# Patient Record
Sex: Female | Born: 1945 | Race: Black or African American | Hispanic: No | Smoking: Never smoker
Health system: Southern US, Community
[De-identification: ages and names within clinical notes are randomized; demographics above are authoritative.]

## PROBLEM LIST (undated history)

## (undated) DIAGNOSIS — I10 Essential (primary) hypertension: Secondary | ICD-10-CM

## (undated) DIAGNOSIS — R519 Headache, unspecified: Secondary | ICD-10-CM

## (undated) DIAGNOSIS — C801 Malignant (primary) neoplasm, unspecified: Secondary | ICD-10-CM

## (undated) DIAGNOSIS — I340 Nonrheumatic mitral (valve) insufficiency: Secondary | ICD-10-CM

## (undated) DIAGNOSIS — E039 Hypothyroidism, unspecified: Secondary | ICD-10-CM

## (undated) DIAGNOSIS — N183 Chronic kidney disease, stage 3 unspecified: Secondary | ICD-10-CM

## (undated) DIAGNOSIS — Z85528 Personal history of other malignant neoplasm of kidney: Secondary | ICD-10-CM

## (undated) DIAGNOSIS — I272 Pulmonary hypertension, unspecified: Secondary | ICD-10-CM

## (undated) DIAGNOSIS — R51 Headache: Secondary | ICD-10-CM

## (undated) DIAGNOSIS — I43 Cardiomyopathy in diseases classified elsewhere: Secondary | ICD-10-CM

## (undated) DIAGNOSIS — R0600 Dyspnea, unspecified: Secondary | ICD-10-CM

## (undated) DIAGNOSIS — E854 Organ-limited amyloidosis: Secondary | ICD-10-CM

## (undated) DIAGNOSIS — I639 Cerebral infarction, unspecified: Secondary | ICD-10-CM

## (undated) DIAGNOSIS — I5042 Chronic combined systolic (congestive) and diastolic (congestive) heart failure: Secondary | ICD-10-CM

## (undated) DIAGNOSIS — Z9889 Other specified postprocedural states: Secondary | ICD-10-CM

## (undated) DIAGNOSIS — M1990 Unspecified osteoarthritis, unspecified site: Secondary | ICD-10-CM

## (undated) DIAGNOSIS — E7800 Pure hypercholesterolemia, unspecified: Secondary | ICD-10-CM

## (undated) HISTORY — DX: Chronic combined systolic (congestive) and diastolic (congestive) heart failure: I50.42

## (undated) HISTORY — DX: Pulmonary hypertension, unspecified: I27.20

## (undated) HISTORY — PX: NEPHRECTOMY: SHX65

## (undated) HISTORY — PX: KNEE ARTHROSCOPY: SUR90

## (undated) HISTORY — PX: BREAST EXCISIONAL BIOPSY: SUR124

## (undated) HISTORY — DX: Organ-limited amyloidosis: I43

## (undated) HISTORY — DX: Other specified postprocedural states: Z98.890

## (undated) HISTORY — DX: Headache: R51

## (undated) HISTORY — DX: Organ-limited amyloidosis: E85.4

## (undated) HISTORY — DX: Nonrheumatic mitral (valve) insufficiency: I34.0

## (undated) HISTORY — DX: Headache, unspecified: R51.9

---

## 1998-12-11 ENCOUNTER — Other Ambulatory Visit: Admission: RE | Admit: 1998-12-11 | Discharge: 1998-12-11 | Payer: Self-pay | Admitting: *Deleted

## 1999-08-09 ENCOUNTER — Emergency Department (HOSPITAL_COMMUNITY): Admission: EM | Admit: 1999-08-09 | Discharge: 1999-08-09 | Payer: Self-pay | Admitting: Emergency Medicine

## 1999-08-09 ENCOUNTER — Encounter: Payer: Self-pay | Admitting: Emergency Medicine

## 1999-12-11 ENCOUNTER — Encounter: Payer: Self-pay | Admitting: Emergency Medicine

## 1999-12-11 ENCOUNTER — Emergency Department (HOSPITAL_COMMUNITY): Admission: EM | Admit: 1999-12-11 | Discharge: 1999-12-11 | Payer: Self-pay | Admitting: Emergency Medicine

## 2001-01-31 ENCOUNTER — Encounter: Admission: RE | Admit: 2001-01-31 | Discharge: 2001-02-17 | Payer: Self-pay | Admitting: Orthopedic Surgery

## 2002-10-12 ENCOUNTER — Emergency Department (HOSPITAL_COMMUNITY): Admission: EM | Admit: 2002-10-12 | Discharge: 2002-10-12 | Payer: Self-pay | Admitting: Emergency Medicine

## 2004-02-01 ENCOUNTER — Inpatient Hospital Stay (HOSPITAL_COMMUNITY): Admission: AD | Admit: 2004-02-01 | Discharge: 2004-02-01 | Payer: Self-pay | Admitting: Obstetrics & Gynecology

## 2005-08-15 ENCOUNTER — Emergency Department (HOSPITAL_COMMUNITY): Admission: EM | Admit: 2005-08-15 | Discharge: 2005-08-16 | Payer: Self-pay | Admitting: Emergency Medicine

## 2005-09-13 ENCOUNTER — Encounter (INDEPENDENT_AMBULATORY_CARE_PROVIDER_SITE_OTHER): Payer: Self-pay | Admitting: Specialist

## 2005-09-13 ENCOUNTER — Ambulatory Visit (HOSPITAL_COMMUNITY): Admission: RE | Admit: 2005-09-13 | Discharge: 2005-09-13 | Payer: Self-pay | Admitting: Gastroenterology

## 2005-09-29 ENCOUNTER — Encounter (INDEPENDENT_AMBULATORY_CARE_PROVIDER_SITE_OTHER): Payer: Self-pay | Admitting: Specialist

## 2005-09-29 ENCOUNTER — Inpatient Hospital Stay (HOSPITAL_COMMUNITY): Admission: RE | Admit: 2005-09-29 | Discharge: 2005-10-01 | Payer: Self-pay | Admitting: Urology

## 2006-09-05 ENCOUNTER — Ambulatory Visit (HOSPITAL_COMMUNITY): Admission: RE | Admit: 2006-09-05 | Discharge: 2006-09-05 | Payer: Self-pay | Admitting: Urology

## 2007-07-23 ENCOUNTER — Emergency Department (HOSPITAL_COMMUNITY): Admission: EM | Admit: 2007-07-23 | Discharge: 2007-07-23 | Payer: Self-pay | Admitting: Emergency Medicine

## 2007-08-18 ENCOUNTER — Emergency Department (HOSPITAL_COMMUNITY): Admission: EM | Admit: 2007-08-18 | Discharge: 2007-08-18 | Payer: Self-pay | Admitting: Emergency Medicine

## 2007-08-31 ENCOUNTER — Other Ambulatory Visit: Admission: RE | Admit: 2007-08-31 | Discharge: 2007-08-31 | Payer: Self-pay | Admitting: Internal Medicine

## 2007-09-07 ENCOUNTER — Ambulatory Visit (HOSPITAL_COMMUNITY): Admission: RE | Admit: 2007-09-07 | Discharge: 2007-09-07 | Payer: Self-pay | Admitting: Urology

## 2007-09-11 ENCOUNTER — Ambulatory Visit (HOSPITAL_COMMUNITY): Admission: RE | Admit: 2007-09-11 | Discharge: 2007-09-11 | Payer: Self-pay | Admitting: Internal Medicine

## 2007-10-12 ENCOUNTER — Ambulatory Visit (HOSPITAL_BASED_OUTPATIENT_CLINIC_OR_DEPARTMENT_OTHER): Admission: RE | Admit: 2007-10-12 | Discharge: 2007-10-12 | Payer: Self-pay | Admitting: Orthopedic Surgery

## 2008-04-01 ENCOUNTER — Emergency Department (HOSPITAL_COMMUNITY): Admission: EM | Admit: 2008-04-01 | Discharge: 2008-04-01 | Payer: Self-pay | Admitting: Family Medicine

## 2008-04-02 ENCOUNTER — Ambulatory Visit: Payer: Self-pay | Admitting: Surgery

## 2008-04-02 ENCOUNTER — Encounter (INDEPENDENT_AMBULATORY_CARE_PROVIDER_SITE_OTHER): Payer: Self-pay | Admitting: Emergency Medicine

## 2008-04-02 ENCOUNTER — Ambulatory Visit (HOSPITAL_COMMUNITY): Admission: RE | Admit: 2008-04-02 | Discharge: 2008-04-02 | Payer: Self-pay | Admitting: Emergency Medicine

## 2008-05-02 ENCOUNTER — Encounter: Admission: RE | Admit: 2008-05-02 | Discharge: 2008-05-02 | Payer: Self-pay | Admitting: Internal Medicine

## 2008-05-17 ENCOUNTER — Ambulatory Visit (HOSPITAL_COMMUNITY): Admission: RE | Admit: 2008-05-17 | Discharge: 2008-05-17 | Payer: Self-pay | Admitting: Internal Medicine

## 2008-09-11 ENCOUNTER — Ambulatory Visit (HOSPITAL_COMMUNITY): Admission: RE | Admit: 2008-09-11 | Discharge: 2008-09-11 | Payer: Self-pay | Admitting: Internal Medicine

## 2008-09-20 ENCOUNTER — Encounter: Admission: RE | Admit: 2008-09-20 | Discharge: 2008-09-20 | Payer: Self-pay | Admitting: Internal Medicine

## 2009-03-07 ENCOUNTER — Encounter: Admission: RE | Admit: 2009-03-07 | Discharge: 2009-03-07 | Payer: Self-pay | Admitting: Internal Medicine

## 2009-06-22 ENCOUNTER — Emergency Department (HOSPITAL_COMMUNITY): Admission: EM | Admit: 2009-06-22 | Discharge: 2009-06-22 | Payer: Self-pay | Admitting: Family Medicine

## 2009-06-22 ENCOUNTER — Emergency Department (HOSPITAL_COMMUNITY): Admission: EM | Admit: 2009-06-22 | Discharge: 2009-06-22 | Payer: Self-pay | Admitting: Emergency Medicine

## 2009-06-27 ENCOUNTER — Emergency Department (HOSPITAL_COMMUNITY): Admission: EM | Admit: 2009-06-27 | Discharge: 2009-06-27 | Payer: Self-pay | Admitting: Emergency Medicine

## 2009-09-04 ENCOUNTER — Ambulatory Visit (HOSPITAL_COMMUNITY): Admission: RE | Admit: 2009-09-04 | Discharge: 2009-09-04 | Payer: Self-pay | Admitting: Urology

## 2009-09-19 ENCOUNTER — Encounter: Admission: RE | Admit: 2009-09-19 | Discharge: 2009-09-19 | Payer: Self-pay | Admitting: Internal Medicine

## 2010-01-07 ENCOUNTER — Emergency Department (HOSPITAL_COMMUNITY): Admission: EM | Admit: 2010-01-07 | Discharge: 2010-01-07 | Payer: Self-pay | Admitting: Family Medicine

## 2010-07-23 ENCOUNTER — Other Ambulatory Visit
Admission: RE | Admit: 2010-07-23 | Discharge: 2010-07-23 | Payer: Self-pay | Source: Home / Self Care | Admitting: Internal Medicine

## 2010-08-20 ENCOUNTER — Other Ambulatory Visit (HOSPITAL_COMMUNITY): Payer: Self-pay | Admitting: Internal Medicine

## 2010-08-20 DIAGNOSIS — Z1231 Encounter for screening mammogram for malignant neoplasm of breast: Secondary | ICD-10-CM

## 2010-09-08 ENCOUNTER — Ambulatory Visit (HOSPITAL_COMMUNITY)
Admission: RE | Admit: 2010-09-08 | Discharge: 2010-09-08 | Disposition: A | Discharge status: Home / Self Care | Payer: Medicare Other | Source: Ambulatory Visit | Attending: Urology | Admitting: Urology

## 2010-09-08 ENCOUNTER — Other Ambulatory Visit (HOSPITAL_COMMUNITY): Payer: Self-pay | Admitting: Urology

## 2010-09-08 DIAGNOSIS — C649 Malignant neoplasm of unspecified kidney, except renal pelvis: Secondary | ICD-10-CM | POA: Insufficient documentation

## 2010-09-21 ENCOUNTER — Ambulatory Visit (HOSPITAL_COMMUNITY)
Admission: RE | Admit: 2010-09-21 | Discharge: 2010-09-21 | Disposition: A | Discharge status: Home / Self Care | Payer: Medicare Other | Source: Ambulatory Visit | Attending: Internal Medicine | Admitting: Internal Medicine

## 2010-09-21 DIAGNOSIS — Z1231 Encounter for screening mammogram for malignant neoplasm of breast: Secondary | ICD-10-CM | POA: Insufficient documentation

## 2010-11-04 LAB — URINALYSIS, ROUTINE W REFLEX MICROSCOPIC
Bilirubin Urine: NEGATIVE
Bilirubin Urine: NEGATIVE
Glucose, UA: NEGATIVE mg/dL
Glucose, UA: NEGATIVE mg/dL
Ketones, ur: NEGATIVE mg/dL
Ketones, ur: NEGATIVE mg/dL
Nitrite: NEGATIVE
Nitrite: NEGATIVE
Protein, ur: NEGATIVE mg/dL
Protein, ur: NEGATIVE mg/dL
Specific Gravity, Urine: 1.013 (ref 1.005–1.030)
Specific Gravity, Urine: 1.016 (ref 1.005–1.030)
Urobilinogen, UA: 0.2 mg/dL (ref 0.0–1.0)
Urobilinogen, UA: 1 mg/dL (ref 0.0–1.0)
pH: 6 (ref 5.0–8.0)
pH: 6.5 (ref 5.0–8.0)

## 2010-11-04 LAB — DIFFERENTIAL
Basophils Absolute: 0 10*3/uL (ref 0.0–0.1)
Basophils Absolute: 0.1 10*3/uL (ref 0.0–0.1)
Basophils Relative: 1 % (ref 0–1)
Basophils Relative: 1 % (ref 0–1)
Eosinophils Absolute: 0 10*3/uL (ref 0.0–0.7)
Eosinophils Absolute: 0 10*3/uL (ref 0.0–0.7)
Eosinophils Relative: 0 % (ref 0–5)
Eosinophils Relative: 0 % (ref 0–5)
Lymphocytes Relative: 28 % (ref 12–46)
Lymphocytes Relative: 36 % (ref 12–46)
Lymphs Abs: 1.6 10*3/uL (ref 0.7–4.0)
Lymphs Abs: 2 10*3/uL (ref 0.7–4.0)
Monocytes Absolute: 0.3 10*3/uL (ref 0.1–1.0)
Monocytes Absolute: 0.3 10*3/uL (ref 0.1–1.0)
Monocytes Relative: 5 % (ref 3–12)
Monocytes Relative: 6 % (ref 3–12)
Neutro Abs: 3.2 10*3/uL (ref 1.7–7.7)
Neutro Abs: 3.7 10*3/uL (ref 1.7–7.7)
Neutrophils Relative %: 58 % (ref 43–77)
Neutrophils Relative %: 65 % (ref 43–77)

## 2010-11-04 LAB — URINE CULTURE: Colony Count: 30000

## 2010-11-04 LAB — CBC
HCT: 42.1 % (ref 36.0–46.0)
HCT: 45.5 % (ref 36.0–46.0)
Hemoglobin: 14.5 g/dL (ref 12.0–15.0)
Hemoglobin: 15.7 g/dL — ABNORMAL HIGH (ref 12.0–15.0)
MCHC: 34.4 g/dL (ref 30.0–36.0)
MCHC: 34.5 g/dL (ref 30.0–36.0)
MCV: 91.2 fL (ref 78.0–100.0)
MCV: 91.4 fL (ref 78.0–100.0)
Platelets: 230 10*3/uL (ref 150–400)
Platelets: 306 10*3/uL (ref 150–400)
RBC: 4.62 MIL/uL (ref 3.87–5.11)
RBC: 4.98 MIL/uL (ref 3.87–5.11)
RDW: 13.3 % (ref 11.5–15.5)
RDW: 13.4 % (ref 11.5–15.5)
WBC: 5.6 10*3/uL (ref 4.0–10.5)
WBC: 5.7 10*3/uL (ref 4.0–10.5)

## 2010-11-04 LAB — COMPREHENSIVE METABOLIC PANEL
ALT: 14 U/L (ref 0–35)
ALT: 16 U/L (ref 0–35)
AST: 19 U/L (ref 0–37)
AST: 19 U/L (ref 0–37)
Albumin: 3.9 g/dL (ref 3.5–5.2)
Albumin: 4.3 g/dL (ref 3.5–5.2)
Alkaline Phosphatase: 102 U/L (ref 39–117)
Alkaline Phosphatase: 95 U/L (ref 39–117)
BUN: 13 mg/dL (ref 6–23)
BUN: 14 mg/dL (ref 6–23)
CO2: 26 mEq/L (ref 19–32)
CO2: 29 mEq/L (ref 19–32)
Calcium: 9.4 mg/dL (ref 8.4–10.5)
Calcium: 9.4 mg/dL (ref 8.4–10.5)
Chloride: 106 mEq/L (ref 96–112)
Chloride: 108 mEq/L (ref 96–112)
Creatinine, Ser: 1.29 mg/dL — ABNORMAL HIGH (ref 0.4–1.2)
Creatinine, Ser: 1.38 mg/dL — ABNORMAL HIGH (ref 0.4–1.2)
GFR calc Af Amer: 47 mL/min — ABNORMAL LOW (ref >60)
GFR calc Af Amer: 50 mL/min — ABNORMAL LOW (ref >60)
GFR calc non Af Amer: 38 mL/min — ABNORMAL LOW (ref >60)
GFR calc non Af Amer: 42 mL/min — ABNORMAL LOW (ref >60)
Glucose, Bld: 86 mg/dL (ref 70–99)
Glucose, Bld: 96 mg/dL (ref 70–99)
Potassium: 3.6 mEq/L (ref 3.5–5.1)
Potassium: 4.3 mEq/L (ref 3.5–5.1)
Sodium: 139 mEq/L (ref 135–145)
Sodium: 142 mEq/L (ref 135–145)
Total Bilirubin: 1 mg/dL (ref 0.3–1.2)
Total Bilirubin: 1.1 mg/dL (ref 0.3–1.2)
Total Protein: 7.2 g/dL (ref 6.0–8.3)
Total Protein: 7.7 g/dL (ref 6.0–8.3)

## 2010-11-04 LAB — URINE MICROSCOPIC-ADD ON

## 2010-11-04 LAB — POCT URINALYSIS DIP (DEVICE)
Bilirubin Urine: NEGATIVE
Glucose, UA: NEGATIVE mg/dL
Ketones, ur: NEGATIVE mg/dL
Nitrite: NEGATIVE
Protein, ur: NEGATIVE mg/dL
Specific Gravity, Urine: 1.015 (ref 1.005–1.030)
Urobilinogen, UA: 0.2 mg/dL (ref 0.0–1.0)
pH: 5.5 (ref 5.0–8.0)

## 2010-11-04 LAB — LIPASE, BLOOD
Lipase: 34 U/L (ref 11–59)
Lipase: 42 U/L (ref 11–59)

## 2010-12-15 NOTE — Op Note (Signed)
[name], [name]                  ACCOUNT NO.:  [id number]   MEDICAL RECORD NO.:  [id number]          PATIENT TYPE:  AMB   LOCATION:  [place]                          FACILITY:  [facility]   PHYSICIAN:  Rodney A. Mortenson, M.D.DATE OF BIRTH:  [birth year]   DATE OF PROCEDURE:  10/12/2007  DATE OF DISCHARGE:                               OPERATIVE REPORT   INDICATIONS FOR PROCEDURE:  A 65 year old female with typical carpal  tunnel syndrome on the right and positive prolonged nerve conduction  studies.  Now admitted for release of carpal tunnel.  Questions were  answered and encouraged preoperatively.  Complications discussed.  The  patient wished to proceed with surgery.   PREOPERATIVE DIAGNOSIS:  Right carpal tunnel.   POSTOPERATIVE DIAGNOSIS:  Right carpal tunnel.   OPERATION:  Release of right carpal tunnel.   SURGEON:  Geroge Baseman. Alphonzo Cruise, M.D.   ANESTHESIA:  MAC.   PROCEDURE IN DETAIL:  The patient was placed on the operating table in a  supine position.  Pneumatic tourniquet was applied to the right upper  arm.  The right upper extremity was prepped with DuraPrep and draped out  in the usual manner.  The area of incision was infiltrated with a  combination of Marcaine and Xylocaine and then the hand and arm was  wrapped out with an Esmarch and tourniquet was elevated.  A lazy S  incision was made starting at mid palmar space and carried proximal to  the volar wrist crease.  The fascia in the forearm was then opened and  the median nerve was identified and isolated.  A curved Mayo scissors  was placed underneath the transverse carpal ligament but above the  median nerve to protect the median nerve.  Under direct vision an  incision was made through the transverse ligament starting proximally  and distally and a complete decompression of the transverse ligament was  achieved very nicely.  The carpal tunnel was then evaluated.  No other  space occupying lesions were seen.  All the  tendons and nerve appeared  normal.  There was no flattening of the nerve underneath the transverse  ligament and there was some loss of the vascular markings.  Skin edges  were then closed with interrupted 4-0 nylon sutures.  Large bulky  pressure dressing and wrist immobilizer applied and the patient returned  to recovery room in excellent condition.  Technically I was very pleased  with the surgical outcome.   DISPOSITION:  1. Return to my office on Monday.  2. Percocet for pain.      Rodney A. Alphonzo Cruise, M.D.  Electronically Signed    RAM/MEDQ  D:  10/12/2007  T:  10/13/2007  Job:  [id number]

## 2010-12-18 NOTE — Discharge Summary (Signed)
NAMEMarland [name]  SOFIA, MARCIN                  ACCOUNT NO.:  [id number]   MEDICAL RECORD NO.:  [id number]          PATIENT TYPE:  INP   LOCATION:  62                         FACILITY:  [facility]   PHYSICIAN:  Mark C. Karsten Ro, M.D.  DATE OF BIRTH:  1945/09/26   DATE OF ADMISSION:  09/29/2005  DATE OF DISCHARGE:  10/01/2005                                 DISCHARGE SUMMARY   PRINCIPAL DIAGNOSIS:  Right renal mass poor.   OTHER DIAGNOSES:  None.   MAJOR OPERATION:  Right radical nephrectomy.   DISPOSITION:  The patient is discharged home in a stable, satisfactory and  improved condition. She is tolerating a regular diet, afebrile and her  incision appears to be healing well without any signs of infection or  discharge.   DISCHARGE MEDICATIONS:  1.  Tylox, 1-2 q.4 h p.r.n. pain #40.  2.  Colace 100 mg b.i.d.   DIET:  Unrestricted.   ACTIVITY:  Will be limited to no heavy lifting, straining and no driving,  avoiding up and down multiple stairs and primarily just activity of daily  living.   FOLLOW-UP:  Will be in my office in 1 week for skin staple removal and  creatinine check.   BRIEF HISTORY:  The patient is a 65 year old black female who had  incidentally identified 4-1/2 cm hypervascular right renal mass on CT scan.  She had normal contralateral renal function. The lesion appeared to involve  a significant portion of the kidney necessitating total nephrectomy rather  than a partial nephrectomy. She was admitted for that procedure at this  time. Her full history and physical is noted in her hospital chart will not  be redictated here.   HOSPITAL COURSE:  On September 29, 2005, she was taken to the operating room  and underwent a right radical nephrectomy without complication. She did well  over the evening and the following day was noted to be without complaint.  She was afebrile, her creatinine was noted to be slightly elevated at 1.6,  hemoglobin 11.7, hematocrit 34.7. Her Foley  catheter was removed. She was  begun on ambulation and her diet was advanced. By the following day, she was  tolerating a regular diet, had no complaint of any significant pain. Her  incision was healing well without any sign of infection and was felt ready  for discharge.   The pathology report remains pending at the time of discharge. She will be  followed up in my office as noted above. I will discuss pathology results  with her at that time.      Mark C. Karsten Ro, M.D.  Electronically Signed     MCO/MEDQ  D:  10/01/2005  T:  10/01/2005  Job:  [id number]

## 2010-12-18 NOTE — Op Note (Signed)
NAMEMarland Kitchen  Katie, [name]                  ACCOUNT NO.:  [id number]   MEDICAL RECORD NO.:  [id number]          PATIENT TYPE:  INP   LOCATION:  30                         FACILITY:  [facility]   PHYSICIAN:  Mark C. Karsten Ro, M.D.  DATE OF BIRTH:  1946-04-30   DATE OF PROCEDURE:  09/29/2005  DATE OF DISCHARGE:                                 OPERATIVE REPORT   PREOPERATIVE DIAGNOSIS:  Right renal mass.   POSTOPERATIVE DIAGNOSIS:  Right renal mass.   PROCEDURE:  Right radical nephrectomy.   SURGEON:  Mark C. Karsten Ro, M.D.   ASSISTANT:  Guillermina City, M.D.   ANESTHESIA:  General endotracheal.   SPECIMENS:  Kidney with partial ureterectomy and surrounding Gerota's  fascia.   DRAINS:  None.   PROCEDURE:  The patient was identified by a wrist bracelet and brought to  room 4 where she received preprocedure antibiotics and was administered  general anesthesia. She was prepped and draped in the usual sterile fashion  taking great care to minimize the risk of peripheral neuropathy or  compartment syndrome. Next, we palpated her right costal margin, palpated  the eleventh rib as well as the costal margin. As noted on preoperative  imaging, the patient had a very short twelfth free rib. It was then  determined to make an incision off the tip of this rib towards her umbilicus  approximately 20 cm in length. We carried down with the knife to the fascial  sheath. Bovie cautery was used to control hemostasis. We then opened the  fascia and the muscle body and the external oblique. We continued down  opening the fascia and muscle body and the internal oblique and then bluntly  split the transversus abdominis. We then set our Bookwalter retractor. We  then bluntly dissected the peritoneum medially to expose Gerota's fascia. We  then dissected a combination of blunt and sharp dissection posteriorly down  onto the psoas musculature. Next, using a combination of blunt and sharp  dissection, we dissected  Gerota's fascia inferiorly. We isolated the ureter,  it was ligated with metal clips and divided sharply. Next, we followed the  ureter up medially towards the hilum, we palpated the hilum. We were able to  identify the artery and vein. Next, we ligated and divided each structure  ligating and dividing the artery first. We placed one #0 tie distal and one  #0 silk tie and two large metal clips proximal on both the artery and the  vein and they were then divided. We then noted the kidney softened markedly  and turned blue. There were no other hilar structures appreciated. We did  then free the superior pole leaving an adequate amount of Gerota's fascia.  This was done with a combination of blunt and sharp dissection using short  metal clips and Bovie cautery to ligate any small vessels in Gerota's  fascia. We next removed the specimen from the field. We copiously irrigated  the wound, we inspected the hilum and it was noted to be markedly  hemostatic. There were no bleeding points. We did trace the gonadal which we  did not ligate or divide. It was noted to enter in to the vena cava inferior  to where we divided the ureter. Next, we took our retractor system down. We  took the flex and the kidney rest down on the bed. We closed the wound in 3  layers closing transversus and the  internal oblique with a running #1 PDS. We then closed the external oblique  with a running #1 PDS, skin staples were applied. The patient was reversed  from her anesthesia which she tolerated without complications. Please note  that Dr. Kathie Rhodes was present and participated in all aspects of this  case.     ______________________________  Guillermina City, MD      Thana Farr. Karsten Ro, M.D.  Electronically Signed    MT/MEDQ  D:  09/29/2005  T:  09/30/2005  Job:  [id number]

## 2011-04-26 LAB — POCT I-STAT, CHEM 8
BUN: 14
Calcium, Ion: 1.2
Chloride: 107
Creatinine, Ser: 1.4 — ABNORMAL HIGH
Glucose, Bld: 100 — ABNORMAL HIGH
HCT: 44
Hemoglobin: 15
Potassium: 4
Sodium: 144
TCO2: 26

## 2011-05-07 LAB — URINALYSIS, ROUTINE W REFLEX MICROSCOPIC
Bilirubin Urine: NEGATIVE
Glucose, UA: NEGATIVE
Ketones, ur: NEGATIVE
Nitrite: NEGATIVE
Protein, ur: NEGATIVE
Specific Gravity, Urine: 1.019
Urobilinogen, UA: 0.2
pH: 5.5

## 2011-05-07 LAB — URINE MICROSCOPIC-ADD ON

## 2011-08-16 ENCOUNTER — Other Ambulatory Visit (HOSPITAL_COMMUNITY): Payer: Self-pay | Admitting: Internal Medicine

## 2011-08-16 DIAGNOSIS — Z1231 Encounter for screening mammogram for malignant neoplasm of breast: Secondary | ICD-10-CM

## 2011-09-16 ENCOUNTER — Ambulatory Visit (HOSPITAL_COMMUNITY)
Admission: RE | Admit: 2011-09-16 | Discharge: 2011-09-16 | Disposition: A | Discharge status: Home / Self Care | Payer: Medicare Other | Source: Ambulatory Visit | Attending: Urology | Admitting: Urology

## 2011-09-16 ENCOUNTER — Other Ambulatory Visit (HOSPITAL_COMMUNITY): Payer: Self-pay | Admitting: Urology

## 2011-09-16 DIAGNOSIS — Z85528 Personal history of other malignant neoplasm of kidney: Secondary | ICD-10-CM

## 2011-09-23 ENCOUNTER — Ambulatory Visit (HOSPITAL_COMMUNITY)
Admission: RE | Admit: 2011-09-23 | Discharge: 2011-09-23 | Disposition: A | Discharge status: Home / Self Care | Payer: Medicare Other | Source: Ambulatory Visit | Attending: Internal Medicine | Admitting: Internal Medicine

## 2011-09-23 DIAGNOSIS — Z1231 Encounter for screening mammogram for malignant neoplasm of breast: Secondary | ICD-10-CM | POA: Insufficient documentation

## 2012-08-30 ENCOUNTER — Other Ambulatory Visit (HOSPITAL_COMMUNITY): Payer: Self-pay | Admitting: Internal Medicine

## 2012-08-30 DIAGNOSIS — Z1231 Encounter for screening mammogram for malignant neoplasm of breast: Secondary | ICD-10-CM

## 2012-09-19 ENCOUNTER — Other Ambulatory Visit (HOSPITAL_COMMUNITY): Payer: Self-pay | Admitting: Urology

## 2012-09-19 ENCOUNTER — Ambulatory Visit (HOSPITAL_COMMUNITY)
Admission: RE | Admit: 2012-09-19 | Discharge: 2012-09-19 | Disposition: A | Discharge status: Home / Self Care | Payer: Medicare Other | Source: Ambulatory Visit | Attending: Urology | Admitting: Urology

## 2012-09-19 DIAGNOSIS — Z85528 Personal history of other malignant neoplasm of kidney: Secondary | ICD-10-CM

## 2012-09-19 DIAGNOSIS — J984 Other disorders of lung: Secondary | ICD-10-CM | POA: Insufficient documentation

## 2012-09-19 DIAGNOSIS — D4959 Neoplasm of unspecified behavior of other genitourinary organ: Secondary | ICD-10-CM | POA: Insufficient documentation

## 2012-09-25 ENCOUNTER — Ambulatory Visit (HOSPITAL_COMMUNITY)
Admission: RE | Admit: 2012-09-25 | Discharge: 2012-09-25 | Disposition: A | Discharge status: Home / Self Care | Payer: Medicare Other | Source: Ambulatory Visit | Attending: Internal Medicine | Admitting: Internal Medicine

## 2012-09-25 DIAGNOSIS — Z1231 Encounter for screening mammogram for malignant neoplasm of breast: Secondary | ICD-10-CM | POA: Insufficient documentation

## 2013-02-19 ENCOUNTER — Other Ambulatory Visit (HOSPITAL_COMMUNITY)
Admission: RE | Admit: 2013-02-19 | Discharge: 2013-02-19 | Disposition: A | Discharge status: Home / Self Care | Payer: Medicare Other | Source: Ambulatory Visit | Attending: Internal Medicine | Admitting: Internal Medicine

## 2013-02-19 ENCOUNTER — Other Ambulatory Visit: Payer: Self-pay | Admitting: Internal Medicine

## 2013-02-19 DIAGNOSIS — Z01419 Encounter for gynecological examination (general) (routine) without abnormal findings: Secondary | ICD-10-CM | POA: Insufficient documentation

## 2013-03-16 ENCOUNTER — Other Ambulatory Visit: Payer: Self-pay | Admitting: Obstetrics and Gynecology

## 2013-04-03 ENCOUNTER — Encounter (HOSPITAL_COMMUNITY): Payer: Self-pay | Admitting: Pharmacist

## 2013-04-17 ENCOUNTER — Encounter (HOSPITAL_COMMUNITY)
Admission: RE | Admit: 2013-04-17 | Discharge: 2013-04-17 | Disposition: A | Discharge status: Home / Self Care | Payer: Medicare Other | Source: Ambulatory Visit | Attending: Obstetrics and Gynecology | Admitting: Obstetrics and Gynecology

## 2013-04-17 ENCOUNTER — Encounter (HOSPITAL_COMMUNITY): Payer: Self-pay

## 2013-04-17 DIAGNOSIS — Z01818 Encounter for other preprocedural examination: Secondary | ICD-10-CM | POA: Insufficient documentation

## 2013-04-17 DIAGNOSIS — Z01810 Encounter for preprocedural cardiovascular examination: Secondary | ICD-10-CM | POA: Insufficient documentation

## 2013-04-17 DIAGNOSIS — Z01812 Encounter for preprocedural laboratory examination: Secondary | ICD-10-CM | POA: Insufficient documentation

## 2013-04-17 LAB — CBC
HCT: 41.8 % (ref 36.0–46.0)
Hemoglobin: 14.3 g/dL (ref 12.0–15.0)
MCH: 30.7 pg (ref 26.0–34.0)
MCHC: 34.2 g/dL (ref 30.0–36.0)
MCV: 89.7 fL (ref 78.0–100.0)
Platelets: 279 10*3/uL (ref 150–400)
RBC: 4.66 MIL/uL (ref 3.87–5.11)
RDW: 13.6 % (ref 11.5–15.5)
WBC: 6.8 10*3/uL (ref 4.0–10.5)

## 2013-04-17 NOTE — Pre-Procedure Instructions (Signed)
EKG of today (04/17/13) reviewed and accepted by Rudean Curt, M.D.

## 2013-04-17 NOTE — Patient Instructions (Addendum)
20 [name]  04/17/2013   Your procedure is scheduled on:  04/23/13  Enter through the Main Entrance of [facility] at [place] up the phone at the desk and dial [phone].   Call this number if you have problems the morning of surgery: [phone]   Remember:   Do not eat food:After Midnight.  Do not drink clear liquids: 4 Hours before arrival.  Take these medicines the morning of surgery with A SIP OF WATER: NA   Do not wear jewelry, make-up or nail polish.  Do not wear lotions, powders, or perfumes. You may wear deodorant.  Do not shave 48 hours prior to surgery.  Do not bring valuables to the hospital.  [facility] is not   responsible for any belongings or valuables brought to the hospital.  Contacts, dentures or bridgework may not be worn into surgery.  Leave suitcase in the car. After surgery it may be brought to your room.  For patients admitted to the hospital, checkout time is 11:00 AM the day of              discharge.   Patients discharged the day of surgery will not be allowed to drive             home.  Name and phone number of your driver: sister   Katie [name]  Special Instructions:   Shower using CHG 2 nights before surgery and the night before surgery.  If you shower the day of surgery use CHG.  Use special wash - you have one bottle of CHG for all showers.  You should use approximately 1/3 of the bottle for each shower.   Please read over the following fact sheets that you were given:   Surgical Site Infection Prevention

## 2013-04-23 ENCOUNTER — Encounter (HOSPITAL_COMMUNITY): Payer: Self-pay | Admitting: *Deleted

## 2013-04-23 ENCOUNTER — Encounter (HOSPITAL_COMMUNITY): Payer: Self-pay | Admitting: Anesthesiology

## 2013-04-23 ENCOUNTER — Other Ambulatory Visit: Payer: Self-pay | Admitting: Obstetrics and Gynecology

## 2013-04-23 ENCOUNTER — Ambulatory Visit (HOSPITAL_COMMUNITY): Payer: Medicare Other | Admitting: Anesthesiology

## 2013-04-23 ENCOUNTER — Ambulatory Visit (HOSPITAL_COMMUNITY)
Admission: RE | Admit: 2013-04-23 | Discharge: 2013-04-23 | Disposition: A | Discharge status: Home / Self Care | Payer: Medicare Other | Source: Ambulatory Visit | Attending: Obstetrics and Gynecology | Admitting: Obstetrics and Gynecology

## 2013-04-23 ENCOUNTER — Encounter (HOSPITAL_COMMUNITY)
Admission: RE | Disposition: A | Discharge status: Home / Self Care | Payer: Self-pay | Source: Ambulatory Visit | Attending: Obstetrics and Gynecology

## 2013-04-23 DIAGNOSIS — N950 Postmenopausal bleeding: Secondary | ICD-10-CM | POA: Insufficient documentation

## 2013-04-23 HISTORY — PX: HYSTEROSCOPY W/D&C: SHX1775

## 2013-04-23 HISTORY — PX: CERVICAL POLYPECTOMY: SHX88

## 2013-04-23 LAB — BASIC METABOLIC PANEL
BUN: 17 mg/dL (ref 6–23)
CO2: 25 mEq/L (ref 19–32)
Calcium: 10.3 mg/dL (ref 8.4–10.5)
Chloride: 106 mEq/L (ref 96–112)
Creatinine, Ser: 1.36 mg/dL — ABNORMAL HIGH (ref 0.50–1.10)
GFR calc Af Amer: 46 mL/min — ABNORMAL LOW (ref >90)
GFR calc non Af Amer: 39 mL/min — ABNORMAL LOW (ref >90)
Glucose, Bld: 85 mg/dL (ref 70–99)
Potassium: 4.1 mEq/L (ref 3.5–5.1)
Sodium: 141 mEq/L (ref 135–145)

## 2013-04-23 SURGERY — DILATATION AND CURETTAGE /HYSTEROSCOPY
Anesthesia: General | Site: Uterus | Wound class: Clean Contaminated

## 2013-04-23 MED ORDER — MIDAZOLAM HCL 2 MG/2ML IJ SOLN
INTRAMUSCULAR | Status: AC
  Filled 2013-04-23: qty 2

## 2013-04-23 MED ORDER — FENTANYL CITRATE 0.05 MG/ML IJ SOLN
INTRAMUSCULAR | Status: DC | PRN
  Administered 2013-04-23 (×2): 50 ug via INTRAVENOUS

## 2013-04-23 MED ORDER — GLYCINE 1.5 % IR SOLN
Status: DC | PRN
  Administered 2013-04-23: 3000 mL

## 2013-04-23 MED ORDER — DOXYCYCLINE HYCLATE 50 MG PO CAPS: 100.0000 mg | ORAL_CAPSULE | Freq: Every day | ORAL | Status: DC

## 2013-04-23 MED ORDER — FENTANYL CITRATE 0.05 MG/ML IJ SOLN
INTRAMUSCULAR | Status: AC
  Filled 2013-04-23: qty 2

## 2013-04-23 MED ORDER — PROMETHAZINE HCL 25 MG/ML IJ SOLN: 6.2500 mg | INTRAMUSCULAR | Status: DC | PRN

## 2013-04-23 MED ORDER — KETOROLAC TROMETHAMINE 30 MG/ML IJ SOLN
INTRAMUSCULAR | Status: AC
  Filled 2013-04-23: qty 1

## 2013-04-23 MED ORDER — PROPOFOL 10 MG/ML IV EMUL
INTRAVENOUS | Status: AC
  Filled 2013-04-23: qty 20

## 2013-04-23 MED ORDER — MIDAZOLAM HCL 2 MG/2ML IJ SOLN: 0.5000 mg | Freq: Once | INTRAMUSCULAR | Status: DC | PRN

## 2013-04-23 MED ORDER — LIDOCAINE HCL (CARDIAC) 20 MG/ML IV SOLN
INTRAVENOUS | Status: DC | PRN
  Administered 2013-04-23: 50 mg via INTRAVENOUS

## 2013-04-23 MED ORDER — ONDANSETRON HCL 4 MG/2ML IJ SOLN
INTRAMUSCULAR | Status: AC
  Filled 2013-04-23: qty 2

## 2013-04-23 MED ORDER — LIDOCAINE HCL (CARDIAC) 20 MG/ML IV SOLN
INTRAVENOUS | Status: AC
  Filled 2013-04-23: qty 5

## 2013-04-23 MED ORDER — ONDANSETRON HCL 4 MG/2ML IJ SOLN
INTRAMUSCULAR | Status: DC | PRN
  Administered 2013-04-23: 4 mg via INTRAVENOUS

## 2013-04-23 MED ORDER — MIDAZOLAM HCL 5 MG/5ML IJ SOLN
INTRAMUSCULAR | Status: DC | PRN
  Administered 2013-04-23: 2 mg via INTRAVENOUS

## 2013-04-23 MED ORDER — PROPOFOL 10 MG/ML IV BOLUS
INTRAVENOUS | Status: DC | PRN
  Administered 2013-04-23: 170 mg via INTRAVENOUS

## 2013-04-23 MED ORDER — BUPIVACAINE HCL (PF) 0.25 % IJ SOLN
INTRAMUSCULAR | Status: AC
  Filled 2013-04-23: qty 30

## 2013-04-23 MED ORDER — HYDROCODONE-ACETAMINOPHEN 5-325 MG PO TABS: ORAL_TABLET | ORAL | Status: DC

## 2013-04-23 MED ORDER — IBUPROFEN 600 MG PO TABS: 600.0000 mg | ORAL_TABLET | Freq: Four times a day (QID) | ORAL | Status: DC | PRN

## 2013-04-23 MED ORDER — BUPIVACAINE HCL (PF) 0.25 % IJ SOLN
INTRAMUSCULAR | Status: DC | PRN
  Administered 2013-04-23: 30 mL

## 2013-04-23 MED ORDER — KETOROLAC TROMETHAMINE 30 MG/ML IJ SOLN
INTRAMUSCULAR | Status: DC | PRN
  Administered 2013-04-23: 30 mg via INTRAVENOUS

## 2013-04-23 MED ORDER — MEPERIDINE HCL 25 MG/ML IJ SOLN: 6.2500 mg | INTRAMUSCULAR | Status: DC | PRN

## 2013-04-23 MED ORDER — FENTANYL CITRATE 0.05 MG/ML IJ SOLN: 25.0000 ug | INTRAMUSCULAR | Status: DC | PRN

## 2013-04-23 MED ORDER — DEXAMETHASONE SODIUM PHOSPHATE 10 MG/ML IJ SOLN
INTRAMUSCULAR | Status: AC
  Filled 2013-04-23: qty 1

## 2013-04-23 MED ORDER — DEXAMETHASONE SODIUM PHOSPHATE 10 MG/ML IJ SOLN
INTRAMUSCULAR | Status: DC | PRN
  Administered 2013-04-23: 10 mg via INTRAVENOUS

## 2013-04-23 MED ORDER — LACTATED RINGERS IV SOLN
INTRAVENOUS | Status: DC
  Administered 2013-04-23: 50 mL/h via INTRAVENOUS

## 2013-04-23 SURGICAL SUPPLY — 21 items
CANISTER SUCTION 2500CC (MISCELLANEOUS) ×3 IMPLANT
CATH ROBINSON RED A/P 16FR (CATHETERS) ×3 IMPLANT
CLOTH BEACON ORANGE TIMEOUT ST (SAFETY) ×3 IMPLANT
CONTAINER PREFILL 10% NBF 60ML (FORM) ×6 IMPLANT
DRESSING TELFA 8X3 (GAUZE/BANDAGES/DRESSINGS) ×3 IMPLANT
ELECT REM PT RETURN 9FT ADLT (ELECTROSURGICAL) ×3
ELECTRODE REM PT RTRN 9FT ADLT (ELECTROSURGICAL) ×2 IMPLANT
GLOVE BIOGEL M 6.5 STRL (GLOVE) ×6 IMPLANT
GLOVE BIOGEL PI IND STRL 6.5 (GLOVE) ×2 IMPLANT
GLOVE BIOGEL PI INDICATOR 6.5 (GLOVE) ×1
GLOVE INDICATOR 7.0 STRL GRN (GLOVE) ×3 IMPLANT
GLOVE NEODERM STER SZ 7 (GLOVE) ×3 IMPLANT
GOWN PREVENTION PLUS XLARGE (GOWN DISPOSABLE) ×3 IMPLANT
GOWN STRL REIN XL XLG (GOWN DISPOSABLE) ×6 IMPLANT
LOOP ANGLED CUTTING 22FR (CUTTING LOOP) ×3 IMPLANT
PACK HYSTEROSCOPY LF (CUSTOM PROCEDURE TRAY) ×3 IMPLANT
PAD OB MATERNITY 4.3X12.25 (PERSONAL CARE ITEMS) ×3 IMPLANT
SUT VIC AB 2-0 SH 27 (SUTURE) ×1
SUT VIC AB 2-0 SH 27XBRD (SUTURE) ×2 IMPLANT
TOWEL OR 17X24 6PK STRL BLUE (TOWEL DISPOSABLE) ×6 IMPLANT
WATER STERILE IRR 1000ML POUR (IV SOLUTION) ×3 IMPLANT

## 2013-04-23 NOTE — Transfer of Care (Signed)
Immediate Anesthesia Transfer of Care Note  Patient: Katie [name]  Procedure(s) Performed: Procedure(s) with comments: DILATATION AND CURETTAGE /HYSTEROSCOPY (N/A) CERVICAL POLYPECTOMY (N/A) - Possible Polypectomy  Patient Location: PACU  Anesthesia Type:General  Level of Consciousness: awake and alert   Airway & Oxygen Therapy: Patient Spontanous Breathing and Patient connected to nasal cannula oxygen  Post-op Assessment: Report given to PACU RN  Post vital signs: Reviewed  Complications: No apparent anesthesia complications

## 2013-04-23 NOTE — H&P (Signed)
Date of Initial H&P: 03/30/2013 History reviewed, patient examined, no change in status, stable for surgery.

## 2013-04-23 NOTE — Anesthesia Postprocedure Evaluation (Signed)
Anesthesia Post Note  Patient: Katie [name]  Procedure(s) Performed: Procedure(s) (LRB): DILATATION AND CURETTAGE /HYSTEROSCOPY (N/A) CERVICAL POLYPECTOMY (N/A)  Anesthesia type: GA  Patient location: PACU  Post pain: Pain level controlled  Post assessment: Post-op Vital signs reviewed  Last Vitals:  Filed Vitals:   04/23/13 1345  BP: 120/72  Pulse: 96  Temp:   Resp: 14    Post vital signs: Reviewed  Level of consciousness: sedated  Complications: No apparent anesthesia complications

## 2013-04-23 NOTE — Preoperative (Signed)
Beta Blockers   Reason not to administer Beta Blockers:Not Applicable 

## 2013-04-23 NOTE — Anesthesia Preprocedure Evaluation (Signed)
Anesthesia Evaluation  Patient identified by MRN, date of birth, ID band Patient awake    Reviewed: Allergy & Precautions, H&P , Patient's Chart, lab work & pertinent test results, reviewed documented beta blocker date and time   History of Anesthesia Complications Negative for: history of anesthetic complications  Airway Mallampati: II TM Distance: >3 FB Neck ROM: full    Dental no notable dental hx.    Pulmonary neg pulmonary ROS,  breath sounds clear to auscultation  Pulmonary exam normal       Cardiovascular Exercise Tolerance: Good negative cardio ROS  Rhythm:regular Rate:Normal     Neuro/Psych negative neurological ROS  negative psych ROS   GI/Hepatic negative GI ROS, Neg liver ROS,   Endo/Other  negative endocrine ROS  Renal/GU Renal diseasenegative Renal ROS     Musculoskeletal   Abdominal   Peds  Hematology negative hematology ROS (+)   Anesthesia Other Findings   Reproductive/Obstetrics negative OB ROS                           Anesthesia Physical Anesthesia Plan  ASA: II  Anesthesia Plan: General LMA   Post-op Pain Management:    Induction:   Airway Management Planned:   Additional Equipment:   Intra-op Plan:   Post-operative Plan:   Informed Consent: I have reviewed the patients History and Physical, chart, labs and discussed the procedure including the risks, benefits and alternatives for the proposed anesthesia with the patient or authorized representative who has indicated his/her understanding and acceptance.   Dental Advisory Given  Plan Discussed with: CRNA, Surgeon and Anesthesiologist  Anesthesia Plan Comments:         Anesthesia Quick Evaluation

## 2013-04-23 NOTE — Op Note (Signed)
04/23/2013  1:33 PM  PATIENT:  Katie [name]  67 y.o. female  PRE-OPERATIVE DIAGNOSIS:  PMB CPT [phone]  POST-OPERATIVE DIAGNOSIS:  post menopausal bleeding  PROCEDURE:  Procedure(s) with comments: DILATATION AND CURETTAGE /HYSTEROSCOPY (N/A) CERVICAL POLYPECTOMY (N/A) - Possible Polypectomy     SURGEON:  Surgeon(s) and Role:    * Delyla Sandeen J. Landry Mellow, MD - Primary  PHYSICIAN ASSISTANT: None  ASSISTANTS: none   ANESTHESIA:   general  EBL:  Total I/O In: 800 [I.V.:800] Out: 31 [Urine:50; Blood:5]  Findings : small rectocele and cystocele... Proliferative appearing endometrium with polypoid appearance no distinct large polyp was seen    BLOOD ADMINISTERED:none  DRAINS: none   LOCAL MEDICATIONS USED:  MARCAINE   20 cc   SPECIMEN:  Source of Specimen:  endometrial currettings   DISPOSITION OF SPECIMEN:  PATHOLOGY  COUNTS:  YES  TOURNIQUET:  * No tourniquets in log *  DICTATION: .Dragon Dictation  PLAN OF CARE: Discharge to home after PACU  PATIENT DISPOSITION:  PACU - hemodynamically stable.   Delay start of Pharmacological VTE agent (>24hrs) due to surgical blood loss or risk of bleeding: not applicable   Procedure: Patient was taken to the operating room where she was placed under general anesthesia. She was placed in the dorsal lithotomy position. She was prepped and draped in the usual sterile fashion. A speculum was placed into the vaginal vault. The anterior lip of the cervix was grasped with a single-tooth tenaculum. Quarter percent Marcaine was injected at the 4 and 8:00 positions of the cervix. Pt was noted to have brisk bleeding from the 4 oclock position. A 2-0 vicryl suture was placed and excellent anesthesia was noted.  The cervix was then sounded to 7 cm. The cervix was dilated to approximately 4 mm. Diagnostic hysteroscope was inserted. The findings noted above. The hysteroscope was removed.  A Sharp curette was introduced and endometrial curettings were  obtained. The hysteroscope was then reinserted. There was no evidence of endometrial polyps or masses with reinsertion of the hysteroscope. There was no evidence of perforation. Hysteroscope was then removed. The single-tooth tenaculum was removed from the anterior lip of the cervix. Excellent hemostasis was noted. The speculum was removed from the patient's vagina. She was awakened from anesthesia taken care to the recovery room awake and in stable condition. Sponge lap and needle counts were correct x2.  Glycine deficit was 50 cc.

## 2013-04-24 ENCOUNTER — Encounter (HOSPITAL_COMMUNITY): Payer: Self-pay | Admitting: Obstetrics and Gynecology

## 2013-06-11 ENCOUNTER — Other Ambulatory Visit (HOSPITAL_COMMUNITY): Payer: Self-pay | Admitting: Sports Medicine

## 2013-06-11 DIAGNOSIS — M5410 Radiculopathy, site unspecified: Secondary | ICD-10-CM

## 2013-06-11 DIAGNOSIS — M545 Low back pain, unspecified: Secondary | ICD-10-CM

## 2013-08-21 ENCOUNTER — Other Ambulatory Visit (HOSPITAL_COMMUNITY): Payer: Self-pay | Admitting: Internal Medicine

## 2013-08-21 DIAGNOSIS — Z1231 Encounter for screening mammogram for malignant neoplasm of breast: Secondary | ICD-10-CM

## 2013-09-26 ENCOUNTER — Ambulatory Visit (HOSPITAL_COMMUNITY)
Admission: RE | Admit: 2013-09-26 | Discharge: 2013-09-26 | Disposition: A | Discharge status: Home / Self Care | Payer: Medicare HMO | Source: Ambulatory Visit | Attending: Internal Medicine | Admitting: Internal Medicine

## 2013-09-26 DIAGNOSIS — Z1231 Encounter for screening mammogram for malignant neoplasm of breast: Secondary | ICD-10-CM

## 2013-09-28 ENCOUNTER — Other Ambulatory Visit (HOSPITAL_COMMUNITY): Payer: Self-pay | Admitting: Urology

## 2013-09-28 ENCOUNTER — Ambulatory Visit (HOSPITAL_COMMUNITY)
Admission: RE | Admit: 2013-09-28 | Discharge: 2013-09-28 | Disposition: A | Discharge status: Home / Self Care | Payer: Medicare HMO | Source: Ambulatory Visit | Attending: Urology | Admitting: Urology

## 2013-09-28 DIAGNOSIS — C649 Malignant neoplasm of unspecified kidney, except renal pelvis: Secondary | ICD-10-CM | POA: Insufficient documentation

## 2013-09-28 DIAGNOSIS — Z85528 Personal history of other malignant neoplasm of kidney: Secondary | ICD-10-CM

## 2014-03-07 ENCOUNTER — Other Ambulatory Visit (HOSPITAL_COMMUNITY): Payer: Self-pay | Admitting: Internal Medicine

## 2014-03-07 ENCOUNTER — Ambulatory Visit (HOSPITAL_COMMUNITY)
Admission: RE | Admit: 2014-03-07 | Discharge: 2014-03-07 | Disposition: A | Discharge status: Home / Self Care | Payer: Medicare HMO | Source: Ambulatory Visit | Attending: Internal Medicine | Admitting: Internal Medicine

## 2014-03-07 DIAGNOSIS — M949 Disorder of cartilage, unspecified: Secondary | ICD-10-CM

## 2014-03-07 DIAGNOSIS — M1610 Unilateral primary osteoarthritis, unspecified hip: Secondary | ICD-10-CM | POA: Diagnosis not present

## 2014-03-07 DIAGNOSIS — G8929 Other chronic pain: Secondary | ICD-10-CM | POA: Insufficient documentation

## 2014-03-07 DIAGNOSIS — M8560 Other cyst of bone, unspecified site: Secondary | ICD-10-CM | POA: Insufficient documentation

## 2014-03-07 DIAGNOSIS — M169 Osteoarthritis of hip, unspecified: Secondary | ICD-10-CM | POA: Insufficient documentation

## 2014-03-07 DIAGNOSIS — M899 Disorder of bone, unspecified: Secondary | ICD-10-CM | POA: Insufficient documentation

## 2014-03-07 DIAGNOSIS — M25559 Pain in unspecified hip: Secondary | ICD-10-CM | POA: Insufficient documentation

## 2014-03-07 DIAGNOSIS — R52 Pain, unspecified: Secondary | ICD-10-CM

## 2014-04-08 ENCOUNTER — Emergency Department (HOSPITAL_COMMUNITY)
Admission: EM | Admit: 2014-04-08 | Discharge: 2014-04-09 | Disposition: A | Discharge status: Home / Self Care | Payer: Medicare HMO | Attending: Emergency Medicine | Admitting: Emergency Medicine

## 2014-04-08 ENCOUNTER — Emergency Department (HOSPITAL_COMMUNITY): Payer: Medicare HMO

## 2014-04-08 ENCOUNTER — Encounter (HOSPITAL_COMMUNITY): Payer: Self-pay | Admitting: Emergency Medicine

## 2014-04-08 DIAGNOSIS — J989 Respiratory disorder, unspecified: Secondary | ICD-10-CM | POA: Diagnosis not present

## 2014-04-08 DIAGNOSIS — N189 Chronic kidney disease, unspecified: Secondary | ICD-10-CM | POA: Insufficient documentation

## 2014-04-08 DIAGNOSIS — E785 Hyperlipidemia, unspecified: Secondary | ICD-10-CM | POA: Insufficient documentation

## 2014-04-08 DIAGNOSIS — R05 Cough: Secondary | ICD-10-CM | POA: Insufficient documentation

## 2014-04-08 DIAGNOSIS — IMO0002 Reserved for concepts with insufficient information to code with codable children: Secondary | ICD-10-CM | POA: Diagnosis not present

## 2014-04-08 DIAGNOSIS — R110 Nausea: Secondary | ICD-10-CM | POA: Diagnosis not present

## 2014-04-08 DIAGNOSIS — Z79899 Other long term (current) drug therapy: Secondary | ICD-10-CM | POA: Diagnosis not present

## 2014-04-08 DIAGNOSIS — I129 Hypertensive chronic kidney disease with stage 1 through stage 4 chronic kidney disease, or unspecified chronic kidney disease: Secondary | ICD-10-CM | POA: Diagnosis not present

## 2014-04-08 DIAGNOSIS — R059 Cough, unspecified: Secondary | ICD-10-CM | POA: Diagnosis present

## 2014-04-08 DIAGNOSIS — R000 Tachycardia, unspecified: Secondary | ICD-10-CM | POA: Diagnosis not present

## 2014-04-08 DIAGNOSIS — Z792 Long term (current) use of antibiotics: Secondary | ICD-10-CM | POA: Insufficient documentation

## 2014-04-08 HISTORY — DX: Essential (primary) hypertension: I10

## 2014-04-08 HISTORY — DX: Pure hypercholesterolemia, unspecified: E78.00

## 2014-04-08 LAB — COMPREHENSIVE METABOLIC PANEL
ALT: 10 U/L (ref 0–35)
AST: 26 U/L (ref 0–37)
Albumin: 4.1 g/dL (ref 3.5–5.2)
Alkaline Phosphatase: 95 U/L (ref 39–117)
Anion gap: 17 — ABNORMAL HIGH (ref 5–15)
BUN: 11 mg/dL (ref 6–23)
CO2: 22 mEq/L (ref 19–32)
Calcium: 9.4 mg/dL (ref 8.4–10.5)
Chloride: 104 mEq/L (ref 96–112)
Creatinine, Ser: 1.35 mg/dL — ABNORMAL HIGH (ref 0.50–1.10)
GFR calc Af Amer: 46 mL/min — ABNORMAL LOW (ref >90)
GFR calc non Af Amer: 39 mL/min — ABNORMAL LOW (ref >90)
Glucose, Bld: 94 mg/dL (ref 70–99)
Potassium: 4.6 mEq/L (ref 3.7–5.3)
Sodium: 143 mEq/L (ref 137–147)
Total Bilirubin: 1.1 mg/dL (ref 0.3–1.2)
Total Protein: 7.9 g/dL (ref 6.0–8.3)

## 2014-04-08 LAB — I-STAT TROPONIN, ED: Troponin i, poc: 0.01 ng/mL (ref 0.00–0.08)

## 2014-04-08 LAB — CBC WITH DIFFERENTIAL/PLATELET
Basophils Absolute: 0 10*3/uL (ref 0.0–0.1)
Basophils Relative: 0 % (ref 0–1)
Eosinophils Absolute: 0 10*3/uL (ref 0.0–0.7)
Eosinophils Relative: 1 % (ref 0–5)
HCT: 42.4 % (ref 36.0–46.0)
Hemoglobin: 14.5 g/dL (ref 12.0–15.0)
Lymphocytes Relative: 17 % (ref 12–46)
Lymphs Abs: 1 10*3/uL (ref 0.7–4.0)
MCH: 30.4 pg (ref 26.0–34.0)
MCHC: 34.2 g/dL (ref 30.0–36.0)
MCV: 88.9 fL (ref 78.0–100.0)
Monocytes Absolute: 0.3 10*3/uL (ref 0.1–1.0)
Monocytes Relative: 6 % (ref 3–12)
Neutro Abs: 4.5 10*3/uL (ref 1.7–7.7)
Neutrophils Relative %: 76 % (ref 43–77)
Platelets: 150 10*3/uL (ref 150–400)
RBC: 4.77 MIL/uL (ref 3.87–5.11)
RDW: 13.3 % (ref 11.5–15.5)
WBC: 5.8 10*3/uL (ref 4.0–10.5)

## 2014-04-08 LAB — PRO B NATRIURETIC PEPTIDE: Pro B Natriuretic peptide (BNP): 294 pg/mL — ABNORMAL HIGH (ref 0–125)

## 2014-04-08 MED ORDER — ONDANSETRON 4 MG PO TBDP
8.0000 mg | ORAL_TABLET | Freq: Once | ORAL | Status: AC
  Administered 2014-04-08: 8 mg via ORAL
  Filled 2014-04-08: qty 2

## 2014-04-08 MED ORDER — IPRATROPIUM-ALBUTEROL 0.5-2.5 (3) MG/3ML IN SOLN
3.0000 mL | Freq: Once | RESPIRATORY_TRACT | Status: AC
  Administered 2014-04-08: 3 mL via RESPIRATORY_TRACT
  Filled 2014-04-08: qty 3

## 2014-04-08 MED ORDER — BENZONATATE 100 MG PO CAPS
100.0000 mg | ORAL_CAPSULE | Freq: Once | ORAL | Status: AC
  Administered 2014-04-08: 100 mg via ORAL
  Filled 2014-04-08: qty 1

## 2014-04-08 MED ORDER — PREDNISONE 20 MG PO TABS
60.0000 mg | ORAL_TABLET | Freq: Once | ORAL | Status: AC
  Administered 2014-04-08: 60 mg via ORAL
  Filled 2014-04-08: qty 3

## 2014-04-08 NOTE — ED Notes (Signed)
Pt reports cough and chest pain starting this morning. Reports pain in chest is only when coughing. Reports symptoms started this morning. Cough is productive with white phlegm

## 2014-04-08 NOTE — ED Provider Notes (Signed)
CSN: [id number]     Arrival date & time 04/08/14  1901 History   First MD Initiated Contact with Patient 04/08/14 2143     Chief Complaint  Patient presents with  . Cough    (Consider location/radiation/quality/duration/timing/severity/associated sxs/prior Treatment) HPI Comments: Patient is a 68 year old female with a history of chronic kidney disease secondary to kidney resection, hypertension, and hyperlipidemia who presents to the emergency department for upper respiratory symptoms. Patient states that she awoke this morning with a cough productive of clear sputum. She states that when she coughs she feels a discomfort in her chest. She also endorses a sensation of chest tightness/congestion. Symptoms associated with mild nasal congestion as well as fever of 101F prior to arrival. Patient states that she took aspirin for symptoms with relief of her fever. She endorses some associated nausea as well which is associated with prolonged coughing. Patient states "my face feels achy". She denies sick contacts, difficulty swallowing, abdominal pain, vomiting, hemoptysis, and syncope. States she takes Pravachol daily.  Patient is a 68 y.o. female presenting with cough. The history is provided by the patient. No language interpreter was used.  Cough Associated symptoms: chest pain (with coughing only), fever and sore throat     Past Medical History  Diagnosis Date  . 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 Hypertension   . Hypercholesterolemia    Past Surgical History  Procedure Laterality Date  . Nephrectomy Right     due to carcinoma  . Knee arthroscopy Left   . Hysteroscopy w/d&c N/A 04/23/2013    Procedure: DILATATION AND CURETTAGE /HYSTEROSCOPY;  Surgeon: Maeola Sarah. Landry Mellow, MD;  Location: [facility];  Service: Gynecology;  Laterality: N/A;  . Cervical polypectomy N/A 04/23/2013    Procedure: CERVICAL POLYPECTOMY;  Surgeon: Maeola Sarah. Landry Mellow, MD;  Location: [facility];   Service: Gynecology;  Laterality: N/A;  Possible Polypectomy   No family history on file. History  Substance Use Topics  . Smoking status: Never Smoker   . Smokeless tobacco: Not on file  . Alcohol Use: No   OB History   Grav Para Term Preterm Abortions TAB SAB Ect Mult Living                  Review of Systems  Constitutional: Positive for fever.  HENT: Positive for congestion and sore throat. Negative for trouble swallowing.   Respiratory: Positive for cough and chest tightness.   Cardiovascular: Positive for chest pain (with coughing only).  Gastrointestinal: Positive for nausea. Negative for vomiting, abdominal pain and diarrhea.  Neurological: Negative for syncope.  All other systems reviewed and are negative.    Allergies  Review of patient's allergies indicates no known allergies.  Home Medications   Prior to Admission medications   Medication Sig Start Date End Date Taking? Authorizing Provider  azithromycin (ZITHROMAX Z-PAK) 250 MG tablet Take 1 tablet (250 mg total) by mouth daily. 500mg  PO day 1, then 250mg  PO days 205 04/09/14   Antonietta Breach, PA-C  benzonatate (TESSALON) 100 MG capsule Take 1 capsule (100 mg total) by mouth 3 (three) times daily as needed for cough. 04/09/14   Antonietta Breach, PA-C  doxycycline (VIBRAMYCIN) 50 MG capsule Take 2 capsules (100 mg total) by mouth daily. 04/23/13   Maeola Sarah. Landry Mellow, MD  HYDROcodone-acetaminophen [facility]) 5-325 MG per tablet 1-2 po every 6 hours prn 04/23/13   Maeola Sarah. Landry Mellow, MD  ibuprofen (ADVIL,MOTRIN) 600 MG tablet Take  1 tablet (600 mg total) by mouth every 6 (six) hours as needed for pain. 04/23/13   Maeola Sarah. Landry Mellow, MD  medroxyPROGESTERone (PROVERA) 10 MG tablet Take 10 mg by mouth daily.    Historical Provider, MD  pravastatin (PRAVACHOL) 80 MG tablet Take 80 mg by mouth daily.    Historical Provider, MD  predniSONE (DELTASONE) 20 MG tablet Take 2 tablets (40 mg total) by mouth daily. 04/09/14   Antonietta Breach, PA-C   BP 137/76  Pulse  99  Temp(Src) 99.1 F (37.3 C) (Oral)  Resp 16  Ht 5\' 2"  (1.575 m)  Wt 192 lb (87.091 kg)  BMI 35.11 kg/m2  SpO2 100%  LMP 02/12/2013  Physical Exam  Nursing note and vitals reviewed. Constitutional: She is oriented to person, place, and time. She appears well-developed and well-nourished. No distress.  Nontoxic/nonseptic appearing  HENT:  Head: Normocephalic and atraumatic.  Mouth/Throat: Oropharynx is clear and moist. No oropharyngeal exudate.  Patient tolerating secretions without difficulty.  Eyes: Conjunctivae and EOM are normal. No scleral icterus.  Neck: Normal range of motion.  Cardiovascular: Regular rhythm and normal heart sounds.  Tachycardia present.   Pulmonary/Chest: Effort normal. No respiratory distress. She has no rales.  +mild rhonchi and expiratory wheeze heard in RLL. No tachypnea or dyspnea.  Abdominal: Soft. She exhibits no distension. There is no tenderness. There is no rebound and no guarding.  Soft, nontender  Musculoskeletal: Normal range of motion.  Neurological: She is alert and oriented to person, place, and time. She exhibits normal muscle tone. Coordination normal.  Skin: Skin is warm and dry. No rash noted. She is not diaphoretic. No erythema. No pallor.  Psychiatric: She has a normal mood and affect. Her behavior is normal.    ED Course  Procedures (including critical care time) Labs Review Labs Reviewed  COMPREHENSIVE METABOLIC PANEL - Abnormal; Notable for the following:    Creatinine, Ser 1.35 (*)    GFR calc non Af Amer 39 (*)    GFR calc Af Amer 46 (*)    Anion gap 17 (*)    All other components within normal limits  PRO B NATRIURETIC PEPTIDE - Abnormal; Notable for the following:    Pro B Natriuretic peptide (BNP) 294.0 (*)    All other components within normal limits  CBC WITH DIFFERENTIAL  Randolm Idol, ED    Imaging Review Dg Chest 2 View  04/08/2014   CLINICAL DATA:  Cough, chest pain, fever  EXAM: CHEST  2 VIEW   COMPARISON:  09/28/2013  FINDINGS: Trace pleural effusions are noted. Borderline cardiomegaly is present without evidence for edema. No focal consolidation. Degenerative changes are noted in the spine. No new osseous abnormality.  IMPRESSION: New trace pleural effusions and mild cardiomegaly. No focal pulmonary opacity.   Electronically Signed   By: Conchita Paris M.D.   On: 04/08/2014 19:59     EKG Interpretation   Date/Time:  Monday April 08 2014 22:06:11 EDT Ventricular Rate:  98 PR Interval:  124 QRS Duration: 78 QT Interval:  326 QTC Calculation: 416 R Axis:   63 Text Interpretation:  Normal sinus rhythm Nonspecific T wave abnormality  Abnormal ECG No significant change since last tracing Confirmed by POLLINA   MD, CHRISTOPHER [phone]) on 04/08/2014 11:16:50 PM      MDM   Final diagnoses:  Respiratory illness    68 year old female presents to the emergency department for further evaluation of upper respiratory symptoms and chest congestion. Patient states that she  awoke this morning with these symptoms. She also endorses a fever at home of 101F which responded to aspirin. Patient denies any sick contacts. Chest x-ray obtained today which showed no trace pleural effusions as well as mild cardiomegaly. No evidence of focal consolidation or pneumonia. Given these findings, laboratory work up initiated. Patient has a normal troponin today as well as stable kidney function and, only mildly elevated, BNP. No leukocytosis today.  Patient treated in the emergency department with a breathing treatment, prednisone, and Tessalon. Patient endorses some improvement with this regimen. Lung sounds improved on reexamination. Patient is nontoxic and nonseptic appearing. She ambulates in the emergency department without hypoxia. No fever or hypoxia while at rest over ED course.   DDx early influenza vs other viral respiratory process vs early bacterial process. Believe patient is stable for d/c  today with Azithromycin, Prednisone x 5 days, tessalon, and albuterol inhaler for symptomatic management. Have advised PCP f/u by week's end. Return precautions discussed and provided. Patient agreeable to plan with no unaddressed concerns. Case discussed with Dr. Betsey Holiday who is in agreement with assessment, management plan, and patient's stability for discharge.   Filed Vitals:   04/08/14 1917 04/08/14 2251  BP: 156/83 137/76  Pulse: 107 99  Temp: 99.2 F (37.3 C) 99.1 F (37.3 C)  TempSrc: Oral Oral  Resp: 14 16  Height: 5\' 2"  (1.575 m)   Weight: 192 lb (87.091 kg)   SpO2: 97% 100%      Antonietta Breach, PA-C 04/09/14 [phone]

## 2014-04-08 NOTE — ED Notes (Signed)
Pt. reports productive cough with chest congestion , low grade fever and nasal congestion onset this morning .

## 2014-04-09 MED ORDER — BENZONATATE 100 MG PO CAPS: 100.0000 mg | ORAL_CAPSULE | Freq: Three times a day (TID) | ORAL | Status: DC | PRN

## 2014-04-09 MED ORDER — AZITHROMYCIN 250 MG PO TABS: 250.0000 mg | ORAL_TABLET | Freq: Every day | ORAL | Status: DC

## 2014-04-09 MED ORDER — ALBUTEROL SULFATE HFA 108 (90 BASE) MCG/ACT IN AERS
2.0000 | INHALATION_SPRAY | Freq: Once | RESPIRATORY_TRACT | Status: AC
  Administered 2014-04-09: 2 via RESPIRATORY_TRACT
  Filled 2014-04-09: qty 6.7

## 2014-04-09 MED ORDER — PREDNISONE 20 MG PO TABS: 40.0000 mg | ORAL_TABLET | Freq: Every day | ORAL | Status: DC

## 2014-04-09 NOTE — Discharge Instructions (Signed)
Take azithromycin as prescribed. Take Tessalon as needed for cough. Also recommend that you take prednisone as prescribed. Use an albuterol inhaler, 2 puffs every 4 hours, as needed for shortness of breath and wheezing. Followup with your primary care provider by weeks and for a recheck. Return if symptoms worsen.  Upper Respiratory Infection, Adult An upper respiratory infection (URI) is also known as the common cold. It is often caused by a type of germ (virus). Colds are easily spread (contagious). You can pass it to others by kissing, coughing, sneezing, or drinking out of the same glass. Usually, you get better in 1 or 2 weeks.  HOME CARE   Only take medicine as told by your doctor.  Use a warm mist humidifier or breathe in steam from a hot shower.  Drink enough water and fluids to keep your pee (urine) clear or pale yellow.  Get plenty of rest.  Return to work when your temperature is back to normal or as told by your doctor. You may use a face mask and wash your hands to stop your cold from spreading. GET HELP RIGHT AWAY IF:   After the first few days, you feel you are getting worse.  You have questions about your medicine.  You have chills, shortness of breath, or brown or red spit (mucus).  You have yellow or brown snot (nasal discharge) or pain in the face, especially when you bend forward.  You have a fever, puffy (swollen) neck, pain when you swallow, or white spots in the back of your throat.  You have a bad headache, ear pain, sinus pain, or chest pain.  You have a high-pitched whistling sound when you breathe in and out (wheezing).  You have a lasting cough or cough up blood.  You have sore muscles or a stiff neck. MAKE SURE YOU:   Understand these instructions.  Will watch your condition.  Will get help right away if you are not doing well or get worse. Document Released: 01/05/2008 Document Revised: 10/11/2011 Document Reviewed: 10/24/2013 [facility] Patient  Information 2015 [place], [place]. This information is not intended to replace advice given to you by your health care provider. Make sure you discuss any questions you have with your health care provider.

## 2014-04-09 NOTE — ED Provider Notes (Signed)
Medical screening examination/treatment/procedure(s) were performed by non-physician practitioner and as supervising physician I was immediately available for consultation/collaboration.   EKG Interpretation   Date/Time:  Monday April 08 2014 22:06:11 EDT Ventricular Rate:  98 PR Interval:  124 QRS Duration: 78 QT Interval:  326 QTC Calculation: 416 R Axis:   63 Text Interpretation:  Normal sinus rhythm Nonspecific T wave abnormality  Abnormal ECG No significant change since last tracing Confirmed by Betsey Holiday   MD, Margie Urbanowicz [phone]) on 04/08/2014 11:16:50 PM        Orpah Greek, MD 04/09/14 [phone]

## 2014-04-09 NOTE — ED Notes (Signed)
Declined W/C at D/C and was escorted to lobby by RN. 

## 2014-09-05 ENCOUNTER — Other Ambulatory Visit (HOSPITAL_COMMUNITY): Payer: Self-pay | Admitting: Internal Medicine

## 2014-09-05 DIAGNOSIS — Z139 Encounter for screening, unspecified: Secondary | ICD-10-CM

## 2014-09-30 ENCOUNTER — Ambulatory Visit (HOSPITAL_COMMUNITY)
Admission: RE | Admit: 2014-09-30 | Discharge: 2014-09-30 | Disposition: A | Discharge status: Home / Self Care | Payer: PPO | Source: Ambulatory Visit | Attending: Internal Medicine | Admitting: Internal Medicine

## 2014-09-30 ENCOUNTER — Other Ambulatory Visit (HOSPITAL_COMMUNITY): Payer: Self-pay | Admitting: Internal Medicine

## 2014-09-30 DIAGNOSIS — Z1231 Encounter for screening mammogram for malignant neoplasm of breast: Secondary | ICD-10-CM | POA: Diagnosis not present

## 2014-09-30 DIAGNOSIS — Z139 Encounter for screening, unspecified: Secondary | ICD-10-CM

## 2014-10-08 ENCOUNTER — Other Ambulatory Visit (HOSPITAL_COMMUNITY): Payer: Self-pay | Admitting: Urology

## 2014-10-08 ENCOUNTER — Ambulatory Visit (HOSPITAL_COMMUNITY)
Admission: RE | Admit: 2014-10-08 | Discharge: 2014-10-08 | Disposition: A | Discharge status: Home / Self Care | Payer: PPO | Source: Ambulatory Visit | Attending: Urology | Admitting: Urology

## 2014-10-08 DIAGNOSIS — Z85528 Personal history of other malignant neoplasm of kidney: Secondary | ICD-10-CM

## 2014-11-13 ENCOUNTER — Other Ambulatory Visit: Payer: Self-pay | Admitting: Orthopaedic Surgery

## 2014-11-13 DIAGNOSIS — M25512 Pain in left shoulder: Secondary | ICD-10-CM

## 2014-11-26 ENCOUNTER — Ambulatory Visit
Admission: RE | Admit: 2014-11-26 | Discharge: 2014-11-26 | Disposition: A | Discharge status: Home / Self Care | Payer: PPO | Source: Ambulatory Visit | Attending: Orthopaedic Surgery | Admitting: Orthopaedic Surgery

## 2014-11-26 DIAGNOSIS — M25512 Pain in left shoulder: Secondary | ICD-10-CM

## 2014-11-26 MED ORDER — GADOBENATE DIMEGLUMINE 529 MG/ML IV SOLN
15.0000 mL | Freq: Once | INTRAVENOUS | Status: AC | PRN
  Administered 2014-11-26: 15 mL via INTRAVENOUS

## 2014-12-12 ENCOUNTER — Other Ambulatory Visit: Payer: Self-pay | Admitting: Orthopaedic Surgery

## 2015-07-08 ENCOUNTER — Other Ambulatory Visit: Payer: Self-pay | Admitting: Obstetrics and Gynecology

## 2015-07-08 ENCOUNTER — Other Ambulatory Visit (HOSPITAL_COMMUNITY)
Admission: RE | Admit: 2015-07-08 | Discharge: 2015-07-08 | Disposition: A | Discharge status: Home / Self Care | Payer: PPO | Source: Ambulatory Visit | Attending: Obstetrics and Gynecology | Admitting: Obstetrics and Gynecology

## 2015-07-08 DIAGNOSIS — Z1151 Encounter for screening for human papillomavirus (HPV): Secondary | ICD-10-CM | POA: Diagnosis present

## 2015-07-08 DIAGNOSIS — N6320 Unspecified lump in the left breast, unspecified quadrant: Secondary | ICD-10-CM

## 2015-07-08 DIAGNOSIS — Z01419 Encounter for gynecological examination (general) (routine) without abnormal findings: Secondary | ICD-10-CM | POA: Insufficient documentation

## 2015-07-10 LAB — CYTOLOGY - PAP

## 2015-07-15 ENCOUNTER — Other Ambulatory Visit: Payer: PPO

## 2015-07-16 ENCOUNTER — Ambulatory Visit
Admission: RE | Admit: 2015-07-16 | Discharge: 2015-07-16 | Disposition: A | Discharge status: Home / Self Care | Payer: PPO | Source: Ambulatory Visit | Attending: Obstetrics and Gynecology | Admitting: Obstetrics and Gynecology

## 2015-07-16 ENCOUNTER — Other Ambulatory Visit: Payer: Self-pay | Admitting: Obstetrics and Gynecology

## 2015-07-16 DIAGNOSIS — N6320 Unspecified lump in the left breast, unspecified quadrant: Secondary | ICD-10-CM

## 2015-09-15 ENCOUNTER — Other Ambulatory Visit: Payer: Self-pay

## 2015-09-15 DIAGNOSIS — Z1231 Encounter for screening mammogram for malignant neoplasm of breast: Secondary | ICD-10-CM

## 2015-10-02 ENCOUNTER — Ambulatory Visit
Admission: RE | Admit: 2015-10-02 | Discharge: 2015-10-02 | Disposition: A | Discharge status: Home / Self Care | Payer: PPO | Source: Ambulatory Visit

## 2015-10-02 DIAGNOSIS — Z1231 Encounter for screening mammogram for malignant neoplasm of breast: Secondary | ICD-10-CM

## 2015-10-16 DIAGNOSIS — J069 Acute upper respiratory infection, unspecified: Secondary | ICD-10-CM | POA: Diagnosis not present

## 2015-10-16 DIAGNOSIS — H1130 Conjunctival hemorrhage, unspecified eye: Secondary | ICD-10-CM | POA: Diagnosis not present

## 2015-10-17 ENCOUNTER — Other Ambulatory Visit (HOSPITAL_COMMUNITY): Payer: Self-pay | Admitting: Urology

## 2015-10-17 ENCOUNTER — Ambulatory Visit (HOSPITAL_COMMUNITY)
Admission: RE | Admit: 2015-10-17 | Discharge: 2015-10-17 | Disposition: A | Discharge status: Home / Self Care | Payer: PPO | Source: Ambulatory Visit | Attending: Urology | Admitting: Urology

## 2015-10-17 DIAGNOSIS — Z85528 Personal history of other malignant neoplasm of kidney: Secondary | ICD-10-CM

## 2015-10-17 DIAGNOSIS — R935 Abnormal findings on diagnostic imaging of other abdominal regions, including retroperitoneum: Secondary | ICD-10-CM | POA: Diagnosis not present

## 2015-10-17 DIAGNOSIS — C649 Malignant neoplasm of unspecified kidney, except renal pelvis: Secondary | ICD-10-CM | POA: Diagnosis not present

## 2015-10-21 DIAGNOSIS — Z0000 Encounter for general adult medical examination without abnormal findings: Secondary | ICD-10-CM | POA: Diagnosis not present

## 2015-10-21 DIAGNOSIS — Z85528 Personal history of other malignant neoplasm of kidney: Secondary | ICD-10-CM | POA: Diagnosis not present

## 2015-10-22 DIAGNOSIS — H25013 Cortical age-related cataract, bilateral: Secondary | ICD-10-CM | POA: Diagnosis not present

## 2015-10-22 DIAGNOSIS — H1132 Conjunctival hemorrhage, left eye: Secondary | ICD-10-CM | POA: Diagnosis not present

## 2015-10-22 DIAGNOSIS — H2513 Age-related nuclear cataract, bilateral: Secondary | ICD-10-CM | POA: Diagnosis not present

## 2015-10-22 DIAGNOSIS — H40013 Open angle with borderline findings, low risk, bilateral: Secondary | ICD-10-CM | POA: Diagnosis not present

## 2015-12-19 DIAGNOSIS — M2550 Pain in unspecified joint: Secondary | ICD-10-CM | POA: Diagnosis not present

## 2016-01-07 ENCOUNTER — Observation Stay (HOSPITAL_COMMUNITY)
Admission: EM | Admit: 2016-01-07 | Discharge: 2016-01-13 | Disposition: A | Discharge status: Home / Self Care | Payer: PPO | Attending: Cardiovascular Disease | Admitting: Cardiovascular Disease

## 2016-01-07 ENCOUNTER — Emergency Department (HOSPITAL_COMMUNITY): Payer: PPO

## 2016-01-07 ENCOUNTER — Encounter (HOSPITAL_COMMUNITY): Payer: Self-pay | Admitting: Vascular Surgery

## 2016-01-07 DIAGNOSIS — N183 Chronic kidney disease, stage 3 unspecified: Secondary | ICD-10-CM

## 2016-01-07 DIAGNOSIS — E785 Hyperlipidemia, unspecified: Secondary | ICD-10-CM

## 2016-01-07 DIAGNOSIS — I10 Essential (primary) hypertension: Secondary | ICD-10-CM | POA: Diagnosis not present

## 2016-01-07 DIAGNOSIS — I509 Heart failure, unspecified: Secondary | ICD-10-CM | POA: Diagnosis not present

## 2016-01-07 DIAGNOSIS — Z905 Acquired absence of kidney: Secondary | ICD-10-CM | POA: Insufficient documentation

## 2016-01-07 DIAGNOSIS — R7401 Elevation of levels of liver transaminase levels: Secondary | ICD-10-CM

## 2016-01-07 DIAGNOSIS — E7800 Pure hypercholesterolemia, unspecified: Secondary | ICD-10-CM | POA: Diagnosis not present

## 2016-01-07 DIAGNOSIS — Z7982 Long term (current) use of aspirin: Secondary | ICD-10-CM | POA: Insufficient documentation

## 2016-01-07 DIAGNOSIS — R0789 Other chest pain: Secondary | ICD-10-CM | POA: Diagnosis not present

## 2016-01-07 DIAGNOSIS — R079 Chest pain, unspecified: Secondary | ICD-10-CM | POA: Diagnosis not present

## 2016-01-07 DIAGNOSIS — I340 Nonrheumatic mitral (valve) insufficiency: Secondary | ICD-10-CM | POA: Insufficient documentation

## 2016-01-07 DIAGNOSIS — I130 Hypertensive heart and chronic kidney disease with heart failure and stage 1 through stage 4 chronic kidney disease, or unspecified chronic kidney disease: Secondary | ICD-10-CM | POA: Diagnosis not present

## 2016-01-07 DIAGNOSIS — I5041 Acute combined systolic (congestive) and diastolic (congestive) heart failure: Secondary | ICD-10-CM | POA: Insufficient documentation

## 2016-01-07 DIAGNOSIS — I272 Other secondary pulmonary hypertension: Secondary | ICD-10-CM | POA: Diagnosis not present

## 2016-01-07 DIAGNOSIS — R0602 Shortness of breath: Secondary | ICD-10-CM | POA: Diagnosis not present

## 2016-01-07 DIAGNOSIS — R7989 Other specified abnormal findings of blood chemistry: Secondary | ICD-10-CM

## 2016-01-07 DIAGNOSIS — I071 Rheumatic tricuspid insufficiency: Secondary | ICD-10-CM | POA: Diagnosis not present

## 2016-01-07 DIAGNOSIS — Z85528 Personal history of other malignant neoplasm of kidney: Secondary | ICD-10-CM | POA: Insufficient documentation

## 2016-01-07 DIAGNOSIS — R51 Headache: Secondary | ICD-10-CM | POA: Diagnosis not present

## 2016-01-07 DIAGNOSIS — I5031 Acute diastolic (congestive) heart failure: Secondary | ICD-10-CM | POA: Diagnosis not present

## 2016-01-07 DIAGNOSIS — J90 Pleural effusion, not elsewhere classified: Secondary | ICD-10-CM | POA: Diagnosis not present

## 2016-01-07 DIAGNOSIS — R0600 Dyspnea, unspecified: Secondary | ICD-10-CM | POA: Diagnosis not present

## 2016-01-07 HISTORY — DX: Chronic kidney disease, stage 3 (moderate): N18.3

## 2016-01-07 HISTORY — DX: Personal history of other malignant neoplasm of kidney: Z85.528

## 2016-01-07 HISTORY — DX: Chronic kidney disease, stage 3 unspecified: N18.30

## 2016-01-07 LAB — CBC WITH DIFFERENTIAL/PLATELET
Basophils Absolute: 0 10*3/uL (ref 0.0–0.1)
Basophils Relative: 0 %
Eosinophils Absolute: 0 10*3/uL (ref 0.0–0.7)
Eosinophils Relative: 0 %
HCT: 40.6 % (ref 36.0–46.0)
Hemoglobin: 13.2 g/dL (ref 12.0–15.0)
Lymphocytes Relative: 32 %
Lymphs Abs: 1.4 10*3/uL (ref 0.7–4.0)
MCH: 28.9 pg (ref 26.0–34.0)
MCHC: 32.5 g/dL (ref 30.0–36.0)
MCV: 89 fL (ref 78.0–100.0)
Monocytes Absolute: 0.3 10*3/uL (ref 0.1–1.0)
Monocytes Relative: 7 %
Neutro Abs: 2.7 10*3/uL (ref 1.7–7.7)
Neutrophils Relative %: 61 %
Platelets: 297 10*3/uL (ref 150–400)
RBC: 4.56 MIL/uL (ref 3.87–5.11)
RDW: 13.5 % (ref 11.5–15.5)
WBC: 4.4 10*3/uL (ref 4.0–10.5)

## 2016-01-07 LAB — HEPATIC FUNCTION PANEL
ALT: 22 U/L (ref 14–54)
AST: 20 U/L (ref 15–41)
Albumin: 3.5 g/dL (ref 3.5–5.0)
Alkaline Phosphatase: 81 U/L (ref 38–126)
Bilirubin, Direct: 0.3 mg/dL (ref 0.1–0.5)
Indirect Bilirubin: 1.2 mg/dL — ABNORMAL HIGH (ref 0.3–0.9)
Total Bilirubin: 1.5 mg/dL — ABNORMAL HIGH (ref 0.3–1.2)
Total Protein: 6.8 g/dL (ref 6.5–8.1)

## 2016-01-07 LAB — BASIC METABOLIC PANEL
Anion gap: 6 (ref 5–15)
BUN: 14 mg/dL (ref 6–20)
CO2: 25 mmol/L (ref 22–32)
Calcium: 9.6 mg/dL (ref 8.9–10.3)
Chloride: 112 mmol/L — ABNORMAL HIGH (ref 101–111)
Creatinine, Ser: 1.37 mg/dL — ABNORMAL HIGH (ref 0.44–1.00)
GFR calc Af Amer: 44 mL/min — ABNORMAL LOW (ref >60)
GFR calc non Af Amer: 38 mL/min — ABNORMAL LOW (ref >60)
Glucose, Bld: 100 mg/dL — ABNORMAL HIGH (ref 65–99)
Potassium: 4.2 mmol/L (ref 3.5–5.1)
Sodium: 143 mmol/L (ref 135–145)

## 2016-01-07 LAB — I-STAT TROPONIN, ED: Troponin i, poc: 0.02 ng/mL (ref 0.00–0.08)

## 2016-01-07 LAB — PROTIME-INR
INR: 1.06 (ref 0.00–1.49)
Prothrombin Time: 14 seconds (ref 11.6–15.2)

## 2016-01-07 LAB — TROPONIN I
Troponin I: 0.05 ng/mL — ABNORMAL HIGH (ref <0.031)
Troponin I: 0.06 ng/mL — ABNORMAL HIGH (ref <0.031)
Troponin I: 0.06 ng/mL — ABNORMAL HIGH (ref <0.031)

## 2016-01-07 LAB — BRAIN NATRIURETIC PEPTIDE: B Natriuretic Peptide: 425.2 pg/mL — ABNORMAL HIGH (ref 0.0–100.0)

## 2016-01-07 LAB — D-DIMER, QUANTITATIVE: D-Dimer, Quant: 1.96 ug/mL-FEU — ABNORMAL HIGH (ref 0.00–0.50)

## 2016-01-07 MED ORDER — FUROSEMIDE 10 MG/ML IJ SOLN
40.0000 mg | Freq: Once | INTRAMUSCULAR | Status: AC
  Administered 2016-01-07: 40 mg via INTRAVENOUS
  Filled 2016-01-07: qty 4

## 2016-01-07 MED ORDER — SODIUM CHLORIDE 0.9% FLUSH
3.0000 mL | Freq: Two times a day (BID) | INTRAVENOUS | Status: DC
  Administered 2016-01-08 – 2016-01-11 (×9): 3 mL via INTRAVENOUS

## 2016-01-07 MED ORDER — ACETAMINOPHEN 325 MG PO TABS
650.0000 mg | ORAL_TABLET | ORAL | Status: DC | PRN
Start: 2016-01-07 — End: 2016-01-12
  Administered 2016-01-08 – 2016-01-11 (×7): 650 mg via ORAL
  Filled 2016-01-07 (×7): qty 2

## 2016-01-07 MED ORDER — SODIUM CHLORIDE 0.9 % IV SOLN: 250.0000 mL | INTRAVENOUS | Status: DC | PRN

## 2016-01-07 MED ORDER — ASPIRIN EC 81 MG PO TBEC
81.0000 mg | DELAYED_RELEASE_TABLET | Freq: Every day | ORAL | Status: DC
  Administered 2016-01-08 – 2016-01-11 (×4): 81 mg via ORAL
  Filled 2016-01-07 (×5): qty 1

## 2016-01-07 MED ORDER — MORPHINE SULFATE (PF) 2 MG/ML IV SOLN
2.0000 mg | Freq: Once | INTRAVENOUS | Status: AC
  Administered 2016-01-07: 2 mg via INTRAVENOUS
  Filled 2016-01-07: qty 1

## 2016-01-07 MED ORDER — ACETAMINOPHEN 500 MG PO TABS
500.0000 mg | ORAL_TABLET | Freq: Once | ORAL | Status: AC
  Administered 2016-01-07: 500 mg via ORAL
  Filled 2016-01-07: qty 1

## 2016-01-07 MED ORDER — TECHNETIUM TC 99M DIETHYLENETRIAME-PENTAACETIC ACID: 31.6000 | Freq: Once | INTRAVENOUS | Status: DC | PRN

## 2016-01-07 MED ORDER — ONDANSETRON HCL 4 MG/2ML IJ SOLN: 4.0000 mg | Freq: Four times a day (QID) | INTRAMUSCULAR | Status: DC | PRN

## 2016-01-07 MED ORDER — TECHNETIUM TO 99M ALBUMIN AGGREGATED
4.1000 | Freq: Once | INTRAVENOUS | Status: AC | PRN
  Administered 2016-01-07: 4 via INTRAVENOUS

## 2016-01-07 MED ORDER — SODIUM CHLORIDE 0.9% FLUSH: 3.0000 mL | INTRAVENOUS | Status: DC | PRN

## 2016-01-07 NOTE — H&P (Signed)
Cardiology History & Physical    Patient ID: Katie [name] MRN: [id number], DOB: 03-04-1946 Date of Encounter: 01/07/2016, 4:53 PM Primary Physician: Kandice Hams, MD Primary Cardiologist: New  Chief Complaint: SOB Reason for Admission: suspected CHF, elevated troponin Requesting MD: Dr. Stark Jock  HPI: Katie [name] is a 70 y/o F with history of kidney cancer (the only history the patient provides), and CKD stage III, HTN, and HLD per chart. For the past 3-4 days she has noticed progressive SOB particularly with exertion. It improves when she stops and rests. She also has noticed left upper chest pain that lasts one to two seconds at a time, on and off for several days, without any associated provocative measure except sometimes coming on with palpation. It is not worse with exertion. She also reports orthopnea and dry cough for several days. She noticed wheezing last night. No nausea, vomiting, change in appetite ("I like to eat"), syncope, or bleeding. She reports chronic RLE pedal edema that waxes and wanes, not present today. She hasn't noticed any change in weight. Due to persistent DOE she presented to her PCP's office today. Given sx she was sent to the ER. Received 1 SL NTG and 324mg  ASA PTA which reportedly took pain from 8/10 to 6/10. VSS in the ER, BP mildly elevated at times up to 146/93. She was given 500mg  Tylenol, Lasix 40mg  IV daily, 2mg  IV morphine. Labs show troponin 0.05-0.06, BNP 452, Cr 1.37 c/w prior. D-dimer 1.96 - subsequent VQ with matching defect RML; low probability of PE. CXR chronic cardiomegaly with increased interstitial opacity and new effusions, favor edema with consideration of viral/atypical infection. WBC normal, afebrile - patient has taken temp at home and it's been normal. She currently denies any CP and is starting to urinate.  Past Medical History  Diagnosis Date  . 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 Hypertension    . Hypercholesterolemia   . History of kidney cancer     a. s/p right nephrectomy ~2007.     Surgical History:  Past Surgical History  Procedure Laterality Date  . Nephrectomy Right     due to carcinoma  . Knee arthroscopy Left   . Hysteroscopy w/d&c N/A 04/23/2013    Procedure: DILATATION AND CURETTAGE /HYSTEROSCOPY;  Surgeon: Maeola Sarah. Landry Mellow, MD;  Location: [facility];  Service: Gynecology;  Laterality: N/A;  . Cervical polypectomy N/A 04/23/2013    Procedure: CERVICAL POLYPECTOMY;  Surgeon: Maeola Sarah. Landry Mellow, MD;  Location: [facility];  Service: Gynecology;  Laterality: N/A;  Possible Polypectomy     Home Meds: Prior to Admission medications   Medication Sig Start Date End Date Taking? Authorizing Provider  aspirin EC 81 MG tablet Take 81 mg by mouth daily.   Yes Historical Provider, MD  azithromycin (ZITHROMAX Z-PAK) 250 MG tablet Take 1 tablet (250 mg total) by mouth daily. 500mg  PO day 1, then 250mg  PO days 205 04/09/14   Antonietta Breach, PA-C  benzonatate (TESSALON) 100 MG capsule Take 1 capsule (100 mg total) by mouth 3 (three) times daily as needed for cough. 04/09/14   Antonietta Breach, PA-C  doxycycline (VIBRAMYCIN) 50 MG capsule Take 2 capsules (100 mg total) by mouth daily. 04/23/13   Christophe Louis, MD  HYDROcodone-acetaminophen [facility]) 5-325 MG per tablet 1-2 po every 6 hours prn 04/23/13   Christophe Louis, MD  ibuprofen (ADVIL,MOTRIN) 600 MG tablet Take 1 tablet (600 mg total) by mouth  every 6 (six) hours as needed for pain. 04/23/13   Christophe Louis, MD  pravastatin (PRAVACHOL) 80 MG tablet Take 80 mg by mouth daily.    Historical Provider, MD  predniSONE (DELTASONE) 20 MG tablet Take 2 tablets (40 mg total) by mouth daily. 04/09/14   Antonietta Breach, PA-C    Allergies:  Allergies  Allergen Reactions  . Tramadol Shortness Of Breath, Palpitations and Other (See Comments)    Patient thinks that her recent symptoms of breathlessness may be related to taking this (??)    Social History   Social History  . Marital  Status: Divorced    Spouse Name: N/A  . Number of Children: N/A  . Years of Education: N/A   Occupational History  . Not on file.   Social History Main Topics  . Smoking status: Never Smoker   . Smokeless tobacco: Not on file  . Alcohol Use: No  . Drug Use: No  . Sexual Activity: Not on file   Other Topics Concern  . Not on file   Social History Narrative     Family History  Problem Relation Age of Onset  . Stroke Brother   . Diabetes Mellitus II Sister     Review of Systems: General: negative for chills, fever, weight changes.  Cardiovascular: no palpitations Dermatological: negative for rash Respiratory: + wheezing Urologic: negative for hematuria Abdominal: negative for nausea, vomiting, diarrhea, bright red blood per rectum, melena, or hematemesis Neurologic: negative for visual changes, syncope, or dizziness All other systems reviewed and are otherwise negative except as noted above.  Labs:   Lab Results  Component Value Date   WBC 4.4 01/07/2016   HGB 13.2 01/07/2016   HCT 40.6 01/07/2016   MCV 89.0 01/07/2016   PLT 297 01/07/2016    Recent Labs Lab 01/07/16 1046  NA 143  K 4.2  CL 112*  CO2 25  BUN 14  CREATININE 1.37*  CALCIUM 9.6  GLUCOSE 100*    Recent Labs  01/07/16 1046 01/07/16 1553  TROPONINI 0.05* 0.06*   No results found for: CHOL, HDL, LDLCALC, TRIG Lab Results  Component Value Date   DDIMER 1.96* 01/07/2016    Radiology/Studies:  Dg Chest 2 View  01/07/2016  CLINICAL DATA:  70 year old female with shortness of breath chest pain and wheezing for 3 days. Initial encounter. EXAM: CHEST  2 VIEW COMPARISON:  10/17/2015 and earlier. FINDINGS: Stable cardiomegaly and mediastinal contours. Small bilateral pleural effusions are new. Mildly increased interstitial markings in both lungs. No pneumothorax or other acute pulmonary opacity. No acute osseous abnormality identified. Stable cholecystectomy clips. IMPRESSION: Chronic  cardiomegaly with increased interstitial opacity and new small pleural effusions. Favor acute interstitial edema with main differential consideration of viral/ atypical respiratory infection. Electronically Signed   By: Genevie Ann M.D.   On: 01/07/2016 10:35   Nm Pulmonary Perf And Vent  01/07/2016  CLINICAL DATA:  Shortness of breath and chest pain for 2 days, history hypertension, chronic kidney disease EXAM: NUCLEAR MEDICINE VENTILATION - PERFUSION LUNG SCAN TECHNIQUE: Ventilation images were obtained in multiple projections using inhaled aerosol Tc-29m DTPA. Perfusion images were obtained in multiple projections after intravenous injection of Tc-41m MAA. RADIOPHARMACEUTICALS:  31.6 mCi Technetium-63m DTPA aerosol inhalation and 4.1 mCi Technetium-59m MAA IV COMPARISON:  None Correlation:  Chest radiograph 01/07/2016 FINDINGS: Ventilation: Aerosol deposition within trachea. Swallowed aerosol within stomach. Subsegmental ventilation defect laterally RIGHT mid lung likely RIGHT middle lobe. Enlargement of cardiac silhouette. No additional segmental or subsegmental  perfusion defects. Perfusion: Matching subsegmental diminished perfusion lateral RIGHT middle lobe. No additional significant perfusion abnormalities. Chest radiograph: Enlargement of cardiac silhouette with pulmonary vascular congestion and question mild pulmonary edema. IMPRESSION: Matching subsegmental diminished ventilation and perfusion in lateral RIGHT middle lobe. Low probability of pulmonary embolism. Electronically Signed   By: Lavonia Dana M.D.   On: 01/07/2016 13:54   Wt Readings from Last 3 Encounters:  04/08/14 192 lb (87.091 kg)  04/17/13 185 lb (83.915 kg)    EKG: NSR 97bpm, nonsepcific ST-T changse - sight sagging inferiorly and V5-V6  Physical Exam: Blood pressure 127/100, pulse 86, temperature 98.1 F (36.7 C), temperature source Oral, resp. rate 23, last menstrual period 02/12/2013, SpO2 99 %. There is no weight on file to  calculate BMI. General: Well developed, well nourished, in no acute distress. Head: Normocephalic, atraumatic, sclera icteric, no xanthomas, nares are without discharge.  Neck: Negative for carotid bruits. JVD not elevated. Lungs: Mildly decreased BS at bases otherwise clear bilaterally to auscultation without wheezes, rales, or rhonchi. Breathing is unlabored. Heart: RRR with S1 S2. No murmurs, rubs, or gallops appreciated. Abdomen: Soft, non-tender, non-distended with normoactive bowel sounds. No hepatomegaly. No rebound/guarding. No obvious abdominal masses. Msk:  Strength and tone appear normal for age. Extremities: No clubbing or cyanosis. No significant edema.  Distal pedal pulses are 2+ and equal bilaterally. Neuro: Alert and oriented X 3. No focal deficit. No facial asymmetry. Moves all extremities spontaneously. Psych:  Responds to questions appropriately with a normal affect.    Assessment and Plan   1. Suspected acute CHF, LVEF unknown - admit, follow output on IV lasix. Check thyroid function, liver function, echo, and cycle troponins.  2. Elevated troponin - continue daily ASA. Cycle troponins. Check lipids. Will discuss further w/u with MD including possible need for heparin per pharmacy. Chest pain is atypical but DOE could represent anginal equivalent. If LV function is down or troponin rises significantly will need to consider cath, keeping in mind CKD with solitary kidney.  2. Mild hypertension - follow with diuresis.  3. CKD stage III with history of right nephrectomy - monitor with diuresis.  4. H/o HLD per chart - check lipids/LFTs. She has scleral icterus thus will await liver function before re-ordering high dose pravastatin.  Signed, Charlie Pitter PA-C 01/07/2016, 4:53 PM Pager: [phone]   History and all data above reviewed.  Patient examined.  I agree with the findings as above.  All available labs, radiology testing, previous records reviewed. Agree with  documented assessment and plan. Katie [name] isa 35F with hypertension, hyperlipidemia, CKD III, admitted with acute heart failure, type unknown and mildly elevated troponin.  Katie [name] reports worsening shortness of breath for the last 3 days. She notes symptoms with exertion an  She has not noted any lower extremity edema but does feel that her abdomen is distended.  She is feeling better after one dose of IV lasix.  She did have an episode of chest discomfort that was atypical.  Troponin is mildly elevated, though it seems more consistent with demand ischemia than ACS.  We will continue to cycle cardiac enzymes and only start heparin if she develops chest pain or has a significant increase in troponin.  We will obtain an echocardiogram and continue lasix 40 mg IV bid x2 doses.  She will likely be able to start oral therapy after that.   Dwanda Tufano C. Oval Linsey, MD, [facility]  01/07/2016 6:34 PM

## 2016-01-07 NOTE — ED Provider Notes (Signed)
CSN: [id number]     Arrival date & time 01/07/16  0919 History   None    Chief Complaint  Patient presents with  . Chest Pain     (Consider location/radiation/quality/duration/timing/severity/associated sxs/prior Treatment) HPI  She began getting short of breath 2 days ago on Monday. The SOB came on suddenly when she was walking.  Yesterday morning she started having chest pain that is left sided. Chest pain is dull pain that does not radiate. Pain is constant. Coughing makes the pain worse. She's not coughing too much, not coughing more than baseline. She endorses diaphoresis since yesterday morning. No fevers, chills, abdominal pain, N/V.   No recent illnesses. She's been taking tramadol for pain in her legs that she got from Dr. Seward Carol. No cardiac issues previously. She's never had pain like this before. No long trips recently, no recent surgeries, her L foot is swollen but this is baseline, she had cancer in her kidney around 10 yrs ago (last checked out in March).   Past Medical History  Diagnosis Date  . 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 Hypertension   . Hypercholesterolemia     Past Surgical History  Procedure Laterality Date  . Nephrectomy Right     due to carcinoma  . Knee arthroscopy Left   . Hysteroscopy w/d&c N/A 04/23/2013    Procedure: DILATATION AND CURETTAGE /HYSTEROSCOPY;  Surgeon: Maeola Sarah. Landry Mellow, MD;  Location: [facility];  Service: Gynecology;  Laterality: N/A;  . Cervical polypectomy N/A 04/23/2013    Procedure: CERVICAL POLYPECTOMY;  Surgeon: Maeola Sarah. Landry Mellow, MD;  Location: [facility];  Service: Gynecology;  Laterality: N/A;  Possible Polypectomy   Family: Sister and brother with DM, CVA in younger brother, no cardiac issues in the family   Social History  Substance Use Topics  . Smoking status: Never Smoker   . Smokeless tobacco: None  . Alcohol Use: No   OB History    No data available     Review of Systems   Constitutional: Positive for diaphoresis. Negative for fever, chills, activity change, appetite change and fatigue.  HENT: Negative for congestion, facial swelling, nosebleeds, postnasal drip and sinus pressure.   Eyes: Negative for photophobia and pain.  Respiratory: Positive for cough, chest tightness and shortness of breath. Negative for apnea, wheezing and stridor.   Cardiovascular: Positive for chest pain. Negative for palpitations.  Gastrointestinal: Negative for nausea, vomiting, abdominal pain, diarrhea and abdominal distention.  Endocrine: Negative.   Genitourinary: Negative.   Musculoskeletal: Negative.   Skin: Negative for rash.  Allergic/Immunologic: Negative.   Neurological: Positive for headaches. Negative for dizziness, syncope, weakness, light-headedness and numbness.  Hematological: Negative.   Psychiatric/Behavioral: Negative.       Allergies  Review of patient's allergies indicates no known allergies.  Home Medications   Prior to Admission medications   Medication Sig Start Date End Date Taking? Authorizing Provider  azithromycin (ZITHROMAX Z-PAK) 250 MG tablet Take 1 tablet (250 mg total) by mouth daily. 500mg  PO day 1, then 250mg  PO days 205 04/09/14   Antonietta Breach, PA-C  benzonatate (TESSALON) 100 MG capsule Take 1 capsule (100 mg total) by mouth 3 (three) times daily as needed for cough. 04/09/14   Antonietta Breach, PA-C  doxycycline (VIBRAMYCIN) 50 MG capsule Take 2 capsules (100 mg total) by mouth daily. 04/23/13   Christophe Louis, MD  HYDROcodone-acetaminophen (NORCO) 5-325 MG per tablet 1-2 po every 6 hours prn  04/23/13   Christophe Louis, MD  ibuprofen (ADVIL,MOTRIN) 600 MG tablet Take 1 tablet (600 mg total) by mouth every 6 (six) hours as needed for pain. 04/23/13   Christophe Louis, MD  medroxyPROGESTERone (PROVERA) 10 MG tablet Take 10 mg by mouth daily.    Historical Provider, MD  pravastatin (PRAVACHOL) 80 MG tablet Take 80 mg by mouth daily.    Historical Provider, MD  predniSONE  (DELTASONE) 20 MG tablet Take 2 tablets (40 mg total) by mouth daily. 04/09/14   Antonietta Breach, PA-C   BP 129/92 mmHg  Pulse 82  Temp(Src) 98.1 F (36.7 C) (Oral)  Resp 17  SpO2 97%  LMP 02/12/2013 Physical Exam  Constitutional: She is oriented to person, place, and time. She appears well-developed and well-nourished. No distress.  HENT:  Head: Normocephalic.  Nose: Nose normal.  Mouth/Throat: Oropharynx is clear and moist. No oropharyngeal exudate.  Eyes: Conjunctivae are normal. Right eye exhibits no discharge. Left eye exhibits no discharge. No scleral icterus.  Neck: Normal range of motion. Neck supple.  Cardiovascular: Normal rate, regular rhythm and normal heart sounds.  Exam reveals no gallop and no friction rub.   No murmur heard. Pulmonary/Chest: Effort normal and breath sounds normal. No respiratory distress. She has no wheezes. She has no rales. She exhibits no tenderness.  Abdominal: Soft. Bowel sounds are normal. She exhibits no distension. There is no tenderness. There is no rebound and no guarding.  Musculoskeletal: Normal range of motion. She exhibits no edema.  Neurological: She is alert and oriented to person, place, and time. She exhibits normal muscle tone.  Skin: Skin is warm. No rash noted. She is not diaphoretic.  Psychiatric: She has a normal mood and affect.    ED Course  Procedures (including critical care time) Labs Review Labs Reviewed  BASIC METABOLIC PANEL - Abnormal; Notable for the following:    Chloride 112 (*)    Glucose, Bld 100 (*)    Creatinine, Ser 1.37 (*)    GFR calc non Af Amer 38 (*)    GFR calc Af Amer 44 (*)    All other components within normal limits  TROPONIN I - Abnormal; Notable for the following:    Troponin I 0.05 (*)    All other components within normal limits  D-DIMER, QUANTITATIVE (NOT AT [facility]) - Abnormal; Notable for the following:    D-Dimer, Quant 1.96 (*)    All other components within normal limits  BRAIN NATRIURETIC  PEPTIDE - Abnormal; Notable for the following:    B Natriuretic Peptide 425.2 (*)    All other components within normal limits  CBC WITH DIFFERENTIAL/PLATELET  TROPONIN I  TROPONIN I  TROPONIN I  I-STAT TROPOININ, ED    Imaging Review Dg Chest 2 View  01/07/2016  CLINICAL DATA:  70 year old female with shortness of breath chest pain and wheezing for 3 days. Initial encounter. EXAM: CHEST  2 VIEW COMPARISON:  10/17/2015 and earlier. FINDINGS: Stable cardiomegaly and mediastinal contours. Small bilateral pleural effusions are new. Mildly increased interstitial markings in both lungs. No pneumothorax or other acute pulmonary opacity. No acute osseous abnormality identified. Stable cholecystectomy clips. IMPRESSION: Chronic cardiomegaly with increased interstitial opacity and new small pleural effusions. Favor acute interstitial edema with main differential consideration of viral/ atypical respiratory infection. Electronically Signed   By: Genevie Ann M.D.   On: 01/07/2016 10:35   Nm Pulmonary Perf And Vent  01/07/2016  CLINICAL DATA:  Shortness of breath and chest pain for  2 days, history hypertension, chronic kidney disease EXAM: NUCLEAR MEDICINE VENTILATION - PERFUSION LUNG SCAN TECHNIQUE: Ventilation images were obtained in multiple projections using inhaled aerosol Tc-50m DTPA. Perfusion images were obtained in multiple projections after intravenous injection of Tc-39m MAA. RADIOPHARMACEUTICALS:  31.6 mCi Technetium-29m DTPA aerosol inhalation and 4.1 mCi Technetium-62m MAA IV COMPARISON:  None Correlation:  Chest radiograph 01/07/2016 FINDINGS: Ventilation: Aerosol deposition within trachea. Swallowed aerosol within stomach. Subsegmental ventilation defect laterally RIGHT mid lung likely RIGHT middle lobe. Enlargement of cardiac silhouette. No additional segmental or subsegmental perfusion defects. Perfusion: Matching subsegmental diminished perfusion lateral RIGHT middle lobe. No additional significant  perfusion abnormalities. Chest radiograph: Enlargement of cardiac silhouette with pulmonary vascular congestion and question mild pulmonary edema. IMPRESSION: Matching subsegmental diminished ventilation and perfusion in lateral RIGHT middle lobe. Low probability of pulmonary embolism. Electronically Signed   By: Lavonia Dana M.D.   On: 01/07/2016 13:54   I have personally reviewed and evaluated these images and lab results as part of my medical decision-making.   EKG Interpretation   Date/Time:  Wednesday January 07 2016 09:23:29 EDT Ventricular Rate:  97 PR Interval:  130 QRS Duration: 105 QT Interval:  387 QTC Calculation: 492 R Axis:   64 Text Interpretation:  Sinus rhythm Nonspecific T wave abnormality Baseline  wander in lead(s) II III aVF Confirmed by DELO  MD, DOUGLAS ([id number]) on  01/07/2016 9:29:49 AM      MDM   Final diagnoses:  SOB (shortness of breath)  Chest pain    Ms. Heiberg is a 70 y/o female with a PMHx of HLD presenting with sudden onset SOB and chest pain. She has a h/o renal cancer, however is in remission per her report. HRs are in the 90s and she's satting well on RA.  EKG unchanged and i stat troponin negative.  CXR obtained. No infectious symptoms. Patient breathing comfortably on RA.   Obtained d-dimer given her h/o malignancy and sudden onset of SOB.   Troponin mildly elevated at 0.05, I suspect from renal dysfunction. Will trend troponins.   CXR with cardiomegaly and increased interstitial opacity and new small pleural effusions with main DDx of viral/atypical respiratory infection. No echocardiograms on file. Obtained BNP.  D-dimer elevated to 1.96. Given h/o nephrectomy and decreased renal function, VQ scan ordered.  VQ scan with matching subsegmental diminished ventilation and perfusion in lateral R middle lobe. Low probability of PE.   BNP elevated to 425. Given Lasxi 20mg  IV. Consulted cardiology to come see the patient.   Care transferred to attending  physician, Dr. Stark Jock. Cardiology consultation pending. Patient continuing to breath comfortably.  Archie Patten, MD [facility] Resident  01/07/2016, 4:14 PM     Archie Patten, MD 01/07/16 [place], MD 01/08/16 [phone]

## 2016-01-07 NOTE — ED Notes (Signed)
Pt reports to the ED via [facility] from [place] for eval of left sided CP x 2 days. Describes it as a tightness. Pain increases to palpation. Reports associated symptoms of exertional SOB and generalized weakness. She has been having a dry cough x several days as well. 12 lead en route unremarkable. She received 1 nitro and 324 of ASA PTA. The nitro took her pain from an 8/10 to a 6/10. Pt denies any cardiac hx. Pt A&Ox4, resp e/u, and skin warm and dry.

## 2016-01-08 ENCOUNTER — Encounter (HOSPITAL_COMMUNITY): Payer: Self-pay | Admitting: Cardiovascular Disease

## 2016-01-08 ENCOUNTER — Observation Stay (HOSPITAL_BASED_OUTPATIENT_CLINIC_OR_DEPARTMENT_OTHER): Payer: PPO

## 2016-01-08 DIAGNOSIS — N183 Chronic kidney disease, stage 3 (moderate): Secondary | ICD-10-CM | POA: Diagnosis not present

## 2016-01-08 DIAGNOSIS — I071 Rheumatic tricuspid insufficiency: Secondary | ICD-10-CM

## 2016-01-08 DIAGNOSIS — I509 Heart failure, unspecified: Secondary | ICD-10-CM | POA: Diagnosis not present

## 2016-01-08 DIAGNOSIS — I425 Other restrictive cardiomyopathy: Secondary | ICD-10-CM

## 2016-01-08 DIAGNOSIS — I5031 Acute diastolic (congestive) heart failure: Secondary | ICD-10-CM

## 2016-01-08 DIAGNOSIS — E785 Hyperlipidemia, unspecified: Secondary | ICD-10-CM | POA: Diagnosis not present

## 2016-01-08 DIAGNOSIS — I272 Other secondary pulmonary hypertension: Secondary | ICD-10-CM

## 2016-01-08 DIAGNOSIS — R7989 Other specified abnormal findings of blood chemistry: Secondary | ICD-10-CM | POA: Diagnosis not present

## 2016-01-08 DIAGNOSIS — I340 Nonrheumatic mitral (valve) insufficiency: Secondary | ICD-10-CM

## 2016-01-08 DIAGNOSIS — I10 Essential (primary) hypertension: Secondary | ICD-10-CM | POA: Diagnosis not present

## 2016-01-08 LAB — ECHOCARDIOGRAM COMPLETE
E decel time: 201 msec
E/e' ratio: 13.05
FS: 23 % — AB (ref 28–44)
Height: 64 in
IVS/LV PW RATIO, ED: 0.88
LA ID, A-P, ES: 47 mm
LA diam end sys: 47 mm
LA diam index: 2.44 cm/m2
LA vol A4C: 68.2 ml
LA vol index: 47.7 mL/m2
LA vol: 91.8 mL
LV E/e' medial: 13.05
LV E/e'average: 13.05
LV PW d: 13 mm — AB (ref 0.6–1.1)
LV e' LATERAL: 8.81 cm/s
LVOT area: 2.27 cm2
LVOT diameter: 17 mm
Lateral S' vel: 10.9 cm/s
MV Dec: 201
MV Peak grad: 5 mmHg
MV pk A vel: 36.4 m/s
MV pk E vel: 115 m/s
PISA EROA: 0.12 cm2
RV sys press: 61 mmHg
Reg peak vel: 356 cm/s
TAPSE: 15.6 mm
TDI e' lateral: 8.81
TDI e' medial: 5
TR max vel: 356 cm/s
VTI: 153 cm
Weight: 2816 oz

## 2016-01-08 LAB — GLUCOSE, CAPILLARY
Glucose-Capillary: 80 mg/dL (ref 65–99)
Glucose-Capillary: 84 mg/dL (ref 65–99)

## 2016-01-08 LAB — BASIC METABOLIC PANEL
Anion gap: 9 (ref 5–15)
BUN: 13 mg/dL (ref 6–20)
CO2: 25 mmol/L (ref 22–32)
Calcium: 9.4 mg/dL (ref 8.9–10.3)
Chloride: 106 mmol/L (ref 101–111)
Creatinine, Ser: 1.41 mg/dL — ABNORMAL HIGH (ref 0.44–1.00)
GFR calc Af Amer: 43 mL/min — ABNORMAL LOW (ref >60)
GFR calc non Af Amer: 37 mL/min — ABNORMAL LOW (ref >60)
Glucose, Bld: 89 mg/dL (ref 65–99)
Potassium: 3.4 mmol/L — ABNORMAL LOW (ref 3.5–5.1)
Sodium: 140 mmol/L (ref 135–145)

## 2016-01-08 LAB — MRSA PCR SCREENING: MRSA by PCR: NEGATIVE

## 2016-01-08 LAB — TROPONIN I: Troponin I: 0.07 ng/mL — ABNORMAL HIGH (ref <0.031)

## 2016-01-08 LAB — TSH: TSH: 3.305 u[IU]/mL (ref 0.350–4.500)

## 2016-01-08 MED ORDER — FUROSEMIDE 10 MG/ML IJ SOLN
40.0000 mg | Freq: Two times a day (BID) | INTRAMUSCULAR | Status: AC
  Administered 2016-01-08 (×2): 40 mg via INTRAVENOUS
  Filled 2016-01-08 (×2): qty 4

## 2016-01-08 MED ORDER — HEPARIN SODIUM (PORCINE) 5000 UNIT/ML IJ SOLN
5000.0000 [IU] | Freq: Three times a day (TID) | INTRAMUSCULAR | Status: DC
  Administered 2016-01-08 – 2016-01-12 (×11): 5000 [IU] via SUBCUTANEOUS
  Filled 2016-01-08 (×11): qty 1

## 2016-01-08 MED ORDER — POTASSIUM CHLORIDE CRYS ER 20 MEQ PO TBCR
40.0000 meq | EXTENDED_RELEASE_TABLET | Freq: Two times a day (BID) | ORAL | Status: AC
Start: 2016-01-08 — End: 2016-01-08
  Administered 2016-01-08 (×2): 40 meq via ORAL
  Filled 2016-01-08 (×2): qty 2

## 2016-01-08 NOTE — Care Management Obs Status (Signed)
[place] NOTIFICATION   Patient Details  Name: Katie [name] MRN: [id number] Date of Birth: 1945/08/16   Medicare Observation Status Notification Given:  Yes    Lacretia Leigh, RN 01/08/2016, 11:24 AM

## 2016-01-08 NOTE — Progress Notes (Signed)
Patient ID: Katie [name], female   DOB: [birth year], 70 y.o.   MRN: [id number] Discussed with Dr Oval Linsey Concern for NSF given solitary kidney and GFR only 69 Favor right and left cath for hemodynamics if restriction a concern Would not give gadolinium and MRI not very useful incremental info From echo without it  Jenkins Rouge

## 2016-01-08 NOTE — Progress Notes (Signed)
PATIENT ID: Katie [name] is a 24F with CKD stage III, HTN, and HLD here with acute onset of diastolic heart failure.  SUBJECTIVE:  Katie [name] is feeling better but continues to note dyspnea on exertion.  She is no longer wheezing.    PHYSICAL EXAM Filed Vitals:   01/08/16 0500 01/08/16 0700 01/08/16 0814 01/08/16 1104  BP: 117/74  146/85 147/97  Pulse: 78  82 89  Temp:   98 F (36.7 C) 98.1 F (36.7 C)  TempSrc:   Oral Oral  Resp:  16 18 20   Height:      Weight:      SpO2: 90%  97% 97%   General:  Well-appearing.  No acute distress.  Neck: JVP 2 cm above the clavicle at 45 degrees.   Lungs:  CTAB. Heart:  RRR.  No m/r/g.  II/VI holosystolic murmur at the apex Abdomen:  Soft, NT, ND.  +BS Extremities:  WWP.  No edema   LABS: Lab Results  Component Value Date   TROPONINI 0.07* 01/08/2016   Results for orders placed or performed during the hospital encounter of 01/07/16 (from the past 24 hour(s))  Brain natriuretic peptide     Status: Abnormal   Collection Time: 01/07/16 11:43 AM  Result Value Ref Range   B Natriuretic Peptide 425.2 (H) 0.0 - 100.0 pg/mL  Troponin I (q 6hr x 3)     Status: Abnormal   Collection Time: 01/07/16  3:53 PM  Result Value Ref Range   Troponin I 0.06 (H) <0.031 ng/mL  Troponin I (q 6hr x 3)     Status: Abnormal   Collection Time: 01/07/16  9:31 PM  Result Value Ref Range   Troponin I 0.06 (H) <0.031 ng/mL  Hepatic function panel     Status: Abnormal   Collection Time: 01/07/16  9:31 PM  Result Value Ref Range   Total Protein 6.8 6.5 - 8.1 g/dL   Albumin 3.5 3.5 - 5.0 g/dL   AST 20 15 - 41 U/L   ALT 22 14 - 54 U/L   Alkaline Phosphatase 81 38 - 126 U/L   Total Bilirubin 1.5 (H) 0.3 - 1.2 mg/dL   Bilirubin, Direct 0.3 0.1 - 0.5 mg/dL   Indirect Bilirubin 1.2 (H) 0.3 - 0.9 mg/dL  Protime-INR     Status: None   Collection Time: 01/07/16  9:31 PM  Result Value Ref Range   Prothrombin Time 14.0 11.6 - 15.2 seconds   INR 1.06 0.00 - 1.49    Troponin I (q 6hr x 3)     Status: Abnormal   Collection Time: 01/08/16  2:44 AM  Result Value Ref Range   Troponin I 0.07 (H) <0.031 ng/mL  MRSA PCR Screening     Status: None   Collection Time: 01/08/16  3:18 AM  Result Value Ref Range   MRSA by PCR NEGATIVE NEGATIVE  Basic metabolic panel     Status: Abnormal   Collection Time: 01/08/16  4:20 AM  Result Value Ref Range   Sodium 140 135 - 145 mmol/L   Potassium 3.4 (L) 3.5 - 5.1 mmol/L   Chloride 106 101 - 111 mmol/L   CO2 25 22 - 32 mmol/L   Glucose, Bld 89 65 - 99 mg/dL   BUN 13 6 - 20 mg/dL   Creatinine, Ser 1.41 (H) 0.44 - 1.00 mg/dL   Calcium 9.4 8.9 - 10.3 mg/dL   GFR calc non Af Amer 37 (L) >60 mL/min  GFR calc Af Amer 43 (L) >60 mL/min   Anion gap 9 5 - 15  TSH     Status: None   Collection Time: 01/08/16  4:20 AM  Result Value Ref Range   TSH 3.305 0.350 - 4.500 uIU/mL  Glucose, capillary     Status: None   Collection Time: 01/08/16  8:15 AM  Result Value Ref Range   Glucose-Capillary 80 65 - 99 mg/dL   Comment 1 Notify RN     Intake/Output Summary (Last 24 hours) at 01/08/16 1108 Last data filed at 01/08/16 1100  Gross per 24 hour  Intake    320 ml  Output   2450 ml  Net  -2130 ml    Telemetry: sinus rhythm with occasional PVCs.   Echo 01/08/16: Study Conclusions  - Left ventricle: The cavity size was normal. There was mild  concentric hypertrophy. Systolic function was normal. The  estimated ejection fraction was in the range of 60% to 65%. Wall  motion was normal; there were no regional wall motion  abnormalities. There was a reduced contribution of atrial  contraction to ventricular filling, due to increased ventricular  diastolic pressure or atrial contractile dysfunction. Doppler  parameters are consistent with a reversible restrictive pattern,  indicative of decreased left ventricular diastolic compliance  and/or increased left atrial pressure (grade 3 diastolic  dysfunction). -  Aortic valve: Trileaflet; mildly thickened, mildly calcified  leaflets. - Mitral valve: Trivial, late systolicprolapse, involving the  anterior leaflet. There was moderate regurgitation directed  eccentrically and posteriorly. - Left atrium: The atrium was severely dilated. - Right atrium: The atrium was mildly dilated. - Tricuspid valve: There was mild-moderate regurgitation. - Pulmonic valve: There was trivial regurgitation. - Pulmonary arteries: PA peak pressure: 54 mm Hg (S). - Impressions: The right ventricular systolic pressure was  increased consistent with moderate pulmonary hypertension.  Impressions:  - The right ventricular systolic pressure was increased consistent  with moderate pulmonary hypertension.  ASSESSMENT AND PLAN:  Principal Problem:   Acute CHF (congestive heart failure) (HCC) Active Problems:   Elevated troponin   Mild hypertension   CKD (chronic kidney disease), stage III   Hyperlipidemia   Acute CHF ([place])   # Acute diastolic heart failure:  Newly diagnosed this admission. She was also noted to have mild-moderate TR, moderate MR and moderate pulmonary hypertension.  Pulmonary hypertension is likely 2./2 left sided disease.  Katie [name] was noted to have normal systolic function with grade 3 diastolic dysfunction.  She does not have significant hypertension and only has grade 1 diastolic dysfunction.  This is suggestive of restrictive cardiomyopathy.  It does not have the characteristic echo appearance of hypereosinophilia, amyloidosis or sarcoidosis.  We will check SPEP, UPEP, and a CBC with differential.  Also, cardiac MRI to better evaluate for infiltrative disease.  Continue lasix 40 mg IV bid.  BP is mildy elevated.  We will start metoprolol 12.5 mg bid.  No ACE-I/ARB given her renal dysfunction and active diuresis.  If she does poorly with metoprolol it may be due to her grade 3 diastolic dysfunction and minimal atrial contribution.   #  Hyperlipidemia: She carries a diagnosis of hyperlipidemia but there are no lipids in our system and she is not on any medications.  We will check fasting lipids.   # Hypertension: Metoprolol 12.5 mg bid as above.    Lazarus Sudbury C. Oval Linsey, MD, [facility] 01/08/2016 11:08 AM

## 2016-01-08 NOTE — Progress Notes (Signed)
  Echocardiogram 2D Echocardiogram has been performed.  Katie [name] 01/08/2016, 10:38 AM

## 2016-01-09 DIAGNOSIS — R7989 Other specified abnormal findings of blood chemistry: Secondary | ICD-10-CM | POA: Diagnosis not present

## 2016-01-09 DIAGNOSIS — I509 Heart failure, unspecified: Secondary | ICD-10-CM | POA: Diagnosis not present

## 2016-01-09 DIAGNOSIS — E785 Hyperlipidemia, unspecified: Secondary | ICD-10-CM | POA: Diagnosis not present

## 2016-01-09 DIAGNOSIS — I10 Essential (primary) hypertension: Secondary | ICD-10-CM | POA: Diagnosis not present

## 2016-01-09 DIAGNOSIS — I5031 Acute diastolic (congestive) heart failure: Secondary | ICD-10-CM | POA: Diagnosis not present

## 2016-01-09 DIAGNOSIS — N183 Chronic kidney disease, stage 3 (moderate): Secondary | ICD-10-CM | POA: Diagnosis not present

## 2016-01-09 LAB — CBC WITH DIFFERENTIAL/PLATELET
Basophils Absolute: 0 10*3/uL (ref 0.0–0.1)
Basophils Relative: 1 %
Eosinophils Absolute: 0 10*3/uL (ref 0.0–0.7)
Eosinophils Relative: 0 %
HCT: 45.3 % (ref 36.0–46.0)
Hemoglobin: 14.8 g/dL (ref 12.0–15.0)
Lymphocytes Relative: 38 %
Lymphs Abs: 1.6 10*3/uL (ref 0.7–4.0)
MCH: 29.3 pg (ref 26.0–34.0)
MCHC: 32.7 g/dL (ref 30.0–36.0)
MCV: 89.7 fL (ref 78.0–100.0)
Monocytes Absolute: 0.4 10*3/uL (ref 0.1–1.0)
Monocytes Relative: 10 %
Neutro Abs: 2.1 10*3/uL (ref 1.7–7.7)
Neutrophils Relative %: 51 %
Platelets: 323 10*3/uL (ref 150–400)
RBC: 5.05 MIL/uL (ref 3.87–5.11)
RDW: 13.5 % (ref 11.5–15.5)
WBC: 4.1 10*3/uL (ref 4.0–10.5)

## 2016-01-09 LAB — BASIC METABOLIC PANEL
Anion gap: 9 (ref 5–15)
BUN: 23 mg/dL — ABNORMAL HIGH (ref 6–20)
CO2: 28 mmol/L (ref 22–32)
Calcium: 9.8 mg/dL (ref 8.9–10.3)
Chloride: 104 mmol/L (ref 101–111)
Creatinine, Ser: 1.64 mg/dL — ABNORMAL HIGH (ref 0.44–1.00)
GFR calc Af Amer: 36 mL/min — ABNORMAL LOW (ref >60)
GFR calc non Af Amer: 31 mL/min — ABNORMAL LOW (ref >60)
Glucose, Bld: 98 mg/dL (ref 65–99)
Potassium: 4.1 mmol/L (ref 3.5–5.1)
Sodium: 141 mmol/L (ref 135–145)

## 2016-01-09 LAB — LIPID PANEL
Cholesterol: 172 mg/dL (ref 0–200)
HDL: 46 mg/dL (ref >40)
LDL Cholesterol: 111 mg/dL — ABNORMAL HIGH (ref 0–99)
Total CHOL/HDL Ratio: 3.7 RATIO
Triglycerides: 77 mg/dL (ref <150)
VLDL: 15 mg/dL (ref 0–40)

## 2016-01-09 MED ORDER — METOPROLOL TARTRATE 12.5 MG HALF TABLET
12.5000 mg | ORAL_TABLET | Freq: Two times a day (BID) | ORAL | Status: DC
  Administered 2016-01-09 – 2016-01-13 (×9): 12.5 mg via ORAL
  Filled 2016-01-09 (×9): qty 1

## 2016-01-09 NOTE — H&P (Signed)
Order to transfer to [place] acknowledged. Rm 3E27 available. Pt informed and verbalized understanding. Report called to [place]. Pt's belongings packed, transported via wheelchair with belongings. Daughter informed by Pt of new room.VSS prior to transfer.

## 2016-01-09 NOTE — Progress Notes (Signed)
1600 transferred in from [facility] tNo  Apparent distress .Kept comfortable in bed Safety precaution observed

## 2016-01-09 NOTE — Progress Notes (Signed)
     I was called to update the family. After discussion with Miss Seaver and her family it was decided that she was willing to stay in the hospital over the weekend for left and right heart catheterization on Monday. We will transfer her to the floor today and I will set up heart cath on Monday with Dr. Tamala Julian. Orders have been placed she'll be nothing by mouth after midnight Sunday night.   Angelena Form PA-C  [facility]

## 2016-01-09 NOTE — Progress Notes (Signed)
PATIENT ID: Katie [name] is a 60F with CKD stage III, HTN, and HLD here with acute onset of diastolic heart failure.  SUBJECTIVE: Feeling well. Breathing has improved.   PHYSICAL EXAM Filed Vitals:   01/08/16 1925 01/08/16 2312 01/09/16 0331 01/09/16 0735  BP: 118/75 130/83 128/90 121/84  Pulse: 89 81 82   Temp: 97.6 F (36.4 C) 97.6 F (36.4 C) 97.4 F (36.3 C)   TempSrc: Oral Oral Oral   Resp: 18 18    Height:      Weight:   172 lb 6.4 oz (78.2 kg)   SpO2: 96% 96% 98%    General:  Well-appearing.  No acute distress.  Neck: No JVD at 45 degrees.   Lungs:  CTAB. Heart:  RRR.  No m/r/g.  II/VI holosystolic murmur at the apex Abdomen:  Soft, NT, ND.  +BS Extremities:  WWP.  No edema   LABS: Lab Results  Component Value Date   TROPONINI 0.07* 01/08/2016   Results for orders placed or performed during the hospital encounter of 01/07/16 (from the past 24 hour(s))  Glucose, capillary     Status: None   Collection Time: 01/08/16  8:15 AM  Result Value Ref Range   Glucose-Capillary 80 65 - 99 mg/dL   Comment 1 Notify RN   Glucose, capillary     Status: None   Collection Time: 01/08/16 11:14 PM  Result Value Ref Range   Glucose-Capillary 84 65 - 99 mg/dL   Comment 1 Capillary Specimen   Basic metabolic panel     Status: Abnormal   Collection Time: 01/09/16  3:27 AM  Result Value Ref Range   Sodium 141 135 - 145 mmol/L   Potassium 4.1 3.5 - 5.1 mmol/L   Chloride 104 101 - 111 mmol/L   CO2 28 22 - 32 mmol/L   Glucose, Bld 98 65 - 99 mg/dL   BUN 23 (H) 6 - 20 mg/dL   Creatinine, Ser 1.64 (H) 0.44 - 1.00 mg/dL   Calcium 9.8 8.9 - 10.3 mg/dL   GFR calc non Af Amer 31 (L) >60 mL/min   GFR calc Af Amer 36 (L) >60 mL/min   Anion gap 9 5 - 15  CBC with Differential/Platelet     Status: None   Collection Time: 01/09/16  3:27 AM  Result Value Ref Range   WBC 4.1 4.0 - 10.5 K/uL   RBC 5.05 3.87 - 5.11 MIL/uL   Hemoglobin 14.8 12.0 - 15.0 g/dL   HCT 45.3 36.0 - 46.0 %   MCV  89.7 78.0 - 100.0 fL   MCH 29.3 26.0 - 34.0 pg   MCHC 32.7 30.0 - 36.0 g/dL   RDW 13.5 11.5 - 15.5 %   Platelets 323 150 - 400 K/uL   Neutrophils Relative % 51 %   Neutro Abs 2.1 1.7 - 7.7 K/uL   Lymphocytes Relative 38 %   Lymphs Abs 1.6 0.7 - 4.0 K/uL   Monocytes Relative 10 %   Monocytes Absolute 0.4 0.1 - 1.0 K/uL   Eosinophils Relative 0 %   Eosinophils Absolute 0.0 0.0 - 0.7 K/uL   Basophils Relative 1 %   Basophils Absolute 0.0 0.0 - 0.1 K/uL  Lipid panel     Status: Abnormal   Collection Time: 01/09/16  3:27 AM  Result Value Ref Range   Cholesterol 172 0 - 200 mg/dL   Triglycerides 77 <150 mg/dL   HDL 46 >40 mg/dL   Total CHOL/HDL Ratio  3.7 RATIO   VLDL 15 0 - 40 mg/dL   LDL Cholesterol 111 (H) 0 - 99 mg/dL    Intake/Output Summary (Last 24 hours) at 01/09/16 0753 Last data filed at 01/09/16 0352  Gross per 24 hour  Intake    550 ml  Output   3150 ml  Net  -2600 ml    Telemetry: sinus rhythm with occasional PVCs.   Echo 01/08/16: Study Conclusions  - Left ventricle: The cavity size was normal. There was mild  concentric hypertrophy. Systolic function was normal. The  estimated ejection fraction was in the range of 60% to 65%. Wall  motion was normal; there were no regional wall motion  abnormalities. There was a reduced contribution of atrial  contraction to ventricular filling, due to increased ventricular  diastolic pressure or atrial contractile dysfunction. Doppler  parameters are consistent with a reversible restrictive pattern,  indicative of decreased left ventricular diastolic compliance  and/or increased left atrial pressure (grade 3 diastolic  dysfunction). - Aortic valve: Trileaflet; mildly thickened, mildly calcified  leaflets. - Mitral valve: Trivial, late systolicprolapse, involving the  anterior leaflet. There was moderate regurgitation directed  eccentrically and posteriorly. - Left atrium: The atrium was severely dilated. -  Right atrium: The atrium was mildly dilated. - Tricuspid valve: There was mild-moderate regurgitation. - Pulmonic valve: There was trivial regurgitation. - Pulmonary arteries: PA peak pressure: 54 mm Hg (S). - Impressions: The right ventricular systolic pressure was  increased consistent with moderate pulmonary hypertension.  Impressions:  - The right ventricular systolic pressure was increased consistent  with moderate pulmonary hypertension.  ASSESSMENT AND PLAN:  Principal Problem:   Acute CHF (congestive heart failure) (HCC) Active Problems:   Elevated troponin   Mild hypertension   CKD (chronic kidney disease), stage III   Hyperlipidemia   Acute CHF (HCC)   Moderate mitral regurgitation   Moderate tricuspid regurgitation   Moderate to severe pulmonary hypertension ([place])   # Acute diastolic heart failure:  Newly diagnosed this admission. She was also noted to have mild-moderate TR, moderate MR and moderate pulmonary hypertension.  Pulmonary hypertension is likely 2/2 left sided disease.  Katie [name] was noted to have normal systolic function with grade 3 diastolic dysfunction.  She does not have significant hypertension and only has grade 1 diastolic dysfunction.  This is suggestive of restrictive cardiomyopathy.  It does not have the characteristic echo appearance of hypereosinophilia, amyloidosis or sarcoidosis. SPEP pending.  She does not have an increased eosinophil count.  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  Renal function is worse today with diruesis.  We will not give additional lasix today and likely switch to oral tomorrow.  Start metoprolol 12.5 mg bid.  Plan for [facility] as an outpatient for evaluation of restrictive physiology.  She is anxious to go home and does not what this done on Monday.  # Hyperlipidemia: LDL 111.  ASCVD 10 year risk is 9%.  Will wait until [place] before deciding on therapy.   # Hypertension:  Metoprolol 12.5 mg bid as above.    Katie Depaoli C. Oval Linsey, MD, [facility] 01/09/2016 7:53 AM

## 2016-01-10 DIAGNOSIS — I5031 Acute diastolic (congestive) heart failure: Secondary | ICD-10-CM | POA: Diagnosis not present

## 2016-01-10 LAB — BASIC METABOLIC PANEL
Anion gap: 9 (ref 5–15)
BUN: 28 mg/dL — ABNORMAL HIGH (ref 6–20)
CO2: 26 mmol/L (ref 22–32)
Calcium: 9.5 mg/dL (ref 8.9–10.3)
Chloride: 102 mmol/L (ref 101–111)
Creatinine, Ser: 1.58 mg/dL — ABNORMAL HIGH (ref 0.44–1.00)
GFR calc Af Amer: 37 mL/min — ABNORMAL LOW (ref >60)
GFR calc non Af Amer: 32 mL/min — ABNORMAL LOW (ref >60)
Glucose, Bld: 90 mg/dL (ref 65–99)
Potassium: 4.9 mmol/L (ref 3.5–5.1)
Sodium: 137 mmol/L (ref 135–145)

## 2016-01-10 NOTE — Progress Notes (Signed)
Refused bed alarm. Will continue to monitor patient. 

## 2016-01-10 NOTE — Progress Notes (Signed)
Patient ID: Katie [name], female   DOB: 04/04/1946, 70 y.o.   MRN: [id number]   PATIENT ID: Katie [name] is a 70F with CKD stage III, HTN, and HLD here with acute onset of diastolic heart failure.  SUBJECTIVE: Sitting in chair with no dyspnea .   PHYSICAL EXAM Filed Vitals:   01/09/16 1611 01/09/16 2131 01/10/16 0543 01/10/16 1225  BP: 123/79 110/67 120/77 103/64  Pulse: 70 84 81 71  Temp: 97.8 F (36.6 C) 98.2 F (36.8 C) 98 F (36.7 C) 97.8 F (36.6 C)  TempSrc: Oral Oral Oral Oral  Resp: 18 18 18 18   Height: 5\' 4"  (1.626 m)     Weight: 79.334 kg (174 lb 14.4 oz)  79.107 kg (174 lb 6.4 oz)   SpO2: 100% 98% 98% 98%   General:  Well-appearing.  No acute distress.  Neck: No JVD at 45 degrees.   Lungs:  CTAB. Heart:  RRR.  No m/r/g.  II/VI holosystolic murmur at the apex Abdomen:  Soft, NT, ND.  +BS Extremities:  WWP.  No edema   LABS: Lab Results  Component Value Date   TROPONINI 0.07* 01/08/2016   Results for orders placed or performed during the hospital encounter of 01/07/16 (from the past 24 hour(s))  Basic metabolic panel     Status: Abnormal   Collection Time: 01/10/16  3:14 AM  Result Value Ref Range   Sodium 137 135 - 145 mmol/L   Potassium 4.9 3.5 - 5.1 mmol/L   Chloride 102 101 - 111 mmol/L   CO2 26 22 - 32 mmol/L   Glucose, Bld 90 65 - 99 mg/dL   BUN 28 (H) 6 - 20 mg/dL   Creatinine, Ser 1.58 (H) 0.44 - 1.00 mg/dL   Calcium 9.5 8.9 - 10.3 mg/dL   GFR calc non Af Amer 32 (L) >60 mL/min   GFR calc Af Amer 37 (L) >60 mL/min   Anion gap 9 5 - 15    Intake/Output Summary (Last 24 hours) at 01/10/16 1248 Last data filed at 01/10/16 0900  Gross per 24 hour  Intake    700 ml  Output    900 ml  Net   -200 ml    Telemetry: sinus rhythm with occasional PVCs.   Echo 01/08/16: Study Conclusions  - Left ventricle: The cavity size was normal. There was mild  concentric hypertrophy. Systolic function was normal. The  estimated ejection fraction was in the  range of 60% to 65%. Wall  motion was normal; there were no regional wall motion  abnormalities. There was a reduced contribution of atrial  contraction to ventricular filling, due to increased ventricular  diastolic pressure or atrial contractile dysfunction. Doppler  parameters are consistent with a reversible restrictive pattern,  indicative of decreased left ventricular diastolic compliance  and/or increased left atrial pressure (grade 3 diastolic  dysfunction). - Aortic valve: Trileaflet; mildly thickened, mildly calcified  leaflets. - Mitral valve: Trivial, late systolicprolapse, involving the  anterior leaflet. There was moderate regurgitation directed  eccentrically and posteriorly. - Left atrium: The atrium was severely dilated. - Right atrium: The atrium was mildly dilated. - Tricuspid valve: There was mild-moderate regurgitation. - Pulmonic valve: There was trivial regurgitation. - Pulmonary arteries: PA peak pressure: 54 mm Hg (S). - Impressions: The right ventricular systolic pressure was  increased consistent with moderate pulmonary hypertension.  Impressions:  - The right ventricular systolic pressure was increased consistent  with moderate pulmonary hypertension.  ASSESSMENT AND PLAN:  Principal Problem:   Acute CHF (congestive heart failure) (HCC) Active Problems:   Elevated troponin   Mild hypertension   CKD (chronic kidney disease), stage III   Hyperlipidemia   Acute CHF (HCC)   Moderate mitral regurgitation   Moderate tricuspid regurgitation   Moderate to severe pulmonary hypertension ([place])   # Acute diastolic heart failure:  New diagnosis.  Significant MR  Unable to get MRI to r/o restrictive DCM due to low GFR , single kidney and risk of NFS.  Continue diuresis.  Plan per TR is for right and left heart cath on Monday with Dr Katie Julian.  Check BMET on Monday Am hold lasix tomorrow  # Hyperlipidemia: LDL 111.  ASCVD 10 year risk is 9%.  Will  wait until [place] before deciding on therapy.   # Hypertension: Metoprolol 12.5 mg bid as above.    Katie [name]

## 2016-01-11 DIAGNOSIS — I5031 Acute diastolic (congestive) heart failure: Secondary | ICD-10-CM | POA: Diagnosis not present

## 2016-01-11 NOTE — Interval H&P Note (Signed)
History and Physical Interval Note:  01/11/2016 5:18 PM Patient has acute heart failure and MR. She will need power injected V Gam to assess severity of MR.Cath Lab Visit (complete for each Cath Lab visit)  Clinical Evaluation Leading to the Procedure:   ACS: No.  Non-ACS:    Anginal Classification: CCS III  Anti-ischemic medical therapy: Maximal Therapy (2 or more classes of medications)  Non-Invasive Test Results: No non-invasive testing performed  Prior CABG: No previous CABG       [name]  has presented today for surgery, with the diagnosis of cm  The various methods of treatment have been discussed with the patient and family. After consideration of risks, benefits and other options for treatment, the patient has consented to  Procedure(s): Right/Left Heart Cath and Coronary/Graft Angiography (N/A) as a surgical intervention .  The patient's history has been reviewed, patient examined, no change in status, stable for surgery.  I have reviewed the patient's chart and labs.  Questions were answered to the patient's satisfaction.     Belva Crome III

## 2016-01-11 NOTE — Progress Notes (Signed)
Patient ID: Katie [name], female   DOB: Jun 07, 1946, 70 y.o.   MRN: [id number]   PATIENT ID: Katie [name] is a 2F with CKD stage III, HTN, and HLD here with acute onset of diastolic heart failure.  SUBJECTIVE: Sitting in chair with no dyspnea .   PHYSICAL EXAM Filed Vitals:   01/10/16 1225 01/10/16 2019 01/11/16 0443 01/11/16 1030  BP: 103/64 117/75 115/75 114/69  Pulse: 71 82 66 94  Temp: 97.8 F (36.6 C) 97.9 F (36.6 C) 97.7 F (36.5 C)   TempSrc: Oral Oral Oral   Resp: 18 18 18    Height:      Weight:   79.606 kg (175 lb 8 oz)   SpO2: 98% 98% 100%    General:  Well-appearing.  No acute distress.  Neck: No JVD at 45 degrees.   Lungs:  CTAB. Heart:  RRR.  No m/r/g.  II/VI holosystolic murmur at the apex Abdomen:  Soft, NT, ND.  +BS Extremities:  WWP.  No edema   LABS: Lab Results  Component Value Date   TROPONINI 0.07* 01/08/2016   No results found for this or any previous visit (from the past 24 hour(s)).  Intake/Output Summary (Last 24 hours) at 01/11/16 1035 Last data filed at 01/11/16 0900  Gross per 24 hour  Intake    620 ml  Output    625 ml  Net     -5 ml    Telemetry: sinus rhythm with occasional PVCs.   Echo 01/08/16: Study Conclusions  - Left ventricle: The cavity size was normal. There was mild  concentric hypertrophy. Systolic function was normal. The  estimated ejection fraction was in the range of 60% to 65%. Wall  motion was normal; there were no regional wall motion  abnormalities. There was a reduced contribution of atrial  contraction to ventricular filling, due to increased ventricular  diastolic pressure or atrial contractile dysfunction. Doppler  parameters are consistent with a reversible restrictive pattern,  indicative of decreased left ventricular diastolic compliance  and/or increased left atrial pressure (grade 3 diastolic  dysfunction). - Aortic valve: Trileaflet; mildly thickened, mildly calcified  leaflets. - Mitral  valve: Trivial, late systolicprolapse, involving the  anterior leaflet. There was moderate regurgitation directed  eccentrically and posteriorly. - Left atrium: The atrium was severely dilated. - Right atrium: The atrium was mildly dilated. - Tricuspid valve: There was mild-moderate regurgitation. - Pulmonic valve: There was trivial regurgitation. - Pulmonary arteries: PA peak pressure: 54 mm Hg (S). - Impressions: The right ventricular systolic pressure was  increased consistent with moderate pulmonary hypertension.  Impressions:  - The right ventricular systolic pressure was increased consistent  with moderate pulmonary hypertension.  Marland Kitchen aspirin EC  81 mg Oral Daily  . heparin subcutaneous  5,000 Units Subcutaneous Q8H  . metoprolol tartrate  12.5 mg Oral BID  . sodium chloride flush  3 mL Intravenous Q12H    ASSESSMENT AND PLAN:  Principal Problem:   Acute CHF (congestive heart failure) (HCC) Active Problems:   Elevated troponin   Mild hypertension   CKD (chronic kidney disease), stage III   Hyperlipidemia   Acute CHF (HCC)   Moderate mitral regurgitation   Moderate tricuspid regurgitation   Moderate to severe pulmonary hypertension ([place])   # Acute diastolic heart failure:  New diagnosis.  Significant MR  Unable to get MRI to r/o restrictive DCM due to low GFR , single kidney and risk of NFS.  Continue diuresis.  Plan per TR is  for right and left heart cath on Monday with Dr Tamala Julian.  Check BMET on Monday Am hold lasix tomorrow Lab Results  Component Value Date   CREATININE 1.58* 01/10/2016   BUN 28* 01/10/2016   NA 137 01/10/2016   K 4.9 01/10/2016   CL 102 01/10/2016   CO2 26 01/10/2016     # Hyperlipidemia: LDL 111.  ASCVD 10 year risk is 9%.  Will wait until [place] before deciding on therapy.   # Hypertension: Metoprolol 12.5 mg bid as above.    Jenkins Rouge

## 2016-01-11 NOTE — H&P (View-Only) (Signed)
Patient ID: Katie [name], female   DOB: 1945-10-14, 70 y.o.   MRN: [id number]   PATIENT ID: Katie [name] is a 48F with CKD stage III, HTN, and HLD here with acute onset of diastolic heart failure.  SUBJECTIVE: Sitting in chair with no dyspnea .   PHYSICAL EXAM Filed Vitals:   01/10/16 1225 01/10/16 2019 01/11/16 0443 01/11/16 1030  BP: 103/64 117/75 115/75 114/69  Pulse: 71 82 66 94  Temp: 97.8 F (36.6 C) 97.9 F (36.6 C) 97.7 F (36.5 C)   TempSrc: Oral Oral Oral   Resp: 18 18 18    Height:      Weight:   79.606 kg (175 lb 8 oz)   SpO2: 98% 98% 100%    General:  Well-appearing.  No acute distress.  Neck: No JVD at 45 degrees.   Lungs:  CTAB. Heart:  RRR.  No m/r/g.  II/VI holosystolic murmur at the apex Abdomen:  Soft, NT, ND.  +BS Extremities:  WWP.  No edema   LABS: Lab Results  Component Value Date   TROPONINI 0.07* 01/08/2016   No results found for this or any previous visit (from the past 24 hour(s)).  Intake/Output Summary (Last 24 hours) at 01/11/16 1035 Last data filed at 01/11/16 0900  Gross per 24 hour  Intake    620 ml  Output    625 ml  Net     -5 ml    Telemetry: sinus rhythm with occasional PVCs.   Echo 01/08/16: Study Conclusions  - Left ventricle: The cavity size was normal. There was mild  concentric hypertrophy. Systolic function was normal. The  estimated ejection fraction was in the range of 60% to 65%. Wall  motion was normal; there were no regional wall motion  abnormalities. There was a reduced contribution of atrial  contraction to ventricular filling, due to increased ventricular  diastolic pressure or atrial contractile dysfunction. Doppler  parameters are consistent with a reversible restrictive pattern,  indicative of decreased left ventricular diastolic compliance  and/or increased left atrial pressure (grade 3 diastolic  dysfunction). - Aortic valve: Trileaflet; mildly thickened, mildly calcified  leaflets. - Mitral  valve: Trivial, late systolicprolapse, involving the  anterior leaflet. There was moderate regurgitation directed  eccentrically and posteriorly. - Left atrium: The atrium was severely dilated. - Right atrium: The atrium was mildly dilated. - Tricuspid valve: There was mild-moderate regurgitation. - Pulmonic valve: There was trivial regurgitation. - Pulmonary arteries: PA peak pressure: 54 mm Hg (S). - Impressions: The right ventricular systolic pressure was  increased consistent with moderate pulmonary hypertension.  Impressions:  - The right ventricular systolic pressure was increased consistent  with moderate pulmonary hypertension.  Marland Kitchen aspirin EC  81 mg Oral Daily  . heparin subcutaneous  5,000 Units Subcutaneous Q8H  . metoprolol tartrate  12.5 mg Oral BID  . sodium chloride flush  3 mL Intravenous Q12H    ASSESSMENT AND PLAN:  Principal Problem:   Acute CHF (congestive heart failure) (HCC) Active Problems:   Elevated troponin   Mild hypertension   CKD (chronic kidney disease), stage III   Hyperlipidemia   Acute CHF (HCC)   Moderate mitral regurgitation   Moderate tricuspid regurgitation   Moderate to severe pulmonary hypertension ([place])   # Acute diastolic heart failure:  New diagnosis.  Significant MR  Unable to get MRI to r/o restrictive DCM due to low GFR , single kidney and risk of NFS.  Continue diuresis.  Plan per TR is  for right and left heart cath on Monday with Dr Tamala Julian.  Check BMET on Monday Am hold lasix tomorrow Lab Results  Component Value Date   CREATININE 1.58* 01/10/2016   BUN 28* 01/10/2016   NA 137 01/10/2016   K 4.9 01/10/2016   CL 102 01/10/2016   CO2 26 01/10/2016     # Hyperlipidemia: LDL 111.  ASCVD 10 year risk is 9%.  Will wait until [place] before deciding on therapy.   # Hypertension: Metoprolol 12.5 mg bid as above.    Jenkins Rouge

## 2016-01-12 ENCOUNTER — Encounter (HOSPITAL_COMMUNITY)
Admission: EM | Disposition: A | Discharge status: Home / Self Care | Payer: Self-pay | Source: Home / Self Care | Attending: Emergency Medicine

## 2016-01-12 ENCOUNTER — Encounter (HOSPITAL_COMMUNITY): Payer: Self-pay | Admitting: Interventional Cardiology

## 2016-01-12 DIAGNOSIS — I340 Nonrheumatic mitral (valve) insufficiency: Secondary | ICD-10-CM | POA: Diagnosis not present

## 2016-01-12 DIAGNOSIS — I5031 Acute diastolic (congestive) heart failure: Secondary | ICD-10-CM | POA: Diagnosis not present

## 2016-01-12 DIAGNOSIS — R079 Chest pain, unspecified: Secondary | ICD-10-CM | POA: Insufficient documentation

## 2016-01-12 HISTORY — PX: CARDIAC CATHETERIZATION: SHX172

## 2016-01-12 LAB — PROTEIN ELECTROPHORESIS, SERUM
A/G Ratio: 1 (ref 0.7–1.7)
Albumin ELP: 3.7 g/dL (ref 2.9–4.4)
Alpha-1-Globulin: 0.2 g/dL (ref 0.0–0.4)
Alpha-2-Globulin: 0.7 g/dL (ref 0.4–1.0)
Beta Globulin: 1.1 g/dL (ref 0.7–1.3)
Gamma Globulin: 1.6 g/dL (ref 0.4–1.8)
Globulin, Total: 3.6 g/dL (ref 2.2–3.9)
Total Protein ELP: 7.3 g/dL (ref 6.0–8.5)

## 2016-01-12 LAB — CBC
HCT: 44.7 % (ref 36.0–46.0)
Hemoglobin: 14.7 g/dL (ref 12.0–15.0)
MCH: 29.3 pg (ref 26.0–34.0)
MCHC: 32.9 g/dL (ref 30.0–36.0)
MCV: 89.2 fL (ref 78.0–100.0)
Platelets: 298 10*3/uL (ref 150–400)
RBC: 5.01 MIL/uL (ref 3.87–5.11)
RDW: 13.5 % (ref 11.5–15.5)
WBC: 3.7 10*3/uL — ABNORMAL LOW (ref 4.0–10.5)

## 2016-01-12 LAB — POCT I-STAT 3, ART BLOOD GAS (G3+)
Acid-Base Excess: 1 mmol/L (ref 0.0–2.0)
Bicarbonate: 26.5 mEq/L — ABNORMAL HIGH (ref 20.0–24.0)
O2 Saturation: 99 %
TCO2: 28 mmol/L (ref 0–100)
pCO2 arterial: 43.8 mmHg (ref 35.0–45.0)
pH, Arterial: 7.39 (ref 7.350–7.450)
pO2, Arterial: 122 mmHg — ABNORMAL HIGH (ref 80.0–100.0)

## 2016-01-12 LAB — POCT I-STAT 3, VENOUS BLOOD GAS (G3P V)
Acid-Base Excess: 1 mmol/L (ref 0.0–2.0)
Bicarbonate: 27.4 mEq/L — ABNORMAL HIGH (ref 20.0–24.0)
O2 Saturation: 72 %
TCO2: 29 mmol/L (ref 0–100)
pCO2, Ven: 48.2 mmHg (ref 45.0–50.0)
pH, Ven: 7.363 — ABNORMAL HIGH (ref 7.250–7.300)
pO2, Ven: 40 mmHg (ref 31.0–45.0)

## 2016-01-12 LAB — BASIC METABOLIC PANEL
Anion gap: 7 (ref 5–15)
BUN: 25 mg/dL — ABNORMAL HIGH (ref 6–20)
CO2: 26 mmol/L (ref 22–32)
Calcium: 9.6 mg/dL (ref 8.9–10.3)
Chloride: 106 mmol/L (ref 101–111)
Creatinine, Ser: 1.4 mg/dL — ABNORMAL HIGH (ref 0.44–1.00)
GFR calc Af Amer: 43 mL/min — ABNORMAL LOW (ref >60)
GFR calc non Af Amer: 37 mL/min — ABNORMAL LOW (ref >60)
Glucose, Bld: 103 mg/dL — ABNORMAL HIGH (ref 65–99)
Potassium: 4.3 mmol/L (ref 3.5–5.1)
Sodium: 139 mmol/L (ref 135–145)

## 2016-01-12 SURGERY — RIGHT/LEFT HEART CATH AND CORONARY/GRAFT ANGIOGRAPHY

## 2016-01-12 MED ORDER — FUROSEMIDE 40 MG PO TABS
40.0000 mg | ORAL_TABLET | Freq: Every day | ORAL | Status: DC
  Administered 2016-01-12 – 2016-01-13 (×2): 40 mg via ORAL
  Filled 2016-01-12 (×2): qty 1

## 2016-01-12 MED ORDER — SODIUM CHLORIDE 0.9 % WEIGHT BASED INFUSION: 1.0000 mL/kg/h | INTRAVENOUS | Status: AC

## 2016-01-12 MED ORDER — SODIUM CHLORIDE 0.9% FLUSH: 3.0000 mL | INTRAVENOUS | Status: DC | PRN

## 2016-01-12 MED ORDER — OXYCODONE-ACETAMINOPHEN 5-325 MG PO TABS: 1.0000 | ORAL_TABLET | ORAL | Status: DC | PRN

## 2016-01-12 MED ORDER — SODIUM CHLORIDE 0.9 % IV SOLN: INTRAVENOUS | Status: DC

## 2016-01-12 MED ORDER — SODIUM CHLORIDE 0.9 % IV SOLN
INTRAVENOUS | Status: DC
  Administered 2016-01-12: 05:00:00 via INTRAVENOUS

## 2016-01-12 MED ORDER — HEPARIN (PORCINE) IN NACL 2-0.9 UNIT/ML-% IJ SOLN
INTRAMUSCULAR | Status: AC
  Filled 2016-01-12: qty 1500

## 2016-01-12 MED ORDER — SODIUM CHLORIDE 0.9 % IV SOLN: 250.0000 mL | INTRAVENOUS | Status: DC | PRN

## 2016-01-12 MED ORDER — POTASSIUM CHLORIDE CRYS ER 10 MEQ PO TBCR
10.0000 meq | EXTENDED_RELEASE_TABLET | Freq: Every day | ORAL | Status: DC
  Administered 2016-01-12 – 2016-01-13 (×2): 10 meq via ORAL
  Filled 2016-01-12 (×2): qty 1

## 2016-01-12 MED ORDER — IOPAMIDOL (ISOVUE-370) INJECTION 76%
INTRAVENOUS | Status: DC | PRN
  Administered 2016-01-12: 85 mL via INTRAVENOUS

## 2016-01-12 MED ORDER — LIDOCAINE HCL (PF) 1 % IJ SOLN
INTRAMUSCULAR | Status: AC
  Filled 2016-01-12: qty 30

## 2016-01-12 MED ORDER — SODIUM CHLORIDE 0.9% FLUSH
3.0000 mL | Freq: Two times a day (BID) | INTRAVENOUS | Status: DC
  Administered 2016-01-12: 3 mL via INTRAVENOUS

## 2016-01-12 MED ORDER — ASPIRIN 81 MG PO CHEW
81.0000 mg | CHEWABLE_TABLET | ORAL | Status: AC
  Administered 2016-01-12: 81 mg via ORAL

## 2016-01-12 MED ORDER — FENTANYL CITRATE (PF) 100 MCG/2ML IJ SOLN
INTRAMUSCULAR | Status: AC
  Filled 2016-01-12: qty 2

## 2016-01-12 MED ORDER — MIDAZOLAM HCL 2 MG/2ML IJ SOLN
INTRAMUSCULAR | Status: DC | PRN
  Administered 2016-01-12 (×2): 1 mg via INTRAVENOUS

## 2016-01-12 MED ORDER — HEPARIN SODIUM (PORCINE) 5000 UNIT/ML IJ SOLN
5000.0000 [IU] | Freq: Three times a day (TID) | INTRAMUSCULAR | Status: DC
  Administered 2016-01-12 – 2016-01-13 (×3): 5000 [IU] via SUBCUTANEOUS
  Filled 2016-01-12 (×3): qty 1

## 2016-01-12 MED ORDER — ASPIRIN 81 MG PO CHEW
81.0000 mg | CHEWABLE_TABLET | Freq: Every day | ORAL | Status: DC
  Administered 2016-01-13: 81 mg via ORAL
  Filled 2016-01-12: qty 1

## 2016-01-12 MED ORDER — HEPARIN (PORCINE) IN NACL 2-0.9 UNIT/ML-% IJ SOLN
INTRAMUSCULAR | Status: DC | PRN
  Administered 2016-01-12: 1500 mL

## 2016-01-12 MED ORDER — MIDAZOLAM HCL 2 MG/2ML IJ SOLN
INTRAMUSCULAR | Status: AC
  Filled 2016-01-12: qty 2

## 2016-01-12 MED ORDER — ONDANSETRON HCL 4 MG/2ML IJ SOLN: 4.0000 mg | Freq: Four times a day (QID) | INTRAMUSCULAR | Status: DC | PRN

## 2016-01-12 MED ORDER — LIDOCAINE HCL (PF) 1 % IJ SOLN
INTRAMUSCULAR | Status: DC | PRN
  Administered 2016-01-12: 20 mL

## 2016-01-12 MED ORDER — SODIUM CHLORIDE 0.9% FLUSH
3.0000 mL | Freq: Two times a day (BID) | INTRAVENOUS | Status: DC
  Administered 2016-01-12 – 2016-01-13 (×3): 3 mL via INTRAVENOUS

## 2016-01-12 MED ORDER — ACETAMINOPHEN 325 MG PO TABS: 650.0000 mg | ORAL_TABLET | ORAL | Status: DC | PRN

## 2016-01-12 MED ORDER — FENTANYL CITRATE (PF) 100 MCG/2ML IJ SOLN
INTRAMUSCULAR | Status: DC | PRN
  Administered 2016-01-12 (×2): 50 ug via INTRAVENOUS

## 2016-01-12 SURGICAL SUPPLY — 13 items
CATH INFINITI 5FR ANG PIGTAIL (CATHETERS) ×2 IMPLANT
CATH INFINITI JR4 5F (CATHETERS) ×2 IMPLANT
CATH SITESEER 5F MULTI A 2 (CATHETERS) ×2 IMPLANT
CATH SWAN GANZ 7F STRAIGHT (CATHETERS) ×2 IMPLANT
KIT HEART LEFT (KITS) ×2 IMPLANT
KIT HEART RIGHT NAMIC (KITS) ×2 IMPLANT
PACK CARDIAC CATHETERIZATION (CUSTOM PROCEDURE TRAY) ×2 IMPLANT
SHEATH PINNACLE 5F 10CM (SHEATH) ×2 IMPLANT
SHEATH PINNACLE 7F 10CM (SHEATH) ×2 IMPLANT
SYR MEDRAD MARK V 150ML (SYRINGE) ×2 IMPLANT
TRANSDUCER W/STOPCOCK (MISCELLANEOUS) ×2 IMPLANT
TUBING CIL FLEX 10 FLL-RA (TUBING) ×2 IMPLANT
WIRE EMERALD 3MM-J .035X150CM (WIRE) ×2 IMPLANT

## 2016-01-12 NOTE — Progress Notes (Signed)
Site area:  RFA/RFV Site Prior to Removal:  Level 0 Pressure Applied For:55min Manual:  yes  Patient Status During Pull:  stable Post Pull Site:  Level 0 Post Pull Instructions Given:  yes Post Pull Pulses Present: palpable Dressing Applied:  tegaderm Bedrest begins @ 09100 till 1310 Comments:

## 2016-01-12 NOTE — Interval H&P Note (Signed)
History and Physical Interval Note:  01/12/2016 7:30 AM  Katie [name]  has presented today for surgery, with the diagnosis of cm  The various methods of treatment have been discussed with the patient and family. After consideration of risks, benefits and other options for treatment, the patient has consented to  Procedure(s): Right/Left Heart Cath and Coronary/Graft Angiography (N/A) as a surgical intervention .  The patient's history has been reviewed, patient examined, no change in status, stable for surgery.  I have reviewed the patient's chart and labs.  Questions were answered to the patient's satisfaction.     Belva Crome III

## 2016-01-12 NOTE — Progress Notes (Addendum)
Patient Name: Katie [name] Date of Encounter: 01/12/2016  Principal Problem:   Acute CHF (congestive heart failure) ([place]) Active Problems:   Elevated troponin   Mild hypertension   CKD (chronic kidney disease), stage III   Hyperlipidemia   Acute CHF (HCC)   Moderate mitral regurgitation   Moderate tricuspid regurgitation   Moderate to severe pulmonary hypertension ([place])   Chest pain   Primary Cardiologist: New/Dr. Oval Linsey Patient Profile: Katie [name] is a 70 year old female with a past medical history of renal CA, CKD stage III, HTN and HLD. She presented to the ED on 01/07/16 with progressive SOB with exertion, orthopnea, and dry cough. She also reported chest pain that lasted only a few seconds that was associated with exertion as well. D -dimer elevated, VQ scan showed low probability of PE. CXR showed increased interstitial opacity and new effusions.   SUBJECTIVE: Feels well, denies chest pain, SOB. Just returned from cath lab.    OBJECTIVE Filed Vitals:   01/12/16 0900 01/12/16 0905 01/12/16 0906 01/12/16 0910  BP: 110/77 116/72 110/76   Pulse: 70 73 68 68  Temp:      TempSrc:      Resp: 14 13 15 15   Height:      Weight:      SpO2: 97% 97% 98% 98%    Intake/Output Summary (Last 24 hours) at 01/12/16 0940 Last data filed at 01/12/16 0600  Gross per 24 hour  Intake    940 ml  Output   1600 ml  Net   -660 ml   Filed Weights   01/10/16 0543 01/11/16 0443 01/12/16 0552  Weight: 174 lb 6.4 oz (79.107 kg) 175 lb 8 oz (79.606 kg) 176 lb (79.833 kg)    PHYSICAL EXAM General: Well developed, well nourished, female in no acute distress. Head: Normocephalic, atraumatic.  Neck: Supple without bruits,no JVD. Lungs:  Resp regular and unlabored, CTA. Heart: RRR, S1, S2, no S3, S4, or murmur; no rub. Abdomen: Soft, non-tender, non-distended, BS + x 4.  Extremities: No clubbing, cyanosis, No edema.  Neuro: Alert and oriented X 3. Moves all extremities  spontaneously. Psych: Normal affect.  LABS: Basic Metabolic Panel: Recent Labs  01/10/16 0314 01/12/16 0504  NA 137 139  K 4.9 4.3  CL 102 106  CO2 26 26  GLUCOSE 90 103*  BUN 28* 25*  CREATININE 1.58* 1.40*  CALCIUM 9.5 9.6    BNP:  B NATRIURETIC PEPTIDE  Date/Time Value Ref Range Status  01/07/2016 11:43 AM 425.2* 0.0 - 100.0 pg/mL Final     Current facility-administered medications:  .  0.9 %  sodium chloride infusion, 250 mL, Intravenous, PRN, Dayna N Dunn, PA-C .  0.9 %  sodium chloride infusion, 250 mL, Intravenous, PRN, Belva Crome, MD .  0.9% sodium chloride infusion, 1 mL/kg/hr, Intravenous, Continuous, Belva Crome, MD, Last Rate: 79.8 mL/hr at 01/12/16 0841, 1 mL/kg/hr at 01/12/16 0841 .  acetaminophen (TYLENOL) tablet 650 mg, 650 mg, Oral, Q4H PRN, Dayna N Dunn, PA-C, 650 mg at 01/11/16 [id number] .  acetaminophen (TYLENOL) tablet 650 mg, 650 mg, Oral, Q4H PRN, Belva Crome, MD .  aspirin chewable tablet 81 mg, 81 mg, Oral, Daily, Belva Crome, MD .  aspirin EC tablet 81 mg, 81 mg, Oral, Daily, Dayna N Dunn, PA-C, 81 mg at 01/11/16 1031 .  heparin injection 5,000 Units, 5,000 Units, Subcutaneous, Q8H, Belva Crome, MD .  metoprolol tartrate (LOPRESSOR) tablet  12.5 mg, 12.5 mg, Oral, BID, Skeet Latch, MD, 12.5 mg at 01/11/16 2200 .  ondansetron (ZOFRAN) injection 4 mg, 4 mg, Intravenous, Q6H PRN, Dayna N Dunn, PA-C .  ondansetron (ZOFRAN) injection 4 mg, 4 mg, Intravenous, Q6H PRN, Belva Crome, MD .  oxyCODONE-acetaminophen (PERCOCET/ROXICET) 5-325 MG per tablet 1-2 tablet, 1-2 tablet, Oral, Q4H PRN, Belva Crome, MD .  sodium chloride flush (NS) 0.9 % injection 3 mL, 3 mL, Intravenous, Q12H, Belva Crome, MD .  sodium chloride flush (NS) 0.9 % injection 3 mL, 3 mL, Intravenous, PRN, Belva Crome, MD . sodium chloride 1 mL/kg/hr (01/12/16 0841)    TELE: NSR       ECG: NSR  Right/Left Heart Cath and Coronary/Graft Angiography 01/12/16  Widely  patent/normal coronary arteries.  Mildly to moderately reduced left ventricular systolic function with EF 35-40%. Filling pressures are normal. No mitral regurgitation of significance is noted.  Mildly elevated pulmonary artery pressures with mean PA pressure of 23 mmHg.   RECOMMENDATIONS:   Heart failure therapy to be continued by the primary team.  Overall prognosis for improvement is optimistic.  Current Medications:  . aspirin  81 mg Oral Daily  . aspirin EC  81 mg Oral Daily  . heparin  5,000 Units Subcutaneous Q8H  . metoprolol tartrate  12.5 mg Oral BID  . sodium chloride flush  3 mL Intravenous Q12H   . sodium chloride 1 mL/kg/hr (01/12/16 0841)    ASSESSMENT AND PLAN: Principal Problem:   Acute CHF (congestive heart failure) (HCC) Active Problems:   Elevated troponin   Mild hypertension   CKD (chronic kidney disease), stage III   Hyperlipidemia   Acute CHF (HCC)   Moderate mitral regurgitation   Moderate tricuspid regurgitation   Moderate to severe pulmonary hypertension (HCC)   Chest pain  1. Acute on chronic diastolic CHF: Patient presented with DOE and dry cough with intermittent chest pain. Echo revealed normal EF of 123456, grade 3 diastolic dysfunction with a reversible restrictive pattern indicative of decreased LV diastolic compliance. 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  She had left and right heart cath today. Her coronaries are widely patent. EF noted to be 35-40% by cath. LVEDP 12, PW mean is 23. Her IV diuresis was on hold today for cath, GFR is 43, creatinine is 1.4 which is her baseline. She has not gotten IV Lasix for the past 4 days. Weight is up one pound from yesterday, 2 pounds from 2 days ago. It is hard to assess her volume status. She does not have any lower extremity edema, no JVD. MD to advise on IV Lasix, at this point she might be able to maintain  on oral furosemide.   She is on metoprolol. No ACE in setting of CKD.  2. HLD: LDL is 111 this admission. Widely patent coronary arteries.  3. HTN: BP well controlled on metoprolol.    Signed, Arbutus Leas , NP 9:40 AM 01/12/2016 Pager [phone]. .  Attending Note:   The patient was seen and examined.  Agree with assessment and plan as noted above.  Changes made to the above note as needed.  Patient seen and independently examined with Jettie Booze, NP.   We discussed all aspects of the encounter. I agree with the assessment and plan as stated above.  i have personally reviwed the cath .   She  has some faint collaterals from the RCA to the distal LAD ( up through the septum)   I dont see any occluded branches.   Perhaps the vessel is intra muscular .  She has mild elevation of PA pressures.   May benefit from PO lasix.   Will start Lasix 40 and Kdur 10 a day .   I dont think she needs to be aggressively diuresed.   No revascularization needed.    Has small vessel disease     I have spent a total of 30  minutes with patient reviewing hospital  notes , telemetry, EKGs, labs and examining patient as well as establishing an assessment and plan that was discussed with the patient. > 50% of time was spent in direct patient care.  Anticipate DC tomorrow   Thayer Headings, Brooke Bonito., MD, [facility] 01/12/2016, 12:16 PM [address]  [place] Pager [phone]

## 2016-01-13 ENCOUNTER — Telehealth: Payer: Self-pay | Admitting: Physician Assistant

## 2016-01-13 DIAGNOSIS — I5031 Acute diastolic (congestive) heart failure: Secondary | ICD-10-CM | POA: Diagnosis not present

## 2016-01-13 MED ORDER — POTASSIUM CHLORIDE ER 10 MEQ PO CPCR: 20.0000 meq | ORAL_CAPSULE | Freq: Every day | ORAL | Status: DC

## 2016-01-13 MED ORDER — METOPROLOL TARTRATE 25 MG PO TABS: 12.5000 mg | ORAL_TABLET | Freq: Two times a day (BID) | ORAL | Status: DC

## 2016-01-13 MED ORDER — FUROSEMIDE 40 MG PO TABS: 40.0000 mg | ORAL_TABLET | Freq: Every day | ORAL | Status: DC

## 2016-01-13 NOTE — Telephone Encounter (Signed)
Patient discharged 01/13/16 - first TCM attempt should be made 01/14/16

## 2016-01-13 NOTE — Telephone Encounter (Signed)
New message      TOC appt made with Rosaria Ferries on 01-23-16, per Jettie Booze.

## 2016-01-13 NOTE — Discharge Summary (Signed)
Discharge Summary    Patient ID: Katie [name],  MRN: [id number], DOB/AGE: 1945-12-05 70 y.o.  Admit date: 01/07/2016 Discharge date: 01/13/2016  Primary Care Provider: Seward Carol D Primary Cardiologist: Dr. Oval Linsey  Discharge Diagnoses    Principal Problem:   Acute CHF (congestive heart failure) ([place]) Active Problems:   Elevated troponin   Mild hypertension   CKD (chronic kidney disease), stage III   Hyperlipidemia   Acute CHF (HCC)   Moderate mitral regurgitation   Moderate tricuspid regurgitation   Moderate to severe pulmonary hypertension (HCC)   Chest pain   Allergies Allergies  Allergen Reactions  . Tramadol Shortness Of Breath, Palpitations and Other (See Comments)    Patient thinks that her recent symptoms of breathlessness may be related to taking this (??)    Diagnostic Studies/Procedures  Right/Left Heart Cath and Coronary/Graft Angiography 01/12/16  Widely patent/normal coronary arteries.  Mildly to moderately reduced left ventricular systolic function with EF 35-40%. Filling pressures are normal. No mitral regurgitation of significance is noted.  Mildly elevated pulmonary artery pressures with mean PA pressure of 23 mmHg.   RECOMMENDATIONS:   Heart failure therapy to be continued by the primary team.  Overall prognosis for improvement is optimistic. _____________   History of Present Illness   Ms. Garnto is a 70 year old female with history of renal cancer with right nephrectomy, CKD stage III, HTN, and HLD.  She presented to the ED with reports of  progressive SOB, particularly with exertion. It improves when she stops and rests. She also noticed left upper chest pain that lasts one to two seconds at a time, on and off for several days, without any associated provocative measure except sometimes coming on with palpation. It is not worse with exertion. She also reports orthopnea, dry cough and wheezing. No nausea, vomiting, change in appetite ("I  like to eat"), syncope, or bleeding.  She also reported chronic RLE pedal edema that waxes and wanes, not present today. She hasn't noticed any change in weight.  Labs show troponin 0.05-0.06, BNP 452, Cr 1.37 c/w prior. D-dimer 1.96 - subsequent VQ with matching defect RML; low probability of PE. CXR chronic cardiomegaly with increased interstitial opacity and new effusions, favor edema with consideration of viral/atypical infection.  She was admitted for IV diuresis and further evaluation.   Hospital Course  Ms. Nest echo showed normal LVEF of 60-65%, no regional wall motion abnormalities.Doppler parameters were consistent with a reversible restrictive pattern,indicative of decreased left ventricular diastolic compliance and/or increased left atrial pressure (grade 3 diastolic dysfunction). There was some concern of restrictive cardiomyopathy, and an MRI was ordered to further evaluate. However, with CKD, it was felt that it was better to assess with right and left heart cath as renal function was worsening.   IV diuresis was held in anticipation for cath to help preserve renal function. Her cath was preformed on 01/12/16. She had widely patent coronary arteries, her LV EDP was 12, PA mean was 29mmHg. She was started on po Lasix. Patient was counseled about sodium restrictions and fluid restrictions. She was started on metoprolol and will go home on this in addition to po Lasix and potassium supplementation.   Her dyspnea significantly improved after diuresis. Her discharge weight is 175 pounds. Creatinine is down to 1.4. She was seen today by Dr. Acie Fredrickson and deemed suitable for discharge.    _____________  Discharge Vitals Blood pressure 122/72, pulse 65, temperature 97.8 F (36.6 C), temperature source Oral,  resp. rate 16, height 5\' 4"  (1.626 m), weight 175 lb 1.6 oz (79.425 kg), last menstrual period 02/12/2013, SpO2 98 %.  Filed Weights   01/11/16 0443 01/12/16 0552 01/13/16 0552  Weight:  175 lb 8 oz (79.606 kg) 176 lb (79.833 kg) 175 lb 1.6 oz (79.425 kg)    Labs & Radiologic Studies     CBC  Recent Labs  01/12/16 1045  WBC 3.7*  HGB 14.7  HCT 44.7  MCV 89.2  PLT Q000111Q   Basic Metabolic Panel  Recent Labs  01/12/16 0504  NA 139  K 4.3  CL 106  CO2 26  GLUCOSE 103*  BUN 25*  CREATININE 1.40*  CALCIUM 9.6    Dg Chest 2 View  01/07/2016  CLINICAL DATA:  70 year old female with shortness of breath chest pain and wheezing for 3 days. Initial encounter. EXAM: CHEST  2 VIEW COMPARISON:  10/17/2015 and earlier. FINDINGS: Stable cardiomegaly and mediastinal contours. Small bilateral pleural effusions are new. Mildly increased interstitial markings in both lungs. No pneumothorax or other acute pulmonary opacity. No acute osseous abnormality identified. Stable cholecystectomy clips. IMPRESSION: Chronic cardiomegaly with increased interstitial opacity and new small pleural effusions. Favor acute interstitial edema with main differential consideration of viral/ atypical respiratory infection. Electronically Signed   By: Genevie Ann M.D.   On: 01/07/2016 10:35   Nm Pulmonary Perf And Vent  01/07/2016  CLINICAL DATA:  Shortness of breath and chest pain for 2 days, history hypertension, chronic kidney disease EXAM: NUCLEAR MEDICINE VENTILATION - PERFUSION LUNG SCAN TECHNIQUE: Ventilation images were obtained in multiple projections using inhaled aerosol Tc-48m DTPA. Perfusion images were obtained in multiple projections after intravenous injection of Tc-29m MAA. RADIOPHARMACEUTICALS:  31.6 mCi Technetium-76m DTPA aerosol inhalation and 4.1 mCi Technetium-31m MAA IV COMPARISON:  None Correlation:  Chest radiograph 01/07/2016 FINDINGS: Ventilation: Aerosol deposition within trachea. Swallowed aerosol within stomach. Subsegmental ventilation defect laterally RIGHT mid lung likely RIGHT middle lobe. Enlargement of cardiac silhouette. No additional segmental or subsegmental perfusion defects.  Perfusion: Matching subsegmental diminished perfusion lateral RIGHT middle lobe. No additional significant perfusion abnormalities. Chest radiograph: Enlargement of cardiac silhouette with pulmonary vascular congestion and question mild pulmonary edema. IMPRESSION: Matching subsegmental diminished ventilation and perfusion in lateral RIGHT middle lobe. Low probability of pulmonary embolism. Electronically Signed   By: Lavonia Dana M.D.   On: 01/07/2016 13:54    Disposition   Pt is being discharged home today in good condition.  Follow-up Plans & Appointments    Follow-up Information    Follow up with Rosaria Ferries, PA-C On 01/23/2016.   Specialties:  Cardiology, Radiology   Why:  at 10:00 am for follow up.    Contact information:   [address] [place] [address] [phone]      Discharge Instructions    Diet - low sodium heart healthy    Complete by:  As directed      Increase activity slowly    Complete by:  As directed            Discharge Medications   Current Discharge Medication List    START taking these medications   Details  furosemide (LASIX) 40 MG tablet Take 1 tablet (40 mg total) by mouth daily. Qty: 30 tablet, Refills: 12    metoprolol tartrate (LOPRESSOR) 25 MG tablet Take 0.5 tablets (12.5 mg total) by mouth 2 (two) times daily. Qty: 30 tablet, Refills: 12    potassium chloride (MICRO-K) 10 MEQ CR capsule  Take 2 capsules (20 mEq total) by mouth daily. Qty: 30 capsule, Refills: 12      CONTINUE these medications which have NOT CHANGED   Details  aspirin EC 81 MG tablet Take 81 mg by mouth daily.    pravastatin (PRAVACHOL) 80 MG tablet Take 80 mg by mouth daily.           Outstanding Labs/Studies   BMP.   Duration of Discharge Encounter   Greater than 30 minutes including physician time.  Signed, Arbutus Leas NP 01/13/2016, 11:46 AM   Attending Note:   The patient was seen and examined.  Agree with assessment and  plan as noted above.  Changes made to the above note as needed.  Pt is stable for Dc. See progress note from same day    Ramond Dial., MD, [facility] 01/14/2016, 4:39 PM [address],  [place] Pager [phone]

## 2016-01-13 NOTE — Progress Notes (Signed)
Patient Name: Katie [name] Date of Encounter: 01/13/2016  Principal Problem:   Acute CHF (congestive heart failure) ([place]) Active Problems:   Elevated troponin   Mild hypertension   CKD (chronic kidney disease), stage III   Hyperlipidemia   Acute CHF (HCC)   Moderate mitral regurgitation   Moderate tricuspid regurgitation   Moderate to severe pulmonary hypertension ([place])   Chest pain   Primary Cardiologist: Dr. Oval Linsey  Patient Profile: Katie [name] is a 70 year old female with a past medical history of renal CA, CKD stage III, HTN and HLD. She presented to the ED on 01/07/16 with progressive SOB with exertion, orthopnea, and dry cough. She also reported chest pain that lasted only a few seconds that was associated with exertion as well. D -dimer elevated, VQ scan showed low probability of PE. CXR showed increased interstitial opacity and new effusions.  Cath showed normal cors, and elevated pulmonary artery pressures of 23 mmHg.   SUBJECTIVE: Feels well, denies SOB. Wants to go home.    OBJECTIVE Filed Vitals:   01/12/16 1415 01/12/16 2025 01/13/16 0006 01/13/16 0552  BP: 117/72 105/73 106/63 122/72  Pulse: 78 73 67 65  Temp:  97.7 F (36.5 C) 97.9 F (36.6 C) 97.8 F (36.6 C)  TempSrc:  Oral Oral Oral  Resp: 18 18 18 16   Height:      Weight:    175 lb 1.6 oz (79.425 kg)  SpO2:  98% 98% 98%    Intake/Output Summary (Last 24 hours) at 01/13/16 1041 Last data filed at 01/13/16 0846  Gross per 24 hour  Intake   1350 ml  Output   1925 ml  Net   -575 ml   Filed Weights   01/11/16 0443 01/12/16 0552 01/13/16 0552  Weight: 175 lb 8 oz (79.606 kg) 176 lb (79.833 kg) 175 lb 1.6 oz (79.425 kg)    PHYSICAL EXAM General: Well developed, well nourished, female in no acute distress. Head: Normocephalic, atraumatic.  Neck: Supple without bruits, No JVD. Lungs:  Resp regular and unlabored, CTA. Heart: RRR, S1, S2, no S3, S4, or murmur; no rub. Abdomen: Soft, non-tender,  non-distended, BS + x 4.  Extremities: No clubbing, cyanosis, No edema.  Neuro: Alert and oriented X 3. Moves all extremities spontaneously. Psych: Normal affect.  LABS: CBC: Recent Labs  01/12/16 1045  WBC 3.7*  HGB 14.7  HCT 44.7  MCV 89.2  PLT Q000111Q   Basic Metabolic Panel: Recent Labs  01/12/16 0504  NA 139  K 4.3  CL 106  CO2 26  GLUCOSE 103*  BUN 25*  CREATININE 1.40*  CALCIUM 9.6   BNP:  B NATRIURETIC PEPTIDE  Date/Time Value Ref Range Status  01/07/2016 11:43 AM 425.2* 0.0 - 100.0 pg/mL Final     Current facility-administered medications:  .  0.9 %  sodium chloride infusion, 250 mL, Intravenous, PRN, Dayna N Dunn, PA-C .  0.9 %  sodium chloride infusion, 250 mL, Intravenous, PRN, Belva Crome, MD .  acetaminophen (TYLENOL) tablet 650 mg, 650 mg, Oral, Q4H PRN, Belva Crome, MD .  aspirin chewable tablet 81 mg, 81 mg, Oral, Daily, Belva Crome, MD, 81 mg at 01/13/16 0956 .  furosemide (LASIX) tablet 40 mg, 40 mg, Oral, Daily, Thayer Headings, MD, 40 mg at 01/13/16 0956 .  heparin injection 5,000 Units, 5,000 Units, Subcutaneous, Q8H, Belva Crome, MD, 5,000 Units at 01/13/16 [phone] .  metoprolol tartrate (LOPRESSOR) tablet  12.5 mg, 12.5 mg, Oral, BID, Skeet Latch, MD, 12.5 mg at 01/13/16 0956 .  ondansetron (ZOFRAN) injection 4 mg, 4 mg, Intravenous, Q6H PRN, Belva Crome, MD .  oxyCODONE-acetaminophen (PERCOCET/ROXICET) 5-325 MG per tablet 1-2 tablet, 1-2 tablet, Oral, Q4H PRN, Belva Crome, MD .  potassium chloride (K-DUR,KLOR-CON) CR tablet 10 mEq, 10 mEq, Oral, Daily, Thayer Headings, MD, 10 mEq at 01/13/16 0956 .  sodium chloride flush (NS) 0.9 % injection 3 mL, 3 mL, Intravenous, Q12H, Belva Crome, MD, 3 mL at 01/13/16 1000 .  sodium chloride flush (NS) 0.9 % injection 3 mL, 3 mL, Intravenous, PRN, Belva Crome, MD     TELE: NSR        ECG: NSR  Right/Left Heart Cath and Coronary/Graft Angiography  Widely patent/normal coronary  arteries.  Mildly to moderately reduced left ventricular systolic function with EF 35-40%. Filling pressures are normal. No mitral regurgitation of significance is noted.  Mildly elevated pulmonary artery pressures with mean PA pressure of 23 mmHg.   RECOMMENDATIONS:   Heart failure therapy to be continued by the primary team.  Overall prognosis for improvement is optimistic.       Current Medications:  . aspirin  81 mg Oral Daily  . furosemide  40 mg Oral Daily  . heparin  5,000 Units Subcutaneous Q8H  . metoprolol tartrate  12.5 mg Oral BID  . potassium chloride  10 mEq Oral Daily  . sodium chloride flush  3 mL Intravenous Q12H      ASSESSMENT AND PLAN: Principal Problem:   Acute CHF (congestive heart failure) (HCC) Active Problems:   Elevated troponin   Mild hypertension   CKD (chronic kidney disease), stage III   Hyperlipidemia   Acute CHF (HCC)   Moderate mitral regurgitation   Moderate tricuspid regurgitation   Moderate to severe pulmonary hypertension (HCC)   Chest pain  1. Acute on chronic diastolic CHF: Patient presented with DOE and dry cough with intermittent chest pain. Echo revealed normal EF of 123456, grade 3 diastolic dysfunction with a reversible restrictive pattern indicative of decreased LV diastolic compliance. 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  She had left and right heart cath yesterday. Her coronaries are widely patent. EF noted to be 35-40% by cath. LVEDP 12, PW mean is 23. Will need to go home on po lasix. She responded well to oral furosemide yesterday, with 1325 of urine out. She is net negative 5.5 L for admission. I suspect that she is close to being euvolemic. Weight is down to 175lbs, she was 181 lbs on admission.   She is on metoprolol. No ACE in setting of CKD.  2. HLD: LDL is 111 this admission. Widely patent coronary arteries.  3. HTN:  BP well controlled on metoprolol.    Signed, Arbutus Leas , NP 10:41 AM 01/13/2016 Pager [phone]  Attending Note:   The patient was seen and examined.  Agree with assessment and plan as noted above.  Changes made to the above note as needed.  Patient seen and independently examined with Jettie Booze, NP.   We discussed all aspects of the encounter. I agree with the assessment and plan as stated above.  Pt is stable for DC   I have spent a total of 35 minutes with patient reviewing hospital  notes , telemetry, EKGs, labs and examining patient as well as  establishing an assessment and plan that was discussed with the patient. > 50% of time was spent in direct patient care.    Thayer Headings, Brooke Bonito., MD, [facility] 01/14/2016, 4:38 PM [address],  [place] Pager [phone]

## 2016-01-14 NOTE — Telephone Encounter (Signed)
Patient contacted regarding discharge from [facility] on 01/13/16.  Patient understands to follow up with provider Rosaria Ferries, PA-C on 01/23/16 at 10am at [facility].    Patient understands discharge instructions? yes  Patient understands medications and regiment? yes  Patient understands to bring all medications to this visit? Yes

## 2016-01-20 ENCOUNTER — Other Ambulatory Visit: Payer: Self-pay

## 2016-01-20 NOTE — Patient Outreach (Signed)
[name]) Care Management  01/20/2016  Katie BEMBRY Apr 29, 1946 [id number]     EMMI-Stroke RED ON EMMI ALERT Day # 3 Date: 01/18/16 Red Alert Reason: "weighed themselves today? No"   Outreach attempt #1 to patient. Spoke with patient. She states she is doing well. Addressed red alert. She reports she is montiroing weight daily. However, when she answered the automated call that morning she had not had a chance to weigh. patietn reports weight stable at 169lbs. Denies any edema or SOB. She got all her meds. Patient had concersn about being able to "half" Lopressor pill as directed to take half a tab. She vocies she has been using a knife to do it. Advised patient to not do this. Encouraged her to obtain pill splitter ad/or ask pharmacy to assist with splitting tabs for her for safety and accurate dosaging meaures. She voiced understanding and will do so. She has f/u appt with cardiologist on next week. She does not have an appt with PCP-states she was not advised to f/u with him. Discussed need for PCP f/u appt post discharge. Patient voiced understanding and will make PCP appt ASAP. No further RN CM needs or concerns at this time.     Plan: RN CM will notify [facility] administrative assistant of case closure.   Enzo Montgomery, RN,BSN,CCM [facility] Telephonic Care Management Coordinator Direct Phone: [phone] Toll Free: [phone] Fax: [phone]

## 2016-01-23 ENCOUNTER — Ambulatory Visit: Payer: PPO | Admitting: Physician Assistant

## 2016-01-23 DIAGNOSIS — I509 Heart failure, unspecified: Secondary | ICD-10-CM | POA: Diagnosis not present

## 2016-01-27 ENCOUNTER — Other Ambulatory Visit: Payer: Self-pay

## 2016-01-27 DIAGNOSIS — H40023 Open angle with borderline findings, high risk, bilateral: Secondary | ICD-10-CM | POA: Diagnosis not present

## 2016-01-27 NOTE — Patient Outreach (Signed)
[facility]) Care Management  01/27/2016  Katie [name] 22-Dec-1945 [id number]     EMMI-HF RED ON EMMI ALERT Day #9 Date: 01/24/16 Red Alert Reason: "new/worsening problems? Yes   Filled new prescriptions? No"   Outreach attempt #1 to patient. Spoke with patient. Addressed red alert. She states she did not mean to answer questions that way and machine must have misunderstood her. She denies any new or worsening symptoms. She voices that she picked up some of meds today and will go back to store later this week to get rest of her refills. No other RN CM needs at this time.     Plan: RN CM will notify [facility] administrative assistant of case closure status.  Enzo Montgomery, RN,BSN,CCM [place] Management Telephonic Care Management Coordinator Direct Phone: [phone] Toll Free: [phone] Fax: [phone]

## 2016-01-30 ENCOUNTER — Encounter: Payer: Self-pay | Admitting: Cardiology

## 2016-01-30 ENCOUNTER — Ambulatory Visit (INDEPENDENT_AMBULATORY_CARE_PROVIDER_SITE_OTHER): Payer: PPO | Admitting: Cardiology

## 2016-01-30 VITALS — BP 115/68 | HR 67 | Ht 64.0 in | Wt 174.8 lb

## 2016-01-30 DIAGNOSIS — N183 Chronic kidney disease, stage 3 unspecified: Secondary | ICD-10-CM

## 2016-01-30 DIAGNOSIS — I10 Essential (primary) hypertension: Secondary | ICD-10-CM

## 2016-01-30 LAB — BASIC METABOLIC PANEL
BUN: 23 mg/dL (ref 7–25)
CO2: 27 mmol/L (ref 20–31)
Calcium: 10 mg/dL (ref 8.6–10.4)
Chloride: 102 mmol/L (ref 98–110)
Creat: 1.63 mg/dL — ABNORMAL HIGH (ref 0.60–0.93)
Glucose, Bld: 85 mg/dL (ref 65–99)
Potassium: 4.3 mmol/L (ref 3.5–5.3)
Sodium: 141 mmol/L (ref 135–146)

## 2016-01-30 NOTE — Progress Notes (Signed)
01/30/2016 Katie [name]   April 16, 1946  [id number]  Primary Physician Kandice Hams, MD Primary Cardiologist: Dr. Acie Fredrickson   Reason for Visit/CC: [facility] f/u for Diastolic CHF  HPI:  Ms. [name] is a 70 year old female with a past medical history of renal CA, CKD stage III, HTN and HLD. She presented to the ED on 01/07/16 with progressive SOB with exertion, orthopnea, and dry cough. She also reported chest pain that lasted only a few seconds that was associated with exertion as well. D -dimer elevated, VQ scan showed low probability of PE. CXR showed increased interstitial opacity and new effusions. Cath showed normal cors, and elevated pulmonary artery pressures of 23 mmHg. Echo showed normal LVEF of 60-65%, grade 3 DD,  Moderate MR, with a reversible restrictive pattern indicative of decreased LV diastolic compliance. 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 She was treated with IV diuretics for acute on chronic diastolic CHF. She was diuresed down to a dry weight of 175 lb (admit weight was 181 lb). She was discharged home on PO lasix, ASA, metoprolol and pravastatin.   She presents to clinic today for post hospital f/u. She reports that she has done well. No issues. She denies recurrent dyspnea. No CP. No edema. She has been fully compliant with her meds and checking weight daily at home which has remained stable. BP is well controlled at 115/68.    Current Outpatient Prescriptions  Medication Sig Dispense Refill  . aspirin EC 81 MG tablet Take 81 mg by mouth daily.    . furosemide (LASIX) 40 MG tablet Take 1 tablet (40 mg total) by mouth daily. 30 tablet 12  . metoprolol tartrate (LOPRESSOR) 25 MG tablet Take 0.5 tablets (12.5 mg total) by mouth 2 (two) times daily. 30 tablet 12  . potassium chloride (MICRO-K) 10 MEQ CR capsule Take 2 capsules (20 mEq total) by mouth daily. 30 capsule 12  .  pravastatin (PRAVACHOL) 80 MG tablet Take 80 mg by mouth daily.     No current facility-administered medications for this visit.    Allergies  Allergen Reactions  . Tramadol Shortness Of Breath, Palpitations and Other (See Comments)    Patient thinks that her recent symptoms of breathlessness may be related to taking this (??)    Social History   Social History  . Marital Status: Divorced    Spouse Name: N/A  . Number of Children: N/A  . Years of Education: N/A   Occupational History  . Not on file.   Social History Main Topics  . Smoking status: Never Smoker   . Smokeless tobacco: Not on file  . Alcohol Use: No  . Drug Use: No  . Sexual Activity: Not on file   Other Topics Concern  . Not on file   Social History Narrative     Review of Systems: General: negative for chills, fever, night sweats or weight changes.  Cardiovascular: negative for chest pain, dyspnea on exertion, edema, orthopnea, palpitations, paroxysmal nocturnal dyspnea or shortness of breath Dermatological: negative for rash Respiratory: negative for cough or wheezing Urologic: negative for hematuria Abdominal: negative for nausea, vomiting, diarrhea, bright red blood per rectum, melena, or hematemesis Neurologic: negative for visual changes, syncope, or dizziness All other systems reviewed and are otherwise negative except as noted above.    Blood pressure 115/68, pulse 67, height 5\' 4"  (1.626 m), weight 174 lb  12.8 oz (79.289 kg), last menstrual period 02/12/2013.  General appearance: alert, cooperative and no distress Neck: no carotid bruit and no JVD Lungs: clear to auscultation bilaterally Heart: regular rate and rhythm, S1, S2 normal, no murmur, click, rub or gallop Extremities: no LEE Pulses: 2+ and symmetric Skin: warm and dry Neurologic: Grossly normal  EKG NSR 67 bpm   ASSESSMENT AND PLAN:   1. Chronic Systolic HF: EF 123456. Grade 3 DD. New diagnosis. Significant MR. Unable to  get MRI to r/o restrictive DCM due to low GFR , single kidney and risk of NFS. LHC showed normal coronaries. She is euvolemic on physical exam. She remains at the same dry weight as her weight time of discharge, 175 lb Continue PO lasix, low sodium diet and daily weights + metoprolol. Given h/o CKD with single kidney, we will check a BMP today to ensure renal function and K are stable on PO lasix.   PLAN  Patient is suppose to f/u long term with Dr. Oval Linsey but, due to transportation issues, she is requesting to be followed here at our [facility] office. We will have her f/u with Dr. Cathie Olden who saw her also during her hospitalization. Patient instructed to return in 2-3 months.   Lyda Jester PA-C 01/30/2016 11:32 AM

## 2016-01-30 NOTE — Patient Instructions (Signed)
Medication Instructions:  none  Labwork: BMET today  Testing/Procedures: None  Follow-Up: Your physician recommends that you schedule a follow-up appointment in: 2-3 months with Dr. Acie Fredrickson.   Any Other Special Instructions Will Be Listed Below (If Applicable).     If you need a refill on your cardiac medications before your next appointment, please call your pharmacy.

## 2016-02-02 ENCOUNTER — Other Ambulatory Visit: Payer: Self-pay | Admitting: *Deleted

## 2016-02-02 ENCOUNTER — Other Ambulatory Visit: Payer: Self-pay

## 2016-02-02 DIAGNOSIS — N183 Chronic kidney disease, stage 3 unspecified: Secondary | ICD-10-CM

## 2016-02-02 NOTE — Patient Outreach (Signed)
[facility]) Care Management  02/02/2016  Katie [name] 1945/12/07 [id number]     EMMI-HF RED ON EMMI ALERT Day # 16 Date: 01/31/16 Red Alert Reason: "Questions about meds? Yes"   Outreach attempt #1 to patient. Spoke with patient. Red alert addressed. Patient denied any issues or questions regarding meds. She voices difficulty hearing automated questions accurately. No other RN CM needs at this time.    Plan: RN CM will notify [facility] administrative assistant of case closure.  Enzo Montgomery, RN,BSN,CCM [place] Management Telephonic Care Management Coordinator Direct Phone: [phone] Toll Free: [phone] Fax: [phone]

## 2016-02-06 ENCOUNTER — Other Ambulatory Visit: Payer: Self-pay

## 2016-02-06 NOTE — Patient Outreach (Signed)
[facility]) Care Management  02/06/2016  Katie [name] [birth year] [id number]     EMMI-HF RED ON EMMI ALERT Day # 21 Date: 02/05/16 Red Alert Reason: "Weighed themselves today? No"    Outreach attempt # 1 to patient. Patient reached. Addressed red alert. Patient states that she had not weighed when automated call came but she does weigh daily. She reports that she got a new scale, that is digital and likes it better as its easier for her to read the numbers. However, patient states that her old sale must have really been off or she was having trouble reading scale. Patient voices that baseline weight on new sale is 176 lbs compared to old scale reading on 167 lbs. She denies any fluid retention, wgt gain, edema and/or SOB. She complains of right pain hip and not being able to sleep on right side. Patient reported that she has been taking Tylenol prn with some relief. Patient inquiring if she should contact her cardiologist regarding the matter. Advised patient to follow up with PCP. She voiced understanding. No further needs or concerns at this time.     Plan: RN CM will notify [facility] administrative assistant of case closure status.   Enzo Montgomery, RN,BSN,CCM [place] Management Telephonic Care Management Coordinator Direct Phone: [phone] Toll Free: [phone] Fax: [phone]

## 2016-02-10 ENCOUNTER — Other Ambulatory Visit: Payer: Self-pay | Admitting: *Deleted

## 2016-02-10 NOTE — Patient Outreach (Signed)
[name]) Care Management  02/10/2016  Katie Katie [name] Dec 16, 1945 [id number]  Subjective: Telephone call to patient's home number, spoke with patient, and HIPAA verified.  Patient states she is doing fine.  States her baseline weight with new scales is 176, weighs daily,  and today's weight was 175.   States she has no swelling.   States she has tiredness, neck pain, and hoarse voice at times, since discharge from [name].    States she is managing her symptoms with Tylenol,  will consider contacting her primary MD if things get worse and as needed.   Patient states she does not have any disease management, disease monitoring, care coordination, transportation, or pharmacy needs at this time.    Objective:  Per chart review: Patient hospitalized 01/07/16 - 01/13/16 for congestive heart failure.   Patient also has a history of hyperlipidemia, hypertension, and chronic kidney disease stage 3.    Assessment: Received EMMI Congestive Heart Failure referral on 02/09/16.   EMMI Congestive Heart Failure follow up completed, no care management needs at this time.   Plan: RNCM will send case closure due to no care management needs/ follow up completed request to Katie Katie [name] at [place] Management.   Katie Katie [name] H. Katie Katie [name], BSN, [place] Management [facility] Katie [name] Telephonic CM Phone: [phone] Fax: [phone]

## 2016-02-20 ENCOUNTER — Other Ambulatory Visit: Payer: Self-pay | Admitting: *Deleted

## 2016-02-20 NOTE — Patient Outreach (Signed)
[facility]) Care Management  02/20/2016  Katie [name] 11/05/1945 [id number]  Subjective: Telephone call to patient's home number, spoke with patient, and HIPAA verified. Patient states she is doing fine. Discussed [facility] Heart Failure red alert follow up and patient voices understanding.  States she has been unable to weigh herself for the last few days because her new scales are no longer working.   States she will try to obtain new scales this weekend and call RNCM back next week for assistance if unable to obtain.   States she has no swelling or shortness of breath.  States her leg pain after the procedure is much better and able to self manage.  States she is following up with MD as instructed.  Patient states she does not have any disease management, disease monitoring, care coordination, transportation, or pharmacy needs at this time.   Objective: Per chart review: Patient hospitalized 01/07/16 - 01/13/16 for congestive heart failure. Patient also has a history of hyperlipidemia, hypertension, and chronic kidney disease stage 3.   Assessment: Received EMMI Congestive Heart Failure referral on 02/19/16 and 02/20/16.  Red alert trigger:  Patient answered no to question weighed themselves today.    EMMI Congestive Heart Failure follow up completed, no care management needs at this time.   Plan: RNCM will send case closure due to no care management needs/ follow up completed request to [facility] at [place] [facility].   Ayala Ribble H. Annia Friendly, BSN, [place] Management [facility] Telephonic CM Phone: [phone] Fax: [phone]

## 2016-03-09 DIAGNOSIS — I509 Heart failure, unspecified: Secondary | ICD-10-CM | POA: Diagnosis not present

## 2016-03-09 DIAGNOSIS — N183 Chronic kidney disease, stage 3 (moderate): Secondary | ICD-10-CM | POA: Diagnosis not present

## 2016-03-09 DIAGNOSIS — C641 Malignant neoplasm of right kidney, except renal pelvis: Secondary | ICD-10-CM | POA: Diagnosis not present

## 2016-03-09 DIAGNOSIS — I129 Hypertensive chronic kidney disease with stage 1 through stage 4 chronic kidney disease, or unspecified chronic kidney disease: Secondary | ICD-10-CM | POA: Diagnosis not present

## 2016-04-09 DIAGNOSIS — Z85528 Personal history of other malignant neoplasm of kidney: Secondary | ICD-10-CM | POA: Diagnosis not present

## 2016-04-09 DIAGNOSIS — E663 Overweight: Secondary | ICD-10-CM | POA: Diagnosis not present

## 2016-04-09 DIAGNOSIS — Z23 Encounter for immunization: Secondary | ICD-10-CM | POA: Diagnosis not present

## 2016-04-09 DIAGNOSIS — I509 Heart failure, unspecified: Secondary | ICD-10-CM | POA: Diagnosis not present

## 2016-04-09 DIAGNOSIS — Z905 Acquired absence of kidney: Secondary | ICD-10-CM | POA: Diagnosis not present

## 2016-04-09 DIAGNOSIS — N183 Chronic kidney disease, stage 3 (moderate): Secondary | ICD-10-CM | POA: Diagnosis not present

## 2016-04-09 DIAGNOSIS — Z1389 Encounter for screening for other disorder: Secondary | ICD-10-CM | POA: Diagnosis not present

## 2016-04-09 DIAGNOSIS — Z0000 Encounter for general adult medical examination without abnormal findings: Secondary | ICD-10-CM | POA: Diagnosis not present

## 2016-04-09 DIAGNOSIS — Z6832 Body mass index (BMI) 32.0-32.9, adult: Secondary | ICD-10-CM | POA: Diagnosis not present

## 2016-04-09 DIAGNOSIS — E7800 Pure hypercholesterolemia, unspecified: Secondary | ICD-10-CM | POA: Diagnosis not present

## 2016-04-09 DIAGNOSIS — I272 Other secondary pulmonary hypertension: Secondary | ICD-10-CM | POA: Diagnosis not present

## 2016-04-16 DIAGNOSIS — Z1211 Encounter for screening for malignant neoplasm of colon: Secondary | ICD-10-CM | POA: Diagnosis not present

## 2016-05-03 ENCOUNTER — Encounter: Payer: Self-pay | Admitting: Cardiovascular Disease

## 2016-05-03 ENCOUNTER — Encounter (INDEPENDENT_AMBULATORY_CARE_PROVIDER_SITE_OTHER): Payer: Self-pay

## 2016-05-03 ENCOUNTER — Ambulatory Visit (INDEPENDENT_AMBULATORY_CARE_PROVIDER_SITE_OTHER): Payer: PPO | Admitting: Cardiovascular Disease

## 2016-05-03 VITALS — BP 122/84 | HR 70 | Ht 62.5 in | Wt 180.4 lb

## 2016-05-03 DIAGNOSIS — I5032 Chronic diastolic (congestive) heart failure: Secondary | ICD-10-CM | POA: Diagnosis not present

## 2016-05-03 DIAGNOSIS — I340 Nonrheumatic mitral (valve) insufficiency: Secondary | ICD-10-CM

## 2016-05-03 DIAGNOSIS — N183 Chronic kidney disease, stage 3 (moderate): Secondary | ICD-10-CM

## 2016-05-03 DIAGNOSIS — I130 Hypertensive heart and chronic kidney disease with heart failure and stage 1 through stage 4 chronic kidney disease, or unspecified chronic kidney disease: Secondary | ICD-10-CM

## 2016-05-03 DIAGNOSIS — I272 Pulmonary hypertension, unspecified: Secondary | ICD-10-CM

## 2016-05-03 LAB — BASIC METABOLIC PANEL
BUN: 20 mg/dL (ref 7–25)
CO2: 29 mmol/L (ref 20–31)
Calcium: 9.8 mg/dL (ref 8.6–10.4)
Chloride: 105 mmol/L (ref 98–110)
Creat: 1.48 mg/dL — ABNORMAL HIGH (ref 0.60–0.93)
Glucose, Bld: 95 mg/dL (ref 65–99)
Potassium: 4.2 mmol/L (ref 3.5–5.3)
Sodium: 143 mmol/L (ref 135–146)

## 2016-05-03 NOTE — Patient Instructions (Signed)
Medication Instructions:  Your physician recommends that you continue on your current medications as directed. Please refer to the Current Medication list given to you today.   Labwork: TODAY - basic metabolic panel   Testing/Procedures: None Ordered   Follow-Up: Your physician wants you to follow-up in: 6 months with Dr. Nahser. You will receive a reminder letter in the mail two months in advance. If you don't receive a letter, please call our office to schedule the follow-up appointment.   If you need a refill on your cardiac medications before your next appointment, please call your pharmacy.   Thank you for choosing [facility]! Estevan Kersh, RN [phone]    

## 2016-05-03 NOTE — Progress Notes (Signed)
05/03/2016 Katie [name]   1946-03-20  [id number]  Primary Physician Kandice Hams, MD Primary Cardiologist: Dr. Acie Fredrickson  Problem list 1. Chronic diastolic congestive heart failure 2. Chronic kidney disease stage III 3. Essential hypertension 4. Hyperlipidemia  Reason for Visit/CC: [facility] f/u for Diastolic CHF  Notes from [facility],   [name] is a 70 year old female with a past medical history of renal CA, CKD stage III, HTN and HLD. She presented to the ED on 01/07/16 with progressive SOB with exertion, orthopnea, and dry cough. She also reported chest pain that lasted only a few seconds that was associated with exertion as well. D -dimer elevated, VQ scan showed low probability of PE. CXR showed increased interstitial opacity and new effusions. Cath showed normal cors, and elevated pulmonary artery pressures of 23 mmHg. Echo showed normal LVEF of 60-65%, grade 3 DD,  Moderate MR, with a reversible restrictive pattern indicative of decreased LV diastolic compliance. 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 She was treated with IV diuretics for acute on chronic diastolic CHF. She was diuresed down to a dry weight of 175 lb (admit weight was 181 lb). She was discharged home on PO lasix, ASA, metoprolol and pravastatin.   She presents to clinic today for post hospital f/u. She reports that she has done well. No issues. She denies recurrent dyspnea. No CP. No edema. She has been fully compliant with her meds and checking weight daily at home which has remained stable. BP is well controlled at 115/68.  Oct. 2, 2017:  Doing well. No CP or dyspnea  Has gained a litte weight.   Has some fatigue on occasion    Current Outpatient Prescriptions  Medication Sig Dispense Refill  . aspirin EC 81 MG tablet Take 81 mg by mouth daily.    . furosemide (LASIX) 40 MG tablet Take 1 tablet (40 mg  total) by mouth daily. 30 tablet 12  . metoprolol tartrate (LOPRESSOR) 25 MG tablet Take 0.5 tablets (12.5 mg total) by mouth 2 (two) times daily. 30 tablet 12  . potassium chloride (MICRO-K) 10 MEQ CR capsule Take 2 capsules (20 mEq total) by mouth daily. 30 capsule 12  . pravastatin (PRAVACHOL) 80 MG tablet Take 80 mg by mouth daily.     No current facility-administered medications for this visit.     Allergies  Allergen Reactions  . Tramadol Shortness Of Breath, Palpitations and Other (See Comments)    Patient thinks that her recent symptoms of breathlessness may be related to taking this (??)    Social History   Social History  . Marital status: Divorced    Spouse name: N/A  . Number of children: N/A  . Years of education: N/A   Occupational History  . Not on file.   Social History Main Topics  . Smoking status: Never Smoker  . Smokeless tobacco: Not on file  . Alcohol use No  . Drug use: No  . Sexual activity: Not on file   Other Topics Concern  . Not on file   Social History Narrative  . No narrative on file     Review of Systems: General: negative for chills, fever, night sweats or weight changes.  Cardiovascular: negative for chest pain, dyspnea on exertion, edema, orthopnea, palpitations, paroxysmal nocturnal dyspnea or shortness of breath Dermatological: negative for rash Respiratory: negative for cough or wheezing  Urologic: negative for hematuria Abdominal: negative for nausea, vomiting, diarrhea, bright red blood per rectum, melena, or hematemesis Neurologic: negative for visual changes, syncope, or dizziness All other systems reviewed and are otherwise negative except as noted above.    Blood pressure 122/84, pulse 70, height 5' 2.5" (1.588 m), weight 180 lb 6.4 oz (81.8 kg), last menstrual period 02/12/2013, SpO2 99 %.  General appearance: alert, cooperative and no distress Neck: no carotid bruit and no JVD Lungs: clear to auscultation  bilaterally Heart: regular rate and rhythm, S1, S2 normal,  Soft systolic murmur, No  click, rub or gallop Extremities: no LEE Pulses: 2+ and symmetric Skin: warm and dry Neurologic: Grossly normal  EKG NSR 67 bpm   ASSESSMENT AND PLAN:   1. Chronic  Diastolic  HF: EF 58-34%. Grade 3 DD. New diagnosis.  Moderate  MR. Unable to get MRI to r/o restrictive DCM due to low GFR , single kidney and risk of NFS. LHC showed normal coronaries. She is euvolemic on physical exam.  Continue current meds.  Will check BMP today    2. Mitral regurgitation  Asymptomatic , moderate     [name], MD  05/03/2016 8:24 AM    [facility] [place],  [address], [place]  [id number] Pager [phone] Phone: [phone]; Fax: [phone]

## 2016-07-21 ENCOUNTER — Other Ambulatory Visit: Payer: Self-pay | Admitting: Internal Medicine

## 2016-07-21 ENCOUNTER — Ambulatory Visit
Admission: RE | Admit: 2016-07-21 | Discharge: 2016-07-21 | Disposition: A | Discharge status: Home / Self Care | Payer: PPO | Source: Ambulatory Visit | Attending: Internal Medicine | Admitting: Internal Medicine

## 2016-07-21 DIAGNOSIS — R55 Syncope and collapse: Secondary | ICD-10-CM | POA: Diagnosis not present

## 2016-07-21 DIAGNOSIS — H538 Other visual disturbances: Secondary | ICD-10-CM | POA: Diagnosis not present

## 2016-07-21 DIAGNOSIS — R519 Headache, unspecified: Secondary | ICD-10-CM

## 2016-07-21 DIAGNOSIS — R51 Headache: Principal | ICD-10-CM

## 2016-07-21 DIAGNOSIS — I509 Heart failure, unspecified: Secondary | ICD-10-CM | POA: Diagnosis not present

## 2016-07-21 DIAGNOSIS — R42 Dizziness and giddiness: Secondary | ICD-10-CM | POA: Diagnosis not present

## 2016-07-21 DIAGNOSIS — G4489 Other headache syndrome: Secondary | ICD-10-CM | POA: Diagnosis not present

## 2016-07-22 DIAGNOSIS — R55 Syncope and collapse: Secondary | ICD-10-CM | POA: Diagnosis not present

## 2016-07-22 DIAGNOSIS — R930 Abnormal findings on diagnostic imaging of skull and head, not elsewhere classified: Secondary | ICD-10-CM | POA: Diagnosis not present

## 2016-07-22 DIAGNOSIS — R42 Dizziness and giddiness: Secondary | ICD-10-CM | POA: Diagnosis not present

## 2016-07-22 DIAGNOSIS — G4489 Other headache syndrome: Secondary | ICD-10-CM | POA: Diagnosis not present

## 2016-07-28 DIAGNOSIS — H40023 Open angle with borderline findings, high risk, bilateral: Secondary | ICD-10-CM | POA: Diagnosis not present

## 2016-07-30 ENCOUNTER — Other Ambulatory Visit: Payer: Self-pay

## 2016-07-30 MED ORDER — POTASSIUM CHLORIDE ER 10 MEQ PO CPCR: 20.0000 meq | ORAL_CAPSULE | Freq: Every day | ORAL | 11 refills | Status: DC

## 2016-08-12 DIAGNOSIS — I5032 Chronic diastolic (congestive) heart failure: Secondary | ICD-10-CM | POA: Diagnosis not present

## 2016-08-12 DIAGNOSIS — R42 Dizziness and giddiness: Secondary | ICD-10-CM | POA: Diagnosis not present

## 2016-08-12 DIAGNOSIS — E7800 Pure hypercholesterolemia, unspecified: Secondary | ICD-10-CM | POA: Diagnosis not present

## 2016-08-20 ENCOUNTER — Encounter (HOSPITAL_COMMUNITY): Payer: Self-pay | Admitting: Emergency Medicine

## 2016-08-20 ENCOUNTER — Emergency Department (HOSPITAL_COMMUNITY): Payer: PPO

## 2016-08-20 DIAGNOSIS — Z79899 Other long term (current) drug therapy: Secondary | ICD-10-CM | POA: Insufficient documentation

## 2016-08-20 DIAGNOSIS — R05 Cough: Secondary | ICD-10-CM | POA: Diagnosis not present

## 2016-08-20 DIAGNOSIS — I5032 Chronic diastolic (congestive) heart failure: Secondary | ICD-10-CM | POA: Insufficient documentation

## 2016-08-20 DIAGNOSIS — Z85528 Personal history of other malignant neoplasm of kidney: Secondary | ICD-10-CM | POA: Diagnosis not present

## 2016-08-20 DIAGNOSIS — R509 Fever, unspecified: Secondary | ICD-10-CM | POA: Diagnosis not present

## 2016-08-20 DIAGNOSIS — N183 Chronic kidney disease, stage 3 (moderate): Secondary | ICD-10-CM | POA: Insufficient documentation

## 2016-08-20 DIAGNOSIS — I130 Hypertensive heart and chronic kidney disease with heart failure and stage 1 through stage 4 chronic kidney disease, or unspecified chronic kidney disease: Secondary | ICD-10-CM | POA: Insufficient documentation

## 2016-08-20 DIAGNOSIS — Z7982 Long term (current) use of aspirin: Secondary | ICD-10-CM | POA: Insufficient documentation

## 2016-08-20 DIAGNOSIS — J069 Acute upper respiratory infection, unspecified: Secondary | ICD-10-CM | POA: Diagnosis not present

## 2016-08-20 LAB — CBC
HCT: 40.7 % (ref 36.0–46.0)
Hemoglobin: 13.4 g/dL (ref 12.0–15.0)
MCH: 30.5 pg (ref 26.0–34.0)
MCHC: 32.9 g/dL (ref 30.0–36.0)
MCV: 92.5 fL (ref 78.0–100.0)
Platelets: 295 10*3/uL (ref 150–400)
RBC: 4.4 MIL/uL (ref 3.87–5.11)
RDW: 13.6 % (ref 11.5–15.5)
WBC: 6.1 10*3/uL (ref 4.0–10.5)

## 2016-08-20 LAB — BASIC METABOLIC PANEL
Anion gap: 8 (ref 5–15)
BUN: 14 mg/dL (ref 6–20)
CO2: 25 mmol/L (ref 22–32)
Calcium: 9.5 mg/dL (ref 8.9–10.3)
Chloride: 105 mmol/L (ref 101–111)
Creatinine, Ser: 1.53 mg/dL — ABNORMAL HIGH (ref 0.44–1.00)
GFR calc Af Amer: 38 mL/min — ABNORMAL LOW (ref >60)
GFR calc non Af Amer: 33 mL/min — ABNORMAL LOW (ref >60)
Glucose, Bld: 94 mg/dL (ref 65–99)
Potassium: 4.1 mmol/L (ref 3.5–5.1)
Sodium: 138 mmol/L (ref 135–145)

## 2016-08-20 LAB — BRAIN NATRIURETIC PEPTIDE: B Natriuretic Peptide: 166.9 pg/mL — ABNORMAL HIGH (ref 0.0–100.0)

## 2016-08-20 MED ORDER — ACETAMINOPHEN 325 MG PO TABS
650.0000 mg | ORAL_TABLET | Freq: Once | ORAL | Status: AC
  Administered 2016-08-20: 650 mg via ORAL

## 2016-08-20 MED ORDER — ACETAMINOPHEN 325 MG PO TABS
ORAL_TABLET | ORAL | Status: AC
  Filled 2016-08-20: qty 2

## 2016-08-20 NOTE — ED Notes (Signed)
Pt now states she has had cough and chest congestion at home

## 2016-08-20 NOTE — ED Triage Notes (Signed)
Pt presents to ED for assessment of consistent headaches x 1 month.  Pt also c/o pain in her eyes.  Pt sts it may feel like the headache behind her eyes.  Pt also c/o generalized body aches and recent fevers at home.

## 2016-08-20 NOTE — ED Notes (Signed)
Pt has a hx of CHF with cough and some swelling noted to bilateral lower limbs.  BNP verbal order obtained.

## 2016-08-21 ENCOUNTER — Emergency Department (HOSPITAL_COMMUNITY)
Admission: EM | Admit: 2016-08-21 | Discharge: 2016-08-21 | Disposition: A | Discharge status: Home / Self Care | Payer: PPO | Attending: Emergency Medicine | Admitting: Emergency Medicine

## 2016-08-21 DIAGNOSIS — B9789 Other viral agents as the cause of diseases classified elsewhere: Secondary | ICD-10-CM

## 2016-08-21 DIAGNOSIS — J069 Acute upper respiratory infection, unspecified: Secondary | ICD-10-CM

## 2016-08-21 MED ORDER — METOCLOPRAMIDE HCL 10 MG PO TABS
10.0000 mg | ORAL_TABLET | Freq: Once | ORAL | Status: AC
  Administered 2016-08-21: 10 mg via ORAL
  Filled 2016-08-21: qty 1

## 2016-08-21 MED ORDER — IPRATROPIUM BROMIDE 0.02 % IN SOLN
1.0000 mg | Freq: Once | RESPIRATORY_TRACT | Status: AC
  Administered 2016-08-21: 1 mg via RESPIRATORY_TRACT
  Filled 2016-08-21: qty 5

## 2016-08-21 MED ORDER — KETOROLAC TROMETHAMINE 30 MG/ML IJ SOLN
30.0000 mg | Freq: Once | INTRAMUSCULAR | Status: AC
  Administered 2016-08-21: 30 mg via INTRAVENOUS
  Filled 2016-08-21: qty 1

## 2016-08-21 MED ORDER — ALBUTEROL (5 MG/ML) CONTINUOUS INHALATION SOLN
10.0000 mg/h | INHALATION_SOLUTION | RESPIRATORY_TRACT | Status: DC
  Administered 2016-08-21: 10 mg/h via RESPIRATORY_TRACT
  Filled 2016-08-21: qty 20

## 2016-08-21 MED ORDER — ACETAMINOPHEN 500 MG PO TABS
1000.0000 mg | ORAL_TABLET | Freq: Once | ORAL | Status: AC
  Administered 2016-08-21: 1000 mg via ORAL
  Filled 2016-08-21: qty 2

## 2016-08-21 MED ORDER — ALBUTEROL SULFATE HFA 108 (90 BASE) MCG/ACT IN AERS
2.0000 | INHALATION_SPRAY | Freq: Once | RESPIRATORY_TRACT | Status: AC
  Administered 2016-08-21: 2 via RESPIRATORY_TRACT
  Filled 2016-08-21: qty 6.7

## 2016-08-21 MED ORDER — SODIUM CHLORIDE 0.9 % IV BOLUS (SEPSIS)
1000.0000 mL | Freq: Once | INTRAVENOUS | Status: AC
  Administered 2016-08-21: 1000 mL via INTRAVENOUS

## 2016-08-21 MED ORDER — PREDNISONE 20 MG PO TABS
60.0000 mg | ORAL_TABLET | Freq: Once | ORAL | Status: AC
  Administered 2016-08-21: 60 mg via ORAL
  Filled 2016-08-21: qty 3

## 2016-08-21 MED ORDER — MAGNESIUM SULFATE 2 GM/50ML IV SOLN
2.0000 g | Freq: Once | INTRAVENOUS | Status: AC
  Administered 2016-08-21: 2 g via INTRAVENOUS
  Filled 2016-08-21: qty 50

## 2016-08-21 MED ORDER — PREDNISONE 20 MG PO TABS: 60.0000 mg | ORAL_TABLET | Freq: Every day | ORAL | 0 refills | Status: DC

## 2016-08-21 NOTE — ED Notes (Signed)
The pts hhn is almost finished and so is the mag sulfate

## 2016-08-21 NOTE — ED Notes (Signed)
Iv med infused   The pt has no more pain very sleepy  No distress

## 2016-08-21 NOTE — ED Notes (Signed)
Headache for 2 days  No n or v  Hx of headaches

## 2016-08-21 NOTE — ED Notes (Signed)
The pts last 3 bps have been lower than the entire time she has been here.  Dr Claudine Mouton informed  And he will see

## 2016-08-21 NOTE — ED Provider Notes (Signed)
[facility] DEPT Provider Note   CSN: [id number] Arrival date & time: 08/20/16  1904  By signing my name below, I, Evelene Croon, attest that this documentation has been prepared under the direction and in the presence of Everlene Balls, MD . Electronically Signed: Evelene Croon, Scribe. 08/21/2016. 3:18 AM.  History   Chief Complaint Chief Complaint  Patient presents with  . Headache  . Eye Pain  . Fever     The history is provided by the patient. No language interpreter was used.     HPI Comments:  Katie [name] is a 71 y.o. female who presents to the Emergency Department complaining of a persistent HA x 2 days with radiation of pain into her eyes. She is also complaining of a productive cough with clear sputum and associated  Rhinorrhea and fever, TMAX 100.9. She has taken cough syrup and theraflu without relief. No flu shot received this season.   Past Medical History:  Diagnosis Date  . 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 History of kidney cancer    a. s/p right nephrectomy ~2007.  Marland Kitchen Hypercholesterolemia   . Hypertension   . Moderate mitral regurgitation 01/08/2016  . Moderate to severe pulmonary hypertension 01/08/2016  . Moderate tricuspid regurgitation 01/08/2016    Patient Active Problem List   Diagnosis Date Noted  . Chronic diastolic CHF (congestive heart failure) ([place]) 05/04/2016  . Chest pain   . Moderate mitral regurgitation 01/08/2016  . Moderate tricuspid regurgitation 01/08/2016  . Moderate to severe pulmonary hypertension 01/08/2016  . Acute CHF (congestive heart failure) ([place]) 01/07/2016  . Elevated troponin 01/07/2016  . Mild hypertension 01/07/2016  . CKD (chronic kidney disease), stage III 01/07/2016  . Hyperlipidemia 01/07/2016  . Acute CHF ([place]) 01/07/2016    Past Surgical History:  Procedure Laterality Date  . CARDIAC CATHETERIZATION N/A 01/12/2016   Procedure: Right/Left Heart Cath and  Coronary/Graft Angiography;  Surgeon: Belva Crome, MD;  Location: [facility];  Service: Cardiovascular;  Laterality: N/A;  . CERVICAL POLYPECTOMY N/A 04/23/2013   Procedure: CERVICAL POLYPECTOMY;  Surgeon: Maeola Sarah. Landry Mellow, MD;  Location: [facility];  Service: Gynecology;  Laterality: N/A;  Possible Polypectomy  . HYSTEROSCOPY W/D&C N/A 04/23/2013   Procedure: DILATATION AND CURETTAGE /HYSTEROSCOPY;  Surgeon: Maeola Sarah. Landry Mellow, MD;  Location: [facility];  Service: Gynecology;  Laterality: N/A;  . KNEE ARTHROSCOPY Left   . NEPHRECTOMY Right    due to carcinoma    OB History    No data available       Home Medications    Prior to Admission medications   Medication Sig Start Date End Date Taking? Authorizing Provider  aspirin EC 81 MG tablet Take 81 mg by mouth daily.    Historical Provider, MD  furosemide (LASIX) 40 MG tablet Take 1 tablet (40 mg total) by mouth daily. 01/13/16   Arbutus Leas, NP  metoprolol tartrate (LOPRESSOR) 25 MG tablet Take 0.5 tablets (12.5 mg total) by mouth 2 (two) times daily. 01/13/16   Arbutus Leas, NP  potassium chloride (MICRO-K) 10 MEQ CR capsule Take 2 capsules (20 mEq total) by mouth daily. 07/30/16   Belva Crome, MD  pravastatin (PRAVACHOL) 80 MG tablet Take 80 mg by mouth daily.    Historical Provider, MD    Family History Family History  Problem Relation Age of Onset  . Stroke Brother   . Diabetes Mellitus II Sister  Social History Social History  Substance Use Topics  . Smoking status: Never Smoker  . Smokeless tobacco: Never Used  . Alcohol use No     Allergies   Tramadol   Review of Systems Review of Systems 10 systems reviewed and all are negative for acute change except as noted in the HPI.   Physical Exam Updated Vital Signs BP 157/91   Pulse 94   Temp 98.5 F (36.9 C) (Oral)   Resp 12   Ht 5\' 4"  (1.626 m)   Wt 176 lb (79.8 kg)   LMP 02/12/2013   SpO2 94%   BMI 30.21 kg/m   Physical Exam  Constitutional: She is  oriented to person, place, and time. She appears well-developed and well-nourished. No distress.  HENT:  Head: Normocephalic and atraumatic.  Nose: Nose normal.  Mouth/Throat: Oropharynx is clear and moist. No oropharyngeal exudate.  Eyes: Conjunctivae and EOM are normal. Pupils are equal, round, and reactive to light. No scleral icterus.  Neck: Normal range of motion. Neck supple. No JVD present. No tracheal deviation present. No thyromegaly present.  Cardiovascular: Regular rhythm and normal heart sounds.  Tachycardia present.  Exam reveals no gallop and no friction rub.   No murmur heard. Pulmonary/Chest: Effort normal. No respiratory distress. She has wheezes. She exhibits no tenderness.  Inspiratory and expiratory wheezing  Abdominal: Soft. Bowel sounds are normal. She exhibits no distension and no mass. There is no tenderness. There is no rebound and no guarding.  Musculoskeletal: Normal range of motion. She exhibits no edema or tenderness.  Lymphadenopathy:    She has no cervical adenopathy.  Neurological: She is alert and oriented to person, place, and time. No cranial nerve deficit. She exhibits normal muscle tone.  Skin: Skin is warm and dry. No rash noted. No erythema. No pallor.  Tactile fever  Nursing note and vitals reviewed.    ED Treatments / Results   DIAGNOSTIC STUDIES:  Oxygen Saturation is 94% on RA, adequate by my interpretation.    COORDINATION OF CARE:  3:16 AM Discussed treatment plan with pt at bedside and pt agreed to plan. Labs (all labs ordered are listed, but only abnormal results are displayed) Labs Reviewed  BASIC METABOLIC PANEL - Abnormal; Notable for the following:       Result Value   Creatinine, Ser 1.53 (*)    GFR calc non Af Amer 33 (*)    GFR calc Af Amer 38 (*)    All other components within normal limits  BRAIN NATRIURETIC PEPTIDE - Abnormal; Notable for the following:    B Natriuretic Peptide 166.9 (*)    All other components within  normal limits  CBC    EKG  EKG Interpretation None       Radiology Dg Chest 1 View  Result Date: 08/20/2016 CLINICAL DATA:  Cough with congestion. EXAM: CHEST 1 VIEW COMPARISON:  Chest x-rays dated 01/07/2016 and 10/17/2015. FINDINGS: Heart size is upper normal. Lungs are clear. No pleural effusion or pneumothorax seen. No acute or suspicious osseous finding IMPRESSION: No active disease.  No evidence of pneumonia or pulmonary edema. Electronically Signed   By: Franki Cabot M.D.   On: 08/20/2016 21:37    Procedures Procedures (including critical care time)  Medications Ordered in ED Medications  acetaminophen (TYLENOL) 325 MG tablet (not administered)  acetaminophen (TYLENOL) tablet 650 mg (650 mg Oral Given 08/20/16 2027)     Initial Impression / Assessment and Plan / ED Course  I have  reviewed the triage vital signs and the nursing notes.  Pertinent labs & imaging results that were available during my care of the patient were reviewed by me and considered in my medical decision making (see chart for details).    Patient presents to the ED for headache, productive cough, rhinorrhea and fever.  She has wheezing on exam as well. CXR neg for pneumonia.  Will give nebs, steroids, and mag for treatment. Also reglan and toradol for headache.  Will reassess.  6:30 AM Upon repeat evaluation, wheezing has improved.  Symptomatically, patient feels much better. DC with albuterol inhaler and prednisone.  PCP fu advised within 3 days. She appears well and in NAD.  VS remain within her normal limits, there is now mild tachycardia, likely due to the albuterol treatment, patient is safe for DC.   Final Clinical Impressions(s) / ED Diagnoses   Final diagnoses:  None    New Prescriptions New Prescriptions   No medications on file   I personally performed the services described in this documentation, which was scribed in my presence. The recorded information has been reviewed and is  accurate.       Everlene Balls, MD 08/21/16 [phone]

## 2016-08-21 NOTE — ED Notes (Signed)
MD Delo at the bedside.  

## 2016-08-26 DIAGNOSIS — R42 Dizziness and giddiness: Secondary | ICD-10-CM | POA: Diagnosis not present

## 2016-08-26 DIAGNOSIS — J209 Acute bronchitis, unspecified: Secondary | ICD-10-CM | POA: Diagnosis not present

## 2016-09-02 DIAGNOSIS — J209 Acute bronchitis, unspecified: Secondary | ICD-10-CM | POA: Diagnosis not present

## 2016-09-06 ENCOUNTER — Other Ambulatory Visit: Payer: Self-pay | Admitting: Internal Medicine

## 2016-09-06 DIAGNOSIS — Z1231 Encounter for screening mammogram for malignant neoplasm of breast: Secondary | ICD-10-CM

## 2016-09-08 DIAGNOSIS — Z634 Disappearance and death of family member: Secondary | ICD-10-CM | POA: Diagnosis not present

## 2016-09-08 DIAGNOSIS — N183 Chronic kidney disease, stage 3 (moderate): Secondary | ICD-10-CM | POA: Diagnosis not present

## 2016-09-08 DIAGNOSIS — I509 Heart failure, unspecified: Secondary | ICD-10-CM | POA: Diagnosis not present

## 2016-09-08 DIAGNOSIS — E785 Hyperlipidemia, unspecified: Secondary | ICD-10-CM | POA: Diagnosis not present

## 2016-09-08 DIAGNOSIS — I129 Hypertensive chronic kidney disease with stage 1 through stage 4 chronic kidney disease, or unspecified chronic kidney disease: Secondary | ICD-10-CM | POA: Diagnosis not present

## 2016-10-04 ENCOUNTER — Ambulatory Visit
Admission: RE | Admit: 2016-10-04 | Discharge: 2016-10-04 | Disposition: A | Discharge status: Home / Self Care | Payer: PPO | Source: Ambulatory Visit | Attending: Internal Medicine | Admitting: Internal Medicine

## 2016-10-04 DIAGNOSIS — Z1231 Encounter for screening mammogram for malignant neoplasm of breast: Secondary | ICD-10-CM | POA: Diagnosis not present

## 2016-10-07 DIAGNOSIS — I509 Heart failure, unspecified: Secondary | ICD-10-CM | POA: Diagnosis not present

## 2016-10-07 DIAGNOSIS — E7800 Pure hypercholesterolemia, unspecified: Secondary | ICD-10-CM | POA: Diagnosis not present

## 2016-10-07 DIAGNOSIS — N183 Chronic kidney disease, stage 3 (moderate): Secondary | ICD-10-CM | POA: Diagnosis not present

## 2016-10-12 ENCOUNTER — Encounter: Payer: Self-pay | Admitting: Cardiovascular Disease

## 2016-10-18 ENCOUNTER — Ambulatory Visit (INDEPENDENT_AMBULATORY_CARE_PROVIDER_SITE_OTHER): Payer: PPO | Admitting: Cardiovascular Disease

## 2016-10-18 ENCOUNTER — Encounter (INDEPENDENT_AMBULATORY_CARE_PROVIDER_SITE_OTHER): Payer: Self-pay

## 2016-10-18 ENCOUNTER — Encounter: Payer: Self-pay | Admitting: Cardiovascular Disease

## 2016-10-18 VITALS — BP 110/72 | HR 79 | Ht 64.0 in | Wt 167.4 lb

## 2016-10-18 DIAGNOSIS — I5032 Chronic diastolic (congestive) heart failure: Secondary | ICD-10-CM

## 2016-10-18 NOTE — Progress Notes (Signed)
10/18/2016 Katie [name]   [birth year]  [id number]  Primary Physician Kandice Hams, MD Primary Cardiologist: Dr. Acie Fredrickson  Problem list 1. Chronic diastolic congestive heart failure 2. Chronic kidney disease stage III 3. Essential hypertension 4. Hyperlipidemia  Reason for Visit/CC: [facility] f/u for Diastolic CHF  Notes from [facility],   Katie [name] is a 71 year old female with a past medical history of renal CA, CKD stage III, HTN and HLD. She presented to the ED on 01/07/16 with progressive SOB with exertion, orthopnea, and dry cough. She also reported chest pain that lasted only a few seconds that was associated with exertion as well. D -dimer elevated, VQ scan showed low probability of PE. CXR showed increased interstitial opacity and new effusions. Cath showed normal cors, and elevated pulmonary artery pressures of 23 mmHg. Echo showed normal LVEF of 60-65%, grade 3 DD,  Moderate MR, with a reversible restrictive pattern indicative of decreased LV diastolic compliance. 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 She was treated with IV diuretics for acute on chronic diastolic CHF. She was diuresed down to a dry weight of 175 lb (admit weight was 181 lb). She was discharged home on PO lasix, ASA, metoprolol and pravastatin.   She presents to clinic today for post hospital f/u. She reports that she has done well. No issues. She denies recurrent dyspnea. No CP. No edema. She has been fully compliant with her meds and checking weight daily at home which has remained stable. BP is well controlled at 115/68.  Oct. 2, 2017:  Doing well. No CP or dyspnea  Has gained a litte weight.   Has some fatigue on occasion  Mach 19, 2018:  Katie [name] is seen back for follow up of her chronic diastolic CHF . Had an episode of hypotension.   was seen in the ER Lasix dose was decreased from 40 a day to 20  mg a day  Seems to be working well   Current Outpatient Prescriptions  Medication Sig Dispense Refill  . aspirin EC 81 MG tablet Take 81 mg by mouth daily.    . furosemide (LASIX) 20 MG tablet Take 10 mg by mouth daily.  0  . metoprolol tartrate (LOPRESSOR) 25 MG tablet Take 0.5 tablets (12.5 mg total) by mouth 2 (two) times daily. 30 tablet 12  . potassium chloride (MICRO-K) 10 MEQ CR capsule Take 2 capsules (20 mEq total) by mouth daily. 60 capsule 11  . pravastatin (PRAVACHOL) 80 MG tablet Take 80 mg by mouth daily.    . predniSONE (DELTASONE) 20 MG tablet Take 3 tablets (60 mg total) by mouth daily. 12 tablet 0   No current facility-administered medications for this visit.     Allergies  Allergen Reactions  . Tramadol Shortness Of Breath, Palpitations and Other (See Comments)    Patient thinks that her recent symptoms of breathlessness may be related to taking this (??)    Social History   Social History  . Marital status: Divorced    Spouse name: N/A  . Number of children: N/A  . Years of education: N/A   Occupational History  . Not on file.   Social History Main Topics  . Smoking status: Never Smoker  . Smokeless tobacco: Never Used  . Alcohol use No  . Drug use: No  . Sexual activity: Not on file   Other Topics  Concern  . Not on file   Social History Narrative  . No narrative on file     Review of Systems: General: negative for chills, fever, night sweats or weight changes.  Cardiovascular: negative for chest pain, dyspnea on exertion, edema, orthopnea, palpitations, paroxysmal nocturnal dyspnea or shortness of breath Dermatological: negative for rash Respiratory: negative for cough or wheezing Urologic: negative for hematuria Abdominal: negative for nausea, vomiting, diarrhea, bright red blood per rectum, melena, or hematemesis Neurologic: negative for visual changes, syncope, or dizziness All other systems reviewed and are otherwise negative except as  noted above.    Blood pressure 110/72, pulse 79, height 5\' 4"  (1.626 m), weight 167 lb 6.4 oz (75.9 kg), last menstrual period 02/12/2013, SpO2 97 %.  General appearance: alert, cooperative and no distress Neck: no carotid bruit and no JVD Lungs: clear to auscultation bilaterally Heart: regular rate and rhythm, S1, S2 normal,  Soft systolic murmur, No  click, rub or gallop Extremities: no LEE Pulses: 2+ and symmetric Skin: warm and dry Neurologic: Grossly normal  EKG :  Personally reviewed October 18, 2016:  NSR at 83.  No ST or T wave ab.  ASSESSMENT AND PLAN:   1. Chronic  Diastolic  HF: EF 31-51%. Grade 3 DD.    Moderate  MR. Unable to get MRI to r/o restrictive DCM due to low GFR , single kidney and risk of NFS. LHC showed normal coronaries. She is euvolemic on physical exam.  Continue current meds.  Seems to be doing well on current dose of Lasix    2. Mitral regurgitation  Asymptomatic , moderate     [name], MD  10/18/2016 10:54 AM    [facility] [place],  [place], [place]  [id number] Pager [phone] Phone: [phone]; Fax: [phone]

## 2016-10-18 NOTE — Patient Instructions (Signed)
Medication Instructions:  Your physician recommends that you continue on your current medications as directed. Please refer to the Current Medication list given to you today.   Labwork: None Ordered   Testing/Procedures: None Ordered   Follow-Up: Your physician wants you to follow-up in: 6 months with Dr. Nahser.  You will receive a reminder letter in the mail two months in advance. If you don't receive a letter, please call our office to schedule the follow-up appointment.   If you need a refill on your cardiac medications before your next appointment, please call your pharmacy.   Thank you for choosing [facility]! Chrisanne Loose, RN [phone]    

## 2016-10-26 DIAGNOSIS — H2513 Age-related nuclear cataract, bilateral: Secondary | ICD-10-CM | POA: Diagnosis not present

## 2016-10-26 DIAGNOSIS — H35033 Hypertensive retinopathy, bilateral: Secondary | ICD-10-CM | POA: Diagnosis not present

## 2016-10-26 DIAGNOSIS — H25013 Cortical age-related cataract, bilateral: Secondary | ICD-10-CM | POA: Diagnosis not present

## 2016-10-26 DIAGNOSIS — H40023 Open angle with borderline findings, high risk, bilateral: Secondary | ICD-10-CM | POA: Diagnosis not present

## 2016-12-06 DIAGNOSIS — I509 Heart failure, unspecified: Secondary | ICD-10-CM | POA: Diagnosis not present

## 2016-12-06 DIAGNOSIS — N183 Chronic kidney disease, stage 3 (moderate): Secondary | ICD-10-CM | POA: Diagnosis not present

## 2016-12-06 DIAGNOSIS — R600 Localized edema: Secondary | ICD-10-CM | POA: Diagnosis not present

## 2016-12-07 DIAGNOSIS — H40052 Ocular hypertension, left eye: Secondary | ICD-10-CM | POA: Diagnosis not present

## 2016-12-07 DIAGNOSIS — H401131 Primary open-angle glaucoma, bilateral, mild stage: Secondary | ICD-10-CM | POA: Diagnosis not present

## 2017-01-24 ENCOUNTER — Other Ambulatory Visit: Payer: Self-pay | Admitting: *Deleted

## 2017-01-24 MED ORDER — METOPROLOL TARTRATE 25 MG PO TABS: 12.5000 mg | ORAL_TABLET | Freq: Two times a day (BID) | ORAL | 8 refills | Status: DC

## 2017-02-10 ENCOUNTER — Inpatient Hospital Stay (HOSPITAL_COMMUNITY): Payer: PPO

## 2017-02-10 ENCOUNTER — Emergency Department (HOSPITAL_COMMUNITY): Payer: PPO

## 2017-02-10 ENCOUNTER — Encounter (HOSPITAL_COMMUNITY): Payer: Self-pay | Admitting: Emergency Medicine

## 2017-02-10 ENCOUNTER — Inpatient Hospital Stay (HOSPITAL_COMMUNITY)
Admission: EM | Admit: 2017-02-10 | Discharge: 2017-02-12 | DRG: 065 | Disposition: A | Discharge status: Home / Self Care | Payer: PPO | Attending: Internal Medicine | Admitting: Internal Medicine

## 2017-02-10 DIAGNOSIS — I130 Hypertensive heart and chronic kidney disease with heart failure and stage 1 through stage 4 chronic kidney disease, or unspecified chronic kidney disease: Secondary | ICD-10-CM | POA: Diagnosis present

## 2017-02-10 DIAGNOSIS — H538 Other visual disturbances: Secondary | ICD-10-CM | POA: Diagnosis present

## 2017-02-10 DIAGNOSIS — I5032 Chronic diastolic (congestive) heart failure: Secondary | ICD-10-CM | POA: Diagnosis present

## 2017-02-10 DIAGNOSIS — Z7982 Long term (current) use of aspirin: Secondary | ICD-10-CM

## 2017-02-10 DIAGNOSIS — I081 Rheumatic disorders of both mitral and tricuspid valves: Secondary | ICD-10-CM | POA: Diagnosis present

## 2017-02-10 DIAGNOSIS — Z79899 Other long term (current) drug therapy: Secondary | ICD-10-CM

## 2017-02-10 DIAGNOSIS — R29700 NIHSS score 0: Secondary | ICD-10-CM | POA: Diagnosis present

## 2017-02-10 DIAGNOSIS — E663 Overweight: Secondary | ICD-10-CM | POA: Diagnosis not present

## 2017-02-10 DIAGNOSIS — N183 Chronic kidney disease, stage 3 unspecified: Secondary | ICD-10-CM | POA: Diagnosis present

## 2017-02-10 DIAGNOSIS — Z823 Family history of stroke: Secondary | ICD-10-CM

## 2017-02-10 DIAGNOSIS — R200 Anesthesia of skin: Secondary | ICD-10-CM | POA: Diagnosis not present

## 2017-02-10 DIAGNOSIS — I272 Pulmonary hypertension, unspecified: Secondary | ICD-10-CM | POA: Diagnosis not present

## 2017-02-10 DIAGNOSIS — I10 Essential (primary) hypertension: Secondary | ICD-10-CM | POA: Diagnosis not present

## 2017-02-10 DIAGNOSIS — I360 Nonrheumatic tricuspid (valve) stenosis: Secondary | ICD-10-CM | POA: Diagnosis not present

## 2017-02-10 DIAGNOSIS — Z905 Acquired absence of kidney: Secondary | ICD-10-CM | POA: Diagnosis not present

## 2017-02-10 DIAGNOSIS — E785 Hyperlipidemia, unspecified: Secondary | ICD-10-CM | POA: Diagnosis not present

## 2017-02-10 DIAGNOSIS — R51 Headache: Secondary | ICD-10-CM

## 2017-02-10 DIAGNOSIS — Z7952 Long term (current) use of systemic steroids: Secondary | ICD-10-CM

## 2017-02-10 DIAGNOSIS — Z6829 Body mass index (BMI) 29.0-29.9, adult: Secondary | ICD-10-CM | POA: Diagnosis not present

## 2017-02-10 DIAGNOSIS — Z85528 Personal history of other malignant neoplasm of kidney: Secondary | ICD-10-CM | POA: Diagnosis not present

## 2017-02-10 DIAGNOSIS — I639 Cerebral infarction, unspecified: Secondary | ICD-10-CM

## 2017-02-10 DIAGNOSIS — I6312 Cerebral infarction due to embolism of basilar artery: Secondary | ICD-10-CM | POA: Diagnosis not present

## 2017-02-10 DIAGNOSIS — Z833 Family history of diabetes mellitus: Secondary | ICD-10-CM | POA: Diagnosis not present

## 2017-02-10 DIAGNOSIS — I517 Cardiomegaly: Secondary | ICD-10-CM | POA: Diagnosis not present

## 2017-02-10 DIAGNOSIS — R519 Headache, unspecified: Secondary | ICD-10-CM | POA: Diagnosis present

## 2017-02-10 DIAGNOSIS — I651 Occlusion and stenosis of basilar artery: Secondary | ICD-10-CM | POA: Diagnosis not present

## 2017-02-10 DIAGNOSIS — I6629 Occlusion and stenosis of unspecified posterior cerebral artery: Secondary | ICD-10-CM | POA: Diagnosis not present

## 2017-02-10 DIAGNOSIS — E7800 Pure hypercholesterolemia, unspecified: Secondary | ICD-10-CM | POA: Diagnosis not present

## 2017-02-10 LAB — BASIC METABOLIC PANEL
Anion gap: 11 (ref 5–15)
BUN: 18 mg/dL (ref 6–20)
CO2: 22 mmol/L (ref 22–32)
Calcium: 9.8 mg/dL (ref 8.9–10.3)
Chloride: 109 mmol/L (ref 101–111)
Creatinine, Ser: 1.48 mg/dL — ABNORMAL HIGH (ref 0.44–1.00)
GFR calc Af Amer: 40 mL/min — ABNORMAL LOW (ref >60)
GFR calc non Af Amer: 34 mL/min — ABNORMAL LOW (ref >60)
Glucose, Bld: 95 mg/dL (ref 65–99)
Potassium: 4.4 mmol/L (ref 3.5–5.1)
Sodium: 142 mmol/L (ref 135–145)

## 2017-02-10 LAB — PROTIME-INR
INR: 1.15
Prothrombin Time: 14.7 seconds (ref 11.4–15.2)

## 2017-02-10 LAB — HEPATIC FUNCTION PANEL
ALT: 20 U/L (ref 14–54)
AST: 20 U/L (ref 15–41)
Albumin: 3.9 g/dL (ref 3.5–5.0)
Alkaline Phosphatase: 85 U/L (ref 38–126)
Bilirubin, Direct: 0.3 mg/dL (ref 0.1–0.5)
Indirect Bilirubin: 1 mg/dL — ABNORMAL HIGH (ref 0.3–0.9)
Total Bilirubin: 1.3 mg/dL — ABNORMAL HIGH (ref 0.3–1.2)
Total Protein: 7.2 g/dL (ref 6.5–8.1)

## 2017-02-10 LAB — CBC WITH DIFFERENTIAL/PLATELET
Basophils Absolute: 0 10*3/uL (ref 0.0–0.1)
Basophils Relative: 0 %
Eosinophils Absolute: 0 10*3/uL (ref 0.0–0.7)
Eosinophils Relative: 0 %
HCT: 45 % (ref 36.0–46.0)
Hemoglobin: 14.9 g/dL (ref 12.0–15.0)
Lymphocytes Relative: 28 %
Lymphs Abs: 1.4 10*3/uL (ref 0.7–4.0)
MCH: 30.5 pg (ref 26.0–34.0)
MCHC: 33.1 g/dL (ref 30.0–36.0)
MCV: 92.2 fL (ref 78.0–100.0)
Monocytes Absolute: 0.3 10*3/uL (ref 0.1–1.0)
Monocytes Relative: 6 %
Neutro Abs: 3.3 10*3/uL (ref 1.7–7.7)
Neutrophils Relative %: 66 %
Platelets: 263 10*3/uL (ref 150–400)
RBC: 4.88 MIL/uL (ref 3.87–5.11)
RDW: 13.6 % (ref 11.5–15.5)
WBC: 4.9 10*3/uL (ref 4.0–10.5)

## 2017-02-10 LAB — LIPID PANEL
Cholesterol: 159 mg/dL (ref 0–200)
HDL: 58 mg/dL (ref >40)
LDL Cholesterol: 88 mg/dL (ref 0–99)
Total CHOL/HDL Ratio: 2.7 RATIO
Triglycerides: 66 mg/dL (ref <150)
VLDL: 13 mg/dL (ref 0–40)

## 2017-02-10 LAB — APTT: aPTT: 29 seconds (ref 24–36)

## 2017-02-10 MED ORDER — SODIUM CHLORIDE 0.9 % IV SOLN
INTRAVENOUS | Status: AC
  Administered 2017-02-10: via INTRAVENOUS

## 2017-02-10 MED ORDER — SENNOSIDES-DOCUSATE SODIUM 8.6-50 MG PO TABS: 1.0000 | ORAL_TABLET | Freq: Every evening | ORAL | Status: DC | PRN

## 2017-02-10 MED ORDER — ACETAMINOPHEN 325 MG PO TABS
650.0000 mg | ORAL_TABLET | ORAL | Status: DC | PRN
  Administered 2017-02-10 – 2017-02-12 (×7): 650 mg via ORAL
  Filled 2017-02-10 (×7): qty 2

## 2017-02-10 MED ORDER — ACETAMINOPHEN 650 MG RE SUPP
650.0000 mg | RECTAL | Status: DC | PRN
Start: 2017-02-10 — End: 2017-02-12

## 2017-02-10 MED ORDER — ACETAMINOPHEN 160 MG/5ML PO SOLN: 650.0000 mg | ORAL | Status: DC | PRN

## 2017-02-10 MED ORDER — DIPHENHYDRAMINE HCL 50 MG/ML IJ SOLN
12.5000 mg | Freq: Once | INTRAMUSCULAR | Status: AC
  Administered 2017-02-10: 12.5 mg via INTRAVENOUS
  Filled 2017-02-10: qty 1

## 2017-02-10 MED ORDER — ACETAMINOPHEN 325 MG PO TABS
650.0000 mg | ORAL_TABLET | Freq: Once | ORAL | Status: AC
  Administered 2017-02-10: 650 mg via ORAL
  Filled 2017-02-10: qty 2

## 2017-02-10 MED ORDER — ASPIRIN 325 MG PO TABS
325.0000 mg | ORAL_TABLET | Freq: Every day | ORAL | Status: DC
  Administered 2017-02-11 – 2017-02-12 (×2): 325 mg via ORAL
  Filled 2017-02-10 (×2): qty 1

## 2017-02-10 MED ORDER — METOCLOPRAMIDE HCL 5 MG/ML IJ SOLN
5.0000 mg | Freq: Once | INTRAMUSCULAR | Status: AC
  Administered 2017-02-10: 5 mg via INTRAVENOUS
  Filled 2017-02-10: qty 2

## 2017-02-10 MED ORDER — STROKE: EARLY STAGES OF RECOVERY BOOK
Freq: Once | Status: AC
  Administered 2017-02-10: 16:00:00
  Filled 2017-02-10: qty 1

## 2017-02-10 MED ORDER — SODIUM CHLORIDE 0.9 % IV SOLN: INTRAVENOUS | Status: DC

## 2017-02-10 MED ORDER — PRAVASTATIN SODIUM 40 MG PO TABS
80.0000 mg | ORAL_TABLET | Freq: Every day | ORAL | Status: DC
  Administered 2017-02-11 – 2017-02-12 (×2): 80 mg via ORAL
  Filled 2017-02-10 (×2): qty 2

## 2017-02-10 MED ORDER — ASPIRIN 300 MG RE SUPP: 300.0000 mg | Freq: Every day | RECTAL | Status: DC

## 2017-02-10 MED ORDER — HEPARIN (PORCINE) IN NACL 100-0.45 UNIT/ML-% IJ SOLN
1000.0000 [IU]/h | INTRAMUSCULAR | Status: DC
  Administered 2017-02-10: 850 [IU]/h via INTRAVENOUS
  Filled 2017-02-10 (×2): qty 250

## 2017-02-10 NOTE — Consult Note (Signed)
Requesting Physician: Dr. Aggie Moats    Chief Complaint: stroke  History obtained from:  Patient     HPI:                                                                                                                                         Katie [name] is an 71 y.o. female with past medical history of heart failure but does not have a history of alcoholism, drug use, and does not take aspirin on a daily basis. Patient does have hypertension however while in the emergency room she has not been significantly hypertensive. Patient states that she awoke this morning and she noted a headache which was full cranial over the top of her head. Patient does admit to neck pain over the neck pain is on the right side of her neck and does not radiate down into her shoulder into her head. 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 Neuro was consultative for this reason.  Date last known well: Date: 02/10/2017 Time last known well: Unable to determine tPA Given: No: no LSN   Past Medical History:  Diagnosis Date  . 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 History of kidney cancer    a. s/p right nephrectomy ~2007.  Marland Kitchen Hypercholesterolemia   . Hypertension   . Moderate mitral regurgitation 01/08/2016  . Moderate to severe pulmonary hypertension ([place]) 01/08/2016  . Moderate tricuspid regurgitation 01/08/2016    Past Surgical History:  Procedure Laterality Date  . CARDIAC CATHETERIZATION N/A 01/12/2016   Procedure: Right/Left Heart Cath and Coronary/Graft Angiography;  Surgeon: Belva Crome, MD;  Location: [facility];  Service: Cardiovascular;  Laterality: N/A;  . CERVICAL POLYPECTOMY N/A 04/23/2013   Procedure: CERVICAL POLYPECTOMY;  Surgeon: Maeola Sarah. Landry Mellow, MD;  Location: [facility];  Service:  Gynecology;  Laterality: N/A;  Possible Polypectomy  . HYSTEROSCOPY W/D&C N/A 04/23/2013   Procedure: DILATATION AND CURETTAGE /HYSTEROSCOPY;  Surgeon: Maeola Sarah. Landry Mellow, MD;  Location: [facility];  Service: Gynecology;  Laterality: N/A;  . KNEE ARTHROSCOPY Left   . NEPHRECTOMY Right    due to carcinoma    Family History  Problem Relation Age of Onset  . Stroke Brother   . Diabetes Mellitus II Sister    Social History:  reports that she has never smoked. She has never used smokeless tobacco. She reports that she does not drink alcohol or use drugs.  Allergies:  Allergies  Allergen Reactions  . Tramadol Shortness Of Breath, Palpitations and Other (See Comments)    Patient thinks that her recent symptoms of breathlessness may be related to taking this (??)    Medications:  Current Facility-Administered Medications  Medication Dose Route Frequency Provider Last Rate Last Dose  .  stroke: mapping our early stages of recovery book   Does not apply Once Black, Karen M, NP      . 0.9 %  sodium chloride infusion   Intravenous Continuous Black, Lezlie Octave, NP      . acetaminophen (TYLENOL) tablet 650 mg  650 mg Oral Q4H PRN Radene Gunning, NP       Or  . acetaminophen (TYLENOL) solution 650 mg  650 mg Per Tube Q4H PRN Radene Gunning, NP       Or  . acetaminophen (TYLENOL) suppository 650 mg  650 mg Rectal Q4H PRN Radene Gunning, NP      . aspirin suppository 300 mg  300 mg Rectal Daily Black, Lezlie Octave, NP       Or  . aspirin tablet 325 mg  325 mg Oral Daily Black, Karen M, NP      . pravastatin (PRAVACHOL) tablet 80 mg  80 mg Oral Daily Black, Karen M, NP      . senna-docusate (Senokot-S) tablet 1 tablet  1 tablet Oral QHS PRN Renard Hamper Lezlie Octave, NP       Current Outpatient Prescriptions  Medication Sig Dispense Refill  . aspirin EC 81 MG tablet Take 81 mg by mouth daily.    .  furosemide (LASIX) 20 MG tablet Take 10 mg by mouth daily.  0  . metoprolol tartrate (LOPRESSOR) 25 MG tablet Take 0.5 tablets (12.5 mg total) by mouth 2 (two) times daily. 30 tablet 8  . potassium chloride (MICRO-K) 10 MEQ CR capsule Take 2 capsules (20 mEq total) by mouth daily. 60 capsule 11  . pravastatin (PRAVACHOL) 80 MG tablet Take 80 mg by mouth daily.    . predniSONE (DELTASONE) 20 MG tablet Take 3 tablets (60 mg total) by mouth daily. 12 tablet 0     ROS:                                                                                                                                       History obtained from the patient  General ROS: negative for - chills, fatigue, fever, night sweats, weight gain or weight loss Psychological ROS: negative for - behavioral disorder, hallucinations, memory difficulties, mood swings or suicidal ideation Ophthalmic ROS: negative for - blurry vision, double vision, eye pain or loss of vision ENT ROS: negative for - epistaxis, nasal discharge, oral lesions, sore throat, tinnitus or vertigo Allergy and Immunology ROS: negative for - hives or itchy/watery eyes Hematological and Lymphatic ROS: negative for - bleeding problems, bruising or swollen lymph nodes Endocrine ROS: negative for - galactorrhea, hair pattern changes, polydipsia/polyuria or temperature intolerance Respiratory ROS: negative for - cough, hemoptysis, shortness of breath or wheezing Cardiovascular ROS: negative for - chest pain, dyspnea on exertion, edema or irregular heartbeat Gastrointestinal ROS:  negative for - abdominal pain, diarrhea, hematemesis, nausea/vomiting or stool incontinence Genito-Urinary ROS: negative for - dysuria, hematuria, incontinence or urinary frequency/urgency Musculoskeletal ROS: negative for - joint swelling or muscular weakness Neurological ROS: as noted in HPI Dermatological ROS: negative for rash and skin lesion changes  Neurologic Examination:                                                                                                       Blood pressure 116/74, pulse 82, temperature 97.8 F (36.6 C), resp. rate 16, last menstrual period 02/12/2013, SpO2 99 %.  HEENT-  Normocephalic, no lesions, without obvious abnormality.  Normal external eye and conjunctiva.  Normal TM's bilaterally.  Normal auditory canals and external ears. Normal external nose, mucus membranes and septum.  Normal pharynx. Cardiovascular- S1, S2 normal, pulses palpable throughout   Lungs- chest clear, no wheezing, rales, normal symmetric air entry Abdomen- soft, non-tender; bowel sounds normal; no masses,  no organomegaly Extremities- less then 2 second capillary refill Lymph-no adenopathy palpable Musculoskeletal-no joint tenderness, deformity or swelling, no muscular tenderness noted Skin-diaphoretic  Neurological Examination Mental Status: Alert, oriented, thought content appropriate.  Speech fluent without evidence of aphasia.  Able to follow 3 step commands without difficulty. Cranial Nerves: II:  Visual fields grossly normal,  III,IV, VI: ptosis not present, extra-ocular motions intact bilaterally, pupils equal, round, reactive to light and accommodation V,VII: smile symmetric, facial light touch sensation normal bilaterally VIII: hearing normal bilaterally IX,X: uvula rises symmetrically XI: bilateral shoulder shrug XII: midline tongue extension Motor: Right : Upper extremity   5/5    Left:     Upper extremity   5/5  Lower extremity   5/5     Lower extremity   5/5 Tone and bulk:normal tone throughout; no atrophy noted Sensory: Pinprick and light touch intact throughout, bilaterally Deep Tendon Reflexes: 2+ and symmetric throughout Plantars: Right: downgoing   Left: downgoing Cerebellar: normal finger-to-nose, normal rapid alternating movements and normal heel-to-shin test Gait: Not tested       Lab Results: Basic Metabolic Panel:  Recent  Labs Lab 02/10/17 0900  NA 142  K 4.4  CL 109  CO2 22  GLUCOSE 95  BUN 18  CREATININE 1.48*  CALCIUM 9.8    Liver Function Tests: No results for input(s): AST, ALT, ALKPHOS, BILITOT, PROT, ALBUMIN in the last 168 hours. No results for input(s): LIPASE, AMYLASE in the last 168 hours. No results for input(s): AMMONIA in the last 168 hours.  CBC:  Recent Labs Lab 02/10/17 0900  WBC 4.9  NEUTROABS 3.3  HGB 14.9  HCT 45.0  MCV 92.2  PLT 263    Cardiac Enzymes: No results for input(s): CKTOTAL, CKMB, CKMBINDEX, TROPONINI in the last 168 hours.  Lipid Panel: No results for input(s): CHOL, TRIG, HDL, CHOLHDL, VLDL, LDLCALC in the last 168 hours.  CBG: No results for input(s): GLUCAP in the last 168 hours.  Microbiology: Results for orders placed or performed during the hospital encounter of 01/07/16  MRSA PCR Screening     Status: None  Collection Time: 01/08/16  3:18 AM  Result Value Ref Range Status   MRSA by PCR NEGATIVE NEGATIVE Final    Comment:        The GeneXpert MRSA Assay (FDA approved for NASAL specimens only), is one component of a comprehensive MRSA colonization surveillance program. It is not intended to diagnose MRSA infection nor to guide or monitor treatment for MRSA infections.     Coagulation Studies: No results for input(s): LABPROT, INR in the last 72 hours.  Imaging: Ct Head Wo Contrast  Result Date: 02/10/2017 CLINICAL DATA:  Headache, dizziness, blurry vision. EXAM: CT HEAD WITHOUT CONTRAST TECHNIQUE: Contiguous axial images were obtained from the base of the skull through the vertex without intravenous contrast. COMPARISON:  CT scan of July 21, 2016. FINDINGS: Brain: Mild chronic ischemic white matter disease is noted. No mass effect or midline shift is noted. Ventricular size is within normal limits. There is interval development of ill-defined low density in the left cerebellar hemisphere concerning for infarction of  indeterminate age. No hemorrhage or mass lesion is noted. Vascular: No hyperdense vessel or unexpected calcification. Skull: Normal. Negative for fracture or focal lesion. Sinuses/Orbits: No acute finding. Other: None. IMPRESSION: Mild chronic ischemic white matter disease. Low density seen in superior portion of the left cerebellar hemisphere concerning for infarction of indeterminate age. MRI may be performed for further evaluation. Electronically Signed   By: Marijo Conception, M.D.   On: 02/10/2017 10:01   Mr Brain Wo Contrast (neuro Protocol)  Result Date: 02/10/2017 CLINICAL DATA:  Acute presentation with severe headache. EXAM: MRI HEAD WITHOUT CONTRAST TECHNIQUE: Multiplanar, multiecho pulse sequences of the brain and surrounding structures were obtained without intravenous contrast. COMPARISON:  CT same day. FINDINGS: Brain: 2 cm region of acute infarction demonstrated in the left cerebellum. Punctate focus of acute infarction in the right cerebellum. Cerebral hemispheres do not show any acute finding. The brainstem is normal. Cerebral hemispheres otherwise show minimal small vessel change of the white matter. No sign of neoplastic mass lesion. No hydrocephalus or extra-axial collection. Vascular: Major vessels at the base of the brain show flow. Skull and upper cervical spine: Negative Sinuses/Orbits: Clear/normal Other: None significant IMPRESSION: 2 cm acute infarction affecting the superior cerebellum on the left. Possible punctate acute infarction in the right cerebellum. Electronically Signed   By: Nelson Chimes M.D.   On: 02/10/2017 13:29       Assessment and plan discussed with with attending physician and they are in agreement.    Etta Quill PA-C [facility] [phone]  02/10/2017, 2:34 PM   Assessment: 71 y.o. female with subacute/acute left cerebellum infarct with no edema and no encroachment on the fourth ventricle. At this time patient would benefit from daily aspirin  and stroke workup. Exam is nonfocal at this time with no dysmetria/ataxia, dysarthria, abnormal extraocular movements.  Stroke Risk Factors - hyperlipidemia and hypertension  1. HgbA1c, fasting lipid panel 2. MRI, MRA  of the brain without contrast 3. PT consult, OT consult, Speech consult 4. Echocardiogram 5. Carotid dopplers 6. Prophylactic therapy-Antiplatelet med: Aspirin - dose 325 mg daily 7. Risk factor modification 8. Telemetry monitoring 9. Frequent neuro checks 10 NPO until passes stroke swallow screen 11 please page stroke NP  Or  PA  Or MD from 8am -4 pm  as this patient from this time will be  followed by the stroke.   You can look them up on [email or web address]    Neuro hospitalist addendum  Patient seen and examined. Please see history and physical above. Briefly, Ms. Suman is a 71 year old female with a possible history of heart failure, coronary medications, hypertension, who presented to the emergency room for evaluation of unremitting headaches. Her headaches had been going on for the last couple of days. She reports that nothing has relieved her headache. As a part of workup, CT of the head was done that showed left cerebellar hypodensity which was followed by an MRI to better characterize it and showed a left cerebellar area of acute stroke. After the patient was initially evaluated by the neurology team, we recommended getting an MRA done as a part of the stroke workup as well. 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  On examination, the patient is awake alert oriented 3, no dysarthria or aphasia. Follow commands. Cranial nerve exam reveals no gross abnormalities. Motor exam reveals no vertigo drift and antigravity 5/5 in all 4 extremities with normal tone and normal range of motion. Sensory exam reveals intact sensation all over. 2+ DTRs and downgoing plantars. Normal finger-nose-finger test bilaterally. Gait was not tested. NIH  stroke scale 0  Labs as above. Imaging data CT had revealed left cerebellar hypodensity. MRI of the brain without contrast revealed a left cerebellar area of restricted diffusion. MRA of the head and neck shows a fetal right PCA and thrombosis versus occlusion of the distal basilar with no left PCA and occluded versus thrombosed proximal right PCA.  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 Her examination is reassuring with an NIH stroke scale of 0.  Her abnormal and are a findings were discussed with Dr. Estanislado Pandy, IR interventionalist, who will perform diagnostic cerebral angio tomorrow.  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  Recommendations I would recommend starting the patient on heparin drip, no bolus, goal PTT 60-80 to be dosed per pharmacy. Patient should be nothing by mouth past midnight for diagnostic cerebral angiogram tomorrow. Stroke workup recommendations as above. These findings and the updated plan was discussed with Dr. Aggie Moats in person. Nothing by mouth order and heparin report are placed and confirmed with the nurse and the pharmacy. Please call with questions  Amie Portland, MD [facility] [phone]  If 7pm to 7am, please call on call as listed on AMION.

## 2017-02-10 NOTE — Progress Notes (Signed)
Pt received from ED. Pt stable, neuro intact. No noted distress. Telemetry monitoring. Pt oriented to room. Safety measures in place. Call bell within reach.  Will continue to monitor.

## 2017-02-10 NOTE — ED Triage Notes (Signed)
Pt reports she woke up w/ a pounding headache.  Pt denies n/v/d, weakness, SOB, vision changed or dizziness.  Pt is sensitive to light.

## 2017-02-10 NOTE — ED Notes (Signed)
Patient complaining of blurred vision. EDP aware

## 2017-02-10 NOTE — Progress Notes (Signed)
ANTICOAGULATION CONSULT NOTE - Initial Consult  Pharmacy Consult for heparin Indication: CVA  Allergies  Allergen Reactions  . Tramadol Shortness Of Breath, Palpitations and Other (See Comments)    Patient Measurements: Height: 5\' 2"  (157.5 cm) Weight: 163 lb 4.8 oz (74.1 kg) IBW/kg (Calculated) : 50.1 Heparin Dosing Weight: 66kg  Vital Signs: Temp: 97.9 F (36.6 C) (07/12 1609) Temp Source: Oral (07/12 1609) BP: 125/72 (07/12 1609) Pulse Rate: 77 (07/12 1609)  Labs:  Recent Labs  02/10/17 0900 02/10/17 1416  HGB 14.9  --   HCT 45.0  --   PLT 263  --   APTT  --  29  LABPROT  --  14.7  INR  --  1.15  CREATININE 1.48*  --     Estimated Creatinine Clearance: 32.9 mL/min (A) (by C-G formula based on SCr of 1.48 mg/dL (H)).   Medical History: Past Medical History:  Diagnosis Date  . 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 History of kidney cancer    a. s/p right nephrectomy ~2007.  Marland Kitchen Hypercholesterolemia   . Hypertension   . Moderate mitral regurgitation 01/08/2016  . Moderate to severe pulmonary hypertension ([place]) 01/08/2016  . Moderate tricuspid regurgitation 01/08/2016    Medications:  Prescriptions Prior to Admission  Medication Sig Dispense Refill Last Dose  . acetaminophen (TYLENOL) 325 MG tablet Take 325-650 mg by mouth every 6 (six) hours as needed (for pain or headaches).   PRN at PRN  . aspirin EC 81 MG tablet Take 81 mg by mouth every other day.    02/08/2017 at 1200  . furosemide (LASIX) 20 MG tablet Take 20 mg by mouth daily.   0 02/09/2017 at am  . latanoprost (XALATAN) 0.005 % ophthalmic solution Place 1 drop into both eyes at bedtime.  0 02/09/2017 at pm  . metoprolol tartrate (LOPRESSOR) 25 MG tablet Take 0.5 tablets (12.5 mg total) by mouth 2 (two) times daily. 30 tablet 8 02/09/2017 at 1800  . potassium chloride (MICRO-K) 10 MEQ CR capsule Take 2 capsules (20 mEq total) by mouth daily. 60 capsule 11  02/09/2017 at am  . pravastatin (PRAVACHOL) 80 MG tablet Take 80 mg by mouth at bedtime.    02/09/2017 at pm   Scheduled:  . aspirin  300 mg Rectal Daily   Or  . aspirin  325 mg Oral Daily  . 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 Pharmacy consulted to dose heparin (no bolus). Spoke with MD and may use standard low dose heparin protocol (heparin level goal 0.3-0.5). No anticoagulants noted PTA.   Goal of Therapy:  Heparin level 0.3-0.5 units/ml Monitor platelets by anticoagulation protocol: Yes   Plan:  -Begin heparin at 850 units/hr  -Heparin level in 8 hours and daily wth CBC daily  Hildred Laser, Pharm D 02/10/2017 6:00 PM

## 2017-02-10 NOTE — H&P (Signed)
History and Physical    GREY SCHLAUCH OIZ:[id number] DOB: [birth year] DOA: 02/10/2017  PCP: Seward Carol, MD Patient coming from: home  Chief Complaint: headache  HPI: Katie [name] is a pleasant 71 y.o. female with medical history significant hypertension, hyperlipidemia, chronic kidney disease, pulmonary hypertension, tricuspid regurg, history of kidney cancer status post right nephrectomy 2007 presents to the emergency Department chief complaint of headache. Initial evaluation includes an MRI of the brain showing an acute infarct.  Formation is obtained from the patient. She states she was in her usual state of health until around 4 AM she awakened with a very bad headache. She described it as throbbing located throughout her head radiating down her neck. He denies dizziness syncope or near-syncope. She denies chest pain palpitation shortness of breath. She denies a numbness tingling of her extremities. She denies any weakness of her extremities. She denies any difficulty chewing or swallowing. She denies any slurred speech. She denies abdominal pain nausea vomiting diarrhea constipation melena. She denies dysuria hematuria frequency or urgency. She denies any fever chills recent travel or sick contacts. She came to the emergency department. During her time in the emergency department she developed some blurred vision and dizziness. Episode lasted only a few minutes and has subsided. She states she's had headaches in the past but this was the most severe.    ED Course: In the emergency department she's afebrile hemodynamically stable and not hypoxic.  Review of Systems: As per HPI otherwise all other systems reviewed and are negative.   Ambulatory Status: She ambulates independently with a steady gait she denies any recent falls. She is independent with ADLs  Past Medical History:  Diagnosis Date  . 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  History of kidney cancer    a. s/p right nephrectomy ~2007.  Marland Kitchen Hypercholesterolemia   . Hypertension   . Moderate mitral regurgitation 01/08/2016  . Moderate to severe pulmonary hypertension ([place]) 01/08/2016  . Moderate tricuspid regurgitation 01/08/2016    Past Surgical History:  Procedure Laterality Date  . CARDIAC CATHETERIZATION N/A 01/12/2016   Procedure: Right/Left Heart Cath and Coronary/Graft Angiography;  Surgeon: Belva Crome, MD;  Location: [facility];  Service: Cardiovascular;  Laterality: N/A;  . CERVICAL POLYPECTOMY N/A 04/23/2013   Procedure: CERVICAL POLYPECTOMY;  Surgeon: Maeola Sarah. Landry Mellow, MD;  Location: [facility];  Service: Gynecology;  Laterality: N/A;  Possible Polypectomy  . HYSTEROSCOPY W/D&C N/A 04/23/2013   Procedure: DILATATION AND CURETTAGE /HYSTEROSCOPY;  Surgeon: Maeola Sarah. Landry Mellow, MD;  Location: [facility];  Service: Gynecology;  Laterality: N/A;  . KNEE ARTHROSCOPY Left   . NEPHRECTOMY Right    due to carcinoma    Social History   Social History  . Marital status: Divorced    Spouse name: N/A  . Number of children: N/A  . Years of education: N/A   Occupational History  . Not on file.   Social History Main Topics  . Smoking status: Never Smoker  . Smokeless tobacco: Never Used  . Alcohol use No  . Drug use: No  . Sexual activity: Not on file   Other Topics Concern  . Not on file   Social History Narrative  . No narrative on file    Allergies  Allergen Reactions  . Tramadol Shortness Of Breath, Palpitations and Other (See Comments)    Patient thinks that her recent symptoms of breathlessness may be related  to taking this (??)    Family History  Problem Relation Age of Onset  . Stroke Brother   . Diabetes Mellitus II Sister     Prior to Admission medications   Medication Sig Start Date End Date Taking? Authorizing Provider  aspirin EC 81 MG tablet Take 81 mg by mouth daily.    [provider]  furosemide (LASIX) 20 MG tablet Take 10 mg  by mouth daily. 09/09/16   [provider]  metoprolol tartrate (LOPRESSOR) 25 MG tablet Take 0.5 tablets (12.5 mg total) by mouth 2 (two) times daily. 01/24/17   Arbutus Leas, NP  potassium chloride (MICRO-K) 10 MEQ CR capsule Take 2 capsules (20 mEq total) by mouth daily. 07/30/16   Belva Crome, MD  pravastatin (PRAVACHOL) 80 MG tablet Take 80 mg by mouth daily.    [provider]  predniSONE (DELTASONE) 20 MG tablet Take 3 tablets (60 mg total) by mouth daily. 08/21/16   Everlene Balls, MD    Physical Exam: Vitals:   02/10/17 1145 02/10/17 1343 02/10/17 1345 02/10/17 1400  BP: 104/61 113/67 110/70 116/74  Pulse: 82 73 71 82  Resp: 16 16 16 16   Temp:      TempSrc:      SpO2: 99% 100% 97% 99%     General:  Appears calm and comfortable, in no acute distress Eyes:  PERRL, EOMI, normal lids, iris ENT:  grossly normal hearing, lips & tongue, his membranes of her mouth are pink slightly dry, poor dentition Neck:  no LAD, masses or thyromegaly Cardiovascular:  RRR, no m/r/g. No LE edema. Pedal pulses present and palpable Respiratory:  CTA bilaterally, no w/r/r. Normal respiratory effort. Abdomen:  soft, ntnd, positive bowel sounds throughout no guarding or rebounding Skin:  no rash or induration seen on limited exam Musculoskeletal:  grossly normal tone BUE/BLE, good ROM, no bony abnormality Psychiatric:  grossly normal mood and affect, speech fluent and appropriate, AOx3 Neurologic:  CN 2-12 grossly intact, moves all extremities in coordinated fashion, sensation intact, clear facial symmetry tongue midline bilateral grip 5 out of 5 lower extremity strength 5 out of 5 bilaterally  Labs on Admission: I have personally reviewed following labs and imaging studies  CBC:  Recent Labs Lab 02/10/17 0900  WBC 4.9  NEUTROABS 3.3  HGB 14.9  HCT 45.0  MCV 92.2  PLT 035   Basic Metabolic Panel:  Recent Labs Lab 02/10/17 0900  NA 142  K 4.4  CL 109  CO2 22  GLUCOSE  95  BUN 18  CREATININE 1.48*  CALCIUM 9.8   GFR: CrCl cannot be calculated (Unknown ideal weight.). Liver Function Tests: No results for input(s): AST, ALT, ALKPHOS, BILITOT, PROT, ALBUMIN in the last 168 hours. No results for input(s): LIPASE, AMYLASE in the last 168 hours. No results for input(s): AMMONIA in the last 168 hours. Coagulation Profile: No results for input(s): INR, PROTIME in the last 168 hours. Cardiac Enzymes: No results for input(s): CKTOTAL, CKMB, CKMBINDEX, TROPONINI in the last 168 hours. BNP (last 3 results) No results for input(s): PROBNP in the last 8760 hours. HbA1C: No results for input(s): HGBA1C in the last 72 hours. CBG: No results for input(s): GLUCAP in the last 168 hours. Lipid Profile: No results for input(s): CHOL, HDL, LDLCALC, TRIG, CHOLHDL, LDLDIRECT in the last 72 hours. Thyroid Function Tests: No results for input(s): TSH, T4TOTAL, FREET4, T3FREE, THYROIDAB in the last 72 hours. Anemia Panel: No results for input(s): VITAMINB12, FOLATE,  FERRITIN, TIBC, IRON, RETICCTPCT in the last 72 hours. Urine analysis:    Component Value Date/Time   COLORURINE YELLOW 06/27/2009 1452   APPEARANCEUR CLOUDY (A) 06/27/2009 1452   LABSPEC 1.013 06/27/2009 1452   PHURINE 6.5 06/27/2009 1452   GLUCOSEU NEGATIVE 06/27/2009 1452   HGBUR SMALL (A) 06/27/2009 1452   BILIRUBINUR NEGATIVE 06/27/2009 1452   KETONESUR NEGATIVE 06/27/2009 1452   PROTEINUR NEGATIVE 06/27/2009 1452   UROBILINOGEN 0.2 06/27/2009 1452   NITRITE NEGATIVE 06/27/2009 1452   LEUKOCYTESUR MODERATE (A) 06/27/2009 1452    Creatinine Clearance: CrCl cannot be calculated (Unknown ideal weight.).  Sepsis Labs: @LABRCNTIP (procalcitonin:4,lacticidven:4) )No results found for this or any previous visit (from the past 240 hour(s)).   Radiological Exams on Admission: Ct Head Wo Contrast  Result Date: 02/10/2017 CLINICAL DATA:  Headache, dizziness, blurry vision. EXAM: CT HEAD WITHOUT  CONTRAST TECHNIQUE: Contiguous axial images were obtained from the base of the skull through the vertex without intravenous contrast. COMPARISON:  CT scan of July 21, 2016. FINDINGS: Brain: Mild chronic ischemic white matter disease is noted. No mass effect or midline shift is noted. Ventricular size is within normal limits. There is interval development of ill-defined low density in the left cerebellar hemisphere concerning for infarction of indeterminate age. No hemorrhage or mass lesion is noted. Vascular: No hyperdense vessel or unexpected calcification. Skull: Normal. Negative for fracture or focal lesion. Sinuses/Orbits: No acute finding. Other: None. IMPRESSION: Mild chronic ischemic white matter disease. Low density seen in superior portion of the left cerebellar hemisphere concerning for infarction of indeterminate age. MRI may be performed for further evaluation. Electronically Signed   By: Marijo Conception, M.D.   On: 02/10/2017 10:01   Mr Brain Wo Contrast (neuro Protocol)  Result Date: 02/10/2017 CLINICAL DATA:  Acute presentation with severe headache. EXAM: MRI HEAD WITHOUT CONTRAST TECHNIQUE: Multiplanar, multiecho pulse sequences of the brain and surrounding structures were obtained without intravenous contrast. COMPARISON:  CT same day. FINDINGS: Brain: 2 cm region of acute infarction demonstrated in the left cerebellum. Punctate focus of acute infarction in the right cerebellum. Cerebral hemispheres do not show any acute finding. The brainstem is normal. Cerebral hemispheres otherwise show minimal small vessel change of the white matter. No sign of neoplastic mass lesion. No hydrocephalus or extra-axial collection. Vascular: Major vessels at the base of the brain show flow. Skull and upper cervical spine: Negative Sinuses/Orbits: Clear/normal Other: None significant IMPRESSION: 2 cm acute infarction affecting the superior cerebellum on the left. Possible punctate acute infarction in the  right cerebellum. Electronically Signed   By: Nelson Chimes M.D.   On: 02/10/2017 13:29    EKG: Independently reviewed. Sinus rhythm Right axis deviation Low voltage, extremity and precordial leads Anteroseptal infarct, old Minimal ST depression, inferior leads  Assessment/Plan Principal Problem:   Stroke [facility]) Active Problems:   Mild hypertension   CKD (chronic kidney disease), stage III   Hyperlipidemia   Chronic diastolic CHF (congestive heart failure) (HCC)   Headache   #1. Stroke. MRI reveals 2 cm acute infarction affecting the superior cerebellum on the left. Possible punctate acute infarction in the right cerebellum. ET of the head as noted above. EKG without acute abnormalities. Neuro exam without focal deficits. Emergency department physician notified neurology who will see patient in consultation. -Admit to telemetry -Obtain MRA carotid Dopplers 2-D echo -Continue aspirin and statin -Speech therapy occupational therapy consults -Passes swallow eval so we'll provide heart healthy diet  #2. Hypertension. Blood pressure on the  low end of normal in the emergency department. Home medications include metoprolol, Lasix -We'll hold these for now -Monitor blood pressure closely -Resume antihypertensive as indicated  3. Chronic kidney disease stage III. Creatinine 1.4. This appears to be close to baseline -Gentle IV fluids -Hold nephrotoxins -Monitor urine output  #4. Chronic diastolic heart failure. Home medications include Lasix. Echo last year reveals mild concentric hypertrophy EF of 60% and grade 3 diastolic dysfunction. -2-D echo as noted above -Daily weights -Intake and output -holding Lasix for now as noted above    DVT prophylaxis: scd Code Status: full  Family Communication: none present  Disposition Plan: home  Consults called: neuro  Admission status: inpatient    Radene Gunning MD [facility]  If 7PM-7AM, please contact  night-coverage [email or web address] Password [email or web address]  02/10/2017, 2:28 PM

## 2017-02-10 NOTE — ED Notes (Signed)
Patient transported to CT 

## 2017-02-10 NOTE — ED Provider Notes (Signed)
[facility] DEPT Provider Note   CSN: [id number] Arrival date & time: 02/10/17  4854     History   Chief Complaint Chief Complaint  Patient presents with  . Headache    HPI Katie [name] is a 71 y.o. female.  The history is provided by the patient. No language interpreter was used.  Headache     Katie [name] is a 71 y.o. female who presents to the Emergency Department complaining of HA.  She was in her routine since health when she awoke just before 5 AM this morning with a diffuse, throbbing headache. Headache is located throughout the front and posterior scalp as well as down her neck. She has associated eye pain bilaterally. She denies any photophobia, fever, nausea, vomiting, chest pain, bowel pain, numbness, weakness. She has had prior similar headaches in the past but this is the most severe headache she has experienced. No recent tick bites. She takes no blood thinners and her only medical problem is heart failure. Past Medical History:  Diagnosis Date  . 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 History of kidney cancer    a. s/p right nephrectomy ~2007.  Marland Kitchen Hypercholesterolemia   . Hypertension   . Moderate mitral regurgitation 01/08/2016  . Moderate to severe pulmonary hypertension ([place]) 01/08/2016  . Moderate tricuspid regurgitation 01/08/2016    Patient Active Problem List   Diagnosis Date Noted  . Chronic diastolic CHF (congestive heart failure) ([place]) 05/04/2016  . Chest pain   . Moderate mitral regurgitation 01/08/2016  . Moderate tricuspid regurgitation 01/08/2016  . Moderate to severe pulmonary hypertension ([place]) 01/08/2016  . Acute CHF (congestive heart failure) ([place]) 01/07/2016  . Elevated troponin 01/07/2016  . Mild hypertension 01/07/2016  . CKD (chronic kidney disease), stage III 01/07/2016  . Hyperlipidemia 01/07/2016  . Acute CHF ([place]) 01/07/2016    Past Surgical History:  Procedure Laterality Date  .  CARDIAC CATHETERIZATION N/A 01/12/2016   Procedure: Right/Left Heart Cath and Coronary/Graft Angiography;  Surgeon: Belva Crome, MD;  Location: [facility];  Service: Cardiovascular;  Laterality: N/A;  . CERVICAL POLYPECTOMY N/A 04/23/2013   Procedure: CERVICAL POLYPECTOMY;  Surgeon: Maeola Sarah. Landry Mellow, MD;  Location: [facility];  Service: Gynecology;  Laterality: N/A;  Possible Polypectomy  . HYSTEROSCOPY W/D&C N/A 04/23/2013   Procedure: DILATATION AND CURETTAGE /HYSTEROSCOPY;  Surgeon: Maeola Sarah. Landry Mellow, MD;  Location: [facility];  Service: Gynecology;  Laterality: N/A;  . KNEE ARTHROSCOPY Left   . NEPHRECTOMY Right    due to carcinoma    OB History    No data available       Home Medications    Prior to Admission medications   Medication Sig Start Date End Date Taking? Authorizing Provider  aspirin EC 81 MG tablet Take 81 mg by mouth daily.    [provider]  furosemide (LASIX) 20 MG tablet Take 10 mg by mouth daily. 09/09/16   [provider]  metoprolol tartrate (LOPRESSOR) 25 MG tablet Take 0.5 tablets (12.5 mg total) by mouth 2 (two) times daily. 01/24/17   Arbutus Leas, NP  potassium chloride (MICRO-K) 10 MEQ CR capsule Take 2 capsules (20 mEq total) by mouth daily. 07/30/16   Belva Crome, MD  pravastatin (PRAVACHOL) 80 MG tablet Take 80 mg by mouth daily.    [provider]  predniSONE (DELTASONE) 20 MG tablet Take 3 tablets (60 mg total) by mouth  daily. 08/21/16   Everlene Balls, MD    Family History Family History  Problem Relation Age of Onset  . Stroke Brother   . Diabetes Mellitus II Sister     Social History Social History  Substance Use Topics  . Smoking status: Never Smoker  . Smokeless tobacco: Never Used  . Alcohol use No     Allergies   Tramadol   Review of Systems Review of Systems  Neurological: Positive for headaches.  All other systems reviewed and are negative.    Physical Exam Updated Vital Signs BP 136/86 (BP Location:  Left Arm)   Pulse 91   Temp 98 F (36.7 C) (Oral)   LMP 02/12/2013   SpO2 98%   Physical Exam  Constitutional: She is oriented to person, place, and time. She appears well-developed and well-nourished.  HENT:  Head: Normocephalic and atraumatic.  Right Ear: External ear normal.  Left Ear: External ear normal.  Nose: Nose normal.  Mouth/Throat: Oropharynx is clear and moist.  Eyes: Pupils are equal, round, and reactive to light. Conjunctivae and EOM are normal.  Neck: Neck supple.  Cardiovascular: Normal rate and regular rhythm.   No murmur heard. Pulmonary/Chest: Effort normal and breath sounds normal. No respiratory distress.  Abdominal: Soft. There is no tenderness. There is no rebound and no guarding.  Musculoskeletal: She exhibits no edema or tenderness.  Neurological: She is alert and oriented to person, place, and time. No cranial nerve deficit. Coordination normal.  5 out of 5 strength in all 4 extremities with sensation to light touch intact in all 4 extremities. No pronator drift. Visual fields grossly intact.  Skin: Skin is warm and dry.  Psychiatric: She has a normal mood and affect. Her behavior is normal.  Nursing note and vitals reviewed.    ED Treatments / Results  Labs (all labs ordered are listed, but only abnormal results are displayed) Labs Reviewed - No data to display  EKG  EKG Interpretation None       Radiology No results found.  Procedures Procedures (including critical care time)  Medications Ordered in ED Medications  metoCLOPramide (REGLAN) injection 5 mg (not administered)  diphenhydrAMINE (BENADRYL) injection 12.5 mg (not administered)     Initial Impression / Assessment and Plan / ED Course  I have reviewed the triage vital signs and the nursing notes.  Pertinent labs & imaging results that were available during my care of the patient were reviewed by me and considered in my medical decision making (see chart for  details).  Clinical Course as of Feb 10 1418  Thu Feb 10, 2017  1022 Pt reports ongoing but partially improved headache.  She states that following initial exam she developed blurred vision as well as numbness in her left lower face.  Those symptoms have since resolved and she has a nonfocal neuro exam.   [ER]    Clinical Course User Index [ER] Quintella Reichert, MD    Patient here for evaluation of headache. Normal neurologic examination department with NIH stroke score of 0. Providing Benadryl and Reglan as well as Tylenol for symptomatic relief. CT head obtained given new onset headache. CT scan with possible infarct, MRI ordered.  Patient is not a TPA candidate given an age stroke scale.  MRI concerning for acute cerebellar infarct. Repeat assessment patient with nonfocal neurologic examination. Discussed with neuro hospitalist who will see the patient in consult. Hospitalist consulted for admission for further treatment. Patient updated findings of studies and recommendation for  treatment and she is agreement with plan.  Final Clinical Impressions(s) / ED Diagnoses   Final diagnoses:  Acute CVA (cerebrovascular accident) [facility])    New Prescriptions New Prescriptions   No medications on file     Quintella Reichert, MD 02/10/17 1421

## 2017-02-10 NOTE — ED Notes (Signed)
Patient transported to MRI 

## 2017-02-10 NOTE — ED Notes (Signed)
This RN attempted IV stick x 2 unsuccessfully

## 2017-02-10 NOTE — ED Notes (Signed)
Attempted report x1. 

## 2017-02-11 ENCOUNTER — Encounter (HOSPITAL_COMMUNITY): Payer: Self-pay

## 2017-02-11 ENCOUNTER — Inpatient Hospital Stay (HOSPITAL_COMMUNITY): Payer: PPO

## 2017-02-11 DIAGNOSIS — I651 Occlusion and stenosis of basilar artery: Secondary | ICD-10-CM

## 2017-02-11 HISTORY — PX: IR ANGIO VERTEBRAL SEL VERTEBRAL BILAT MOD SED: IMG5369

## 2017-02-11 HISTORY — PX: IR ANGIO INTRA EXTRACRAN SEL COM CAROTID INNOMINATE BILAT MOD SED: IMG5360

## 2017-02-11 LAB — CBC
HCT: 39.3 % (ref 36.0–46.0)
Hemoglobin: 12.8 g/dL (ref 12.0–15.0)
MCH: 30.3 pg (ref 26.0–34.0)
MCHC: 32.6 g/dL (ref 30.0–36.0)
MCV: 93.1 fL (ref 78.0–100.0)
Platelets: 228 10*3/uL (ref 150–400)
RBC: 4.22 MIL/uL (ref 3.87–5.11)
RDW: 13.7 % (ref 11.5–15.5)
WBC: 4.2 10*3/uL (ref 4.0–10.5)

## 2017-02-11 LAB — HEPARIN LEVEL (UNFRACTIONATED)
Heparin Unfractionated: 0.14 IU/mL — ABNORMAL LOW (ref 0.30–0.70)
Heparin Unfractionated: 0.21 IU/mL — ABNORMAL LOW (ref 0.30–0.70)

## 2017-02-11 LAB — HEMOGLOBIN A1C
Hgb A1c MFr Bld: 5.6 % (ref 4.8–5.6)
Mean Plasma Glucose: 114 mg/dL

## 2017-02-11 MED ORDER — LIDOCAINE HCL (PF) 1 % IJ SOLN
INTRAMUSCULAR | Status: AC
  Filled 2017-02-11: qty 30

## 2017-02-11 MED ORDER — SODIUM CHLORIDE 0.9 % IV SOLN
INTRAVENOUS | Status: AC
  Administered 2017-02-11: 17:00:00 via INTRAVENOUS

## 2017-02-11 MED ORDER — HEPARIN (PORCINE) IN NACL 100-0.45 UNIT/ML-% IJ SOLN
1000.0000 [IU]/h | INTRAMUSCULAR | Status: DC
  Filled 2017-02-11: qty 250

## 2017-02-11 MED ORDER — HEPARIN SODIUM (PORCINE) 1000 UNIT/ML IJ SOLN
INTRAMUSCULAR | Status: AC | PRN
  Administered 2017-02-11: 1000 [IU] via INTRAVENOUS

## 2017-02-11 MED ORDER — FENTANYL CITRATE (PF) 100 MCG/2ML IJ SOLN
INTRAMUSCULAR | Status: AC | PRN
  Administered 2017-02-11: 25 ug via INTRAVENOUS

## 2017-02-11 MED ORDER — MIDAZOLAM HCL 2 MG/2ML IJ SOLN
INTRAMUSCULAR | Status: AC
  Filled 2017-02-11: qty 2

## 2017-02-11 MED ORDER — IOPAMIDOL (ISOVUE-300) INJECTION 61%
INTRAVENOUS | Status: AC
  Administered 2017-02-11: 60 mL
  Filled 2017-02-11: qty 150

## 2017-02-11 MED ORDER — CLOPIDOGREL BISULFATE 75 MG PO TABS
75.0000 mg | ORAL_TABLET | Freq: Every day | ORAL | Status: DC
  Administered 2017-02-11 – 2017-02-12 (×2): 75 mg via ORAL
  Filled 2017-02-11 (×2): qty 1

## 2017-02-11 MED ORDER — FENTANYL CITRATE (PF) 100 MCG/2ML IJ SOLN
INTRAMUSCULAR | Status: AC
  Filled 2017-02-11: qty 2

## 2017-02-11 MED ORDER — HEPARIN SODIUM (PORCINE) 1000 UNIT/ML IJ SOLN
INTRAMUSCULAR | Status: AC
  Filled 2017-02-11: qty 2

## 2017-02-11 MED ORDER — LIDOCAINE HCL (PF) 1 % IJ SOLN
INTRAMUSCULAR | Status: AC | PRN
  Administered 2017-02-11: 10 mL

## 2017-02-11 MED ORDER — MIDAZOLAM HCL 2 MG/2ML IJ SOLN
INTRAMUSCULAR | Status: AC | PRN
  Administered 2017-02-11: 1 mg via INTRAVENOUS

## 2017-02-11 NOTE — Progress Notes (Signed)
Pt returned from angiogram with dressing noted to right groin dry and intact. Pt denies pain or discomfort. Pt flat bedrest at this time. Report received. Family at bedside. Call bell within reach. Will continue to monitor.

## 2017-02-11 NOTE — Progress Notes (Addendum)
[facility] PROGRESS NOTE  Katie [name] ZOX:[id number] DOB: 06/09/1946 DOA: 02/10/2017   PCP: Seward Carol, MD     Assessment/Plan: Principal Problem:   Stroke [facility]) Active Problems:   Mild hypertension   CKD (chronic kidney disease), stage III   Hyperlipidemia   Chronic diastolic CHF (congestive heart failure) ([place])   Headache   Basilar artery occlusion   71 y.o. female with medical history significant hypertension, hyperlipidemia, chronic kidney disease, pulmonary hypertension, tricuspid regurg, history of kidney cancer status post right nephrectomy 2007 presents to the emergency Department chief complaint of headache. Initial evaluation includes an MRI of the brain showing an acute infarct.MRI of the brain was obtained showing acute stroke in the left peripheral cerebellum. Neuro was consultative for this reason  Assessment and plan Acute cerebellar CVA Stroke Risk Factors - hyperlipidemia and hypertension 1. HgbA1c 5.6, fasting lipid panel-LDL 88, continue statin 2. MRI, MRA  of the brain -2 cm acute infarct, basilar artery embolic occlusion, IR to perform cerebral angiogram today 3. PT consult, OT consult, -outpatient PT/OT. Speech consult 4. Echocardiogram pending 5. Carotid dopplers pending 6. Continue: Aspirin - dose 325 mg daily 7. Telemetry monitoring-normal sinus rhythm 9. Frequent neuro checks-stable  10 heart healthy diet 11. Requested cardiology to arrange for ILR/TEE Monday or as outpatient     Hypertension. Blood pressure on the low end of normal in the emergency department. Home medications include metoprolol, Lasix on hold      Chronic kidney disease stage III. Creatinine 1.4. This appears to be close to baseline -Gentle IV fluids -Hold nephrotoxins     Chronic diastolic heart failure. Home medications include Lasix. Echo last year reveals mild concentric hypertrophy EF of 60% and grade 3 diastolic dysfunction. -2-D echo   pending -holding Lasix for now as noted above   DVT prophylaxsis heparin  Code Status:  Full code      Family Communication: Discussed in detail with the patient, all imaging results, lab results explained to the patient   Disposition Plan:   Pending further workup     Consultants: Neurology Cardiology Interventional radiology  Procedures: None    Antibiotics: Anti-infectives    None         HPI/Subjective: Headache seems to have Gotten worse overnight,  Objective: Vitals:   02/11/17 0400 02/11/17 0632 02/11/17 0814 02/11/17 0932  BP: 124/83 109/71 129/88 114/69  Pulse: 83 79 89 78  Resp: 18 18 18 19   Temp: 98.1 F (36.7 C) 98.4 F (36.9 C) 98.6 F (37 C) 98.2 F (36.8 C)  TempSrc: Oral Oral Oral Oral  SpO2: 99% 99% 100% 100%  Weight:      Height:        Intake/Output Summary (Last 24 hours) at 02/11/17 0948 Last data filed at 02/10/17 1759  Gross per 24 hour  Intake              120 ml  Output                0 ml  Net              120 ml    Exam:  Examination:  General exam: Appears calm and comfortable  Respiratory system: Clear to auscultation. Respiratory effort normal. Cardiovascular system: S1 & S2 heard, RRR. No JVD, murmurs, rubs, gallops or clicks. No pedal edema. Gastrointestinal system: Abdomen is nondistended, soft and nontender. No organomegaly or masses felt. Normal bowel sounds heard. Central  nervous system: Alert and oriented. No focal neurological deficits. Extremities: Symmetric 5 x 5 power. Skin: No rashes, lesions or ulcers Psychiatry: Judgement and insight appear normal. Mood & affect appropriate.     Data Reviewed: I have personally reviewed following labs and imaging studies  Micro Results No results found for this or any previous visit (from the past 240 hour(s)).  Radiology Reports Ct Head Wo Contrast  Result Date: 02/10/2017 CLINICAL DATA:  Headache, dizziness, blurry vision. EXAM: CT HEAD WITHOUT CONTRAST  TECHNIQUE: Contiguous axial images were obtained from the base of the skull through the vertex without intravenous contrast. COMPARISON:  CT scan of July 21, 2016. FINDINGS: Brain: Mild chronic ischemic white matter disease is noted. No mass effect or midline shift is noted. Ventricular size is within normal limits. There is interval development of ill-defined low density in the left cerebellar hemisphere concerning for infarction of indeterminate age. No hemorrhage or mass lesion is noted. Vascular: No hyperdense vessel or unexpected calcification. Skull: Normal. Negative for fracture or focal lesion. Sinuses/Orbits: No acute finding. Other: None. IMPRESSION: Mild chronic ischemic white matter disease. Low density seen in superior portion of the left cerebellar hemisphere concerning for infarction of indeterminate age. MRI may be performed for further evaluation. Electronically Signed   By: Marijo Conception, M.D.   On: 02/10/2017 10:01   Mr Angiogram Head Wo Contrast  Result Date: 02/10/2017 CLINICAL DATA:  Acute presentation with headache. Acute superior cerebellar infarction. EXAM: MRA HEAD WITHOUT CONTRAST TECHNIQUE: Angiographic images of the Circle of Willis were obtained using MRA technique without intravenous contrast. COMPARISON:  Earlier same day FINDINGS: Both internal carotid arteries are widely patent through the skullbase and siphon regions. The anterior and middle cerebral vessels are patent without proximal stenosis, aneurysm or vascular malformation. Both vertebral arteries are widely patent to the basilar. No proximal basilar stenosis. There is an absence of flow at the distal basilar artery. There is a large posterior communicating artery on the right supplying the right posterior cerebral artery. Some flow is present in the left PCA territory, though less pronounced. There is probably a small posterior communicating artery on the left. There is probably some flow in the right superior  cerebellar artery. No flow is seen in the left superior cerebellar artery. IMPRESSION: No anterior circulation abnormal finding. Absence of flow at the distal basilar most consistent with embolic occlusion. Patent posterior communicating artery on the right gives supply to the right posterior cerebral artery. Smaller posterior communicating artery on the left supplies the left posterior cerebral artery. I think there is probably some flow in the right superior cerebellar artery but no demonstrated flow in the left superior cerebellar artery. Electronically Signed   By: Nelson Chimes M.D.   On: 02/10/2017 15:56   Mr Brain Wo Contrast (neuro Protocol)  Result Date: 02/10/2017 CLINICAL DATA:  Acute presentation with severe headache. EXAM: MRI HEAD WITHOUT CONTRAST TECHNIQUE: Multiplanar, multiecho pulse sequences of the brain and surrounding structures were obtained without intravenous contrast. COMPARISON:  CT same day. FINDINGS: Brain: 2 cm region of acute infarction demonstrated in the left cerebellum. Punctate focus of acute infarction in the right cerebellum. Cerebral hemispheres do not show any acute finding. The brainstem is normal. Cerebral hemispheres otherwise show minimal small vessel change of the white matter. No sign of neoplastic mass lesion. No hydrocephalus or extra-axial collection. Vascular: Major vessels at the base of the brain show flow. Skull and upper cervical spine: Negative Sinuses/Orbits: Clear/normal Other: None significant  IMPRESSION: 2 cm acute infarction affecting the superior cerebellum on the left. Possible punctate acute infarction in the right cerebellum. Electronically Signed   By: Nelson Chimes M.D.   On: 02/10/2017 13:29   Dg Chest Port 1 View  Result Date: 02/10/2017 CLINICAL DATA:  Headache with photophobia EXAM: PORTABLE CHEST 1 VIEW COMPARISON:  August 10, 2016 FINDINGS: There is no edema or consolidation. Heart is mildly enlarged with pulmonary vascularity within normal  limits. No adenopathy. There is degenerative change in the thoracic spine. IMPRESSION: Cardiomegaly.  No edema or consolidation. Electronically Signed   By: Lowella Grip III M.D.   On: 02/10/2017 14:43     CBC  Recent Labs Lab 02/10/17 0900 02/11/17 0349  WBC 4.9 4.2  HGB 14.9 12.8  HCT 45.0 39.3  PLT 263 228  MCV 92.2 93.1  MCH 30.5 30.3  MCHC 33.1 32.6  RDW 13.6 13.7  LYMPHSABS 1.4  --   MONOABS 0.3  --   EOSABS 0.0  --   BASOSABS 0.0  --     Chemistries   Recent Labs Lab 02/10/17 0900 02/10/17 1416  NA 142  --   K 4.4  --   CL 109  --   CO2 22  --   GLUCOSE 95  --   BUN 18  --   CREATININE 1.48*  --   CALCIUM 9.8  --   AST  --  20  ALT  --  20  ALKPHOS  --  85  BILITOT  --  1.3*   ------------------------------------------------------------------------------------------------------------------ estimated creatinine clearance is 32.9 mL/min (A) (by C-G formula based on SCr of 1.48 mg/dL (H)). ------------------------------------------------------------------------------------------------------------------  Recent Labs  02/10/17 0900  HGBA1C 5.6   ------------------------------------------------------------------------------------------------------------------  Recent Labs  02/10/17 0900  CHOL 159  HDL 58  LDLCALC 88  TRIG 66  CHOLHDL 2.7   ------------------------------------------------------------------------------------------------------------------ No results for input(s): TSH, T4TOTAL, T3FREE, THYROIDAB in the last 72 hours.  Invalid input(s): FREET3 ------------------------------------------------------------------------------------------------------------------ No results for input(s): VITAMINB12, FOLATE, FERRITIN, TIBC, IRON, RETICCTPCT in the last 72 hours.  Coagulation profile  Recent Labs Lab 02/10/17 1416  INR 1.15    No results for input(s): DDIMER in the last 72 hours.  Cardiac Enzymes No results for input(s): CKMB,  TROPONINI, MYOGLOBIN in the last 168 hours.  Invalid input(s): CK ------------------------------------------------------------------------------------------------------------------ Invalid input(s): POCBNP   CBG: No results for input(s): GLUCAP in the last 168 hours.     Studies: Ct Head Wo Contrast  Result Date: 02/10/2017 CLINICAL DATA:  Headache, dizziness, blurry vision. EXAM: CT HEAD WITHOUT CONTRAST TECHNIQUE: Contiguous axial images were obtained from the base of the skull through the vertex without intravenous contrast. COMPARISON:  CT scan of July 21, 2016. FINDINGS: Brain: Mild chronic ischemic white matter disease is noted. No mass effect or midline shift is noted. Ventricular size is within normal limits. There is interval development of ill-defined low density in the left cerebellar hemisphere concerning for infarction of indeterminate age. No hemorrhage or mass lesion is noted. Vascular: No hyperdense vessel or unexpected calcification. Skull: Normal. Negative for fracture or focal lesion. Sinuses/Orbits: No acute finding. Other: None. IMPRESSION: Mild chronic ischemic white matter disease. Low density seen in superior portion of the left cerebellar hemisphere concerning for infarction of indeterminate age. MRI may be performed for further evaluation. Electronically Signed   By: Marijo Conception, M.D.   On: 02/10/2017 10:01   Mr Angiogram Head Wo Contrast  Result Date: 02/10/2017 CLINICAL DATA:  Acute  presentation with headache. Acute superior cerebellar infarction. EXAM: MRA HEAD WITHOUT CONTRAST TECHNIQUE: Angiographic images of the Circle of Willis were obtained using MRA technique without intravenous contrast. COMPARISON:  Earlier same day FINDINGS: Both internal carotid arteries are widely patent through the skullbase and siphon regions. The anterior and middle cerebral vessels are patent without proximal stenosis, aneurysm or vascular malformation. Both vertebral arteries  are widely patent to the basilar. No proximal basilar stenosis. There is an absence of flow at the distal basilar artery. There is a large posterior communicating artery on the right supplying the right posterior cerebral artery. Some flow is present in the left PCA territory, though less pronounced. There is probably a small posterior communicating artery on the left. There is probably some flow in the right superior cerebellar artery. No flow is seen in the left superior cerebellar artery. IMPRESSION: No anterior circulation abnormal finding. Absence of flow at the distal basilar most consistent with embolic occlusion. Patent posterior communicating artery on the right gives supply to the right posterior cerebral artery. Smaller posterior communicating artery on the left supplies the left posterior cerebral artery. I think there is probably some flow in the right superior cerebellar artery but no demonstrated flow in the left superior cerebellar artery. Electronically Signed   By: Nelson Chimes M.D.   On: 02/10/2017 15:56   Mr Brain Wo Contrast (neuro Protocol)  Result Date: 02/10/2017 CLINICAL DATA:  Acute presentation with severe headache. EXAM: MRI HEAD WITHOUT CONTRAST TECHNIQUE: Multiplanar, multiecho pulse sequences of the brain and surrounding structures were obtained without intravenous contrast. COMPARISON:  CT same day. FINDINGS: Brain: 2 cm region of acute infarction demonstrated in the left cerebellum. Punctate focus of acute infarction in the right cerebellum. Cerebral hemispheres do not show any acute finding. The brainstem is normal. Cerebral hemispheres otherwise show minimal small vessel change of the white matter. No sign of neoplastic mass lesion. No hydrocephalus or extra-axial collection. Vascular: Major vessels at the base of the brain show flow. Skull and upper cervical spine: Negative Sinuses/Orbits: Clear/normal Other: None significant IMPRESSION: 2 cm acute infarction affecting the  superior cerebellum on the left. Possible punctate acute infarction in the right cerebellum. Electronically Signed   By: Nelson Chimes M.D.   On: 02/10/2017 13:29   Dg Chest Port 1 View  Result Date: 02/10/2017 CLINICAL DATA:  Headache with photophobia EXAM: PORTABLE CHEST 1 VIEW COMPARISON:  August 10, 2016 FINDINGS: There is no edema or consolidation. Heart is mildly enlarged with pulmonary vascularity within normal limits. No adenopathy. There is degenerative change in the thoracic spine. IMPRESSION: Cardiomegaly.  No edema or consolidation. Electronically Signed   By: Lowella Grip III M.D.   On: 02/10/2017 14:43      Lab Results  Component Value Date   HGBA1C 5.6 02/10/2017   Lab Results  Component Value Date   LDLCALC 88 02/10/2017   CREATININE 1.48 (H) 02/10/2017       Scheduled Meds: . aspirin  300 mg Rectal Daily   Or  . aspirin  325 mg Oral Daily  . pravastatin  80 mg Oral Daily   Continuous Infusions: . sodium chloride 75 mL/hr at 02/10/17 2330  . heparin 1,000 Units/hr (02/11/17 0554)     LOS: 1 day    Time spent: >30 MINS    Reyne Dumas  [facility] Pager [phone]. If 7PM-7AM, please contact night-coverage at [email or web address], password [facility] 02/11/2017, 9:48 AM  LOS: 1 day

## 2017-02-11 NOTE — Progress Notes (Signed)
STROKE TEAM PROGRESS NOTE   HISTORY OF PRESENT ILLNESS (per record) Katie [name] is an 71 y.o. female with a history of heart failure, renal cancer s/p R nephrectomy 2007, stage III chronic kidney disease, hypertension, hyperlipidemia, moderate mitral regurgitation, moderate tricuspid regurgitation and pulmonary hypertension who presented with headache.  She was hypertensive in the ED and does not have a history of hypertension.  Patient states that she awoke this morning with a full cranial headache over the top of her head and right-sided neck pain.  CT head with L cerebellar hypodensities.  MRI with acute stroke in L peripheral cerebellum.  Date last known well: Date: 02/10/2017 Time last known well: Unable to determine  Patient was not administered IV t-PA secondary to no known LSN. She was admitted to General Neurology for further evaluation and treatment.   SUBJECTIVE (INTERVAL HISTORY) Her daughter and granddaughter are at the bedside.  The patient continues to have a headache.  She is scheduled for cerebral catheter angiogram later today   OBJECTIVE Temp:  [97.5 F (36.4 C)-98.6 F (37 C)] 98.6 F (37 C) (07/13 0814) Pulse Rate:  [71-89] 89 (07/13 0814) Cardiac Rhythm: Normal sinus rhythm (07/12 1902) Resp:  [16-25] 18 (07/13 0814) BP: (104-139)/(60-97) 129/88 (07/13 0814) SpO2:  [95 %-100 %] 100 % (07/13 0814) Weight:  [74.1 kg (163 lb 4.8 oz)] 74.1 kg (163 lb 4.8 oz) (07/12 1609)  CBC:  Recent Labs Lab 02/10/17 0900 02/11/17 0349  WBC 4.9 4.2  NEUTROABS 3.3  --   HGB 14.9 12.8  HCT 45.0 39.3  MCV 92.2 93.1  PLT 263 944    Basic Metabolic Panel:  Recent Labs Lab 02/10/17 0900  NA 142  K 4.4  CL 109  CO2 22  GLUCOSE 95  BUN 18  CREATININE 1.48*  CALCIUM 9.8    Lipid Panel:    Component Value Date/Time   CHOL 159 02/10/2017 0900   TRIG 66 02/10/2017 0900   HDL 58 02/10/2017 0900   CHOLHDL 2.7 02/10/2017 0900   VLDL 13 02/10/2017 0900   LDLCALC 88  02/10/2017 0900   HgbA1c:  Lab Results  Component Value Date   HGBA1C 5.6 02/10/2017   Urine Drug Screen: No results found for: LABOPIA, COCAINSCRNUR, LABBENZ, AMPHETMU, THCU, LABBARB  Alcohol Level No results found for: ETH  IMAGING  Ct Head Wo Contrast 02/10/2017 IMPRESSION: Mild chronic ischemic white matter disease. Low density seen in superior portion of the left cerebellar hemisphere concerning for infarction of indeterminate age. MRI may be performed for further evaluation.    Mr Angiogram Head Wo Contrast 02/10/2017 IMPRESSION: No anterior circulation abnormal finding. Absence of flow at the distal basilar most consistent with embolic occlusion. Patent posterior communicating artery on the right gives supply to the right posterior cerebral artery. Smaller posterior communicating artery on the left supplies the left posterior cerebral artery. I think there is probably some flow in the right superior cerebellar artery but no demonstrated flow in the left superior cerebellar artery.   Mr Brain Wo Contrast (neuro Protocol) 02/10/2017 IMPRESSION: 2 cm acute infarction affecting the superior cerebellum on the left. Possible punctate acute infarction in the right cerebellum.   Dg Chest Port 1 View  02/10/2017 IMPRESSION: Cardiomegaly.  No edema or consolidation.      PHYSICAL EXAM Pleasant elderly lady currently not in distress. . Afebrile. Head is nontraumatic. Neck is supple without bruit.    Cardiac exam no murmur or gallop. Lungs are clear to auscultation.  Distal pulses are well felt. Neurological Exam ;  Awake  Alert oriented x 3. Normal speech and language.eye movements full without nystagmus.fundi were not visualized. Vision acuity and fields appear normal. Hearing is normal. Palatal movements are normal. Face symmetric. Tongue midline. Normal strength, tone, reflexes and coordination. Normal sensation. Gait deferred.  ASSESSMENT/PLAN Katie [name] is a 71 y.o. female  with history ofheart failure, renal cancer s/p R nephrectomy 2007, stage III chronic kidney disease, hypertension, hyperlipidemia, moderate mitral regurgitation, moderate tricuspid regurgitation and pulmonary hypertension who presented with headache. She did not receive IV t-PA due to no known LSN.   Stroke: 2 cm acute infarction effecting the superior cerebellum on the left and possible punctate acute infarction in the right cerebellum, both likely embolic in the setting fetal-type posterior circulation with absent flow at distal basilar artery and left superior cerebellar artery   Resultant     CT head:  Mild chronic ischemic white matter disease. Low density seen in superior portion of the left cerebellar hemisphere concerning for infarction of indeterminate age.   MR brain WO contrast (neuro protocol): 2 cm acute infarction affecting the superior cerebellum on the left. Possible punctate acute infarction in the right cerebellum.   MRA head:  No anterior circulation abnormal finding. Absence of flow at the distal basilar most consistent with embolic occlusion. Patent posterior communicating artery on the right gives supply to the right posterior cerebral artery. Smaller posterior communicating artery on the left supplies the left posterior cerebral artery. I think there is probably some flow in the right superior cerebellar artery but no demonstrated flow in the left superior cerebellar artery.   Carotid Doppler   pending  2D Echo   *pending   LDL 88  HgbA1c 5.6  SCDs for VTE prophylaxis  Diet NPO time specified  aspirin 81 mg daily prior to admission, now on aspirin 325 mg daily  Patient counseled to be compliant with her antithrombotic medications  Ongoing aggressive stroke risk factor management  Therapy recommendations:   pending  Disposition:   pending  Hyperlipidemia  Home meds:  Pravastatin 80mg  PO daily, resumed in hospital  LDL 88 goal < 70  Continue statin at  discharge  Other Stroke Risk Factors  Advanced age  Overweight/borderline obese, Body mass index is 29.87 kg/m., recommend weight loss, diet and exercise as appropriate   Family hx stroke (Brother)  Heart failure  History of renal carcinoma   Other Active Problems  None  Hospital day # 1  I have personally examined this patient, reviewed notes, independently viewed imaging studies, participated in medical decision making and plan of care.ROS completed by me personally and pertinent positives fully documented  I have made any additions or clarifications directly to the above note.  She has presented with embolic left superior cerebellar infarct with terminal basilar artery and left superior cerebral artery occlusion likely of embolic etiology. Check cerebral catheter angiogram to further evaluate posterior circulation. May need workup for cardiac source of embolism later. Recommend dual antiplatelet therapy for 3 months. Continue ongoing stroke workup. Long discussion with the patient and daughter and answered questions about her stroke and workup. Discussed with Dr. Estanislado Pandy. Greater than 50% time during this 35 minute visit was spent on counseling and coordination of care about her stroke, discussing plan for evaluation, treatment and answered questions Antony Contras, MD Medical Director [place] Pager: [phone] 02/11/2017 4:26 PM   To contact Stroke Continuity provider, please refer to [email or web address]  After hours, contact General Neurology

## 2017-02-11 NOTE — Evaluation (Addendum)
Speech Language Pathology Evaluation Patient Details Name: Katie [name] MRN: [id number] DOB: 1946/06/11 Today's Date: 02/11/2017 Time: 3557-3220 SLP Time Calculation (min) (ACUTE ONLY): 15 min  Problem List:  Patient Active Problem List   Diagnosis Date Noted  . Basilar artery occlusion   . Stroke ([place]) 02/10/2017  . Headache 02/10/2017  . Chronic diastolic CHF (congestive heart failure) ([place]) 05/04/2016  . Chest pain   . Moderate mitral regurgitation 01/08/2016  . Moderate tricuspid regurgitation 01/08/2016  . Moderate to severe pulmonary hypertension ([place]) 01/08/2016  . Acute CHF (congestive heart failure) ([place]) 01/07/2016  . Elevated troponin 01/07/2016  . Mild hypertension 01/07/2016  . CKD (chronic kidney disease), stage III 01/07/2016  . Hyperlipidemia 01/07/2016  . Acute CHF ([place]) 01/07/2016   Past Medical History:  Past Medical History:  Diagnosis Date  . 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 History of kidney cancer    a. s/p right nephrectomy ~2007.  Marland Kitchen Hypercholesterolemia   . Hypertension   . Moderate mitral regurgitation 01/08/2016  . Moderate to severe pulmonary hypertension ([place]) 01/08/2016  . Moderate tricuspid regurgitation 01/08/2016   Past Surgical History:  Past Surgical History:  Procedure Laterality Date  . CARDIAC CATHETERIZATION N/A 01/12/2016   Procedure: Right/Left Heart Cath and Coronary/Graft Angiography;  Surgeon: Belva Crome, MD;  Location: [facility];  Service: Cardiovascular;  Laterality: N/A;  . CERVICAL POLYPECTOMY N/A 04/23/2013   Procedure: CERVICAL POLYPECTOMY;  Surgeon: Maeola Sarah. Landry Mellow, MD;  Location: [facility];  Service: Gynecology;  Laterality: N/A;  Possible Polypectomy  . HYSTEROSCOPY W/D&C N/A 04/23/2013   Procedure: DILATATION AND CURETTAGE /HYSTEROSCOPY;  Surgeon: Maeola Sarah. Landry Mellow, MD;  Location: [facility];  Service: Gynecology;  Laterality: N/A;  . KNEE ARTHROSCOPY Left   . NEPHRECTOMY  Right    due to carcinoma   HPI:  Pt is a 71 y/o female admitted secondary to throbbing headache. MRI revealed a subacute/acute L cerebellum infarct. PMH including but not limited to HTN, HLD and CKD.   Assessment / Plan / Recommendation Clinical Impression  Patient presents with cognitive-linguistic function which appears within functional limits. Patient and family report she is at her baseline level of function. No focal cranial nerve deficits or dysarthria noted. Pt does have low vocal amplitude which she reports is her baseline. Forde Dandy is appropriate with no receptive or expressive language deficits noted. Administered Mini Mental State Examination; pt scored 25/30, which is within normal limits. Errors in delayed recall (1/3), sequencing (3/5) and figure copy. During testing, there were frequent interruptions during which pt maintained selective attention to testing. Provided education re: sequelae of stroke; no skilled ST recommended at this time, but advised pt to consult SLP in OP should she note functional deficits upon return to home. SLP will s/o.    SLP Assessment  SLP Recommendation/Assessment: Patient does not need any further Speech Lanaguage Pathology Services SLP Visit Diagnosis: Cognitive communication deficit (R41.841)    Follow Up Recommendations  None;Other (comment) (OP SLP should she note functional deficits at return to home)    Frequency and Duration           SLP Evaluation Cognition  Overall Cognitive Status: Within Functional Limits for tasks assessed Arousal/Alertness: Awake/alert Orientation Level: Oriented X4 Attention: Selective;Sustained;Focused Focused Attention: Appears intact Sustained Attention: Appears intact Selective Attention: Appears intact Memory: Impaired Memory Impairment: Other (comment) (delayed recall 1/3) Awareness: Appears intact Problem Solving: Appears intact Executive  Function: Reasoning;Sequencing;Organizing;Decision  Making;Initiating Reasoning: Appears intact Sequencing: Appears intact Organizing: Appears intact Decision Making: Appears intact Initiating: Appears intact Safety/Judgment: Appears intact       Comprehension  Auditory Comprehension Overall Auditory Comprehension: Appears within functional limits for tasks assessed Visual Recognition/Discrimination Discrimination: Within Function Limits Reading Comprehension Reading Status: Not tested    Expression Expression Primary Mode of Expression: Verbal Verbal Expression Overall Verbal Expression: Appears within functional limits for tasks assessed Initiation: No impairment Level of Generative/Spontaneous Verbalization: Conversation Repetition: No impairment Naming: No impairment Pragmatics: No impairment Written Expression Dominant Hand: Left Written Expression: Not tested   Oral / Motor  Oral Motor/Sensory Function Overall Oral Motor/Sensory Function: Within functional limits Motor Speech Overall Motor Speech: Appears within functional limits for tasks assessed Respiration: Within functional limits Phonation: Normal;Low vocal intensity Resonance: Within functional limits Articulation: Within functional limitis Intelligibility: Intelligible Motor Planning: Witnin functional limits Motor Speech Errors: Not applicable   GO                    Aliene Altes 02/11/2017, 11:59 AM  Deneise Lever, [place], [place] Pathologist [phone]

## 2017-02-11 NOTE — Evaluation (Signed)
Physical Therapy Evaluation Patient Details Name: NADYNE GARIEPY MRN: [id number] DOB: 28-May-1946 Today's Date: 02/11/2017   History of Present Illness  Pt is a 71 y/o female admitted secondary to throbbing headache. MRI revealed a subacute/acute L cerebellum infarct. PMH including but not limited to HTN, HLD and CKD.  Clinical Impression  Pt presented supine in bed with HOB elevated, awake and willing to participate in therapy session. Prior to admission, pt reported that she is independent with all functional mobility and ADLs. Pt stated that she continues to drive as well. Pt limited this session secondary to reports of dizziness upon sitting and standing. In sitting, pt's dizziness resolved within several minutes; however, with standing pt reported no resolution of dizziness. Plan for further mobility assessment at next session.  At end of session, in sitting position: BP = 130/78 mmHg    Follow Up Recommendations Outpatient PT;Supervision/Assistance - 24 hour    Equipment Recommendations  Other (comment) (TBD with further mobility assessment)    Recommendations for Other Services       Precautions / Restrictions Precautions Precautions: Fall Restrictions Weight Bearing Restrictions: No      Mobility  Bed Mobility Overal bed mobility: Modified Independent                Transfers Overall transfer level: Needs assistance Equipment used: None Transfers: Sit to/from Stand Sit to Stand: Min guard         General transfer comment: min guard for safety, pt with reports of dizziness upon standing  Ambulation/Gait Ambulation/Gait assistance: Min guard Ambulation Distance (Feet): 2 Feet Assistive device: None   Gait velocity: decreased Gait velocity interpretation: Below normal speed for age/gender General Gait Details: pt side stepped from EOB to recliner chair; very limited secondary to reports of dizziness that did not resolve until pt was sitting down  Stairs            Wheelchair Mobility    Modified Rankin (Stroke Patients Only) Modified Rankin (Stroke Patients Only) Pre-Morbid Rankin Score: No symptoms Modified Rankin: Slight disability     Balance Overall balance assessment: Needs assistance Sitting-balance support: Feet supported Sitting balance-Leahy Scale: Fair Sitting balance - Comments: pt able to sit EOB with min guard to supervision. pt with reported dizziness upon sitting that took several minutes to resolve                                     Pertinent Vitals/Pain Pain Assessment: No/denies pain    Home Living Family/patient expects to be discharged to:: Private residence Living Arrangements: Alone Available Help at Discharge: Family;Available PRN/intermittently Type of Home: Apartment Home Access: Level entry     Home Layout: One level Home Equipment: None      Prior Function Level of Independence: Independent         Comments: continues to drive, drives her son to "therapy"     Hand Dominance   Dominant Hand: Left    Extremity/Trunk Assessment   Upper Extremity Assessment Upper Extremity Assessment: Defer to OT evaluation    Lower Extremity Assessment Lower Extremity Assessment: Overall WFL for tasks assessed       Communication   Communication: No difficulties  Cognition Arousal/Alertness: Awake/alert Behavior During Therapy: WFL for tasks assessed/performed Overall Cognitive Status: Within Functional Limits for tasks assessed  General Comments      Exercises     Assessment/Plan    PT Assessment Patient needs continued PT services  PT Problem List Decreased activity tolerance;Decreased balance;Decreased mobility;Decreased coordination;Decreased knowledge of use of DME;Decreased safety awareness;Decreased knowledge of precautions       PT Treatment Interventions DME instruction;Gait training;Stair  training;Functional mobility training;Therapeutic activities;Therapeutic exercise;Balance training;Neuromuscular re-education;Patient/family education    PT Goals (Current goals can be found in the Care Plan section)  Acute Rehab PT Goals Patient Stated Goal: return home PT Goal Formulation: With patient Time For Goal Achievement: 02/25/17 Potential to Achieve Goals: Good    Frequency Min 4X/week   Barriers to discharge        Co-evaluation               AM-PAC PT "6 Clicks" Daily Activity  Outcome Measure Difficulty turning over in bed (including adjusting bedclothes, sheets and blankets)?: None Difficulty moving from lying on back to sitting on the side of the bed? : None Difficulty sitting down on and standing up from a chair with arms (e.g., wheelchair, bedside commode, etc,.)?: A Little Help needed moving to and from a bed to chair (including a wheelchair)?: A Little Help needed walking in hospital room?: A Little Help needed climbing 3-5 steps with a railing? : A Lot 6 Click Score: 19    End of Session Equipment Utilized During Treatment: Gait belt Activity Tolerance: Other (comment) (pt limited secondary to reports of dizziness) Patient left: in chair;with call bell/phone within reach;with family/visitor present Nurse Communication: Mobility status PT Visit Diagnosis: Other abnormalities of gait and mobility (R26.89);Other symptoms and signs involving the nervous system (R29.898)    Time: 2395-3202 PT Time Calculation (min) (ACUTE ONLY): 17 min   Charges:   PT Evaluation $PT Eval Moderate Complexity: 1 Procedure     PT G Codes:        Sherie Don, PT, DPT [place] 02/11/2017, 9:19 AM

## 2017-02-11 NOTE — Care Management Note (Signed)
Case Management Note  Patient Details  Name: Katie [name] MRN: [id number] Date of Birth: [birth year]  Subjective/Objective:   Pt admitted with CVA. She is from home alone.                  Action/Plan: PT recommending outpatient therapy. Awaiting OT recs. CM following for d/c needs, physician orders.   Expected Discharge Date:                  Expected Discharge Plan:  OP Rehab  In-House Referral:     Discharge planning Services  CM Consult  Post Acute Care Choice:    Choice offered to:     DME Arranged:    DME Agency:     HH Arranged:    HH Agency:     Status of Service:  In process, will continue to follow  If discussed at Long Length of Stay Meetings, dates discussed:    Additional Comments:  Pollie Friar, RN 02/11/2017, 11:19 AM

## 2017-02-11 NOTE — Sedation Documentation (Signed)
Patient is resting comfortably. 

## 2017-02-11 NOTE — Consult Note (Signed)
Chief Complaint: Patient was seen in consultation today for  Cerebral arteriogram Chief Complaint  Patient presents with  . Headache   at the request of Dr Jerelyn Charles  Referring Physician(s): Dr Jerelyn Charles  Supervising Physician: Luanne Bras  Patient Status: [facility] - In-pt  History of Present Illness: Katie [name] is a 71 y.o. female   Hx R Nephrectomy 2007---cancer Hx CHF; HTN Awoke with headache 7/12; frontal cranial headache Still with headache now, but less Denies N/V No change in speech or vision (Blurred vision has resolved)  Work up : MRI IMPRESSION: 2 cm acute infarction affecting the superior cerebellum on the left. Possible punctate acute infarction in the right cerebellum. MRA IMPRESSION: No anterior circulation abnormal finding. Absence of flow at the distal basilar most consistent with embolic occlusion. Patent posterior communicating artery on the right gives supply to the right posterior cerebral artery. Smaller posterior communicating artery on the left supplies the left posterior cerebral artery. I think there is probably some flow in the right superior cerebellar artery but no demonstrated flow in the left superior cerebellar artery.  Request now for cerebral arteriogram  Past Medical History:  Diagnosis Date  . 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 History of kidney cancer    a. s/p right nephrectomy ~2007.  Marland Kitchen Hypercholesterolemia   . Hypertension   . Moderate mitral regurgitation 01/08/2016  . Moderate to severe pulmonary hypertension ([place]) 01/08/2016  . Moderate tricuspid regurgitation 01/08/2016    Past Surgical History:  Procedure Laterality Date  . CARDIAC CATHETERIZATION N/A 01/12/2016   Procedure: Right/Left Heart Cath and Coronary/Graft Angiography;  Surgeon: Belva Crome, MD;  Location: [facility];  Service: Cardiovascular;  Laterality: N/A;  . CERVICAL POLYPECTOMY N/A  04/23/2013   Procedure: CERVICAL POLYPECTOMY;  Surgeon: Maeola Sarah. Landry Mellow, MD;  Location: [facility];  Service: Gynecology;  Laterality: N/A;  Possible Polypectomy  . HYSTEROSCOPY W/D&C N/A 04/23/2013   Procedure: DILATATION AND CURETTAGE /HYSTEROSCOPY;  Surgeon: Maeola Sarah. Landry Mellow, MD;  Location: [facility];  Service: Gynecology;  Laterality: N/A;  . KNEE ARTHROSCOPY Left   . NEPHRECTOMY Right    due to carcinoma    Allergies: Tramadol  Medications: Prior to Admission medications   Medication Sig Start Date End Date Taking? Authorizing Provider  acetaminophen (TYLENOL) 325 MG tablet Take 325-650 mg by mouth every 6 (six) hours as needed (for pain or headaches).   Yes [provider]  aspirin EC 81 MG tablet Take 81 mg by mouth every other day.    Yes [provider]  furosemide (LASIX) 20 MG tablet Take 20 mg by mouth daily.  09/09/16  Yes [provider]  latanoprost (XALATAN) 0.005 % ophthalmic solution Place 1 drop into both eyes at bedtime. 02/01/17  Yes [provider]  metoprolol tartrate (LOPRESSOR) 25 MG tablet Take 0.5 tablets (12.5 mg total) by mouth 2 (two) times daily. 01/24/17  Yes Arbutus Leas, NP  potassium chloride (MICRO-K) 10 MEQ CR capsule Take 2 capsules (20 mEq total) by mouth daily. 07/30/16  Yes Belva Crome, MD  pravastatin (PRAVACHOL) 80 MG tablet Take 80 mg by mouth at bedtime.    Yes [provider]     Family History  Problem Relation Age of Onset  . Stroke Brother   . Diabetes Mellitus II Sister     Social History   Social History  . Marital  status: Divorced    Spouse name: N/A  . Number of children: N/A  . Years of education: N/A   Social History Main Topics  . Smoking status: Never Smoker  . Smokeless tobacco: Never Used  . Alcohol use No  . Drug use: No  . Sexual activity: Not Asked   Other Topics Concern  . None   Social History Narrative  . None    Review of Systems: A 12 point ROS discussed and pertinent  positives are indicated in the HPI above.  All other systems are negative.  Review of Systems  Constitutional: Positive for activity change. Negative for appetite change, fatigue and fever.  HENT: Negative for tinnitus, trouble swallowing and voice change.   Eyes: Positive for visual disturbance.  Respiratory: Negative for cough and shortness of breath.   Cardiovascular: Negative for chest pain.  Gastrointestinal: Negative for abdominal pain.  Musculoskeletal: Negative for gait problem.  Neurological: Positive for headaches. Negative for dizziness, tremors, seizures, syncope, facial asymmetry, speech difficulty, weakness, light-headedness and numbness.  Psychiatric/Behavioral: Negative for behavioral problems and confusion.    Vital Signs: BP 109/71 (BP Location: Left Arm)   Pulse 79   Temp 98.4 F (36.9 C) (Oral)   Resp 18   Ht 5\' 2"  (1.575 m)   Wt 163 lb 4.8 oz (74.1 kg)   LMP 02/12/2013   SpO2 99%   BMI 29.87 kg/m   Physical Exam  Constitutional: She is oriented to person, place, and time. She appears well-nourished.  HENT:  Head: Atraumatic.  Eyes: EOM are normal.  Neck: Neck supple.  Cardiovascular: Normal rate and regular rhythm.   Murmur heard. Pulmonary/Chest: Effort normal and breath sounds normal. She has no wheezes.  Abdominal: Soft. Bowel sounds are normal.  Musculoskeletal: Normal range of motion.  Neurological: She is alert and oriented to person, place, and time.  Skin: Skin is warm and dry.  Psychiatric: She has a normal mood and affect. Her behavior is normal. Judgment and thought content normal.  Nursing note and vitals reviewed.   Mallampati Score:  MD Evaluation Airway: WNL Heart: WNL Abdomen: WNL Chest/ Lungs: WNL ASA  Classification: 3 Mallampati/Airway Score: One  Imaging: Ct Head Wo Contrast  Result Date: 02/10/2017 CLINICAL DATA:  Headache, dizziness, blurry vision. EXAM: CT HEAD WITHOUT CONTRAST TECHNIQUE: Contiguous axial images were  obtained from the base of the skull through the vertex without intravenous contrast. COMPARISON:  CT scan of July 21, 2016. FINDINGS: Brain: Mild chronic ischemic white matter disease is noted. No mass effect or midline shift is noted. Ventricular size is within normal limits. There is interval development of ill-defined low density in the left cerebellar hemisphere concerning for infarction of indeterminate age. No hemorrhage or mass lesion is noted. Vascular: No hyperdense vessel or unexpected calcification. Skull: Normal. Negative for fracture or focal lesion. Sinuses/Orbits: No acute finding. Other: None. IMPRESSION: Mild chronic ischemic white matter disease. Low density seen in superior portion of the left cerebellar hemisphere concerning for infarction of indeterminate age. MRI may be performed for further evaluation. Electronically Signed   By: Marijo Conception, M.D.   On: 02/10/2017 10:01   Mr Angiogram Head Wo Contrast  Result Date: 02/10/2017 CLINICAL DATA:  Acute presentation with headache. Acute superior cerebellar infarction. EXAM: MRA HEAD WITHOUT CONTRAST TECHNIQUE: Angiographic images of the Circle of Willis were obtained using MRA technique without intravenous contrast. COMPARISON:  Earlier same day FINDINGS: Both internal carotid arteries are widely patent through the skullbase and  siphon regions. The anterior and middle cerebral vessels are patent without proximal stenosis, aneurysm or vascular malformation. Both vertebral arteries are widely patent to the basilar. No proximal basilar stenosis. There is an absence of flow at the distal basilar artery. There is a large posterior communicating artery on the right supplying the right posterior cerebral artery. Some flow is present in the left PCA territory, though less pronounced. There is probably a small posterior communicating artery on the left. There is probably some flow in the right superior cerebellar artery. No flow is seen in the  left superior cerebellar artery. IMPRESSION: No anterior circulation abnormal finding. Absence of flow at the distal basilar most consistent with embolic occlusion. Patent posterior communicating artery on the right gives supply to the right posterior cerebral artery. Smaller posterior communicating artery on the left supplies the left posterior cerebral artery. I think there is probably some flow in the right superior cerebellar artery but no demonstrated flow in the left superior cerebellar artery. Electronically Signed   By: Nelson Chimes M.D.   On: 02/10/2017 15:56   Mr Brain Wo Contrast (neuro Protocol)  Result Date: 02/10/2017 CLINICAL DATA:  Acute presentation with severe headache. EXAM: MRI HEAD WITHOUT CONTRAST TECHNIQUE: Multiplanar, multiecho pulse sequences of the brain and surrounding structures were obtained without intravenous contrast. COMPARISON:  CT same day. FINDINGS: Brain: 2 cm region of acute infarction demonstrated in the left cerebellum. Punctate focus of acute infarction in the right cerebellum. Cerebral hemispheres do not show any acute finding. The brainstem is normal. Cerebral hemispheres otherwise show minimal small vessel change of the white matter. No sign of neoplastic mass lesion. No hydrocephalus or extra-axial collection. Vascular: Major vessels at the base of the brain show flow. Skull and upper cervical spine: Negative Sinuses/Orbits: Clear/normal Other: None significant IMPRESSION: 2 cm acute infarction affecting the superior cerebellum on the left. Possible punctate acute infarction in the right cerebellum. Electronically Signed   By: Nelson Chimes M.D.   On: 02/10/2017 13:29   Dg Chest Port 1 View  Result Date: 02/10/2017 CLINICAL DATA:  Headache with photophobia EXAM: PORTABLE CHEST 1 VIEW COMPARISON:  August 10, 2016 FINDINGS: There is no edema or consolidation. Heart is mildly enlarged with pulmonary vascularity within normal limits. No adenopathy. There is  degenerative change in the thoracic spine. IMPRESSION: Cardiomegaly.  No edema or consolidation. Electronically Signed   By: Lowella Grip III M.D.   On: 02/10/2017 14:43    Labs:  CBC:  Recent Labs  08/20/16 2029 02/10/17 0900 02/11/17 0349  WBC 6.1 4.9 4.2  HGB 13.4 14.9 12.8  HCT 40.7 45.0 39.3  PLT 295 263 228    COAGS:  Recent Labs  02/10/17 1416  INR 1.15  APTT 29    BMP:  Recent Labs  05/03/16 0830 08/20/16 2029 02/10/17 0900  NA 143 138 142  K 4.2 4.1 4.4  CL 105 105 109  CO2 29 25 22   GLUCOSE 95 94 95  BUN 20 14 18   CALCIUM 9.8 9.5 9.8  CREATININE 1.48* 1.53* 1.48*  GFRNONAA  --  33* 34*  GFRAA  --  38* 40*    LIVER FUNCTION TESTS:  Recent Labs  02/10/17 1416  BILITOT 1.3*  AST 20  ALT 20  ALKPHOS 85  PROT 7.2  ALBUMIN 3.9    TUMOR MARKERS: No results for input(s): AFPTM, CEA, CA199, CHROMGRNA in the last 8760 hours.  Assessment and Plan:  Worsening headache New CVA on MR  Scheduled for cerebral arteriogram Risks and Benefits discussed with the patient including, but not limited to bleeding, infection, vascular injury, contrast induced renal failure, stroke or even death. All of the patient's questions were answered, patient is agreeable to proceed. Consent signed and in chart.  Thank you for this interesting consult.  I greatly enjoyed meeting Katie [name] and look forward to participating in their care.  A copy of this report was sent to the requesting provider on this date.  Electronically Signed: Lavonia Drafts, PA-C 02/11/2017, 8:06 AM   I spent a total of 40 Minutes    in face to face in clinical consultation, greater than 50% of which was counseling/coordinating care for cerebral arteriogram

## 2017-02-11 NOTE — Progress Notes (Signed)
ANTICOAGULATION CONSULT NOTE - Follow Up Consult  Pharmacy Consult for heparin Indication: CVA/cerebellar thrombus  Labs:  Recent Labs  02/10/17 0900 02/10/17 1416 02/11/17 0349  HGB 14.9  --  12.8  HCT 45.0  --  39.3  PLT 263  --  228  APTT  --  29  --   LABPROT  --  14.7  --   INR  --  1.15  --   HEPARINUNFRC  --   --  0.14*  CREATININE 1.48*  --   --     Assessment: 70yo female subtherapeutic on heparin with initial dosing for CVA/cerebellar thrombus.  Goal of Therapy:  Heparin level 0.3-0.5 units/ml   Plan:  Will increase heparin gtt by 2 units/kg/hr to 1000 units/hr and check level in [place], PharmD, BCPS  02/11/2017,5:46 AM

## 2017-02-11 NOTE — Progress Notes (Signed)
Initial Nutrition Assessment  DOCUMENTATION CODES:   Not applicable  INTERVENTION:   -Recommend Ensure Enlive po BID, each supplement provides 350 kcal and 20 grams of protein   NUTRITION DIAGNOSIS:   Increased nutrient needs related to acute illness as evidenced by estimated needs.  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 Pt with hx of stroke, CHF, HL, CKD, hx of kidney cancer s/p right nephrectomy   7/13 4 vessel cerebral arteriogram  Pt not in room on visit today. Recorded po intake 50% of meals.   Weight is trending down per wt encounters, 2,4% wt loss since March, 6.9% wt loss in 1 year. Wt loss not significant for time frame.   Labs: reviewed Meds: reviewed  Diet Order:  Diet Heart Room service appropriate? Yes; Fluid consistency: Thin  Skin:  Reviewed, no issues  Last BM:  02/09/17  Height:   Ht Readings from Last 1 Encounters:  02/10/17 5\' 2"  (1.575 m)    Weight:   Wt Readings from Last 1 Encounters:  02/10/17 163 lb 4.8 oz (74.1 kg)    Ideal Body Weight:     BMI:  Body mass index is 29.87 kg/m.  Estimated Nutritional Needs:   Kcal:  1645-1840 kcals  Protein:  82-92 g  Fluid:  >/= 1.7 L  EDUCATION NEEDS:   No education needs identified at this time  [place], [place], LDN [phone] Pager  [phone] Weekend/On-Call Pager

## 2017-02-11 NOTE — CV Procedure (Signed)
2D echo attempted, but patient in CT

## 2017-02-11 NOTE — Procedures (Signed)
S/P 4 vessel cerebral arteriogram. RT CFA approach. Findings. 1.Large filling defect distal basilar artery with near complete obstruction of  both superior cerebellar arteries and posterior cerebral arteries . 2.Bilaterally patent PCOMs

## 2017-02-11 NOTE — Progress Notes (Signed)
OT Cancellation Note  Patient Details Name: Katie [name] MRN: [id number] DOB: 1946/05/14   Cancelled Treatment:    Reason Eval/Treat Not Completed: Patient not medically ready. Pt returned from angiogram and is on bedrest per RN notes. OT will continue to follow for evaluation.   Merri Ray Clovia Reine 02/11/2017, 3:24 PM  Hulda Humphrey OTR/L [phone]

## 2017-02-12 ENCOUNTER — Encounter (HOSPITAL_COMMUNITY): Payer: PPO

## 2017-02-12 ENCOUNTER — Inpatient Hospital Stay (HOSPITAL_COMMUNITY): Payer: PPO

## 2017-02-12 ENCOUNTER — Encounter (HOSPITAL_COMMUNITY): Payer: Self-pay | Admitting: *Deleted

## 2017-02-12 DIAGNOSIS — I6312 Cerebral infarction due to embolism of basilar artery: Secondary | ICD-10-CM

## 2017-02-12 DIAGNOSIS — I360 Nonrheumatic tricuspid (valve) stenosis: Secondary | ICD-10-CM

## 2017-02-12 DIAGNOSIS — I639 Cerebral infarction, unspecified: Principal | ICD-10-CM

## 2017-02-12 LAB — COMPREHENSIVE METABOLIC PANEL
ALT: 14 U/L (ref 14–54)
AST: 16 U/L (ref 15–41)
Albumin: 3.2 g/dL — ABNORMAL LOW (ref 3.5–5.0)
Alkaline Phosphatase: 59 U/L (ref 38–126)
Anion gap: 6 (ref 5–15)
BUN: 15 mg/dL (ref 6–20)
CO2: 23 mmol/L (ref 22–32)
Calcium: 8.9 mg/dL (ref 8.9–10.3)
Chloride: 111 mmol/L (ref 101–111)
Creatinine, Ser: 1.23 mg/dL — ABNORMAL HIGH (ref 0.44–1.00)
GFR calc Af Amer: 50 mL/min — ABNORMAL LOW (ref >60)
GFR calc non Af Amer: 43 mL/min — ABNORMAL LOW (ref >60)
Glucose, Bld: 77 mg/dL (ref 65–99)
Potassium: 3.7 mmol/L (ref 3.5–5.1)
Sodium: 140 mmol/L (ref 135–145)
Total Bilirubin: 1.5 mg/dL — ABNORMAL HIGH (ref 0.3–1.2)
Total Protein: 6 g/dL — ABNORMAL LOW (ref 6.5–8.1)

## 2017-02-12 LAB — URINALYSIS, ROUTINE W REFLEX MICROSCOPIC
Bacteria, UA: NONE SEEN
Bilirubin Urine: NEGATIVE
Glucose, UA: NEGATIVE mg/dL
Ketones, ur: 20 mg/dL — AB
Nitrite: NEGATIVE
Protein, ur: NEGATIVE mg/dL
Specific Gravity, Urine: 1.03 (ref 1.005–1.030)
pH: 5 (ref 5.0–8.0)

## 2017-02-12 LAB — RAPID URINE DRUG SCREEN, HOSP PERFORMED
Amphetamines: NOT DETECTED
Barbiturates: NOT DETECTED
Benzodiazepines: POSITIVE — AB
Cocaine: NOT DETECTED
Opiates: NOT DETECTED
Tetrahydrocannabinol: NOT DETECTED

## 2017-02-12 MED ORDER — CLOPIDOGREL BISULFATE 75 MG PO TABS: 75.0000 mg | ORAL_TABLET | Freq: Every day | ORAL | 1 refills | Status: DC

## 2017-02-12 NOTE — Progress Notes (Signed)
Discharge orders received.  Discharge instructions and follow-up appointments reviewed with the patient and her daughter.  VSS upon discharge.  IV removed and education complete.  Transported out via wheelchair.   Cori Razor, RN

## 2017-02-12 NOTE — Progress Notes (Signed)
Physical Therapy Treatment Patient Details Name: Katie [name] MRN: [id number] DOB: 03/04/1946 Today's Date: 02/12/2017    History of Present Illness Pt is a 71 y/o female admitted secondary to throbbing headache. MRI revealed a subacute/acute L cerebellum infarct. PMH including but not limited to HTN, HLD and CKD.    PT Comments    Pt making good progress towards current functional goals. PT will continue to follow pt acutely to ensure a safe d/c home.   Follow Up Recommendations  Outpatient PT     Equipment Recommendations  None recommended by PT    Recommendations for Other Services       Precautions / Restrictions Precautions Precautions: Fall Restrictions Weight Bearing Restrictions: No    Mobility  Bed Mobility               General bed mobility comments: pt OOB in recliner chair upon arrival  Transfers Overall transfer level: Needs assistance Equipment used: None Transfers: Sit to/from Stand Sit to Stand: Supervision         General transfer comment: supervision for safety  Ambulation/Gait Ambulation/Gait assistance: Supervision Ambulation Distance (Feet): 150 Feet Assistive device: None Gait Pattern/deviations: Step-through pattern Gait velocity: decreased Gait velocity interpretation: Below normal speed for age/gender General Gait Details: mild instability but no overt LOB or need for physical assistance or AD   Stairs            Wheelchair Mobility    Modified Rankin (Stroke Patients Only) Modified Rankin (Stroke Patients Only) Pre-Morbid Rankin Score: No symptoms Modified Rankin: Slight disability     Balance Overall balance assessment: Needs assistance Sitting-balance support: Feet supported Sitting balance-Leahy Scale: Normal     Standing balance support: During functional activity;No upper extremity supported Standing balance-Leahy Scale: Fair                              Cognition Arousal/Alertness:  Awake/alert Behavior During Therapy: WFL for tasks assessed/performed Overall Cognitive Status: Within Functional Limits for tasks assessed                                        Exercises      General Comments        Pertinent Vitals/Pain Pain Assessment: 0-10 Pain Score: 7  Pain Location: headache Pain Descriptors / Indicators: Headache Pain Intervention(s): Monitored during session;Repositioned    Home Living                      Prior Function            PT Goals (current goals can now be found in the care plan section) Acute Rehab PT Goals PT Goal Formulation: With patient Time For Goal Achievement: 02/25/17 Potential to Achieve Goals: Good Progress towards PT goals: Progressing toward goals    Frequency    Min 4X/week      PT Plan Current plan remains appropriate    Co-evaluation              AM-PAC PT "6 Clicks" Daily Activity  Outcome Measure  Difficulty turning over in bed (including adjusting bedclothes, sheets and blankets)?: None Difficulty moving from lying on back to sitting on the side of the bed? : None Difficulty sitting down on and standing up from a chair with arms (e.g., wheelchair, bedside commode, etc,.)?: None  Help needed moving to and from a bed to chair (including a wheelchair)?: None Help needed walking in hospital room?: A Little Help needed climbing 3-5 steps with a railing? : A Little 6 Click Score: 22    End of Session Equipment Utilized During Treatment: Gait belt Activity Tolerance: Patient tolerated treatment well Patient left: in bed;with call bell/phone within reach;Other (comment) (sitting EOB) Nurse Communication: Mobility status PT Visit Diagnosis: Other abnormalities of gait and mobility (R26.89);Other symptoms and signs involving the nervous system (R29.898)     Time: 1040-1052 PT Time Calculation (min) (ACUTE ONLY): 12 min  Charges:  $Gait Training: 8-22 mins                     G Codes:       [place], [place], [place] 02/12/2017, 10:57 AM

## 2017-02-12 NOTE — Discharge Summary (Signed)
Physician Discharge Summary  Katie [name] CBJ:[id number] DOB: [birth year] DOA: 02/10/2017  PCP: Seward Carol, MD  Admit date: 02/10/2017 Discharge date: 02/12/2017  Admitted From: Home Disposition:  Home   Recommendations for Outpatient Follow-up:  1. Follow up with PCP in 1-2 weeks 2. Please obtain BMP/CBC in one week 3. Please follow up on the following pending results: Echocardiogram with cardiologists see below  Home Health: No Equipment/Devices: None  Discharge Condition: Stable CODE STATUS: Full Diet recommendation: Heart healthy   Brief/Interim Summary: 71 y.o.femalewith medical history significant hypertension, hyperlipidemia, chronic kidney disease, pulmonary hypertension, tricuspid regurg, history of kidney cancer status post right nephrectomy 2007 presents to the emergency Department chief complaint of headache. Initial evaluation includes an MRI of the brain showing an acute infarct.MRI of the brain was obtained showing acute stroke in the left peripheral cerebellum. Neuro was consultative for this reason and stroke workup ensued. Patient underwent interventional radiology angiogram and was found to have findings as below. Neurology recommended patient follow-up with her cardiologist for transesophageal echocardiogram. Transthoracic echocardiogram was obtained on the day of discharge and results are not available. She can get those from her cardiologist in 1 week.  The and implantable loop recorder. She is to follow-up in 6 weeks with Dr. Leonie Man she is also to follow-up in one week with her cardiologist Dr. Katharina Caper and her primary care doctor in 2 weeks Dr. polite  Discharge Diagnoses:  Principal Problem:   Stroke [facility]) Active Problems:   Mild hypertension   CKD (chronic kidney disease), stage III   Hyperlipidemia   Chronic diastolic CHF (congestive heart failure) ([place])   Headache   Basilar artery occlusion    Discharge Instructions  Discharge Instructions    Diet  - low sodium heart healthy    Complete by:  As directed    Increase activity slowly    Complete by:  As directed      Allergies as of 02/12/2017      Reactions   Tramadol Shortness Of Breath, Palpitations, Other (See Comments)      Medication List    TAKE these medications   acetaminophen 325 MG tablet Commonly known as:  TYLENOL Take 325-650 mg by mouth every 6 (six) hours as needed (for pain or headaches).   aspirin EC 81 MG tablet Take 81 mg by mouth every other day.   clopidogrel 75 MG tablet Commonly known as:  PLAVIX Take 1 tablet (75 mg total) by mouth daily.   furosemide 20 MG tablet Commonly known as:  LASIX Take 20 mg by mouth daily.   latanoprost 0.005 % ophthalmic solution Commonly known as:  XALATAN Place 1 drop into both eyes at bedtime.   metoprolol tartrate 25 MG tablet Commonly known as:  LOPRESSOR Take 0.5 tablets (12.5 mg total) by mouth 2 (two) times daily.   potassium chloride 10 MEQ CR capsule Commonly known as:  MICRO-K Take 2 capsules (20 mEq total) by mouth daily.   pravastatin 80 MG tablet Commonly known as:  PRAVACHOL Take 80 mg by mouth at bedtime.      Follow-up Information    Nahser, Wonda Cheng, MD Follow up in 1 week(s).   Specialty:  Cardiology Why:  Needs results of TTE and schduled for TEE and ILR Contact information: [place] [address] [phone]        Garvin Fila, MD Follow up in 6 week(s).   Specialties:  Neurology, Radiology Why:  CVA follow up  Contact information: [address] [place] [address] [phone]        Seward Carol, MD Follow up in 2 week(s).   Specialty:  Internal Medicine Contact information: [address] [place] [address] [phone]          Allergies  Allergen Reactions  . Tramadol Shortness Of Breath, Palpitations and Other (See Comments)    Consultations:  Neurology and interventional  neuroradiology   Procedures/Studies: Ct Head Wo Contrast  Result Date: 02/10/2017 CLINICAL DATA:  Headache, dizziness, blurry vision. EXAM: CT HEAD WITHOUT CONTRAST TECHNIQUE: Contiguous axial images were obtained from the base of the skull through the vertex without intravenous contrast. COMPARISON:  CT scan of July 21, 2016. FINDINGS: Brain: Mild chronic ischemic white matter disease is noted. No mass effect or midline shift is noted. Ventricular size is within normal limits. There is interval development of ill-defined low density in the left cerebellar hemisphere concerning for infarction of indeterminate age. No hemorrhage or mass lesion is noted. Vascular: No hyperdense vessel or unexpected calcification. Skull: Normal. Negative for fracture or focal lesion. Sinuses/Orbits: No acute finding. Other: None. IMPRESSION: Mild chronic ischemic white matter disease. Low density seen in superior portion of the left cerebellar hemisphere concerning for infarction of indeterminate age. MRI may be performed for further evaluation. Electronically Signed   By: Marijo Conception, M.D.   On: 02/10/2017 10:01   Mr Angiogram Head Wo Contrast  Result Date: 02/10/2017 CLINICAL DATA:  Acute presentation with headache. Acute superior cerebellar infarction. EXAM: MRA HEAD WITHOUT CONTRAST TECHNIQUE: Angiographic images of the Circle of Willis were obtained using MRA technique without intravenous contrast. COMPARISON:  Earlier same day FINDINGS: Both internal carotid arteries are widely patent through the skullbase and siphon regions. The anterior and middle cerebral vessels are patent without proximal stenosis, aneurysm or vascular malformation. Both vertebral arteries are widely patent to the basilar. No proximal basilar stenosis. There is an absence of flow at the distal basilar artery. There is a large posterior communicating artery on the right supplying the right posterior cerebral artery. Some flow is present in  the left PCA territory, though less pronounced. There is probably a small posterior communicating artery on the left. There is probably some flow in the right superior cerebellar artery. No flow is seen in the left superior cerebellar artery. IMPRESSION: No anterior circulation abnormal finding. Absence of flow at the distal basilar most consistent with embolic occlusion. Patent posterior communicating artery on the right gives supply to the right posterior cerebral artery. Smaller posterior communicating artery on the left supplies the left posterior cerebral artery. I think there is probably some flow in the right superior cerebellar artery but no demonstrated flow in the left superior cerebellar artery. Electronically Signed   By: Nelson Chimes M.D.   On: 02/10/2017 15:56   Mr Brain Wo Contrast (neuro Protocol)  Result Date: 02/10/2017 CLINICAL DATA:  Acute presentation with severe headache. EXAM: MRI HEAD WITHOUT CONTRAST TECHNIQUE: Multiplanar, multiecho pulse sequences of the brain and surrounding structures were obtained without intravenous contrast. COMPARISON:  CT same day. FINDINGS: Brain: 2 cm region of acute infarction demonstrated in the left cerebellum. Punctate focus of acute infarction in the right cerebellum. Cerebral hemispheres do not show any acute finding. The brainstem is normal. Cerebral hemispheres otherwise show minimal small vessel change of the white matter. No sign of neoplastic mass lesion. No hydrocephalus or extra-axial collection. Vascular: Major vessels at the base of the  brain show flow. Skull and upper cervical spine: Negative Sinuses/Orbits: Clear/normal Other: None significant IMPRESSION: 2 cm acute infarction affecting the superior cerebellum on the left. Possible punctate acute infarction in the right cerebellum. Electronically Signed   By: Nelson Chimes M.D.   On: 02/10/2017 13:29   Dg Chest Port 1 View  Result Date: 02/10/2017 CLINICAL DATA:  Headache with photophobia  EXAM: PORTABLE CHEST 1 VIEW COMPARISON:  August 10, 2016 FINDINGS: There is no edema or consolidation. Heart is mildly enlarged with pulmonary vascularity within normal limits. No adenopathy. There is degenerative change in the thoracic spine. IMPRESSION: Cardiomegaly.  No edema or consolidation. Electronically Signed   By: Lowella Grip III M.D.   On: 02/10/2017 14:43    Cerebral angiogram:1.Large filling defect distal basilar artery with near complete obstruction of  both superior cerebellar arteries and posterior cerebral arteries . 2.Bilaterally patent PCOMs    Subjective: Patient feeling better no new complaints.  Discharge Exam: Vitals:   02/12/17 1010 02/12/17 1411  BP: 125/74 123/75  Pulse: 86 90  Resp: 20 20  Temp: 98.2 F (36.8 C) 97.8 F (36.6 C)   Vitals:   02/12/17 0415 02/12/17 0555 02/12/17 1010 02/12/17 1411  BP: 131/85 120/73 125/74 123/75  Pulse: 90 86 86 90  Resp: 20 20 20 20   Temp:  97.6 F (36.4 C) 98.2 F (36.8 C) 97.8 F (36.6 C)  TempSrc:  Oral Oral Oral  SpO2: 100% 97% 100% 99%  Weight:      Height:        General: Pt is alert, awake, not in acute distress Cardiovascular: RRR, S1/S2 +, no rubs, no gallops Respiratory: CTA bilaterally, no wheezing, no rhonchi Abdominal: Soft, NT, ND, bowel sounds + Extremities: no edema, no cyanosis    The results of significant diagnostics from this hospitalization (including imaging, microbiology, ancillary and laboratory) are listed below for reference.     Microbiology: No results found for this or any previous visit (from the past 240 hour(s)).   Labs: BNP (last 3 results)  Recent Labs  08/20/16 2030  BNP 193.7*   Basic Metabolic Panel:  Recent Labs Lab 02/10/17 0900 02/12/17 0534  NA 142 140  K 4.4 3.7  CL 109 111  CO2 22 23  GLUCOSE 95 77  BUN 18 15  CREATININE 1.48* 1.23*  CALCIUM 9.8 8.9   Liver Function Tests:  Recent Labs Lab 02/10/17 1416 02/12/17 0534  AST 20 16   ALT 20 14  ALKPHOS 85 59  BILITOT 1.3* 1.5*  PROT 7.2 6.0*  ALBUMIN 3.9 3.2*   No results for input(s): LIPASE, AMYLASE in the last 168 hours. No results for input(s): AMMONIA in the last 168 hours. CBC:  Recent Labs Lab 02/10/17 0900 02/11/17 0349  WBC 4.9 4.2  NEUTROABS 3.3  --   HGB 14.9 12.8  HCT 45.0 39.3  MCV 92.2 93.1  PLT 263 228   Cardiac Enzymes: No results for input(s): CKTOTAL, CKMB, CKMBINDEX, TROPONINI in the last 168 hours. BNP: Invalid input(s): POCBNP CBG: No results for input(s): GLUCAP in the last 168 hours. D-Dimer No results for input(s): DDIMER in the last 72 hours. Hgb A1c  Recent Labs  02/10/17 0900  HGBA1C 5.6   Lipid Profile  Recent Labs  02/10/17 0900  CHOL 159  HDL 58  LDLCALC 88  TRIG 66  CHOLHDL 2.7   Thyroid function studies No results for input(s): TSH, T4TOTAL, T3FREE, THYROIDAB in the last 72 hours.  Invalid input(s): FREET3 Anemia work up No results for input(s): VITAMINB12, FOLATE, FERRITIN, TIBC, IRON, RETICCTPCT in the last 72 hours. Urinalysis    Component Value Date/Time   COLORURINE YELLOW 02/12/2017 0751   APPEARANCEUR CLEAR 02/12/2017 0751   LABSPEC 1.030 02/12/2017 0751   PHURINE 5.0 02/12/2017 0751   GLUCOSEU NEGATIVE 02/12/2017 0751   HGBUR SMALL (A) 02/12/2017 0751   BILIRUBINUR NEGATIVE 02/12/2017 0751   KETONESUR 20 (A) 02/12/2017 0751   PROTEINUR NEGATIVE 02/12/2017 0751   UROBILINOGEN 0.2 06/27/2009 1452   NITRITE NEGATIVE 02/12/2017 0751   LEUKOCYTESUR TRACE (A) 02/12/2017 0751   Sepsis Labs Invalid input(s): PROCALCITONIN,  WBC,  LACTICIDVEN Microbiology No results found for this or any previous visit (from the past 240 hour(s)).   Time coordinating discharge: Over 30 minutes  SIGNED:   Lady Deutscher, MD, FACP  [facility] 02/12/2017, 4:52 PM Pager   If 7PM-7AM, please contact night-coverage [email or web address] Password [email or web address]

## 2017-02-12 NOTE — Progress Notes (Signed)
Referral made through [facility] for outpatient PT. Patient will receive a call within a week to schedule first appointment. No other CM needs/ DME noted.

## 2017-02-12 NOTE — Progress Notes (Signed)
  Echocardiogram 2D Echocardiogram has been performed.  Lihanna Biever 02/12/2017, 4:31 PM

## 2017-02-12 NOTE — Progress Notes (Signed)
STROKE TEAM PROGRESS NOTE   SUBJECTIVE (INTERVAL HISTORY) Her daughter and granddaughter are at the bedside.  The patient continues to have a mild headache. DSA done showed basilar tip occlusion. Dr. Estanislado Pandy would like to repeat DSA in 2 months.    OBJECTIVE Temp:  [97.6 F (36.4 C)-99 F (37.2 C)] 97.6 F (36.4 C) (07/14 0555) Pulse Rate:  [78-97] 86 (07/14 0555) Cardiac Rhythm: Normal sinus rhythm (07/13 1900) Resp:  [11-23] 20 (07/14 0555) BP: (114-132)/(66-91) 120/73 (07/14 0555) SpO2:  [95 %-100 %] 97 % (07/14 0555)  CBC:   Recent Labs Lab 02/10/17 0900 02/11/17 0349  WBC 4.9 4.2  NEUTROABS 3.3  --   HGB 14.9 12.8  HCT 45.0 39.3  MCV 92.2 93.1  PLT 263 628    Basic Metabolic Panel:   Recent Labs Lab 02/10/17 0900 02/12/17 0534  NA 142 140  K 4.4 3.7  CL 109 111  CO2 22 23  GLUCOSE 95 77  BUN 18 15  CREATININE 1.48* 1.23*  CALCIUM 9.8 8.9    Lipid Panel:     Component Value Date/Time   CHOL 159 02/10/2017 0900   TRIG 66 02/10/2017 0900   HDL 58 02/10/2017 0900   CHOLHDL 2.7 02/10/2017 0900   VLDL 13 02/10/2017 0900   LDLCALC 88 02/10/2017 0900   HgbA1c:  Lab Results  Component Value Date   HGBA1C 5.6 02/10/2017   Urine Drug Screen: No results found for: LABOPIA, COCAINSCRNUR, LABBENZ, AMPHETMU, THCU, LABBARB  Alcohol Level No results found for: [place] I have personally reviewed the radiological images below and agree with the radiology interpretations.  Ct Head Wo Contrast 02/10/2017 IMPRESSION: Mild chronic ischemic white matter disease. Low density seen in superior portion of the left cerebellar hemisphere concerning for infarction of indeterminate age. MRI may be performed for further evaluation.    Mr Angiogram Head Wo Contrast 02/10/2017 IMPRESSION: No anterior circulation abnormal finding. Absence of flow at the distal basilar most consistent with embolic occlusion. Patent posterior communicating artery on the right gives  supply to the right posterior cerebral artery. Smaller posterior communicating artery on the left supplies the left posterior cerebral artery. I think there is probably some flow in the right superior cerebellar artery but no demonstrated flow in the left superior cerebellar artery.   Mr Brain Wo Contrast (neuro Protocol) 02/10/2017 IMPRESSION: 2 cm acute infarction affecting the superior cerebellum on the left. Possible punctate acute infarction in the right cerebellum.   Dg Chest Port 1 View  02/10/2017 IMPRESSION: Cardiomegaly.  No edema or consolidation.   DSA - 1.Large filling defect distal basilar artery with near complete obstruction of  both superior cerebellar arteries and posterior cerebral arteries . 2.Bilaterally patent PCOMs    PHYSICAL EXAM Pleasant elderly lady currently not in distress. . Afebrile. Head is nontraumatic. Neck is supple without bruit.    Cardiac exam no murmur or gallop. Lungs are clear to auscultation. Distal pulses are well felt. Neurological Exam ;  Awake  Alert oriented x 3. Normal speech and language.eye movements full without nystagmus.fundi were not visualized. Vision acuity and fields appear normal. Hearing is normal. Palatal movements are normal. Face symmetric. Tongue midline. Normal strength, tone, reflexes and coordination. Normal sensation. Gait deferred.  ASSESSMENT/PLAN Ms. CANDID BOVEY is a 71 y.o. female with history ofheart failure, renal cancer s/p R nephrectomy 2007, stage III chronic kidney disease, hypertension, hyperlipidemia, moderate mitral regurgitation, moderate tricuspid regurgitation and pulmonary hypertension who presented with headache.  She did not receive IV t-PA due to no known LSN.   Stroke: left SCA small infarction due to absent flow at distal BA and both SCAs and both PCAs, embolic pattern, etiology not clear   Resultant no deficit  CT head: Low density seen in superior portion of the left cerebellar hemisphere concerning for  infarction of indeterminate age.   MR brain - 2 cm acute infarction affecting the superior cerebellum on the left. Possible punctate acute infarction in the right cerebellum.   MRA head:  Absence of flow at the distal basilar most consistent with embolic occlusion. Patent posterior communicating artery on the right gives supply to the right posterior cerebral artery. Smaller posterior communicating artery on the left supplies the left posterior cerebral artery.   DSA - distal BA occlusion with occluded b/l SCAs and PCAs   2D Echo   pending  Recommend outpt TEE with cardiology and loop recorder if TEE unremarkable  LDL 88  HgbA1c 5.6  SCDs for VTE prophylaxis Diet Heart Room service appropriate? Yes; Fluid consistency: Thin  aspirin 81 mg daily prior to admission, now on aspirin 325 mg daily and plavix 75mg . Continue DAPT for 3 months and then plavix alone.   Patient counseled to be compliant with her antithrombotic medications  Ongoing aggressive stroke risk factor management  Therapy recommendations:  Home with supervision  Disposition:   Home  Basilar artery tip occlusion  Embolic pattern  SCA and PCA occlusion  On DAPT  Repeat neuro imaging in 2 months.  BP goal [id number].  Hyperlipidemia  Home meds:  Pravastatin 80mg  PO daily, resumed in hospital  LDL 88 goal < 70  Continue statin at discharge  Other Stroke Risk Factors  Advanced age  Family hx stroke (Brother)  Other Active Problems  History of renal [facility] day # 2  Neurology will sign off. Please call with questions. Pt will follow up with Dr. Leonie Man at [facility] in about 6 weeks. Thanks for the consult.  Rosalin Hawking, MD PhD Stroke Neurology 02/12/2017 11:47 PM     To contact Stroke Continuity provider, please refer to [email or web address] After hours, contact General Neurology

## 2017-02-12 NOTE — Evaluation (Signed)
Occupational Therapy Evaluation and Discharge Patient Details Name: Katie [name] MRN: [id number] DOB: January 15, 1946 Today's Date: 02/12/2017    History of Present Illness Pt is a 71 y/o female admitted secondary to throbbing headache. MRI revealed a subacute/acute L cerebellum infarct. PMH including but not limited to HTN, HLD and CKD.   Clinical Impression   Pt is very pleasant and willing to work with OT. PTA Pt independent in ADL and mobility. Pt currently requires increased time, but is at baseline at this time. Pt has excellent family support upon d/c. Pt with no questions or concerns for OT, education complete, and OT will sign off at this time.     Follow Up Recommendations  No OT follow up    Equipment Recommendations  None recommended by OT    Recommendations for Other Services       Precautions / Restrictions Precautions Precautions: Fall Restrictions Weight Bearing Restrictions: No      Mobility Bed Mobility               General bed mobility comments: pt OOB in recliner chair upon arrival  Transfers Overall transfer level: Needs assistance Equipment used: None Transfers: Sit to/from Stand Sit to Stand: Supervision         General transfer comment: supervision for safety    Balance Overall balance assessment: Needs assistance Sitting-balance support: Feet supported Sitting balance-Leahy Scale: Normal     Standing balance support: During functional activity;No upper extremity supported Standing balance-Leahy Scale: Fair                             ADL either performed or assessed with clinical judgement   ADL Overall ADL's : At baseline                                       General ADL Comments: Pt requires increased time for ADL, but able to perform sink level ADL     Vision Patient Visual Report: No change from baseline Additional Comments: Pt able to navigate environment and find ADL objects without any  visual problems     Perception     Praxis      Pertinent Vitals/Pain Pain Assessment: 0-10 Pain Score: 6  Pain Location: headache Pain Descriptors / Indicators: Headache Pain Intervention(s): Monitored during session     Hand Dominance Left   Extremity/Trunk Assessment Upper Extremity Assessment Upper Extremity Assessment: Overall WFL for tasks assessed;Generalized weakness   Lower Extremity Assessment Lower Extremity Assessment: Defer to PT evaluation       Communication Communication Communication: No difficulties   Cognition Arousal/Alertness: Awake/alert Behavior During Therapy: WFL for tasks assessed/performed Overall Cognitive Status: Within Functional Limits for tasks assessed                                     General Comments  Family present during session    Exercises     Shoulder Instructions      Home Living Family/patient expects to be discharged to:: Private residence Living Arrangements: Alone Available Help at Discharge: Family;Available PRN/intermittently Type of Home: Apartment Home Access: Level entry     Home Layout: One level     Bathroom Shower/Tub: Teacher, early years/pre: Standard Bathroom Accessibility: Yes How Accessible: Accessible  via walker Home Equipment: None      Lives With: Alone    Prior Functioning/Environment Level of Independence: Independent        Comments: continues to drive, drives her son to "therapy"        OT Problem List:        OT Treatment/Interventions:      OT Goals(Current goals can be found in the care plan section) Acute Rehab OT Goals Patient Stated Goal: return home OT Goal Formulation: With patient Time For Goal Achievement: 02/26/17 Potential to Achieve Goals: Good  OT Frequency:     Barriers to D/C:            Co-evaluation              AM-PAC PT "6 Clicks" Daily Activity     Outcome Measure Help from another person eating meals?:  None Help from another person taking care of personal grooming?: None Help from another person toileting, which includes using toliet, bedpan, or urinal?: None Help from another person bathing (including washing, rinsing, drying)?: A Little Help from another person to put on and taking off regular upper body clothing?: None Help from another person to put on and taking off regular lower body clothing?: A Little 6 Click Score: 22   End of Session Equipment Utilized During Treatment: Gait belt Nurse Communication: Mobility status  Activity Tolerance: Patient tolerated treatment well Patient left: in chair;with call bell/phone within reach;with family/visitor present                   Time: 1220-1240 OT Time Calculation (min): 20 min Charges:  OT General Charges $OT Visit: 1 Procedure OT Evaluation $OT Eval Low Complexity: 1 Procedure G-Codes:     Hulda Humphrey OTR/L [phone]  Katie [name] 02/12/2017, 4:28 PM

## 2017-02-14 ENCOUNTER — Emergency Department (HOSPITAL_COMMUNITY): Payer: PPO

## 2017-02-14 ENCOUNTER — Encounter (HOSPITAL_COMMUNITY): Payer: Self-pay | Admitting: Interventional Radiology

## 2017-02-14 ENCOUNTER — Inpatient Hospital Stay (HOSPITAL_COMMUNITY)
Admission: EM | Admit: 2017-02-14 | Discharge: 2017-02-18 | DRG: 040 | Disposition: A | Discharge status: Home / Self Care | Payer: PPO | Attending: Family Medicine | Admitting: Family Medicine

## 2017-02-14 DIAGNOSIS — Z85528 Personal history of other malignant neoplasm of kidney: Secondary | ICD-10-CM

## 2017-02-14 DIAGNOSIS — I272 Pulmonary hypertension, unspecified: Secondary | ICD-10-CM | POA: Diagnosis present

## 2017-02-14 DIAGNOSIS — Z888 Allergy status to other drugs, medicaments and biological substances status: Secondary | ICD-10-CM | POA: Diagnosis not present

## 2017-02-14 DIAGNOSIS — I639 Cerebral infarction, unspecified: Secondary | ICD-10-CM | POA: Diagnosis present

## 2017-02-14 DIAGNOSIS — R7989 Other specified abnormal findings of blood chemistry: Secondary | ICD-10-CM

## 2017-02-14 DIAGNOSIS — Z8673 Personal history of transient ischemic attack (TIA), and cerebral infarction without residual deficits: Secondary | ICD-10-CM | POA: Diagnosis not present

## 2017-02-14 DIAGNOSIS — J918 Pleural effusion in other conditions classified elsewhere: Secondary | ICD-10-CM | POA: Diagnosis not present

## 2017-02-14 DIAGNOSIS — I5033 Acute on chronic diastolic (congestive) heart failure: Secondary | ICD-10-CM | POA: Diagnosis present

## 2017-02-14 DIAGNOSIS — Z79899 Other long term (current) drug therapy: Secondary | ICD-10-CM

## 2017-02-14 DIAGNOSIS — R531 Weakness: Secondary | ICD-10-CM | POA: Diagnosis not present

## 2017-02-14 DIAGNOSIS — I340 Nonrheumatic mitral (valve) insufficiency: Secondary | ICD-10-CM | POA: Diagnosis not present

## 2017-02-14 DIAGNOSIS — R29701 NIHSS score 1: Secondary | ICD-10-CM | POA: Diagnosis present

## 2017-02-14 DIAGNOSIS — M5481 Occipital neuralgia: Secondary | ICD-10-CM | POA: Diagnosis present

## 2017-02-14 DIAGNOSIS — Z7982 Long term (current) use of aspirin: Secondary | ICD-10-CM

## 2017-02-14 DIAGNOSIS — R000 Tachycardia, unspecified: Secondary | ICD-10-CM | POA: Diagnosis not present

## 2017-02-14 DIAGNOSIS — M25472 Effusion, left ankle: Secondary | ICD-10-CM

## 2017-02-14 DIAGNOSIS — R51 Headache: Secondary | ICD-10-CM | POA: Diagnosis not present

## 2017-02-14 DIAGNOSIS — R7401 Elevation of levels of liver transaminase levels: Secondary | ICD-10-CM | POA: Diagnosis present

## 2017-02-14 DIAGNOSIS — R079 Chest pain, unspecified: Secondary | ICD-10-CM | POA: Diagnosis present

## 2017-02-14 DIAGNOSIS — Z823 Family history of stroke: Secondary | ICD-10-CM

## 2017-02-14 DIAGNOSIS — Z905 Acquired absence of kidney: Secondary | ICD-10-CM

## 2017-02-14 DIAGNOSIS — I700 Atherosclerosis of aorta: Secondary | ICD-10-CM | POA: Diagnosis not present

## 2017-02-14 DIAGNOSIS — I63442 Cerebral infarction due to embolism of left cerebellar artery: Secondary | ICD-10-CM | POA: Diagnosis present

## 2017-02-14 DIAGNOSIS — N183 Chronic kidney disease, stage 3 (moderate): Secondary | ICD-10-CM | POA: Diagnosis present

## 2017-02-14 DIAGNOSIS — G43709 Chronic migraine without aura, not intractable, without status migrainosus: Secondary | ICD-10-CM

## 2017-02-14 DIAGNOSIS — I10 Essential (primary) hypertension: Secondary | ICD-10-CM | POA: Diagnosis not present

## 2017-02-14 DIAGNOSIS — I081 Rheumatic disorders of both mitral and tricuspid valves: Secondary | ICD-10-CM | POA: Diagnosis present

## 2017-02-14 DIAGNOSIS — I130 Hypertensive heart and chronic kidney disease with heart failure and stage 1 through stage 4 chronic kidney disease, or unspecified chronic kidney disease: Secondary | ICD-10-CM | POA: Diagnosis present

## 2017-02-14 DIAGNOSIS — I638 Other cerebral infarction: Secondary | ICD-10-CM | POA: Diagnosis not present

## 2017-02-14 DIAGNOSIS — R5383 Other fatigue: Secondary | ICD-10-CM

## 2017-02-14 DIAGNOSIS — E785 Hyperlipidemia, unspecified: Secondary | ICD-10-CM | POA: Diagnosis present

## 2017-02-14 DIAGNOSIS — J90 Pleural effusion, not elsewhere classified: Secondary | ICD-10-CM

## 2017-02-14 DIAGNOSIS — R0602 Shortness of breath: Secondary | ICD-10-CM

## 2017-02-14 DIAGNOSIS — G4489 Other headache syndrome: Secondary | ICD-10-CM | POA: Diagnosis not present

## 2017-02-14 DIAGNOSIS — Z7902 Long term (current) use of antithrombotics/antiplatelets: Secondary | ICD-10-CM

## 2017-02-14 DIAGNOSIS — R778 Other specified abnormalities of plasma proteins: Secondary | ICD-10-CM

## 2017-02-14 DIAGNOSIS — I6312 Cerebral infarction due to embolism of basilar artery: Secondary | ICD-10-CM | POA: Diagnosis present

## 2017-02-14 DIAGNOSIS — M25471 Effusion, right ankle: Secondary | ICD-10-CM

## 2017-02-14 DIAGNOSIS — R748 Abnormal levels of other serum enzymes: Secondary | ICD-10-CM | POA: Diagnosis not present

## 2017-02-14 HISTORY — DX: Cerebral infarction, unspecified: I63.9

## 2017-02-14 LAB — ECHOCARDIOGRAM COMPLETE
Height: 62 in
Weight: 2612.8 oz

## 2017-02-14 LAB — PROTIME-INR
INR: 1.15
Prothrombin Time: 14.7 seconds (ref 11.4–15.2)

## 2017-02-14 LAB — BASIC METABOLIC PANEL
Anion gap: 9 (ref 5–15)
BUN: 18 mg/dL (ref 6–20)
CO2: 23 mmol/L (ref 22–32)
Calcium: 9.6 mg/dL (ref 8.9–10.3)
Chloride: 108 mmol/L (ref 101–111)
Creatinine, Ser: 1.51 mg/dL — ABNORMAL HIGH (ref 0.44–1.00)
GFR calc Af Amer: 39 mL/min — ABNORMAL LOW (ref >60)
GFR calc non Af Amer: 34 mL/min — ABNORMAL LOW (ref >60)
Glucose, Bld: 101 mg/dL — ABNORMAL HIGH (ref 65–99)
Potassium: 3.9 mmol/L (ref 3.5–5.1)
Sodium: 140 mmol/L (ref 135–145)

## 2017-02-14 LAB — CBC
HCT: 38.6 % (ref 36.0–46.0)
Hemoglobin: 12.6 g/dL (ref 12.0–15.0)
MCH: 29.6 pg (ref 26.0–34.0)
MCHC: 32.6 g/dL (ref 30.0–36.0)
MCV: 90.8 fL (ref 78.0–100.0)
Platelets: 258 10*3/uL (ref 150–400)
RBC: 4.25 MIL/uL (ref 3.87–5.11)
RDW: 13.8 % (ref 11.5–15.5)
WBC: 3.9 10*3/uL — ABNORMAL LOW (ref 4.0–10.5)

## 2017-02-14 LAB — I-STAT TROPONIN, ED: Troponin i, poc: 0.08 ng/mL (ref 0.00–0.08)

## 2017-02-14 LAB — BRAIN NATRIURETIC PEPTIDE: B Natriuretic Peptide: 538 pg/mL — ABNORMAL HIGH (ref 0.0–100.0)

## 2017-02-14 NOTE — ED Notes (Addendum)
Pt ambulatory to restroom. Pt c/o some dizziness and some sob.

## 2017-02-14 NOTE — ED Triage Notes (Signed)
To ED via [facility] for eval of weakness, cp, and HA that started this am. Pt had a stroke last Thursday and was treated with TPA. States she has felt fine until this am. No vomiting. No neuro deficits.

## 2017-02-14 NOTE — ED Notes (Addendum)
Patient transported to CT 

## 2017-02-14 NOTE — ED Provider Notes (Addendum)
[facility] DEPT Provider Note   CSN: [id number] Arrival date & time: 02/14/17  1753     History   Chief Complaint Chief Complaint  Patient presents with  . Nausea  . Chest Pain  . Headache    HPI Katie [name] is a 71 y.o. female.  The history is provided by the patient, medical records and a relative.  Headache   This is a new problem. The current episode started 12 to 24 hours ago. The problem occurs constantly. The problem has been gradually improving. The headache is associated with nothing. The pain is located in the frontal region. The quality of the pain is described as dull. The pain is at a severity of 6/10. The pain is moderate. The pain does not radiate. Associated symptoms include chest pressure and shortness of breath. Pertinent negatives include no fever, no malaise/fatigue, no syncope, no nausea and no vomiting. She has tried nothing for the symptoms.  Shortness of Breath  This is a new problem. The average episode lasts 2 days. The problem occurs continuously.The current episode started yesterday. The problem has not changed since onset.Associated symptoms include headaches, cough, chest pain and leg swelling. Pertinent negatives include no fever, no rhinorrhea, no neck pain, no sputum production, no wheezing, no syncope, no vomiting, no abdominal pain and no rash.    Past Medical History:  Diagnosis Date  . 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 History of kidney cancer    a. s/p right nephrectomy ~2007.  [facility] Hypercholesterolemia   . Hypertension   . Moderate mitral regurgitation 01/08/2016  . Moderate to severe pulmonary hypertension ([place]) 01/08/2016  . Moderate tricuspid regurgitation 01/08/2016  . Stroke [facility])     Patient Active Problem List   Diagnosis Date Noted  . Basilar artery occlusion   . Cerebellar stroke ([place]) 02/10/2017  . Headache 02/10/2017  . Chronic diastolic CHF (congestive heart failure) ([place])  05/04/2016  . Chest pain   . Moderate mitral regurgitation 01/08/2016  . Moderate tricuspid regurgitation 01/08/2016  . Moderate to severe pulmonary hypertension ([place]) 01/08/2016  . Acute CHF (congestive heart failure) ([place]) 01/07/2016  . Elevated troponin 01/07/2016  . Mild hypertension 01/07/2016  . CKD (chronic kidney disease), stage III 01/07/2016  . Hyperlipidemia 01/07/2016  . Acute CHF ([place]) 01/07/2016    Past Surgical History:  Procedure Laterality Date  . CARDIAC CATHETERIZATION N/A 01/12/2016   Procedure: Right/Left Heart Cath and Coronary/Graft Angiography;  Surgeon: Belva Crome, MD;  Location: [facility];  Service: Cardiovascular;  Laterality: N/A;  . CERVICAL POLYPECTOMY N/A 04/23/2013   Procedure: CERVICAL POLYPECTOMY;  Surgeon: Maeola Sarah. Landry Mellow, MD;  Location: [facility];  Service: Gynecology;  Laterality: N/A;  Possible Polypectomy  . HYSTEROSCOPY W/D&C N/A 04/23/2013   Procedure: DILATATION AND CURETTAGE /HYSTEROSCOPY;  Surgeon: Maeola Sarah. Landry Mellow, MD;  Location: [facility];  Service: Gynecology;  Laterality: N/A;  . IR ANGIO INTRA EXTRACRAN SEL COM CAROTID INNOMINATE BILAT MOD SED  02/11/2017  . IR ANGIO VERTEBRAL SEL VERTEBRAL BILAT MOD SED  02/11/2017  . KNEE ARTHROSCOPY Left   . NEPHRECTOMY Right    due to carcinoma    OB History    No data available       Home Medications    Prior to Admission medications   Medication Sig Start Date End Date Taking? Authorizing Provider  acetaminophen (TYLENOL) 325 MG tablet Take 325-650 mg by mouth every  6 (six) hours as needed (for pain or headaches).    [provider]  aspirin EC 81 MG tablet Take 81 mg by mouth every other day.     [provider]  clopidogrel (PLAVIX) 75 MG tablet Take 1 tablet (75 mg total) by mouth daily. 02/13/17   Lady Deutscher, MD  furosemide (LASIX) 20 MG tablet Take 20 mg by mouth daily.  09/09/16   [provider]  latanoprost (XALATAN) 0.005 % ophthalmic solution Place 1  drop into both eyes at bedtime. 02/01/17   [provider]  metoprolol tartrate (LOPRESSOR) 25 MG tablet Take 0.5 tablets (12.5 mg total) by mouth 2 (two) times daily. 01/24/17   Arbutus Leas, NP  potassium chloride (MICRO-K) 10 MEQ CR capsule Take 2 capsules (20 mEq total) by mouth daily. 07/30/16   Belva Crome, MD  pravastatin (PRAVACHOL) 80 MG tablet Take 80 mg by mouth at bedtime.     [provider]    Family History Family History  Problem Relation Age of Onset  . Stroke Brother   . Diabetes Mellitus II Sister     Social History Social History  Substance Use Topics  . Smoking status: Never Smoker  . Smokeless tobacco: Never Used  . Alcohol use No     Allergies   Tramadol   Review of Systems Review of Systems  Constitutional: Positive for fatigue. Negative for chills, diaphoresis, fever and malaise/fatigue.  HENT: Negative for congestion and rhinorrhea.   Eyes: Negative for visual disturbance.  Respiratory: Positive for cough and shortness of breath. Negative for sputum production, chest tightness and wheezing.   Cardiovascular: Positive for chest pain and leg swelling. Negative for syncope.  Gastrointestinal: Negative for abdominal pain, constipation, diarrhea, nausea and vomiting.  Genitourinary: Negative for dysuria and flank pain.  Musculoskeletal: Negative for back pain, neck pain and neck stiffness.  Skin: Negative for rash and wound.  Neurological: Positive for headaches. Negative for weakness, light-headedness and numbness.  Psychiatric/Behavioral: Negative for agitation.  All other systems reviewed and are negative.    Physical Exam Updated Vital Signs BP 135/82 (BP Location: Left Arm)   Pulse 86   Temp 97.8 F (36.6 C) (Oral)   Resp 16   LMP 02/12/2013   SpO2 97%   Physical Exam  Constitutional: She is oriented to person, place, and time. She appears well-developed and well-nourished. No distress.  HENT:  Head: Normocephalic  and atraumatic.  Right Ear: External ear normal.  Left Ear: External ear normal.  Nose: Nose normal.  Mouth/Throat: Oropharynx is clear and moist. No oropharyngeal exudate.  Eyes: Pupils are equal, round, and reactive to light. Conjunctivae and EOM are normal.  Neck: Normal range of motion. Neck supple.  Cardiovascular: Normal rate.   No murmur heard. Pulmonary/Chest: No stridor. No respiratory distress. She has rales. She exhibits no tenderness.  Abdominal: Soft. She exhibits no distension. There is no tenderness. There is no rebound.  Musculoskeletal: She exhibits edema. She exhibits no tenderness.  Neurological: She is alert and oriented to person, place, and time. She has normal reflexes. No cranial nerve deficit or sensory deficit. She exhibits normal muscle tone. Coordination normal.  Skin: Skin is warm. Capillary refill takes less than 2 seconds. No rash noted. She is not diaphoretic. No erythema.  Psychiatric: She has a normal mood and affect.  Nursing note and vitals reviewed.    ED Treatments / Results  Labs (all labs ordered are listed, but only abnormal  results are displayed) Labs Reviewed  BASIC METABOLIC PANEL - Abnormal; Notable for the following:       Result Value   Glucose, Bld 101 (*)    Creatinine, Ser 1.51 (*)    GFR calc non Af Amer 34 (*)    GFR calc Af Amer 39 (*)    All other components within normal limits  CBC - Abnormal; Notable for the following:    WBC 3.9 (*)    All other components within normal limits  BRAIN NATRIURETIC PEPTIDE - Abnormal; Notable for the following:    B Natriuretic Peptide 538.0 (*)    All other components within normal limits  URINALYSIS, ROUTINE W REFLEX MICROSCOPIC - Abnormal; Notable for the following:    APPearance HAZY (*)    Hgb urine dipstick SMALL (*)    Ketones, ur 5 (*)    Squamous Epithelial / LPF 6-30 (*)    All other components within normal limits  I-STAT TROPOININ, ED - Abnormal; Notable for the following:      Troponin i, poc 0.10 (*)    All other components within normal limits  PROTIME-INR  I-STAT TROPOININ, ED    EKG  EKG Interpretation  Date/Time:  Monday February 14 2017 18:31:58 EDT Ventricular Rate:  82 PR Interval:  138 QRS Duration: 76 QT Interval:  394 QTC Calculation: 460 R Axis:   66 Text Interpretation:  Normal sinus rhythm Low voltage QRS Nonspecific ST and T wave abnormality Abnormal ECG When compared to prior, no significant changes seen.  No STEMI Confirmed by Antony Blackbird [phone]) on 02/14/2017 9:40:28 PM       Radiology Dg Chest 2 View  Result Date: 02/14/2017 CLINICAL DATA:  Pain with lying down x1 week. History of stroke last week. EXAM: CHEST  2 VIEW COMPARISON:  02/10/2017 and 08/20/2016 FINDINGS: There is cardiomegaly that appears stable from recent 02/11/2017 comparison but increased in size CT from 08/19/2016. The presence of a pericardial effusion is not entirely excluded. No aortic aneurysm. Trace tiny effusions blunting the costophrenic angles bilaterally. No overt pulmonary edema, pneumothorax or pulmonary consolidations. Minimal bibasilar atelectasis. No acute nor suspicious osseous abnormalities. Surgical clips are seen in the right upper quadrant. IMPRESSION: 1. Cardiomegaly, increased from 08/20/2016 radiographs. Question pericardial effusion or a cardiomyopathy accounting for this appearance. 2. Aortic atherosclerosis without aneurysm. 3. Trace bilateral pleural effusions. Electronically Signed   By: Ashley Royalty M.D.   On: 02/14/2017 19:24   Ct Head Wo Contrast  Result Date: 02/14/2017 CLINICAL DATA:  Initial evaluation for acute headache with leg weakness. History of recent stroke, status post tPA. EXAM: CT HEAD WITHOUT CONTRAST TECHNIQUE: Contiguous axial images were obtained from the base of the skull through the vertex without intravenous contrast. COMPARISON:  Prior MRI and CT from 02/10/2017. FINDINGS: Brain: Evolving 2 cm subacute ischemic infarct  involving the superior left cerebellar hemisphere is seen, now more well-defined as compared to previous study. No significant mass effect. No evidence for hemorrhagic transformation. Small focus of encephalomalacia at the posterior left sylvian filter also unchanged. No acute intracranial hemorrhage. No evidence for acute large vessel territory infarct. No mass lesion, midline shift or mass effect. No hydrocephalus. No extra-axial fluid collection. Vascular: Asymmetric hyperdensity at the basilar tip may be related to previous identified basilar tip thrombus, similar to previous. Skull: Scalp soft tissues and calvarium within normal limits. Sinuses/Orbits: Globes and orbital soft tissues within normal limits. Paranasal sinuses and mastoids are clear. IMPRESSION: 1. Continued interval evolution  of subacute 2 cm left superior cerebellar infarct. No significant mass effect or evidence for hemorrhagic transformation. 2. Asymmetric hyperdensity at the basilar tip, suspected be related to recently identified basilar tip thrombus, better evaluated on recent catheter directed arteriogram. 3. No other new acute intracranial process identified. Electronically Signed   By: Jeannine Boga M.D.   On: 02/14/2017 22:09    Procedures Procedures (including critical care time)  Medications Ordered in ED Medications - No data to display   Initial Impression / Assessment and Plan / ED Course  I have reviewed the triage vital signs and the nursing notes.  Pertinent labs & imaging results that were available during my care of the patient were reviewed by me and considered in my medical decision making (see chart for details).     Katie [name] is a 71 y.o. female with a past medical history significant for hypertension, chronic kidney disease, congestive heart failure, hyperlipidemia, and recent stroke and basilar artery occlusion status post TPA discharge from the hospital 2 days ago who presents with chest pain,  shortness of breath, fatigue, headache, ankle edema, diarrhea, and malaise. Patient is accompanied by family who reports that patient was discharged 2 days ago from this facility for her stroke. He received TPA during the stay. She reports that since this morning, she has had moderate to severe headache. She describes it as a pressure all over her head. She denies any vision changes, nausea, or vomiting. She denies any numbness, tingling, or weakness of any extremities. She denies any facial droop or difficulty speaking. She does however report a dry cough, chest pain and shortness of breath. She says the pain is in her central chest and does not radiate. She describes it as a pressure/burning and aching. She reports that she had some of the discomfort while in the hospital but it returned today. She denies palpitations or diaphoresis. She reports that she cannot catch her breath and her shortness of breath is worsened with exertion and walking. She denies any urinary symptoms, any constipation but does report some mild diarrhea. She denies any rectal bleeding. She does report bilateral ankle edema which is new.  History and exam are seen above. On exam, no focal neurologic deficits are appreciated. Pupils are symmetric and Extreoccular movements intact. Patient does report some black spots in her vision but visual fields are intact. She says the black spots and present since her stroke. She has symmetric grip strength and leg strength. Normal sensation throughout. No facial droop. Lungs had very mild crackles in the bases. No rhonchi. Chest and abdomen nontender. Mild bilateral ankle edema.  Based on patient's headache and recent stroke with TPA administration, there is clinical concern for evolution of stroke or possible hemorrhage. CT of the head ordered showing no acute hemorrhage. Evolution of a stroke present with no mass effect. For her chest pain, initial troponin was negative. EKG showed no acute  changes from prior. CBC revealed no leukocytosis or anemia. BMP showed elevated creatinine to discharge creatinine. BNP elevated at 538, this is the highest it has been in our system.  Chest x-ray was performed showing bilateral pleural effusions and increased cardiac silhouette concerning for possible pleural effusion. EKG did show low voltage.  Based on patient's symptoms of ankle swelling, shortness of breath, and x-ray showing effusions, there is clinical concern for fluid overload. With the patient having continued pressure-like chest pain, troponin will be trended. Patient will likely need echo given the low voltage EKG  and x-ray showing new cardiomegaly to rule out pericardial effusion. Patient has had proven in her chest pain while in the ED but she remained short of breath. When patient tried to ambulate she became symptomatic and was very short of breath. Nursing place the patient on nasal cannula oxygen.  Urinalysis returned showing no evidence of UTI.  Second troponin returned positive. It is rising. In the setting of the patient's chest pain and shortness of breath with concern for fluid overload and rising troponin, patient will need admission.  Hospitalist team called for admission.   Final Clinical Impressions(s) / ED Diagnoses   Final diagnoses:  Shortness of breath  Pleural effusion  Chest pain, unspecified type  Elevated troponin  Ankle edema, bilateral  Fatigue, unspecified type     Clinical Impression: 1. Shortness of breath   2. Pleural effusion   3. Chest pain, unspecified type   4. Elevated troponin   5. Ankle edema, bilateral   6. Fatigue, unspecified type     Disposition: Admit to Hospitalist service    Yair Dusza, Gwenyth Allegra, MD 02/15/17 [phone]    Jewelene Mairena, Gwenyth Allegra, MD 02/15/17 [phone]

## 2017-02-15 ENCOUNTER — Encounter (HOSPITAL_COMMUNITY): Payer: Self-pay | Admitting: Internal Medicine

## 2017-02-15 DIAGNOSIS — I638 Other cerebral infarction: Secondary | ICD-10-CM | POA: Diagnosis not present

## 2017-02-15 DIAGNOSIS — I340 Nonrheumatic mitral (valve) insufficiency: Secondary | ICD-10-CM | POA: Diagnosis not present

## 2017-02-15 DIAGNOSIS — R29701 NIHSS score 1: Secondary | ICD-10-CM | POA: Diagnosis present

## 2017-02-15 DIAGNOSIS — R079 Chest pain, unspecified: Secondary | ICD-10-CM | POA: Diagnosis present

## 2017-02-15 DIAGNOSIS — Z79899 Other long term (current) drug therapy: Secondary | ICD-10-CM | POA: Diagnosis not present

## 2017-02-15 DIAGNOSIS — I081 Rheumatic disorders of both mitral and tricuspid valves: Secondary | ICD-10-CM | POA: Diagnosis present

## 2017-02-15 DIAGNOSIS — I639 Cerebral infarction, unspecified: Secondary | ICD-10-CM | POA: Diagnosis present

## 2017-02-15 DIAGNOSIS — I5033 Acute on chronic diastolic (congestive) heart failure: Secondary | ICD-10-CM | POA: Diagnosis present

## 2017-02-15 DIAGNOSIS — I63442 Cerebral infarction due to embolism of left cerebellar artery: Secondary | ICD-10-CM | POA: Diagnosis present

## 2017-02-15 DIAGNOSIS — Z7902 Long term (current) use of antithrombotics/antiplatelets: Secondary | ICD-10-CM | POA: Diagnosis not present

## 2017-02-15 DIAGNOSIS — Z85528 Personal history of other malignant neoplasm of kidney: Secondary | ICD-10-CM | POA: Diagnosis not present

## 2017-02-15 DIAGNOSIS — I10 Essential (primary) hypertension: Secondary | ICD-10-CM | POA: Diagnosis not present

## 2017-02-15 DIAGNOSIS — Z905 Acquired absence of kidney: Secondary | ICD-10-CM | POA: Diagnosis not present

## 2017-02-15 DIAGNOSIS — Z823 Family history of stroke: Secondary | ICD-10-CM | POA: Diagnosis not present

## 2017-02-15 DIAGNOSIS — Z888 Allergy status to other drugs, medicaments and biological substances status: Secondary | ICD-10-CM | POA: Diagnosis not present

## 2017-02-15 DIAGNOSIS — E785 Hyperlipidemia, unspecified: Secondary | ICD-10-CM | POA: Diagnosis present

## 2017-02-15 DIAGNOSIS — Z7982 Long term (current) use of aspirin: Secondary | ICD-10-CM | POA: Diagnosis not present

## 2017-02-15 DIAGNOSIS — N183 Chronic kidney disease, stage 3 (moderate): Secondary | ICD-10-CM | POA: Diagnosis present

## 2017-02-15 DIAGNOSIS — G4489 Other headache syndrome: Secondary | ICD-10-CM | POA: Diagnosis not present

## 2017-02-15 DIAGNOSIS — I272 Pulmonary hypertension, unspecified: Secondary | ICD-10-CM | POA: Diagnosis present

## 2017-02-15 DIAGNOSIS — R51 Headache: Secondary | ICD-10-CM | POA: Diagnosis not present

## 2017-02-15 DIAGNOSIS — G43709 Chronic migraine without aura, not intractable, without status migrainosus: Secondary | ICD-10-CM | POA: Diagnosis present

## 2017-02-15 DIAGNOSIS — Z8673 Personal history of transient ischemic attack (TIA), and cerebral infarction without residual deficits: Secondary | ICD-10-CM | POA: Diagnosis not present

## 2017-02-15 DIAGNOSIS — I130 Hypertensive heart and chronic kidney disease with heart failure and stage 1 through stage 4 chronic kidney disease, or unspecified chronic kidney disease: Secondary | ICD-10-CM | POA: Diagnosis present

## 2017-02-15 DIAGNOSIS — M5481 Occipital neuralgia: Secondary | ICD-10-CM | POA: Diagnosis present

## 2017-02-15 DIAGNOSIS — I6312 Cerebral infarction due to embolism of basilar artery: Secondary | ICD-10-CM | POA: Diagnosis present

## 2017-02-15 DIAGNOSIS — R748 Abnormal levels of other serum enzymes: Secondary | ICD-10-CM | POA: Diagnosis not present

## 2017-02-15 LAB — URINALYSIS, ROUTINE W REFLEX MICROSCOPIC
Bacteria, UA: NONE SEEN
Bilirubin Urine: NEGATIVE
Glucose, UA: NEGATIVE mg/dL
Ketones, ur: 5 mg/dL — AB
Leukocytes, UA: NEGATIVE
Nitrite: NEGATIVE
Protein, ur: NEGATIVE mg/dL
Specific Gravity, Urine: 1.017 (ref 1.005–1.030)
pH: 5 (ref 5.0–8.0)

## 2017-02-15 LAB — I-STAT TROPONIN, ED: Troponin i, poc: 0.1 ng/mL (ref 0.00–0.08)

## 2017-02-15 LAB — TROPONIN I
Troponin I: 0.07 ng/mL (ref <0.03)
Troponin I: 0.06 ng/mL (ref <0.03)

## 2017-02-15 MED ORDER — POTASSIUM CHLORIDE CRYS ER 20 MEQ PO TBCR
20.0000 meq | EXTENDED_RELEASE_TABLET | Freq: Every day | ORAL | Status: DC
  Administered 2017-02-15 – 2017-02-18 (×4): 20 meq via ORAL
  Filled 2017-02-15 (×4): qty 1

## 2017-02-15 MED ORDER — NITROGLYCERIN 0.4 MG SL SUBL: 0.4000 mg | SUBLINGUAL_TABLET | SUBLINGUAL | Status: DC | PRN

## 2017-02-15 MED ORDER — ONDANSETRON HCL 4 MG/2ML IJ SOLN
4.0000 mg | Freq: Four times a day (QID) | INTRAMUSCULAR | Status: DC | PRN
  Administered 2017-02-15 – 2017-02-17 (×2): 4 mg via INTRAVENOUS
  Filled 2017-02-15 (×2): qty 2

## 2017-02-15 MED ORDER — LATANOPROST 0.005 % OP SOLN
1.0000 [drp] | Freq: Every day | OPHTHALMIC | Status: DC
  Administered 2017-02-15 – 2017-02-17 (×3): 1 [drp] via OPHTHALMIC
  Filled 2017-02-15: qty 2.5

## 2017-02-15 MED ORDER — HEPARIN SODIUM (PORCINE) 5000 UNIT/ML IJ SOLN
5000.0000 [IU] | Freq: Three times a day (TID) | INTRAMUSCULAR | Status: DC
  Administered 2017-02-15 – 2017-02-16 (×4): 5000 [IU] via SUBCUTANEOUS
  Filled 2017-02-15 (×4): qty 1

## 2017-02-15 MED ORDER — POTASSIUM CHLORIDE CRYS ER 10 MEQ PO TBCR
10.0000 meq | EXTENDED_RELEASE_TABLET | Freq: Once | ORAL | Status: AC
  Administered 2017-02-15: 10 meq via ORAL
  Filled 2017-02-15: qty 1

## 2017-02-15 MED ORDER — HEPARIN SODIUM (PORCINE) 5000 UNIT/ML IJ SOLN: 5000.0000 [IU] | Freq: Three times a day (TID) | INTRAMUSCULAR | Status: DC

## 2017-02-15 MED ORDER — PRAVASTATIN SODIUM 40 MG PO TABS
80.0000 mg | ORAL_TABLET | Freq: Every day | ORAL | Status: DC
  Administered 2017-02-15 – 2017-02-17 (×3): 80 mg via ORAL
  Filled 2017-02-15 (×3): qty 2

## 2017-02-15 MED ORDER — FUROSEMIDE 10 MG/ML IJ SOLN
40.0000 mg | Freq: Once | INTRAMUSCULAR | Status: AC
  Administered 2017-02-15: 40 mg via INTRAVENOUS
  Filled 2017-02-15: qty 4

## 2017-02-15 MED ORDER — ACETAMINOPHEN 325 MG PO TABS
650.0000 mg | ORAL_TABLET | Freq: Four times a day (QID) | ORAL | Status: DC | PRN
  Administered 2017-02-15 – 2017-02-17 (×6): 650 mg via ORAL
  Filled 2017-02-15 (×6): qty 2

## 2017-02-15 MED ORDER — ASPIRIN EC 81 MG PO TBEC
81.0000 mg | DELAYED_RELEASE_TABLET | ORAL | Status: DC
  Administered 2017-02-15 – 2017-02-16 (×2): 81 mg via ORAL
  Filled 2017-02-15 (×2): qty 1

## 2017-02-15 MED ORDER — FUROSEMIDE 10 MG/ML IJ SOLN
20.0000 mg | Freq: Two times a day (BID) | INTRAMUSCULAR | Status: DC
  Administered 2017-02-15 – 2017-02-16 (×3): 20 mg via INTRAVENOUS
  Filled 2017-02-15 (×3): qty 2

## 2017-02-15 MED ORDER — CLOPIDOGREL BISULFATE 75 MG PO TABS
75.0000 mg | ORAL_TABLET | Freq: Every day | ORAL | Status: DC
  Administered 2017-02-15 – 2017-02-18 (×4): 75 mg via ORAL
  Filled 2017-02-15 (×4): qty 1

## 2017-02-15 MED ORDER — ALPRAZOLAM 0.25 MG PO TABS
0.2500 mg | ORAL_TABLET | Freq: Two times a day (BID) | ORAL | Status: DC | PRN
  Filled 2017-02-15: qty 1

## 2017-02-15 MED ORDER — METOPROLOL TARTRATE 12.5 MG HALF TABLET
12.5000 mg | ORAL_TABLET | Freq: Two times a day (BID) | ORAL | Status: DC
  Administered 2017-02-15 – 2017-02-18 (×7): 12.5 mg via ORAL
  Filled 2017-02-15 (×7): qty 1

## 2017-02-15 NOTE — ED Notes (Signed)
Pt's daughter would like to be updated if anything happens throughout the night. Natasha Bence [phone].

## 2017-02-15 NOTE — Plan of Care (Signed)
Problem: Education: Goal: Knowledge of [facility] Education information/materials will improve Outcome: Completed/Met Date Met: 02/15/17 Pt educated on the importance of hand hygiene in regards to infection prevention. Pt also educated on the pain rating scale.

## 2017-02-15 NOTE — H&P (Signed)
History and Physical    Katie [name] FGH:[id number] DOB: [birth year] DOA: 02/14/2017  PCP: Seward Carol, MD   Patient coming from: Home.  I have personally briefly reviewed patient's old medical records in [place]  Chief Complaint: Chest Pain.  HPI: Katie [name] is a 71 y.o. female with medical history significant of stage III chronic kidney disease, history of renal cell carcinoma with right nephrectomy in 2007, hyperlipidemia, hypertension, moderate mitral regurgitation, pulmonary hypertension tricuspid regurgitation who was recently admitted and discharged for CVA who is now returning for exertional chest pain, non radiated, pressure like associated with dry cough, dyspnea and lower extremities edema for the past two days. She denies fever, chills, sore throat, palpitations, dizziness or diaphoresis. She denies abdominal pain, nausea, emesis, diarrhea, constipation, melena or hematochezia. She denies dysuria, frequency or hematuria.  ED Course: Workup in the emergency department showed WBC of 3.9, hemoglobin 12.6 g/dL and platelets 258. Sodium 140, potassium 3.9, chloride 108, bicarbonate 23 mmol/L. BUN was 18, creatinine 1.51 and glucose 101 mg/dL. Troponin level was 0.08 ng/mL and BNP 538 pg/mL.  Imaging: Chest radiograph showed cardiomegaly. Repeat CT scan of the brain did not show any new abnormalities.   Review of Systems: As per HPI otherwise 10 point review of systems negative.    Past Medical History:  Diagnosis Date  . 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 History of kidney cancer    a. s/p right nephrectomy ~2007.  Marland Kitchen Hypercholesterolemia   . Hypertension   . Moderate mitral regurgitation 01/08/2016  . Moderate to severe pulmonary hypertension ([place]) 01/08/2016  . Moderate tricuspid regurgitation 01/08/2016  . Stroke [facility])     Past Surgical History:  Procedure Laterality Date  . CARDIAC CATHETERIZATION N/A 01/12/2016    Procedure: Right/Left Heart Cath and Coronary/Graft Angiography;  Surgeon: Belva Crome, MD;  Location: [facility];  Service: Cardiovascular;  Laterality: N/A;  . CERVICAL POLYPECTOMY N/A 04/23/2013   Procedure: CERVICAL POLYPECTOMY;  Surgeon: Maeola Sarah. Landry Mellow, MD;  Location: [facility];  Service: Gynecology;  Laterality: N/A;  Possible Polypectomy  . HYSTEROSCOPY W/D&C N/A 04/23/2013   Procedure: DILATATION AND CURETTAGE /HYSTEROSCOPY;  Surgeon: Maeola Sarah. Landry Mellow, MD;  Location: [facility];  Service: Gynecology;  Laterality: N/A;  . IR ANGIO INTRA EXTRACRAN SEL COM CAROTID INNOMINATE BILAT MOD SED  02/11/2017  . IR ANGIO VERTEBRAL SEL VERTEBRAL BILAT MOD SED  02/11/2017  . KNEE ARTHROSCOPY Left   . NEPHRECTOMY Right    due to carcinoma     reports that she has never smoked. She has never used smokeless tobacco. She reports that she does not drink alcohol or use drugs.  Allergies  Allergen Reactions  . Tramadol Shortness Of Breath, Palpitations and Other (See Comments)    Family History  Problem Relation Age of Onset  . Stroke Brother   . Diabetes Mellitus II Sister     Prior to Admission medications   Medication Sig Start Date End Date Taking? Authorizing Provider  acetaminophen (TYLENOL) 500 MG tablet Take 1,000 mg by mouth every 6 (six) hours as needed for headache (pain).   Yes [provider]  aspirin EC 81 MG tablet Take 81 mg by mouth every other day.    Yes [provider]  clopidogrel (PLAVIX) 75 MG tablet Take 1 tablet (75 mg total) by mouth daily. 02/13/17  Yes Lady Deutscher, MD  furosemide (LASIX) 20  MG tablet Take 20 mg by mouth daily.  09/09/16  Yes [provider]  latanoprost (XALATAN) 0.005 % ophthalmic solution Place 1 drop into both eyes at bedtime. 02/01/17  Yes [provider]  metoprolol tartrate (LOPRESSOR) 25 MG tablet Take 0.5 tablets (12.5 mg total) by mouth 2 (two) times daily. 01/24/17  Yes Arbutus Leas, NP  potassium chloride  (MICRO-K) 10 MEQ CR capsule Take 2 capsules (20 mEq total) by mouth daily. 07/30/16  Yes Belva Crome, MD  pravastatin (PRAVACHOL) 80 MG tablet Take 80 mg by mouth at bedtime.    Yes [provider]    Physical Exam: Vitals:   02/15/17 0107 02/15/17 0115 02/15/17 0223 02/15/17 0400  BP:  111/72 121/85 107/70  Pulse: 84 77 87 70  Resp: 20 20 (!) 26 20  Temp:   97.9 F (36.6 C) 97.8 F (36.6 C)  TempSrc:   Oral Oral  SpO2: 98% 98% 97% 100%  Weight:   71.9 kg (158 lb 8 oz) 71.9 kg (158 lb 8 oz)  Height:   5\' 2"  (1.575 m)     Constitutional: NAD, calm, comfortable Eyes: PERRL, lids and conjunctivae normal ENMT: Mucous membranes are moist. Posterior pharynx clear of any exudate or lesions. Neck: normal, supple, no masses, no thyromegaly Respiratory: Bibasilar rales, no wheezing. Normal respiratory effort. No accessory muscle use.  Cardiovascular: Regular rate and rhythm, no murmurs / rubs / gallops. 1+ lower extremity edema. 2+ pedal pulses. No carotid bruits.  Abdomen: Soft, no tenderness, no masses palpated. No hepatosplenomegaly. Bowel sounds positive.  Musculoskeletal: no clubbing / cyanosis. Good ROM, no contractures. Normal muscle tone.  Skin: no rashes, lesions, ulcers on limited skin exam. Neurologic: CN 2-12 grossly intact. Sensation intact, DTR normal. Strength 5/5 in all 4.  Psychiatric: Normal judgment and insight. Alert and oriented x 3. Normal mood.    Labs on Admission: I have personally reviewed following labs and imaging studies  CBC:  Recent Labs Lab 02/10/17 0900 02/11/17 0349 02/14/17 1841  WBC 4.9 4.2 3.9*  NEUTROABS 3.3  --   --   HGB 14.9 12.8 12.6  HCT 45.0 39.3 38.6  MCV 92.2 93.1 90.8  PLT 263 228 564   Basic Metabolic Panel:  Recent Labs Lab 02/10/17 0900 02/12/17 0534 02/14/17 1841  NA 142 140 140  K 4.4 3.7 3.9  CL 109 111 108  CO2 22 23 23   GLUCOSE 95 77 101*  BUN 18 15 18   CREATININE 1.48* 1.23* 1.51*  CALCIUM 9.8 8.9  9.6   GFR: Estimated Creatinine Clearance: 31.7 mL/min (A) (by C-G formula based on SCr of 1.51 mg/dL (H)). Liver Function Tests:  Recent Labs Lab 02/10/17 1416 02/12/17 0534  AST 20 16  ALT 20 14  ALKPHOS 85 59  BILITOT 1.3* 1.5*  PROT 7.2 6.0*  ALBUMIN 3.9 3.2*   No results for input(s): LIPASE, AMYLASE in the last 168 hours. No results for input(s): AMMONIA in the last 168 hours. Coagulation Profile:  Recent Labs Lab 02/10/17 1416 02/14/17 1841  INR 1.15 1.15   Cardiac Enzymes: No results for input(s): CKTOTAL, CKMB, CKMBINDEX, TROPONINI in the last 168 hours. BNP (last 3 results) No results for input(s): PROBNP in the last 8760 hours. HbA1C: No results for input(s): HGBA1C in the last 72 hours. CBG: No results for input(s): GLUCAP in the last 168 hours. Lipid Profile: No results for input(s): CHOL, HDL, LDLCALC, TRIG, CHOLHDL, LDLDIRECT in the last 72 hours. Thyroid  Function Tests: No results for input(s): TSH, T4TOTAL, FREET4, T3FREE, THYROIDAB in the last 72 hours. Anemia Panel: No results for input(s): VITAMINB12, FOLATE, FERRITIN, TIBC, IRON, RETICCTPCT in the last 72 hours. Urine analysis:    Component Value Date/Time   COLORURINE YELLOW 02/14/2017 2340   APPEARANCEUR HAZY (A) 02/14/2017 2340   LABSPEC 1.017 02/14/2017 2340   PHURINE 5.0 02/14/2017 2340   GLUCOSEU NEGATIVE 02/14/2017 2340   HGBUR SMALL (A) 02/14/2017 2340   BILIRUBINUR NEGATIVE 02/14/2017 2340   KETONESUR 5 (A) 02/14/2017 2340   PROTEINUR NEGATIVE 02/14/2017 2340   UROBILINOGEN 0.2 06/27/2009 1452   NITRITE NEGATIVE 02/14/2017 2340   LEUKOCYTESUR NEGATIVE 02/14/2017 2340    Radiological Exams on Admission: Dg Chest 2 View  Result Date: 02/14/2017 CLINICAL DATA:  Pain with lying down x1 week. History of stroke last week. EXAM: CHEST  2 VIEW COMPARISON:  02/10/2017 and 08/20/2016 FINDINGS: There is cardiomegaly that appears stable from recent 02/11/2017 comparison but increased in  size CT from 08/19/2016. The presence of a pericardial effusion is not entirely excluded. No aortic aneurysm. Trace tiny effusions blunting the costophrenic angles bilaterally. No overt pulmonary edema, pneumothorax or pulmonary consolidations. Minimal bibasilar atelectasis. No acute nor suspicious osseous abnormalities. Surgical clips are seen in the right upper quadrant. IMPRESSION: 1. Cardiomegaly, increased from 08/20/2016 radiographs. Question pericardial effusion or a cardiomyopathy accounting for this appearance. 2. Aortic atherosclerosis without aneurysm. 3. Trace bilateral pleural effusions. Electronically Signed   By: Ashley Royalty M.D.   On: 02/14/2017 19:24   Ct Head Wo Contrast  Result Date: 02/14/2017 CLINICAL DATA:  Initial evaluation for acute headache with leg weakness. History of recent stroke, status post tPA. EXAM: CT HEAD WITHOUT CONTRAST TECHNIQUE: Contiguous axial images were obtained from the base of the skull through the vertex without intravenous contrast. COMPARISON:  Prior MRI and CT from 02/10/2017. FINDINGS: Brain: Evolving 2 cm subacute ischemic infarct involving the superior left cerebellar hemisphere is seen, now more well-defined as compared to previous study. No significant mass effect. No evidence for hemorrhagic transformation. Small focus of encephalomalacia at the posterior left sylvian filter also unchanged. No acute intracranial hemorrhage. No evidence for acute large vessel territory infarct. No mass lesion, midline shift or mass effect. No hydrocephalus. No extra-axial fluid collection. Vascular: Asymmetric hyperdensity at the basilar tip may be related to previous identified basilar tip thrombus, similar to previous. Skull: Scalp soft tissues and calvarium within normal limits. Sinuses/Orbits: Globes and orbital soft tissues within normal limits. Paranasal sinuses and mastoids are clear. IMPRESSION: 1. Continued interval evolution of subacute 2 cm left superior  cerebellar infarct. No significant mass effect or evidence for hemorrhagic transformation. 2. Asymmetric hyperdensity at the basilar tip, suspected be related to recently identified basilar tip thrombus, better evaluated on recent catheter directed arteriogram. 3. No other new acute intracranial process identified. Electronically Signed   By: Jeannine Boga M.D.   On: 02/14/2017 22:09   02/12/2017 echocardiogram complete ------------------------------------------------------------------- LV EF: 55% -   60%  ------------------------------------------------------------------- History:   PMH:  Stroke.  Risk factors:  Hypertension. Dyslipidemia.  ------------------------------------------------------------------- Study Conclusions  - Left ventricle: The cavity size was at the upper limits of   normal. There was mild concentric hypertrophy. Systolic function   was normal. The estimated ejection fraction was in the range of   55% to 60%. Wall motion was normal; there were no regional wall   motion abnormalities. There was a reduced contribution of atrial   contraction to ventricular  filling, due to increased ventricular   diastolic pressure or atrial contractile dysfunction. Doppler   parameters are consistent with a reversible restrictive pattern,   indicative of decreased left ventricular diastolic compliance   and/or increased left atrial pressure (grade 3 diastolic   dysfunction). Doppler parameters are consistent with high   ventricular filling pressure. - Aortic valve: Trileaflet; mildly thickened, mildly calcified   leaflets. - Mitral valve: Mild diffuse thickening of the anterior leaflet.   Mild, late systolicprolapse, involving the anterior leaflet.   There was moderate regurgitation. - Left atrium: The atrium was moderately dilated. - Right atrium: The atrium was mildly dilated. - Tricuspid valve: There was moderate regurgitation. - Pulmonic valve: There was mild  regurgitation. - Pulmonary arteries: PA peak pressure: 61 mm Hg (S).  Impressions:  - The right ventricular systolic pressure was increased consistent   with moderate pulmonary hypertension.   EKG: Independently reviewed. Vent. rate 82 BPM PR interval 138 ms QRS duration 76 ms QT/QTc 394/460 ms P-R-T axes 73 66 114 Normal sinus rhythm Low voltage QRS Nonspecific ST and T wave abnormality Abnormal ECG  Assessment/Plan Principal Problem:   Chest pain Telemetry/Observation. Trend troponin level.  Continue metoprolol, aspirin and clopidogrel. Sublingual NTG as needed.  Active Problems:   Acute on chronic diastolic CHF (congestive heart failure) (HCC) Continue IV furosemide. Monitor intake and output. Fluid restriction.    Elevated troponin Trend troponin level.    Hyperlipidemia Continue pravastatin 80 mg by mouth daily    History of cerebellar stroke (HCC) Continue aspirin 81 mg by mouth daily.  Continue clopidogrel 75 mg by mouth daily.    Hypertension Continue metoprolol 12.5 mg by mouth daily. Monitor blood pressure.    DVT prophylaxis: Heparin SQ. Code Status: Full code. Family Communication:  Disposition Plan: Admit for chest pain workup and CHF with. Consults called:  Admission status: Observation/telemetry.   Reubin Milan MD [facility] Pager [phone]  If 7PM-7AM, please contact night-coverage [email or web address] Password [email or web address]  02/15/2017, 4:09 AM

## 2017-02-15 NOTE — Progress Notes (Signed)
Patient admitted after midnight, please see H&P.  Suspect troponins elevated from increased volume and CHF.  Will monitor labs closely and diurese as able.  Patient already beginning to improve Eulogio Bear DO

## 2017-02-16 ENCOUNTER — Encounter (HOSPITAL_COMMUNITY): Payer: Self-pay | Admitting: General Practice

## 2017-02-16 DIAGNOSIS — I10 Essential (primary) hypertension: Secondary | ICD-10-CM

## 2017-02-16 DIAGNOSIS — G4489 Other headache syndrome: Secondary | ICD-10-CM

## 2017-02-16 DIAGNOSIS — I639 Cerebral infarction, unspecified: Principal | ICD-10-CM

## 2017-02-16 LAB — BASIC METABOLIC PANEL
Anion gap: 9 (ref 5–15)
BUN: 22 mg/dL — ABNORMAL HIGH (ref 6–20)
CO2: 26 mmol/L (ref 22–32)
Calcium: 9.3 mg/dL (ref 8.9–10.3)
Chloride: 103 mmol/L (ref 101–111)
Creatinine, Ser: 1.42 mg/dL — ABNORMAL HIGH (ref 0.44–1.00)
GFR calc Af Amer: 42 mL/min — ABNORMAL LOW (ref >60)
GFR calc non Af Amer: 36 mL/min — ABNORMAL LOW (ref >60)
Glucose, Bld: 104 mg/dL — ABNORMAL HIGH (ref 65–99)
Potassium: 3.8 mmol/L (ref 3.5–5.1)
Sodium: 138 mmol/L (ref 135–145)

## 2017-02-16 MED ORDER — BUTALBITAL-APAP-CAFFEINE 50-325-40 MG PO TABS
1.0000 | ORAL_TABLET | Freq: Two times a day (BID) | ORAL | Status: DC | PRN
  Administered 2017-02-16 – 2017-02-17 (×3): 1 via ORAL
  Filled 2017-02-16 (×3): qty 1

## 2017-02-16 MED ORDER — ENSURE ENLIVE PO LIQD
237.0000 mL | Freq: Two times a day (BID) | ORAL | Status: DC
  Administered 2017-02-18: 237 mL via ORAL

## 2017-02-16 MED ORDER — METOCLOPRAMIDE HCL 5 MG/ML IJ SOLN: 10.0000 mg | Freq: Once | INTRAMUSCULAR | Status: DC

## 2017-02-16 MED ORDER — FUROSEMIDE 40 MG PO TABS: 40.0000 mg | ORAL_TABLET | Freq: Every day | ORAL | Status: DC

## 2017-02-16 MED ORDER — METOCLOPRAMIDE HCL 5 MG/ML IJ SOLN
5.0000 mg | Freq: Once | INTRAMUSCULAR | Status: AC
  Administered 2017-02-16: 5 mg via INTRAVENOUS
  Filled 2017-02-16: qty 2

## 2017-02-16 MED ORDER — DIPHENHYDRAMINE HCL 50 MG/ML IJ SOLN
25.0000 mg | Freq: Once | INTRAMUSCULAR | Status: AC
  Administered 2017-02-16: 25 mg via INTRAVENOUS
  Filled 2017-02-16: qty 1

## 2017-02-16 MED ORDER — FUROSEMIDE 20 MG PO TABS
20.0000 mg | ORAL_TABLET | Freq: Every day | ORAL | Status: DC
  Administered 2017-02-17 – 2017-02-18 (×2): 20 mg via ORAL
  Filled 2017-02-16 (×3): qty 1

## 2017-02-16 MED ORDER — ASPIRIN 325 MG PO TABS
325.0000 mg | ORAL_TABLET | Freq: Every day | ORAL | Status: DC
  Administered 2017-02-17 – 2017-02-18 (×2): 325 mg via ORAL
  Filled 2017-02-16 (×3): qty 1

## 2017-02-16 NOTE — Progress Notes (Signed)
TEE/loop not able to be done until Friday.  Suspect patient will discharge in the AM.  Patient already has it scheduled as an outpatient.  Eulogio Bear DO

## 2017-02-16 NOTE — Progress Notes (Signed)
PROGRESS NOTE    Katie [name]  HAL:[id number] DOB: 10-Dec-1945 DOA: 02/14/2017 PCP: Seward Carol, MD   Outpatient Specialists:    Brief Narrative:  Katie [name] is a 71 y.o. female with medical history significant of stage III chronic kidney disease, history of renal cell carcinoma with right nephrectomy in 2007, hyperlipidemia, hypertension, moderate mitral regurgitation, pulmonary hypertension tricuspid regurgitation who was recently admitted and discharged for CVA who is now returning for exertional chest pain, non radiated, pressure like associated with dry cough, dyspnea and lower extremities edema for the past two days. She denies fever, chills, sore throat, palpitations, dizziness or diaphoresis. She denies abdominal pain, nausea, emesis, diarrhea, constipation, melena or hematochezia. She denies dysuria, frequency or hematuria.   Assessment & Plan:   Principal Problem:   Chest pain Active Problems:   Elevated troponin   Hyperlipidemia   Cerebellar stroke (HCC)   Hypertension   Acute on chronic diastolic CHF (congestive heart failure) (HCC)     Chest pain -suspect due to volume overload -no further pain    Acute on chronic diastolic CHF (congestive heart failure) (HCC) Change to PO lasix -recheck BMP in AM -lasix was held during last admission so suspect volume accumulated from that and will keep patient on her previous lasix dose now that she has been diuresed  Headache -worsening since discharge -appreciate neurology input as patient just had CVA -will try fiorcet -monitor for relief -CT scan does not show bleeding    Elevated troponin Trending down -suspect elevated due to volume overload    Hyperlipidemia Continue pravastatin 80 mg by mouth daily    History of cerebellar stroke (HCC) ASA 325 +plavix x 3 months then plavix alone For outpatient TEE/loop    Hypertension Monitor blood pressure.   DVT prophylaxis:  SCD's  Code Status: Full  Code   Family Communication:   Disposition Plan:     Consultants:      Subjective: Terrible headache this AM  Objective: Vitals:   02/16/17 0026 02/16/17 0500 02/16/17 0739 02/16/17 1114  BP: 96/66 102/71 101/65 101/65  Pulse:  73 69 72  Resp: 13 18 16 14   Temp:  98 F (36.7 C) 98.4 F (36.9 C) 98.9 F (37.2 C)  TempSrc:  Oral Oral Oral  SpO2: 99% 97% 98% 99%  Weight:  70.2 kg (154 lb 12.8 oz)    Height:        Intake/Output Summary (Last 24 hours) at 02/16/17 1344 Last data filed at 02/16/17 0900  Gross per 24 hour  Intake              240 ml  Output              600 ml  Net             -360 ml   Filed Weights   02/15/17 0223 02/15/17 0400 02/16/17 0500  Weight: 71.9 kg (158 lb 8 oz) 71.9 kg (158 lb 8 oz) 70.2 kg (154 lb 12.8 oz)    Examination:  General exam: in bed with washcloth over face- appears uncomfortale Respiratory system: Clear to auscultation. Respiratory effort normal. Cardiovascular system: S1 & S2 heard, RRR. No JVD, murmurs, rubs, gallops or clicks. No pedal edema. Gastrointestinal system: Abdomen is nondistended, soft and nontender. No organomegaly or masses felt. Normal bowel sounds heard. Central nervous system: Alert Extremities: Symmetric 5 x 5 power. Skin: No rashes, lesions or ulcers     Data Reviewed: I have personally  reviewed following labs and imaging studies  CBC:  Recent Labs Lab 02/10/17 0900 02/11/17 0349 02/14/17 1841  WBC 4.9 4.2 3.9*  NEUTROABS 3.3  --   --   HGB 14.9 12.8 12.6  HCT 45.0 39.3 38.6  MCV 92.2 93.1 90.8  PLT 263 228 601   Basic Metabolic Panel:  Recent Labs Lab 02/10/17 0900 02/12/17 0534 02/14/17 1841 02/16/17 0252  NA 142 140 140 138  K 4.4 3.7 3.9 3.8  CL 109 111 108 103  CO2 22 23 23 26   GLUCOSE 95 77 101* 104*  BUN 18 15 18  22*  CREATININE 1.48* 1.23* 1.51* 1.42*  CALCIUM 9.8 8.9 9.6 9.3   GFR: Estimated Creatinine Clearance: 33.3 mL/min (A) (by C-G formula based on SCr  of 1.42 mg/dL (H)). Liver Function Tests:  Recent Labs Lab 02/10/17 1416 02/12/17 0534  AST 20 16  ALT 20 14  ALKPHOS 85 59  BILITOT 1.3* 1.5*  PROT 7.2 6.0*  ALBUMIN 3.9 3.2*   No results for input(s): LIPASE, AMYLASE in the last 168 hours. No results for input(s): AMMONIA in the last 168 hours. Coagulation Profile:  Recent Labs Lab 02/10/17 1416 02/14/17 1841  INR 1.15 1.15   Cardiac Enzymes:  Recent Labs Lab 02/15/17 0645 02/15/17 1140  TROPONINI 0.07* 0.06*   BNP (last 3 results) No results for input(s): PROBNP in the last 8760 hours. HbA1C: No results for input(s): HGBA1C in the last 72 hours. CBG: No results for input(s): GLUCAP in the last 168 hours. Lipid Profile: No results for input(s): CHOL, HDL, LDLCALC, TRIG, CHOLHDL, LDLDIRECT in the last 72 hours. Thyroid Function Tests: No results for input(s): TSH, T4TOTAL, FREET4, T3FREE, THYROIDAB in the last 72 hours. Anemia Panel: No results for input(s): VITAMINB12, FOLATE, FERRITIN, TIBC, IRON, RETICCTPCT in the last 72 hours. Urine analysis:    Component Value Date/Time   COLORURINE YELLOW 02/14/2017 2340   APPEARANCEUR HAZY (A) 02/14/2017 2340   LABSPEC 1.017 02/14/2017 2340   PHURINE 5.0 02/14/2017 2340   GLUCOSEU NEGATIVE 02/14/2017 2340   HGBUR SMALL (A) 02/14/2017 2340   BILIRUBINUR NEGATIVE 02/14/2017 2340   KETONESUR 5 (A) 02/14/2017 2340   PROTEINUR NEGATIVE 02/14/2017 2340   UROBILINOGEN 0.2 06/27/2009 1452   NITRITE NEGATIVE 02/14/2017 2340   LEUKOCYTESUR NEGATIVE 02/14/2017 2340     )No results found for this or any previous visit (from the past 240 hour(s)).    Anti-infectives    None       Radiology Studies: Dg Chest 2 View  Result Date: 02/14/2017 CLINICAL DATA:  Pain with lying down x1 week. History of stroke last week. EXAM: CHEST  2 VIEW COMPARISON:  02/10/2017 and 08/20/2016 FINDINGS: There is cardiomegaly that appears stable from recent 02/11/2017 comparison but  increased in size CT from 08/19/2016. The presence of a pericardial effusion is not entirely excluded. No aortic aneurysm. Trace tiny effusions blunting the costophrenic angles bilaterally. No overt pulmonary edema, pneumothorax or pulmonary consolidations. Minimal bibasilar atelectasis. No acute nor suspicious osseous abnormalities. Surgical clips are seen in the right upper quadrant. IMPRESSION: 1. Cardiomegaly, increased from 08/20/2016 radiographs. Question pericardial effusion or a cardiomyopathy accounting for this appearance. 2. Aortic atherosclerosis without aneurysm. 3. Trace bilateral pleural effusions. Electronically Signed   By: Ashley Royalty M.D.   On: 02/14/2017 19:24   Ct Head Wo Contrast  Result Date: 02/14/2017 CLINICAL DATA:  Initial evaluation for acute headache with leg weakness. History of recent stroke, status post tPA. EXAM: CT  HEAD WITHOUT CONTRAST TECHNIQUE: Contiguous axial images were obtained from the base of the skull through the vertex without intravenous contrast. COMPARISON:  Prior MRI and CT from 02/10/2017. FINDINGS: Brain: Evolving 2 cm subacute ischemic infarct involving the superior left cerebellar hemisphere is seen, now more well-defined as compared to previous study. No significant mass effect. No evidence for hemorrhagic transformation. Small focus of encephalomalacia at the posterior left sylvian filter also unchanged. No acute intracranial hemorrhage. No evidence for acute large vessel territory infarct. No mass lesion, midline shift or mass effect. No hydrocephalus. No extra-axial fluid collection. Vascular: Asymmetric hyperdensity at the basilar tip may be related to previous identified basilar tip thrombus, similar to previous. Skull: Scalp soft tissues and calvarium within normal limits. Sinuses/Orbits: Globes and orbital soft tissues within normal limits. Paranasal sinuses and mastoids are clear. IMPRESSION: 1. Continued interval evolution of subacute 2 cm left  superior cerebellar infarct. No significant mass effect or evidence for hemorrhagic transformation. 2. Asymmetric hyperdensity at the basilar tip, suspected be related to recently identified basilar tip thrombus, better evaluated on recent catheter directed arteriogram. 3. No other new acute intracranial process identified. Electronically Signed   By: Jeannine Boga M.D.   On: 02/14/2017 22:09        Scheduled Meds: . aspirin  325 mg Oral Daily  . clopidogrel  75 mg Oral Daily  . furosemide  20 mg Oral Daily  . heparin  5,000 Units Subcutaneous Q8H  . latanoprost  1 drop Both Eyes QHS  . metoprolol tartrate  12.5 mg Oral BID  . potassium chloride  20 mEq Oral Daily  . pravastatin  80 mg Oral QHS   Continuous Infusions:   LOS: 1 day    Time spent: 25 min    [place], DO [facility] Pager [phone]  If 7PM-7AM, please contact night-coverage [email or web address] Password [email or web address] 02/16/2017, 1:44 PM

## 2017-02-16 NOTE — Consult Note (Signed)
Neurology consult  Referring Physician: Dr. Eliseo Squires    Chief Complaint: headache  HPI: Katie [name] is an 71 y.o. female with history of CHF, renal cancer s/p R nephrectomy 2007, stage III CKD, HTN, HLD, and just discharged on 02/12/17 for stroke who was admitted again for CP. She was found to have acute on chronic heart failure and was treated for such. However, pt continues to complain of HA. Neurology was consulted for HA management.  Pt was admitted from 02/10/17 to 02/12/17 for HA. Stroke workup found acute infarct affecting the superior cerebellum on the left. 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 TTE showed no cardiac source of emboli however moderate pulmonary hypertension was found. LDL 88, A1c 5.6. She was put on dual antiplatelet for 3 months and then Plavix alone. She was also continued on pravastatin 80 mg daily. Due to baseline arthritic occlusion concerning for embolic pattern, she was recommended for TEE and Loop recorder. She often for outpatient TEE and Loop recorder before discharge. During the admission, she continued complaining of headache, but gradually getting better at discharge, although not resolved. Since discharge, she continued to have intermittent headache. However, this morning, she stated her headache worse than before, bifrontal location, photophobia and phonophobia. Neurology was consulted for further management.   Past Medical History:  Diagnosis Date  . 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 History of kidney cancer    a. s/p right nephrectomy ~2007.  Marland Kitchen Hypercholesterolemia   . Hypertension   . Moderate mitral regurgitation 01/08/2016  . Moderate to severe pulmonary hypertension ([place]) 01/08/2016  . Moderate tricuspid regurgitation 01/08/2016  . Stroke [facility])     Past Surgical History:  Procedure Laterality Date  . CARDIAC CATHETERIZATION N/A  01/12/2016   Procedure: Right/Left Heart Cath and Coronary/Graft Angiography;  Surgeon: Belva Crome, MD;  Location: [place] [facility];  Service: Cardiovascular;  Laterality: N/A;  . CERVICAL POLYPECTOMY N/A 04/23/2013   Procedure: CERVICAL POLYPECTOMY;  Surgeon: Maeola Sarah. Landry Mellow, MD;  Location: [name] [facility];  Service: Gynecology;  Laterality: N/A;  Possible Polypectomy  . HYSTEROSCOPY W/D&C N/A 04/23/2013   Procedure: DILATATION AND CURETTAGE /HYSTEROSCOPY;  Surgeon: Maeola Sarah. Landry Mellow, MD;  Location: [place] [facility];  Service: Gynecology;  Laterality: N/A;  . IR ANGIO INTRA EXTRACRAN SEL COM CAROTID INNOMINATE BILAT MOD SED  02/11/2017  . IR ANGIO VERTEBRAL SEL VERTEBRAL BILAT MOD SED  02/11/2017  . KNEE ARTHROSCOPY Left   . NEPHRECTOMY Right    due to carcinoma    Family History  Problem Relation Age of Onset  . Stroke Brother   . Diabetes Mellitus II Sister    Social History:  reports that she has never smoked. She has never used smokeless tobacco. She reports that she does not drink alcohol or use drugs.  Allergies:  Allergies  Allergen Reactions  . Tramadol Shortness Of Breath, Palpitations and Other (See Comments)    Medications:  Scheduled: . aspirin  325 mg Oral Daily  . clopidogrel  75 mg Oral Daily  . feeding supplement (ENSURE ENLIVE)  237 mL Oral BID BM  . furosemide  20 mg Oral Daily  . latanoprost  1 drop Both Eyes QHS  . metoprolol tartrate  12.5 mg Oral BID  . potassium chloride  20 mEq Oral Daily  . pravastatin  80 mg Oral QHS    ROS:  Review of Systems: ROS was attempted today and was able to be performed.  Systems assessed include - Constitutional, Eyes, HENT, Respiratory, Cardiovascular, Gastrointestinal, Genitourinary, Integument/breast, Hematologic/lymphatic, Musculoskeletal, Neurological, Behavioral/Psych, Endocrine, Allergic/Immunologic - the patient complains of only the following symptoms, and all other reviewed systems are negative.   Physical Examination: Blood pressure  101/65, pulse 72, temperature 98.9 F (37.2 C), temperature source Oral, resp. rate 14, height 5\' 2"  (1.575 m), weight 154 lb 12.8 oz (70.2 kg), last menstrual period 02/12/2013, SpO2 99 %.   Temp:  [97.2 F (36.2 C)-98.9 F (37.2 C)] 98.9 F (37.2 C) (07/18 1114) Pulse Rate:  [68-75] 72 (07/18 1114) Resp:  [13-21] 14 (07/18 1114) BP: (88-115)/(53-82) 101/65 (07/18 1114) SpO2:  [93 %-100 %] 99 % (07/18 1114) Weight:  [154 lb 12.8 oz (70.2 kg)] 154 lb 12.8 oz (70.2 kg) (07/18 0500)  General - Well nourished, well developed, mild distress due to headache.  Ophthalmologic - Fundi not visualized due to photophobia.  Cardiovascular - Regular rate and rhythm.  Neck - mild right greater occipital neuralgia.  Mental Status -  Drowsy after benadryl and reglan but orientation to time, place, and person were intact. Language including expression, naming, repetition, comprehension was assessed and found intact. Fund of Knowledge was assessed and was intact.  Cranial Nerves II - XII - II - Visual field intact OU. III, IV, VI - Extraocular movements intact. V - Facial sensation intact bilaterally. VII - Facial movement intact bilaterally. VIII - Hearing & vestibular intact bilaterally. X - Palate elevates symmetrically. XI - Chin turning & shoulder shrug intact bilaterally. XII - Tongue protrusion intact.  Motor Strength - The patient's strength was normal in all extremities and pronator drift was absent.  Bulk was normal and fasciculations were absent.   Motor Tone - Muscle tone was assessed at the neck and appendages and was normal.  Reflexes - The patient's reflexes were 1+ in all extremities and she had no pathological reflexes.  Sensory - Light touch, temperature/pinprick were assessed and were symmetrical.    Coordination - The patient had normal movements in the hands with no ataxia or dysmetria, left HTS mild dysmetria.  Tremor was absent.  Gait and Station - not  tested   Results for orders placed or performed during the hospital encounter of 02/14/17 (from the past 48 hour(s))  Basic metabolic panel     Status: Abnormal   Collection Time: 02/14/17  6:41 PM  Result Value Ref Range   Sodium 140 135 - 145 mmol/L   Potassium 3.9 3.5 - 5.1 mmol/L   Chloride 108 101 - 111 mmol/L   CO2 23 22 - 32 mmol/L   Glucose, Bld 101 (H) 65 - 99 mg/dL   BUN 18 6 - 20 mg/dL   Creatinine, Ser 02/16/17 (H) 0.44 - 1.00 mg/dL   Calcium 9.6 8.9 - 1.99 mg/dL   GFR calc non Af Amer 34 (L) >60 mL/min   GFR calc Af Amer 39 (L) >60 mL/min    Comment: (NOTE) The eGFR has been calculated using the CKD EPI equation. This calculation has not been validated in all clinical situations. eGFR's persistently <60 mL/min signify possible Chronic Kidney Disease.    Anion gap 9 5 - 15  CBC     Status: Abnormal   Collection Time: 02/14/17  6:41 PM  Result Value Ref Range   WBC 3.9 (L) 4.0 - 10.5 K/uL   RBC 4.25 3.87 - 5.11 MIL/uL   Hemoglobin 12.6  12.0 - 15.0 g/dL   HCT 38.6 36.0 - 46.0 %   MCV 90.8 78.0 - 100.0 fL   MCH 29.6 26.0 - 34.0 pg   MCHC 32.6 30.0 - 36.0 g/dL   RDW 13.8 11.5 - 15.5 %   Platelets 258 150 - 400 K/uL  Protime-INR     Status: None   Collection Time: 02/14/17  6:41 PM  Result Value Ref Range   Prothrombin Time 14.7 11.4 - 15.2 seconds   INR 1.15   Brain natriuretic peptide     Status: Abnormal   Collection Time: 02/14/17  6:41 PM  Result Value Ref Range   B Natriuretic Peptide 538.0 (H) 0.0 - 100.0 pg/mL  I-stat troponin, ED     Status: None   Collection Time: 02/14/17  6:56 PM  Result Value Ref Range   Troponin i, poc 0.08 0.00 - 0.08 ng/mL   Comment 3            Comment: Due to the release kinetics of cTnI, a negative result within the first hours of the onset of symptoms does not rule out myocardial infarction with certainty. If myocardial infarction is still suspected, repeat the test at appropriate intervals.   Urinalysis, Routine w  reflex microscopic     Status: Abnormal   Collection Time: 02/14/17 11:40 PM  Result Value Ref Range   Color, Urine YELLOW YELLOW   APPearance HAZY (A) CLEAR   Specific Gravity, Urine 1.017 1.005 - 1.030   pH 5.0 5.0 - 8.0   Glucose, UA NEGATIVE NEGATIVE mg/dL   Hgb urine dipstick SMALL (A) NEGATIVE   Bilirubin Urine NEGATIVE NEGATIVE   Ketones, ur 5 (A) NEGATIVE mg/dL   Protein, ur NEGATIVE NEGATIVE mg/dL   Nitrite NEGATIVE NEGATIVE   Leukocytes, UA NEGATIVE NEGATIVE   RBC / HPF 0-5 0 - 5 RBC/hpf   WBC, UA 0-5 0 - 5 WBC/hpf   Bacteria, UA NONE SEEN NONE SEEN   Squamous Epithelial / LPF 6-30 (A) NONE SEEN  I-Stat Troponin, ED (not at [facility])     Status: Abnormal   Collection Time: 02/15/17 12:07 AM  Result Value Ref Range   Troponin i, poc 0.10 (HH) 0.00 - 0.08 ng/mL   Comment NOTIFIED PHYSICIAN    Comment 3            Comment: Due to the release kinetics of cTnI, a negative result within the first hours of the onset of symptoms does not rule out myocardial infarction with certainty. If myocardial infarction is still suspected, repeat the test at appropriate intervals.   Troponin I (q 6hr x 3)     Status: Abnormal   Collection Time: 02/15/17  6:45 AM  Result Value Ref Range   Troponin I 0.07 (HH) <0.03 ng/mL    Comment: CRITICAL RESULT CALLED TO, READ BACK BY AND VERIFIED WITH: T.MILAM,RN 0935 02/15/17 CLARK,S   Troponin I (q 6hr x 3)     Status: Abnormal   Collection Time: 02/15/17 11:40 AM  Result Value Ref Range   Troponin I 0.06 (HH) <0.03 ng/mL    Comment: CRITICAL VALUE NOTED.  VALUE IS CONSISTENT WITH PREVIOUSLY REPORTED AND CALLED VALUE.  Basic metabolic panel     Status: Abnormal   Collection Time: 02/16/17  2:52 AM  Result Value Ref Range   Sodium 138 135 - 145 mmol/L   Potassium 3.8 3.5 - 5.1 mmol/L   Chloride 103 101 - 111 mmol/L   CO2 26 22 -  32 mmol/L   Glucose, Bld 104 (H) 65 - 99 mg/dL   BUN 22 (H) 6 - 20 mg/dL   Creatinine, Ser 1.42 (H) 0.44 - 1.00  mg/dL   Calcium 9.3 8.9 - 10.3 mg/dL   GFR calc non Af Amer 36 (L) >60 mL/min   GFR calc Af Amer 42 (L) >60 mL/min    Comment: (NOTE) The eGFR has been calculated using the CKD EPI equation. This calculation has not been validated in all clinical situations. eGFR's persistently <60 mL/min signify possible Chronic Kidney Disease.    Anion gap 9 5 - 15   I have personally reviewed the radiological images below and agree with the radiology interpretations.  Dg Chest 2 View  Result Date: 02/14/2017 CLINICAL DATA:  Pain with lying down x1 week. History of stroke last week. EXAM: CHEST  2 VIEW COMPARISON:  02/10/2017 and 08/20/2016 FINDINGS: There is cardiomegaly that appears stable from recent 02/11/2017 comparison but increased in size CT from 08/19/2016. The presence of a pericardial effusion is not entirely excluded. No aortic aneurysm. Trace tiny effusions blunting the costophrenic angles bilaterally. No overt pulmonary edema, pneumothorax or pulmonary consolidations. Minimal bibasilar atelectasis. No acute nor suspicious osseous abnormalities. Surgical clips are seen in the right upper quadrant. IMPRESSION: 1. Cardiomegaly, increased from 08/20/2016 radiographs. Question pericardial effusion or a cardiomyopathy accounting for this appearance. 2. Aortic atherosclerosis without aneurysm. 3. Trace bilateral pleural effusions. Electronically Signed   By: Ashley Royalty M.D.   On: 02/14/2017 19:24   Ct Head Wo Contrast  Result Date: 02/14/2017 CLINICAL DATA:  Initial evaluation for acute headache with leg weakness. History of recent stroke, status post tPA. EXAM: CT HEAD WITHOUT CONTRAST TECHNIQUE: Contiguous axial images were obtained from the base of the skull through the vertex without intravenous contrast. COMPARISON:  Prior MRI and CT from 02/10/2017. FINDINGS: Brain: Evolving 2 cm subacute ischemic infarct involving the superior left cerebellar hemisphere is seen, now more well-defined as compared  to previous study. No significant mass effect. No evidence for hemorrhagic transformation. Small focus of encephalomalacia at the posterior left sylvian filter also unchanged. No acute intracranial hemorrhage. No evidence for acute large vessel territory infarct. No mass lesion, midline shift or mass effect. No hydrocephalus. No extra-axial fluid collection. Vascular: Asymmetric hyperdensity at the basilar tip may be related to previous identified basilar tip thrombus, similar to previous. Skull: Scalp soft tissues and calvarium within normal limits. Sinuses/Orbits: Globes and orbital soft tissues within normal limits. Paranasal sinuses and mastoids are clear. IMPRESSION: 1. Continued interval evolution of subacute 2 cm left superior cerebellar infarct. No significant mass effect or evidence for hemorrhagic transformation. 2. Asymmetric hyperdensity at the basilar tip, suspected be related to recently identified basilar tip thrombus, better evaluated on recent catheter directed arteriogram. 3. No other new acute intracranial process identified. Electronically Signed   By: Jeannine Boga M.D.   On: 02/14/2017 22:09    Assessment: 71 y.o. female with history of CHF, renal cancer s/p R nephrectomy 2007, stage III CKD, HTN, HLD, and just discharged on 02/12/17 for stroke who was admitted again for acute on chronic heart failure and was treated for such. However, pt continues to complain of HA since last admission from 02/10/17 to 02/12/17. 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 She was put on dual antiplatelet for 3 months and then Plavix alone. She was also continued on pravastatin 80 mg daily.  She often for outpatient TEE and Loop recorder. During the admission, she continued complaining of headache, but gradually getting better at discharge, although not resolved. Since discharge, she continued to have intermittent  headache. However, this morning, her headache worse than before, bifrontal location, photophobia and phonophobia. Exam no significant neuro change except mild right occipital neuralgia. Does not feel the source of HA. CT 2 days ago no hydrocephalus, and pt no neuro changes. Will not repeat CT at this time. It still more consistent with her hx of migraine. She just had migraine cocktail, will add fioricet PRN for HA. Since she is admitted will request inpt TEE and loop.    Plan: - does not feel head CT needed at this time. However, if HA not getting better over time, will do CT head to rule out hydrocephalus - Add Fioricet when necessary for headache - may consider compazine 55m IV for abortive therapy as needed.  - may consider depakote [id number] for HA prevention if current treatment not effective - Recommend inpatient TEE and loop recorder this time.  JiRosalin HawkingMD PhD Stroke Neurology 02/16/2017 4:05 PM

## 2017-02-17 ENCOUNTER — Other Ambulatory Visit: Payer: Self-pay

## 2017-02-17 DIAGNOSIS — G43709 Chronic migraine without aura, not intractable, without status migrainosus: Secondary | ICD-10-CM

## 2017-02-17 DIAGNOSIS — R51 Headache: Secondary | ICD-10-CM

## 2017-02-17 LAB — BASIC METABOLIC PANEL
Anion gap: 7 (ref 5–15)
BUN: 26 mg/dL — ABNORMAL HIGH (ref 6–20)
CO2: 27 mmol/L (ref 22–32)
Calcium: 9.2 mg/dL (ref 8.9–10.3)
Chloride: 106 mmol/L (ref 101–111)
Creatinine, Ser: 1.55 mg/dL — ABNORMAL HIGH (ref 0.44–1.00)
GFR calc Af Amer: 38 mL/min — ABNORMAL LOW (ref >60)
GFR calc non Af Amer: 33 mL/min — ABNORMAL LOW (ref >60)
Glucose, Bld: 98 mg/dL (ref 65–99)
Potassium: 4 mmol/L (ref 3.5–5.1)
Sodium: 140 mmol/L (ref 135–145)

## 2017-02-17 MED ORDER — DIVALPROEX SODIUM 500 MG PO DR TAB: 500.0000 mg | DELAYED_RELEASE_TABLET | Freq: Every day | ORAL | 0 refills | Status: DC

## 2017-02-17 MED ORDER — DIVALPROEX SODIUM 500 MG PO DR TAB
500.0000 mg | DELAYED_RELEASE_TABLET | Freq: Every day | ORAL | Status: DC
  Administered 2017-02-17: 500 mg via ORAL
  Filled 2017-02-17: qty 1

## 2017-02-17 MED ORDER — ENSURE ENLIVE PO LIQD: 237.0000 mL | Freq: Two times a day (BID) | ORAL | 0 refills | Status: AC

## 2017-02-17 MED ORDER — ASPIRIN 325 MG PO TABS: 325.0000 mg | ORAL_TABLET | Freq: Every day | ORAL | Status: DC

## 2017-02-17 MED ORDER — SODIUM CHLORIDE 0.9 % IV SOLN
INTRAVENOUS | Status: DC
  Administered 2017-02-18: 07:00:00 via INTRAVENOUS

## 2017-02-17 MED ORDER — BUTALBITAL-APAP-CAFFEINE 50-325-40 MG PO TABS: 1.0000 | ORAL_TABLET | Freq: Two times a day (BID) | ORAL | 0 refills | Status: DC | PRN

## 2017-02-17 MED ORDER — PANTOPRAZOLE SODIUM 40 MG PO TBEC: 40.0000 mg | DELAYED_RELEASE_TABLET | Freq: Every day | ORAL | 0 refills | Status: DC

## 2017-02-17 NOTE — Progress Notes (Signed)
    [facility] has been requested to perform a transesophageal echocardiogram on Katie [name] for CVA.  After careful review of history and examination, the risks and benefits of transesophageal echocardiogram have been explained including risks of esophageal damage, perforation (1:10,000 risk), bleeding, pharyngeal hematoma as well as other potential complications associated with conscious sedation including aspiration, arrhythmia, respiratory failure and death. Alternatives to treatment were discussed, questions were answered. Patient is willing to proceed.   The procedure is scheduled for tomorrow at 12:00 with Dr. Acie Fredrickson.  The patient has soft blood pressures today. Will need to make sure BP is stable for sedation tomorrow.   Daune Perch, NP  02/17/2017 3:45 PM

## 2017-02-17 NOTE — Progress Notes (Signed)
Text paged MD because patient c/o nausea, chest discomfort and a H/A, will give Zofran IV, Tylenol and get an EKG, VSS, awaiting call back, will continue to monitor.

## 2017-02-17 NOTE — Progress Notes (Signed)
PROGRESS NOTE    Katie [name]  NID:[id number] DOB: 08/26/1945 DOA: 02/14/2017 PCP: Seward Carol, MD   Outpatient Specialists:    Brief Narrative:  Katie [name] is a 71 y.o. female with medical history significant of stage III chronic kidney disease, history of renal cell carcinoma with right nephrectomy in 2007, hyperlipidemia, hypertension, moderate mitral regurgitation, pulmonary hypertension tricuspid regurgitation who was recently admitted and discharged for CVA who is now returning for exertional chest pain, non radiated, pressure like associated with dry cough, dyspnea and lower extremities edema for the past two days. She denies fever, chills, sore throat, palpitations, dizziness or diaphoresis. She denies abdominal pain, nausea, emesis, diarrhea, constipation, melena or hematochezia. She denies dysuria, frequency or hematuria.   Assessment & Plan:   Principal Problem:   Chest pain Active Problems:   Elevated troponin   Hyperlipidemia   Cerebellar stroke (HCC)   Hypertension   Acute on chronic diastolic CHF (congestive heart failure) (HCC)     Chest pain -suspect due to volume overload vs GI -improved with PPI    Acute on chronic diastolic CHF (congestive heart failure) (HCC) Change to PO lasix -follow BMP in AM -lasix was held during last admission so suspect volume accumulated from that and will keep patient on her previous lasix dose now that she has been diuresed  Headache initially worse but not improved with PRN fiorcet, neuro to add depakote q PM -appreciate neurology input as patient just had CVA -CT scan does not show bleeding    Elevated troponin Trending down -suspect elevated due to volume overload    Hyperlipidemia Continue pravastatin 80 mg by mouth daily    History of cerebellar stroke (HCC) ASA 325 +plavix x 3 months then plavix alone Initially patient did not want TEE/loop in hospital but today changed mind, neuro supports inpatient  TEE/loop and thus it was arranged with cardiology for the AM    Hypertension Monitor blood pressure.   DVT prophylaxis:  SCD's  Code Status: Full Code   Family Communication:   Disposition Plan:  Home after TEE/loop   Consultants:      Subjective: Head ache better Had some chest discomfort this AM that has since resolved  Objective: Vitals:   02/16/17 1748 02/16/17 2033 02/16/17 2118 02/17/17 0500  BP: 105/72 106/63 106/63 106/66  Pulse: 70 75 72 73  Resp: 16 (!) 27  20  Temp: 98.1 F (36.7 C) (!) 97.4 F (36.3 C)  98.1 F (36.7 C)  TempSrc: Oral Oral  Oral  SpO2: 98% 99%  100%  Weight:    70.6 kg (155 lb 11.2 oz)  Height:        Intake/Output Summary (Last 24 hours) at 02/17/17 1226 Last data filed at 02/17/17 1443  Gross per 24 hour  Intake              220 ml  Output              450 ml  Net             -230 ml   Filed Weights   02/15/17 0400 02/16/17 0500 02/17/17 0500  Weight: 71.9 kg (158 lb 8 oz) 70.2 kg (154 lb 12.8 oz) 70.6 kg (155 lb 11.2 oz)    Examination:  General exam: NAD- seems to be doing better Respiratory system: no crackles, no wheezing Cardiovascular system: rrr Gastrointestinal system: +BS, soft Central nervous system: Alert Extremities: moves all 4 ext Skin: No rashes, lesions  or ulcers     Data Reviewed: I have personally reviewed following labs and imaging studies  CBC:  Recent Labs Lab 02/11/17 0349 02/14/17 1841  WBC 4.2 3.9*  HGB 12.8 12.6  HCT 39.3 38.6  MCV 93.1 90.8  PLT 228 952   Basic Metabolic Panel:  Recent Labs Lab 02/12/17 0534 02/14/17 1841 02/16/17 0252 02/17/17 0213  NA 140 140 138 140  K 3.7 3.9 3.8 4.0  CL 111 108 103 106  CO2 23 23 26 27   GLUCOSE 77 101* 104* 98  BUN 15 18 22* 26*  CREATININE 1.23* 1.51* 1.42* 1.55*  CALCIUM 8.9 9.6 9.3 9.2   GFR: Estimated Creatinine Clearance: 30.6 mL/min (A) (by C-G formula based on SCr of 1.55 mg/dL (H)). Liver Function  Tests:  Recent Labs Lab 02/10/17 1416 02/12/17 0534  AST 20 16  ALT 20 14  ALKPHOS 85 59  BILITOT 1.3* 1.5*  PROT 7.2 6.0*  ALBUMIN 3.9 3.2*   No results for input(s): LIPASE, AMYLASE in the last 168 hours. No results for input(s): AMMONIA in the last 168 hours. Coagulation Profile:  Recent Labs Lab 02/10/17 1416 02/14/17 1841  INR 1.15 1.15   Cardiac Enzymes:  Recent Labs Lab 02/15/17 0645 02/15/17 1140  TROPONINI 0.07* 0.06*   BNP (last 3 results) No results for input(s): PROBNP in the last 8760 hours. HbA1C: No results for input(s): HGBA1C in the last 72 hours. CBG: No results for input(s): GLUCAP in the last 168 hours. Lipid Profile: No results for input(s): CHOL, HDL, LDLCALC, TRIG, CHOLHDL, LDLDIRECT in the last 72 hours. Thyroid Function Tests: No results for input(s): TSH, T4TOTAL, FREET4, T3FREE, THYROIDAB in the last 72 hours. Anemia Panel: No results for input(s): VITAMINB12, FOLATE, FERRITIN, TIBC, IRON, RETICCTPCT in the last 72 hours. Urine analysis:    Component Value Date/Time   COLORURINE YELLOW 02/14/2017 2340   APPEARANCEUR HAZY (A) 02/14/2017 2340   LABSPEC 1.017 02/14/2017 2340   PHURINE 5.0 02/14/2017 2340   GLUCOSEU NEGATIVE 02/14/2017 2340   HGBUR SMALL (A) 02/14/2017 2340   BILIRUBINUR NEGATIVE 02/14/2017 2340   KETONESUR 5 (A) 02/14/2017 2340   PROTEINUR NEGATIVE 02/14/2017 2340   UROBILINOGEN 0.2 06/27/2009 1452   NITRITE NEGATIVE 02/14/2017 2340   LEUKOCYTESUR NEGATIVE 02/14/2017 2340     )No results found for this or any previous visit (from the past 240 hour(s)).    Anti-infectives    None       Radiology Studies: No results found.      Scheduled Meds: . aspirin  325 mg Oral Daily  . clopidogrel  75 mg Oral Daily  . divalproex  500 mg Oral QHS  . feeding supplement (ENSURE ENLIVE)  237 mL Oral BID BM  . furosemide  20 mg Oral Daily  . latanoprost  1 drop Both Eyes QHS  . metoprolol tartrate  12.5 mg Oral  BID  . potassium chloride  20 mEq Oral Daily  . pravastatin  80 mg Oral QHS   Continuous Infusions:   LOS: 2 days    Time spent: 25 min    [place], DO [facility] Pager [phone]  If 7PM-7AM, please contact night-coverage [email or web address] Password [facility] 02/17/2017, 12:26 PM

## 2017-02-17 NOTE — Progress Notes (Signed)
STROKE TEAM PROGRESS NOTE   SUBJECTIVE (INTERVAL HISTORY) No family at the bedside.  The patient had again CP episode this am and now resolved. She continues to complain intermittent HA but improved from fioricet PRN. Pt admitted that she has migraine HA at baseline probably once every 2-3 days. Will put on preventive medications. OK to have inpt TEE and loop in am.     OBJECTIVE Temp:  [97.4 F (36.3 C)-98.1 F (36.7 C)] 98.1 F (36.7 C) (07/19 0500) Pulse Rate:  [70-75] 73 (07/19 0500) Cardiac Rhythm: Normal sinus rhythm (07/19 1000) Resp:  [16-27] 20 (07/19 0500) BP: (105-106)/(63-72) 106/66 (07/19 0500) SpO2:  [98 %-100 %] 100 % (07/19 0500) Weight:  [155 lb 11.2 oz (70.6 kg)] 155 lb 11.2 oz (70.6 kg) (07/19 0500)  CBC:   Recent Labs Lab 02/11/17 0349 02/14/17 1841  WBC 4.2 3.9*  HGB 12.8 12.6  HCT 39.3 38.6  MCV 93.1 90.8  PLT 228 211    Basic Metabolic Panel:   Recent Labs Lab 02/16/17 0252 02/17/17 0213  NA 138 140  K 3.8 4.0  CL 103 106  CO2 26 27  GLUCOSE 104* 98  BUN 22* 26*  CREATININE 1.42* 1.55*  CALCIUM 9.3 9.2    Lipid Panel:     Component Value Date/Time   CHOL 159 02/10/2017 0900   TRIG 66 02/10/2017 0900   HDL 58 02/10/2017 0900   CHOLHDL 2.7 02/10/2017 0900   VLDL 13 02/10/2017 0900   LDLCALC 88 02/10/2017 0900   HgbA1c:  Lab Results  Component Value Date   HGBA1C 5.6 02/10/2017   Urine Drug Screen:     Component Value Date/Time   LABOPIA NONE DETECTED 02/12/2017 0750   COCAINSCRNUR NONE DETECTED 02/12/2017 0750   LABBENZ POSITIVE (A) 02/12/2017 0750   AMPHETMU NONE DETECTED 02/12/2017 0750   THCU NONE DETECTED 02/12/2017 0750   LABBARB NONE DETECTED 02/12/2017 0750    Alcohol Level No results found for: [place] I have personally reviewed the radiological images below and agree with the radiology interpretations.  Ct Head Wo Contrast 02/10/2017 IMPRESSION: Mild chronic ischemic white matter disease. Low density seen  in superior portion of the left cerebellar hemisphere concerning for infarction of indeterminate age. MRI may be performed for further evaluation.    Mr Angiogram Head Wo Contrast 02/10/2017 IMPRESSION: No anterior circulation abnormal finding. Absence of flow at the distal basilar most consistent with embolic occlusion. Patent posterior communicating artery on the right gives supply to the right posterior cerebral artery. Smaller posterior communicating artery on the left supplies the left posterior cerebral artery. I think there is probably some flow in the right superior cerebellar artery but no demonstrated flow in the left superior cerebellar artery.   Mr Brain Wo Contrast (neuro Protocol) 02/10/2017 IMPRESSION: 2 cm acute infarction affecting the superior cerebellum on the left. Possible punctate acute infarction in the right cerebellum.   DSA - 1.Large filling defect distal basilar artery with near complete obstruction of  both superior cerebellar arteries and posterior cerebral arteries . 2.Bilaterally patent PCOMs   TTE Left ventricle: The cavity size was at the upper limits of   normal. There was mild concentric hypertrophy. Systolic function   was normal. The estimated ejection fraction was in the range of   55% to 60%. Wall motion was normal; there were no regional wall   motion abnormalities. There was a reduced contribution of atrial   contraction to ventricular filling, due to increased  ventricular   diastolic pressure or atrial contractile dysfunction. Doppler   parameters are consistent with a reversible restrictive pattern,   indicative of decreased left ventricular diastolic compliance   and/or increased left atrial pressure (grade 3 diastolic   dysfunction). Doppler parameters are consistent with high   ventricular filling pressure. - Aortic valve: Trileaflet; mildly thickened, mildly calcified   leaflets. - Mitral valve: Mild diffuse thickening of the anterior  leaflet.   Mild, late systolicprolapse, involving the anterior leaflet.   There was moderate regurgitation. - Left atrium: The atrium was moderately dilated. - Right atrium: The atrium was mildly dilated. - Tricuspid valve: There was moderate regurgitation. - Pulmonic valve: There was mild regurgitation. - Pulmonary arteries: PA peak pressure: 61 mm Hg (S). Impressions: - The right ventricular systolic pressure was increased consistent   with moderate pulmonary hypertension.  Ct Head Wo Contrast 02/14/2017 IMPRESSION: 1. Continued interval evolution of subacute 2 cm left superior cerebellar infarct. No significant mass effect or evidence for hemorrhagic transformation. 2. Asymmetric hyperdensity at the basilar tip, suspected be related to recently identified basilar tip thrombus, better evaluated on recent catheter directed arteriogram. 3. No other new acute intracranial process identified.    PHYSICAL EXAM General - Well nourished, well developed, not in acute distress  Ophthalmologic - Fundi not visualized due to photophobia.  Cardiovascular - Regular rate and rhythm.  Neck - mild right greater occipital neuralgia.  Mental Status -  Awake alert and orientation to time, place, and person were intact. Language including expression, naming, repetition, comprehension was assessed and found intact. Fund of Knowledge was assessed and was intact.  Cranial Nerves II - XII - II - Visual field intact OU. III, IV, VI - Extraocular movements intact. V - Facial sensation intact bilaterally. VII - Facial movement intact bilaterally. VIII - Hearing & vestibular intact bilaterally. X - Palate elevates symmetrically. XI - Chin turning & shoulder shrug intact bilaterally. XII - Tongue protrusion intact.  Motor Strength - The patient's strength was normal in all extremities and pronator drift was absent.  Bulk was normal and fasciculations were absent.   Motor Tone - Muscle tone was  assessed at the neck and appendages and was normal.  Reflexes - The patient's reflexes were 1+ in all extremities and she had no pathological reflexes.  Sensory - Light touch, temperature/pinprick were assessed and were symmetrical.    Coordination - The patient had normal movements in the hands with no ataxia or dysmetria, left HTS mild dysmetria.  Tremor was absent.  Gait and Station - not tested  ASSESSMENT/PLAN 71 y.o. female with history of CHF, renal cancer s/p R nephrectomy 2007, stage III CKD, HTN, HLD, and just discharged on 02/12/17 for stroke who was admitted again for acute on chronic heart failure and was treated for such. However, pt continues to complain of HA since last admission from 02/10/17 to 02/12/17. 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 She was put on dual antiplatelet for 3 months and then Plavix alone. She was also continued on pravastatin 80 mg daily. She often for outpatient TEE and Loop recorder. During the admission, she continued complaining of headache, CT 02/14/17 no hydrocephalus.  Stroke: left SCA small infarction due to absent flow at distal BA and both SCAs and both PCAs, embolic pattern, etiology not clear   Resultant no deficit  CT head: Low density seen in superior portion of the left  cerebellar hemisphere concerning for infarction of indeterminate age.   MR brain - 2 cm acute infarction affecting the superior cerebellum on the left. Possible punctate acute infarction in the right cerebellum.   MRA head:  Absence of flow at the distal basilar most consistent with embolic occlusion. Patent posterior communicating artery on the right gives supply to the right posterior cerebral artery. Smaller posterior communicating artery on the left supplies the left posterior cerebral artery.   DSA - distal BA occlusion with occluded b/l SCAs and PCAs   Repeat CT head 02/14/17 no  acute changes, no hydrocephalus  2D Echo EF 55-60%  Since pt admitted this time, will recommend TEE and loop recorder while as inpt  LDL 88  HgbA1c 5.6  SCDs for VTE prophylaxis Diet Heart Room service appropriate? Yes; Fluid consistency: Thin; Fluid restriction: 1200 mL Fluid Diet - low sodium heart healthy Diet NPO time specified  aspirin 81 mg daily prior to admission, now on aspirin 325 mg daily and plavix 75mg . Continue DAPT for 3 months and then plavix alone.   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  Hyperlipidemia  Home meds:  Pravastatin 80mg  PO daily, resumed in hospital  LDL 88 goal < 70  Continue statin at discharge  Other Stroke Risk Factors  Advanced age  Family hx stroke (Brother)  Other Active Problems  History of renal carcinoma   Hospital day # 2  Rosalin Hawking, MD PhD Stroke Neurology 02/17/2017 2:45 PM     To contact Stroke Continuity provider, please refer to [email or web address] After hours, contact General Neurology

## 2017-02-17 NOTE — Care Management Note (Signed)
Case Management Note  Patient Details  Name: Katie [name] MRN: [id number] Date of Birth: 1946/01/15  Subjective/Objective:  Pt presented for Chest Pain- pt is from home alone and plan will be to return home once stable. Per pt she has previously had Outpatient Physical Therapy. Pt feels like she will not need to continue at this time.                   Action/Plan: No home needs identified at this time.   Expected Discharge Date:  02/17/17               Expected Discharge Plan:  Home/Self Care  In-House Referral:  NA  Discharge planning Services  CM Consult  Post Acute Care Choice:  NA Choice offered to:  NA  DME Arranged:  N/A DME Agency:  NA  HH Arranged:  NA HH Agency:  NA  Status of Service:  Completed, signed off  If discussed at [place] of Stay Meetings, dates discussed:    Additional Comments:  Bethena Roys, RN 02/17/2017, 11:15 AM

## 2017-02-18 ENCOUNTER — Other Ambulatory Visit: Payer: Self-pay

## 2017-02-18 ENCOUNTER — Inpatient Hospital Stay (HOSPITAL_COMMUNITY): Payer: PPO

## 2017-02-18 ENCOUNTER — Encounter (HOSPITAL_COMMUNITY)
Admission: EM | Disposition: A | Discharge status: Home / Self Care | Payer: Self-pay | Source: Home / Self Care | Attending: Internal Medicine

## 2017-02-18 ENCOUNTER — Encounter (HOSPITAL_COMMUNITY): Payer: Self-pay | Admitting: *Deleted

## 2017-02-18 DIAGNOSIS — E785 Hyperlipidemia, unspecified: Secondary | ICD-10-CM

## 2017-02-18 DIAGNOSIS — R748 Abnormal levels of other serum enzymes: Secondary | ICD-10-CM

## 2017-02-18 DIAGNOSIS — G43709 Chronic migraine without aura, not intractable, without status migrainosus: Secondary | ICD-10-CM

## 2017-02-18 DIAGNOSIS — I340 Nonrheumatic mitral (valve) insufficiency: Secondary | ICD-10-CM

## 2017-02-18 DIAGNOSIS — I638 Other cerebral infarction: Secondary | ICD-10-CM

## 2017-02-18 DIAGNOSIS — I639 Cerebral infarction, unspecified: Secondary | ICD-10-CM

## 2017-02-18 HISTORY — PX: LOOP RECORDER INSERTION: EP1214

## 2017-02-18 HISTORY — PX: TEE WITHOUT CARDIOVERSION: SHX5443

## 2017-02-18 LAB — BASIC METABOLIC PANEL
Anion gap: 7 (ref 5–15)
BUN: 22 mg/dL — ABNORMAL HIGH (ref 6–20)
CO2: 26 mmol/L (ref 22–32)
Calcium: 9.4 mg/dL (ref 8.9–10.3)
Chloride: 107 mmol/L (ref 101–111)
Creatinine, Ser: 1.53 mg/dL — ABNORMAL HIGH (ref 0.44–1.00)
GFR calc Af Amer: 38 mL/min — ABNORMAL LOW (ref >60)
GFR calc non Af Amer: 33 mL/min — ABNORMAL LOW (ref >60)
Glucose, Bld: 89 mg/dL (ref 65–99)
Potassium: 4.2 mmol/L (ref 3.5–5.1)
Sodium: 140 mmol/L (ref 135–145)

## 2017-02-18 SURGERY — ECHOCARDIOGRAM, TRANSESOPHAGEAL
Anesthesia: Moderate Sedation

## 2017-02-18 SURGERY — LOOP RECORDER INSERTION

## 2017-02-18 MED ORDER — FENTANYL CITRATE (PF) 100 MCG/2ML IJ SOLN
INTRAMUSCULAR | Status: AC
  Filled 2017-02-18: qty 2

## 2017-02-18 MED ORDER — LIDOCAINE-EPINEPHRINE 1 %-1:100000 IJ SOLN
INTRAMUSCULAR | Status: AC
  Filled 2017-02-18: qty 1

## 2017-02-18 MED ORDER — BUTAMBEN-TETRACAINE-BENZOCAINE 2-2-14 % EX AERO
INHALATION_SPRAY | CUTANEOUS | Status: DC | PRN
  Administered 2017-02-18: 2 via TOPICAL

## 2017-02-18 MED ORDER — FENTANYL CITRATE (PF) 100 MCG/2ML IJ SOLN
INTRAMUSCULAR | Status: DC | PRN
  Administered 2017-02-18: 25 ug via INTRAVENOUS

## 2017-02-18 MED ORDER — LIDOCAINE-EPINEPHRINE 1 %-1:100000 IJ SOLN
INTRAMUSCULAR | Status: DC | PRN
  Administered 2017-02-18: 3 mL via INTRADERMAL

## 2017-02-18 MED ORDER — MIDAZOLAM HCL 10 MG/2ML IJ SOLN
INTRAMUSCULAR | Status: DC | PRN
  Administered 2017-02-18 (×2): 2 mg via INTRAVENOUS

## 2017-02-18 MED ORDER — MIDAZOLAM HCL 5 MG/ML IJ SOLN
INTRAMUSCULAR | Status: AC
  Filled 2017-02-18: qty 2

## 2017-02-18 SURGICAL SUPPLY — 2 items
LOOP REVEAL LINQSYS (Prosthesis & Implant Heart) ×2 IMPLANT
PACK LOOP INSERTION (CUSTOM PROCEDURE TRAY) ×2 IMPLANT

## 2017-02-18 NOTE — Care Management Important Message (Signed)
Important Message  Patient Details  Name: Katie [name] MRN: [id number] Date of Birth: 1946-02-20   Medicare Important Message Given:  Yes    Nathen May 02/18/2017, 10:26 AM

## 2017-02-18 NOTE — Progress Notes (Signed)
STROKE TEAM PROGRESS NOTE   SUBJECTIVE (INTERVAL HISTORY) No family at the bedside. Pt is in a wheelchair for TEE test. Patient stated that her headache much improved, no headache last night.     OBJECTIVE Temp:  [97.7 F (36.5 C)-98.4 F (36.9 C)] 97.8 F (36.6 C) (07/20 1354) Pulse Rate:  [65-83] 65 (07/20 1354) Cardiac Rhythm: Normal sinus rhythm (07/20 0740) Resp:  [9-23] 16 (07/20 1354) BP: (84-116)/(29-73) 114/73 (07/20 1354) SpO2:  [98 %-100 %] 100 % (07/20 1354) FiO2 (%):  [0 %] 0 % (07/19 1933) Weight:  [154 lb 9.6 oz (70.1 kg)] 154 lb 9.6 oz (70.1 kg) (07/20 0500)  CBC:   Recent Labs Lab 02/14/17 1841  WBC 3.9*  HGB 12.6  HCT 38.6  MCV 90.8  PLT 572    Basic Metabolic Panel:   Recent Labs Lab 02/17/17 0213 02/18/17 0255  NA 140 140  K 4.0 4.2  CL 106 107  CO2 27 26  GLUCOSE 98 89  BUN 26* 22*  CREATININE 1.55* 1.53*  CALCIUM 9.2 9.4    Lipid Panel:     Component Value Date/Time   CHOL 159 02/10/2017 0900   TRIG 66 02/10/2017 0900   HDL 58 02/10/2017 0900   CHOLHDL 2.7 02/10/2017 0900   VLDL 13 02/10/2017 0900   LDLCALC 88 02/10/2017 0900   HgbA1c:  Lab Results  Component Value Date   HGBA1C 5.6 02/10/2017   Urine Drug Screen:     Component Value Date/Time   LABOPIA NONE DETECTED 02/12/2017 0750   COCAINSCRNUR NONE DETECTED 02/12/2017 0750   LABBENZ POSITIVE (A) 02/12/2017 0750   AMPHETMU NONE DETECTED 02/12/2017 0750   THCU NONE DETECTED 02/12/2017 0750   LABBARB NONE DETECTED 02/12/2017 0750    Alcohol Level No results found for: [place] I have personally reviewed the radiological images below and agree with the radiology interpretations.  Ct Head Wo Contrast 02/10/2017 IMPRESSION: Mild chronic ischemic white matter disease. Low density seen in superior portion of the left cerebellar hemisphere concerning for infarction of indeterminate age. MRI may be performed for further evaluation.    Mr Angiogram Head Wo  Contrast 02/10/2017 IMPRESSION: No anterior circulation abnormal finding. Absence of flow at the distal basilar most consistent with embolic occlusion. Patent posterior communicating artery on the right gives supply to the right posterior cerebral artery. Smaller posterior communicating artery on the left supplies the left posterior cerebral artery. I think there is probably some flow in the right superior cerebellar artery but no demonstrated flow in the left superior cerebellar artery.   Mr Brain Wo Contrast (neuro Protocol) 02/10/2017 IMPRESSION: 2 cm acute infarction affecting the superior cerebellum on the left. Possible punctate acute infarction in the right cerebellum.   DSA - 1.Large filling defect distal basilar artery with near complete obstruction of  both superior cerebellar arteries and posterior cerebral arteries . 2.Bilaterally patent PCOMs   TTE Left ventricle: The cavity size was at the upper limits of   normal. There was mild concentric hypertrophy. Systolic function   was normal. The estimated ejection fraction was in the range of   55% to 60%. Wall motion was normal; there were no regional wall   motion abnormalities. There was a reduced contribution of atrial   contraction to ventricular filling, due to increased ventricular   diastolic pressure or atrial contractile dysfunction. Doppler   parameters are consistent with a reversible restrictive pattern,   indicative of decreased left ventricular diastolic compliance  and/or increased left atrial pressure (grade 3 diastolic   dysfunction). Doppler parameters are consistent with high   ventricular filling pressure. - Aortic valve: Trileaflet; mildly thickened, mildly calcified   leaflets. - Mitral valve: Mild diffuse thickening of the anterior leaflet.   Mild, late systolicprolapse, involving the anterior leaflet.   There was moderate regurgitation. - Left atrium: The atrium was moderately dilated. - Right atrium: The  atrium was mildly dilated. - Tricuspid valve: There was moderate regurgitation. - Pulmonic valve: There was mild regurgitation. - Pulmonary arteries: PA peak pressure: 61 mm Hg (S). Impressions: - The right ventricular systolic pressure was increased consistent   with moderate pulmonary hypertension.  Ct Head Wo Contrast 02/14/2017 IMPRESSION: 1. Continued interval evolution of subacute 2 cm left superior cerebellar infarct. No significant mass effect or evidence for hemorrhagic transformation. 2. Asymmetric hyperdensity at the basilar tip, suspected be related to recently identified basilar tip thrombus, better evaluated on recent catheter directed arteriogram. 3. No other new acute intracranial process identified.   TEE Left Ventrical:  Low normal EF  Mitral Valve: moderate - severe MR  Aortic Valve: normal  Tricuspid Valve: mild - mod TR Pulmonic Valve: trace PI  Left Atrium/ Left atrial appendage: no thrombi  Atrial septum: no PRO or ASD by color flow  Aorta: mild calcification   PHYSICAL EXAM General - Well nourished, well developed, not in acute distress  Ophthalmologic - Fundi not visualized due to photophobia.  Cardiovascular - Regular rate and rhythm.  Mental Status -  Awake alert and orientation to time, place, and person were intact. Language including expression, naming, repetition, comprehension was assessed and found intact. Fund of Knowledge was assessed and was intact.  Cranial Nerves II - XII - II - Visual field intact OU. III, IV, VI - Extraocular movements intact. V - Facial sensation intact bilaterally. VII - Facial movement intact bilaterally. VIII - Hearing & vestibular intact bilaterally. X - Palate elevates symmetrically. XI - Chin turning & shoulder shrug intact bilaterally. XII - Tongue protrusion intact.  Motor Strength - The patient's strength was normal in all extremities and pronator drift was absent.  Bulk was normal and fasciculations  were absent.   Motor Tone - Muscle tone was assessed at the neck and appendages and was normal.  Reflexes - The patient's reflexes were 1+ in all extremities and she had no pathological reflexes.  Sensory - Light touch, temperature/pinprick were assessed and were symmetrical.    Coordination - The patient had normal movements in the hands with no ataxia or dysmetria, left HTS mild dysmetria.  Tremor was absent.  Gait and Station - not tested  ASSESSMENT/PLAN 71 y.o. female with history of CHF, renal cancer s/p R nephrectomy 2007, stage III CKD, HTN, HLD, and just discharged on 02/12/17 for stroke who was admitted again for acute on chronic heart failure and was treated for such. However, pt continues to complain of HA since last admission from 02/10/17 to 02/12/17. 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 She was put on dual antiplatelet for 3 months and then Plavix alone. She was also continued on pravastatin 80 mg daily. She often for outpatient TEE and Loop recorder. During the admission, she continued complaining of headache, CT 02/14/17 no hydrocephalus.  Stroke: left SCA small infarction due to absent flow at distal BA and both SCAs and both PCAs, embolic pattern, etiology not clear   Resultant no  deficit  CT head: Low density seen in superior portion of the left cerebellar hemisphere concerning for infarction of indeterminate age.   MR brain - 2 cm acute infarction affecting the superior cerebellum on the left. Possible punctate acute infarction in the right cerebellum.   MRA head:  Absence of flow at the distal basilar most consistent with embolic occlusion. Patent posterior communicating artery on the right gives supply to the right posterior cerebral artery. Smaller posterior communicating artery on the left supplies the left posterior cerebral artery.   DSA - distal BA occlusion with occluded b/l  SCAs and PCAs   Repeat CT head 02/14/17 no acute changes, no hydrocephalus  2D Echo EF 55-60%  TEE unremarkable  Loop recorder placed  LDL 88  HgbA1c 5.6  SCDs for VTE prophylaxis Diet - low sodium heart healthy Diet Heart Room service appropriate? Yes; Fluid consistency: Thin  aspirin 81 mg daily prior to admission, now on aspirin 325 mg daily and plavix 75mg . Continue DAPT for 3 months and then plavix alone.   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  Hyperlipidemia  Home meds:  Pravastatin 80mg  PO daily, resumed in hospital  LDL 88 goal < 70  Continue statin at discharge  Other Stroke Risk Factors  Advanced age  Family hx stroke (Brother)  Other Active Problems  History of renal carcinoma   Hospital day # 3  Neurology will sign off. Please call with questions. Pt will follow up with Dr. Leonie Man at [facility] on 04/25/17. Thanks for the consult.   Rosalin Hawking, MD PhD Stroke Neurology 02/18/2017 4:32 PM     To contact Stroke Continuity provider, please refer to [email or web address] After hours, contact General Neurology

## 2017-02-18 NOTE — Discharge Instructions (Signed)
Chest wound/implant site care Keep incision clean and dry for 3 days. You can remove outer dressing tomorrow. Leave steri-strips (little pieces of tape) on until seen in the office for wound check appointment. Call the office [phone]) for redness, drainage, swelling, or fever.   PLEASE TAKE ASPIRIN 325 MG DAILY FOR 3 MONTHS WITH PLAVIX AND THEN AFTER 3 MONTHS STOP THE ASPIRIN BUT CONTINUE TAKING THE PLAVIX.     Follow with Primary MD  Seward Carol, MD  and other consultant's as instructed your Hospitalist MD  Please get a complete blood count and chemistry panel checked by your Primary MD at your next visit, and again as instructed by your Primary MD.  Get Medicines reviewed and adjusted: Please take all your medications with you for your next visit with your Primary MD  Laboratory/radiological data: Please request your Primary MD to go over all hospital tests and procedure/radiological results at the follow up, please ask your Primary MD to get all Hospital records sent to his/her office.  In some cases, they will be blood work, cultures and biopsy results pending at the time of your discharge. Please request that your primary care M.D. follows up on these results.  Also Note the following: If you experience worsening of your admission symptoms, develop shortness of breath, life threatening emergency, suicidal or homicidal thoughts you must seek medical attention immediately by calling 911 or calling your MD immediately  if symptoms less severe.  You must read complete instructions/literature along with all the possible adverse reactions/side effects for all the Medicines you take and that have been prescribed to you. Take any new Medicines after you have completely understood and accpet all the possible adverse reactions/side effects.   Do not drive when taking Pain medications or sleeping medications (Benzodaizepines)  Do not take more than prescribed Pain, Sleep and Anxiety  Medications. It is not advisable to combine anxiety,sleep and pain medications without talking with your primary care practitioner  Special Instructions: If you have smoked or chewed Tobacco  in the last 2 yrs please stop smoking, stop any regular Alcohol  and or any Recreational drug use.  Wear Seat belts while driving.  Please note: You were cared for by a hospitalist during your hospital stay. Once you are discharged, your primary care physician will handle any further medical issues. Please note that NO REFILLS for any discharge medications will be authorized once you are discharged, as it is imperative that you return to your primary care physician (or establish a relationship with a primary care physician if you do not have one) for your post hospital discharge needs so that they can reassess your need for medications and monitor your lab values.

## 2017-02-18 NOTE — Progress Notes (Signed)
Initial Nutrition Assessment  DOCUMENTATION CODES:   Not applicable  INTERVENTION:   Ensure Enlive po BID, each supplement provides 350 kcal and 20 grams of protein  NUTRITION DIAGNOSIS:   Increased nutrient needs related to acute illness as evidenced by estimated needs.  GOAL:   Patient will meet greater than or equal to 90% of their needs  MONITOR:   PO intake, Supplement acceptance, Labs, Weight trends  REASON FOR ASSESSMENT:   Malnutrition Screening Tool    ASSESSMENT:   71 yo female with PMH of CHF, recent stroke, HTN, HLD, pulmonary HTN, CKD, stroke, mitral & tricuspid regurgitation who was admitted on 7/16 with CHF exacerbation.  S/P TEE today. Patient not in room, unable to complete nutrition-focused physical exam at this time.  From review of chart, weight continues to trend down.  5.5% weight loss within the past 8 days and 12% over the past year.  Patient is consuming 25-100% of meals.  Labs and medications reviewed.  Diet Order:  Diet - low sodium heart healthy Diet Heart Room service appropriate? Yes; Fluid consistency: Thin  Skin:  Reviewed, no issues  Last BM:  7/19  Height:   Ht Readings from Last 1 Encounters:  02/15/17 5\' 2"  (1.575 m)    Weight:   Wt Readings from Last 1 Encounters:  02/18/17 154 lb 9.6 oz (70.1 kg)    Ideal Body Weight:  50 kg  BMI:  Body mass index is 28.28 kg/m.  Estimated Nutritional Needs:   Kcal:  1550-1750  Protein:  80-95 gm  Fluid:  1.6-1.8 L  EDUCATION NEEDS:   No education needs identified at this time  Katie [name], [place], [place], [place] Pager [phone] After Hours Pager [phone]

## 2017-02-18 NOTE — Consult Note (Signed)
Cardiology Consultation:   Patient ID: Katie [name]; [id number]; Sep 19, 1945   Admit date: 02/14/2017 Date of Consult: 02/18/2017  Primary Care Provider: Seward Carol, MD Primary Cardiologist: Dr. Acie Fredrickson Primary Electrophysiologist:  New to Dr. Curt Bears   Patient Profile:   Katie [name] is a 71 y.o. female with a hx of chronic CHF (iastolic), CRI (III), single kidney (R nephrectomy 2/2 ca), HTN, HLD who is being seen today for the evaluation of ILR s/p CVA at the request of Dr. Erlinda Hong.  History of Present Illness:   Katie [name] was admitted to [facility] 02/14/17 with c/o CP/SOB, felt to have CHF exacerbation and treated with IV diuresis, she had just been discharged after an acute ctroke, admitted then 02/10/17 to 02/12/17 for HA. Stroke workup found acute infarct affecting the superior cerebellum on the left. 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 Due to baseline occlusion concerning for embolic pattern, she was recommended for TEE and Loop recorder and planned for out patient.  Neuro on board for c/o HA, though this morning is not an active complaint, she denies any CP or SOB at current and tells me she is planned for discharge today once testing completed.  The patient denies any kind of hx of palpitations, no dizziness,  Near syncope or syncope, she has no known hx of AF by her account or review of her chart.  Neuro noted her stroke a week ago was left SCA small infarction due to absent flow at distal BA and both SCAs and both PCAs, embolic pattern, etiology not clear.  The patient feels like she is doing well from this standpoint, HA was felt by neuro to bemigrain, not stroke associated.    Stroke work up included below, as well as angio of carotid with her IR procedure last week.  Since the patient was readmitted plans for TEE and ILR were requested.  Labs WBC 3.9 H/H 12/38 plts 258 poc Trop 0.08, 0.10 Trop I: 0.07,  0.06 K+ 4.2 BUN/Creat 22/1.53  Past Medical History:  Diagnosis Date  . 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 History of kidney cancer    a. s/p right nephrectomy ~2007.  Marland Kitchen Hypercholesterolemia   . Hypertension   . Moderate mitral regurgitation 01/08/2016  . Moderate to severe pulmonary hypertension ([place]) 01/08/2016  . Moderate tricuspid regurgitation 01/08/2016  . Stroke [facility])     Past Surgical History:  Procedure Laterality Date  . CARDIAC CATHETERIZATION N/A 01/12/2016   Procedure: Right/Left Heart Cath and Coronary/Graft Angiography;  Surgeon: Belva Crome, MD;  Location: [facility];  Service: Cardiovascular;  Laterality: N/A;  . CERVICAL POLYPECTOMY N/A 04/23/2013   Procedure: CERVICAL POLYPECTOMY;  Surgeon: Maeola Sarah. Landry Mellow, MD;  Location: [facility];  Service: Gynecology;  Laterality: N/A;  Possible Polypectomy  . HYSTEROSCOPY W/D&C N/A 04/23/2013   Procedure: DILATATION AND CURETTAGE /HYSTEROSCOPY;  Surgeon: Maeola Sarah. Landry Mellow, MD;  Location: [facility];  Service: Gynecology;  Laterality: N/A;  . IR ANGIO INTRA EXTRACRAN SEL COM CAROTID INNOMINATE BILAT MOD SED  02/11/2017  . IR ANGIO VERTEBRAL SEL VERTEBRAL BILAT MOD SED  02/11/2017  . KNEE ARTHROSCOPY Left   . NEPHRECTOMY Right    due to carcinoma     Inpatient Medications: Scheduled Meds: . aspirin  325 mg Oral Daily  . clopidogrel  75 mg Oral Daily  . divalproex  500 mg Oral QHS  . feeding supplement (ENSURE ENLIVE)  237 mL Oral BID BM  . furosemide  20 mg Oral Daily  . latanoprost  1 drop Both Eyes QHS  . metoprolol tartrate  12.5 mg Oral BID  . potassium chloride  20 mEq Oral Daily  . pravastatin  80 mg Oral QHS   Continuous Infusions: . sodium chloride 20 mL/hr at 02/18/17 0647   PRN Meds: acetaminophen, ALPRAZolam, butalbital-acetaminophen-caffeine, nitroGLYCERIN, ondansetron (ZOFRAN) IV  Allergies:    Allergies  Allergen Reactions  . Tramadol Shortness Of Breath,  Palpitations and Other (See Comments)    Social History:   Social History   Social History  . Marital status: Divorced    Spouse name: N/A  . Number of children: N/A  . Years of education: N/A   Occupational History  . Not on file.   Social History Main Topics  . Smoking status: Never Smoker  . Smokeless tobacco: Never Used  . Alcohol use No  . Drug use: No  . Sexual activity: Not on file   Other Topics Concern  . Not on file   Social History Narrative  . No narrative on file    Family History:   The patient's family history includes Diabetes Mellitus II in her sister; Stroke in her brother.  ROS:  Please see the history of present illness.  ROS  All other ROS reviewed and negative.     Physical Exam/Data:   Vitals:   02/17/17 1933 02/17/17 1945 02/18/17 0500 02/18/17 0928  BP:  108/68 99/62 105/64  Pulse:  80 67   Resp:  (!) 22 20   Temp:  98.4 F (36.9 C) 98.1 F (36.7 C)   TempSrc:  Oral Oral   SpO2: 100% 98% 100%   Weight:   154 lb 9.6 oz (70.1 kg)   Height:        Intake/Output Summary (Last 24 hours) at 02/18/17 1049 Last data filed at 02/18/17 0654  Gross per 24 hour  Intake           482.33 ml  Output             1600 ml  Net         -1117.67 ml   Filed Weights   02/16/17 0500 02/17/17 0500 02/18/17 0500  Weight: 154 lb 12.8 oz (70.2 kg) 155 lb 11.2 oz (70.6 kg) 154 lb 9.6 oz (70.1 kg)   Body mass index is 28.28 kg/m.  General:  Well nourished, well developed, in no acute distress HEENT: normal Lymph: no adenopathy Neck: no JVD Endocrine:  No thryomegaly Vascular: No carotid bruits; FA pulses 2+ bilaterally without bruits  Cardiac:  normal S1, S2; RRR; no murmur Lungs:  CTA b/l, no wheezing, rhonchi or rales  Abd: soft, nontender Ext: no edema Musculoskeletal:  No deformities, age appropriate atrophy Skin: warm and dry  Neuro:  no gross focal abnormalities appreciated Psych:  Normal affect   EKG:  The EKG was personally reviewed  and demonstrates:  SR All EKGs in Epic have been reviewed all are SR Telemetry:  Telemetry was personally reviewed and demonstrates:  SR only  Relevant CV Studies:  TTE Left ventricle: The cavity size was at the upper limits of normal. There was mild concentric hypertrophy. Systolic function was normal. The estimated ejection fraction was in the range of 55% to 60%. Wall motion was normal; there were no regional wall motion abnormalities. There was a reduced contribution of  atrial contraction to ventricular filling, due to increased ventricular diastolic pressure or atrial contractile dysfunction. Doppler parameters are consistent with a reversible restrictive pattern, indicative of decreased left ventricular diastolic compliance and/or increased left atrial pressure (grade 3 diastolic dysfunction). Doppler parameters are consistent with high ventricular filling pressure. - Aortic valve: Trileaflet; mildly thickened, mildly calcified leaflets. - Mitral valve: Mild diffuse thickening of the anterior leaflet. Mild, late systolicprolapse, involving the anterior leaflet. There was moderate regurgitation. - Left atrium: The atrium was moderately dilated. - Right atrium: The atrium was mildly dilated. - Tricuspid valve: There was moderate regurgitation. - Pulmonic valve: There was mild regurgitation. - Pulmonary arteries: PA peak pressure: 61 mm Hg (S). Impressions: - The right ventricular systolic pressure was increased consistent with moderate pulmonary hypertension.   Laboratory Data:  Chemistry Recent Labs Lab 02/16/17 0252 02/17/17 0213 02/18/17 0255  NA 138 140 140  K 3.8 4.0 4.2  CL 103 106 107  CO2 26 27 26   GLUCOSE 104* 98 89  BUN 22* 26* 22*  CREATININE 1.42* 1.55* 1.53*  CALCIUM 9.3 9.2 9.4  GFRNONAA 36* 33* 33*  GFRAA 42* 38* 38*  ANIONGAP 9 7 7      Recent Labs Lab 02/12/17 0534  PROT 6.0*  ALBUMIN 3.2*  AST 16  ALT 14    ALKPHOS 59  BILITOT 1.5*   Hematology Recent Labs Lab 02/14/17 1841  WBC 3.9*  RBC 4.25  HGB 12.6  HCT 38.6  MCV 90.8  MCH 29.6  MCHC 32.6  RDW 13.8  PLT 258   Cardiac Enzymes Recent Labs Lab 02/15/17 0645 02/15/17 1140  TROPONINI 0.07* 0.06*    Recent Labs Lab 02/14/17 1856 02/15/17 0007  TROPIPOC 0.08 0.10*    BNP Recent Labs Lab 02/14/17 1841  BNP 538.0*     Radiology/Studies:  Dg Chest 2 View Result Date: 02/14/2017 CLINICAL DATA:  Pain with lying down x1 week. History of stroke last week. EXAM: CHEST  2 VIEW COMPARISON:  02/10/2017 and 08/20/2016 FINDINGS: There is cardiomegaly that appears stable from recent 02/11/2017 comparison but increased in size CT from 08/19/2016. The presence of a pericardial effusion is not entirely excluded. No aortic aneurysm. Trace tiny effusions blunting the costophrenic angles bilaterally. No overt pulmonary edema, pneumothorax or pulmonary consolidations. Minimal bibasilar atelectasis. No acute nor suspicious osseous abnormalities. Surgical clips are seen in the right upper quadrant. IMPRESSION: 1. Cardiomegaly, increased from 08/20/2016 radiographs. Question pericardial effusion or a cardiomyopathy accounting for this appearance. 2. Aortic atherosclerosis without aneurysm. 3. Trace bilateral pleural effusions. Electronically Signed   By: Ashley Royalty M.D.   On: 02/14/2017 19:24   Ct Head Wo Contrast Result Date: 02/14/2017 CLINICAL DATA:  Initial evaluation for acute headache with leg weakness. History of recent stroke, status post tPA. EXAM: CT HEAD WITHOUT CONTRAST TECHNIQUE: Contiguous axial images were obtained from the base of the skull through the vertex without intravenous contrast. COMPARISON:  Prior MRI and CT from 02/10/2017. FINDINGS: Brain: Evolving 2 cm subacute ischemic infarct involving the superior left cerebellar hemisphere is seen, now more well-defined as compared to previous study. No significant mass effect. No  evidence for hemorrhagic transformation. Small focus of encephalomalacia at the posterior left sylvian filter also unchanged. No acute intracranial hemorrhage. No evidence for acute large vessel territory infarct. No mass lesion, midline shift or mass effect. No hydrocephalus. No extra-axial fluid collection. Vascular: Asymmetric hyperdensity at the basilar tip may be related to previous identified basilar tip thrombus, similar to previous.  Skull: Scalp soft tissues and calvarium within normal limits. Sinuses/Orbits: Globes and orbital soft tissues within normal limits. Paranasal sinuses and mastoids are clear. IMPRESSION: 1. Continued interval evolution of subacute 2 cm left superior cerebellar infarct. No significant mass effect or evidence for hemorrhagic transformation. 2. Asymmetric hyperdensity at the basilar tip, suspected be related to recently identified basilar tip thrombus, better evaluated on recent catheter directed arteriogram. 3. No other new acute intracranial process identified. Electronically Signed   By: Jeannine Boga M.D.   On: 02/14/2017 22:09   Ct Head Wo Contrast 02/10/2017 IMPRESSION: Mild chronic ischemic white matter disease. Low density seen in superior portion of the left cerebellar hemisphere concerning for infarction of indeterminate age. MRI may be performed for further evaluation.    Mr Angiogram Head Wo Contrast 02/10/2017 IMPRESSION: No anterior circulation abnormal finding. Absence of flow at the distal basilar most consistent with embolic occlusion. Patent posterior communicating artery on the right gives supply to the right posterior cerebral artery. Smaller posterior communicating artery on the left supplies the left posterior cerebral artery. I think there is probably some flow in the right superior cerebellar artery but no demonstrated flow in the left superior cerebellar artery.   Mr Brain Wo Contrast (neuro Protocol) 02/10/2017 IMPRESSION: 2 cm acute  infarction affecting the superior cerebellum on the left. Possible punctate acute infarction in the right cerebellum.   DSA - 1.Large filling defect distal basilar artery with near complete obstruction of both superior cerebellar arteries and posterior cerebral arteries . 2.Bilaterally patent PCOMs      Assessment and Plan:   1. Recent Cryptog[employer] strok(last week)     TTE done at her last stay w/o thrombus     Cerebral angio noted b/l carotid systems widely patent     TEE planned for today The patient was seen today by Dr. Curt Bears, ILR procedure, indication, alternative monitoring such as event monitor was as well discussed by myself, risks/benefits of the ILR implant were discussed. The patient would like to proceed with ILR if TEE is unrevealing.  I have discussed site care and follow up with the patient.    2. CP     Not an ongoing complaint     No hx of CAD     LHC 01/12/16 with widely patent coronaries  3. CHF exacerbation (diastolic)     Exam is euvolemic     She is cumulatively fluid negative -[id number]     IM is managing  ILR wound check follow up Katie [name] be arranged.  Signed, Baldwin Jamaica, PA-C  02/18/2017 10:49 AM  I have seen and examined this patient with Katie [name].  Agree with above, note added to reflect my findings.  On exam, RRR, no murmurs, lungs clear. Patient found to have CVA on presentation to the hospital for noncardiac chest pain. Plan for TEE today and if no source of embolism, Katie [name] plan for [facility] monitor. Risks and benefits discussed. Risks include but not limited to bleeding and infection. The patient understands the risks and has agreed to the procedure.    Katie [name] M. Shai Rasmussen MD 02/18/2017 11:58 AM

## 2017-02-18 NOTE — Progress Notes (Signed)
  Echocardiogram Echocardiogram Transesophageal has been performed.  [name] 02/18/2017, 1:25 PM

## 2017-02-18 NOTE — Interval H&P Note (Signed)
History and Physical Interval Note:  02/18/2017 12:09 PM  Katie [name]  has presented today for surgery, with the diagnosis of STROKE  The various methods of treatment have been discussed with the patient and family. After consideration of risks, benefits and other options for treatment, the patient has consented to  Procedure(s): TRANSESOPHAGEAL ECHOCARDIOGRAM (TEE) WITH LOOP (N/A) as a surgical intervention .  The patient's history has been reviewed, patient examined, no change in status, stable for surgery.  I have reviewed the patient's chart and labs.  Questions were answered to the patient's satisfaction.     [name]

## 2017-02-18 NOTE — Progress Notes (Signed)
PROGRESS NOTE    Katie [name]  EZM:[id number] DOB: 1945-12-21 DOA: 02/14/2017 PCP: Seward Carol, MD   Outpatient Specialists:    Brief Narrative:  Katie [name] is a 71 y.o. female with medical history significant of stage III chronic kidney disease, history of renal cell carcinoma with right nephrectomy in 2007, hyperlipidemia, hypertension, moderate mitral regurgitation, pulmonary hypertension tricuspid regurgitation who was recently admitted and discharged for CVA who is now returning for exertional chest pain, non radiated, pressure like associated with dry cough, dyspnea and lower extremities edema for the past two days. She denies fever, chills, sore throat, palpitations, dizziness or diaphoresis. She denies abdominal pain, nausea, emesis, diarrhea, constipation, melena or hematochezia. She denies dysuria, frequency or hematuria.   Assessment & Plan:   Principal Problem:   Chest pain Active Problems:   Elevated troponin   Hyperlipidemia   Cerebellar stroke (HCC)   Hypertension   Acute on chronic diastolic CHF (congestive heart failure) (HCC)   Chronic migraine without aura without status migrainosus, not intractable    Chest pain -suspect due to volume overload vs GI -improved with PPI    Acute on chronic diastolic CHF (congestive heart failure) Change to PO lasix -follow BMP in AM -lasix was held during last admission so suspect volume accumulated from that and will keep patient on her previous lasix dose now that she has been diuresed  Headache initially worse but not improved with PRN fiorcet, neuro to add depakote q PM -appreciate neurology input as patient just had CVA -CT scan does not show bleeding    Elevated troponin Trending down -suspect elevated due to volume overload    Hyperlipidemia Continue pravastatin 80 mg by mouth daily    History of cerebellar stroke (HCC) ASA 325 +plavix x 3 months then plavix alone Initially patient did not want  TEE/loop in hospital but today changed mind, neuro supports inpatient TEE/loop and thus it was arranged with cardiology for the AM    Hypertension Monitor blood pressure.  DVT prophylaxis:  SCD's  Code Status: Full Code   Family Communication:  bedside  Disposition Plan:  Home after TEE/loop    Consultants:   Cardiology/neurology   Subjective: Pt anxious to have her TEE done today.   Objective: Vitals:   02/17/17 1945 02/18/17 0500 02/18/17 0928 02/18/17 1125  BP: 108/68 99/62 105/64 (!) 84/65  Pulse: 80 67  76  Resp: (!) 22 20  17   Temp: 98.4 F (36.9 C) 98.1 F (36.7 C)  97.7 F (36.5 C)  TempSrc: Oral Oral  Oral  SpO2: 98% 100%  100%  Weight:  70.1 kg (154 lb 9.6 oz)    Height:        Intake/Output Summary (Last 24 hours) at 02/18/17 1147 Last data filed at 02/18/17 0845  Gross per 24 hour  Intake           482.33 ml  Output             2100 ml  Net         -1617.67 ml   Filed Weights   02/16/17 0500 02/17/17 0500 02/18/17 0500  Weight: 70.2 kg (154 lb 12.8 oz) 70.6 kg (155 lb 11.2 oz) 70.1 kg (154 lb 9.6 oz)    Examination:  General exam: NAD cooperative, awake, alert.  Respiratory system: clear to auscultation, no crackles, no wheezing Cardiovascular system: normal s1, s2 sounds, no mrg Gastrointestinal system: +BS, soft Central nervous system: Alert Extremities: moves all 4  ext Skin: No rashes, lesions or ulcers  Data Reviewed: I have personally reviewed following labs and imaging studies  CBC:  Recent Labs Lab 02/14/17 1841  WBC 3.9*  HGB 12.6  HCT 38.6  MCV 90.8  PLT 433   Basic Metabolic Panel:  Recent Labs Lab 02/12/17 0534 02/14/17 1841 02/16/17 0252 02/17/17 0213 02/18/17 0255  NA 140 140 138 140 140  K 3.7 3.9 3.8 4.0 4.2  CL 111 108 103 106 107  CO2 23 23 26 27 26   GLUCOSE 77 101* 104* 98 89  BUN 15 18 22* 26* 22*  CREATININE 1.23* 1.51* 1.42* 1.55* 1.53*  CALCIUM 8.9 9.6 9.3 9.2 9.4   GFR: Estimated  Creatinine Clearance: 30.9 mL/min (A) (by C-G formula based on SCr of 1.53 mg/dL (H)). Liver Function Tests:  Recent Labs Lab 02/12/17 0534  AST 16  ALT 14  ALKPHOS 59  BILITOT 1.5*  PROT 6.0*  ALBUMIN 3.2*   No results for input(s): LIPASE, AMYLASE in the last 168 hours. No results for input(s): AMMONIA in the last 168 hours. Coagulation Profile:  Recent Labs Lab 02/14/17 1841  INR 1.15   Cardiac Enzymes:  Recent Labs Lab 02/15/17 0645 02/15/17 1140  TROPONINI 0.07* 0.06*   BNP (last 3 results) No results for input(s): PROBNP in the last 8760 hours. HbA1C: No results for input(s): HGBA1C in the last 72 hours. CBG: No results for input(s): GLUCAP in the last 168 hours. Lipid Profile: No results for input(s): CHOL, HDL, LDLCALC, TRIG, CHOLHDL, LDLDIRECT in the last 72 hours. Thyroid Function Tests: No results for input(s): TSH, T4TOTAL, FREET4, T3FREE, THYROIDAB in the last 72 hours. Anemia Panel: No results for input(s): VITAMINB12, FOLATE, FERRITIN, TIBC, IRON, RETICCTPCT in the last 72 hours. Urine analysis:    Component Value Date/Time   COLORURINE YELLOW 02/14/2017 2340   APPEARANCEUR HAZY (A) 02/14/2017 2340   LABSPEC 1.017 02/14/2017 2340   PHURINE 5.0 02/14/2017 2340   GLUCOSEU NEGATIVE 02/14/2017 2340   HGBUR SMALL (A) 02/14/2017 2340   BILIRUBINUR NEGATIVE 02/14/2017 2340   KETONESUR 5 (A) 02/14/2017 2340   PROTEINUR NEGATIVE 02/14/2017 2340   UROBILINOGEN 0.2 06/27/2009 1452   NITRITE NEGATIVE 02/14/2017 2340   LEUKOCYTESUR NEGATIVE 02/14/2017 2340   )No results found for this or any previous visit (from the past 240 hour(s)).   Anti-infectives    None     Radiology Studies: No results found.  Scheduled Meds: . [MAR Hold] aspirin  325 mg Oral Daily  . [MAR Hold] clopidogrel  75 mg Oral Daily  . [MAR Hold] divalproex  500 mg Oral QHS  . [MAR Hold] feeding supplement (ENSURE ENLIVE)  237 mL Oral BID BM  . [MAR Hold] furosemide  20 mg  Oral Daily  . [MAR Hold] latanoprost  1 drop Both Eyes QHS  . [MAR Hold] metoprolol tartrate  12.5 mg Oral BID  . [MAR Hold] potassium chloride  20 mEq Oral Daily  . [MAR Hold] pravastatin  80 mg Oral QHS   Continuous Infusions: . sodium chloride 20 mL/hr at 02/18/17 0647    LOS: 3 days   Time spent: 25 min  Irwin Brakeman, MD [facility] Pager [phone]  If 7PM-7AM, please contact night-coverage [email or web address] Password [facility] 02/18/2017, 11:47 AM

## 2017-02-18 NOTE — Patient Outreach (Signed)
[facility]) Care Management  02/18/2017  SHARIAN DELIA 10-02-1945 [id number]  EMMI:  Stroke Referral date: 02/18/17 Referral source: EMMI stroke red alert Referral reason: New worsening symptoms: YES,  New worsening pain/ fever/shortness of breath; YES Day # 3  Telephone call to patient. Discussed EMMI stroke program with patient. Patient states she is still in the hospital. Patient states she was discharged from the hospital on last Saturday but did not feel well on Monday, went back to the hospital and was readmitted. Patient states she is suppose to go home today. Patient verbally agreed to received EMMI stroke program automated calls.   PLAN: RNCM will contact care management assistant to restart EMMI stroke automated calls status post patients current hospital discharge.  Quinn Plowman RN,BSN,CCM [facility] Telephonic  [phone]

## 2017-02-18 NOTE — Discharge Summary (Signed)
Physician Discharge Summary  Katie [name] YCX:[id number] DOB: 12-18-1945 DOA: 02/14/2017  PCP: Seward Carol, MD  Admit date: 02/14/2017 Discharge date: 02/18/2017  Admitted From: Home  Disposition: Home   Recommendations for Outpatient Follow-up:  1. Follow up with PCP in 1 weeks 2. Follow up with Dr. Leonie Man in 6 weeks as scheduled.  3. Follow up with cardiology as scheduled for loop recorder reading and removal.  4. Please obtain BMP/CBC in one week  Discharge Condition: STABLE   CODE STATUS: FULL    Brief Hospitalization Summary: Please see all hospital notes, images, labs for full details of the hospitalization.  Chief Complaint: Chest Pain.  HPI: Katie [name] is a 71 y.o. female with medical history significant of stage III chronic kidney disease, history of renal cell carcinoma with right nephrectomy in 2007, hyperlipidemia, hypertension, moderate mitral regurgitation, pulmonary hypertension tricuspid regurgitation who was recently admitted and discharged for CVA who is now returning for exertional chest pain, non radiated, pressure like associated with dry cough, dyspnea and lower extremities edema for the past two days. She denies fever, chills, sore throat, palpitations, dizziness or diaphoresis. She denies abdominal pain, nausea, emesis, diarrhea, constipation, melena or hematochezia. She denies dysuria, frequency or hematuria.  ED Course: Workup in the emergency department showed WBC of 3.9, hemoglobin 12.6 g/dL and platelets 258. Sodium 140, potassium 3.9, chloride 108, bicarbonate 23 mmol/L. BUN was 18, creatinine 1.51 and glucose 101 mg/dL. Troponin level was 0.08 ng/mL and BNP 538 pg/mL.  Imaging: Chest radiograph showed cardiomegaly. Repeat CT scan of the brain did not show any new abnormalities.   Assessment & Plan:   Principal Problem:   Chest pain Active Problems:   Elevated troponin   Hyperlipidemia   Cerebellar stroke   Hypertension   Acute on chronic  diastolic CHF (congestive heart failure)   Chronic migraine without aura without status migrainosus, not intractable  Chest pain -suspect due to volume overload vs GI -improved with PPI  Acute on chronic diastolic CHF (congestive heart failure) Change to PO lasix 20 mg daily  -follow BMP in AM -lasix was held during last admission so suspect volume accumulated from that and will keep patient on her previous lasix dose now that she has been diuresed  Headache initially worse but not improved with PRN fiorcet, neuro added depakote q PM -appreciate neurology input as patient just had CVA -CT scan does not show bleeding  Elevated troponin Trending down -suspect elevated due to volume overload  Hyperlipidemia Continue pravastatin 80 mg by mouth daily  History of cerebellar stroke  ASA 325 +plavix x 3 months then plavix alone Initially patient did not want TEE/loop in hospital but today changed mind, neuro supports inpatient TEE/loop.  Pt had TEE and loop recorder was placed 7/20.  TEE did not show any significant findings to explain symptoms.  See full report.  Loop recorder was placed without complications and patient tolerated procedure well.    Stroke Team Notes:  71 y.o.femalewith history of CHF, renal cancer s/p R nephrectomy 2007, stage III CKD, HTN, HLD, and just discharged on 02/12/17 for strokewho was admitted again for acute on chronic heart failure and was treated for such. However, pt continues to complain of HA since last admission from 02/10/17 to 02/12/17. 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 She was put on dual antiplatelet for 3 months and then Plavix alone. She was also continued on  pravastatin 80 mg daily. She often for outpatient TEE and Loop recorder. During the admission, she continued complaining of headache, CT 02/14/17 no hydrocephalus.  Stroke: left SCA small  infarction due to absent flow at distal BA and both SCAs and both PCAs, embolic pattern, etiology not clear   Resultant no deficit  CT head: Low density seen in superior portion of the left cerebellar hemisphere concerning for infarction of indeterminate age.   MR brain - 2 cm acute infarction affecting the superior cerebellum on the left. Possible punctate acute infarction in the right cerebellum.   MRA head:  Absence of flow at the distal basilar most consistent with embolic occlusion. Patent posterior communicating artery on the right gives supply to the right posterior cerebral artery. Smaller posterior communicating artery on the left supplies the left posterior cerebral artery.   DSA - distal BA occlusion with occluded b/l SCAs and PCAs   Repeat CT head 02/14/17 no acute changes, no hydrocephalus  2D Echo EF 55-60%  Since pt admitted this time, will recommend TEE and loop recorder while as inpt  LDL 88  HgbA1c 5.6  SCDs for VTE prophylaxis  Diet Heart Room service appropriate? Yes; Fluid consistency: Thin; Fluid restriction: 1200 mL Fluid  Diet - low sodium heart healthy  Diet NPO time specified  aspirin 81 mg daily prior to admission, now on aspirin 325 mg daily and plavix 75mg . Continue DAPT for 3 months and then plavix alone.   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  Hyperlipidemia  Home meds:  Pravastatin 80mg  PO daily, resumed in hospital  LDL 88 goal < 70  Continue statin at discharge  Other Stroke Risk Factors  Advanced age  Family hx stroke (Brother)  Other Active Problems  History of renal carcinoma   Hypertension Monitor blood pressure.  Follow up with PCP and cardiologist.   DVT prophylaxis:  SCD's  Code Status: Full Code  Family Communication:  bedside  Disposition Plan:  Home after TEE/loop   Consultants:   Cardiology/neurology   Discharge Diagnoses:  Principal Problem:   Chest pain Active Problems:   Elevated troponin   Hyperlipidemia   Cerebellar stroke ([place])   Hypertension   Acute on chronic diastolic CHF (congestive heart failure) (H[facility])   Chronic migraine without aura without status migrainosus, not intractable    Discharge Instructions: Discharge Instructions    (HEART FAILURE PATIENTS) Call MD:  Anytime you have any of the following symptoms: 1) 3 pound weight gain in 24 hours or 5 pounds in 1 week 2) shortness of breath, with or without a dry hacking cough 3) swelling in the hands, feet or stomach 4) if you have to sleep on extra pillows at night in order to breathe.    Complete by:  As directed    Call MD for:  difficulty breathing, headache or visual disturbances    Complete  by:  As directed    Call MD for:  extreme fatigue    Complete by:  As directed    Call MD for:  persistant nausea and vomiting    Complete by:  As directed    Call MD for:  severe uncontrolled pain    Complete by:  As directed    Diet - low sodium heart healthy    Complete by:  As directed    Discharge instructions    Complete by:  As directed    BMP 1 week Can use OTC PPI such as prilosec for acid reflux as needed-- will give a prescription for protonix but OTC may be cheaper   Increase activity slowly    Complete by:  As directed    Increase activity slowly    Complete by:  As directed       Allergies as of 02/18/2017      Reactions   Tramadol Shortness Of Breath, Palpitations, Other (See Comments)      Medication List    STOP taking these medications   aspirin EC 81 MG tablet Replaced by:  aspirin 325 MG tablet     TAKE these medications   acetaminophen 500 MG tablet Commonly known as:  TYLENOL Take 1,000 mg by mouth every 6 (six) hours as needed for headache (pain).   aspirin 325 MG tablet Take 1 tablet (325 mg total) by mouth daily. Replaces:  aspirin EC 81 MG tablet   butalbital-acetaminophen-caffeine 50-325-40 MG tablet Commonly known as:  FIORICET, ESGIC Take 1 tablet by mouth every 12 (twelve) hours as needed for headache.   clopidogrel 75 MG tablet Commonly known as:  PLAVIX Take 1 tablet (75 mg total) by mouth daily.   divalproex 500 MG DR tablet Commonly known as:  DEPAKOTE Take 1 tablet (500 mg total) by mouth at bedtime.   feeding supplement (ENSURE ENLIVE) Liqd Take 237 mLs by mouth 2 (two) times daily between meals.   furosemide 20 MG tablet Commonly known as:  LASIX Take 20 mg by mouth daily.   latanoprost 0.005 % ophthalmic solution Commonly known as:  XALATAN Place 1 drop into both eyes at bedtime.   metoprolol tartrate 25 MG tablet Commonly known as:  LOPRESSOR Take 0.5 tablets (12.5 mg total) by mouth 2 (two) times daily.   pantoprazole 40 MG tablet Commonly known as:  PROTONIX Take 1 tablet (40 mg total) by mouth daily.   potassium chloride 10 MEQ CR capsule Commonly known as:  MICRO-K Take 2 capsules (20 mEq total) by mouth daily.   pravastatin 80 MG tablet Commonly known as:  PRAVACHOL Take 80 mg by mouth at bedtime.      Follow-up Information    Seward Carol, MD Follow up in 1 week(s).   Specialty:  Internal Medicine Contact information: [address] [place] [address] [phone]        keep prior appointment with neurology and cardiology Follow up.        [facility]  Office Follow up on 02/28/2017.   Specialty:  Cardiology Why:  10:00AM, wound check Contact information: [address], [place] [address] [phone]         Allergies  Allergen Reactions  . Tramadol Shortness Of Breath, Palpitations and Other (See Comments)   Current Discharge Medication List    START taking these medications   Details  aspirin 325 MG tablet Take 1 tablet (325 mg total)  by mouth daily.    butalbital-acetaminophen-caffeine (FIORICET, ESGIC) 50-325-40 MG tablet Take 1 tablet by mouth every 12 (twelve) hours as needed for headache. Qty: 14 tablet, Refills: 0    divalproex (DEPAKOTE) 500 MG DR tablet Take 1 tablet (500 mg total) by mouth at bedtime. Qty: 30 tablet, Refills: 0    feeding supplement, ENSURE ENLIVE, (ENSURE ENLIVE) LIQD Take 237 mLs by mouth 2 (two) times daily between meals. Qty: 237 mL, Refills: 0    pantoprazole (PROTONIX) 40 MG tablet Take 1 tablet (40 mg total) by mouth daily. Qty: 30 tablet, Refills: 0      CONTINUE these medications which have NOT CHANGED   Details  acetaminophen (TYLENOL) 500 MG tablet Take 1,000 mg by mouth every 6 (six) hours as needed for headache (pain).    clopidogrel (PLAVIX) 75 MG tablet Take 1 tablet (75 mg total) by mouth daily. Qty: 30 tablet, Refills: 1    furosemide (LASIX) 20 MG tablet Take 20 mg by mouth daily.  Refills: 0    latanoprost (XALATAN) 0.005 % ophthalmic solution Place 1 drop into both eyes at bedtime. Refills: 0    metoprolol tartrate (LOPRESSOR) 25 MG tablet Take 0.5 tablets (12.5 mg total) by mouth 2 (two) times daily. Qty: 30 tablet, Refills: 8    potassium chloride (MICRO-K) 10 MEQ CR capsule Take 2 capsules (20 mEq total) by mouth daily. Qty: 60 capsule, Refills: 11    pravastatin (PRAVACHOL) 80 MG tablet Take 80 mg by mouth at bedtime.       STOP taking these medications     aspirin EC 81 MG tablet         Procedures/Studies: Dg Chest 2  View  Result Date: 02/14/2017 CLINICAL DATA:  Pain with lying down x1 week. History of stroke last week. EXAM: CHEST  2 VIEW COMPARISON:  02/10/2017 and 08/20/2016 FINDINGS: There is cardiomegaly that appears stable from recent 02/11/2017 comparison but increased in size CT from 08/19/2016. The presence of a pericardial effusion is not entirely excluded. No aortic aneurysm. Trace tiny effusions blunting the costophrenic angles bilaterally. No overt pulmonary edema, pneumothorax or pulmonary consolidations. Minimal bibasilar atelectasis. No acute nor suspicious osseous abnormalities. Surgical clips are seen in the right upper quadrant. IMPRESSION: 1. Cardiomegaly, increased from 08/20/2016 radiographs. Question pericardial effusion or a cardiomyopathy accounting for this appearance. 2. Aortic atherosclerosis without aneurysm. 3. Trace bilateral pleural effusions. Electronically Signed   By: Ashley Royalty M.D.   On: 02/14/2017 19:24   Ct Head Wo Contrast  Result Date: 02/14/2017 CLINICAL DATA:  Initial evaluation for acute headache with leg weakness. History of recent stroke, status post tPA. EXAM: CT HEAD WITHOUT CONTRAST TECHNIQUE: Contiguous axial images were obtained from the base of the skull through the vertex without intravenous contrast. COMPARISON:  Prior MRI and CT from 02/10/2017. FINDINGS: Brain: Evolving 2 cm subacute ischemic infarct involving the superior left cerebellar hemisphere is seen, now more well-defined as compared to previous study. No significant mass effect. No evidence for hemorrhagic transformation. Small focus of encephalomalacia at the posterior left sylvian filter also unchanged. No acute intracranial hemorrhage. No evidence for acute large vessel territory infarct. No mass lesion, midline shift or mass effect. No hydrocephalus. No extra-axial fluid collection. Vascular: Asymmetric hyperdensity at the basilar tip may be related to previous identified basilar tip thrombus, similar to  previous. Skull: Scalp soft tissues and calvarium within normal limits. Sinuses/Orbits: Globes and orbital soft tissues within normal limits. Paranasal sinuses and mastoids are  clear. IMPRESSION: 1. Continued interval evolution of subacute 2 cm left superior cerebellar infarct. No significant mass effect or evidence for hemorrhagic transformation. 2. Asymmetric hyperdensity at the basilar tip, suspected be related to recently identified basilar tip thrombus, better evaluated on recent catheter directed arteriogram. 3. No other new acute intracranial process identified. Electronically Signed   By: Jeannine Boga M.D.   On: 02/14/2017 22:09   Ct Head Wo Contrast  Result Date: 02/10/2017 CLINICAL DATA:  Headache, dizziness, blurry vision. EXAM: CT HEAD WITHOUT CONTRAST TECHNIQUE: Contiguous axial images were obtained from the base of the skull through the vertex without intravenous contrast. COMPARISON:  CT scan of July 21, 2016. FINDINGS: Brain: Mild chronic ischemic white matter disease is noted. No mass effect or midline shift is noted. Ventricular size is within normal limits. There is interval development of ill-defined low density in the left cerebellar hemisphere concerning for infarction of indeterminate age. No hemorrhage or mass lesion is noted. Vascular: No hyperdense vessel or unexpected calcification. Skull: Normal. Negative for fracture or focal lesion. Sinuses/Orbits: No acute finding. Other: None. IMPRESSION: Mild chronic ischemic white matter disease. Low density seen in superior portion of the left cerebellar hemisphere concerning for infarction of indeterminate age. MRI may be performed for further evaluation. Electronically Signed   By: Marijo Conception, M.D.   On: 02/10/2017 10:01   Mr Angiogram Head Wo Contrast  Result Date: 02/10/2017 CLINICAL DATA:  Acute presentation with headache. Acute superior cerebellar infarction. EXAM: MRA HEAD WITHOUT CONTRAST TECHNIQUE: Angiographic  images of the Circle of Willis were obtained using MRA technique without intravenous contrast. COMPARISON:  Earlier same day FINDINGS: Both internal carotid arteries are widely patent through the skullbase and siphon regions. The anterior and middle cerebral vessels are patent without proximal stenosis, aneurysm or vascular malformation. Both vertebral arteries are widely patent to the basilar. No proximal basilar stenosis. There is an absence of flow at the distal basilar artery. There is a large posterior communicating artery on the right supplying the right posterior cerebral artery. Some flow is present in the left PCA territory, though less pronounced. There is probably a small posterior communicating artery on the left. There is probably some flow in the right superior cerebellar artery. No flow is seen in the left superior cerebellar artery. IMPRESSION: No anterior circulation abnormal finding. Absence of flow at the distal basilar most consistent with embolic occlusion. Patent posterior communicating artery on the right gives supply to the right posterior cerebral artery. Smaller posterior communicating artery on the left supplies the left posterior cerebral artery. I think there is probably some flow in the right superior cerebellar artery but no demonstrated flow in the left superior cerebellar artery. Electronically Signed   By: Nelson Chimes M.D.   On: 02/10/2017 15:56   Mr Brain Wo Contrast (neuro Protocol)  Result Date: 02/10/2017 CLINICAL DATA:  Acute presentation with severe headache. EXAM: MRI HEAD WITHOUT CONTRAST TECHNIQUE: Multiplanar, multiecho pulse sequences of the brain and surrounding structures were obtained without intravenous contrast. COMPARISON:  CT same day. FINDINGS: Brain: 2 cm region of acute infarction demonstrated in the left cerebellum. Punctate focus of acute infarction in the right cerebellum. Cerebral hemispheres do not show any acute finding. The brainstem is normal.  Cerebral hemispheres otherwise show minimal small vessel change of the white matter. No sign of neoplastic mass lesion. No hydrocephalus or extra-axial collection. Vascular: Major vessels at the base of the brain show flow. Skull and upper cervical spine: Negative Sinuses/Orbits:  Clear/normal Other: None significant IMPRESSION: 2 cm acute infarction affecting the superior cerebellum on the left. Possible punctate acute infarction in the right cerebellum. Electronically Signed   By: Nelson Chimes M.D.   On: 02/10/2017 13:29   Dg Chest Port 1 View  Result Date: 02/10/2017 CLINICAL DATA:  Headache with photophobia EXAM: PORTABLE CHEST 1 VIEW COMPARISON:  August 10, 2016 FINDINGS: There is no edema or consolidation. Heart is mildly enlarged with pulmonary vascularity within normal limits. No adenopathy. There is degenerative change in the thoracic spine. IMPRESSION: Cardiomegaly.  No edema or consolidation. Electronically Signed   By: Lowella Grip III M.D.   On: 02/10/2017 14:43   Ir Angio Intra Extracran Sel Com Carotid Innominate Bilat Mod Sed  Result Date: 02/14/2017 INDICATION: Transient diplopia with blurring. Abnormal MRA of the brain and MRI brain. EXAM: BILATERAL COMMON CAROTID AND INNOMINATE ANGIOGRAPHY AND BILATERAL VERTEBRAL ARTERY ANGIOGRAMS MEDICATIONS: No antibiotic was administered within 1 hour of the procedure. ANESTHESIA/SEDATION: Versed 2 mg IV; Fentanyl 25 mcg IV. Moderate Sedation Time:  30 minutes The patient was continuously monitored during the procedure by the interventional radiology nurse under my direct supervision. FLUOROSCOPY TIME:  Fluoroscopy Time: 8 minutes 42 seconds (541 MGy). COMPLICATIONS: None immediate. TECHNIQUE: Informed written consent was obtained from the patient after a thorough discussion of the procedural risks, benefits and alternatives. All questions were addressed. Maximal Sterile Barrier Technique was utilized including caps, mask, sterile gowns, sterile  gloves, sterile drape, hand hygiene and skin antiseptic. A timeout was performed prior to the initiation of the procedure. PROCEDURE: The right groin was prepped and draped in the usual sterile fashion. Thereafter using modified Seldinger technique, transfemoral access into the right common femoral artery was obtained without difficulty. Over a 0.035 inch guidewire, a 5 French Pinnacle sheath was inserted. Through this, and also over 0.035 inch guidewire, a 5 French JB1 catheter was advanced to the aortic arch region and selectively positioned in the right common carotid artery, the right vertebral artery, the left common carotid artery and the left vertebral artery. FINDINGS: The right vertebral origin is widely patent. The vessel is seen to opacify normally to the cranial skull base. Normal opacification is seen of the right posterior-inferior cerebellar artery and the right vertebrobasilar junction. The basilar artery demonstrates wide patency in its proximal 2/3 with opacification of the anterior-inferior cerebellar arteries bilaterally. There is a large filling defect at the basilar apex with near complete occlusion of the superior cerebellar arteries and the posterior cerebral arteries bilaterally proximally. Thin caliber contrast is noted in the proximal posterior cerebral arteries bilaterally. There is extensive small vasculature in the medial and lateral thalamoperforator regions bilaterally best seen on the lateral DSA sequences. Also noted is retrograde opacification via the left posterior inferior cerebellar artery leptomeningeal collaterals opacifying the lateral aspect of the left cerebral hemisphere with subsequent opacification of the left superior cerebellar artery. Also demonstrated is retrograde opacification of the left vertebrobasilar junction from the right vertebral artery injection. Mild smooth focal outpouchings are seen in the distal right vertebral artery at the level of C1 representing  mild FMD-like changes. Left posterior-inferior cerebellar artery anterior-inferior cerebellar artery complex is noted. There is antegrade clearance of contrast from the left vertebrobasilar junction. The right common carotid arteriogram demonstrates the right external carotid artery and its major branches to be widely patent. The right internal carotid artery at the bulb to the cranial skull base opacifies normally. The petrous, the cavernous and the supraclinoid segments are widely  patent. The right middle cerebral artery and the anterior cerebral artery opacify into the capillary and venous phases. Also demonstrated is a prominent right posterior communicating artery opacifying the right posterior cerebral artery distribution. The left common carotid arteriogram demonstrates the left external carotid artery and its major branches to be widely patent. The left internal carotid artery at the bulb to the cranial skull base opacifies normally. The petrous, the cavernous and the supraclinoid segments are widely patent. A left posterior communicating artery is seen opacifying the left posterior cerebral artery distribution. The left middle cerebral artery and the left anterior cerebral artery opacify into the capillary and venous phases. The left vertebral artery origin is widely patent. The vessel is seen to demonstrate mild tortuosity in its proximal 1/3. However, more distally the vessel is seen to opacify to the left vertebrobasilar junction and the left posterior-inferior cerebellar artery. The distal left vertebrobasilar junction is seen to opacify the proximal basilar artery to the level of the anterior-inferior cerebellar arteries. Mixing of non-opacified blood from the more dominant right vertebral artery is noted in the basilar artery. Again demonstrated on the lateral projection is the leptomeningeal collateralization from the distal branches of the left posterior-inferior cerebellar artery opacifying the  left superior cerebellar artery territory. IMPRESSION: Large filling defect noted in the basilar apex with near complete occlusion of both the superior cerebellar arteries and the posterior cerebral arteries proximally. Extensive lateral and medial thalamoperforators noted bilaterally. Mild FMD changes noted in the distal right vertebral artery at the level of C1. Findings reviewed with the patient and the referring neurologist. Patient to follow-up in the clinic in 2-3 months after MRI of brain and MRA brain. Electronically Signed   By: Luanne Bras M.D.   On: 02/12/2017 15:21   Ir Angio Vertebral Sel Vertebral Bilat Mod Sed  Result Date: 02/14/2017 INDICATION: Transient diplopia with blurring. Abnormal MRA of the brain and MRI brain. EXAM: BILATERAL COMMON CAROTID AND INNOMINATE ANGIOGRAPHY AND BILATERAL VERTEBRAL ARTERY ANGIOGRAMS MEDICATIONS: No antibiotic was administered within 1 hour of the procedure. ANESTHESIA/SEDATION: Versed 2 mg IV; Fentanyl 25 mcg IV. Moderate Sedation Time:  30 minutes The patient was continuously monitored during the procedure by the interventional radiology nurse under my direct supervision. FLUOROSCOPY TIME:  Fluoroscopy Time: 8 minutes 42 seconds (541 MGy). COMPLICATIONS: None immediate. TECHNIQUE: Informed written consent was obtained from the patient after a thorough discussion of the procedural risks, benefits and alternatives. All questions were addressed. Maximal Sterile Barrier Technique was utilized including caps, mask, sterile gowns, sterile gloves, sterile drape, hand hygiene and skin antiseptic. A timeout was performed prior to the initiation of the procedure. PROCEDURE: The right groin was prepped and draped in the usual sterile fashion. Thereafter using modified Seldinger technique, transfemoral access into the right common femoral artery was obtained without difficulty. Over a 0.035 inch guidewire, a 5 French Pinnacle sheath was inserted. Through this, and  also over 0.035 inch guidewire, a 5 French JB1 catheter was advanced to the aortic arch region and selectively positioned in the right common carotid artery, the right vertebral artery, the left common carotid artery and the left vertebral artery. FINDINGS: The right vertebral origin is widely patent. The vessel is seen to opacify normally to the cranial skull base. Normal opacification is seen of the right posterior-inferior cerebellar artery and the right vertebrobasilar junction. The basilar artery demonstrates wide patency in its proximal 2/3 with opacification of the anterior-inferior cerebellar arteries bilaterally. There is a large filling defect at  the basilar apex with near complete occlusion of the superior cerebellar arteries and the posterior cerebral arteries bilaterally proximally. Thin caliber contrast is noted in the proximal posterior cerebral arteries bilaterally. There is extensive small vasculature in the medial and lateral thalamoperforator regions bilaterally best seen on the lateral DSA sequences. Also noted is retrograde opacification via the left posterior inferior cerebellar artery leptomeningeal collaterals opacifying the lateral aspect of the left cerebral hemisphere with subsequent opacification of the left superior cerebellar artery. Also demonstrated is retrograde opacification of the left vertebrobasilar junction from the right vertebral artery injection. Mild smooth focal outpouchings are seen in the distal right vertebral artery at the level of C1 representing mild FMD-like changes. Left posterior-inferior cerebellar artery anterior-inferior cerebellar artery complex is noted. There is antegrade clearance of contrast from the left vertebrobasilar junction. The right common carotid arteriogram demonstrates the right external carotid artery and its major branches to be widely patent. The right internal carotid artery at the bulb to the cranial skull base opacifies normally. The  petrous, the cavernous and the supraclinoid segments are widely patent. The right middle cerebral artery and the anterior cerebral artery opacify into the capillary and venous phases. Also demonstrated is a prominent right posterior communicating artery opacifying the right posterior cerebral artery distribution. The left common carotid arteriogram demonstrates the left external carotid artery and its major branches to be widely patent. The left internal carotid artery at the bulb to the cranial skull base opacifies normally. The petrous, the cavernous and the supraclinoid segments are widely patent. A left posterior communicating artery is seen opacifying the left posterior cerebral artery distribution. The left middle cerebral artery and the left anterior cerebral artery opacify into the capillary and venous phases. The left vertebral artery origin is widely patent. The vessel is seen to demonstrate mild tortuosity in its proximal 1/3. However, more distally the vessel is seen to opacify to the left vertebrobasilar junction and the left posterior-inferior cerebellar artery. The distal left vertebrobasilar junction is seen to opacify the proximal basilar artery to the level of the anterior-inferior cerebellar arteries. Mixing of non-opacified blood from the more dominant right vertebral artery is noted in the basilar artery. Again demonstrated on the lateral projection is the leptomeningeal collateralization from the distal branches of the left posterior-inferior cerebellar artery opacifying the left superior cerebellar artery territory. IMPRESSION: Large filling defect noted in the basilar apex with near complete occlusion of both the superior cerebellar arteries and the posterior cerebral arteries proximally. Extensive lateral and medial thalamoperforators noted bilaterally. Mild FMD changes noted in the distal right vertebral artery at the level of C1. Findings reviewed with the patient and the referring  neurologist. Patient to follow-up in the clinic in 2-3 months after MRI of brain and MRA brain. Electronically Signed   By: Luanne Bras M.D.   On: 02/12/2017 15:21      Subjective: Pt tolerated TEE and loop recorder placement, says headache is better now.   Discharge Exam: Vitals:   02/18/17 1343 02/18/17 1354  BP: 114/73 114/73  Pulse: 71 65  Resp:  16  Temp:  97.8 F (36.6 C)   Vitals:   02/18/17 1305 02/18/17 1315 02/18/17 1343 02/18/17 1354  BP: (!) 101/56 104/67 114/73 114/73  Pulse: 66 69 71 65  Resp: 18 16  16   Temp:    97.8 F (36.6 C)  TempSrc:    Oral  SpO2: 98% 100%  100%  Weight:      Height:  General exam: NAD cooperative, awake, alert.  Respiratory system: clear to auscultation, no crackles, no wheezing Cardiovascular system: normal s1, s2 sounds, no mrg Gastrointestinal system: +BS, soft Central nervous system: Alert Extremities: moves all 4 ext Skin: No rashes, lesions or ulcers  The results of significant diagnostics from this hospitalization (including imaging, microbiology, ancillary and laboratory) are listed below for reference.     Microbiology: No results found for this or any previous visit (from the past 240 hour(s)).   Labs: BNP (last 3 results)  Recent Labs  08/20/16 2030 02/14/17 1841  BNP 166.9* 784.6*   Basic Metabolic Panel:  Recent Labs Lab 02/12/17 0534 02/14/17 1841 02/16/17 0252 02/17/17 0213 02/18/17 0255  NA 140 140 138 140 140  K 3.7 3.9 3.8 4.0 4.2  CL 111 108 103 106 107  CO2 23 23 26 27 26   GLUCOSE 77 101* 104* 98 89  BUN 15 18 22* 26* 22*  CREATININE 1.23* 1.51* 1.42* 1.55* 1.53*  CALCIUM 8.9 9.6 9.3 9.2 9.4   Liver Function Tests:  Recent Labs Lab 02/12/17 0534  AST 16  ALT 14  ALKPHOS 59  BILITOT 1.5*  PROT 6.0*  ALBUMIN 3.2*   No results for input(s): LIPASE, AMYLASE in the last 168 hours. No results for input(s): AMMONIA in the last 168 hours. CBC:  Recent Labs Lab  02/14/17 1841  WBC 3.9*  HGB 12.6  HCT 38.6  MCV 90.8  PLT 258   Cardiac Enzymes:  Recent Labs Lab 02/15/17 0645 02/15/17 1140  TROPONINI 0.07* 0.06*   BNP: Invalid input(s): POCBNP CBG: No results for input(s): GLUCAP in the last 168 hours. D-Dimer No results for input(s): DDIMER in the last 72 hours. Hgb A1c No results for input(s): HGBA1C in the last 72 hours. Lipid Profile No results for input(s): CHOL, HDL, LDLCALC, TRIG, CHOLHDL, LDLDIRECT in the last 72 hours. Thyroid function studies No results for input(s): TSH, T4TOTAL, T3FREE, THYROIDAB in the last 72 hours.  Invalid input(s): FREET3 Anemia work up No results for input(s): VITAMINB12, FOLATE, FERRITIN, TIBC, IRON, RETICCTPCT in the last 72 hours. Urinalysis    Component Value Date/Time   COLORURINE YELLOW 02/14/2017 2340   APPEARANCEUR HAZY (A) 02/14/2017 2340   LABSPEC 1.017 02/14/2017 2340   PHURINE 5.0 02/14/2017 2340   GLUCOSEU NEGATIVE 02/14/2017 2340   HGBUR SMALL (A) 02/14/2017 2340   BILIRUBINUR NEGATIVE 02/14/2017 2340   KETONESUR 5 (A) 02/14/2017 2340   PROTEINUR NEGATIVE 02/14/2017 2340   UROBILINOGEN 0.2 06/27/2009 1452   NITRITE NEGATIVE 02/14/2017 2340   LEUKOCYTESUR NEGATIVE 02/14/2017 2340   Sepsis Labs Invalid input(s): PROCALCITONIN,  WBC,  LACTICIDVEN Microbiology No results found for this or any previous visit (from the past 240 hour(s)).  Time coordinating discharge:  33 minutes  SIGNED:  Irwin Brakeman, MD  [facility] 02/18/2017, 3:40 PM Pager [phone]  If 7PM-7AM, please contact night-coverage [email or web address] Password [email or web address]

## 2017-02-18 NOTE — H&P (View-Only) (Signed)
Cardiology Consultation:   Patient ID: Katie [name]; [id number]; 10/05/1945   Admit date: 02/14/2017 Date of Consult: 02/18/2017  Primary Care Provider: Seward Carol, MD Primary Cardiologist: Dr. Acie Fredrickson Primary Electrophysiologist:  New to Dr. Curt Bears   Patient Profile:   Katie [name] is a 71 y.o. female with a hx of chronic CHF (iastolic), CRI (III), single kidney (R nephrectomy 2/2 ca), HTN, HLD who is being seen today for the evaluation of ILR s/p CVA at the request of Dr. Erlinda Hong.  History of Present Illness:   [name] was admitted to [facility] 02/14/17 with c/o CP/SOB, felt to have CHF exacerbation and treated with IV diuresis, she had just been discharged after an acute ctroke, admitted then 02/10/17 to 02/12/17 for HA. Stroke workup found acute infarct affecting the superior cerebellum on the left. 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 Due to baseline occlusion concerning for embolic pattern, she was recommended for TEE and Loop recorder and planned for out patient.  Neuro on board for c/o HA, though this morning is not an active complaint, she denies any CP or SOB at current and tells me she is planned for discharge today once testing completed.  The patient denies any kind of hx of palpitations, no dizziness,  Near syncope or syncope, she has no known hx of AF by her account or review of her chart.  Neuro noted her stroke a week ago was left SCA small infarction due to absent flow at distal BA and both SCAs and both PCAs, embolic pattern, etiology not clear.  The patient feels like she is doing well from this standpoint, HA was felt by neuro to bemigrain, not stroke associated.    Stroke work up included below, as well as angio of carotid with her IR procedure last week.  Since the patient was readmitted plans for TEE and ILR were requested.  Labs WBC 3.9 H/H 12/38 plts 258 poc Trop 0.08, 0.10 Trop I: 0.07,  0.06 K+ 4.2 BUN/Creat 22/1.53  Past Medical History:  Diagnosis Date  . 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 History of kidney cancer    a. s/p right nephrectomy ~2007.  Marland Kitchen Hypercholesterolemia   . Hypertension   . Moderate mitral regurgitation 01/08/2016  . Moderate to severe pulmonary hypertension ([place]) 01/08/2016  . Moderate tricuspid regurgitation 01/08/2016  . Stroke [facility])     Past Surgical History:  Procedure Laterality Date  . CARDIAC CATHETERIZATION N/A 01/12/2016   Procedure: Right/Left Heart Cath and Coronary/Graft Angiography;  Surgeon: Belva Crome, MD;  Location: [place] CV LAB;  Service: Cardiovascular;  Laterality: N/A;  . CERVICAL POLYPECTOMY N/A 04/23/2013   Procedure: CERVICAL POLYPECTOMY;  Surgeon: Maeola Sarah. Landry Mellow, MD;  Location: [place] [facility];  Service: Gynecology;  Laterality: N/A;  Possible Polypectomy  . HYSTEROSCOPY W/D&C N/A 04/23/2013   Procedure: DILATATION AND CURETTAGE /HYSTEROSCOPY;  Surgeon: Maeola Sarah. Landry Mellow, MD;  Location: [facility];  Service: Gynecology;  Laterality: N/A;  . IR ANGIO INTRA EXTRACRAN SEL COM CAROTID INNOMINATE BILAT MOD SED  02/11/2017  . IR ANGIO VERTEBRAL SEL VERTEBRAL BILAT MOD SED  02/11/2017  . KNEE ARTHROSCOPY Left   . NEPHRECTOMY Right    due to carcinoma     Inpatient Medications: Scheduled Meds: . aspirin  325 mg Oral Daily  . clopidogrel  75 mg Oral Daily  . divalproex  500 mg Oral QHS  . feeding supplement (ENSURE ENLIVE)  237 mL Oral BID BM  . furosemide  20 mg Oral Daily  . latanoprost  1 drop Both Eyes QHS  . metoprolol tartrate  12.5 mg Oral BID  . potassium chloride  20 mEq Oral Daily  . pravastatin  80 mg Oral QHS   Continuous Infusions: . sodium chloride 20 mL/hr at 02/18/17 0647   PRN Meds: acetaminophen, ALPRAZolam, butalbital-acetaminophen-caffeine, nitroGLYCERIN, ondansetron (ZOFRAN) IV  Allergies:    Allergies  Allergen Reactions  . Tramadol Shortness Of Breath,  Palpitations and Other (See Comments)    Social History:   Social History   Social History  . Marital status: Divorced    Spouse name: N/A  . Number of children: N/A  . Years of education: N/A   Occupational History  . Not on file.   Social History Main Topics  . Smoking status: Never Smoker  . Smokeless tobacco: Never Used  . Alcohol use No  . Drug use: No  . Sexual activity: Not on file   Other Topics Concern  . Not on file   Social History Narrative  . No narrative on file    Family History:   The patient's family history includes Diabetes Mellitus II in her sister; Stroke in her brother.  ROS:  Please see the history of present illness.  ROS  All other ROS reviewed and negative.     Physical Exam/Data:   Vitals:   02/17/17 1933 02/17/17 1945 02/18/17 0500 02/18/17 0928  BP:  108/68 99/62 105/64  Pulse:  80 67   Resp:  (!) 22 20   Temp:  98.4 F (36.9 C) 98.1 F (36.7 C)   TempSrc:  Oral Oral   SpO2: 100% 98% 100%   Weight:   154 lb 9.6 oz (70.1 kg)   Height:        Intake/Output Summary (Last 24 hours) at 02/18/17 1049 Last data filed at 02/18/17 0654  Gross per 24 hour  Intake           482.33 ml  Output             1600 ml  Net         -1117.67 ml   Filed Weights   02/16/17 0500 02/17/17 0500 02/18/17 0500  Weight: 154 lb 12.8 oz (70.2 kg) 155 lb 11.2 oz (70.6 kg) 154 lb 9.6 oz (70.1 kg)   Body mass index is 28.28 kg/m.  General:  Well nourished, well developed, in no acute distress HEENT: normal Lymph: no adenopathy Neck: no JVD Endocrine:  No thryomegaly Vascular: No carotid bruits; FA pulses 2+ bilaterally without bruits  Cardiac:  normal S1, S2; RRR; no murmur Lungs:  CTA b/l, no wheezing, rhonchi or rales  Abd: soft, nontender Ext: no edema Musculoskeletal:  No deformities, age appropriate atrophy Skin: warm and dry  Neuro:  no gross focal abnormalities appreciated Psych:  Normal affect   EKG:  The EKG was personally reviewed  and demonstrates:  SR All EKGs in Epic have been reviewed all are SR Telemetry:  Telemetry was personally reviewed and demonstrates:  SR only  Relevant CV Studies:  TTE Left ventricle: The cavity size was at the upper limits of normal. There was mild concentric hypertrophy. Systolic function was normal. The estimated ejection fraction was in the range of 55% to 60%. Wall motion was normal; there were no regional wall motion abnormalities. There was a reduced contribution of  atrial contraction to ventricular filling, due to increased ventricular diastolic pressure or atrial contractile dysfunction. Doppler parameters are consistent with a reversible restrictive pattern, indicative of decreased left ventricular diastolic compliance and/or increased left atrial pressure (grade 3 diastolic dysfunction). Doppler parameters are consistent with high ventricular filling pressure. - Aortic valve: Trileaflet; mildly thickened, mildly calcified leaflets. - Mitral valve: Mild diffuse thickening of the anterior leaflet. Mild, late systolicprolapse, involving the anterior leaflet. There was moderate regurgitation. - Left atrium: The atrium was moderately dilated. - Right atrium: The atrium was mildly dilated. - Tricuspid valve: There was moderate regurgitation. - Pulmonic valve: There was mild regurgitation. - Pulmonary arteries: PA peak pressure: 61 mm Hg (S). Impressions: - The right ventricular systolic pressure was increased consistent with moderate pulmonary hypertension.   Laboratory Data:  Chemistry Recent Labs Lab 02/16/17 0252 02/17/17 0213 02/18/17 0255  NA 138 140 140  K 3.8 4.0 4.2  CL 103 106 107  CO2 26 27 26   GLUCOSE 104* 98 89  BUN 22* 26* 22*  CREATININE 1.42* 1.55* 1.53*  CALCIUM 9.3 9.2 9.4  GFRNONAA 36* 33* 33*  GFRAA 42* 38* 38*  ANIONGAP 9 7 7      Recent Labs Lab 02/12/17 0534  PROT 6.0*  ALBUMIN 3.2*  AST 16  ALT 14    ALKPHOS 59  BILITOT 1.5*   Hematology Recent Labs Lab 02/14/17 1841  WBC 3.9*  RBC 4.25  HGB 12.6  HCT 38.6  MCV 90.8  MCH 29.6  MCHC 32.6  RDW 13.8  PLT 258   Cardiac Enzymes Recent Labs Lab 02/15/17 0645 02/15/17 1140  TROPONINI 0.07* 0.06*    Recent Labs Lab 02/14/17 1856 02/15/17 0007  TROPIPOC 0.08 0.10*    BNP Recent Labs Lab 02/14/17 1841  BNP 538.0*     Radiology/Studies:  Dg Chest 2 View Result Date: 02/14/2017 CLINICAL DATA:  Pain with lying down x1 week. History of stroke last week. EXAM: CHEST  2 VIEW COMPARISON:  02/10/2017 and 08/20/2016 FINDINGS: There is cardiomegaly that appears stable from recent 02/11/2017 comparison but increased in size CT from 08/19/2016. The presence of a pericardial effusion is not entirely excluded. No aortic aneurysm. Trace tiny effusions blunting the costophrenic angles bilaterally. No overt pulmonary edema, pneumothorax or pulmonary consolidations. Minimal bibasilar atelectasis. No acute nor suspicious osseous abnormalities. Surgical clips are seen in the right upper quadrant. IMPRESSION: 1. Cardiomegaly, increased from 08/20/2016 radiographs. Question pericardial effusion or a cardiomyopathy accounting for this appearance. 2. Aortic atherosclerosis without aneurysm. 3. Trace bilateral pleural effusions. Electronically Signed   By: Ashley Royalty M.D.   On: 02/14/2017 19:24   Ct Head Wo Contrast Result Date: 02/14/2017 CLINICAL DATA:  Initial evaluation for acute headache with leg weakness. History of recent stroke, status post tPA. EXAM: CT HEAD WITHOUT CONTRAST TECHNIQUE: Contiguous axial images were obtained from the base of the skull through the vertex without intravenous contrast. COMPARISON:  Prior MRI and CT from 02/10/2017. FINDINGS: Brain: Evolving 2 cm subacute ischemic infarct involving the superior left cerebellar hemisphere is seen, now more well-defined as compared to previous study. No significant mass effect. No  evidence for hemorrhagic transformation. Small focus of encephalomalacia at the posterior left sylvian filter also unchanged. No acute intracranial hemorrhage. No evidence for acute large vessel territory infarct. No mass lesion, midline shift or mass effect. No hydrocephalus. No extra-axial fluid collection. Vascular: Asymmetric hyperdensity at the basilar tip may be related to previous identified basilar tip thrombus, similar to previous.  Skull: Scalp soft tissues and calvarium within normal limits. Sinuses/Orbits: Globes and orbital soft tissues within normal limits. Paranasal sinuses and mastoids are clear. IMPRESSION: 1. Continued interval evolution of subacute 2 cm left superior cerebellar infarct. No significant mass effect or evidence for hemorrhagic transformation. 2. Asymmetric hyperdensity at the basilar tip, suspected be related to recently identified basilar tip thrombus, better evaluated on recent catheter directed arteriogram. 3. No other new acute intracranial process identified. Electronically Signed   By: Jeannine Boga M.D.   On: 02/14/2017 22:09   Ct Head Wo Contrast 02/10/2017 IMPRESSION: Mild chronic ischemic white matter disease. Low density seen in superior portion of the left cerebellar hemisphere concerning for infarction of indeterminate age. MRI may be performed for further evaluation.    Mr Angiogram Head Wo Contrast 02/10/2017 IMPRESSION: No anterior circulation abnormal finding. Absence of flow at the distal basilar most consistent with embolic occlusion. Patent posterior communicating artery on the right gives supply to the right posterior cerebral artery. Smaller posterior communicating artery on the left supplies the left posterior cerebral artery. I think there is probably some flow in the right superior cerebellar artery but no demonstrated flow in the left superior cerebellar artery.   Mr Brain Wo Contrast (neuro Protocol) 02/10/2017 IMPRESSION: 2 cm acute  infarction affecting the superior cerebellum on the left. Possible punctate acute infarction in the right cerebellum.   DSA - 1.Large filling defect distal basilar artery with near complete obstruction of both superior cerebellar arteries and posterior cerebral arteries . 2.Bilaterally patent PCOMs      Assessment and Plan:   1. Recent [employer] strok(last week)     TTE done at her last stay w/o thrombus     Cerebral angio noted b/l carotid systems widely patent     TEE planned for today The patient was seen today by Dr. Curt Bears, ILR procedure, indication, alternative monitoring such as event monitor was as well discussed by myself, risks/benefits of the ILR implant were discussed. The patient would like to proceed with ILR if TEE is unrevealing.  I have discussed site care and follow up with the patient.    2. CP     Not an ongoing complaint     No hx of CAD     LHC 01/12/16 with widely patent coronaries  3. CHF exacerbation (diastolic)     Exam is euvolemic     She is cumulatively fluid negative -[id number]     IM is managing  ILR wound check follow up Lexander Tremblay be arranged.  Signed, Baldwin Jamaica, PA-C  02/18/2017 10:49 AM  I have seen and examined this patient with Tommye Standard.  Agree with above, note added to reflect my findings.  On exam, RRR, no murmurs, lungs clear. Patient found to have CVA on presentation to the hospital for noncardiac chest pain. Plan for TEE today and if no source of embolism, Brailey Buescher plan for [facility] monitor. Risks and benefits discussed. Risks include but not limited to bleeding and infection. The patient understands the risks and has agreed to the procedure.    Coutney Wildermuth M. Ervin Rothbauer MD 02/18/2017 11:58 AM

## 2017-02-18 NOTE — CV Procedure (Signed)
    Transesophageal Echocardiogram Note  CANNON QUINTON [id number] 04/14/1946  Procedure: Transesophageal Echocardiogram Indications: CVA  Procedure Details Consent: Obtained Time Out: Verified patient identification, verified procedure, site/side was marked, verified correct patient position, special equipment/implants available, Radiology Safety Procedures followed,  medications/allergies/relevent history reviewed, required imaging and test results available.  Performed  Medications:  During this procedure the patient is administered a total of Versed 4 mg and Fentanyl 25 mcg  to achieve and maintain moderate conscious sedation.  The patient's heart rate, blood pressure, and oxygen saturation are monitored continuously during the procedure. The period of conscious sedation is 30 minutes, of which I was present face-to-face 100% of this time.  Left Ventrical:  Low normal EF   Mitral Valve: moderate - severe MR   Aortic Valve: normal   Tricuspid Valve: mild - mod TR  Pulmonic Valve: trace PI   Left Atrium/ Left atrial appendage: no thrombi   Atrial septum: no PRO or ASD by color flow   Aorta: mild calcification   Complications: No apparent complications Patient did tolerate procedure well.   Thayer Headings, Brooke Bonito., MD, [facility] 02/18/2017, 1:11 PM

## 2017-02-20 ENCOUNTER — Encounter (HOSPITAL_COMMUNITY): Payer: Self-pay | Admitting: Cardiovascular Disease

## 2017-02-21 ENCOUNTER — Encounter (HOSPITAL_COMMUNITY): Payer: Self-pay | Admitting: Cardiology

## 2017-02-21 ENCOUNTER — Ambulatory Visit: Payer: Self-pay

## 2017-02-22 ENCOUNTER — Other Ambulatory Visit: Payer: Self-pay

## 2017-02-22 NOTE — Patient Outreach (Signed)
[facility]) Care Management  02/22/2017  Katie [name] 04-09-1946 [id number]   EMMI: Stroke Referral date: 02/18/17 Referral source: EMM stroke red alert Referral reason: Feeling worse overall; YES, New worsening symptoms: YES Day # 3  Telephone call to patient regarding EMMI stroke RED alert referral. HIPAA verified with patient. Discussed EMMI stroke follow up program. Patient confirms she was discharged from the hospital on 02/18/17.  Patient states she was admitted for a stroke initially and was discharged home.  Patient states 2 days later she was readmitted to the hospital due to her congestive heart failure.  Patient states she lives alone. Patient states she has a daughter and son that check on her. Patient denies being discharged with home health. Patient reports having and taking her medications as prescribed. Patient states she woke up on yesterday and her left eye was red.. Patient denies and /or denied having pain or visual disturbance with her eye. Patient reports her eye has cleared up at this time. Patient report she has an appointment scheduled with her primary MD on 02/25/17, follow up with the cardiologist on 02/24/17 and 02/28/17 and a follow up with Dr. Leonie Man, neurologist on 04/25/17.   Patient reports she has been in the hospital previously for heart failure in June 2017, December 2017, and January 2018.  RNCM discussed [facility] care management services with patient. Patient verbally agreed to community case management follow up.   RNCM discussed signs/ symptoms of stroke with patient. Advised patient to call 911 for stroke like symptoms.  RNCM advised patient to notify MD of any changes in condition prior to scheduled appointment. RNCM provided contact name and number: [phone] or main office number [phone] and 24 hour nurse advise line [phone].  RNCM verified patient aware of 911 services for urgent/ emergent needs.  ASSESSMENT; Per chart:  Problem list:   Stroke ([place]) 1. Chronic diastolic congestive heart failure 2. Chronic kidney disease stage III 3. Essential hypertension 4. Hyperlipidemia  PLAN: RNCM will refer patient to community case management for education/ management heart failure  Quinn Plowman RN,BSN,CCM [facility] Telephonic  [phone]

## 2017-02-23 ENCOUNTER — Encounter: Payer: Self-pay | Admitting: Physician Assistant

## 2017-02-23 NOTE — Progress Notes (Signed)
Cardiology Office Note    Date:  02/24/2017  ID:  Katie [name], DOB Jan 05, 1946, MRN [id number] PCP:  Seward Carol, MD  Cardiologist:  Dr. Acie Fredrickson   Chief Complaint: f/u CHF  History of Present Illness:  Katie [name] is a 71 y.o. female with history of chronic combined CHF, mitral regurgitation, CKD stage III, single kidney (R nephrectomy 2/2 ca), HTN, HLD, pulm HTN, recent stroke who presents for post-hospital follow-up.   She has history of prior admission for SOB in 2017 with fluid overload. D-dimer was elevated. VQ scan showed low probability of PE. CXR showed increased interstitial opacity and new effusions  Echo showed normal LVEF of 60-65%, grade 3 DD,  moderate MR, with a reversible restrictive pattern indicative of decreased LV diastolic compliance. 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 Cath that admission showed normal cors, EF 35-40%, and elevated pulmonary artery pressures of 23 mmHg, no significant mitral regurgitation despite echo findings.   She was admitted 7/12-7/14/18 with headache and found to have acute stroke (left SCA small infarction due to absent flow at distal BA and both SCAs and both PCAs, embolic pattern, etiology not clear). She was placed on aspirin 325mg  daily (previously 81mg ) and Plavix 75mg  daily with plan for DAPT for 3 months then Plavix alone. 2D echo was performed showing mild LVH, EF 55-60%, grade 3 DD, mild MVP with moderate TR, mod LAE, mild RAE, moderate TR, mild PR, PASP 68mmHg. Outpatient TEE was recommended. She returned to the hospital 7/17-7/20/18 with chest pain suspected due to acute on chronic diastolic CHF versus GI. TEE 02/18/17 showed EF 45-50%, moderate-severe MR, mild-moderate TR, and she had implantable loop recorder placed 02/18/17. Last labs 02/18/17 showed Cr 1.53, K 4.2, troponin peak 0.07 (low and flat in 2017 as well), BNP 538, Hgb 12.6.  She returns  for follow-up overall feeling better since recent hospital stay. She has noticed she has a tendency to be more tired in the morning but energy improves and feels normal by the afternoon/evening. Does note some residual DOE but states she "works through it." Does not report chest pain. ILR wound site without complication - she had left the dressing in place. She does notice her eyes have been intermittently bloodshot - sometimes will clear up completely. No eye pain today, reports vision is normal today. She had this several years ago as well. She has an appointment with PCP in AM.  Past Medical History:  Diagnosis Date  . Chronic combined systolic and diastolic CHF (congestive heart failure) ([place])   . 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 History of kidney cancer    a. s/p right nephrectomy ~2007.  Marland Kitchen History of loop recorder   . Hypercholesterolemia   . Hypertension   . Mitral regurgitation   . Pulmonary hypertension ([place])   . Stroke [facility])     Past Surgical History:  Procedure Laterality Date  . CARDIAC CATHETERIZATION N/A 01/12/2016   Procedure: Right/Left Heart Cath and Coronary/Graft Angiography;  Surgeon: Belva Crome, MD;  Location: [facility];  Service: Cardiovascular;  Laterality: N/A;  . CERVICAL POLYPECTOMY N/A 04/23/2013   Procedure: CERVICAL POLYPECTOMY;  Surgeon: Maeola Sarah. Landry Mellow, MD;  Location: [facility];  Service: Gynecology;  Laterality: N/A;  Possible Polypectomy  . HYSTEROSCOPY W/D&C N/A 04/23/2013   Procedure: DILATATION  AND CURETTAGE /HYSTEROSCOPY;  Surgeon: Maeola Sarah. Landry Mellow, MD;  Location: [facility];  Service: Gynecology;  Laterality: N/A;  . IR ANGIO INTRA EXTRACRAN SEL COM CAROTID INNOMINATE BILAT MOD SED  02/11/2017  . IR ANGIO VERTEBRAL SEL VERTEBRAL BILAT MOD SED  02/11/2017  . KNEE ARTHROSCOPY Left   . LOOP RECORDER INSERTION N/A 02/18/2017   Procedure: Loop Recorder Insertion;  Surgeon: Constance Haw, MD;  Location:  [facility];  Service: Cardiovascular;  Laterality: N/A;  . NEPHRECTOMY Right    due to carcinoma  . TEE WITHOUT CARDIOVERSION N/A 02/18/2017   Procedure: TRANSESOPHAGEAL ECHOCARDIOGRAM (TEE) WITH LOOP;  Surgeon: Thayer Headings, MD;  Location: [facility];  Service: Cardiovascular;  Laterality: N/A;    Current Medications: Current Meds  Medication Sig  . acetaminophen (TYLENOL) 500 MG tablet Take 1,000 mg by mouth every 6 (six) hours as needed for headache (pain).  Marland Kitchen aspirin 325 MG tablet Take 1 tablet (325 mg total) by mouth daily.  . butalbital-acetaminophen-caffeine (FIORICET, ESGIC) 50-325-40 MG tablet Take 1 tablet by mouth every 12 (twelve) hours as needed for headache.  . clopidogrel (PLAVIX) 75 MG tablet Take 1 tablet (75 mg total) by mouth daily.  . divalproex (DEPAKOTE) 500 MG DR tablet Take 1 tablet (500 mg total) by mouth at bedtime.  . feeding supplement, ENSURE ENLIVE, (ENSURE ENLIVE) LIQD Take 237 mLs by mouth 2 (two) times daily between meals.  . furosemide (LASIX) 20 MG tablet Take 20 mg by mouth daily.   Marland Kitchen latanoprost (XALATAN) 0.005 % ophthalmic solution Place 1 drop into both eyes at bedtime.  . metoprolol tartrate (LOPRESSOR) 25 MG tablet Take 0.5 tablets (12.5 mg total) by mouth 2 (two) times daily.  . pantoprazole (PROTONIX) 40 MG tablet Take 1 tablet (40 mg total) by mouth daily.  . potassium chloride (MICRO-K) 10 MEQ CR capsule Take 2 capsules (20 mEq total) by mouth daily.  . pravastatin (PRAVACHOL) 80 MG tablet Take 80 mg by mouth at bedtime.      Allergies:   Tramadol   Social History   Social History  . Marital status: Divorced    Spouse name: N/A  . Number of children: N/A  . Years of education: N/A   Social History Main Topics  . Smoking status: Never Smoker  . Smokeless tobacco: Never Used  . Alcohol use No  . Drug use: No  . Sexual activity: Not Asked   Other Topics Concern  . None   Social History Narrative  . None     Family  History:  Family History  Problem Relation Age of Onset  . Stroke Brother   . Diabetes Mellitus II Sister     ROS:   Please see the history of present illness.  All other systems are reviewed and otherwise negative.    PHYSICAL EXAM:   VS:  BP 124/62   Pulse 77   Ht 5\' 2"  (1.575 m)   Wt 157 lb 1.9 oz (71.3 kg)   LMP 02/12/2013   SpO2 98%   BMI 28.74 kg/m   BMI: Body mass index is 28.74 kg/m. GEN: Well developed, somewhat chronically ill appearing AAF, in no acute distress  HEENT: normocephalic, atraumatic, bilateral sclera mildly injected and icteric, no exudate Neck: no JVD, carotid bruits, or masses Cardiac: RRR; no murmurs, rubs, or gallops, no edema  Respiratory:  clear to auscultation bilaterally, normal work of breathing GI: soft, nontender, nondistended, + BS MS: no deformity or atrophy  Skin: warm and dry, no rash - removed old wound dressing from ILR site, no erythema, dehiscence or suppuration - replaced with fresh bandaid Neuro:  Alert and Oriented x 3, Strength and sensation are intact, follows commands Psych: euthymic mood, full affect  Wt Readings from Last 3 Encounters:  02/24/17 157 lb 1.9 oz (71.3 kg)  02/18/17 154 lb 9.6 oz (70.1 kg)  02/10/17 163 lb 4.8 oz (74.1 kg)      Studies/Labs Reviewed:   EKG:   EKG was not ordered today  Recent Labs: 02/12/2017: ALT 14 02/14/2017: B Natriuretic Peptide 538.0; Hemoglobin 12.6; Platelets 258 02/18/2017: BUN 22; Creatinine, Ser 1.53; Potassium 4.2; Sodium 140   Lipid Panel    Component Value Date/Time   CHOL 159 02/10/2017 0900   TRIG 66 02/10/2017 0900   HDL 58 02/10/2017 0900   CHOLHDL 2.7 02/10/2017 0900   VLDL 13 02/10/2017 0900   LDLCALC 88 02/10/2017 0900    Additional studies/ records that were reviewed today include: Summarized above.    ASSESSMENT & PLAN:   1. Chronic combined CHF - appears euvolemic on exam. She does bring in a log of BP, the majority of which appear to be in the  100-120 range, but 2 outliers in the upper 18A-41Y systolic within the last week. In the last IM note, BP was recorded as 84/64-108/68 so she tends to run chronically low. She's not had any dizziness or syncope. She is on very little medication. Cannot titrate regimen further due to her hypotension. BP is normal today. Will check CBC to r/o any new anemia contributing. Have asked her to continue to follow BP and notify us if BP continues to run <100, at which time I would consider dropping low dose beta blocker altogether. Discussed 2g sodium, 2L fluid restriction with patient. She has also been following daily weights at home and reports it's been running 155 consistently. 2. Mitral regurgitation - d/w Dr.McAlhany, DOD, in Dr. [name] absence. Will plan on giving her some time to recover from CVA and return to clinic in 4-6 weeks to discuss whether referral to TCTS is appropriate given decline in EF on TEE. 3. CKD stage III - recheck BMET today. Pt believes she does follow already with neurology but isn't completely sure. 4. History of stroke - on DAPT with above dosing per neuro. Reports f/u with neuro in September. Keep f/u for wound check next week to ensure integrity of procedure site and receive education regarding monitoring. 5. Scleral injection, also with what appears to be posisble mild icterus - she reports this has been waxing and waning, sometimes clears up completely. She also reports this has happened several years ago as well. I do not see this previously noted on hospital notes and this is my first time meeting the patient. I will check CMET today to r/o any acute liver abnormalities. I advised her to contact her PCP ASAP regarding this today given recent stroke to determine if they want to see her sooner. She verbalized understanding.  Disposition: F/u with Dr. Acie Fredrickson or care team APP on a day when Dr. Acie Fredrickson is here, in 4-6 weeks, sooner if needed.   Medication Adjustments/Labs and Tests  Ordered: Current medicines are reviewed at length with the patient today.  Concerns regarding medicines are outlined above. Medication changes, Labs and Tests ordered today are summarized above and listed in the Patient Instructions accessible in Encounters.   Signed, Charlie Pitter, PA-C  02/24/2017 7:50 AM  [facility] [place], [place], [place]  [address] Phone: [phone]; Fax: [phone]

## 2017-02-24 ENCOUNTER — Ambulatory Visit (INDEPENDENT_AMBULATORY_CARE_PROVIDER_SITE_OTHER): Payer: PPO | Admitting: Physician Assistant

## 2017-02-24 ENCOUNTER — Other Ambulatory Visit: Payer: Self-pay

## 2017-02-24 ENCOUNTER — Encounter (INDEPENDENT_AMBULATORY_CARE_PROVIDER_SITE_OTHER): Payer: Self-pay

## 2017-02-24 ENCOUNTER — Encounter: Payer: Self-pay | Admitting: Physician Assistant

## 2017-02-24 VITALS — BP 124/62 | HR 77 | Ht 62.0 in | Wt 157.1 lb

## 2017-02-24 DIAGNOSIS — I959 Hypotension, unspecified: Secondary | ICD-10-CM

## 2017-02-24 DIAGNOSIS — Z8673 Personal history of transient ischemic attack (TIA), and cerebral infarction without residual deficits: Secondary | ICD-10-CM

## 2017-02-24 DIAGNOSIS — N183 Chronic kidney disease, stage 3 unspecified: Secondary | ICD-10-CM

## 2017-02-24 DIAGNOSIS — I340 Nonrheumatic mitral (valve) insufficiency: Secondary | ICD-10-CM | POA: Diagnosis not present

## 2017-02-24 DIAGNOSIS — I5042 Chronic combined systolic (congestive) and diastolic (congestive) heart failure: Secondary | ICD-10-CM

## 2017-02-24 LAB — CBC WITH DIFFERENTIAL/PLATELET
Basophils Absolute: 0 10*3/uL (ref 0.0–0.2)
Basos: 1 %
EOS (ABSOLUTE): 0 10*3/uL (ref 0.0–0.4)
Eos: 0 %
Hematocrit: 39.1 % (ref 34.0–46.6)
Hemoglobin: 13.2 g/dL (ref 11.1–15.9)
Immature Grans (Abs): 0 10*3/uL (ref 0.0–0.1)
Immature Granulocytes: 0 %
Lymphocytes Absolute: 1.5 10*3/uL (ref 0.7–3.1)
Lymphs: 50 %
MCH: 30.8 pg (ref 26.6–33.0)
MCHC: 33.8 g/dL (ref 31.5–35.7)
MCV: 91 fL (ref 79–97)
Monocytes Absolute: 0.2 10*3/uL (ref 0.1–0.9)
Monocytes: 7 %
Neutrophils Absolute: 1.2 10*3/uL — ABNORMAL LOW (ref 1.4–7.0)
Neutrophils: 42 %
Platelets: 302 10*3/uL (ref 150–379)
RBC: 4.28 x10E6/uL (ref 3.77–5.28)
RDW: 14.3 % (ref 12.3–15.4)
WBC: 2.9 10*3/uL — ABNORMAL LOW (ref 3.4–10.8)

## 2017-02-24 LAB — COMPREHENSIVE METABOLIC PANEL
ALT: 15 IU/L (ref 0–32)
AST: 17 IU/L (ref 0–40)
Albumin/Globulin Ratio: 1.3 (ref 1.2–2.2)
Albumin: 4 g/dL (ref 3.5–4.8)
Alkaline Phosphatase: 112 IU/L (ref 39–117)
BUN/Creatinine Ratio: 15 (ref 12–28)
BUN: 22 mg/dL (ref 8–27)
Bilirubin Total: 0.5 mg/dL (ref 0.0–1.2)
CO2: 25 mmol/L (ref 20–29)
Calcium: 9.8 mg/dL (ref 8.7–10.3)
Chloride: 103 mmol/L (ref 96–106)
Creatinine, Ser: 1.45 mg/dL — ABNORMAL HIGH (ref 0.57–1.00)
GFR calc Af Amer: 42 mL/min/{1.73_m2} — ABNORMAL LOW (ref >59)
GFR calc non Af Amer: 36 mL/min/{1.73_m2} — ABNORMAL LOW (ref >59)
Globulin, Total: 3 g/dL (ref 1.5–4.5)
Glucose: 90 mg/dL (ref 65–99)
Potassium: 4.2 mmol/L (ref 3.5–5.2)
Sodium: 142 mmol/L (ref 134–144)
Total Protein: 7 g/dL (ref 6.0–8.5)

## 2017-02-24 NOTE — Patient Instructions (Addendum)
Medication Instructions: - Your physician recommends that you continue on your current medications as directed. Please refer to the Current Medication list given to you today.  Labwork: - Your physician recommends that you have lab work today: CBC and CMET  Procedures/Testing: - None Ordered  Follow-Up: - Your physician recommends that you schedule a follow-up appointment in: 4-6 WEEKS with Dr. Acie Fredrickson OR an APP on a day that Dr. Acie Fredrickson is in the office  - Please keep your 02/28/17 Wound Check appointment with the [facility]   If you need a refill on your cardiac medications before your next appointment, please call your pharmacy.

## 2017-02-25 ENCOUNTER — Other Ambulatory Visit: Payer: Self-pay

## 2017-02-25 DIAGNOSIS — I5032 Chronic diastolic (congestive) heart failure: Secondary | ICD-10-CM | POA: Diagnosis not present

## 2017-02-25 DIAGNOSIS — E7800 Pure hypercholesterolemia, unspecified: Secondary | ICD-10-CM | POA: Diagnosis not present

## 2017-02-25 DIAGNOSIS — Z85528 Personal history of other malignant neoplasm of kidney: Secondary | ICD-10-CM | POA: Diagnosis not present

## 2017-02-25 DIAGNOSIS — I639 Cerebral infarction, unspecified: Secondary | ICD-10-CM | POA: Diagnosis not present

## 2017-02-25 DIAGNOSIS — N183 Chronic kidney disease, stage 3 (moderate): Secondary | ICD-10-CM | POA: Diagnosis not present

## 2017-02-25 DIAGNOSIS — I509 Heart failure, unspecified: Secondary | ICD-10-CM | POA: Diagnosis not present

## 2017-02-25 NOTE — Patient Outreach (Signed)
   Unsuccessful attempt made to contact patient via telephone for community care coordination.  Plan: Make another attempt to contact patient in the next 21 days

## 2017-02-26 NOTE — Patient Outreach (Signed)
    Initial telephone contact to schedule home visit to assess for community care coordination.  Plan: Home visit in the next 28 to assess for community care coordination needs, and for chronic disease education

## 2017-02-28 ENCOUNTER — Ambulatory Visit (INDEPENDENT_AMBULATORY_CARE_PROVIDER_SITE_OTHER): Payer: Self-pay | Admitting: *Deleted

## 2017-02-28 DIAGNOSIS — Z8673 Personal history of transient ischemic attack (TIA), and cerebral infarction without residual deficits: Secondary | ICD-10-CM

## 2017-02-28 LAB — CUP PACEART INCLINIC DEVICE CHECK
Date Time Interrogation Session: 20180730095251
Implantable Pulse Generator Implant Date: 20180720

## 2017-02-28 NOTE — Progress Notes (Signed)
Wound Loop check in clinic. Band aid removed, incision well healed, incision edges approximated no redness or swelling. Battery status: Good . R-waves 0.25 mV.0 symptom episodes, 0 tachy episodes, Pause and brady programmed off at implant. 0 AF episodes. Monthly summary reports and ROV with WC PRN

## 2017-03-03 ENCOUNTER — Ambulatory Visit: Payer: Self-pay

## 2017-03-03 ENCOUNTER — Other Ambulatory Visit: Payer: Self-pay

## 2017-03-04 NOTE — Patient Outreach (Signed)
[facility]) Care Management   03/04/2017  Katie [name] 03-22-1946 [id number]  Katie [name] is an 71 y.o. female with history of hypertension, HF, Hyperlipidemia.   Subjective:  I had a stroke but it did not do me like it did other people. I have been taking care of myself  Objective:   ROS  Well dress, small framed, very polite elderly lady.  Physical Exam  ROS  Encounter Medications:   Outpatient Encounter Prescriptions as of 03/03/2017  Medication Sig  . acetaminophen (TYLENOL) 500 MG tablet Take 1,000 mg by mouth every 6 (six) hours as needed for headache (pain).  Marland Kitchen aspirin 325 MG tablet Take 1 tablet (325 mg total) by mouth daily.  . butalbital-acetaminophen-caffeine (FIORICET, ESGIC) 50-325-40 MG tablet Take 1 tablet by mouth every 12 (twelve) hours as needed for headache.  . clopidogrel (PLAVIX) 75 MG tablet Take 1 tablet (75 mg total) by mouth daily.  . divalproex (DEPAKOTE) 500 MG DR tablet Take 1 tablet (500 mg total) by mouth at bedtime.  . feeding supplement, ENSURE ENLIVE, (ENSURE ENLIVE) LIQD Take 237 mLs by mouth 2 (two) times daily between meals.  . furosemide (LASIX) 20 MG tablet Take 20 mg by mouth daily.   Marland Kitchen latanoprost (XALATAN) 0.005 % ophthalmic solution Place 1 drop into both eyes at bedtime.  . metoprolol tartrate (LOPRESSOR) 25 MG tablet Take 0.5 tablets (12.5 mg total) by mouth 2 (two) times daily.  . pantoprazole (PROTONIX) 40 MG tablet Take 1 tablet (40 mg total) by mouth daily.  . potassium chloride (MICRO-K) 10 MEQ CR capsule Take 2 capsules (20 mEq total) by mouth daily.  . pravastatin (PRAVACHOL) 80 MG tablet Take 80 mg by mouth at bedtime.    No facility-administered encounter medications on file as of 03/03/2017.     Functional Status:   In your present state of health, do you have any difficulty performing the following activities: 03/03/2017 02/16/2017  Hearing? N -  Vision? Y -  Difficulty concentrating or making decisions? N -   Walking or climbing stairs? N -  Dressing or bathing? N -  Doing errands, shopping? N N  Preparing Food and eating ? N -  Using the Toilet? N -  In the past six months, have you accidently leaked urine? N -  Do you have problems with loss of bowel control? N -  Managing your Medications? N -  Managing your Finances? N -  Housekeeping or managing your Housekeeping? N -  Some recent data might be hidden    Fall/Depression Screening:    Fall Risk  03/03/2017  Falls in the past year? Yes  Number falls in past yr: 1  Injury with Fall? No  Risk for fall due to : History of fall(s);Impaired balance/gait;Impaired mobility;Impaired vision;Medication side effect  Follow up Education provided;Falls prevention discussed;Follow up appointment   PHQ 2/9 Scores 03/03/2017 02/22/2017  PHQ - 2 Score 0 0   THN CM Care Plan Problem One     Most Recent Value  Care Plan Problem One  I just had a stroke  Role Documenting the Problem One  Care Management [place] for Problem One  Active  THN Long Term Goal   Patient will have no acute care admissions for stroke  [facility] Long Term Goal Start Date  03/03/17  Interventions for Problem One Long Term Goal  Iniital home visit to assess for community care coordination needs  [facility] CM Short Term Goal #  2   patient will be in attendance to 100% of her medical appointments  [facility] CM Short Term Goal #2 Start Date  03/03/17  Interventions for Short Term Goal #2  8/2 home viist to assess patient needs    [facility] CM Care Plan Problem Two     Most Recent Value  Care Plan Problem Two  Patient will meet with this RNCM for Stroke Education     Assessment:   Patient recently discharged from the acute care setting with a CVA diagnosis.  Plan:

## 2017-03-08 ENCOUNTER — Ambulatory Visit: Payer: PPO | Attending: Internal Medicine

## 2017-03-08 DIAGNOSIS — R2689 Other abnormalities of gait and mobility: Secondary | ICD-10-CM

## 2017-03-08 DIAGNOSIS — M6281 Muscle weakness (generalized): Secondary | ICD-10-CM | POA: Insufficient documentation

## 2017-03-08 DIAGNOSIS — R42 Dizziness and giddiness: Secondary | ICD-10-CM | POA: Diagnosis not present

## 2017-03-08 NOTE — Therapy (Addendum)
[place] [address] [place], [place], [address] Phone: [phone]   Fax:  [phone]  Physical Therapy Evaluation  Patient Details  Name: Katie [name] MRN: [id number] Date of Birth: 1946/07/15 Referring Provider: Dr. Evangeline Gula (in hospital), will see Dr. Erlinda Hong next month.  Encounter Date: 03/08/2017      PT End of Session - 03/08/17 1138    Visit Number 1   Number of Visits 9  pt can only come 1x/week 2/2 finances   Date for PT Re-Evaluation 04/07/17   Authorization Type Health team Medicare: g-code and PN every 10th visit.    PT Start Time 1100   PT Stop Time 1134   PT Time Calculation (min) 34 min   Equipment Utilized During Treatment --  min guard prn   Activity Tolerance Patient tolerated treatment well   Behavior During Therapy WFL for tasks assessed/performed      Past Medical History:  Diagnosis Date  . Chronic combined systolic and diastolic CHF (congestive heart failure) ([place])   . 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 History of kidney cancer    a. s/p right nephrectomy ~2007.  Marland Kitchen History of loop recorder   . Hypercholesterolemia   . Hypertension   . Mitral regurgitation   . Pulmonary hypertension ([place])   . Stroke [facility])     Past Surgical History:  Procedure Laterality Date  . CARDIAC CATHETERIZATION N/A 01/12/2016   Procedure: Right/Left Heart Cath and Coronary/Graft Angiography;  Surgeon: Belva Crome, MD;  Location: [facility];  Service: Cardiovascular;  Laterality: N/A;  . CERVICAL POLYPECTOMY N/A 04/23/2013   Procedure: CERVICAL POLYPECTOMY;  Surgeon: Maeola Sarah. Landry Mellow, MD;  Location: [facility];  Service: Gynecology;  Laterality: N/A;  Possible Polypectomy  . HYSTEROSCOPY W/D&C N/A 04/23/2013   Procedure: DILATATION AND CURETTAGE /HYSTEROSCOPY;  Surgeon: Maeola Sarah. Landry Mellow, MD;  Location: [facility];  Service: Gynecology;  Laterality: N/A;  . IR ANGIO  INTRA EXTRACRAN SEL COM CAROTID INNOMINATE BILAT MOD SED  02/11/2017  . IR ANGIO VERTEBRAL SEL VERTEBRAL BILAT MOD SED  02/11/2017  . KNEE ARTHROSCOPY Left   . LOOP RECORDER INSERTION N/A 02/18/2017   Procedure: Loop Recorder Insertion;  Surgeon: Constance Haw, MD;  Location: [facility];  Service: Cardiovascular;  Laterality: N/A;  . NEPHRECTOMY Right    due to carcinoma  . TEE WITHOUT CARDIOVERSION N/A 02/18/2017   Procedure: TRANSESOPHAGEAL ECHOCARDIOGRAM (TEE) WITH LOOP;  Surgeon: Acie Fredrickson Wonda Cheng, MD;  Location: [facility];  Service: Cardiovascular;  Laterality: N/A;    There were no vitals filed for this visit.       Subjective Assessment - 03/08/17 1107    Subjective Pt reports she was admitted on 02/10/17 for L SCA CVA, as she was staggering. Pt states she stills feels wobbly, and her eyes don't feel right at times. Pt feels worse in the morning vs. at night. Pt denied dizziness. Pt reports she continues to experience HAs and MD is aware and told pt it is likely migraines. Pt denied falls.    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   Patient Stated Goals "Get back to how I used to be."  Currently in Pain? No/denies            [facility] PT Assessment - 03/08/17 1110      Assessment   Medical Diagnosis CVA   Referring Provider Dr. Evangeline Gula (in hospital), will see Dr. Erlinda Hong next month.   Onset Date/Surgical Date 02/10/17   Hand Dominance Left   Next MD Visit Next month   Prior Therapy Acute PT     Precautions   Precautions Fall     Restrictions   Weight Bearing Restrictions No     Balance Screen   Has the patient fallen in the past 6 months No   Has the patient had a decrease in activity level because of a fear of falling?  No   Is the patient  reluctant to leave their home because of a fear of falling?  No     Home Environment   Living Environment Private residence   Living Arrangements Alone   Available Help at Discharge Family;Available PRN/intermittently   Type of Home Apartment   Home Access Level entry   Home Layout One level   [place] - single point     Prior Function   Level of Independence Independent   Vocation Retired   Leisure Go to [employer]     Cognition   Overall Cognitive Status Within Functional Limits for tasks assessed     Observation/Other Assessments   Focus on Therapeutic Outcomes (FOTO)  Neuro QOL LE: 41.3     Sensation   Light Touch Appears Intact   Additional Comments Pt reported intermittent N/T in B hands, prior to stroke.     Coordination   Gross Motor Movements are Fluid and Coordinated Yes  RAMs WNL   Fine Motor Movements are Fluid and Coordinated Yes   Finger Nose Finger Test WNL   Heel Shin Test WNL     Posture/Postural Control   Posture/Postural Control Postural limitations   Postural Limitations Forward head     Tone   Assessment Location Right Lower Extremity;Left Lower Extremity     ROM / Strength   AROM / PROM / Strength AROM;Strength     AROM   Overall AROM  Within functional limits for tasks performed   Overall AROM Comments BUE/LE AROM WFL     Strength   Overall Strength Deficits   Overall Strength Comments B LE: 5/5, except for 4/5 B hamstrings, B hip abd., and ankle DF. B hip ext not formally tested.     Transfers   Transfers Sit to Stand;Stand to Sit   Sit to Stand 7: Independent;Without upper extremity assist;From chair/3-in-1   Stand to Sit 7: Independent;Without upper extremity assist;To chair/3-in-1     Ambulation/Gait   Ambulation/Gait Yes   Ambulation/Gait Assistance 5: Supervision   Ambulation/Gait Assistance Details Pt experienced incr. postural sway in lobby and during turns.   Ambulation Distance (Feet) 200 Feet   Assistive  device None   Gait Pattern Step-through pattern;Decreased stride length;Decreased arm swing - left;Decreased arm swing - right   Ambulation Surface Indoor;Level   Gait velocity 3.36ft/sec. no AD     Functional Gait  Assessment   Gait assessed  Yes   Gait Level Surface Walks 20 ft in less than 7 sec but greater than 5.5 sec, uses assistive device, slower speed, mild gait deviations, or deviates 6-10 in outside of the 12 in walkway width.  6.0 sec.   Change in Gait Speed Able to change speed, demonstrates mild gait deviations,  deviates 6-10 in outside of the 12 in walkway width, or no gait deviations, unable to achieve a major change in velocity, or uses a change in velocity, or uses an assistive device.   Gait with Horizontal Head Turns Performs head turns smoothly with slight change in gait velocity (eg, minor disruption to smooth gait path), deviates 6-10 in outside 12 in walkway width, or uses an assistive device.   Gait with Vertical Head Turns Performs task with slight change in gait velocity (eg, minor disruption to smooth gait path), deviates 6 - 10 in outside 12 in walkway width or uses assistive device   Gait and Pivot Turn Pivot turns safely in greater than 3 sec and stops with no loss of balance, or pivot turns safely within 3 sec and stops with mild imbalance, requires small steps to catch balance.   Step Over Obstacle Is able to step over one shoe box (4.5 in total height) without changing gait speed. No evidence of imbalance.   Gait with Narrow Base of Support Is able to ambulate for 10 steps heel to toe with no staggering.   Gait with Eyes Closed Walks 20 ft, slow speed, abnormal gait pattern, evidence for imbalance, deviates 10-15 in outside 12 in walkway width. Requires more than 9 sec to ambulate 20 ft.   Ambulating Backwards Walks 20 ft, slow speed, abnormal gait pattern, evidence for imbalance, deviates 10-15 in outside 12 in walkway width.   Steps Alternating feet, must use rail.    Total Score 19   FGA comment: 19/30: indicates pt is at moderate risk for falls.      RLE Tone   RLE Tone Within Functional Limits     LLE Tone   LLE Tone Within Functional Limits            Objective measurements completed on examination: See above findings.                  PT Education - 03/08/17 1137    Education provided Yes   Education Details PT discussed exam findings, PT POC, frequency, and duration.    Person(s) Educated Patient;Child(ren)   Methods Explanation   Comprehension Verbalized understanding          PT Short Term Goals - 03/08/17 1141      PT SHORT TERM GOAL #1   Title same as LTGs           PT Long Term Goals - 03/08/17 1141      PT LONG TERM GOAL #1   Title Pt will be IND in HEP to improve balance, strength, and dizziness. TARGET DATE FOR ALL LTGS: 04/05/17   Status New     PT LONG TERM GOAL #2   Title Pt will amb. 1000' over even/uneven terrain while performing head turns, IND, to improve functional mobility.    Status New     PT LONG TERM GOAL #3   Title Pt will improve FGA score to >/=27/30 to decr. falls risk.    Status New     PT LONG TERM GOAL #4   Title Pt will report 0/10 dizziness during activities with eyes closed and during head turns to improve safety during mobility.    Status New                Plan - 03/08/17 1138    Clinical Impression Statement Pt is a pleasant 71y/o female presenting to OPPT neuro s/p L SCA CVA. 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 Eval is low complexity. The following deficits were found upon exam: gait deviations, dizziness, impaired balance, and impaired strength. Pt likely has decr. vestibular input, as she experienced dizziness during activities with eyes closed. PT will eval further  next time. Pt's gait speed was WNL. However, pt's FGA score indicates pt is at risk for falls. Pt would benefit from skilled PT to improve safety during functional mobility.    History and Personal Factors relevant to plan of care: pt lives alone   Clinical Presentation Stable   Clinical Presentation due to: HTN, pulmonary HTN, HLD, CHF, hx of R renal CA s/p kidney removed in 2007, loop recorder, mitral valve regurgitation, recent imaging found occluded: distal basilar artery, B ACA and B PCA.   Clinical Decision Making Low   Rehab Potential Excellent   Clinical Impairments Affecting Rehab Potential see above   PT Frequency 2x / week  pt can only come 1x/week 2/2 finances   PT Duration 4 weeks   PT Treatment/Interventions ADLs/Self Care Home Management;Biofeedback;Canalith Repostioning;Neuromuscular re-education;Balance training;Therapeutic exercise;Therapeutic activities;Manual techniques;Vestibular;Functional mobility training;Gait training;Stair training;Orthotic Fit/Training;Patient/family education   PT Next Visit Plan Initiate balance and strengthening HEP. Vestibular assessment prn.    Consulted and Agree with Plan of Care Patient;Family member/caregiver   Family Member Consulted son      Patient will benefit from skilled therapeutic intervention in order to improve the following deficits and impairments:  Abnormal gait, Decreased endurance, Decreased balance, Decreased mobility, Dizziness, Postural dysfunction, Decreased strength  Visit Diagnosis: Other abnormalities of gait and mobility - Plan: PT plan of care cert/re-cert  Dizziness and giddiness - Plan: PT plan of care cert/re-cert  Muscle weakness (generalized) - Plan: PT plan of care cert/re-cert      G-Codes - 41/63/84 1144    Functional Assessment Tool Used (Outpatient Only) FGA: 19/30   Functional Limitation Mobility: Walking and moving around   Mobility: Walking and Moving Around Current Status [phone]) At least 40  percent but less than 60 percent impaired, limited or restricted   Mobility: Walking and Moving Around Goal Status [phone]) At least 1 percent but less than 20 percent impaired, limited or restricted       Problem List Patient Active Problem List   Diagnosis Date Noted  . Chronic migraine without aura without status migrainosus, not intractable   . Hypertension 02/15/2017  . Acute on chronic diastolic CHF (congestive heart failure) ([place]) 02/15/2017  . Basilar artery occlusion   . Cerebellar stroke ([place]) 02/10/2017  . Headache 02/10/2017  . Chest pain   . Moderate mitral regurgitation 01/08/2016  . Moderate tricuspid regurgitation 01/08/2016  . Moderate to severe pulmonary hypertension ([place]) 01/08/2016  . Acute CHF (congestive heart failure) ([place]) 01/07/2016  . Elevated troponin 01/07/2016  . Mild hypertension 01/07/2016  . CKD (chronic kidney disease), stage III 01/07/2016  . Hyperlipidemia 01/07/2016  . Acute CHF ([place]) 01/07/2016    Cope Marte L 03/08/2017, 11:47 AM  [facility] [address] [place], [place], [address] Phone: [phone]   Fax:  [phone]  Name: Katie [name] MRN: [id number] Date of Birth: 12-16-1945  Geoffry Paradise, PT,DPT 03/08/17 11:47 AM Phone: [phone] Fax: [phone]

## 2017-03-15 ENCOUNTER — Other Ambulatory Visit: Payer: Self-pay

## 2017-03-15 NOTE — Patient Outreach (Signed)
[facility]) Care Management  03/15/2017   Katie [name] Jan 12, 1946 [id number]  Subjective:  I have a little swelling today.  Objective:  1 plus edema of lower extremity noted, left foot and ankle.  Current Medications:  Current Outpatient Prescriptions  Medication Sig Dispense Refill  . acetaminophen (TYLENOL) 500 MG tablet Take 1,000 mg by mouth every 6 (six) hours as needed for headache (pain).    Marland Kitchen aspirin 325 MG tablet Take 1 tablet (325 mg total) by mouth daily.    . butalbital-acetaminophen-caffeine (FIORICET, ESGIC) 50-325-40 MG tablet Take 1 tablet by mouth every 12 (twelve) hours as needed for headache. 14 tablet 0  . clopidogrel (PLAVIX) 75 MG tablet Take 1 tablet (75 mg total) by mouth daily. 30 tablet 1  . divalproex (DEPAKOTE) 500 MG DR tablet Take 1 tablet (500 mg total) by mouth at bedtime. 30 tablet 0  . feeding supplement, ENSURE ENLIVE, (ENSURE ENLIVE) LIQD Take 237 mLs by mouth 2 (two) times daily between meals. 237 mL 0  . furosemide (LASIX) 20 MG tablet Take 20 mg by mouth daily.   0  . latanoprost (XALATAN) 0.005 % ophthalmic solution Place 1 drop into both eyes at bedtime.  0  . metoprolol tartrate (LOPRESSOR) 25 MG tablet Take 0.5 tablets (12.5 mg total) by mouth 2 (two) times daily. 30 tablet 8  . pantoprazole (PROTONIX) 40 MG tablet Take 1 tablet (40 mg total) by mouth daily. 30 tablet 0  . potassium chloride (MICRO-K) 10 MEQ CR capsule Take 2 capsules (20 mEq total) by mouth daily. 60 capsule 11  . pravastatin (PRAVACHOL) 80 MG tablet Take 80 mg by mouth at bedtime.      No current facility-administered medications for this visit.     Functional Status:  In your present state of health, do you have any difficulty performing the following activities: 03/03/2017 02/16/2017  Hearing? N -  Vision? Y -  Difficulty concentrating or making decisions? N -  Walking or climbing stairs? N -  Dressing or bathing? N -  Doing errands, shopping? N N   Preparing Food and eating ? N -  Using the Toilet? N -  In the past six months, have you accidently leaked urine? N -  Do you have problems with loss of bowel control? N -  Managing your Medications? N -  Managing your Finances? N -  Housekeeping or managing your Housekeeping? N -  Some recent data might be hidden    Fall/Depression Screening: Fall Risk  03/03/2017  Falls in the past year? Yes  Number falls in past yr: 1  Injury with Fall? No  Risk for fall due to : History of fall(s);Impaired balance/gait;Impaired mobility;Impaired vision;Medication side effect  Follow up Education provided;Falls prevention discussed;Follow up appointment   PHQ 2/9 Scores 03/03/2017 02/22/2017  PHQ - 2 Score 0 0   [facility] CM Care Plan Problem One     Most Recent Value  Care Plan Problem One  I just had a stroke  Role Documenting the Problem One  Care Management [place] for Problem One  Active  [facility] Long Term Goal   Patient will have no acute care admissions for stroke  [facility] Long Term Goal Start Date  03/03/17  Interventions for Problem One Long Term Goal  8/14 home viist to assess patient's progress.  Patient denies acute care admissions since last admissions  [facility] CM Short Term Goal #2   patient will be in attendance  to 100% of her medical appointments  [facility] CM Short Term Goal #2 Start Date  03/03/17  Interventions for Short Term Goal #2  8/14 patient reports 100% compliant with medical appointments     [facility] CM Care Plan Problem Two     Most Recent Value  Care Plan Problem Two  Patient will meet with this RNCM for Stroke Education  Role Documenting the Problem Two  Care Management [place] for Problem Two  Active  [facility] CM Short Term Goal #1   Patient will meet with [facility] RNCM for low sodium diet education  [facility] CM Short Term Goal #1 Start Date  03/03/17  [facility] CM Short Term Goal #1 Met Date   03/15/17  Interventions for Short Term Goal #2   8/14 routine home visit for low sodium  diet education     Assessment:  Patient was agreeable to participate in low sodium education. Patient has 1-2 plus left low extremity edema.  Patient endorses having eaten [place] sausage (for breakfast) and spaghetti for lunch. Patient denies shortness of breath or cough. Patient denies weighing each day, RNCM advised member on the need for daily weights and to record. Patient advised to adhere to her medication regimen, daily weights, report weight gain of 2-3 pounds in 24 hours to doctor,  Patient advised to seek emergency medical attention if she starts to have shortness of breath and/or chest pain.   Plan:  Telephone contact in the next 28 days for community care coordination

## 2017-03-17 DIAGNOSIS — N183 Chronic kidney disease, stage 3 (moderate): Secondary | ICD-10-CM | POA: Diagnosis not present

## 2017-03-17 DIAGNOSIS — I639 Cerebral infarction, unspecified: Secondary | ICD-10-CM | POA: Diagnosis not present

## 2017-03-17 DIAGNOSIS — M7989 Other specified soft tissue disorders: Secondary | ICD-10-CM | POA: Diagnosis not present

## 2017-03-17 DIAGNOSIS — I509 Heart failure, unspecified: Secondary | ICD-10-CM | POA: Diagnosis not present

## 2017-03-21 ENCOUNTER — Ambulatory Visit (INDEPENDENT_AMBULATORY_CARE_PROVIDER_SITE_OTHER): Payer: PPO | Admitting: *Deleted

## 2017-03-21 ENCOUNTER — Ambulatory Visit: Payer: PPO | Admitting: Physical Therapy

## 2017-03-21 ENCOUNTER — Encounter: Payer: Self-pay | Admitting: Physical Therapy

## 2017-03-21 DIAGNOSIS — M6281 Muscle weakness (generalized): Secondary | ICD-10-CM

## 2017-03-21 DIAGNOSIS — R42 Dizziness and giddiness: Secondary | ICD-10-CM

## 2017-03-21 DIAGNOSIS — I639 Cerebral infarction, unspecified: Secondary | ICD-10-CM

## 2017-03-21 DIAGNOSIS — R2689 Other abnormalities of gait and mobility: Secondary | ICD-10-CM

## 2017-03-21 NOTE — Patient Instructions (Addendum)
Perform these for strengthening of both legs:  Functional Quadriceps: Sit to Stand    Sit on edge of chair, feet flat on floor and arms either on hips as shown or across your chest. Stand upright, straightening  knees fully and then slowly sit back down. Repeat __10__ times per set. Do _1_ sets per session. Do _1-2_ sessions per day.  http://orth.exer.us/735   Copyright  VHI. All rights reserved.    Knee Extension: Resisted (Sitting)    With band looped around foot of leg out straight to anchor the band: pull other foot back stretching the band while bending that knee and then slowly bring foot back out straightening the knee. Repeat __10__ times each leg. Do _1_ sets per session. Do _1-2_ sessions per day.  http://orth.exer.us/690   Copyright  VHI. All rights reserved.   Perform these exercises for balance:  Perform these in a corner with a chair in front of you for safety: Feet Together (Compliant Surface) Varied Arm Positions - Eyes Closed    Stand on compliant surface: pillow/folded blanket with feet together and arms at sides. Close eyes and visualize upright position. Hold__30__ seconds. Repeat __2-3__ times per session. Do _1-2_ sessions per day.  Copyright  VHI. All rights reserved.    Feet Apart (Compliant Surface) Head Motion - Eyes Closed    Stand on compliant surface: pillow/folded blanket with feet shoulder width apart. Close eyes and move head slowly: 1. up and down x 10 reps 2. Left and right x 10 reps 3. Diagonal directions x 10 each way Do _1-2_ sessions per day.  Copyright  VHI. All rights reserved.   Perform these next to a stable surface or wall for balance:   "I love a Parade" Lift   At counter for balance as needed: high knee marching forward and then backward. 3 second pauses with each knee lift.  Repeat 3 laps each way. Do _1-2_ sessions per day. http://gt2.exer.us/345   Copyright  VHI. All rights reserved.   Walking on  Toes   At counter for balance as needed: Walk on toes forward while continuing on a straight path, and then backwards on toes to starting position. Repeat 3 laps each way. Do _1-2___ sessions per day.  Copyright  VHI. All rights reserved.  Feet Heel-Toe "Tandem"   At counter: Arms at sides, walk a straight line forward bringing one foot directly in front of the other, and then a straight line backwards bringing one foot directly behind the other one.  Repeat for _3 laps each way. Do _1-2_ sessions per day.  Copyright  VHI. All rights reserved.

## 2017-03-21 NOTE — Therapy (Signed)
[facility] [address] [place], [place], [address] Phone: [phone]   Fax:  [phone]  Physical Therapy Treatment  Patient Details  Name: Katie [name] MRN: [id number] Date of Birth: August 08, 1945 Referring Provider: Dr. Evangeline Gula (in hospital), will see Dr. Erlinda Hong next month.  Encounter Date: 03/21/2017      PT End of Session - 03/21/17 0934    Visit Number 2   Number of Visits 9  pt can only come 1x/week 2/2 finances   Date for PT Re-Evaluation 04/07/17   Authorization Type Health team Medicare: g-code and PN every 10th visit.    PT Start Time [phone]   PT Stop Time 1015   PT Time Calculation (min) 42 min   Equipment Utilized During Treatment Gait belt   Activity Tolerance Patient tolerated treatment well   Behavior During Therapy WFL for tasks assessed/performed      Past Medical History:  Diagnosis Date  . Chronic combined systolic and diastolic CHF (congestive heart failure) ([place])   . 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 History of kidney cancer    a. s/p right nephrectomy ~2007.  Marland Kitchen History of loop recorder   . Hypercholesterolemia   . Hypertension   . Mitral regurgitation   . Pulmonary hypertension ([place])   . Stroke [facility])     Past Surgical History:  Procedure Laterality Date  . CARDIAC CATHETERIZATION N/A 01/12/2016   Procedure: Right/Left Heart Cath and Coronary/Graft Angiography;  Surgeon: Belva Crome, MD;  Location: [facility];  Service: Cardiovascular;  Laterality: N/A;  . CERVICAL POLYPECTOMY N/A 04/23/2013   Procedure: CERVICAL POLYPECTOMY;  Surgeon: Maeola Sarah. Landry Mellow, MD;  Location: [facility];  Service: Gynecology;  Laterality: N/A;  Possible Polypectomy  . HYSTEROSCOPY W/D&C N/A 04/23/2013   Procedure: DILATATION AND CURETTAGE /HYSTEROSCOPY;  Surgeon: Maeola Sarah. Landry Mellow, MD;  Location: [facility];  Service: Gynecology;  Laterality: N/A;  . IR ANGIO INTRA  EXTRACRAN SEL COM CAROTID INNOMINATE BILAT MOD SED  02/11/2017  . IR ANGIO VERTEBRAL SEL VERTEBRAL BILAT MOD SED  02/11/2017  . KNEE ARTHROSCOPY Left   . LOOP RECORDER INSERTION N/A 02/18/2017   Procedure: Loop Recorder Insertion;  Surgeon: Constance Haw, MD;  Location: [facility];  Service: Cardiovascular;  Laterality: N/A;  . NEPHRECTOMY Right    due to carcinoma  . TEE WITHOUT CARDIOVERSION N/A 02/18/2017   Procedure: TRANSESOPHAGEAL ECHOCARDIOGRAM (TEE) WITH LOOP;  Surgeon: Acie Fredrickson Wonda Cheng, MD;  Location: [facility];  Service: Cardiovascular;  Laterality: N/A;    There were no vitals filed for this visit.      Subjective Assessment - 03/21/17 0933    Subjective No new complaints. No falls or pain to report.    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   Patient Stated Goals "Get back to how I used to be."   Currently in Pain? No/denies     treatment: Issued the following to HEP today with min guard assist for balance exercises. Cues provided on correct exercise form and technique.  Perform these for strengthening of both legs:  Functional Quadriceps: Sit to Stand    Sit on edge of chair, feet flat on floor  and arms either on hips as shown or across your chest. Stand upright, straightening  knees fully and then slowly sit back down. Repeat __10__ times per set. Do _1_ sets per session. Do _1-2_ sessions per day.  http://orth.exer.us/735   Copyright  VHI. All rights reserved.    Knee Extension: Resisted (Sitting)    With band looped around foot of leg out straight to anchor the band: pull other foot back stretching the band while bending that knee and then slowly bring foot back out straightening the knee. Repeat __10__ times each leg. Do _1_  sets per session. Do _1-2_ sessions per day.  http://orth.exer.us/690   Copyright  VHI. All rights reserved.   Perform these exercises for balance:  Perform these in a corner with a chair in front of you for safety: Feet Together (Compliant Surface) Varied Arm Positions - Eyes Closed    Stand on compliant surface: pillow/folded blanket with feet together and arms at sides. Close eyes and visualize upright position. Hold__30__ seconds. Repeat __2-3__ times per session. Do _1-2_ sessions per day.  Copyright  VHI. All rights reserved.    Feet Apart (Compliant Surface) Head Motion - Eyes Closed    Stand on compliant surface: pillow/folded blanket with feet shoulder width apart. Close eyes and move head slowly: 1. up and down x 10 reps 2. Left and right x 10 reps 3. Diagonal directions x 10 each way Do _1-2_ sessions per day.  Copyright  VHI. All rights reserved.   Perform these next to a stable surface or wall for balance:   "I love a Parade" Lift   At counter for balance as needed: high knee marching forward and then backward. 3 second pauses with each knee lift.  Repeat 3 laps each way. Do _1-2_ sessions per day. http://gt2.exer.us/345   Copyright  VHI. All rights reserved.   Walking on Toes   At counter for balance as needed: Walk on toes forward while continuing on a straight path, and then backwards on toes to starting position. Repeat 3 laps each way. Do _1-2___ sessions per day.  Copyright  VHI. All rights reserved.  Feet Heel-Toe "Tandem"   At counter: Arms at sides, walk a straight line forward bringing one foot directly in front of the other, and then a straight line backwards bringing one foot directly behind the other one.  Repeat for _3 laps each way. Do _1-2_ sessions per day.  Copyright  VHI. All rights reserved.         PT Education - 03/21/17 1005    Education provided Yes   Education Details HEP for strengthening and Pension scheme manager)  Educated Patient;Child(ren)   Methods Explanation;Demonstration;Verbal cues;Handout   Comprehension Verbalized understanding;Returned demonstration;Verbal cues required;Need further instruction          PT Short Term Goals - 03/08/17 1141      PT SHORT TERM GOAL #1   Title same as LTGs           PT Long Term Goals - 03/08/17 1141      PT LONG TERM GOAL #1   Title Pt will be IND in HEP to improve balance, strength, and dizziness. TARGET DATE FOR ALL LTGS: 04/05/17   Status New     PT LONG TERM GOAL #2   Title Pt will amb. 1000' over even/uneven terrain while performing head turns, IND, to improve functional mobility.    Status New     PT LONG TERM GOAL #3  Title Pt will improve FGA score to >/=27/30 to decr. falls risk.    Status New     PT LONG TERM GOAL #4   Title Pt will report 0/10 dizziness during activities with eyes closed and during head turns to improve safety during mobility.    Status New             Plan - 03/21/17 0934    Clinical Impression Statement Today's skilled sessoin focused on establishment of an HEP to address LE strengthening and balance. Pt was able to perform HEP in session without any issues. Pt should benefit from continued PT to progress toward unmet goals.    Rehab Potential Excellent   Clinical Impairments Affecting Rehab Potential see above   PT Frequency 2x / week  pt can only come 1x/week 2/2 finances   PT Duration 4 weeks   PT Treatment/Interventions ADLs/Self Care Home Management;Biofeedback;Canalith Repostioning;Neuromuscular re-education;Balance training;Therapeutic exercise;Therapeutic activities;Manual techniques;Vestibular;Functional mobility training;Gait training;Stair training;Orthotic Fit/Training;Patient/family education   PT Next Visit Plan  Vestibular assessment prn, continue to address LE strengthening, balance with emphasis on increased vestibular imput and gait including barriers   Consulted and Agree with Plan of  Care Patient;Family member/caregiver   Family Member Consulted son      Patient will benefit from skilled therapeutic intervention in order to improve the following deficits and impairments:  Abnormal gait, Decreased endurance, Decreased balance, Decreased mobility, Dizziness, Postural dysfunction, Decreased strength  Visit Diagnosis: Other abnormalities of gait and mobility  Dizziness and giddiness  Muscle weakness (generalized)     Problem List Patient Active Problem List   Diagnosis Date Noted  . Chronic migraine without aura without status migrainosus, not intractable   . Hypertension 02/15/2017  . Acute on chronic diastolic CHF (congestive heart failure) ([place]) 02/15/2017  . Basilar artery occlusion   . Cerebellar stroke ([place]) 02/10/2017  . Headache 02/10/2017  . Chest pain   . Moderate mitral regurgitation 01/08/2016  . Moderate tricuspid regurgitation 01/08/2016  . Moderate to severe pulmonary hypertension ([place]) 01/08/2016  . Acute CHF (congestive heart failure) ([place]) 01/07/2016  . Elevated troponin 01/07/2016  . Mild hypertension 01/07/2016  . CKD (chronic kidney disease), stage III 01/07/2016  . Hyperlipidemia 01/07/2016  . Acute CHF ([place]) 01/07/2016    Willow Ora, PTA, [facility] [address] [place], [place] [address] [phone] 03/21/17, 4:36 PM   Name: Katie [name] MRN: [id number] Date of Birth: 1946-06-09

## 2017-03-22 NOTE — Progress Notes (Signed)
Carelink Summary Report / Loop Recorder 

## 2017-03-23 NOTE — Progress Notes (Signed)
Cardiology Office Note    Date:  03/24/2017   ID:  Katie [name], Katie [name] Dec 07, 1945, MRN [id number]  PCP:  Lady Deutscher, MD  Cardiologist:  Dr. Acie Fredrickson   Chief Complaint: CHF follow up  History of Present Illness:   Katie [name] is a 71 y.o. female chronic combined CHF, mitral regurgitation, CKD stage III, single kidney (R nephrectomy 2/2 ca), HTN, HLD, pulm HTN, recent stroke s/p ILR  presents for CHF follow up.   She has history of prior admission for SOB in 2017 with fluid overload. D-dimer was elevated. VQ scan showed low probability of PE. CXR showed increased interstitial opacity and new effusions  Echo showed normal LVEF of 60-65%, grade 3 DD,  moderate MR, with a reversible restrictive pattern indicative of decreased LV diastolic compliance. 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 Cath that admission showed normal cors, EF 35-40%, and elevated pulmonary artery pressures of 23 mmHg, no significant mitral regurgitation despite echo findings.   She was admitted 7/12-7/14/18 with headache and found to have acute stroke (left SCA small infarction due to absent flow at distal BA and both SCAs and both PCAs, embolic pattern, etiology not clear). She was placed on aspirin 325mg  daily (previously 81mg ) and Plavix 75mg  daily with plan for DAPT for 3 months then Plavix alone. 2D echo was performed showing mild LVH, EF 55-60%, grade 3 DD, mild MVP with moderate TR, mod LAE, mild RAE, moderate TR, mild PR, PASP 65mmHg. Outpatient TEE was recommended.   She returned to the hospital 7/17-7/20/18 with chest pain suspected due to acute on chronic diastolic CHF versus GI. TEE 02/18/17 showed EF 45-50%, moderate-severe MR, mild-moderate TR, and she had implantable loop recorder placed 02/18/17. Last labs 02/18/17 showed Cr 1.53, K 4.2, troponin peak 0.07 (low and flat in 2017 as well), BNP 538, Hgb 12.6.  Seen by  Melina Copa 02/24/17. She was doing well on cardiac stand point. After discussion with DOD Dr. Ron Agee plan made to defer referral to TCTS for mitral valve repair given decline in EF on TEE. This will give her time to recover from stroke.   Here today for follow up. She complains of tired and fatigue first thing in morning and before going to bed. Also has intermittent chest pressure with and without exertion lasting for around 5 minutes with self resolution. Noted LE edema and orthopnea--> started on Lasix 40mg  x 3 day and now on lasix 20mg  daily with improved symptoms. She denies palpitations, melena or blood in her stool or urine. She is doing once a week physical therapy.    Past Medical History:  Diagnosis Date  . Chronic combined systolic and diastolic CHF (congestive heart failure) ([place])   . 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 History of kidney cancer    a. s/p right nephrectomy ~2007.  Marland Kitchen History of loop recorder   . Hypercholesterolemia   . Hypertension   . Mitral regurgitation   . Pulmonary hypertension ([place])   . Stroke [facility])     Past Surgical History:  Procedure Laterality Date  . CARDIAC CATHETERIZATION N/A 01/12/2016   Procedure: Right/Left Heart Cath and Coronary/Graft Angiography;  Surgeon: Belva Crome, MD;  Location: [facility];  Service: Cardiovascular;  Laterality: N/A;  . CERVICAL POLYPECTOMY N/A 04/23/2013   Procedure: CERVICAL  POLYPECTOMY;  Surgeon: Maeola Sarah. Landry Mellow, MD;  Location: [facility];  Service: Gynecology;  Laterality: N/A;  Possible Polypectomy  . HYSTEROSCOPY W/D&C N/A 04/23/2013   Procedure: DILATATION AND CURETTAGE /HYSTEROSCOPY;  Surgeon: Maeola Sarah. Landry Mellow, MD;  Location: [facility];  Service: Gynecology;  Laterality: N/A;  . IR ANGIO INTRA EXTRACRAN SEL COM CAROTID INNOMINATE BILAT MOD SED  02/11/2017  . IR ANGIO VERTEBRAL SEL VERTEBRAL BILAT MOD SED  02/11/2017  . KNEE ARTHROSCOPY Left   . LOOP RECORDER INSERTION  N/A 02/18/2017   Procedure: Loop Recorder Insertion;  Surgeon: Constance Haw, MD;  Location: [place] [facility];  Service: Cardiovascular;  Laterality: N/A;  . NEPHRECTOMY Right    due to carcinoma  . TEE WITHOUT CARDIOVERSION N/A 02/18/2017   Procedure: TRANSESOPHAGEAL ECHOCARDIOGRAM (TEE) WITH LOOP;  Surgeon: Acie Fredrickson Wonda Cheng, MD;  Location: [facility];  Service: Cardiovascular;  Laterality: N/A;    Current Medications: Prior to Admission medications   Medication Sig Start Date End Date Taking? Authorizing Provider  acetaminophen (TYLENOL) 500 MG tablet Take 1,000 mg by mouth every 6 (six) hours as needed for headache (pain).    [provider]  aspirin 325 MG tablet Take 1 tablet (325 mg total) by mouth daily. 02/17/17   Geradine Girt, DO  butalbital-acetaminophen-caffeine (FIORICET, ESGIC) [phone] MG tablet Take 1 tablet by mouth every 12 (twelve) hours as needed for headache. 02/17/17   Geradine Girt, DO  clopidogrel (PLAVIX) 75 MG tablet Take 1 tablet (75 mg total) by mouth daily. 02/13/17   Lady Deutscher, MD  divalproex (DEPAKOTE) 500 MG DR tablet Take 1 tablet (500 mg total) by mouth at bedtime. 02/17/17   Geradine Girt, DO  feeding supplement, ENSURE ENLIVE, (ENSURE ENLIVE) LIQD Take 237 mLs by mouth 2 (two) times daily between meals. 02/17/17   Geradine Girt, DO  furosemide (LASIX) 20 MG tablet Take 20 mg by mouth daily.  09/09/16   [provider]  latanoprost (XALATAN) 0.005 % ophthalmic solution Place 1 drop into both eyes at bedtime. 02/01/17   [provider]  metoprolol tartrate (LOPRESSOR) 25 MG tablet Take 0.5 tablets (12.5 mg total) by mouth 2 (two) times daily. 01/24/17   Arbutus Leas, NP  pantoprazole (PROTONIX) 40 MG tablet Take 1 tablet (40 mg total) by mouth daily. 02/17/17 02/17/18  Geradine Girt, DO  potassium chloride (MICRO-K) 10 MEQ CR capsule Take 2 capsules (20 mEq total) by mouth daily. 07/30/16   Belva Crome, MD    pravastatin (PRAVACHOL) 80 MG tablet Take 80 mg by mouth at bedtime.     [provider]    Allergies:   Tramadol   Social History   Social History  . Marital status: Divorced    Spouse name: N/A  . Number of children: N/A  . Years of education: N/A   Social History Main Topics  . Smoking status: Never Smoker  . Smokeless tobacco: Never Used  . Alcohol use No  . Drug use: No  . Sexual activity: Not Asked   Other Topics Concern  . None   Social History Narrative  . None     Family History:  The patient's family history includes Diabetes Mellitus II in her sister; Stroke in her brother.  ROS:   Please see the history of present illness.    ROS All other systems reviewed and are negative.   PHYSICAL EXAM:   VS:  BP 124/84   Pulse  86   Ht 5\' 3"  (1.6 m)   Wt 163 lb 12.8 oz (74.3 kg)   LMP 02/12/2013   SpO2 96%   BMI 29.02 kg/m    GEN: Well nourished, well developed, in no acute distress  HEENT: normal  Neck: no carotid bruits, or masses. + JVD Cardiac: RRR; hold systolic 3/6 murmurs, rubs, or gallops, trace BL LE edema  Respiratory:  clear to auscultation bilaterally, normal work of breathing GI: soft, nontender, nondistended, + BS MS: no deformity or atrophy  Skin: warm and dry, no rash Neuro:  Alert and Oriented x 3, Strength and sensation are intact Psych: euthymic mood, full affect  Wt Readings from Last 3 Encounters:  03/24/17 163 lb 12.8 oz (74.3 kg)  02/24/17 157 lb 1.9 oz (71.3 kg)  02/18/17 154 lb 9.6 oz (70.1 kg)      Studies/Labs Reviewed:   EKG:  EKG is not ordered today.    Recent Labs: 02/14/2017: B Natriuretic Peptide 538.0 02/24/2017: ALT 15; BUN 22; Creatinine, Ser 1.45; Hemoglobin 13.2; Platelets 302; Potassium 4.2; Sodium 142   Lipid Panel    Component Value Date/Time   CHOL 159 02/10/2017 0900   TRIG 66 02/10/2017 0900   HDL 58 02/10/2017 0900   CHOLHDL 2.7 02/10/2017 0900   VLDL 13 02/10/2017 0900   LDLCALC 88  02/10/2017 0900    Additional studies/ records that were reviewed today include:   TEE 02/18/17 Study Conclusions  - Left ventricle: Systolic function was mildly reduced. The   estimated ejection fraction was in the range of 45% to 50%. - Aortic valve: No evidence of vegetation. - Mitral valve: There was moderate to severe regurgitation. - Left atrium: No evidence of thrombus in the atrial cavity or   appendage. - Tricuspid valve: There was mild-moderate regurgitation.  TTE 02/12/17 Study Conclusions  - Left ventricle: The cavity size was at the upper limits of   normal. There was mild concentric hypertrophy. Systolic function   was normal. The estimated ejection fraction was in the range of   55% to 60%. Wall motion was normal; there were no regional wall   motion abnormalities. There was a reduced contribution of atrial   contraction to ventricular filling, due to increased ventricular   diastolic pressure or atrial contractile dysfunction. Doppler   parameters are consistent with a reversible restrictive pattern,   indicative of decreased left ventricular diastolic compliance   and/or increased left atrial pressure (grade 3 diastolic   dysfunction). Doppler parameters are consistent with high   ventricular filling pressure. - Aortic valve: Trileaflet; mildly thickened, mildly calcified   leaflets. - Mitral valve: Mild diffuse thickening of the anterior leaflet.   Mild, late systolicprolapse, involving the anterior leaflet.   There was moderate regurgitation. - Left atrium: The atrium was moderately dilated. - Right atrium: The atrium was mildly dilated. - Tricuspid valve: There was moderate regurgitation. - Pulmonic valve: There was mild regurgitation. - Pulmonary arteries: PA peak pressure: 61 mm Hg (S).  Impressions:  - The right ventricular systolic pressure was increased consistent   with moderate pulmonary hypertension.  Right/Left Heart Cath and  Coronary/Graft Angiography 01/12/16  Conclusion    Widely patent/normal coronary arteries.  Mildly to moderately reduced left ventricular systolic function with EF 35-40%. Filling pressures are normal. No mitral regurgitation of significance is noted.  Mildly elevated pulmonary artery pressures with mean PA pressure of 23 mmHg.   RECOMMENDATIONS:   Heart failure therapy to be continued by the  primary team.  Overall prognosis for improvement is optimistic.       ASSESSMENT & PLAN:    1. Chronic combined CHF - Her volume status improved on diuretics. Mild overload today. Take lasix 40mg  today and then back to regular dose of lasix 20mg  qd. Get BMET today.   2. Mitral regurgitation - Moderate to severe with EF of 45-50% on TEE 02/18/17. EF was 55-60% on TTE on 02/12/17. She is symptomatic and will benefits for surgical evaluation. Normal coronaries by cath 01/2016.   3. Recent stroke - Aspirin 325mg  daily (previously 81mg ) and Plavix 75mg  daily with plan for DAPT for 3 months then Plavix alone. Per neurology.   4. CKD stage III, single kidney (R nephrectomy 2/2 ca) - Baseline Scr of 1.4-1.5. Check BMET given recent initiation of diuretics.   5. HTN - Controlled on current regimen.   Medication Adjustments/Labs and Tests Ordered: Current medicines are reviewed at length with the patient today.  Concerns regarding medicines are outlined above.  Medication changes, Labs and Tests ordered today are listed in the Patient Instructions below. Patient Instructions  Medication Instructions:  Your physician recommends that you continue on your current medications as directed. Please refer to the Current Medication list given to you today.   Labwork: LABS TODAY: BMET  Testing/Procedures: None ordered  Follow-Up: You have been referred to Cardiothoracic Surgery for Mitral Valve Regurgitation   Your physician wants you to follow-up in: 2-3 months with Dr. Acie Fredrickson   Any Other  Special Instructions Will Be Listed Below (If Applicable).     If you need a refill on your cardiac medications before your next appointment, please call your pharmacy.      Jarrett Soho, [place]  03/24/2017 8:56 AM    [facility] [place], [place], [place]  [address] Phone: [phone]; Fax: [phone]   Attending Note:   The patient was seen and examined.  Agree with assessment and plan as noted above.  Changes made to the above note as needed.  Patient seen and independently examined with Robbie Lis, PA .   We discussed all aspects of the encounter. I agree with the assessment and plan as stated above.  1.   CHF - Stacye has CHF symptoms due to her reduced LV EF and her severe MR . I agree that she needs to be referred to TCTS for consideration for valve repair / replacement .  She had normal coronaries a year.   She may need another cath  - I would defer to the surgeons.    I have spent a total of 40 minutes with patient reviewing hospital  notes , telemetry, EKGs, labs and examining patient as well as establishing an assessment and plan that was discussed with the patient. > 50% of time was spent in direct patient care.    Thayer Headings, Brooke Bonito., MD, [facility] 03/25/2017, 10:30 AM [address]  [place] Pager [phone]

## 2017-03-24 ENCOUNTER — Telehealth: Payer: Self-pay | Admitting: Neurology

## 2017-03-24 ENCOUNTER — Ambulatory Visit (INDEPENDENT_AMBULATORY_CARE_PROVIDER_SITE_OTHER): Payer: PPO | Admitting: Physician Assistant

## 2017-03-24 ENCOUNTER — Encounter: Payer: Self-pay | Admitting: Physician Assistant

## 2017-03-24 ENCOUNTER — Other Ambulatory Visit: Payer: Self-pay

## 2017-03-24 VITALS — BP 124/84 | HR 86 | Ht 63.0 in | Wt 163.8 lb

## 2017-03-24 DIAGNOSIS — I340 Nonrheumatic mitral (valve) insufficiency: Secondary | ICD-10-CM | POA: Diagnosis not present

## 2017-03-24 DIAGNOSIS — N183 Chronic kidney disease, stage 3 unspecified: Secondary | ICD-10-CM

## 2017-03-24 DIAGNOSIS — I10 Essential (primary) hypertension: Secondary | ICD-10-CM

## 2017-03-24 DIAGNOSIS — I5042 Chronic combined systolic (congestive) and diastolic (congestive) heart failure: Secondary | ICD-10-CM

## 2017-03-24 LAB — BASIC METABOLIC PANEL
BUN/Creatinine Ratio: 14 (ref 12–28)
BUN: 19 mg/dL (ref 8–27)
CO2: 24 mmol/L (ref 20–29)
Calcium: 9.8 mg/dL (ref 8.7–10.3)
Chloride: 105 mmol/L (ref 96–106)
Creatinine, Ser: 1.38 mg/dL — ABNORMAL HIGH (ref 0.57–1.00)
GFR calc Af Amer: 44 mL/min/{1.73_m2} — ABNORMAL LOW (ref >59)
GFR calc non Af Amer: 38 mL/min/{1.73_m2} — ABNORMAL LOW (ref >59)
Glucose: 88 mg/dL (ref 65–99)
Potassium: 4.7 mmol/L (ref 3.5–5.2)
Sodium: 143 mmol/L (ref 134–144)

## 2017-03-24 MED ORDER — PANTOPRAZOLE SODIUM 40 MG PO TBEC: 40.0000 mg | DELAYED_RELEASE_TABLET | Freq: Every day | ORAL | 0 refills | Status: DC

## 2017-03-24 MED ORDER — DIVALPROEX SODIUM 500 MG PO DR TAB: 500.0000 mg | DELAYED_RELEASE_TABLET | Freq: Every day | ORAL | 0 refills | Status: DC

## 2017-03-24 NOTE — Patient Instructions (Signed)
Medication Instructions:  Your physician recommends that you continue on your current medications as directed. Please refer to the Current Medication list given to you today.   Labwork: LABS TODAY: BMET  Testing/Procedures: None ordered  Follow-Up: You have been referred to Cardiothoracic Surgery for Mitral Valve Regurgitation   Your physician wants you to follow-up in: 2-3 months with Dr. Acie Fredrickson   Any Other Special Instructions Will Be Listed Below (If Applicable).     If you need a refill on your cardiac medications before your next appointment, please call your pharmacy.

## 2017-03-24 NOTE — Telephone Encounter (Signed)
Refill will be done for depakote for 30 days until pt sees Dr. Leonie Man in September 2018.

## 2017-03-24 NOTE — Telephone Encounter (Signed)
Pt called in for refill of divalproex (DEPAKOTE) 500 MG DR tablet she confirmed she wanted it sent to [facility] on [address] in [place].  Pt made aware it will be for 30 days and that she needs to keep appointment on 9-24 and to check in at 8:00 for the 8:30 appointment

## 2017-03-26 LAB — CUP PACEART REMOTE DEVICE CHECK
Date Time Interrogation Session: 20180819181155
Implantable Pulse Generator Implant Date: 20180720

## 2017-03-26 NOTE — Progress Notes (Signed)
Carelink summary report received. Battery status OK. Normal device function. No new symptom episodes, tachy episodes, brady, or pause episodes. No new AF episodes. Monthly summary reports and ROV/PRN 

## 2017-03-30 ENCOUNTER — Encounter: Payer: Self-pay | Admitting: Physical Therapy

## 2017-03-30 ENCOUNTER — Ambulatory Visit: Payer: PPO | Admitting: Physical Therapy

## 2017-03-30 DIAGNOSIS — M6281 Muscle weakness (generalized): Secondary | ICD-10-CM

## 2017-03-30 DIAGNOSIS — R42 Dizziness and giddiness: Secondary | ICD-10-CM

## 2017-03-30 DIAGNOSIS — R2689 Other abnormalities of gait and mobility: Secondary | ICD-10-CM | POA: Diagnosis not present

## 2017-03-31 ENCOUNTER — Other Ambulatory Visit: Payer: Self-pay

## 2017-03-31 NOTE — Patient Outreach (Unsigned)
[facility]) Care Management  03/31/2017   Katie [name] Dec 16, 1945 [id number]  Subjective:   Objective:   Current Medications:  Current Outpatient Prescriptions  Medication Sig Dispense Refill  . acetaminophen (TYLENOL) 500 MG tablet Take 1,000 mg by mouth every 6 (six) hours as needed for headache (pain).    Marland Kitchen aspirin 325 MG tablet Take 1 tablet (325 mg total) by mouth daily.    . butalbital-acetaminophen-caffeine (FIORICET, ESGIC) 50-325-40 MG tablet Take 1 tablet by mouth every 12 (twelve) hours as needed for headache. 14 tablet 0  . clopidogrel (PLAVIX) 75 MG tablet Take 1 tablet (75 mg total) by mouth daily. 30 tablet 1  . divalproex (DEPAKOTE) 500 MG DR tablet Take 1 tablet (500 mg total) by mouth at bedtime. 30 tablet 0  . feeding supplement, ENSURE ENLIVE, (ENSURE ENLIVE) LIQD Take 237 mLs by mouth 2 (two) times daily between meals. 237 mL 0  . furosemide (LASIX) 20 MG tablet Take 20 mg by mouth daily.   0  . latanoprost (XALATAN) 0.005 % ophthalmic solution Place 1 drop into both eyes at bedtime.  0  . metoprolol tartrate (LOPRESSOR) 25 MG tablet Take 0.5 tablets (12.5 mg total) by mouth 2 (two) times daily. 30 tablet 8  . pantoprazole (PROTONIX) 40 MG tablet Take 1 tablet (40 mg total) by mouth daily. 30 tablet 0  . potassium chloride (MICRO-K) 10 MEQ CR capsule Take 2 capsules (20 mEq total) by mouth daily. 60 capsule 11  . pravastatin (PRAVACHOL) 80 MG tablet Take 80 mg by mouth at bedtime.      No current facility-administered medications for this visit.     Functional Status:  In your present state of health, do you have any difficulty performing the following activities: 03/03/2017 02/16/2017  Hearing? N -  Vision? Y -  Difficulty concentrating or making decisions? N -  Walking or climbing stairs? N -  Dressing or bathing? N -  Doing errands, shopping? N N  Preparing Food and eating ? N -  Using the Toilet? N -  In the past six months, have you  accidently leaked urine? N -  Do you have problems with loss of bowel control? N -  Managing your Medications? N -  Managing your Finances? N -  Housekeeping or managing your Housekeeping? N -  Some recent data might be hidden    Fall/Depression Screening: Fall Risk  03/03/2017  Falls in the past year? Yes  Number falls in past yr: 1  Injury with Fall? No  Risk for fall due to : History of fall(s);Impaired balance/gait;Impaired mobility;Impaired vision;Medication side effect  Follow up Education provided;Falls prevention discussed;Follow up appointment   PHQ 2/9 Scores 03/03/2017 02/22/2017  PHQ - 2 Score 0 0    Assessment:   Plan:

## 2017-03-31 NOTE — Therapy (Signed)
[facility] [address] [place], [place], [address] Phone: [phone]   Fax:  [phone]  Physical Therapy Treatment  Patient Details  Name: Katie [name] MRN: [id number] Date of Birth: 03-10-1946 Referring Provider: Dr. Evangeline Gula (in hospital), will see Dr. Erlinda Hong next month.  Encounter Date: 03/30/2017      PT End of Session - 03/30/17 0824    Visit Number 3   Number of Visits 9  pt can only come 1x/week 2/2 finances   Date for PT Re-Evaluation 04/07/17   Authorization Type Health team Medicare: g-code and PN every 10th visit.    PT Start Time 0805   PT Stop Time 0845   PT Time Calculation (min) 40 min   Equipment Utilized During Treatment Gait belt   Activity Tolerance Patient tolerated treatment well   Behavior During Therapy WFL for tasks assessed/performed      Past Medical History:  Diagnosis Date  . Chronic combined systolic and diastolic CHF (congestive heart failure) ([place])   . 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 History of kidney cancer    a. s/p right nephrectomy ~2007.  Marland Kitchen History of loop recorder   . Hypercholesterolemia   . Hypertension   . Mitral regurgitation   . Pulmonary hypertension ([place])   . Stroke [facility])     Past Surgical History:  Procedure Laterality Date  . CARDIAC CATHETERIZATION N/A 01/12/2016   Procedure: Right/Left Heart Cath and Coronary/Graft Angiography;  Surgeon: Belva Crome, MD;  Location: [place];  Service: Cardiovascular;  Laterality: N/A;  . CERVICAL POLYPECTOMY N/A 04/23/2013   Procedure: CERVICAL POLYPECTOMY;  Surgeon: Maeola Sarah. Landry Mellow, MD;  Location: [facility];  Service: Gynecology;  Laterality: N/A;  Possible Polypectomy  . HYSTEROSCOPY W/D&C N/A 04/23/2013   Procedure: DILATATION AND CURETTAGE /HYSTEROSCOPY;  Surgeon: Maeola Sarah. Landry Mellow, MD;  Location: [place] [facility];  Service: Gynecology;  Laterality: N/A;  . IR ANGIO INTRA  EXTRACRAN SEL COM CAROTID INNOMINATE BILAT MOD SED  02/11/2017  . IR ANGIO VERTEBRAL SEL VERTEBRAL BILAT MOD SED  02/11/2017  . KNEE ARTHROSCOPY Left   . LOOP RECORDER INSERTION N/A 02/18/2017   Procedure: Loop Recorder Insertion;  Surgeon: Constance Haw, MD;  Location: [facility];  Service: Cardiovascular;  Laterality: N/A;  . NEPHRECTOMY Right    due to carcinoma  . TEE WITHOUT CARDIOVERSION N/A 02/18/2017   Procedure: TRANSESOPHAGEAL ECHOCARDIOGRAM (TEE) WITH LOOP;  Surgeon: Acie Fredrickson Wonda Cheng, MD;  Location: [facility];  Service: Cardiovascular;  Laterality: N/A;    There were no vitals filed for this visit.      Subjective Assessment - 03/30/17 0824    Subjective No new complaints. No falls or pain to report. HEP is going well, still challenging. Wearing off shelf wrist braces today as she reports her carpal tunnel is "acting out/sore"   Patient is accompained by: Family member   Pertinent History HTN, pulmonary HTN, HLD, CHF, hx of R renal CA s/p kidney removed in 2007, loop recorder, mitral valve regurgitation, recent imaging found occluded: distal basilar artery, B ACA and B PCA.   Patient Stated Goals "Get back to how I used to be."   Currently in Pain? No/denies            [facility] Adult PT Treatment/Exercise - 03/30/17 0826      High Level Balance   High Level Balance Activities Marching forwards;Marching  backwards;Tandem walking   High Level Balance Comments red mats next to counter: perfored each one for 3 laps each way with intermittent UE support on counter and up to min assist for balance. cues on posture and weight shifting to assist with balance as well              Balance Exercises - 03/30/17 0826      Balance Exercises: Standing   SLS with Vectors Foam/compliant surface;Other reps (comment);Limitations   Rockerboard Anterior/posterior;Lateral;Head turns;EO;EC;30 seconds;10 reps   Balance Beam standing across blue foam beam: narrow base of  support with EC no head movements 30 seconds x 2 reps with min to mod assist and cues for anterior weight shifting to assist with balance     Balance Exercises: Standing   SLS with Vectors Limitations red mats with 6 cones along edges of mats: alternating fwd toe taps to each cone with side stepping left<>right x 1 lap each way, then alteranting fwd double toe taps to each cone with side stepping left<>right each way x 1 lap. min guard to min assist for balance with cues on base of support, posture and weight shifting to assist with balance.    Rebounder Limitations performed both ways on balance board in corner with chair in front for safety: EO rocking board with emphasis on tall posture; holding board steady: EC no head movements progressing to EC head movements left<>right, up<>down and diagonals both ways. No UE support with min guard to min assist for balance with cues on posture and weight shifting for balance assistance.                           PT Short Term Goals - 03/08/17 1141      PT SHORT TERM GOAL #1   Title same as LTGs           PT Long Term Goals - 03/08/17 1141      PT LONG TERM GOAL #1   Title Pt will be IND in HEP to improve balance, strength, and dizziness. TARGET DATE FOR ALL LTGS: 04/05/17   Status New     PT LONG TERM GOAL #2   Title Pt will amb. 1000' over even/uneven terrain while performing head turns, IND, to improve functional mobility.    Status New     PT LONG TERM GOAL #3   Title Pt will improve FGA score to >/=27/30 to decr. falls risk.    Status New     PT LONG TERM GOAL #4   Title Pt will report 0/10 dizziness during activities with eyes closed and during head turns to improve safety during mobility.    Status New           Plan - 03/30/17 1750    Clinical Impression Statement Today's skilled session focused on high level balance and balance with vision removed today on compliant surfaces. Pt is progressing toward goals and should  benefit from continued PT to progress toward unmet goals.    Rehab Potential Excellent   Clinical Impairments Affecting Rehab Potential see above   PT Frequency 2x / week  pt can only come 1x/week 2/2 finances   PT Duration 4 weeks   PT Treatment/Interventions ADLs/Self Care Home Management;Biofeedback;Canalith Repostioning;Neuromuscular re-education;Balance training;Therapeutic exercise;Therapeutic activities;Manual techniques;Vestibular;Functional mobility training;Gait training;Stair training;Orthotic Fit/Training;Patient/family education   PT Next Visit Plan  Vestibular assessment prn, continue to address LE strengthening, balance with emphasis on increased vestibular imput  and gait including barriers   Consulted and Agree with Plan of Care Patient;Family member/caregiver   Family Member Consulted son      Patient will benefit from skilled therapeutic intervention in order to improve the following deficits and impairments:  Abnormal gait, Decreased endurance, Decreased balance, Decreased mobility, Dizziness, Postural dysfunction, Decreased strength  Visit Diagnosis: Other abnormalities of gait and mobility  Dizziness and giddiness  Muscle weakness (generalized)     Problem List Patient Active Problem List   Diagnosis Date Noted  . Chronic migraine without aura without status migrainosus, not intractable   . Hypertension 02/15/2017  . Acute on chronic diastolic CHF (congestive heart failure) ([place]) 02/15/2017  . Basilar artery occlusion   . Cerebellar stroke ([place]) 02/10/2017  . Headache 02/10/2017  . Chest pain   . Moderate mitral regurgitation 01/08/2016  . Moderate tricuspid regurgitation 01/08/2016  . Moderate to severe pulmonary hypertension ([place]) 01/08/2016  . Acute CHF (congestive heart failure) ([place]) 01/07/2016  . Elevated troponin 01/07/2016  . Mild hypertension 01/07/2016  . CKD (chronic kidney disease), stage III 01/07/2016  . Hyperlipidemia 01/07/2016  . Acute  CHF ([place]) 01/07/2016    Willow Ora, PTA, [facility] [address] [place], [place] [address] [phone] 03/31/17, 2:29 PM   Name: TAYEN NARANG MRN: [id number] Date of Birth: [birth year]

## 2017-04-05 ENCOUNTER — Ambulatory Visit: Payer: Medicare Other | Attending: Internal Medicine | Admitting: Physical Therapy

## 2017-04-05 ENCOUNTER — Encounter: Payer: Self-pay | Admitting: Physical Therapy

## 2017-04-05 DIAGNOSIS — M6281 Muscle weakness (generalized): Secondary | ICD-10-CM

## 2017-04-05 DIAGNOSIS — R2689 Other abnormalities of gait and mobility: Secondary | ICD-10-CM

## 2017-04-05 NOTE — Patient Instructions (Addendum)
Perform these for strengthening of both legs:  Functional Quadriceps: Sit to Stand    Sit on edge of chair, feet flat on floor on a pillow to add challege and arms either on hips as shown or across your chest. Stand upright, straightening  knees fully and then slowly sit back down. Repeat __10__ times per set. Do _1_ sets per session. Do _1-2_ sessions per day.  http://orth.exer.us/735    Perform these in the corner with a chair in front of you for safety:  Feet Together (Compliant Surface) Varied Arm Positions - Eyes Closed    Stand on compliant surface: _pillow/s_ with feet together and arms at sides Close eyes and visualize upright position. Hold___30_ seconds. Repeat __3__ times per session. Do __1-2__ sessions per day.  Copyright  VHI. All rights reserved.   Feet Together (Compliant Surface) Head Motion - Eyes Closed    Stand on compliant surface: __pillow/s_ with feet together. Close eyes and move head slowly: 1. Up and down  X 10 reps 2. Left and right x 10 reps 3. Up right/down left x 10 reps 4. Up left/down right x 10 reps Do _1-2___ sessions per day.  Copyright  VHI. All rights reserved.   WALKING  Walking is a great form of exercise to increase your strength, endurance and overall fitness.  A walking program can help you start slowly and gradually build endurance as you go.  Everyone's ability is different, so each person's starting point will be different.  You do not have to follow them exactly.  The are just samples. You should simply find out what's right for you and stick to that program.   In the beginning, you'll start off walking 2-3 times a day for short distances.  As you get stronger, you'll be walking further at just 1-2 times per day.  A. You Can Walk For A Certain Length Of Time Each Day    Walk 8-10 minutes 1-2 times per day.  Increase 1-2 minutes every 6-7 days   Work up to 25-30 minutes (1-2 times per day).     Please only do the  exercises that your therapist has initialed and dated

## 2017-04-05 NOTE — Therapy (Addendum)
[facility] [address] [place], [place], [address] Phone: [phone]   Fax:  [phone]  Physical Therapy Treatment  Patient Details  Name: Katie [name] MRN: [id number] Date of Birth: May 23, 1946 Referring Provider: Dr. Evangeline Gula (in hospital), will see Dr. Erlinda Hong next month.  Encounter Date: 04/05/2017      PT End of Session - 04/05/17 0850    Visit Number 4   Number of Visits 9  pt can only come 1x/week 2/2 finances   Date for PT Re-Evaluation 04/07/17   Authorization Type Health team Medicare: g-code and PN every 10th visit.    PT Start Time 0848   PT Stop Time 0929   PT Time Calculation (min) 41 min   Equipment Utilized During Treatment Gait belt   Activity Tolerance Patient tolerated treatment well   Behavior During Therapy WFL for tasks assessed/performed      Past Medical History:  Diagnosis Date  . Chronic combined systolic and diastolic CHF (congestive heart failure) ([place])   . 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 History of kidney cancer    a. s/p right nephrectomy ~2007.  Marland Kitchen History of loop recorder   . Hypercholesterolemia   . Hypertension   . Mitral regurgitation   . Pulmonary hypertension ([place])   . Stroke [facility])     Past Surgical History:  Procedure Laterality Date  . CARDIAC CATHETERIZATION N/A 01/12/2016   Procedure: Right/Left Heart Cath and Coronary/Graft Angiography;  Surgeon: Belva Crome, MD;  Location: [place] [facility];  Service: Cardiovascular;  Laterality: N/A;  . CERVICAL POLYPECTOMY N/A 04/23/2013   Procedure: CERVICAL POLYPECTOMY;  Surgeon: Maeola Sarah. Landry Mellow, MD;  Location: [facility];  Service: Gynecology;  Laterality: N/A;  Possible Polypectomy  . HYSTEROSCOPY W/D&C N/A 04/23/2013   Procedure: DILATATION AND CURETTAGE /HYSTEROSCOPY;  Surgeon: Maeola Sarah. Landry Mellow, MD;  Location: [facility];  Service: Gynecology;  Laterality: N/A;  . IR ANGIO INTRA  EXTRACRAN SEL COM CAROTID INNOMINATE BILAT MOD SED  02/11/2017  . IR ANGIO VERTEBRAL SEL VERTEBRAL BILAT MOD SED  02/11/2017  . KNEE ARTHROSCOPY Left   . LOOP RECORDER INSERTION N/A 02/18/2017   Procedure: Loop Recorder Insertion;  Surgeon: Constance Haw, MD;  Location: [place];  Service: Cardiovascular;  Laterality: N/A;  . NEPHRECTOMY Right    due to carcinoma  . TEE WITHOUT CARDIOVERSION N/A 02/18/2017   Procedure: TRANSESOPHAGEAL ECHOCARDIOGRAM (TEE) WITH LOOP;  Surgeon: Acie Fredrickson Wonda Cheng, MD;  Location: [facility];  Service: Cardiovascular;  Laterality: N/A;    There were no vitals filed for this visit.      Subjective Assessment - 04/05/17 0850    Subjective No new complaitns. No falls or pain to report today.    Patient is accompained by: Family member  son   Patient Stated Goals "Get back to how I used to be."   Currently in Pain? No/denies            [facility] PT Assessment - 04/05/17 0855      Transfers   Transfers Sit to Stand;Stand to Sit   Sit to Stand 7: Independent;Without upper extremity assist;From chair/3-in-1   Stand to Sit 7: Independent;Without upper extremity assist;To chair/3-in-1     Ambulation/Gait   Ambulation/Gait Yes   Ambulation/Gait Assistance 6: Modified independent (Device/Increase time)   Ambulation/Gait Assistance Details minor veering noted with gait, shuffled steps. no balance loss noted.  Ambulation Distance (Feet) 1000 Feet   Assistive device None   Gait Pattern Step-through pattern;Decreased stride length   Ambulation Surface Level;Unlevel;Indoor;Outdoor;Paved;Gravel;Grass     Functional Gait  Assessment   Gait assessed  Yes   Gait Level Surface Walks 20 ft in less than 7 sec but greater than 5.5 sec, uses assistive device, slower speed, mild gait deviations, or deviates 6-10 in outside of the 12 in walkway width.   Change in Gait Speed Able to smoothly change walking speed without loss of balance or gait deviation.  Deviate no more than 6 in outside of the 12 in walkway width.   Gait with Horizontal Head Turns Performs head turns smoothly with no change in gait. Deviates no more than 6 in outside 12 in walkway width   Gait with Vertical Head Turns Performs head turns with no change in gait. Deviates no more than 6 in outside 12 in walkway width.   Gait and Pivot Turn Pivot turns safely within 3 sec and stops quickly with no loss of balance.   Step Over Obstacle Is able to step over 2 stacked shoe boxes taped together (9 in total height) without changing gait speed. No evidence of imbalance.   Gait with Narrow Base of Support Is able to ambulate for 10 steps heel to toe with no staggering.   Gait with Eyes Closed Walks 20 ft, uses assistive device, slower speed, mild gait deviations, deviates 6-10 in outside 12 in walkway width. Ambulates 20 ft in less than 9 sec but greater than 7 sec.   Ambulating Backwards Walks 20 ft, uses assistive device, slower speed, mild gait deviations, deviates 6-10 in outside 12 in walkway width.   Steps Alternating feet, no rail.   Total Score 27   FGA comment: 27/30 socred today               PT Education - 04/05/17 1309    Education provided Yes   Education Details advanced HEP after review in session. No issues reported with performance of advanced HEP in session.    Person(s) Educated Patient   Methods Explanation;Demonstration;Handout;Verbal cues   Comprehension Verbalized understanding;Returned demonstration          PT Short Term Goals - 03/08/17 1141      PT SHORT TERM GOAL #1   Title same as LTGs           PT Long Term Goals - 04/05/17 0851      PT LONG TERM GOAL #1   Title Pt will be IND in HEP to improve balance, strength, and dizziness. TARGET DATE FOR ALL LTGS: 04/05/17   Baseline 04/05/17: met today with HEP advanced as needed   Status Achieved     PT LONG TERM GOAL #2   Title Pt will amb. 1000' over even/uneven terrain while performing  head turns, IND, to improve functional mobility.    Baseline 04/05/17: met today at mod I assist level   Status Achieved     PT LONG TERM GOAL #3   Title Pt will improve FGA score to >/=27/30 to decr. falls risk.    Baseline 04/05/17: 27/30 scored today   Status Achieved     PT LONG TERM GOAL #4   Title Pt will report 0/10 dizziness during activities with eyes closed and during head turns to improve safety during mobility.    Baseline 04/05/17: met today per pt report.   Status Achieved  Plan - 25-Apr-2017 0851    Clinical Impression Statement Pt has met all LTGs as of today and is agreeable to discharge at this time.   Rehab Potential Excellent   Clinical Impairments Affecting Rehab Potential see above   PT Frequency 2x / week  pt can only come 1x/week 2/2 finances   PT Duration 4 weeks   PT Treatment/Interventions ADLs/Self Care Home Management;Biofeedback;Canalith Repostioning;Neuromuscular re-education;Balance training;Therapeutic exercise;Therapeutic activities;Manual techniques;Vestibular;Functional mobility training;Gait training;Stair training;Orthotic Fit/Training;Patient/family education   PT Next Visit Plan discharge today.    Consulted and Agree with Plan of Care Patient;Family member/caregiver   Family Member Consulted son      Patient will benefit from skilled therapeutic intervention in order to improve the following deficits and impairments:  Abnormal gait, Decreased endurance, Decreased balance, Decreased mobility, Dizziness, Postural dysfunction, Decreased strength  Visit Diagnosis: Other abnormalities of gait and mobility  Muscle weakness (generalized)          G-Codes - 2017/04/25 1307    Functional Assessment Tool Used (Outpatient Only) FGA: 27/30   Functional Limitation Goal G-code: CI Discharge G-code: CI Mobility: Walking and moving around      Problem List Patient Active Problem List   Diagnosis Date Noted  . Chronic migraine without  aura without status migrainosus, not intractable   . Hypertension 02/15/2017  . Acute on chronic diastolic CHF (congestive heart failure) ([place]) 02/15/2017  . Basilar artery occlusion   . Cerebellar stroke ([place]) 02/10/2017  . Headache 02/10/2017  . Chest pain   . Moderate mitral regurgitation 01/08/2016  . Moderate tricuspid regurgitation 01/08/2016  . Moderate to severe pulmonary hypertension ([place]) 01/08/2016  . Acute CHF (congestive heart failure) ([place]) 01/07/2016  . Elevated troponin 01/07/2016  . Mild hypertension 01/07/2016  . CKD (chronic kidney disease), stage III 01/07/2016  . Hyperlipidemia 01/07/2016  . Acute CHF ([place]) 01/07/2016    Willow Ora, PTA, [facility] [address] [place], [place] [address] [phone] Apr 25, 2017, 1:10 PM   Name: Katie [name] MRN: [id number] Date of Birth: 07/14/1946  PHYSICAL THERAPY DISCHARGE SUMMARY  Visits from Start of Care: 4  Current functional level related to goals / functional outcomes:     PT Long Term Goals - 04/25/2017 0851      PT LONG TERM GOAL #1   Title Pt will be IND in HEP to improve balance, strength, and dizziness. TARGET DATE FOR ALL LTGS: April 25, 2017   Baseline 04/25/2017: met today with HEP advanced as needed   Status Achieved     PT LONG TERM GOAL #2   Title Pt will amb. 1000' over even/uneven terrain while performing head turns, IND, to improve functional mobility.    Baseline 2017/04/25: met today at mod I assist level   Status Achieved     PT LONG TERM GOAL #3   Title Pt will improve FGA score to >/=27/30 to decr. falls risk.    Baseline 04/25/2017: 27/30 scored today   Status Achieved     PT LONG TERM GOAL #4   Title Pt will report 0/10 dizziness during activities with eyes closed and during head turns to improve safety during mobility.    Baseline 04/25/17: met today per pt report.   Status Achieved        Remaining deficits: See above.   Education / Equipment: HEP   Plan: Patient agrees to discharge.  Patient goals were met. Patient is being discharged due to meeting the stated rehab goals.  ?????  G-code and d/c summary completed by PT. Geoffry Paradise, PT,DPT 04/07/17 12:06 PM Phone: [phone] Fax: [phone]

## 2017-04-06 ENCOUNTER — Other Ambulatory Visit: Payer: Self-pay | Admitting: *Deleted

## 2017-04-06 NOTE — Patient Outreach (Signed)
[facility]) Care Management  04/06/2017  Katie [name] [birth year] [id number]   This care manager assumed care of member after reassignment.  Member  scheduled for home visit with previously assigned care manager for tomorrow, however this care manager's schedule does not allow for visit time to be kept.  Call placed to member, identity confirmed.  This care manager introduced self and purpose of call.  Home visit rescheduled for next week.  Advised to contact with questions in the meantime.  [name], [place], MSN [name] [phone]

## 2017-04-07 ENCOUNTER — Ambulatory Visit: Payer: Self-pay

## 2017-04-11 ENCOUNTER — Ambulatory Visit: Payer: PPO

## 2017-04-12 ENCOUNTER — Other Ambulatory Visit (HOSPITAL_COMMUNITY): Payer: Self-pay | Admitting: Interventional Radiology

## 2017-04-12 DIAGNOSIS — I639 Cerebral infarction, unspecified: Secondary | ICD-10-CM

## 2017-04-14 ENCOUNTER — Other Ambulatory Visit: Payer: Self-pay | Admitting: *Deleted

## 2017-04-14 NOTE — Patient Outreach (Signed)
[facility]) Care Management   04/14/2017  Katie [name] 06-28-1946 [id number]  Katie [name] is an 71 y.o. female  Subjective:   Member alert and oriented x3, denies any pain or discomfort at this time.  She does state that she is short of breath when she wake up, but it resolves without intervention.  Report compliance with medications.  Objective:   Review of Systems  Constitutional: Negative.   HENT: Negative.   Eyes: Negative.   Respiratory: Positive for shortness of breath.        Intermittent  Cardiovascular: Positive for leg swelling.  Gastrointestinal: Negative.   Genitourinary: Negative.   Musculoskeletal: Negative.   Skin: Negative.   Neurological: Negative.   Endo/Heme/Allergies: Negative.   Psychiatric/Behavioral: Negative.     Physical Exam  Constitutional: She is oriented to person, place, and time. She appears well-developed and well-nourished.  Neck: Normal range of motion.  Cardiovascular: Normal rate, regular rhythm and normal heart sounds.   Respiratory: Effort normal and breath sounds normal.  GI: Soft. Bowel sounds are normal.  Musculoskeletal: Normal range of motion.  Neurological: She is alert and oriented to person, place, and time.  Skin: Skin is warm and dry.   BP 108/72 (BP Location: Left Arm, Patient Position: Sitting, Cuff Size: Normal)   Pulse 76   Resp 18   LMP 02/12/2013   SpO2 98%   Encounter Medications:   Outpatient Encounter Prescriptions as of 04/14/2017  Medication Sig  . acetaminophen (TYLENOL) 500 MG tablet Take 1,000 mg by mouth every 6 (six) hours as needed for headache (pain).  Marland Kitchen aspirin 325 MG tablet Take 1 tablet (325 mg total) by mouth daily.  . butalbital-acetaminophen-caffeine (FIORICET, ESGIC) 50-325-40 MG tablet Take 1 tablet by mouth every 12 (twelve) hours as needed for headache.  . clopidogrel (PLAVIX) 75 MG tablet Take 1 tablet (75 mg total) by mouth daily.  . divalproex (DEPAKOTE) 500 MG DR  tablet Take 1 tablet (500 mg total) by mouth at bedtime.  . feeding supplement, ENSURE ENLIVE, (ENSURE ENLIVE) LIQD Take 237 mLs by mouth 2 (two) times daily between meals.  . furosemide (LASIX) 20 MG tablet Take 20 mg by mouth daily.   . metoprolol tartrate (LOPRESSOR) 25 MG tablet Take 0.5 tablets (12.5 mg total) by mouth 2 (two) times daily.  . pantoprazole (PROTONIX) 40 MG tablet Take 1 tablet (40 mg total) by mouth daily.  . potassium chloride (MICRO-K) 10 MEQ CR capsule Take 2 capsules (20 mEq total) by mouth daily.  . pravastatin (PRAVACHOL) 80 MG tablet Take 80 mg by mouth at bedtime.   Marland Kitchen latanoprost (XALATAN) 0.005 % ophthalmic solution Place 1 drop into both eyes at bedtime.   No facility-administered encounter medications on file as of 04/14/2017.     Functional Status:   In your present state of health, do you have any difficulty performing the following activities: 03/03/2017 02/16/2017  Hearing? N -  Vision? Y -  Difficulty concentrating or making decisions? N -  Walking or climbing stairs? N -  Dressing or bathing? N -  Doing errands, shopping? N N  Preparing Food and eating ? N -  Using the Toilet? N -  In the past six months, have you accidently leaked urine? N -  Do you have problems with loss of bowel control? N -  Managing your Medications? N -  Managing your Finances? N -  Housekeeping or managing your Housekeeping? N -  Some recent data  might be hidden    Fall/Depression Screening:    Fall Risk  03/03/2017  Falls in the past year? Yes  Number falls in past yr: 1  Injury with Fall? No  Risk for fall due to : History of fall(s);Impaired balance/gait;Impaired mobility;Impaired vision;Medication side effect  Follow up Education provided;Falls prevention discussed;Follow up appointment   PHQ 2/9 Scores 03/03/2017 02/22/2017  PHQ - 2 Score 0 0    Assessment:    Met with member at scheduled time.  She report she has completed PT, denies any problems with  strength/mobility.  She is able to transport herself to/from appointments without problems, denies need for transportation.  She is concerned about appointment with cardiac surgeon, afraid she will have to have open heart surgery, not familiar with all that it entails.  She is made aware that [facility] will provide support regarding plan of care.  She expresses gratitude.    She report she check blood pressure and weights on a daily basis, provided with [facility] calendar tool book to include logs for recording data.  She denies any urgent concerns at this time, provided with contact information for this care manager.  Advised to contact with questions.  Plan:   Will follow up with member within the next 2 weeks to follow up on MD visits.  [facility] CM Care Plan Problem One     Most Recent Value  Care Plan Problem One  I just had a stroke  Role Documenting the Problem One  Care Management Coordinator  Care Plan for Problem One  Not Active  [facility] Long Term Goal   Patient will have no acute care admissions for stroke  [facility] Long Term Goal Start Date  03/03/17  [facility] Long Term Goal Met Date  04/14/17  Interventions for Problem One Long Term Goal  8/30 telephone call made to assess for her progress during meeting her case managment goals  [facility] CM Short Term Goal #2   patient will be in attendance to 100% of her medical appointments  [facility] CM Short Term Goal #2 Start Date  03/03/17  [facility] CM Short Term Goal #2 Met Date  03/31/17  Interventions for Short Term Goal #2  8/30 patient reports 100% attendance to medical appointments    [facility] CM Care Plan Problem Two     Most Recent Value  Care Plan Problem Two  Knowledge deficit regarding mitral regurgitation as evidenced by member's stated lack of knowledge  Role Documenting the Problem Two  Care Management Coordinator  Care Plan for Problem Two  Active  Interventions for Problem Two Long Term Goal   Educated member on complications of mitral regurgitation as well as possible  treatment options.  [place] Term Goal  Member will be able to state plan for mitral regurgitation treatment within the next 45 days  [facility] Long Term Goal Start Date  04/14/17  [facility] CM Short Term Goal #1   Member will keep and attend appointment with cardiac surgeon within the next 2 weeks  [facility] CM Short Term Goal #1 Start Date  04/14/17  Interventions for Short Term Goal #2   Member edcuated on the purpose and importance of keeping appoirntment with surgeon in effort to establish plan of care.  [facility] CM Short Term Goal #2   Member will report adherence to low sodium diet over the next 4 weeks  [facility] CM Short Term Goal #2 Start Date  04/14/17  Interventions for Short Term Goal #2  Member educated on  low sodium diet.  Advised of importance of following diet in effort to control fluid status.     [name], [place], MSN [place] [phone]

## 2017-04-15 ENCOUNTER — Other Ambulatory Visit: Payer: Self-pay | Admitting: Licensed Clinical Social Worker

## 2017-04-15 NOTE — Patient Outreach (Signed)
Request received from Brooke Joyce, LCSW to mail patient personal care resources.  Information mailed today. 

## 2017-04-15 NOTE — Patient Outreach (Signed)
[facility]) Care Management  04/15/2017  Katie [name] [birth year] [id number]  Assessment- CSW completed initial outreach call to patient after receiving new referral for Medicaid assistance and guidance. HTA has denied patient's last 2 hospitalization and now patient has [employer]. Patient answered phone call and provided HIPPA verifications. CSW introduced self, reason for call and of [facility] social work services. Patient reports that she is interested in applying for Medicaid. She shares that recently a caseworker at [facility] informed her that she would not be eligible for Medicaid due to her monthly income when she went to DSS to apply for Food Stamps. However, patient's increased hospital bills may allow her to be eligible. Patient shares that her monthly income is $1,189.00 per month. Patient reports that she as approved for food stamps and receives $15.00 per month. CSW provided education on how to apply for Medicaid (online, in person at [place] or paper application.) Patient reports having stable transportation (patient has a car and is able to drive herself) and is agreeable to go to DSS next week to apply for services. CSW informed her that it would be helpful to gather the following documents: Certified birth certificates or other proof of citizenship/alien statu, iIdentity documents, Proof of all sources of income (40 or tax return, [employer] Income, Veteran's benefits, retirement accounts, and any other income), Fish farm manager cards, Social Security numbers or proof that you have made an application for a number from the [employer], Documentation to verify that you live in [place], Copies of all pay stubs for last month, Copies of all medical or life [employer], Proof of insurance, Copies of medical bills, All asset information, List of all cars, trucks, motorcycles, boats, etc. that you or anyone in your household own, including the year,  make, model and vehicle identification number (VIN) for each, Most recent bank statements, List of all real property you own and Current financial statements/award letters from other sources of income, such as Fish farm manager, retirement benefits, pensions, veteran benefits and child support. Patient expressed understanding and is agreeable to CSW mailing her out "Tips and Tricks on how to apply for Medicaid" document to her residence for her to keep and review if needed. Patient reports feeling comfortable with applying for Medicaid on her own and already has several of the listed documents that she needs to take to DSS. CSW reviewed financial resources available in [place]. Patient reports being familiar with the available community resources. Patient denies having any other social work needs a this time and is very Patent attorney of phone call and social work assistance. CSW will now close case.  Plan-CSW will update [facility] RNCM. CSW will send request to [place] Management Assistant to mail out financial resources and tips and trick on how to apply for Medicaid.   Eula Fried, BSW, MSW, [email or web address] Phone: [phone] Fax: [phone]

## 2017-04-18 ENCOUNTER — Ambulatory Visit: Payer: PPO | Admitting: Physical Therapy

## 2017-04-19 ENCOUNTER — Ambulatory Visit (INDEPENDENT_AMBULATORY_CARE_PROVIDER_SITE_OTHER): Payer: Medicare Other | Admitting: *Deleted

## 2017-04-19 DIAGNOSIS — I639 Cerebral infarction, unspecified: Secondary | ICD-10-CM | POA: Diagnosis not present

## 2017-04-20 NOTE — Progress Notes (Signed)
Carelink Summary Report / Loop Recorder 

## 2017-04-21 LAB — CUP PACEART REMOTE DEVICE CHECK
Date Time Interrogation Session: 20180918184202
Implantable Pulse Generator Implant Date: 20180720

## 2017-04-22 ENCOUNTER — Ambulatory Visit (HOSPITAL_COMMUNITY)
Admission: RE | Admit: 2017-04-22 | Discharge: 2017-04-22 | Disposition: A | Discharge status: Home / Self Care | Payer: Medicare Other | Source: Ambulatory Visit | Attending: Interventional Radiology | Admitting: Interventional Radiology

## 2017-04-22 ENCOUNTER — Encounter (HOSPITAL_COMMUNITY): Payer: Self-pay

## 2017-04-22 ENCOUNTER — Ambulatory Visit (HOSPITAL_COMMUNITY): Admission: RE | Admit: 2017-04-22 | Payer: Medicare Other | Source: Ambulatory Visit

## 2017-04-22 DIAGNOSIS — I639 Cerebral infarction, unspecified: Secondary | ICD-10-CM | POA: Diagnosis not present

## 2017-04-22 LAB — POCT I-STAT CREATININE: Creatinine, Ser: 1.7 mg/dL — ABNORMAL HIGH (ref 0.44–1.00)

## 2017-04-22 MED ORDER — GADOBENATE DIMEGLUMINE 529 MG/ML IV SOLN
8.0000 mL | Freq: Once | INTRAVENOUS | Status: AC | PRN
  Administered 2017-04-22: 8 mL via INTRAVENOUS

## 2017-04-25 ENCOUNTER — Encounter: Payer: Self-pay | Admitting: Neurology

## 2017-04-25 ENCOUNTER — Encounter: Payer: Medicare Other | Admitting: Thoracic Surgery (Cardiothoracic Vascular Surgery)

## 2017-04-25 ENCOUNTER — Other Ambulatory Visit: Payer: Self-pay

## 2017-04-25 ENCOUNTER — Ambulatory Visit (INDEPENDENT_AMBULATORY_CARE_PROVIDER_SITE_OTHER): Payer: Medicare Other | Admitting: Neurology

## 2017-04-25 VITALS — BP 115/83 | HR 78 | Ht 63.0 in | Wt 169.8 lb

## 2017-04-25 DIAGNOSIS — I639 Cerebral infarction, unspecified: Secondary | ICD-10-CM | POA: Diagnosis not present

## 2017-04-25 MED ORDER — DIVALPROEX SODIUM 500 MG PO DR TAB: 500.0000 mg | DELAYED_RELEASE_TABLET | Freq: Every day | ORAL | 2 refills | Status: DC

## 2017-04-25 NOTE — Progress Notes (Signed)
[facility] [address] [place]. [place] [address] [phone]       OFFICE FOLLOW-UP NOTE  Katie. Katie [name] Date of Birth:  [birth year] Medical Record Number:  [id number]   HPI: Katie [name] is a 30 year african american lady seen today for first office f/u visit after [facility] admission for stroke in July 2018.Katie [name] is an 71 y.o. female with history of CHF, renal cancer s/p R nephrectomy 2007, stage III CKD, HTN, HLD, and just discharged on 02/12/17 for stroke who was admitted again for CP. She was found to have acute on chronic heart failure and was treated for such. However, pt continues to complain of HA. Neurology was consulted for HA management.Pt was admitted from 02/10/17 to 02/12/17 for HA. Stroke workup found acute infarct affecting the superior cerebellum on the left. 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 TTE showed no cardiac source of emboli however moderate pulmonary hypertension was found. LDL 88, A1c 5.6. She was put on dual antiplatelet for 3 months and then Plavix alone. She was also continued on pravastatin 80 mg daily. She had outpatient transesophageal echocardiogram which was negative for cardiac source of embolism and had loop recorder placed and so for atrial fibrillation has not yet been found.. During the admission, she continued complaining of headache, but gradually getting better at discharge, although not resolved. Since discharge, she continued to have intermittent headache. She states the headaches and now much less in about once a week or so. She is complaining of some swelling in the legs and weight gain which may be related to Depakote. She denies any focal neurological symptoms. She states her blood pressure is well controlled and today it is 115/83. She is starting protocol well without muscle aches and pains. She remains on aspirin and Plavix which is tolerating well but  denies significant bruising or bleeding. She has an upcoming appointment to see Dr. Ricard Dillon to discuss heart valve surgery. She had follow-up MRI scan of the brain done on 04/22/17 which I personally reviewed shows old left cerebellar and left parietal infarcts without acute finding. MRA of the brain shows complete recanalization of the previously occluded distal basilar artery indicating that the previous occlusion with embolic and not intrinsic atherosclerosis. ROS:   14 system review of systems is positive for  eye itching, eye pain, cough, shortness of breath, leg swelling, headache and all other systems negative  PMH:  Past Medical History:  Diagnosis Date  . Chronic combined systolic and diastolic CHF (congestive heart failure) ([place])   . 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 Headache   . History of kidney cancer    a. s/p right nephrectomy ~2007.  Marland Kitchen History of loop recorder   . Hypercholesterolemia   . Hypertension   . Mitral regurgitation   . Pulmonary hypertension ([place])   . Stroke [facility])     Social History:  Social History   Social History  . Marital status: Divorced    Spouse name: N/A  . Number of children: N/A  . Years of education: N/A   Occupational History  . Not on file.   Social History Main Topics  . Smoking status: Never Smoker  . Smokeless tobacco: Never Used  . Alcohol use No  . Drug use: No  . Sexual activity: Not on file   Other Topics Concern  .  Not on file   Social History Narrative  . No narrative on file    Medications:   Current Outpatient Prescriptions on File Prior to Visit  Medication Sig Dispense Refill  . acetaminophen (TYLENOL) 500 MG tablet Take 1,000 mg by mouth every 6 (six) hours as needed for headache (pain).    Marland Kitchen aspirin 325 MG tablet Take 1 tablet (325 mg total) by mouth daily.    . butalbital-acetaminophen-caffeine (FIORICET, ESGIC) 50-325-40 MG tablet Take 1 tablet by mouth every  12 (twelve) hours as needed for headache. 14 tablet 0  . clopidogrel (PLAVIX) 75 MG tablet Take 1 tablet (75 mg total) by mouth daily. 30 tablet 1  . feeding supplement, ENSURE ENLIVE, (ENSURE ENLIVE) LIQD Take 237 mLs by mouth 2 (two) times daily between meals. 237 mL 0  . furosemide (LASIX) 20 MG tablet Take 20 mg by mouth daily.   0  . latanoprost (XALATAN) 0.005 % ophthalmic solution Place 1 drop into both eyes at bedtime.  0  . metoprolol tartrate (LOPRESSOR) 25 MG tablet Take 0.5 tablets (12.5 mg total) by mouth 2 (two) times daily. 30 tablet 8  . pantoprazole (PROTONIX) 40 MG tablet Take 1 tablet (40 mg total) by mouth daily. 30 tablet 0  . potassium chloride (MICRO-K) 10 MEQ CR capsule Take 2 capsules (20 mEq total) by mouth daily. 60 capsule 11  . pravastatin (PRAVACHOL) 80 MG tablet Take 80 mg by mouth at bedtime.      No current facility-administered medications on file prior to visit.     Allergies:   Allergies  Allergen Reactions  . Tramadol Shortness Of Breath, Palpitations and Other (See Comments)    Physical Exam General: Pleasant frail elderly African-American lady, seated, in no evident distress Head: head normocephalic and atraumatic.  Neck: supple with no carotid or supraclavicular bruits Cardiovascular: regular rate and rhythm, no murmurs Musculoskeletal: no deformity Skin:  no rash/petichiae Vascular:  Normal pulses all extremities Vitals:   04/25/17 0833  BP: 115/83  Pulse: 78   Neurologic Exam Mental Status: Awake and fully alert. Oriented to place and time. Recent and remote memory intact. Attention span, concentration and fund of knowledge appropriate. Mood and affect appropriate.  Cranial Nerves: Fundoscopic exam reveals sharp disc margins. Pupils equal, briskly reactive to light. Extraocular movements full without nystagmus. Visual fields full to confrontation. Hearing intact. Facial sensation intact. Face, tongue, palate moves normally and symmetrically.   Motor: Normal bulk and tone. Normal strength in all tested extremity muscles. Sensory.: intact to touch ,pinprick .position and vibratory sensation.  Coordination: Rapid alternating movements normal in all extremities. Finger-to-nose and heel-to-shin performed accurately bilaterally. Gait and Station: Arises from chair without difficulty. Stance is normal. Gait demonstrates normal stride length and balance . Able to heel, toe and tandem walk without difficulty.  Reflexes: 1+ and symmetric. Toes downgoing.   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 Remote silent left parietal MCA branch infarct as well. Vascular risk factors of hypertension hyperlipidemia.CHF. Post stroke headaches seem to have improved.    PLAN: I had a long d/w patient about her recent cryptogenic stroke, risk for recurrent stroke/TIAs, personally independently reviewed imaging studies and stroke evaluation results and answered questions.Continue aspirin 325 mg daily and Plavix 75 mg daily  for secondary stroke prevention and maintain strict control of hypertension with blood pressure goal below 130/90, diabetes with hemoglobin  A1c goal below 6.5% and lipids with LDL cholesterol goal below 70 mg/dL. I also advised the patient to eat a healthy diet with plenty of whole grains, cereals, fruits and vegetables, exercise regularly and maintain ideal body weight . I recommend we discontinued Depakote since she is having some leg swelling and weight gain and a headache frequency is only once a week. The patient is considering having open heart surgery and I advised her that she may have to hold her antiplatelet therapy in the perioperative period with a small but acceptable risk of TIA/stroke. She has an upcoming appointment to discuss this with Dr.Owens soon Followup in the future with my nurse practitioner in 3 months or call earlier  if necessary Greater than 50% of time during this 25 minute visit was spent on counseling,explanation of diagnosis, planning of further management, discussion with patient and family and coordination of care Antony Contras, MD  [facility] [address] [place], [place] [address]  Phone [phone] Fax [phone] Note: This document was prepared with digital dictation and possible smart phrase technology. Any transcriptional errors that result from this process are unintentional

## 2017-04-25 NOTE — Patient Instructions (Signed)
I had a long d/w patient about her recent cryptogenic stroke, risk for recurrent stroke/TIAs, personally independently reviewed imaging studies and stroke evaluation results and answered questions.Continue aspirin 325 mg daily and Plavix 75 mg daily  for secondary stroke prevention and maintain strict control of hypertension with blood pressure goal below 130/90, diabetes with hemoglobin A1c goal below 6.5% and lipids with LDL cholesterol goal below 70 mg/dL. I also advised the patient to eat a healthy diet with plenty of whole grains, cereals, fruits and vegetables, exercise regularly and maintain ideal body weight . I recommend we discontinued Depakote since she is having some leg swelling and weight gain and a headache frequency is only once a week. The patient is considering having open heart surgery and I advised her that she may have to hold her antiplatelet therapy in the perioperative period with a small but acceptable risk of TIA/stroke. She has an upcoming appointment to discuss this with Dr.Owens soon Followup in the future with my nurse practitioner in 3 months or call earlier if necessary  Stroke Prevention Some medical conditions and behaviors are associated with an increased chance of having a stroke. You may prevent a stroke by making healthy choices and managing medical conditions. How can I reduce my risk of having a stroke?  Stay physically active. Get at least 30 minutes of activity on most or all days.  Do not smoke. It may also be helpful to avoid exposure to secondhand smoke.  Limit alcohol use. Moderate alcohol use is considered to be: ? No more than 2 drinks per day for men. ? No more than 1 drink per day for nonpregnant women.  Eat healthy foods. This involves: ? Eating 5 or more servings of fruits and vegetables a day. ? Making dietary changes that address high blood pressure (hypertension), high cholesterol, diabetes, or obesity.  Manage your cholesterol  levels. ? Making food choices that are high in fiber and low in saturated fat, trans fat, and cholesterol may control cholesterol levels. ? Take any prescribed medicines to control cholesterol as directed by your health care provider.  Manage your diabetes. ? Controlling your carbohydrate and sugar intake is recommended to manage diabetes. ? Take any prescribed medicines to control diabetes as directed by your health care provider.  Control your hypertension. ? Making food choices that are low in salt (sodium), saturated fat, trans fat, and cholesterol is recommended to manage hypertension. ? Ask your health care provider if you need treatment to lower your blood pressure. Take any prescribed medicines to control hypertension as directed by your health care provider. ? If you are 52-58 years of age, have your blood pressure checked every 3-5 years. If you are 35 years of age or older, have your blood pressure checked every year.  Maintain a healthy weight. ? Reducing calorie intake and making food choices that are low in sodium, saturated fat, trans fat, and cholesterol are recommended to manage weight.  Stop drug abuse.  Avoid taking birth control pills. ? Talk to your health care provider about the risks of taking birth control pills if you are over 25 years old, smoke, get migraines, or have ever had a blood clot.  Get evaluated for sleep disorders (sleep apnea). ? Talk to your health care provider about getting a sleep evaluation if you snore a lot or have excessive sleepiness.  Take medicines only as directed by your health care provider. ? For some people, aspirin or blood thinners (anticoagulants) are helpful in  reducing the risk of forming abnormal blood clots that can lead to stroke. If you have the irregular heart rhythm of atrial fibrillation, you should be on a blood thinner unless there is a good reason you cannot take them. ? Understand all your medicine instructions.  Make  sure that other conditions (such as anemia or atherosclerosis) are addressed. Get help right away if:  You have sudden weakness or numbness of the face, arm, or leg, especially on one side of the body.  Your face or eyelid droops to one side.  You have sudden confusion.  You have trouble speaking (aphasia) or understanding.  You have sudden trouble seeing in one or both eyes.  You have sudden trouble walking.  You have dizziness.  You have a loss of balance or coordination.  You have a sudden, severe headache with no known cause.  You have new chest pain or an irregular heartbeat. Any of these symptoms may represent a serious problem that is an emergency. Do not wait to see if the symptoms will go away. Get medical help at once. Call your local emergency services (911 in [place] Do not drive yourself to the hospital. This information is not intended to replace advice given to you by your health care provider. Make sure you discuss any questions you have with your health care provider. Document Released: 08/26/2004 Document Revised: 12/25/2015 Document Reviewed: 01/19/2013 Elsevier Interactive Patient Education  2017 [employer].

## 2017-04-27 ENCOUNTER — Institutional Professional Consult (permissible substitution) (INDEPENDENT_AMBULATORY_CARE_PROVIDER_SITE_OTHER): Payer: Medicare Other | Admitting: Thoracic Surgery (Cardiothoracic Vascular Surgery)

## 2017-04-27 ENCOUNTER — Encounter: Payer: Self-pay | Admitting: Thoracic Surgery (Cardiothoracic Vascular Surgery)

## 2017-04-27 VITALS — BP 121/83 | HR 86 | Ht 63.0 in | Wt 170.0 lb

## 2017-04-27 DIAGNOSIS — I340 Nonrheumatic mitral (valve) insufficiency: Secondary | ICD-10-CM | POA: Diagnosis not present

## 2017-04-27 DIAGNOSIS — I272 Pulmonary hypertension, unspecified: Secondary | ICD-10-CM

## 2017-04-27 DIAGNOSIS — I071 Rheumatic tricuspid insufficiency: Secondary | ICD-10-CM | POA: Diagnosis not present

## 2017-04-27 NOTE — Progress Notes (Addendum)
[address]       [place] [address]             [place] REPORT  Referring Provider is Katie [name], [place]  Primary Cardiologist is Katie Moores, MD PCP is Katie Carol, MD  Chief Complaint  Patient presents with  . Mitral Regurgitation    MODERATE per TEE 02/18/17, last CATH 01/12/16, LEFT STROKE with tPA 02/14/17    HPI:  Patient is a 71 year old African-American female with history of chronic combined systolic and diastolic congestive heart failure, mitral regurgitation, stage III chronic kidney disease with h/o nephrectomy in the past due to renal cell CA, hypertension, pulmonary hypertension, recent stroke, and hyperlipidemia who was referred for surgical consultation to discuss treatment options for management of mitral regurgitation. The patient was hospitalized in 2017 with acute exacerbation of chronic congestive heart failure. Echocardiogram performed at that time revealed normal left ventricular systolic function with grade 3 diastolic dysfunction and moderate mitral regurgitation. Left and right heart catheterization revealed normal coronary arteries with moderate global left ventricular systolic dysfunction and ejection fraction estimated 35-40% pulmonary artery pressures were only mildly elevated and there was no significant mitral regurgitation. Pulmonary capillary wedge pressure was relatively low.  Medical therapy was recommended. The patient was hospitalized in July of this year with a headache and found to have suffered an acute stroke. 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  Transthoracic and transesophageal echocardiogram revealed no source for embolization. Both revealed mild left ventricular systolic  dysfunction with ejection fraction estimated 55-60% on transthoracic echocardiogram, and 45-50% on TEE. TEE performed 02/18/2017 was reported to demonstrate "moderate to severe mitral regurgitation" although no attempts to quantify the mitral regurgitation were performed and there was no flow reversal in the pulmonary veins.  A loop recorder was placed and today has revealed no evidence for underlying atrial dysrhythmias. The patient was recently seen in follow-up by Katie [name] at [facility] and referred for elective surgical consultation.  More recently the patient was seen in follow-up by Dr. Leonie [name] on 04/25/2017. 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 There was complete recanalization of the previously occluded distal basilar artery, suggesting that the previous occlusion was embolic and not secondary to intrinsic atherosclerosis.  The patient is single and lives locally in [place]. She previously worked as a Biomedical scientist at [employer].  She has 5 adult children, one daughter and son live nearby and supportive.  The patient states that she lives a fairly sedentary lifestyle. The patient's primary complaint is that of chronic fatigue. She denies much shortness of breath but she notes that she tires easily. She has chronic lower extremity edema which waxes and wanes to some degree in severity. She reports her weight at home and takes Lasix 20 mg on a daily basis.  She describes some occasional symptoms of orthopnea and exertional shortness of breath. She denies any history of chest pain or chest tightness. She has not had palpitations, dizzy spells, nor syncope.    Past Medical History:  Diagnosis Date  . Chronic combined systolic and diastolic CHF (congestive heart failure) ([place])   . 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  Headache   . History of kidney cancer    a. s/p  right nephrectomy ~2007.  Marland Kitchen History of loop recorder   . Hypercholesterolemia   . Hypertension   . Mitral regurgitation   . Pulmonary hypertension ([place])   . Stroke [facility])     Past Surgical History:  Procedure Laterality Date  . CARDIAC CATHETERIZATION N/A 01/12/2016   Procedure: Right/Left Heart Cath and Coronary/Graft Angiography;  Surgeon: Belva Crome, MD;  Location: [facility];  Service: Cardiovascular;  Laterality: N/A;  . CERVICAL POLYPECTOMY N/A 04/23/2013   Procedure: CERVICAL POLYPECTOMY;  Surgeon: Maeola Sarah. Landry Mellow, MD;  Location: [facility];  Service: Gynecology;  Laterality: N/A;  Possible Polypectomy  . HYSTEROSCOPY W/D&C N/A 04/23/2013   Procedure: DILATATION AND CURETTAGE /HYSTEROSCOPY;  Surgeon: Maeola Sarah. Landry Mellow, MD;  Location: [facility];  Service: Gynecology;  Laterality: N/A;  . IR ANGIO INTRA EXTRACRAN SEL COM CAROTID INNOMINATE BILAT MOD SED  02/11/2017  . IR ANGIO VERTEBRAL SEL VERTEBRAL BILAT MOD SED  02/11/2017  . KNEE ARTHROSCOPY Left   . LOOP RECORDER INSERTION N/A 02/18/2017   Procedure: Loop Recorder Insertion;  Surgeon: Constance Haw, MD;  Location: [facility];  Service: Cardiovascular;  Laterality: N/A;  . NEPHRECTOMY Right    due to carcinoma  . TEE WITHOUT CARDIOVERSION N/A 02/18/2017   Procedure: TRANSESOPHAGEAL ECHOCARDIOGRAM (TEE) WITH LOOP;  Surgeon: Acie Fredrickson Wonda Cheng, MD;  Location: [facility];  Service: Cardiovascular;  Laterality: N/A;    Family History  Problem Relation Age of Onset  . Stroke Brother   . Diabetes Mellitus II Sister     Social History   Social History  . Marital status: Divorced    Spouse name: N/A  . Number of children: N/A  . Years of education: N/A   Occupational History  . Not on file.   Social History Main Topics  . Smoking status: Never Smoker  . Smokeless tobacco: Never Used  . Alcohol use No  . Drug use: No  . Sexual activity: Not on file   Other Topics Concern  . Not on file   Social History  Narrative  . No narrative on file    Current Outpatient Prescriptions  Medication Sig Dispense Refill  . acetaminophen (TYLENOL) 500 MG tablet Take 1,000 mg by mouth every 6 (six) hours as needed for headache (pain).    Marland Kitchen aspirin 325 MG tablet Take 1 tablet (325 mg total) by mouth daily.    . butalbital-acetaminophen-caffeine (FIORICET, ESGIC) 50-325-40 MG tablet Take 1 tablet by mouth every 12 (twelve) hours as needed for headache. 14 tablet 0  . clopidogrel (PLAVIX) 75 MG tablet Take 1 tablet (75 mg total) by mouth daily. 30 tablet 1  . feeding supplement, ENSURE ENLIVE, (ENSURE ENLIVE) LIQD Take 237 mLs by mouth 2 (two) times daily between meals. 237 mL 0  . furosemide (LASIX) 20 MG tablet Take 20 mg by mouth daily.   0  . latanoprost (XALATAN) 0.005 % ophthalmic solution Place 1 drop into both eyes at bedtime.  0  . levothyroxine (SYNTHROID, LEVOTHROID) 25 MCG tablet Take 25 mcg by mouth daily before breakfast.    . metoprolol tartrate (LOPRESSOR) 25 MG tablet Take 0.5 tablets (12.5 mg total) by mouth 2 (two) times daily. 30 tablet 8  . pantoprazole (PROTONIX) 40 MG tablet Take 1 tablet (40 mg total) by mouth daily. 30 tablet 0  . potassium chloride (MICRO-K) 10 MEQ CR capsule Take 2 capsules (20  mEq total) by mouth daily. 60 capsule 11  . pravastatin (PRAVACHOL) 80 MG tablet Take 80 mg by mouth at bedtime.      No current facility-administered medications for this visit.     Allergies  Allergen Reactions  . Tramadol Shortness Of Breath, Palpitations and Other (See Comments)      Review of Systems:   General:  normal appetite, decreased energy, no weight gain, no weight loss, no fever  Cardiac:  no chest pain with exertion, no chest pain at rest, +SOB with exertion, no resting SOB, no PND, + orthopnea, no palpitations, no arrhythmia, no atrial fibrillation, + LE edema, no dizzy spells, no syncope  Respiratory:  occasional shortness of breath, no home oxygen, no productive cough,  no dry cough, no bronchitis, no wheezing, no hemoptysis, no asthma, no pain with inspiration or cough, no sleep apnea, no CPAP at night  GI:   no difficulty swallowing, no reflux, no frequent heartburn, no hiatal hernia, no abdominal pain, no constipation, no diarrhea, no hematochezia, no hematemesis, no melena  GU:   no dysuria,  no frequency, no urinary tract infection, no hematuria, no kidney stones, + kidney disease  Vascular:  no pain suggestive of claudication, no pain in feet, no leg cramps, no varicose veins, no DVT, no non-healing foot ulcer  Neuro:   + stroke, no TIA's, no seizures, + headaches, no temporary blindness one eye,  no slurred speech, no peripheral neuropathy, no chronic pain, no instability of gait, no memory/cognitive dysfunction  Musculoskeletal: + arthritis, no joint swelling, no myalgias, no difficulty walking, normal mobility   Skin:   no rash, no itching, no skin infections, no pressure sores or ulcerations  Psych:   no anxiety, no depression, no nervousness, no unusual recent stress  Eyes:   no blurry vision, no floaters, no recent vision changes, does not wear glasses or contacts  ENT:   no hearing loss, no loose or painful teeth, + dentures  Hematologic:  no easy bruising, no abnormal bleeding, no clotting disorder, no frequent epistaxis  Endocrine:  no diabetes, does not check CBG's at home     Physical Exam:   BP 121/83 (BP Location: Left Arm, Patient Position: Sitting, Cuff Size: Large)   Pulse 86   Ht 5\' 3"  (1.6 m)   Wt 170 lb (77.1 kg)   LMP 02/12/2013   BMI 30.11 kg/m   General:  Moderately obese female NAD   HEENT:  Unremarkable   Neck:   no JVD, no bruits, no adenopathy   Chest:   clear to auscultation, symmetrical breath sounds, no wheezes, no rhonchi   CV:   RRR, grade III/VI holosystolic murmur best at apex  Abdomen:  soft, non-tender, no masses   Extremities:  warm, well-perfused, pulses diminished but palpable, mild bilateral LE  edema  Rectal/GU  Deferred  Neuro:   Grossly non-focal and symmetrical throughout  Skin:   Clean and dry, no rashes, no breakdown   Diagnostic Tests:  Right/Left Heart Cath and Coronary/Graft Angiography  Conclusion    Widely patent/normal coronary arteries.  Mildly to moderately reduced left ventricular systolic function with EF 35-40%. Filling pressures are normal. No mitral regurgitation of significance is noted.  Mildly elevated pulmonary artery pressures with mean PA pressure of 23 mmHg.   RECOMMENDATIONS:   Heart failure therapy to be continued by the primary team.  Overall prognosis for improvement is optimistic.  Indications   Acute combined systolic and diastolic heart failure ([place]) [I50.41 (  ICD-10-CM)]  Mitral regurgitation [I34.0 (QBH-41-PF)]  Complications   Complications documented in old activity   The right femoral area was sterilely prepped and draped. 1% Xylocaine local infiltration was then given for analgesia. Sedation was administered with the combination of Versed and fentanyl.. A Seldinger needle was then used to perform an anterior wall femoral artery stick. A 5 French sheath was inserted using the modified Seldinger technique. The Seldinger needle was then used to perform an anterior and posterior wall stick on the right femoral vein. A 7 French sheath was inserted using the modified Seldinger technique. Right heart catheterization was performed with a 7 French Swan-Ganz catheter. A left main pulmonary artery oximetry samples was obtained. Pressures were recorded and the right atrium, right ventricle, pulmonary artery, and wedge positions. A pullback was performed through the right heart.   Coronary angiography was then performed using a 5 French A2 MP and JR4. Left ventriculography was performed using an angled pigtail. The left ventricle was opacified by power injection at 15 cc/s for a total of 45 cc..   During this procedure the patient was  administered a total of Versed 2 mg and Fentanyl 100 mg to achieve and maintain moderate conscious sedation. The patient's heart rate, blood pressure, and oxygen saturation are monitored continuously during the procedure. The period of conscious sedation is 20 minutes, of which I was present face-to-face 100% of this time.  Estimated blood loss <50 mL. There were no immediate complications during the procedure.      Coronary Findings   Dominance: Right  Ramus Intermedius  . Vessel is small.  Left Circumflex  First Obtuse Marginal Branch  The vessel is large in size.  Second Obtuse Marginal Branch  The vessel is small in size.  Right Heart   Right Heart Pressures Hemodynamic findings consistent with mild pulmonary hypertension. LV EDP is normal.    Wall Motion              Left Heart   Left Ventricle The left ventricular ejection fraction is 35-45% by visual estimate.    Coronary Diagrams   Diagnostic Diagram       Implants     No implant documentation for this case.  PACS Images   Show images for Cardiac catheterization   Link to Procedure Log   Procedure Log    Hemo Data    Most Recent Value  Fick Cardiac Output 4.53 L/min  Fick Cardiac Output Index 2.45 (L/min)/BSA  RA A Wave 4 mmHg  RA V Wave 4 mmHg  RA Mean 4 mmHg  RV Systolic Pressure 38 mmHg  RV Diastolic Pressure 6 mmHg  RV EDP 8 mmHg  PA Systolic Pressure 38 mmHg  PA Diastolic Pressure 13 mmHg  PA Mean 23 mmHg  PW A Wave 15 mmHg  PW V Wave 15 mmHg  PW Mean 14 mmHg  AO Systolic Pressure 790 mmHg  AO Diastolic Pressure 65 mmHg  AO Mean 83 mmHg  LV Systolic Pressure 240 mmHg  LV Diastolic Pressure 7 mmHg  LV EDP 12 mmHg  Arterial Occlusion Pressure Extended Systolic Pressure 973 mmHg  Arterial Occlusion Pressure Extended Diastolic Pressure 65 mmHg  Arterial Occlusion Pressure Extended Mean Pressure 85 mmHg  Left Ventricular Apex Extended Systolic Pressure 532 mmHg  Left Ventricular Apex  Extended Diastolic Pressure 6 mmHg  Left Ventricular Apex Extended EDP Pressure 14 mmHg  QP/QS 1  TPVR Index 9.4 HRUI  TSVR Index 33.91 HRUI  PVR SVR Ratio 0.11  TPVR/TSVR Ratio 0.28      Transthoracic Echocardiography  (Report amended )  Patient:    [place], Katie [name] MR #:       [id number] Study Date: 02/12/2017 Gender:     F Age:        37 Height:     157.5 cm Weight:     74.1 kg BSA:        1.83 m^2 Pt. Status: Room:       [place]  PERFORMING   [facility], Inpatient  ADMITTING    Elwin Mocha  SONOGRAPHER  Lonny Prude [place]  cc:  ------------------------------------------------------------------- LV EF: 55% -   60%  ------------------------------------------------------------------- History:   PMH:  Stroke.  Risk factors:  Hypertension. Dyslipidemia.  ------------------------------------------------------------------- Study Conclusions  - Left ventricle: The cavity size was at the upper limits of   normal. There was mild concentric hypertrophy. Systolic function   was normal. The estimated ejection fraction was in the range of   55% to 60%. Wall motion was normal; there were no regional wall   motion abnormalities. There was a reduced contribution of atrial   contraction to ventricular filling, due to increased ventricular   diastolic pressure or atrial contractile dysfunction. Doppler   parameters are consistent with a reversible restrictive pattern,   indicative of decreased left ventricular diastolic compliance   and/or increased left atrial pressure (grade 3 diastolic   dysfunction). Doppler parameters are consistent with high   ventricular filling pressure. - Aortic valve: Trileaflet; mildly thickened, mildly calcified   leaflets. - Mitral valve: Mild diffuse thickening of the anterior leaflet.   Mild, late systolicprolapse, involving the anterior leaflet.    There was moderate regurgitation. - Left atrium: The atrium was moderately dilated. - Right atrium: The atrium was mildly dilated. - Tricuspid valve: There was moderate regurgitation. - Pulmonic valve: There was mild regurgitation. - Pulmonary arteries: PA peak pressure: 61 mm Hg (S).  Impressions:  - The right ventricular systolic pressure was increased consistent   with moderate pulmonary hypertension.  ------------------------------------------------------------------- Study data:  The previous study was not available, so comparison was made to the report of 01/08/2016.  Study status:  Routine. Procedure:  Transthoracic echocardiography. Image quality was adequate.  Study completion:  There were no complications. Transthoracic echocardiography.  M-mode, complete 2D, spectral Doppler, and color Doppler.  Birthdate:  Patient birthdate: [birth year].  Age:  Patient is 71 yr old.  Sex:  Gender: female. BMI: 29.9 kg/m^2.  Blood pressure:     120/73  Patient status: Inpatient.  Study date:  Study date: 02/12/2017. Study time: 03:59 PM.  Location:  Bedside.  -------------------------------------------------------------------  ------------------------------------------------------------------- Left ventricle:  The cavity size was at the upper limits of normal. There was mild concentric hypertrophy. Systolic function was normal. The estimated ejection fraction was in the range of 55% to 60%. Wall motion was normal; there were no regional wall motion abnormalities. There was a reduced contribution of atrial contraction to ventricular filling, due to increased ventricular diastolic pressure or atrial contractile dysfunction. Doppler parameters are consistent with a reversible restrictive pattern, indicative of decreased left ventricular diastolic compliance and/or increased left atrial pressure (grade 3 diastolic dysfunction). Doppler parameters are consistent with high ventricular  filling pressure.  ------------------------------------------------------------------- Aortic valve:   Trileaflet; mildly thickened, mildly calcified leaflets. Mobility was not restricted.  Doppler:  Transvalvular velocity was within the normal range. There was no stenosis. There was no regurgitation.  ------------------------------------------------------------------- Aorta:  Aortic root: The aortic root was normal in size.  ------------------------------------------------------------------- Mitral valve:   Mild diffuse thickening of the anterior leaflet. Mobility was not restricted.  Mild, late systolicprolapse, involving the anterior leaflet.  Doppler:  Transvalvular velocity was within the normal range. There was no evidence for stenosis. There was moderate regurgitation.    Peak gradient (D): 5 mm Hg.   ------------------------------------------------------------------- Left atrium:  The atrium was moderately dilated.  ------------------------------------------------------------------- Right ventricle:  The cavity size was normal. Wall thickness was normal. Systolic function was normal.  ------------------------------------------------------------------- Pulmonic valve:    Structurally normal valve.   Cusp separation was normal.  Doppler:  Transvalvular velocity was within the normal range. There was no evidence for stenosis. There was mild regurgitation.  ------------------------------------------------------------------- Tricuspid valve:   Structurally normal valve.    Doppler: Transvalvular velocity was within the normal range. There was moderate regurgitation.  ------------------------------------------------------------------- Pulmonary artery:   The main pulmonary artery was normal-sized. Systolic pressure was within the normal range.  ------------------------------------------------------------------- Right atrium:  The atrium was mildly  dilated.  ------------------------------------------------------------------- Pericardium:  There was no pericardial effusion.  ------------------------------------------------------------------- Systemic veins: Inferior vena cava: The vessel was normal in size.  ------------------------------------------------------------------- Measurements   Left ventricle                             Value        Reference  LV ID, ED, PLAX chordal                    50    mm     43 - 52  LV ID, ES, PLAX chordal                    37    mm     23 - 38  LV fx shortening, PLAX chordal     (L)     26    %      >=29  LV PW thickness, ED                        12    mm     ----------  IVS/LV PW ratio, ED                        1            <=1.3  Stroke volume, 2D                          23    ml     ----------  Stroke volume/bsa, 2D                      13    ml/m^2 ----------  LV ejection fraction, 1-p A4C              57    %      ----------  LV end-diastolic volume, 2-p               57    ml     ----------  LV end-systolic volume, 2-p                23    ml     ----------  LV ejection fraction, 2-p                  59    %      ----------  Stroke volume, 2-p                         34    ml     ----------  LV end-diastolic volume/bsa, 2-p           31    ml/m^2 ----------  LV end-systolic volume/bsa, 2-p            13    ml/m^2 ----------  Stroke volume/bsa, 2-p                     18.6  ml/m^2 ----------  LV e&', lateral                             5.77  cm/s   ----------  LV E/e&', lateral                           19.06        ----------  LV e&', medial                              5.33  cm/s   ----------  LV E/e&', medial                            20.64        ----------  LV e&', average                             5.55  cm/s   ----------  LV E/e&', average                           19.82        ----------    Ventricular septum                         Value        Reference  IVS  thickness, ED                          12    mm     ----------    LVOT                                       Value        Reference  LVOT ID, S                                 16    mm     ----------  LVOT area                                  2.01  cm^2   ----------  LVOT peak velocity, S  77.3  cm/s   ----------  LVOT mean velocity, S                      48    cm/s   ----------  LVOT VTI, S                                11.5  cm     ----------  LVOT peak gradient, S                      2     mm Hg  ----------    Aorta                                      Value        Reference  Aortic root ID, ED                         25    mm     ----------    Left atrium                                Value        Reference  LA ID, A-P, ES                             48    mm     ----------  LA ID/bsa, A-P                     (H)     2.63  cm/m^2 <=2.2  LA volume, S                               57.3  ml     ----------  LA volume/bsa, S                           31.4  ml/m^2 ----------  LA volume, ES, 1-p A4C                     60.4  ml     ----------  LA volume/bsa, ES, 1-p A4C                 33.1  ml/m^2 ----------  LA volume, ES, 1-p A2C                     54.9  ml     ----------  LA volume/bsa, ES, 1-p A2C                 30.1  ml/m^2 ----------    Mitral valve                               Value        Reference  Mitral E-wave peak velocity                110   cm/s   ----------  Mitral A-wave peak velocity  48    cm/s   ----------  Mitral deceleration time           (L)     137   ms     150 - 230  Mitral peak gradient, D                    5     mm Hg  ----------  Mitral E/A ratio, peak                     2.3          ----------  Mitral maximal regurg velocity,            523   cm/s   ----------  PISA  Mitral regurg VTI, PISA                    141   cm     ----------  Mitral ERO, PISA                           0.1   cm^2   ----------  Mitral  regurg volume, PISA                 14    ml     ----------    Pulmonary arteries                         Value        Reference  PA pressure, S, DP                 (H)     61    mm Hg  <=30    Tricuspid valve                            Value        Reference  Tricuspid regurg peak velocity             381   cm/s   ----------  Tricuspid peak RV-RA gradient              58    mm Hg  ----------    Right atrium                               Value        Reference  RA ID, S-I, ES, A4C                (H)     50.1  mm     34 - 49  RA area, ES, A4C                           17.4  cm^2   8.3 - 19.5  RA volume, ES, A/L                         48    ml     ----------  RA volume/bsa, ES, A/L                     26.3  ml/m^2 ----------    Systemic veins  Value        Reference  Estimated CVP                              3     mm Hg  ----------    Right ventricle                            Value        Reference  TAPSE                                      19.9  mm     ----------  RV pressure, S, DP                 (H)     61    mm Hg  <=30  RV s&', lateral, S                          10.6  cm/s   ----------  Legend: (L)  and  (H)  mark values outside specified reference range.  ------------------------------------------------------------------- Michaelle Birks, MD 2018-07-16T12:21:39     Transesophageal Echocardiography  Patient:    Katie [name], Katie [name] MR #:       [id number] Study Date: 02/18/2017 Gender:     F Age:        29 Height:     157.5 cm Weight:     70 kg BSA:        1.77 m^2 Pt. Status: Room:       [id number]   PERFORMING   Katie [name], M.D.  ATTENDING    Wynetta Emery, [place]  ADMITTING    Reubin Milan  ORDERING     Phylliss Bob, Janine  Lesli Albee, [place]  cc:  ------------------------------------------------------------------- LV EF: 45% -    50%  ------------------------------------------------------------------- Indications:      CVA 56.  ------------------------------------------------------------------- History:   PMH:   Congestive heart failure.  Risk factors: Hypertension. Dyslipidemia.  ------------------------------------------------------------------- Study Conclusions  - Left ventricle: Systolic function was mildly reduced. The   estimated ejection fraction was in the range of 45% to 50%. - Aortic valve: No evidence of vegetation. - Mitral valve: There was moderate to severe regurgitation. - Left atrium: No evidence of thrombus in the atrial cavity or   appendage. - Tricuspid valve: There was mild-moderate regurgitation.  ------------------------------------------------------------------- Study data:   Study status:  Routine.  Consent:  The risks, benefits, and alternatives to the procedure were explained to the patient and informed consent was obtained.  Procedure:  The patient reported no pain pre or post test. Initial setup. The patient was brought to the laboratory. Surface ECG leads were monitored. Sedation. Conscious sedation was administered by cardiology staff. Transesophageal echocardiography. Topical anesthesia was obtained using viscous lidocaine. A transesophageal probe was inserted by the attending cardiologist. Image quality was adequate.  Study completion:  The patient tolerated the procedure well. There were no complications.  Administered medications:   Fentanyl, 88mcg. Midazolam, 4mg .          Diagnostic transesophageal echocardiography.  2D and color Doppler.  Birthdate:  Patient birthdate: Dec 13, 1945.  Age:  Patient is 71 yr old.  Sex:  Gender: female.  BMI: 28.2 kg/m^2.  Blood pressure:     104/67  Patient status:  Inpatient.  Study date:  Study date: 02/18/2017. Study time: 12:18 PM.  Location:   Endoscopy.  -------------------------------------------------------------------  ------------------------------------------------------------------- Left ventricle:  Systolic function was mildly reduced. The estimated ejection fraction was in the range of 45% to 50%.  ------------------------------------------------------------------- Aortic valve:   Structurally normal valve.   Cusp separation was normal.  No evidence of vegetation.  Doppler:  There was no regurgitation.  ------------------------------------------------------------------- Aorta:  The aorta was mildly calcified.  ------------------------------------------------------------------- Mitral valve:  There is no significant reversal of flow in the pulmonary veins. Doppler:  There was moderate to severe regurgitation.  ------------------------------------------------------------------- Left atrium:   No evidence of thrombus in the atrial cavity or appendage.  ------------------------------------------------------------------- Tricuspid valve:   The valve appears to be grossly normal. Doppler:  There was mild-moderate regurgitation.   ------------------------------------------------------------------- Post procedure conclusions Ascending Aorta:  - The aorta was mildly calcified.  ------------------------------------------------------------------- Prepared and Electronically Authenticated by  Katie [name], M.D. 2018-07-20T13:49:40   Impression:  Patient describes a history of symptoms of chronic fatigue with exertional shortness of breath, orthopnea, and lower extremity edema consistent with combined chronic systolic and diastolic congestive heart failure, New York Heart Association functional class IIb.   She has been hospitalized on at least 2 previous occasions for acute exacerbations of congestive heart failure, beginning in June 2017.  I have personally reviewed the patient's most recent  transthoracic and transesophageal echocardiograms as well as the diagnostic cardiac catheterization performed in June 2017. The patient has at least moderate left ventricular hypertrophy with significant diastolic dysfunction. Left ventricular systolic function is only mildly reduced. There are mildly thickened mitral valve leaflets but normal leaflet mobility, consistent with type I dysfunction.  The jet of mitral regurgitation is broad and central and consistent with at least moderate (3+) mitral regurgitation. There was no flow reversal in the pulmonary veins. No attempts to quantify the severity of mitral regurgitation were performed. There is moderate left atrial enlargement. The aortic valve is normal. There is moderate right atrial enlargement and moderate (2+) central tricuspid regurgitation.  Further complicating the patient's symptoms of chronic shortness of breath include the presence of stage III chronic kidney disease with previous nephrectomy. Even more importantly, the patient suffered a stroke this past July with findings consistent with an embolic event, but no specific etiology has been identified. TEE revealed no cardiac source for stroke and implanted loop recorder reveals no evidence of atrial dysrhythmias to date.  At present there are no indications for surgical intervention because of the patient's recent stroke, and under the circumstances risks associated with elective surgery might be somewhat elevated.  Overall I suspect the patient might benefit from elective mitral valve repair (ring annuloplasty) with or without tricuspid valve repair. However, I suspect the patient's valvular heart disease is most consistent with secondary mitral regurgitation and I am not convinced that she has failed medical therapy. Moreover, the patient's symptoms of congestive heart failure are unquestionably multifactorial, and to date previous TTE and TEE revealed findings consistent with only moderate mitral  regurgitation.    Plan:  We will request to have the patient evaluated in the advanced heart failure clinic to see if her symptoms and mitral regurgitation can be substantially improved using a more aggressive approach to her medical therapy. The patient will return to our office in approximately 3 months where we will further discuss the potential benefits of surgical intervention if she fails  aggressive medical therapy. All of her questions have been addressed.  I spent in excess of 90 minutes during the conduct of this office consultation and >50% of this time involved direct face-to-face encounter with the patient for counseling and/or coordination of their care.   Valentina Gu. [name], MD 04/27/2017 1:16 PM

## 2017-04-27 NOTE — Patient Instructions (Signed)
Continue all previous medications without any changes at this time  Schedule an appointment in the advanced heart failure clinic as soon as practical

## 2017-04-28 ENCOUNTER — Encounter: Payer: Medicare Other | Admitting: Thoracic Surgery (Cardiothoracic Vascular Surgery)

## 2017-05-08 ENCOUNTER — Emergency Department (HOSPITAL_COMMUNITY): Payer: Medicare Other

## 2017-05-08 ENCOUNTER — Inpatient Hospital Stay (HOSPITAL_COMMUNITY)
Admission: EM | Admit: 2017-05-08 | Discharge: 2017-05-11 | DRG: 291 | Disposition: A | Discharge status: Home / Self Care | Payer: Medicare Other | Attending: Internal Medicine | Admitting: Internal Medicine

## 2017-05-08 ENCOUNTER — Encounter (HOSPITAL_COMMUNITY): Payer: Self-pay | Admitting: Emergency Medicine

## 2017-05-08 DIAGNOSIS — Z888 Allergy status to other drugs, medicaments and biological substances status: Secondary | ICD-10-CM

## 2017-05-08 DIAGNOSIS — I10 Essential (primary) hypertension: Secondary | ICD-10-CM | POA: Diagnosis present

## 2017-05-08 DIAGNOSIS — I272 Pulmonary hypertension, unspecified: Secondary | ICD-10-CM | POA: Diagnosis present

## 2017-05-08 DIAGNOSIS — G43709 Chronic migraine without aura, not intractable, without status migrainosus: Secondary | ICD-10-CM | POA: Diagnosis present

## 2017-05-08 DIAGNOSIS — Z85528 Personal history of other malignant neoplasm of kidney: Secondary | ICD-10-CM

## 2017-05-08 DIAGNOSIS — I509 Heart failure, unspecified: Secondary | ICD-10-CM

## 2017-05-08 DIAGNOSIS — E039 Hypothyroidism, unspecified: Secondary | ICD-10-CM | POA: Diagnosis present

## 2017-05-08 DIAGNOSIS — I361 Nonrheumatic tricuspid (valve) insufficiency: Secondary | ICD-10-CM | POA: Diagnosis not present

## 2017-05-08 DIAGNOSIS — Z823 Family history of stroke: Secondary | ICD-10-CM | POA: Diagnosis not present

## 2017-05-08 DIAGNOSIS — Z7902 Long term (current) use of antithrombotics/antiplatelets: Secondary | ICD-10-CM | POA: Diagnosis not present

## 2017-05-08 DIAGNOSIS — E876 Hypokalemia: Secondary | ICD-10-CM | POA: Diagnosis present

## 2017-05-08 DIAGNOSIS — Z905 Acquired absence of kidney: Secondary | ICD-10-CM

## 2017-05-08 DIAGNOSIS — I340 Nonrheumatic mitral (valve) insufficiency: Secondary | ICD-10-CM | POA: Diagnosis not present

## 2017-05-08 DIAGNOSIS — E7800 Pure hypercholesterolemia, unspecified: Secondary | ICD-10-CM | POA: Diagnosis present

## 2017-05-08 DIAGNOSIS — I130 Hypertensive heart and chronic kidney disease with heart failure and stage 1 through stage 4 chronic kidney disease, or unspecified chronic kidney disease: Principal | ICD-10-CM | POA: Diagnosis present

## 2017-05-08 DIAGNOSIS — Z8673 Personal history of transient ischemic attack (TIA), and cerebral infarction without residual deficits: Secondary | ICD-10-CM | POA: Diagnosis not present

## 2017-05-08 DIAGNOSIS — I5043 Acute on chronic combined systolic (congestive) and diastolic (congestive) heart failure: Secondary | ICD-10-CM | POA: Diagnosis present

## 2017-05-08 DIAGNOSIS — I639 Cerebral infarction, unspecified: Secondary | ICD-10-CM | POA: Diagnosis present

## 2017-05-08 DIAGNOSIS — N183 Chronic kidney disease, stage 3 unspecified: Secondary | ICD-10-CM | POA: Diagnosis present

## 2017-05-08 DIAGNOSIS — I5032 Chronic diastolic (congestive) heart failure: Secondary | ICD-10-CM | POA: Insufficient documentation

## 2017-05-08 DIAGNOSIS — R079 Chest pain, unspecified: Secondary | ICD-10-CM | POA: Diagnosis present

## 2017-05-08 DIAGNOSIS — E785 Hyperlipidemia, unspecified: Secondary | ICD-10-CM | POA: Diagnosis not present

## 2017-05-08 DIAGNOSIS — I5033 Acute on chronic diastolic (congestive) heart failure: Secondary | ICD-10-CM | POA: Diagnosis present

## 2017-05-08 DIAGNOSIS — Z833 Family history of diabetes mellitus: Secondary | ICD-10-CM | POA: Diagnosis not present

## 2017-05-08 DIAGNOSIS — I081 Rheumatic disorders of both mitral and tricuspid valves: Secondary | ICD-10-CM | POA: Diagnosis present

## 2017-05-08 LAB — CBC
HCT: 38.9 % (ref 36.0–46.0)
Hemoglobin: 12.4 g/dL (ref 12.0–15.0)
MCH: 30.4 pg (ref 26.0–34.0)
MCHC: 31.9 g/dL (ref 30.0–36.0)
MCV: 95.3 fL (ref 78.0–100.0)
Platelets: 218 10*3/uL (ref 150–400)
RBC: 4.08 MIL/uL (ref 3.87–5.11)
RDW: 14.2 % (ref 11.5–15.5)
WBC: 3.8 10*3/uL — ABNORMAL LOW (ref 4.0–10.5)

## 2017-05-08 LAB — COMPREHENSIVE METABOLIC PANEL
ALT: 55 U/L — ABNORMAL HIGH (ref 14–54)
AST: 56 U/L — ABNORMAL HIGH (ref 15–41)
Albumin: 3.6 g/dL (ref 3.5–5.0)
Alkaline Phosphatase: 136 U/L — ABNORMAL HIGH (ref 38–126)
Anion gap: 10 (ref 5–15)
BUN: 25 mg/dL — ABNORMAL HIGH (ref 6–20)
CO2: 23 mmol/L (ref 22–32)
Calcium: 9 mg/dL (ref 8.9–10.3)
Chloride: 107 mmol/L (ref 101–111)
Creatinine, Ser: 1.66 mg/dL — ABNORMAL HIGH (ref 0.44–1.00)
GFR calc Af Amer: 35 mL/min — ABNORMAL LOW (ref >60)
GFR calc non Af Amer: 30 mL/min — ABNORMAL LOW (ref >60)
Glucose, Bld: 91 mg/dL (ref 65–99)
Potassium: 5.8 mmol/L — ABNORMAL HIGH (ref 3.5–5.1)
Sodium: 140 mmol/L (ref 135–145)
Total Bilirubin: 1.7 mg/dL — ABNORMAL HIGH (ref 0.3–1.2)
Total Protein: 6.5 g/dL (ref 6.5–8.1)

## 2017-05-08 LAB — BRAIN NATRIURETIC PEPTIDE: B Natriuretic Peptide: 740.6 pg/mL — ABNORMAL HIGH (ref 0.0–100.0)

## 2017-05-08 LAB — I-STAT TROPONIN, ED: Troponin i, poc: 0.06 ng/mL (ref 0.00–0.08)

## 2017-05-08 LAB — TROPONIN I: Troponin I: 0.05 ng/mL (ref <0.03)

## 2017-05-08 MED ORDER — ONDANSETRON HCL 4 MG/2ML IJ SOLN
4.0000 mg | Freq: Three times a day (TID) | INTRAMUSCULAR | Status: DC | PRN
  Administered 2017-05-10: 4 mg via INTRAVENOUS
  Filled 2017-05-08: qty 2

## 2017-05-08 MED ORDER — LEVOTHYROXINE SODIUM 25 MCG PO TABS
25.0000 ug | ORAL_TABLET | Freq: Every day | ORAL | Status: DC
  Administered 2017-05-09 – 2017-05-11 (×3): 25 ug via ORAL
  Filled 2017-05-08 (×3): qty 1

## 2017-05-08 MED ORDER — NITROGLYCERIN 0.4 MG SL SUBL: 0.4000 mg | SUBLINGUAL_TABLET | SUBLINGUAL | Status: DC | PRN

## 2017-05-08 MED ORDER — FUROSEMIDE 10 MG/ML IJ SOLN: 40.0000 mg | Freq: Two times a day (BID) | INTRAMUSCULAR | Status: DC

## 2017-05-08 MED ORDER — SODIUM CHLORIDE 0.9% FLUSH: 3.0000 mL | INTRAVENOUS | Status: DC | PRN

## 2017-05-08 MED ORDER — SODIUM CHLORIDE 0.9 % IV SOLN: 250.0000 mL | INTRAVENOUS | Status: DC | PRN

## 2017-05-08 MED ORDER — ASPIRIN 325 MG PO TABS
325.0000 mg | ORAL_TABLET | Freq: Every day | ORAL | Status: DC
  Administered 2017-05-09 – 2017-05-11 (×3): 325 mg via ORAL
  Filled 2017-05-08 (×3): qty 1

## 2017-05-08 MED ORDER — CLOPIDOGREL BISULFATE 75 MG PO TABS
75.0000 mg | ORAL_TABLET | Freq: Every day | ORAL | Status: DC
  Administered 2017-05-09 – 2017-05-11 (×3): 75 mg via ORAL
  Filled 2017-05-08 (×3): qty 1

## 2017-05-08 MED ORDER — ZOLPIDEM TARTRATE 5 MG PO TABS: 5.0000 mg | ORAL_TABLET | Freq: Every evening | ORAL | Status: DC | PRN

## 2017-05-08 MED ORDER — METOPROLOL TARTRATE 12.5 MG HALF TABLET
12.5000 mg | ORAL_TABLET | Freq: Two times a day (BID) | ORAL | Status: DC
  Administered 2017-05-08 – 2017-05-11 (×6): 12.5 mg via ORAL
  Filled 2017-05-08 (×6): qty 1

## 2017-05-08 MED ORDER — MORPHINE SULFATE (PF) 4 MG/ML IV SOLN: 1.0000 mg | INTRAVENOUS | Status: DC | PRN

## 2017-05-08 MED ORDER — ACETAMINOPHEN 325 MG PO TABS: 650.0000 mg | ORAL_TABLET | Freq: Four times a day (QID) | ORAL | Status: DC | PRN

## 2017-05-08 MED ORDER — ENOXAPARIN SODIUM 40 MG/0.4ML ~~LOC~~ SOLN
40.0000 mg | SUBCUTANEOUS | Status: DC
  Administered 2017-05-09 – 2017-05-11 (×3): 40 mg via SUBCUTANEOUS
  Filled 2017-05-08 (×3): qty 0.4

## 2017-05-08 MED ORDER — FUROSEMIDE 10 MG/ML IJ SOLN
40.0000 mg | Freq: Every day | INTRAMUSCULAR | Status: DC
  Administered 2017-05-09 – 2017-05-10 (×2): 40 mg via INTRAVENOUS
  Filled 2017-05-08 (×4): qty 4

## 2017-05-08 MED ORDER — PRAVASTATIN SODIUM 40 MG PO TABS
80.0000 mg | ORAL_TABLET | Freq: Every day | ORAL | Status: DC
  Administered 2017-05-08 – 2017-05-10 (×3): 80 mg via ORAL
  Filled 2017-05-08 (×3): qty 2

## 2017-05-08 MED ORDER — SENNOSIDES-DOCUSATE SODIUM 8.6-50 MG PO TABS: 1.0000 | ORAL_TABLET | Freq: Every evening | ORAL | Status: DC | PRN

## 2017-05-08 MED ORDER — SODIUM CHLORIDE 0.9% FLUSH
3.0000 mL | Freq: Two times a day (BID) | INTRAVENOUS | Status: DC
  Administered 2017-05-08 – 2017-05-10 (×5): 3 mL via INTRAVENOUS

## 2017-05-08 MED ORDER — HYDRALAZINE HCL 20 MG/ML IJ SOLN: 5.0000 mg | INTRAMUSCULAR | Status: DC | PRN

## 2017-05-08 MED ORDER — BUTALBITAL-APAP-CAFFEINE 50-325-40 MG PO TABS
1.0000 | ORAL_TABLET | Freq: Two times a day (BID) | ORAL | Status: DC | PRN
  Administered 2017-05-09 – 2017-05-10 (×2): 1 via ORAL
  Filled 2017-05-08 (×2): qty 1

## 2017-05-08 NOTE — H&P (Addendum)
History and Physical    Katie [name] MHD:[id number] DOB: 1946/01/11 DOA: 05/08/2017  Referring MD/NP/PA:   PCP: Seward Carol, MD   Patient coming from:  The patient is coming from home.  At baseline, pt is independent for most of ADL.   Chief Complaint: Chest pain, shortness breath, worsening leg edema  HPI: Katie [name] is a 71 y.o. female with medical history significant of hypertension, hyperlipidemia, stroke, GERD, hypothyroidism, pulmonary hypertension, loop recorder placement, kidney cancer (d/p of right nephrectomy 2017), CKD-3, dCHF, who presents with chest pain, shortness breath and worsening leg edema.  Patient states that she has been having intermittent chest pain in the past 2 days. The chest pain is located in the frontal chest, pressure-like, nonradiating, 5 out of 10 in severity. It happens every 10-15 minutes, each time lasted for a few seconds, then resolves spontaneously. Patient also has intermittent mild shortness of breath, but no cough, fever or chills. She states that she has worsening leg edema in the past 7-10 days. She has gained approximately 3-4 pounds recently. Patient states that she increased her Lasix dose from 20 to 40 mg daily since yesterday, but no significant help. Patient denies nausea, vomiting, diarrhea, abdominal pain, symptoms of UTI, or unilateral weakness.  ED Course: pt was found to have BNP 740, negative troponin, WBC 3.8, temperature normal, no tachycardia, O2 sat are 99% on room air. Pending CMP. Chest x-ray showed worsening cardiomegaly, but no infiltration. Patient is admitted to telemetry bed as inpatient.  Review of Systems:   General: no fevers, chills, has body weight gain, has poor appetite, has fatigue HEENT: no blurry vision, hearing changes or sore throat Respiratory: has dyspnea, no coughing, wheezing CV: has chest pain, no palpitations GI: no nausea, vomiting, abdominal pain, diarrhea, constipation GU: no dysuria, burning on  urination, increased urinary frequency, hematuria  Ext: has leg edema Neuro: no unilateral weakness, numbness, or tingling, no vision change or hearing loss Skin: no rash, no skin tear. MSK: No muscle spasm, no deformity, no limitation of range of movement in spin Heme: No easy bruising.  Travel history: No recent long distant travel.  Allergy:  Allergies  Allergen Reactions  . Tramadol Shortness Of Breath, Palpitations and Other (See Comments)    Past Medical History:  Diagnosis Date  . Chronic combined systolic and diastolic CHF (congestive heart failure) ([place])   . CKD (chronic kidney disease), stage III ([place])    removal of right kidney due to carcinoma  . Headache   . History of kidney cancer    a. s/p right nephrectomy ~2007.  Marland Kitchen History of loop recorder   . Hypercholesterolemia   . Hypertension   . Mitral regurgitation   . Pulmonary hypertension ([place])   . Stroke [facility])     Past Surgical History:  Procedure Laterality Date  . CARDIAC CATHETERIZATION N/A 01/12/2016   Procedure: Right/Left Heart Cath and Coronary/Graft Angiography;  Surgeon: Belva Crome, MD;  Location: [place] [facility];  Service: Cardiovascular;  Laterality: N/A;  . CERVICAL POLYPECTOMY N/A 04/23/2013   Procedure: CERVICAL POLYPECTOMY;  Surgeon: Maeola Sarah. Landry Mellow, MD;  Location: [place] [facility];  Service: Gynecology;  Laterality: N/A;  Possible Polypectomy  . HYSTEROSCOPY W/D&C N/A 04/23/2013   Procedure: DILATATION AND CURETTAGE /HYSTEROSCOPY;  Surgeon: Maeola Sarah. Landry Mellow, MD;  Location: [place] [facility];  Service: Gynecology;  Laterality: N/A;  . IR ANGIO INTRA EXTRACRAN SEL COM CAROTID INNOMINATE BILAT MOD SED  02/11/2017  . IR ANGIO VERTEBRAL SEL VERTEBRAL  BILAT MOD SED  02/11/2017  . KNEE ARTHROSCOPY Left   . LOOP RECORDER INSERTION N/A 02/18/2017   Procedure: Loop Recorder Insertion;  Surgeon: Constance Haw, MD;  Location: [place] [facility];  Service: Cardiovascular;  Laterality: N/A;  . NEPHRECTOMY Right    due to  carcinoma  . TEE WITHOUT CARDIOVERSION N/A 02/18/2017   Procedure: TRANSESOPHAGEAL ECHOCARDIOGRAM (TEE) WITH LOOP;  Surgeon: Acie Fredrickson Wonda Cheng, MD;  Location: [facility] ENDOSCOPY;  Service: Cardiovascular;  Laterality: N/A;    Social History:  reports that she has never smoked. She has never used smokeless tobacco. She reports that she does not drink alcohol or use drugs.  Family History:  Family History  Problem Relation Age of Onset  . Stroke Brother   . Diabetes Mellitus II Sister      Prior to Admission medications   Medication Sig Start Date End Date Taking? Authorizing Provider  acetaminophen (TYLENOL) 500 MG tablet Take 1,000 mg by mouth every 6 (six) hours as needed for headache (pain).   Yes [provider]  aspirin 325 MG tablet Take 1 tablet (325 mg total) by mouth daily. 02/17/17  Yes Geradine Girt, DO  butalbital-acetaminophen-caffeine (FIORICET, ESGIC) [phone] MG tablet Take 1 tablet by mouth every 12 (twelve) hours as needed for headache. 02/17/17  Yes Eulogio Bear U, DO  clopidogrel (PLAVIX) 75 MG tablet Take 1 tablet (75 mg total) by mouth daily. 02/13/17  Yes Lady Deutscher, MD  feeding supplement, ENSURE ENLIVE, (ENSURE ENLIVE) LIQD Take 237 mLs by mouth 2 (two) times daily between meals. 02/17/17  Yes Vann, Jessica U, DO  furosemide (LASIX) 20 MG tablet Take 20 mg by mouth See admin instructions. Take 1 tablet (20 mg) by mouth daily , may take an extra tablet (20 mg) daily as needed for feet and ankle swelling 09/09/16  Yes [provider]  metoprolol tartrate (LOPRESSOR) 25 MG tablet Take 0.5 tablets (12.5 mg total) by mouth 2 (two) times daily. 01/24/17  Yes Arbutus Leas, NP  potassium chloride (MICRO-K) 10 MEQ CR capsule Take 2 capsules (20 mEq total) by mouth daily. 07/30/16  Yes Belva Crome, MD  pravastatin (PRAVACHOL) 80 MG tablet Take 80 mg by mouth at bedtime.    Yes [provider]  levothyroxine (SYNTHROID, LEVOTHROID) 25 MCG tablet Take  25 mcg by mouth daily before breakfast.    [provider]  pantoprazole (PROTONIX) 40 MG tablet Take 1 tablet (40 mg total) by mouth daily. Patient not taking: Reported on 05/08/2017 03/24/17 03/24/18  [name], PA    Physical Exam: Vitals:   05/08/17 2200 05/08/17 2230 05/08/17 2300 05/08/17 2324  BP: 133/82 120/77 121/81 118/80  Pulse: 73 68 70 72  Resp: 16 18 16 16   Temp:    99.3 F (37.4 C)  TempSrc:    Oral  SpO2: 98% 98% 98% 100%  Weight:    74.7 kg (164 lb 9.6 oz)  Height:    5\' 3"  (1.6 m)   General: Not in acute distress HEENT:       Eyes: PERRL, EOMI, no scleral icterus.       ENT: No discharge from the ears and nose, no pharynx injection, no tonsillar enlargement.        Neck: Positive JVD, no bruit, no mass felt. Heme: No neck lymph node enlargement. Cardiac: S1/S2, RRR, No murmurs, No gallops or rubs. Respiratory: No rales, wheezing, rhonchi or rubs. GI: Soft, nondistended, nontender, no rebound pain,  no organomegaly, BS present. GU: No hematuria Ext: 2+ pitting leg edema bilaterally. 2+DP/PT pulse bilaterally. Musculoskeletal: No joint deformities, No joint redness or warmth, no limitation of ROM in spin. Skin: No rashes.  Neuro: Alert, oriented X3, cranial nerves II-XII grossly intact, moves all extremities normally.  Psych: Patient is not psychotic, no suicidal or hemocidal ideation.  Labs on Admission: I have personally reviewed following labs and imaging studies  CBC:  Recent Labs Lab 05/08/17 2000  WBC 3.8*  HGB 12.4  HCT 38.9  MCV 95.3  PLT 106   Basic Metabolic Panel:  Recent Labs Lab 05/08/17 2143  NA 140  K 5.8*  CL 107  CO2 23  GLUCOSE 91  BUN 25*  CREATININE 1.66*  CALCIUM 9.0   GFR: Estimated Creatinine Clearance: 30.1 mL/min (A) (by C-G formula based on SCr of 1.66 mg/dL (H)). Liver Function Tests:  Recent Labs Lab 05/08/17 2143  AST 56*  ALT 55*  ALKPHOS 136*  BILITOT 1.7*  PROT 6.5  ALBUMIN 3.6    No results for input(s): LIPASE, AMYLASE in the last 168 hours. No results for input(s): AMMONIA in the last 168 hours. Coagulation Profile: No results for input(s): INR, PROTIME in the last 168 hours. Cardiac Enzymes:  Recent Labs Lab 05/08/17 2143  TROPONINI 0.05*   BNP (last 3 results) No results for input(s): PROBNP in the last 8760 hours. HbA1C: No results for input(s): HGBA1C in the last 72 hours. CBG: No results for input(s): GLUCAP in the last 168 hours. Lipid Profile: No results for input(s): CHOL, HDL, LDLCALC, TRIG, CHOLHDL, LDLDIRECT in the last 72 hours. Thyroid Function Tests: No results for input(s): TSH, T4TOTAL, FREET4, T3FREE, THYROIDAB in the last 72 hours. Anemia Panel: No results for input(s): VITAMINB12, FOLATE, FERRITIN, TIBC, IRON, RETICCTPCT in the last 72 hours. Urine analysis:    Component Value Date/Time   COLORURINE YELLOW 02/14/2017 2340   APPEARANCEUR HAZY (A) 02/14/2017 2340   LABSPEC 1.017 02/14/2017 2340   PHURINE 5.0 02/14/2017 2340   GLUCOSEU NEGATIVE 02/14/2017 2340   HGBUR SMALL (A) 02/14/2017 2340   BILIRUBINUR NEGATIVE 02/14/2017 2340   KETONESUR 5 (A) 02/14/2017 2340   PROTEINUR NEGATIVE 02/14/2017 2340   UROBILINOGEN 0.2 06/27/2009 1452   NITRITE NEGATIVE 02/14/2017 2340   LEUKOCYTESUR NEGATIVE 02/14/2017 2340   Sepsis Labs: @LABRCNTIP (procalcitonin:4,lacticidven:4) )No results found for this or any previous visit (from the past 240 hour(s)).   Radiological Exams on Admission: Dg Chest 2 View  Result Date: 05/08/2017 CLINICAL DATA:  Chest pain EXAM: CHEST  2 VIEW COMPARISON:  02/14/2017 chest radiograph. FINDINGS: Mild-to-moderate cardiomegaly, increased. Otherwise normal mediastinal contour. Loop recorder overlies the left lower chest. No pneumothorax. No pleural effusion. Hyperinflated lungs. No overt pulmonary edema. Mild bibasilar scarring versus atelectasis. No acute consolidative airspace disease. Surgical clips are  noted in the right upper abdomen. IMPRESSION: 1. Worsened cardiomegaly without overt pulmonary edema. No pleural effusions. 2. Hyperinflated lungs, suggesting obstructive lung disease. Mild bibasilar scarring versus atelectasis. Electronically Signed   By: Ilona Sorrel M.D.   On: 05/08/2017 20:05     EKG: Independently reviewed.  Sinus rhythm, low voltage, QTC 462, anteroseptal infarction pattern.  Assessment/Plan Principal Problem:   Acute on chronic diastolic CHF (congestive heart failure) (HCC) Active Problems:   CKD (chronic kidney disease), stage III (HCC)   Hyperlipidemia   Chest pain   Cerebellar stroke (HCC)   Hypertension   Chronic migraine without aura without status migrainosus, not intractable   CHF exacerbation (  [place])   Acute on chronic diastolic CHF (congestive heart failure) [facility]): Patient has worsening leg edema, positive JVD, elevated BNP, consistent with CHF exacerbation. 2-D echo on 02/12/17 showed EF of 55-60% with grade 3 diastolic dysfunction.  -will admit to tele bed as inpt. -Lasix 40 mg daily by IV-->need to f/u renal Fx closely due to presence of single kidney -2d echo -will continue home metoprolol, ASA  Chest pain: Patient has atypical chest pain, likely due to demand ischemia secondary to CHF exacerbation. Initial troponin negative. - cycle CE q6 x3 and repeat EKG in the am  - prn Nitroglycerin, Morphine, and aspirin, pravastatin, metoprolol - Risk factor stratification: will check FLP, UDS and A1C  - 2d echo -Inpatient non-urgent consult order was put in Epic and message to Birdie Sons was sent out.  CKD (chronic kidney disease), stage III ([place]): Slightly worsening. Baseline creatinine 1.3-1.5. Her creatinine is 1.61, BUN 25. -Follow up renal function. BMP  HLD: -Pravastatin  Hx of Cerebellar stroke ([place]): -Continue aspirin, Plavix and pravastatin  Hypertension: -Metoprolol -IV hydralazine when necessary  Chronic migraine without aura without  status migrainosus, not intractable: -prn Floricet  Hypothyroidism: Last TSH was 3.305 on 6/817 -Continue home Synthroid   DVT ppx: SQ Lovenox Code Status: Full code Family Communication: Yes, patient's  duaghter at bed side Disposition Plan:  Anticipate discharge back to previous home environment Consults called: none  Admission status: Inpatient/tele       Date of Service 05/09/2017    Ivor Costa [facility] Pager [phone]  If 7PM-7AM, please contact night-coverage [email or web address] Password [email or web address] 05/09/2017, 12:04 AM

## 2017-05-08 NOTE — ED Provider Notes (Signed)
[facility] DEPT Provider Note   CSN: [id number] Arrival date & time: 05/08/17  [place]     History   Chief Complaint Chief Complaint  Patient presents with  . Chest Pain    HPI Katie [name] is a 71 y.o. female.  HPI   Patient is a 71 year old African-American female with history of chronic combined systolic and diastolic congestive heart failure, mitral regurgitation, stage III chronic kidney disease with h/o nephrectomy in the past due to renal cell CA, hypertension, pulmonary hypertension, recent stroke, and hyperlipidemia.  She is followed by Dr. Leonie Man. She was recently evaluated for valve surgery, was given referral for [facility] for CHF clinic.   She is here today with bilateral leg swelling, worsening shortness of breath and fatigue. Patient reports that she's has had intermittent chest pain. It is not exertionally related, she is a poor historian so difficult to tell when it comes and goes.    Past Medical History:  Diagnosis Date  . Chronic combined systolic and diastolic CHF (congestive heart failure) ([place])   . CKD (chronic kidney disease), stage III ([place])    removal of right kidney due to carcinoma  . Headache   . History of kidney cancer    a. s/p right nephrectomy ~2007.  Marland Kitchen History of loop recorder   . Hypercholesterolemia   . Hypertension   . Mitral regurgitation   . Pulmonary hypertension ([place])   . Stroke [facility])     Patient Active Problem List   Diagnosis Date Noted  . Chronic migraine without aura without status migrainosus, not intractable   . Hypertension 02/15/2017  . Acute on chronic diastolic CHF (congestive heart failure) ([place]) 02/15/2017  . Basilar artery occlusion   . Cerebellar stroke ([place]) 02/10/2017  . Headache 02/10/2017  . Chest pain   . Moderate mitral regurgitation 01/08/2016  . Moderate tricuspid regurgitation 01/08/2016  . Moderate to severe pulmonary hypertension ([place]) 01/08/2016  . Acute CHF (congestive heart failure) ([place])  01/07/2016  . Elevated troponin 01/07/2016  . Mild hypertension 01/07/2016  . CKD (chronic kidney disease), stage III ([place]) 01/07/2016  . Hyperlipidemia 01/07/2016  . Acute CHF ([place]) 01/07/2016    Past Surgical History:  Procedure Laterality Date  . CARDIAC CATHETERIZATION N/A 01/12/2016   Procedure: Right/Left Heart Cath and Coronary/Graft Angiography;  Surgeon: Belva Crome, MD;  Location: [facility] CV LAB;  Service: Cardiovascular;  Laterality: N/A;  . CERVICAL POLYPECTOMY N/A 04/23/2013   Procedure: CERVICAL POLYPECTOMY;  Surgeon: Maeola Sarah. Landry Mellow, MD;  Location: [place] [facility];  Service: Gynecology;  Laterality: N/A;  Possible Polypectomy  . HYSTEROSCOPY W/D&C N/A 04/23/2013   Procedure: DILATATION AND CURETTAGE /HYSTEROSCOPY;  Surgeon: Maeola Sarah. Landry Mellow, MD;  Location: [facility];  Service: Gynecology;  Laterality: N/A;  . IR ANGIO INTRA EXTRACRAN SEL COM CAROTID INNOMINATE BILAT MOD SED  02/11/2017  . IR ANGIO VERTEBRAL SEL VERTEBRAL BILAT MOD SED  02/11/2017  . KNEE ARTHROSCOPY Left   . LOOP RECORDER INSERTION N/A 02/18/2017   Procedure: Loop Recorder Insertion;  Surgeon: Constance Haw, MD;  Location: [facility] CV LAB;  Service: Cardiovascular;  Laterality: N/A;  . NEPHRECTOMY Right    due to carcinoma  . TEE WITHOUT CARDIOVERSION N/A 02/18/2017   Procedure: TRANSESOPHAGEAL ECHOCARDIOGRAM (TEE) WITH LOOP;  Surgeon: Acie Fredrickson Wonda Cheng, MD;  Location: [facility] ENDOSCOPY;  Service: Cardiovascular;  Laterality: N/A;    OB History    No data available       Home Medications    Prior  to Admission medications   Medication Sig Start Date End Date Taking? Authorizing Provider  acetaminophen (TYLENOL) 500 MG tablet Take 1,000 mg by mouth every 6 (six) hours as needed for headache (pain).    [provider]  aspirin 325 MG tablet Take 1 tablet (325 mg total) by mouth daily. 02/17/17   Geradine Girt, DO  butalbital-acetaminophen-caffeine (FIORICET, ESGIC) [phone] MG tablet Take 1 tablet by  mouth every 12 (twelve) hours as needed for headache. 02/17/17   Geradine Girt, DO  clopidogrel (PLAVIX) 75 MG tablet Take 1 tablet (75 mg total) by mouth daily. 02/13/17   Lady Deutscher, MD  feeding supplement, ENSURE ENLIVE, (ENSURE ENLIVE) LIQD Take 237 mLs by mouth 2 (two) times daily between meals. 02/17/17   Geradine Girt, DO  furosemide (LASIX) 20 MG tablet Take 20 mg by mouth daily.  09/09/16   [provider]  latanoprost (XALATAN) 0.005 % ophthalmic solution Place 1 drop into both eyes at bedtime. 02/01/17   [provider]  levothyroxine (SYNTHROID, LEVOTHROID) 25 MCG tablet Take 25 mcg by mouth daily before breakfast.    [provider]  metoprolol tartrate (LOPRESSOR) 25 MG tablet Take 0.5 tablets (12.5 mg total) by mouth 2 (two) times daily. 01/24/17   Arbutus Leas, NP  pantoprazole (PROTONIX) 40 MG tablet Take 1 tablet (40 mg total) by mouth daily. 03/24/17 03/24/18  [name], PA  potassium chloride (MICRO-K) 10 MEQ CR capsule Take 2 capsules (20 mEq total) by mouth daily. 07/30/16   Belva Crome, MD  pravastatin (PRAVACHOL) 80 MG tablet Take 80 mg by mouth at bedtime.     [provider]    Family History Family History  Problem Relation Age of Onset  . Stroke Brother   . Diabetes Mellitus II Sister     Social History Social History  Substance Use Topics  . Smoking status: Never Smoker  . Smokeless tobacco: Never Used  . Alcohol use No     Allergies   Tramadol   Review of Systems Review of Systems  Constitutional: Positive for fatigue. Negative for fever.  Respiratory: Positive for shortness of breath.   Cardiovascular: Positive for chest pain.  Gastrointestinal: Negative for abdominal pain.  All other systems reviewed and are negative.    Physical Exam Updated Vital Signs Temp 97.8 F (36.6 C) (Oral)   Ht 5\' 3"  (1.6 m)   Wt 79.4 kg (175 lb)   LMP 02/12/2013   BMI 31.00 kg/m   Physical Exam    Constitutional: She is oriented to person, place, and time. She appears well-developed and well-nourished.  HENT:  Head: Normocephalic and atraumatic.  Eyes: Right eye exhibits no discharge. Left eye exhibits no discharge.  Cardiovascular: Normal rate and regular rhythm.   Pulmonary/Chest: Effort normal and breath sounds normal.  Mild crackles base bibasilar.  Abdominal: Soft. She exhibits no distension. There is no tenderness.  Neurological: She is oriented to person, place, and time.  Skin: Skin is warm and dry. She is not diaphoretic.  Psychiatric: She has a normal mood and affect.  Nursing note and vitals reviewed.    ED Treatments / Results  Labs (all labs ordered are listed, but only abnormal results are displayed) Labs Reviewed  BASIC METABOLIC PANEL  CBC  I-STAT TROPONIN, ED    EKG  EKG Interpretation  Date/Time:  Sunday May 08 2017 19:03:49 EDT Ventricular Rate:  79 PR Interval:    QRS Duration: 82 QT  Interval:  403 QTC Calculation: 462 R Axis:   69 Text Interpretation:  Sinus rhythm Low voltage, extremity and precordial leads Anteroseptal infarct, old No significant change since last tracing Confirmed by Zenovia Jarred [phone]) on 05/08/2017 7:15:24 PM       Radiology No results found.  Procedures Procedures (including critical care time)  Medications Ordered in ED Medications - No data to display   Initial Impression / Assessment and Plan / ED Course  I have reviewed the triage vital signs and the nursing notes.  Pertinent labs & imaging results that were available during my care of the patient were reviewed by me and considered in my medical decision making (see chart for details).     Patient is a 71 year old African-American female with history of chronic combined systolic and diastolic congestive heart failure, mitral regurgitation, stage III chronic kidney disease with h/o nephrectomy in the past due to renal cell CA, hypertension,  pulmonary hypertension, recent stroke, and hyperlipidemia.  She is followed by Dr. Leonie Man. She was recently evaluated for valve surgery, was given referral for [facility] for CHF clinic.   She is here today primarily for chest pain, is located on the right side. Not reproducible.  She also has  bilateral leg swelling, worsening shortness of breath and fatigue. Patient reports that she's has had intermittent chest pain. It is not exertionally related, she is a poor historian so difficult to tell when it comes and goes.is also difficult to tell whether she's gained any weight recently she says she's 3 pounds above her weight yesterday, however she cannot tell me her dry weight.she feels generally generally more fatigued than usual.   9:05 PM Concern for worsening failure versus worsening valve. Heart score is 6. Will admit for surgical troponins,echo, further evlaution.     Final Clinical Impressions(s) / ED Diagnoses   Final diagnoses:  None    New Prescriptions New Prescriptions   No medications on file     Macarthur Critchley, MD 05/09/17 0000

## 2017-05-08 NOTE — ED Triage Notes (Signed)
Pt BIB EMS for CP x 2 days. Pt denies dizziness, weakness, or SOB. Received 1 nitro and 324 ASA PTA. No PIV established. Resp e/u; NAD.

## 2017-05-09 ENCOUNTER — Inpatient Hospital Stay (HOSPITAL_COMMUNITY): Payer: Medicare Other

## 2017-05-09 ENCOUNTER — Other Ambulatory Visit: Payer: Self-pay | Admitting: *Deleted

## 2017-05-09 DIAGNOSIS — I10 Essential (primary) hypertension: Secondary | ICD-10-CM

## 2017-05-09 DIAGNOSIS — E785 Hyperlipidemia, unspecified: Secondary | ICD-10-CM

## 2017-05-09 DIAGNOSIS — I361 Nonrheumatic tricuspid (valve) insufficiency: Secondary | ICD-10-CM

## 2017-05-09 DIAGNOSIS — I5033 Acute on chronic diastolic (congestive) heart failure: Secondary | ICD-10-CM

## 2017-05-09 DIAGNOSIS — G43709 Chronic migraine without aura, not intractable, without status migrainosus: Secondary | ICD-10-CM

## 2017-05-09 DIAGNOSIS — I272 Pulmonary hypertension, unspecified: Secondary | ICD-10-CM

## 2017-05-09 DIAGNOSIS — I340 Nonrheumatic mitral (valve) insufficiency: Secondary | ICD-10-CM

## 2017-05-09 LAB — BASIC METABOLIC PANEL
Anion gap: 11 (ref 5–15)
BUN: 22 mg/dL — ABNORMAL HIGH (ref 6–20)
CO2: 21 mmol/L — ABNORMAL LOW (ref 22–32)
Calcium: 9 mg/dL (ref 8.9–10.3)
Chloride: 110 mmol/L (ref 101–111)
Creatinine, Ser: 1.48 mg/dL — ABNORMAL HIGH (ref 0.44–1.00)
GFR calc Af Amer: 40 mL/min — ABNORMAL LOW (ref >60)
GFR calc non Af Amer: 34 mL/min — ABNORMAL LOW (ref >60)
Glucose, Bld: 85 mg/dL (ref 65–99)
Potassium: 3.9 mmol/L (ref 3.5–5.1)
Sodium: 142 mmol/L (ref 135–145)

## 2017-05-09 LAB — HEMOGLOBIN A1C
Hgb A1c MFr Bld: 5.2 % (ref 4.8–5.6)
Mean Plasma Glucose: 102.54 mg/dL

## 2017-05-09 LAB — LIPID PANEL
Cholesterol: 105 mg/dL (ref 0–200)
HDL: 39 mg/dL — ABNORMAL LOW (ref >40)
LDL Cholesterol: 56 mg/dL (ref 0–99)
Total CHOL/HDL Ratio: 2.7 RATIO
Triglycerides: 48 mg/dL (ref <150)
VLDL: 10 mg/dL (ref 0–40)

## 2017-05-09 LAB — RAPID URINE DRUG SCREEN, HOSP PERFORMED
Amphetamines: NOT DETECTED
Barbiturates: NOT DETECTED
Benzodiazepines: NOT DETECTED
Cocaine: NOT DETECTED
Opiates: NOT DETECTED
Tetrahydrocannabinol: NOT DETECTED

## 2017-05-09 LAB — TROPONIN I
Troponin I: 0.06 ng/mL (ref <0.03)
Troponin I: 0.06 ng/mL (ref <0.03)

## 2017-05-09 LAB — ECHOCARDIOGRAM COMPLETE
Height: 63 in
Weight: 2633.6 oz

## 2017-05-09 LAB — POTASSIUM: Potassium: 3.8 mmol/L (ref 3.5–5.1)

## 2017-05-09 MED ORDER — ACETAMINOPHEN 325 MG PO TABS
650.0000 mg | ORAL_TABLET | Freq: Four times a day (QID) | ORAL | Status: DC | PRN
Start: 2017-05-09 — End: 2017-05-11
  Administered 2017-05-09 – 2017-05-11 (×4): 650 mg via ORAL
  Filled 2017-05-09 (×4): qty 2

## 2017-05-09 NOTE — Consult Note (Signed)
   [facility] Inpatient Consult   05/09/2017  SCHERRIE SENECA 11/08/1945 [id number]  Patient is currently active with [facility] for chronic disease management services.  Patient has been engaged by a [employer] and CSW.  Our community based plan of care has focused on disease management and community resource support.  Patient will receive a post discharge transition of care call and will be evaluated for monthly home visits for assessments and disease process education.  Made Inpatient Case Manager aware that [facility] following. Of note, [facility] Care Management services does not replace or interfere with any services that are needed or arranged by inpatient case management or social work.  For additional questions or referrals please contact:  Natividad Brood, RN BSN [facility] Liaison  [phone] business mobile phone Toll free office [phone]

## 2017-05-09 NOTE — Care Management Note (Signed)
Case Management Note  Patient Details  Name: ELECIA SERAFIN MRN: [id number] Date of Birth: 1946/07/24  Subjective/Objective:   CHF          Action/Plan: Patient lives at home; PCP: Seward Carol, MD; has private insurance with [facility] with prescription drug coverage; patient was recently going to Outpatient Physical Therapy; CM following for DCP.  Expected Discharge Date:  05/11/17               Expected Discharge Plan:  Home In-House Referral:   [facility]  Discharge planning Services  CM Consult  Status of Service:  In process, will continue to follow  Sherrilyn Rist [phone] 05/09/2017, 1:27 PM

## 2017-05-09 NOTE — Patient Outreach (Signed)
[facility]) Care Management  05/09/2017  Katie [name] 04/04/1946 [id number]   Noted that member was admitted to hospital on 10/7, diagnosis of heart failure.  Hospital liaisons notified, will follow up once discharged.  Home visit scheduled for this week pending discharge.    [name], [place], MSN [place] [phone]

## 2017-05-09 NOTE — Progress Notes (Signed)
PROGRESS NOTE    [name]  KWI:[id number] DOB: 1945/09/20 DOA: 05/08/2017 PCP: Seward Carol, MD   Outpatient Specialists:    Brief Narrative:  Katie [name] is a 71 y.o. female with medical history significant of hypertension, hyperlipidemia, stroke, GERD, hypothyroidism, pulmonary hypertension, loop recorder placement, kidney cancer (d/p of right nephrectomy 2017), CKD-3, dCHF, who presents with chest pain, shortness breath and worsening leg edema.  Patient states that she has been having intermittent chest pain in the past 2 days. The chest pain is located in the frontal chest, pressure-like, nonradiating, 5 out of 10 in severity. It happens every 10-15 minutes, each time lasted for a few seconds, then resolves spontaneously. Patient also has intermittent mild shortness of breath, but no cough, fever or chills. She states that she has worsening leg edema in the past 7-10 days. She has gained approximately 3-4 pounds recently. Patient states that she increased her Lasix dose from 20 to 40 mg daily since yesterday, but no significant help. Patient denies nausea, vomiting, diarrhea, abdominal pain, symptoms of UTI, or unilateral weakness.   Assessment & Plan:   Principal Problem:   Acute on chronic diastolic CHF (congestive heart failure) (HCC) Active Problems:   CKD (chronic kidney disease), stage III (HCC)   Hyperlipidemia   Chest pain   Cerebellar stroke (HCC)   Hypertension   Chronic migraine without aura without status migrainosus, not intractable   CHF exacerbation (HCC)   Non-rheumatic mitral regurgitation   Acute on chronic diastolic CHF (congestive heart failure) ([place]):  -IV lasix- strict I/Os -echo shows: mo-severe MR, EF 50% and severe LVH  Chest pain:  -flat CE enzymes -seen by cards: from fluid overload  CKD (chronic kidney disease), stage III ([place]):  -daily BMP while on lasix  HLD: -Pravastatin  Hx of Cerebellar stroke ([place]): -Continue aspirin,  Plavix and pravastatin  Hypertension: -Metoprolol -IV hydralazine when necessary  Chronic migraine without aura without status migrainosus, not intractable: -prn Floricet Depakote d/c'd by Leonie Man  Hypothyroidism:  -Continue home Synthroid   DVT prophylaxis:  Lovenox   Code Status: Full Code   Family Communication:   Disposition Plan:     Consultants:   cards   Subjective: Poor historian-- thinks she is feeling better  Objective: Vitals:   05/08/17 2300 05/08/17 2324 05/09/17 0540 05/09/17 1205  BP: 121/81 118/80 114/77 120/83  Pulse: 70 72 66 71  Resp: 16 16 18 18   Temp:  99.3 F (37.4 C) (!) 97.5 F (36.4 C) 97.6 F (36.4 C)  TempSrc:  Oral Oral Oral  SpO2: 98% 100% 100% 100%  Weight:  74.7 kg (164 lb 9.6 oz)    Height:  5\' 3"  (1.6 m)      Intake/Output Summary (Last 24 hours) at 05/09/17 1510 Last data filed at 05/09/17 1424  Gross per 24 hour  Intake                0 ml  Output             1770 ml  Net            -1770 ml   Filed Weights   05/08/17 1910 05/08/17 2324  Weight: 79.4 kg (175 lb) 74.7 kg (164 lb 9.6 oz)    Examination:  General exam: Appears calm and comfortable  Respiratory system: Crackles in bases Cardiovascular system: rrr, + murmur Gastrointestinal system: +Bs, soft Central nervous system: Alert-- poor historian Extremities: moves all 4 ext Skin: No rashes,  lesions or ulcers     Data Reviewed: I have personally reviewed following labs and imaging studies  CBC:  Recent Labs Lab 05/08/17 2000  WBC 3.8*  HGB 12.4  HCT 38.9  MCV 95.3  PLT 678   Basic Metabolic Panel:  Recent Labs Lab 05/08/17 2143 05/09/17 0018 05/09/17 0328  NA 140  --  142  K 5.8* 3.8 3.9  CL 107  --  110  CO2 23  --  21*  GLUCOSE 91  --  85  BUN 25*  --  22*  CREATININE 1.66*  --  1.48*  CALCIUM 9.0  --  9.0   GFR: Estimated Creatinine Clearance: 33.7 mL/min (A) (by C-G formula based on SCr of 1.48 mg/dL (H)). Liver  Function Tests:  Recent Labs Lab 05/08/17 2143  AST 56*  ALT 55*  ALKPHOS 136*  BILITOT 1.7*  PROT 6.5  ALBUMIN 3.6   No results for input(s): LIPASE, AMYLASE in the last 168 hours. No results for input(s): AMMONIA in the last 168 hours. Coagulation Profile: No results for input(s): INR, PROTIME in the last 168 hours. Cardiac Enzymes:  Recent Labs Lab 05/08/17 2143 05/09/17 0328 05/09/17 0931  TROPONINI 0.05* 0.06* 0.06*   BNP (last 3 results) No results for input(s): PROBNP in the last 8760 hours. HbA1C:  Recent Labs  05/09/17 0328  HGBA1C 5.2   CBG: No results for input(s): GLUCAP in the last 168 hours. Lipid Profile:  Recent Labs  05/09/17 0328  CHOL 105  HDL 39*  LDLCALC 56  TRIG 48  CHOLHDL 2.7   Thyroid Function Tests: No results for input(s): TSH, T4TOTAL, FREET4, T3FREE, THYROIDAB in the last 72 hours. Anemia Panel: No results for input(s): VITAMINB12, FOLATE, FERRITIN, TIBC, IRON, RETICCTPCT in the last 72 hours. Urine analysis:    Component Value Date/Time   COLORURINE YELLOW 02/14/2017 2340   APPEARANCEUR HAZY (A) 02/14/2017 2340   LABSPEC 1.017 02/14/2017 2340   PHURINE 5.0 02/14/2017 2340   GLUCOSEU NEGATIVE 02/14/2017 2340   HGBUR SMALL (A) 02/14/2017 2340   BILIRUBINUR NEGATIVE 02/14/2017 2340   KETONESUR 5 (A) 02/14/2017 2340   PROTEINUR NEGATIVE 02/14/2017 2340   UROBILINOGEN 0.2 06/27/2009 1452   NITRITE NEGATIVE 02/14/2017 2340   LEUKOCYTESUR NEGATIVE 02/14/2017 2340     )No results found for this or any previous visit (from the past 240 hour(s)).    Anti-infectives    None       Radiology Studies: Dg Chest 2 View  Result Date: 05/08/2017 CLINICAL DATA:  Chest pain EXAM: CHEST  2 VIEW COMPARISON:  02/14/2017 chest radiograph. FINDINGS: Mild-to-moderate cardiomegaly, increased. Otherwise normal mediastinal contour. Loop recorder overlies the left lower chest. No pneumothorax. No pleural effusion. Hyperinflated lungs.  No overt pulmonary edema. Mild bibasilar scarring versus atelectasis. No acute consolidative airspace disease. Surgical clips are noted in the right upper abdomen. IMPRESSION: 1. Worsened cardiomegaly without overt pulmonary edema. No pleural effusions. 2. Hyperinflated lungs, suggesting obstructive lung disease. Mild bibasilar scarring versus atelectasis. Electronically Signed   By: Ilona Sorrel M.D.   On: 05/08/2017 20:05        Scheduled Meds: . aspirin  325 mg Oral Daily  . clopidogrel  75 mg Oral Daily  . enoxaparin (LOVENOX) injection  40 mg Subcutaneous Q24H  . furosemide  40 mg Intravenous Daily  . levothyroxine  25 mcg Oral QAC breakfast  . metoprolol tartrate  12.5 mg Oral BID  . pravastatin  80 mg Oral QHS  .  sodium chloride flush  3 mL Intravenous Q12H   Continuous Infusions: . sodium chloride       LOS: 1 day    Time spent: 35 min    [place], DO [facility] Pager [phone]  If 7PM-7AM, please contact night-coverage [email or web address] Password [email or web address] 05/09/2017, 3:10 PM

## 2017-05-09 NOTE — Consult Note (Signed)
Cardiology Consultation:   Patient ID: Katie [name]; [id number]; 1945/09/07   Admit date: 05/08/2017 Date of Consult: 05/09/2017  Primary Care Provider: Seward Carol, MD Primary Cardiologist: Dr. Acie Fredrickson Primary Electrophysiologist:  Dr. Curt Bears   Patient Profile:   Katie [name] is a 71 y.o. female with a hx of chronic systolic and diastolic heart failure, pulmonary hypertension, mitral regurgitation, CKD stage III, HTN, HLD, stroke (01/2017) and s/p ILR, kidney cancer s/p right nephrectomy (2007) who is being seen today for the evaluation of chest pain and CHF at the request of Dr. Eliseo Squires.  History of Present Illness:   Katie [name] is known to this service and last saw Robbie Lis on 03/24/17 and Dr. Curt Bears on 02/18/17. She was admitted on  02/10/17-02/12/17 with headache and found to have acute left-sided stroke with embolic pattern. She was discharged with plans for TEE and loop recorder implant.  She was readmitted 02/14/17-08/21/16 with CHF exacerbation and chest pain; she underwent IV diuresis. It was thought the chest pain was due to CHF exacerbation. TEE on 02/18/17 with EF of 45-50%, moderate to severe MR, and mild-moderate TR. Loop recorder was also placed 02/18/17.   At follow up, she was feeling much better overall but did report fatigue in the morning and residual DOE. She states that she has intemittent chest pressure with and without exertion with self-resolution. She was continued on DAPT per neuro following stroke.   Loop recorder interrogation 03/29/17 without Afib or other arrhythmia.  She was evaluated by [facility] for MR 04/27/17. It was felt that she had not failed medical therapy for her MR yet and that her MR may only be moderate. She was asked to be evaluated in the HF clinic for medication titration and return to [facility] in 3 months. Of note, per clinic notes, medications could previously not be titrated due to marginal pressures in the 80-100s  She reported to [facility] on 05/08/17 with  1-week history of chest pressure and increased fluid status. She received ASA and nitro x 1 on arrival. She states chest pain occurs intermittently 3 times daily and is not associated with activity or rest. The pain resolves spontaneously. She noticed the pain started when she also noticed increased fluid retention and leg swelling. She has been compliant on her medications and states she tried taking 3 lasix pills one day with some resolution of her LE edema. However, edema returned the next day with her her housework. She called EMS because she "didn't feel good."  It was unclear if she didn't feel well because of chest pressure, fatigue, or fluid retention.     Past Medical History:  Diagnosis Date  . Chronic combined systolic and diastolic CHF (congestive heart failure) ([place])   . CKD (chronic kidney disease), stage III ([place])    removal of right kidney due to carcinoma  . Headache   . History of kidney cancer    a. s/p right nephrectomy ~2007.  Marland Kitchen History of loop recorder   . Hypercholesterolemia   . Hypertension   . Mitral regurgitation   . Pulmonary hypertension ([place])   . Stroke [facility])     Past Surgical History:  Procedure Laterality Date  . CARDIAC CATHETERIZATION N/A 01/12/2016   Procedure: Right/Left Heart Cath and Coronary/Graft Angiography;  Surgeon: Belva Crome, MD;  Location: [place] [facility];  Service: Cardiovascular;  Laterality: N/A;  . CERVICAL POLYPECTOMY N/A 04/23/2013   Procedure: CERVICAL POLYPECTOMY;  Surgeon: Maeola Sarah. Landry Mellow, MD;  Location:  [place] [facility];  Service: Gynecology;  Laterality: N/A;  Possible Polypectomy  . HYSTEROSCOPY W/D&C N/A 04/23/2013   Procedure: DILATATION AND CURETTAGE /HYSTEROSCOPY;  Surgeon: Maeola Sarah. Landry Mellow, MD;  Location: [place] [facility];  Service: Gynecology;  Laterality: N/A;  . IR ANGIO INTRA EXTRACRAN SEL COM CAROTID INNOMINATE BILAT MOD SED  02/11/2017  . IR ANGIO VERTEBRAL SEL VERTEBRAL BILAT MOD SED  02/11/2017  . KNEE ARTHROSCOPY Left   . LOOP RECORDER  INSERTION N/A 02/18/2017   Procedure: Loop Recorder Insertion;  Surgeon: Constance Haw, MD;  Location: [place] [facility];  Service: Cardiovascular;  Laterality: N/A;  . NEPHRECTOMY Right    due to carcinoma  . TEE WITHOUT CARDIOVERSION N/A 02/18/2017   Procedure: TRANSESOPHAGEAL ECHOCARDIOGRAM (TEE) WITH LOOP;  Surgeon: Acie Fredrickson Wonda Cheng, MD;  Location: [facility] ENDOSCOPY;  Service: Cardiovascular;  Laterality: N/A;     Home Medications:  Prior to Admission medications   Medication Sig Start Date End Date Taking? Authorizing Provider  acetaminophen (TYLENOL) 500 MG tablet Take 1,000 mg by mouth every 6 (six) hours as needed for headache (pain).   Yes [provider]  aspirin 325 MG tablet Take 1 tablet (325 mg total) by mouth daily. 02/17/17  Yes Geradine Girt, DO  butalbital-acetaminophen-caffeine (FIORICET, ESGIC) [phone] MG tablet Take 1 tablet by mouth every 12 (twelve) hours as needed for headache. 02/17/17  Yes Eulogio Bear U, DO  clopidogrel (PLAVIX) 75 MG tablet Take 1 tablet (75 mg total) by mouth daily. 02/13/17  Yes Lady Deutscher, MD  feeding supplement, ENSURE ENLIVE, (ENSURE ENLIVE) LIQD Take 237 mLs by mouth 2 (two) times daily between meals. 02/17/17  Yes Vann, Jessica U, DO  furosemide (LASIX) 20 MG tablet Take 20 mg by mouth See admin instructions. Take 1 tablet (20 mg) by mouth daily , may take an extra tablet (20 mg) daily as needed for feet and ankle swelling 09/09/16  Yes [provider]  metoprolol tartrate (LOPRESSOR) 25 MG tablet Take 0.5 tablets (12.5 mg total) by mouth 2 (two) times daily. 01/24/17  Yes Arbutus Leas, NP  potassium chloride (MICRO-K) 10 MEQ CR capsule Take 2 capsules (20 mEq total) by mouth daily. 07/30/16  Yes Belva Crome, MD  pravastatin (PRAVACHOL) 80 MG tablet Take 80 mg by mouth at bedtime.    Yes [provider]  levothyroxine (SYNTHROID, LEVOTHROID) 25 MCG tablet Take 25 mcg by mouth daily before breakfast.     [provider]  pantoprazole (PROTONIX) 40 MG tablet Take 1 tablet (40 mg total) by mouth daily. Patient not taking: Reported on 05/08/2017 03/24/17 03/24/18  [name], PA    Inpatient Medications: Scheduled Meds: . aspirin  325 mg Oral Daily  . clopidogrel  75 mg Oral Daily  . enoxaparin (LOVENOX) injection  40 mg Subcutaneous Q24H  . furosemide  40 mg Intravenous Daily  . levothyroxine  25 mcg Oral QAC breakfast  . metoprolol tartrate  12.5 mg Oral BID  . pravastatin  80 mg Oral QHS  . sodium chloride flush  3 mL Intravenous Q12H   Continuous Infusions: . sodium chloride     PRN Meds: sodium chloride, butalbital-acetaminophen-caffeine, hydrALAZINE, morphine injection, nitroGLYCERIN, ondansetron (ZOFRAN) IV, senna-docusate, sodium chloride flush, zolpidem  Allergies:    Allergies  Allergen Reactions  . Tramadol Shortness Of Breath, Palpitations and Other (See Comments)    Social History:   Social History   Social History  . Marital status: Divorced    Spouse name:  N/A  . Number of children: N/A  . Years of education: N/A   Occupational History  . Not on file.   Social History Main Topics  . Smoking status: Never Smoker  . Smokeless tobacco: Never Used  . Alcohol use No  . Drug use: No  . Sexual activity: No   Other Topics Concern  . Not on file   Social History Narrative  . No narrative on file    Family History:    Family History  Problem Relation Age of Onset  . Stroke Brother   . Diabetes Mellitus II Sister      ROS:  Please see the history of present illness.  ROS  All other ROS reviewed and negative.     Physical Exam/Data:   Vitals:   05/08/17 2230 05/08/17 2300 05/08/17 2324 05/09/17 0540  BP: 120/77 121/81 118/80 114/77  Pulse: 68 70 72 66  Resp: 18 16 16 18   Temp:   99.3 F (37.4 C) (!) 97.5 F (36.4 C)  TempSrc:   Oral Oral  SpO2: 98% 98% 100% 100%  Weight:   164 lb 9.6 oz (74.7 kg)   Height:   5\' 3"  (1.6 m)       Intake/Output Summary (Last 24 hours) at 05/09/17 0918 Last data filed at 05/09/17 0100  Gross per 24 hour  Intake                0 ml  Output              150 ml  Net             -150 ml   Filed Weights   05/08/17 1910 05/08/17 2324  Weight: 175 lb (79.4 kg) 164 lb 9.6 oz (74.7 kg)   Body mass index is 29.16 kg/m.  General:  Well nourished, well developed, in no acute distress HEENT: normal Neck: mild JVD Vascular: No carotid bruits Cardiac:  normal S1, S2; RRR; systolic murmur Lungs:  clear to auscultation bilaterally in upper lobes, scattered crackles in bases bilaterally, respirations  unlabored Abd: soft, nontender, no hepatomegaly  Ext: trace to 1+ edema Musculoskeletal:  No deformities, BUE and BLE strength normal and equal Skin: warm and dry  Neuro:  CNs 2-12 intact, no focal abnormalities noted Psych:  Normal affect   EKG:  The EKG was personally reviewed and demonstrates:  sinus Telemetry:  Telemetry was personally reviewed and demonstrates:  sinus  Relevant CV Studies:  Echocardiogram 05/09/17: pending read Study Conclusions  - Left ventricle: The cavity size was normal. Wall thickness was   increased in a pattern of severe LVH. Systolic function was   normal. The estimated ejection fraction was in the range of 50%   to 55%. Wall motion was normal; there were no regional wall   motion abnormalities. Doppler parameters are consistent with   restrictive physiology, indicative of decreased left ventricular   diastolic compliance and/or increased left atrial pressure. - Ventricular septum: The contour showed diastolic flattening and   systolic flattening. - Mitral valve: There was severe regurgitation. - Left atrium: The atrium was severely dilated. - Right atrium: The atrium was severely dilated. - Tricuspid valve: There was moderate-severe regurgitation. - Pulmonary arteries: Systolic pressure was moderately increased.   PA peak pressure: 54 mm Hg  (S). - Pericardium, extracardiac: A trivial pericardial effusion was   identified.  Impressions:  - Normal LV systolic function; restrictive filling; severe LVH;   myocardium with speckled appearance (  consider amyloidosis);   severe MR; severe biatrial enlargement; moderate to severe TR   with moderately elevated pulmonary pressure.  TEE 02/18/17 s/p stroke Study Conclusions - Left ventricle: Systolic function was mildly reduced. The   estimated ejection fraction was in the range of 45% to 50%. - Aortic valve: No evidence of vegetation. - Mitral valve: There was moderate to severe regurgitation. - Left atrium: No evidence of thrombus in the atrial cavity or   appendage. - Tricuspid valve: There was mild-moderate regurgitation.  Right /Left heart cath 01/12/16:  Widely patent/normal coronary arteries.  Mildly to moderately reduced left ventricular systolic function with EF 35-40%. Filling pressures are normal. No mitral regurgitation of significance is noted.  Mildly elevated pulmonary artery pressures with mean PA pressure of 23 mmHg.  RECOMMENDATIONS:  Heart failure therapy to be continued by the primary team.  Overall prognosis for improvement is optimistic.  Laboratory Data:  Chemistry Recent Labs Lab 05/08/17 2143 05/09/17 0018 05/09/17 0328  NA 140  --  142  K 5.8* 3.8 3.9  CL 107  --  110  CO2 23  --  21*  GLUCOSE 91  --  85  BUN 25*  --  22*  CREATININE 1.66*  --  1.48*  CALCIUM 9.0  --  9.0  GFRNONAA 30*  --  34*  GFRAA 35*  --  40*  ANIONGAP 10  --  11     Recent Labs Lab 05/08/17 2143  PROT 6.5  ALBUMIN 3.6  AST 56*  ALT 55*  ALKPHOS 136*  BILITOT 1.7*   Hematology Recent Labs Lab 05/08/17 2000  WBC 3.8*  RBC 4.08  HGB 12.4  HCT 38.9  MCV 95.3  MCH 30.4  MCHC 31.9  RDW 14.2  PLT 218   Cardiac Enzymes Recent Labs Lab 05/08/17 2143 05/09/17 0328  TROPONINI 0.05* 0.06*    Recent Labs Lab 05/08/17 2005  TROPIPOC 0.06     BNP Recent Labs Lab 05/08/17 2000  BNP 740.6*    DDimer No results for input(s): DDIMER in the last 168 hours.  Radiology/Studies:  Dg Chest 2 View  Result Date: 05/08/2017 CLINICAL DATA:  Chest pain EXAM: CHEST  2 VIEW COMPARISON:  02/14/2017 chest radiograph. FINDINGS: Mild-to-moderate cardiomegaly, increased. Otherwise normal mediastinal contour. Loop recorder overlies the left lower chest. No pneumothorax. No pleural effusion. Hyperinflated lungs. No overt pulmonary edema. Mild bibasilar scarring versus atelectasis. No acute consolidative airspace disease. Surgical clips are noted in the right upper abdomen. IMPRESSION: 1. Worsened cardiomegaly without overt pulmonary edema. No pleural effusions. 2. Hyperinflated lungs, suggesting obstructive lung disease. Mild bibasilar scarring versus atelectasis. Electronically Signed   By: Ilona Sorrel M.D.   On: 05/08/2017 20:05    Assessment and Plan:   1. Acute on chronic systolic heart failure - patient was compliant on her home medications, including ASA, plavix, lasix 20 mg daily with PRN instructions for additional tablets, lopressor 12.5 mg BID, and pravastatin - She reports an improvement in her lower extremity swelling and dyspnea when she takes three tablets of lasix.  - BNP on admission 740.6 with sCr 1.48 - will begin IV diuresis with 40 mg IV daily and titrate up - IV diuresis will be limited by her hypotension/marginal pressures - repeat echo pending to monitor for progression of heart failure   2. Chest pain - she had a recent heart catheterization in 2017 with normal coronaries - troponin 0.05 --> 0.06 - it is unlikely that this chest pressure/pain  is related to ACS - she describes the onset of chest pressure that coincided with increase in lower extremity edema and SOB - will defer further ischemic evaluation at this time; troponin elevation consistent with demand ischemia, likely from heart failure exacerbation   3.  Moderate MR and DOE - this dyspnea may be multi-factorial, related to her MR and chronic combined heart failure - will diurese and monitor shortness of breath   4. CKD -sCr 1.48, baseline 1.38 - 1.70 - making urine   For questions or updates, please contact [place] Please consult [email or web address] for contact info under Cardiology/STEMI.   Signed, Ledora Bottcher, [place]  05/09/2017 9:18 AM   Attending Note:   The patient was seen and examined.  Agree with assessment and plan as noted above.  Changes made to the above note as needed.  Patient seen and independently examined with Doreene Adas, PA .   We discussed all aspects of the encounter. I agree with the assessment and plan as stated above.  1.  Acute on chronic combined CHF:  With mod-severe MR Echo today shows EF ~ 50%. Has severe LVH.   Agree that MR looks more severe on todays echo. Also has severe TR.   Continue diuresis  I/O are only -150 cc so far.  LV fnction has improved.   2.  Chest pain :   Non cardiac.   The minimal troponin elevation is not significant . Had no significant cad by cath last year.  Would not pursue this further at this point  She will need a repeat cath prior to MV repair   3. Tricuspid regurg.  Continue diuresis .     I have spent a total of 40 minutes with patient reviewing hospital  notes , telemetry, EKGs, labs and examining patient as well as establishing an assessment and plan that was discussed with the patient. > 50% of time was spent in direct patient care.    Thayer Headings, Brooke Bonito., MD, [facility] 05/09/2017, 11:13 AM [address],  [place] Pager [phone]

## 2017-05-09 NOTE — Progress Notes (Signed)
  Echocardiogram 2D Echocardiogram has been performed.  Katie [name] 05/09/2017, 9:33 AM

## 2017-05-10 DIAGNOSIS — N183 Chronic kidney disease, stage 3 (moderate): Secondary | ICD-10-CM

## 2017-05-10 LAB — BASIC METABOLIC PANEL
Anion gap: 9 (ref 5–15)
BUN: 20 mg/dL (ref 6–20)
CO2: 26 mmol/L (ref 22–32)
Calcium: 9 mg/dL (ref 8.9–10.3)
Chloride: 106 mmol/L (ref 101–111)
Creatinine, Ser: 1.49 mg/dL — ABNORMAL HIGH (ref 0.44–1.00)
GFR calc Af Amer: 40 mL/min — ABNORMAL LOW (ref >60)
GFR calc non Af Amer: 34 mL/min — ABNORMAL LOW (ref >60)
Glucose, Bld: 112 mg/dL — ABNORMAL HIGH (ref 65–99)
Potassium: 3.2 mmol/L — ABNORMAL LOW (ref 3.5–5.1)
Sodium: 141 mmol/L (ref 135–145)

## 2017-05-10 MED ORDER — DIVALPROEX SODIUM 250 MG PO DR TAB
500.0000 mg | DELAYED_RELEASE_TABLET | Freq: Every day | ORAL | Status: DC
  Administered 2017-05-10: 500 mg via ORAL
  Filled 2017-05-10: qty 2

## 2017-05-10 MED ORDER — POTASSIUM CHLORIDE CRYS ER 20 MEQ PO TBCR
20.0000 meq | EXTENDED_RELEASE_TABLET | Freq: Once | ORAL | Status: AC
  Administered 2017-05-10: 20 meq via ORAL
  Filled 2017-05-10: qty 1

## 2017-05-10 MED ORDER — FUROSEMIDE 20 MG PO TABS
20.0000 mg | ORAL_TABLET | Freq: Every day | ORAL | Status: DC
  Administered 2017-05-10: 20 mg via ORAL
  Filled 2017-05-10 (×2): qty 1

## 2017-05-10 NOTE — Progress Notes (Signed)
Progress Note  Patient Name: Katie [name] Date of Encounter: 05/10/2017  Primary Cardiologist: Dr. Acie Fredrickson  Subjective   Pt states she doesn't feel well today. She woke up at 0400 with a headache that is persistent. She also reports continued chest pain.  Inpatient Medications    Scheduled Meds: . aspirin  325 mg Oral Daily  . clopidogrel  75 mg Oral Daily  . enoxaparin (LOVENOX) injection  40 mg Subcutaneous Q24H  . furosemide  40 mg Intravenous Daily  . levothyroxine  25 mcg Oral QAC breakfast  . metoprolol tartrate  12.5 mg Oral BID  . pravastatin  80 mg Oral QHS  . sodium chloride flush  3 mL Intravenous Q12H   Continuous Infusions: . sodium chloride     PRN Meds: sodium chloride, acetaminophen, butalbital-acetaminophen-caffeine, hydrALAZINE, morphine injection, nitroGLYCERIN, ondansetron (ZOFRAN) IV, senna-docusate, sodium chloride flush, zolpidem   Vital Signs    Vitals:   05/09/17 1205 05/09/17 2035 05/09/17 2300 05/10/17 0423  BP: 120/83 (!) 113/27 108/77 130/85  Pulse: 71 68  80  Resp: 18 20  18   Temp: 97.6 F (36.4 C) (!) 97.5 F (36.4 C)  (!) 97.5 F (36.4 C)  TempSrc: Oral Oral  Oral  SpO2: 100% 100%  100%  Weight:    158 lb 3.2 oz (71.8 kg)  Height:        Intake/Output Summary (Last 24 hours) at 05/10/17 1107 Last data filed at 05/10/17 1002  Gross per 24 hour  Intake             1132 ml  Output             3140 ml  Net            -2008 ml   Filed Weights   05/08/17 1910 05/08/17 2324 05/10/17 0423  Weight: 175 lb (79.4 kg) 164 lb 9.6 oz (74.7 kg) 158 lb 3.2 oz (71.8 kg)     Physical Exam   General: Well developed, well nourished, female appearing in no acute distress. Head: Normocephalic, atraumatic.  Neck: Supple without bruits, JVD Lungs:  Resp regular and unlabored, CTA. Heart: RRR, S1, S2, no murmur; no rub. Abdomen: Soft, non-tender, non-distended with normoactive bowel sounds. No hepatomegaly. No rebound/guarding. No obvious  abdominal masses. Extremities: No clubbing, cyanosis, trace to 1+ edema L > R. Distal pedal pulses are 1+ bilaterally. Neuro: Alert and oriented X 3. Moves all extremities spontaneously. Psych: flattened affect  Labs    Chemistry Recent Labs Lab 05/08/17 2143 05/09/17 0018 05/09/17 0328 05/10/17 0525  NA 140  --  142 141  K 5.8* 3.8 3.9 3.2*  CL 107  --  110 106  CO2 23  --  21* 26  GLUCOSE 91  --  85 112*  BUN 25*  --  22* 20  CREATININE 1.66*  --  1.48* 1.49*  CALCIUM 9.0  --  9.0 9.0  PROT 6.5  --   --   --   ALBUMIN 3.6  --   --   --   AST 56*  --   --   --   ALT 55*  --   --   --   ALKPHOS 136*  --   --   --   BILITOT 1.7*  --   --   --   GFRNONAA 30*  --  34* 34*  GFRAA 35*  --  40* 40*  ANIONGAP 10  --  11 9  Hematology Recent Labs Lab 05/08/17 2000  WBC 3.8*  RBC 4.08  HGB 12.4  HCT 38.9  MCV 95.3  MCH 30.4  MCHC 31.9  RDW 14.2  PLT 218    Cardiac Enzymes Recent Labs Lab 05/08/17 2143 05/09/17 0328 05/09/17 0931  TROPONINI 0.05* 0.06* 0.06*    Recent Labs Lab 05/08/17 2005  TROPIPOC 0.06     BNP Recent Labs Lab 05/08/17 2000  BNP 740.6*     DDimer No results for input(s): DDIMER in the last 168 hours.   Radiology    Dg Chest 2 View  Result Date: 05/08/2017 CLINICAL DATA:  Chest pain EXAM: CHEST  2 VIEW COMPARISON:  02/14/2017 chest radiograph. FINDINGS: Mild-to-moderate cardiomegaly, increased. Otherwise normal mediastinal contour. Loop recorder overlies the left lower chest. No pneumothorax. No pleural effusion. Hyperinflated lungs. No overt pulmonary edema. Mild bibasilar scarring versus atelectasis. No acute consolidative airspace disease. Surgical clips are noted in the right upper abdomen. IMPRESSION: 1. Worsened cardiomegaly without overt pulmonary edema. No pleural effusions. 2. Hyperinflated lungs, suggesting obstructive lung disease. Mild bibasilar scarring versus atelectasis. Electronically Signed   By: Ilona Sorrel M.D.    On: 05/08/2017 20:05     Telemetry    Sinus rhythm - Personally Reviewed  ECG    No new tracings - Personally Reviewed   Cardiac Studies   Echocardiogram 05/09/17:  Study Conclusions - Left ventricle: The cavity size was normal. Wall thickness was increased in a pattern of severe LVH. Systolic function was normal. The estimated ejection fraction was in the range of 50% to 55%. Wall motion was normal; there were no regional wall motion abnormalities. Doppler parameters are consistent with restrictive physiology, indicative of decreased left ventricular diastolic compliance and/or increased left atrial pressure. - Ventricular septum: The contour showed diastolic flattening and systolic flattening. - Mitral valve: There was severe regurgitation. - Left atrium: The atrium was severely dilated. - Right atrium: The atrium was severely dilated. - Tricuspid valve: There was moderate-severe regurgitation. - Pulmonary arteries: Systolic pressure was moderately increased. PA peak pressure: 54 mm Hg (S). - Pericardium, extracardiac: A trivial pericardial effusion was identified.  Impressions:  - Normal LV systolic function; restrictive filling; severe LVH; myocardium with speckled appearance (consider amyloidosis); severe MR; severe biatrial enlargement; moderate to severe TR with moderately elevated pulmonary pressure.  TEE 02/18/17 s/p stroke Study Conclusions - Left ventricle: Systolic function was mildly reduced. The estimated ejection fraction was in the range of 45% to 50%. - Aortic valve: No evidence of vegetation. - Mitral valve: There was moderate to severe regurgitation. - Left atrium: No evidence of thrombus in the atrial cavity or appendage. - Tricuspid valve: There was mild-moderate regurgitation.  Right /Left heart cath 01/12/16:  Widely patent/normal coronary arteries.  Mildly to moderately reduced left ventricular systolic  function with EF 35-40%. Filling pressures are normal. No mitral regurgitation of significance is noted.  Mildly elevated pulmonary artery pressures with mean PA pressure of 23 mmHg.  RECOMMENDATIONS:  Heart failure therapy to be continued by the primary team.  Overall prognosis for improvement is optimistic.   Patient Profile     70 y.o. female with a hx of chronic systolic and diastolic heart failure, pulmonary hypertension, mitral regurgitation, CKD stage III, HTN, HLD, stroke (01/2017) and s/p ILR, kidney cancer s/p right nephrectomy (2007) who is being seen for evaluation of chest pain and CHF.   Assessment & Plan    1. Acute on chronic systolic heart failure - began IV  diuresis yesterday with 40 mg lasix daily - she is overall net negative 2L with 2.6 L urine output yesterday - weight is 158 lbs, down from admission weight of 175 lbs - weight in clinic 03/24/17 was 163 lbs - not requiring supplemental oxygen - will consider transitioning IV lasix to 40 mg PO lasix for tomorrow   2. Hypokalemia - K 3.2 (3.9) - gentle supplementation given CKD - 20 mEq Kdur OTO   3. chest pain - pt reports continued chest pain - will order repeat EKG   4. Moderate to sever MR and DOE - MR looks more severe on recent echo   5. CKD - sCr baseline 1.38-1.70 - sCr today 1.49 (1.48)   Signed, Ledora Bottcher , PA-C 11:07 AM 05/10/2017 Pager: [phone]  Attending Note:   The patient was seen and examined.  Agree with assessment and plan as noted above.  Changes made to the above note as needed.  Patient seen and independently examined with Doreene Adas, PA .   We discussed all aspects of the encounter. I agree with the assessment and plan as stated above.  1.   Acute on chronic combined systolic / dystolic CHF:   She has severe LVH with diastolic CHF: also has some systolic chf She appears to be well compensated at this point  Has slight JVD. Trace leg edema Able to  lie flat without any PND or orthopnea    I have spent a total of 40 minutes with patient reviewing hospital  notes , telemetry, EKGs, labs and examining patient as well as establishing an assessment and plan that was discussed with the patient. > 50% of time was spent in direct patient care.    Thayer Headings, Brooke Bonito., MD, [facility] 05/10/2017, 11:44 AM [address],  [place] Pager [phone]

## 2017-05-10 NOTE — Evaluation (Signed)
Physical Therapy Evaluation Patient Details Name: Katie [name] MRN: [id number] DOB: October 27, 1945 Today's Date: 05/10/2017   History of Present Illness  71 y.o. female with medical history significant of hypertension, hyperlipidemia, stroke, GERD, hypothyroidism, pulmonary hypertension, loop recorder placement, kidney cancer (d/p of right nephrectomy 2017), CKD-3, dCHF, who presents with chest pain, shortness breath and worsening leg edema.  Clinical Impression  Pt admitted with above diagnosis. Pt currently with functional limitations due to the deficits listed below (see PT Problem List). Pt has good overall mobility but is limited by decreased activity tolerance. Pt currently mod I for transfers and supervision for ambulation of 250 feet without AD. Pt will benefit from skilled PT to increase their independence and safety with mobility to allow discharge to the venue listed below.       Follow Up Recommendations No PT follow up    Equipment Recommendations  None recommended by PT    Recommendations for Other Services       Precautions / Restrictions Precautions Precautions: None Restrictions Weight Bearing Restrictions: No      Mobility  Bed Mobility               General bed mobility comments: in recliner on entry  Transfers Overall transfer level: Modified independent               General transfer comment: good power up and steadying   Ambulation/Gait Ambulation/Gait assistance: Supervision Ambulation Distance (Feet): 250 Feet Assistive device: None Gait Pattern/deviations: Step-through pattern;Decreased step length - right;Decreased step length - left;Drifts right/left Gait velocity: slowed Gait velocity interpretation: Below normal speed for age/gender General Gait Details: supervision for safety, slow, steady cadence, last 50 feet required standing rest break and use of handrail in hallway    Balance Overall balance assessment: No apparent balance  deficits (not formally assessed)                                           Pertinent Vitals/Pain Pain Assessment: 0-10 Pain Score: 2  Pain Location: headache Pain Descriptors / Indicators: Headache Pain Intervention(s): Limited activity within patient's tolerance;Monitored during session    Home Living Family/patient expects to be discharged to:: Private residence Living Arrangements: Alone Available Help at Discharge: Family;Available PRN/intermittently Type of Home: Apartment Home Access: Level entry     Home Layout: One level Home Equipment: None      Prior Function Level of Independence: Independent         Comments: continues to drive, drives her son to "therapy"        Extremity/Trunk Assessment   Upper Extremity Assessment Upper Extremity Assessment: Overall WFL for tasks assessed    Lower Extremity Assessment Lower Extremity Assessment: Overall WFL for tasks assessed       Communication   Communication: No difficulties  Cognition Arousal/Alertness: Awake/alert Behavior During Therapy: WFL for tasks assessed/performed Overall Cognitive Status: Within Functional Limits for tasks assessed                                        General Comments General comments (skin integrity, edema, etc.): VSS throughout session     Exercises Other Exercises Other Exercises: heel toe walking in straight line Other Exercises: single leg stance x 10 sec each leg    Assessment/Plan  PT Assessment Patient needs continued PT services  PT Problem List Decreased activity tolerance;Cardiopulmonary status limiting activity       PT Treatment Interventions Gait training;Functional mobility training;Therapeutic activities;Therapeutic exercise;Balance training;Patient/family education    PT Goals (Current goals can be found in the Care Plan section)  Acute Rehab PT Goals Patient Stated Goal: go home PT Goal Formulation: With  patient Time For Goal Achievement: 05/24/17 Potential to Achieve Goals: Good    Frequency Min 3X/week    AM-PAC PT "6 Clicks" Daily Activity  Outcome Measure Difficulty turning over in bed (including adjusting bedclothes, sheets and blankets)?: A Little Difficulty moving from lying on back to sitting on the side of the bed? : A Little Difficulty sitting down on and standing up from a chair with arms (e.g., wheelchair, bedside commode, etc,.)?: A Little Help needed moving to and from a bed to chair (including a wheelchair)?: A Little Help needed walking in hospital room?: A Little Help needed climbing 3-5 steps with a railing? : A Lot 6 Click Score: 17    End of Session Equipment Utilized During Treatment: Gait belt Activity Tolerance: Patient tolerated treatment well Patient left: in chair;with call bell/phone within reach;with family/visitor present Nurse Communication: Mobility status PT Visit Diagnosis: Muscle weakness (generalized) (M62.81)    Time: 5809-9833 PT Time Calculation (min) (ACUTE ONLY): 20 min   Charges:   PT Evaluation $PT Eval Low Complexity: 1 Low     PT G Codes:        Katie [name] PT, DPT Acute Rehabilitation  [phone] Pager [phone]    [place] 05/10/2017, 1:37 PM

## 2017-05-10 NOTE — Progress Notes (Signed)
PROGRESS NOTE    Katie [name]  EHU:[id number] DOB: Jun 05, 1946 DOA: 05/08/2017 PCP: Seward Carol, MD   Outpatient Specialists:    Brief Narrative:  Katie [name] is a 71 y.o. female with medical history significant of hypertension, hyperlipidemia, stroke, GERD, hypothyroidism, pulmonary hypertension, loop recorder placement, kidney cancer (d/p of right nephrectomy 2017), CKD-3, dCHF, who presents with chest pain, shortness breath and worsening leg edema.  Patient states that she has been having intermittent chest pain in the past 2 days. The chest pain is located in the frontal chest, pressure-like, nonradiating, 5 out of 10 in severity. It happens every 10-15 minutes, each time lasted for a few seconds, then resolves spontaneously. Patient also has intermittent mild shortness of breath, but no cough, fever or chills. She states that she has worsening leg edema in the past 7-10 days. She has gained approximately 3-4 pounds recently. Patient states that she increased her Lasix dose from 20 to 40 mg daily since yesterday, but no significant help. Patient denies nausea, vomiting, diarrhea, abdominal pain, symptoms of UTI, or unilateral weakness.   Assessment & Plan:   Principal Problem:   Acute on chronic diastolic CHF (congestive heart failure) (HCC) Active Problems:   CKD (chronic kidney disease), stage III (HCC)   Hyperlipidemia   Chest pain   Cerebellar stroke (HCC)   Hypertension   Chronic migraine without aura without status migrainosus, not intractable   CHF exacerbation (HCC)   Non-rheumatic mitral regurgitation   Acute on chronic diastolic CHF (congestive heart failure) ([place]):  -IV lasix- strict I/Os -echo shows: mod-severe MR, EF 50% and severe LVH -continue to diurese  Chest pain:  -flat CE enzymes -seen by cards: pain from fluid overload  CKD (chronic kidney disease), stage III ([place]):  -daily BMP while on lasix  HLD: -Pravastatin  Hx of Cerebellar stroke  ([place]): -Continue aspirin, Plavix and pravastatin  Hypertension: -Metoprolol -IV hydralazine when necessary  Chronic migraine without aura without status migrainosus, not intractable: -prn Floricet Depakote d/c'd by Leonie Man but will resume as headaches coming back -outpatient OSA study as this may be the cause  Hypothyroidism:  -Continue home Synthroid   DVT prophylaxis:  Lovenox   Code Status: Full Code   Family Communication:   Disposition Plan:     Consultants:   cards   Subjective: Awoke with a headache this AM  Objective: Vitals:   05/09/17 2035 05/09/17 2300 05/10/17 0423 05/10/17 1157  BP: (!) 113/27 108/77 130/85 116/74  Pulse: 68  80 72  Resp: 20  18 18   Temp: (!) 97.5 F (36.4 C)  (!) 97.5 F (36.4 C) 97.6 F (36.4 C)  TempSrc: Oral  Oral Oral  SpO2: 100%  100% 99%  Weight:   71.8 kg (158 lb 3.2 oz)   Height:        Intake/Output Summary (Last 24 hours) at 05/10/17 1537 Last data filed at 05/10/17 1157  Gross per 24 hour  Intake              892 ml  Output             1720 ml  Net             -828 ml   Filed Weights   05/08/17 1910 05/08/17 2324 05/10/17 0423  Weight: 79.4 kg (175 lb) 74.7 kg (164 lb 9.6 oz) 71.8 kg (158 lb 3.2 oz)    Examination:  General exam: in bed-- moves to chair well Respiratory system:  Clear Cardiovascular system: rrr Gastrointestinal system: +BS, soft Central nervous system: alert Extremities: moves all 4 ext      Data Reviewed: I have personally reviewed following labs and imaging studies  CBC:  Recent Labs Lab 05/08/17 2000  WBC 3.8*  HGB 12.4  HCT 38.9  MCV 95.3  PLT 009   Basic Metabolic Panel:  Recent Labs Lab 05/08/17 2143 05/09/17 0018 05/09/17 0328 05/10/17 0525  NA 140  --  142 141  K 5.8* 3.8 3.9 3.2*  CL 107  --  110 106  CO2 23  --  21* 26  GLUCOSE 91  --  85 112*  BUN 25*  --  22* 20  CREATININE 1.66*  --  1.48* 1.49*  CALCIUM 9.0  --  9.0 9.0    GFR: Estimated Creatinine Clearance: 32.9 mL/min (A) (by C-G formula based on SCr of 1.49 mg/dL (H)). Liver Function Tests:  Recent Labs Lab 05/08/17 2143  AST 56*  ALT 55*  ALKPHOS 136*  BILITOT 1.7*  PROT 6.5  ALBUMIN 3.6   No results for input(s): LIPASE, AMYLASE in the last 168 hours. No results for input(s): AMMONIA in the last 168 hours. Coagulation Profile: No results for input(s): INR, PROTIME in the last 168 hours. Cardiac Enzymes:  Recent Labs Lab 05/08/17 2143 05/09/17 0328 05/09/17 0931  TROPONINI 0.05* 0.06* 0.06*   BNP (last 3 results) No results for input(s): PROBNP in the last 8760 hours. HbA1C:  Recent Labs  05/09/17 0328  HGBA1C 5.2   CBG: No results for input(s): GLUCAP in the last 168 hours. Lipid Profile:  Recent Labs  05/09/17 0328  CHOL 105  HDL 39*  LDLCALC 56  TRIG 48  CHOLHDL 2.7   Thyroid Function Tests: No results for input(s): TSH, T4TOTAL, FREET4, T3FREE, THYROIDAB in the last 72 hours. Anemia Panel: No results for input(s): VITAMINB12, FOLATE, FERRITIN, TIBC, IRON, RETICCTPCT in the last 72 hours. Urine analysis:    Component Value Date/Time   COLORURINE YELLOW 02/14/2017 2340   APPEARANCEUR HAZY (A) 02/14/2017 2340   LABSPEC 1.017 02/14/2017 2340   PHURINE 5.0 02/14/2017 2340   GLUCOSEU NEGATIVE 02/14/2017 2340   HGBUR SMALL (A) 02/14/2017 2340   BILIRUBINUR NEGATIVE 02/14/2017 2340   KETONESUR 5 (A) 02/14/2017 2340   PROTEINUR NEGATIVE 02/14/2017 2340   UROBILINOGEN 0.2 06/27/2009 1452   NITRITE NEGATIVE 02/14/2017 2340   LEUKOCYTESUR NEGATIVE 02/14/2017 2340     )No results found for this or any previous visit (from the past 240 hour(s)).    Anti-infectives    None       Radiology Studies: Dg Chest 2 View  Result Date: 05/08/2017 CLINICAL DATA:  Chest pain EXAM: CHEST  2 VIEW COMPARISON:  02/14/2017 chest radiograph. FINDINGS: Mild-to-moderate cardiomegaly, increased. Otherwise normal mediastinal  contour. Loop recorder overlies the left lower chest. No pneumothorax. No pleural effusion. Hyperinflated lungs. No overt pulmonary edema. Mild bibasilar scarring versus atelectasis. No acute consolidative airspace disease. Surgical clips are noted in the right upper abdomen. IMPRESSION: 1. Worsened cardiomegaly without overt pulmonary edema. No pleural effusions. 2. Hyperinflated lungs, suggesting obstructive lung disease. Mild bibasilar scarring versus atelectasis. Electronically Signed   By: Ilona Sorrel M.D.   On: 05/08/2017 20:05        Scheduled Meds: . aspirin  325 mg Oral Daily  . clopidogrel  75 mg Oral Daily  . divalproex  500 mg Oral QHS  . enoxaparin (LOVENOX) injection  40 mg Subcutaneous Q24H  .  furosemide  40 mg Intravenous Daily  . levothyroxine  25 mcg Oral QAC breakfast  . metoprolol tartrate  12.5 mg Oral BID  . pravastatin  80 mg Oral QHS  . sodium chloride flush  3 mL Intravenous Q12H   Continuous Infusions: . sodium chloride       LOS: 2 days    Time spent: 25 min    [place], DO [facility] Pager [phone]  If 7PM-7AM, please contact night-coverage [email or web address] Password [email or web address] 05/10/2017, 3:37 PM

## 2017-05-10 NOTE — Progress Notes (Signed)
Notified MD that patient states her headache feels like it did when she had her previous stroke in July.

## 2017-05-11 LAB — BASIC METABOLIC PANEL
Anion gap: 9 (ref 5–15)
BUN: 20 mg/dL (ref 6–20)
CO2: 30 mmol/L (ref 22–32)
Calcium: 9.1 mg/dL (ref 8.9–10.3)
Chloride: 103 mmol/L (ref 101–111)
Creatinine, Ser: 1.52 mg/dL — ABNORMAL HIGH (ref 0.44–1.00)
GFR calc Af Amer: 39 mL/min — ABNORMAL LOW (ref >60)
GFR calc non Af Amer: 33 mL/min — ABNORMAL LOW (ref >60)
Glucose, Bld: 85 mg/dL (ref 65–99)
Potassium: 4.7 mmol/L (ref 3.5–5.1)
Sodium: 142 mmol/L (ref 135–145)

## 2017-05-11 LAB — MAGNESIUM: Magnesium: 2.1 mg/dL (ref 1.7–2.4)

## 2017-05-11 MED ORDER — FUROSEMIDE 40 MG PO TABS
40.0000 mg | ORAL_TABLET | Freq: Every day | ORAL | 0 refills | Status: DC
Start: 2017-05-12 — End: 2017-05-26

## 2017-05-11 MED ORDER — DIVALPROEX SODIUM 500 MG PO DR TAB: 500.0000 mg | DELAYED_RELEASE_TABLET | Freq: Every day | ORAL | 0 refills | Status: DC

## 2017-05-11 MED ORDER — FUROSEMIDE 40 MG PO TABS
40.0000 mg | ORAL_TABLET | Freq: Every day | ORAL | Status: DC
  Administered 2017-05-11: 40 mg via ORAL

## 2017-05-11 MED ORDER — FUROSEMIDE 40 MG PO TABS: 40.0000 mg | ORAL_TABLET | Freq: Every day | ORAL | Status: DC

## 2017-05-11 NOTE — Discharge Summary (Signed)
Physician Discharge Summary  Katie [name] OXB:[id number] DOB: 1945-11-13 DOA: 05/08/2017  PCP: Seward Carol, MD  Admit date: 05/08/2017 Discharge date: 05/11/2017   Recommendations for Outpatient Follow-Up:   1. outpatient sleep study to r/o sleep apnea 2. BMp 1 week re: K and Cr-- increased dose of lasix   Discharge Diagnosis:   Principal Problem:   Acute on chronic diastolic CHF (congestive heart failure) (HCC) Active Problems:   CKD (chronic kidney disease), stage III (HCC)   Hyperlipidemia   Chest pain   Cerebellar stroke ([place])   Hypertension   Chronic migraine without aura without status migrainosus, not intractable   CHF exacerbation (HCC)   Non-rheumatic mitral regurgitation   Discharge disposition:  Home.  Discharge Condition: Improved.  Diet recommendation: Low sodium, heart healthy  Wound care: None.   History of Present Illness:   Katie [name] is a 71 y.o. female with medical history significant of hypertension, hyperlipidemia, stroke, GERD, hypothyroidism, pulmonary hypertension, loop recorder placement, kidney cancer (d/p of right nephrectomy 2017), CKD-3, dCHF, who presents with chest pain, shortness breath and worsening leg edema.  Patient states that she has been having intermittent chest pain in the past 2 days. The chest pain is located in the frontal chest, pressure-like, nonradiating, 5 out of 10 in severity. It happens every 10-15 minutes, each time lasted for a few seconds, then resolves spontaneously. Patient also has intermittent mild shortness of breath, but no cough, fever or chills. She states that she has worsening leg edema in the past 7-10 days. She has gained approximately 3-4 pounds recently. Patient states that she increased her Lasix dose from 20 to 40 mg daily since yesterday, but no significant help. Patient denies nausea, vomiting, diarrhea, abdominal pain, symptoms of UTI, or unilateral weakness.    Hospital Course by Problem:     Acute on chronic diastolic CHF (congestive heart failure) ([place]):  -IV lasix- strict I/Os -echo shows: mod-severe MR, EF 50% and severe LVH -continue to diurese with increased dose of lasix at home -replete K  Chest pain:  -flat CE enzymes -seen by cards: pain from fluid overload  CKD (chronic kidney disease), stage III ([place]):  -watch BMP since lasix dose increased  HLD: -Pravastatin  Hx of Cerebellar stroke ([place]): -Continue aspirin, Plavix and pravastatin  Hypertension: -Metoprolol  Chronic migraine without aura without status migrainosus, not intractable: -prn Floricet Depakote d/c'd by Leonie Man but will resume as headaches coming back -outpatient OSA study as this may be the cause  Hypothyroidism:  -Continue home Synthroid    Medical Consultants:    cards   Discharge Exam:   Vitals:   05/11/17 0626 05/11/17 1142  BP: 116/83 119/75  Pulse: 73 69  Resp: 18 20  Temp: 98.3 F (36.8 C) 97.7 F (36.5 C)  SpO2: 97% 99%   Vitals:   05/10/17 1157 05/10/17 2047 05/11/17 0626 05/11/17 1142  BP: 116/74 111/70 116/83 119/75  Pulse: 72 66 73 69  Resp: 18 18 18 20   Temp: 97.6 F (36.4 C) (!) 97.3 F (36.3 C) 98.3 F (36.8 C) 97.7 F (36.5 C)  TempSrc: Oral Oral Oral Oral  SpO2: 99% 98% 97% 99%  Weight:   71.4 kg (157 lb 6.4 oz)   Height:        Gen:  NAD   The results of significant diagnostics from this hospitalization (including imaging, microbiology, ancillary and laboratory) are listed below for reference.     Procedures and Diagnostic Studies:  Dg Chest 2 View  Result Date: 05/08/2017 CLINICAL DATA:  Chest pain EXAM: CHEST  2 VIEW COMPARISON:  02/14/2017 chest radiograph. FINDINGS: Mild-to-moderate cardiomegaly, increased. Otherwise normal mediastinal contour. Loop recorder overlies the left lower chest. No pneumothorax. No pleural effusion. Hyperinflated lungs. No overt pulmonary edema. Mild bibasilar scarring versus atelectasis. No acute  consolidative airspace disease. Surgical clips are noted in the right upper abdomen. IMPRESSION: 1. Worsened cardiomegaly without overt pulmonary edema. No pleural effusions. 2. Hyperinflated lungs, suggesting obstructive lung disease. Mild bibasilar scarring versus atelectasis. Electronically Signed   By: Ilona Sorrel M.D.   On: 05/08/2017 20:05     Labs:   Basic Metabolic Panel:  Recent Labs Lab 05/08/17 2143  05/09/17 0328 05/10/17 0525 05/11/17 0618  NA 140  --  142 141 142  K 5.8*  < > 3.9 3.2* 4.7  CL 107  --  110 106 103  CO2 23  --  21* 26 30  GLUCOSE 91  --  85 112* 85  BUN 25*  --  22* 20 20  CREATININE 1.66*  --  1.48* 1.49* 1.52*  CALCIUM 9.0  --  9.0 9.0 9.1  MG  --   --   --   --  2.1  < > = values in this interval not displayed. GFR Estimated Creatinine Clearance: 32.2 mL/min (A) (by C-G formula based on SCr of 1.52 mg/dL (H)). Liver Function Tests:  Recent Labs Lab 05/08/17 2143  AST 56*  ALT 55*  ALKPHOS 136*  BILITOT 1.7*  PROT 6.5  ALBUMIN 3.6   No results for input(s): LIPASE, AMYLASE in the last 168 hours. No results for input(s): AMMONIA in the last 168 hours. Coagulation profile No results for input(s): INR, PROTIME in the last 168 hours.  CBC:  Recent Labs Lab 05/08/17 2000  WBC 3.8*  HGB 12.4  HCT 38.9  MCV 95.3  PLT 218   Cardiac Enzymes:  Recent Labs Lab 05/08/17 2143 05/09/17 0328 05/09/17 0931  TROPONINI 0.05* 0.06* 0.06*   BNP: Invalid input(s): POCBNP CBG: No results for input(s): GLUCAP in the last 168 hours. D-Dimer No results for input(s): DDIMER in the last 72 hours. Hgb A1c  Recent Labs  05/09/17 0328  HGBA1C 5.2   Lipid Profile  Recent Labs  05/09/17 0328  CHOL 105  HDL 39*  LDLCALC 56  TRIG 48  CHOLHDL 2.7   Thyroid function studies No results for input(s): TSH, T4TOTAL, T3FREE, THYROIDAB in the last 72 hours.  Invalid input(s): FREET3 Anemia work up No results for input(s): VITAMINB12,  FOLATE, FERRITIN, TIBC, IRON, RETICCTPCT in the last 72 hours. Microbiology No results found for this or any previous visit (from the past 240 hour(s)).   Discharge Instructions:   Discharge Instructions    (HEART FAILURE PATIENTS) Call MD:  Anytime you have any of the following symptoms: 1) 3 pound weight gain in 24 hours or 5 pounds in 1 week 2) shortness of breath, with or without a dry hacking cough 3) swelling in the hands, feet or stomach 4) if you have to sleep on extra pillows at night in order to breathe.    Complete by:  As directed    Diet - low sodium heart healthy    Complete by:  As directed    Increase activity slowly    Complete by:  As directed      Allergies as of 05/11/2017      Reactions   Tramadol Shortness Of  Breath, Palpitations, Other (See Comments)      Medication List    STOP taking these medications   pantoprazole 40 MG tablet Commonly known as:  PROTONIX     TAKE these medications   acetaminophen 500 MG tablet Commonly known as:  TYLENOL Take 1,000 mg by mouth every 6 (six) hours as needed for headache (pain).   aspirin 325 MG tablet Take 1 tablet (325 mg total) by mouth daily.   butalbital-acetaminophen-caffeine 50-325-40 MG tablet Commonly known as:  FIORICET, ESGIC Take 1 tablet by mouth every 12 (twelve) hours as needed for headache.   clopidogrel 75 MG tablet Commonly known as:  PLAVIX Take 1 tablet (75 mg total) by mouth daily.   divalproex 500 MG DR tablet Commonly known as:  DEPAKOTE Take 1 tablet (500 mg total) by mouth at bedtime.   feeding supplement (ENSURE ENLIVE) Liqd Take 237 mLs by mouth 2 (two) times daily between meals.   furosemide 40 MG tablet Commonly known as:  LASIX Take 1 tablet (40 mg total) by mouth daily. What changed:  medication strength  how much to take  when to take this  additional instructions   levothyroxine 25 MCG tablet Commonly known as:  SYNTHROID, LEVOTHROID Take 25 mcg by mouth daily  before breakfast.   metoprolol tartrate 25 MG tablet Commonly known as:  LOPRESSOR Take 0.5 tablets (12.5 mg total) by mouth 2 (two) times daily.   potassium chloride 10 MEQ CR capsule Commonly known as:  MICRO-K Take 2 capsules (20 mEq total) by mouth daily.   pravastatin 80 MG tablet Commonly known as:  PRAVACHOL Take 80 mg by mouth at bedtime.      Follow-up Information    Seward Carol, MD Follow up in 1 week(s).   Specialty:  Internal Medicine Why:  BMP re: Cr and K Contact information: [address] [place] [address] [phone]            Time coordinating discharge: 35 min  Signed:  Dylana Shaw Alison Stalling   [facility] 05/11/2017, 11:43 AM

## 2017-05-11 NOTE — Progress Notes (Signed)
Progress Note  Patient Name: Katie [name] Date of Encounter: 05/11/2017  Primary Cardiologist: Tyjanae Bartek   Subjective    71 year old female with a history of hypertension, hyperlipidemia, stroke, diastolic congestive heart failure and at least moderate to severe mitral regurgitation. She was admitted with chest pain and volume overload.  She was changed from IV lasix to PO lasix today  Feels well No dyspnea   Inpatient Medications    Scheduled Meds: . aspirin  325 mg Oral Daily  . clopidogrel  75 mg Oral Daily  . divalproex  500 mg Oral QHS  . enoxaparin (LOVENOX) injection  40 mg Subcutaneous Q24H  . furosemide  40 mg Oral Daily  . levothyroxine  25 mcg Oral QAC breakfast  . metoprolol tartrate  12.5 mg Oral BID  . pravastatin  80 mg Oral QHS  . sodium chloride flush  3 mL Intravenous Q12H   Continuous Infusions: . sodium chloride     PRN Meds: sodium chloride, acetaminophen, butalbital-acetaminophen-caffeine, hydrALAZINE, nitroGLYCERIN, ondansetron (ZOFRAN) IV, senna-docusate, sodium chloride flush, zolpidem   Vital Signs    Vitals:   05/10/17 0423 05/10/17 1157 05/10/17 2047 05/11/17 0626  BP: 130/85 116/74 111/70 116/83  Pulse: 80 72 66 73  Resp: 18 18 18 18   Temp: (!) 97.5 F (36.4 C) 97.6 F (36.4 C) (!) 97.3 F (36.3 C) 98.3 F (36.8 C)  TempSrc: Oral Oral Oral Oral  SpO2: 100% 99% 98% 97%  Weight: 158 lb 3.2 oz (71.8 kg)   157 lb 6.4 oz (71.4 kg)  Height:        Intake/Output Summary (Last 24 hours) at 05/11/17 1106 Last data filed at 05/11/17 1104  Gross per 24 hour  Intake              950 ml  Output             1220 ml  Net             -270 ml   Filed Weights   05/08/17 2324 05/10/17 0423 05/11/17 0626  Weight: 164 lb 9.6 oz (74.7 kg) 158 lb 3.2 oz (71.8 kg) 157 lb 6.4 oz (71.4 kg)    Telemetry    NSR - Personally Reviewed  ECG     NSR  - Personally Reviewed  Physical Exam   GEN: No acute distress.  Able to lie flat without any  problem s Neck: No JVD Cardiac: RR, soft systolic murmur , rubs, or gallops.  Respiratory:  few scattered rales.  GI: Soft, nontender, non-distended  MS: No edema; No deformity. Neuro:  Nonfocal  Psych: Normal affect   Labs    Chemistry Recent Labs Lab 05/08/17 2143  05/09/17 0328 05/10/17 0525 05/11/17 0618  NA 140  --  142 141 142  K 5.8*  < > 3.9 3.2* 4.7  CL 107  --  110 106 103  CO2 23  --  21* 26 30  GLUCOSE 91  --  85 112* 85  BUN 25*  --  22* 20 20  CREATININE 1.66*  --  1.48* 1.49* 1.52*  CALCIUM 9.0  --  9.0 9.0 9.1  PROT 6.5  --   --   --   --   ALBUMIN 3.6  --   --   --   --   AST 56*  --   --   --   --   ALT 55*  --   --   --   --  ALKPHOS 136*  --   --   --   --   BILITOT 1.7*  --   --   --   --   GFRNONAA 30*  --  34* 34* 33*  GFRAA 35*  --  40* 40* 39*  ANIONGAP 10  --  11 9 9   < > = values in this interval not displayed.   Hematology Recent Labs Lab 05/08/17 2000  WBC 3.8*  RBC 4.08  HGB 12.4  HCT 38.9  MCV 95.3  MCH 30.4  MCHC 31.9  RDW 14.2  PLT 218    Cardiac Enzymes Recent Labs Lab 05/08/17 2143 05/09/17 0328 05/09/17 0931  TROPONINI 0.05* 0.06* 0.06*    Recent Labs Lab 05/08/17 2005  TROPIPOC 0.06     BNP Recent Labs Lab 05/08/17 2000  BNP 740.6*     DDimer No results for input(s): DDIMER in the last 168 hours.   Radiology    No results found.  Cardiac Studies      Patient Profile     71 y.o. female with chronic diastolic CHF and mod - severe MR   Assessment & Plan    1.   Acute on chronic diastolic congestive heart failure: The patient seems to be doing well. She's able to lie flat without any significant problems. She did not diurese quite as much yesterday she did on previous days.  She's on Lasix 40 mg a day which is twice her normal home dose. I think that she can probably be discharged soon-either later today or  Tomorrow.   For questions or updates, please contact [place] Please consult  [email or web address] for contact info under Cardiology/STEMI.      Signed, [name], MD  05/11/2017, 11:06 AM

## 2017-05-11 NOTE — Progress Notes (Signed)
Orders received for pt discharge.  Discharge summary printed and reviewed with pt.  Explained medication regimen, and pt had no further questions at this time.  IV removed and site remains clean, dry, intact.  Telemetry removed.  Pt in stable condition and awaiting transport. 

## 2017-05-12 ENCOUNTER — Other Ambulatory Visit: Payer: Self-pay | Admitting: *Deleted

## 2017-05-12 NOTE — Patient Outreach (Signed)
[facility]) Care Management   05/12/2017  COREAN YOSHIMURA 1946-06-04 [id number]  ADDIE ALONGE is an 71 y.o. female  Subjective:   Member alert and oriented x3.  Denies pain or discomfort, report compliance with medications.  She state she has been attempting to adhere to her low sodium diet, but request more information regarding appropriate foods to eat. Report she has follow up appointments with primary MD, cardiologist, and heart failure clinic within the next several weeks.  Objective:   Review of Systems  Constitutional: Negative.   HENT: Negative.   Eyes: Negative.   Respiratory: Negative.   Cardiovascular: Negative.   Gastrointestinal: Negative.   Genitourinary: Negative.   Musculoskeletal: Negative.   Skin: Negative.   Neurological: Negative.   Endo/Heme/Allergies: Negative.   Psychiatric/Behavioral: Negative.     Physical Exam  Constitutional: She is oriented to person, place, and time. She appears well-developed and well-nourished.  Neck: Normal range of motion.  Cardiovascular: Normal rate, regular rhythm and normal heart sounds.   Respiratory: Effort normal and breath sounds normal.  GI: Soft. Bowel sounds are normal.  Musculoskeletal: Normal range of motion.  Neurological: She is alert and oriented to person, place, and time.  Skin: Skin is warm and dry.   BP 102/64 (BP Location: Left Arm, Patient Position: Sitting, Cuff Size: Normal)   Pulse 69   Resp 18   Ht 1.6 m ('5\' 3"'$ )   Wt 157 lb (71.2 kg)   LMP 02/12/2013   SpO2 97%   BMI 27.81 kg/m   Encounter Medications:   Outpatient Encounter Prescriptions as of 05/12/2017  Medication Sig Note  . acetaminophen (TYLENOL) 500 MG tablet Take 1,000 mg by mouth every 6 (six) hours as needed for headache (pain).   Marland Kitchen aspirin 325 MG tablet Take 1 tablet (325 mg total) by mouth daily.   . butalbital-acetaminophen-caffeine (FIORICET, ESGIC) 50-325-40 MG tablet Take 1 tablet by mouth every 12 (twelve)  hours as needed for headache.   . clopidogrel (PLAVIX) 75 MG tablet Take 1 tablet (75 mg total) by mouth daily.   . divalproex (DEPAKOTE) 500 MG DR tablet Take 1 tablet (500 mg total) by mouth at bedtime.   . furosemide (LASIX) 40 MG tablet Take 1 tablet (40 mg total) by mouth daily.   Marland Kitchen levothyroxine (SYNTHROID, LEVOTHROID) 25 MCG tablet Take 25 mcg by mouth daily before breakfast. 05/08/2017: New medication 04/25/17 - pt took for about 4 days but then stopped because it was making her nauseous - waiting for further instructions from MD  . metoprolol tartrate (LOPRESSOR) 25 MG tablet Take 0.5 tablets (12.5 mg total) by mouth 2 (two) times daily.   . potassium chloride (MICRO-K) 10 MEQ CR capsule Take 2 capsules (20 mEq total) by mouth daily.   . pravastatin (PRAVACHOL) 80 MG tablet Take 80 mg by mouth at bedtime.    . feeding supplement, ENSURE ENLIVE, (ENSURE ENLIVE) LIQD Take 237 mLs by mouth 2 (two) times daily between meals. (Patient not taking: Reported on 05/12/2017)    No facility-administered encounter medications on file as of 05/12/2017.     Functional Status:   In your present state of health, do you have any difficulty performing the following activities: 05/08/2017 03/03/2017  Hearing? N N  Vision? N Y  Difficulty concentrating or making decisions? N N  Walking or climbing stairs? N N  Dressing or bathing? N N  Doing errands, shopping? N N  Preparing Food and eating ? - N  Using the Toilet? - N  In the past six months, have you accidently leaked urine? - N  Do you have problems with loss of bowel control? - N  Managing your Medications? - N  Managing your Finances? - N  Housekeeping or managing your Housekeeping? - N  Some recent data might be hidden    Fall/Depression Screening:    Fall Risk  04/25/2017 03/03/2017  Falls in the past year? No Yes  Number falls in past yr: - 1  Injury with Fall? - No  Risk for fall due to : - History of fall(s);Impaired balance/gait;Impaired  mobility;Impaired vision;Medication side effect  Follow up - Education provided;Falls prevention discussed;Follow up appointment   PHQ 2/9 Scores 03/03/2017 02/22/2017  PHQ - 2 Score 0 0    Assessment:    Met with member at scheduled time.  Patient was recently discharged from hospital and all medications have been reviewed.  She does not use pill box, prefer to take individually out of the bottles.    She verbalizes understanding regarding plan for management of mitral regurgitation.  She will not have surgery at this time, but will begin medical management at the heart failure clinic.  She denies questions regarding plan of care.  She continues to weigh herself and check her blood pressure daily, recording on log in [facility] calendar tool book.  Encouraged to continue.  Denies any urgent concerns at this time, advised to contact with questions.  Plan:   Will mail member education on low salt diet. Will follow up with weekly transition of care call next week.  THN CM Care Plan Problem One     Most Recent Value  Care Plan Problem One  Risk for readmission related to heart failure as evidenced by recent hospitalization.    Role Documenting the Problem One  Care Management [place] for Problem One  Active  THN Long Term Goal   Member will not be admitted to hospital within the next 31 days  THN Long Term Goal Start Date  05/12/17  Interventions for Problem One Long Term Goal  Discussed with member the importance of following discharge instructions, including follow up appointments, medications, diet, to decrease the risk of readmission  THN CM Short Term Goal #1   Member will keep and attend follow up appointments with primary MD, cardiologist, and heart failure clinic within the next 4 weeks  THN CM Short Term Goal #1 Start Date  05/12/17  Interventions for Short Term Goal #1  Confirmed member has follow up appointments, reminded of dates/times.  Advised of importance of keeping  appointments.  Confirmed member has transprotation.  THN CM Short Term Goal #2   Member will report compliance with medications within the next 4 weeks  THN CM Short Term Goal #2 Start Date  05/12/17  Interventions for Short Term Goal #2  Medications reviewd in the home.  Educated on newly prescribed ones as well as importance of taking as prescribed.    THN CM Care Plan Problem Two     Most Recent Value  Care Plan Problem Two  Knowledge deficit regarding mitral regurgitation as evidenced by member's stated lack of knowledge  Role Documenting the Problem Two  Care Management [place] for Problem Two  Active  Interventions for Problem Two Long Term Goal   Educated member on complications of mitral regurgitation as well as possible treatment options.  THN Long Term Goal  Member will be able to  state plan for mitral regurgitation treatment within the next 45 days  THN Long Term Goal Start Date  04/14/17  [facility] Long Term Goal Met Date  05/12/17  [facility] CM Short Term Goal #1   Member will keep and attend appointment with cardiac surgeon within the next 2 weeks  THN CM Short Term Goal #1 Start Date  04/14/17  [facility] CM Short Term Goal #1 Met Date   05/12/17  Interventions for Short Term Goal #2   Member edcuated on the purpose and importance of keeping appoirntment with surgeon in effort to establish plan of care.  THN CM Short Term Goal #2   Member will report adherence to low sodium diet over the next 4 weeks  THN CM Short Term Goal #2 Start Date  04/14/17  [name] CM Short Term Goal #2 Met Date  -- [Not met]  Interventions for Short Term Goal #2  Member educated on low sodium diet.  Advised of importance of following diet in effort to control fluid status.     [name], [place], MSN [place] [phone]

## 2017-05-16 ENCOUNTER — Other Ambulatory Visit: Payer: Self-pay | Admitting: *Deleted
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
## 2017-05-19 ENCOUNTER — Ambulatory Visit (INDEPENDENT_AMBULATORY_CARE_PROVIDER_SITE_OTHER): Payer: Medicare Other | Admitting: *Deleted

## 2017-05-19 ENCOUNTER — Other Ambulatory Visit: Payer: Self-pay | Admitting: *Deleted

## 2017-05-19 DIAGNOSIS — I639 Cerebral infarction, unspecified: Secondary | ICD-10-CM

## 2017-05-19 NOTE — Patient Outreach (Signed)
[place] [facility]) Care Management  05/19/2017  Katie [name] 20-Jul-1946 [id number]   Notified that member triggered red on EMMI dashboard, answering "yes" for "lost interest in things they used to enjoy."  Call placed to member, she report she is doing "alright."  She report that this is one of the best days she has had since being discharged.  She denies any pain or discomfort, denies shortness of breath.  This care manager inquired about member's mood and lost of interest, she state she has NOT lost interest in things.  State she may have answered the recording wrong.  Denies concerns at this time, will follow up next week.  [name], [place], MSN [place] [phone]

## 2017-05-19 NOTE — Patient Outreach (Signed)
Patient triggered RED on EMMI Dashboard, notification sent to:  [name], RN

## 2017-05-20 LAB — CUP PACEART REMOTE DEVICE CHECK
Date Time Interrogation Session: 20181018184051
Implantable Pulse Generator Implant Date: 20180720

## 2017-05-20 NOTE — Progress Notes (Signed)
Carelink Summary Report / Loop Recorder 

## 2017-05-25 ENCOUNTER — Other Ambulatory Visit: Payer: Self-pay | Admitting: *Deleted

## 2017-05-25 NOTE — Patient Outreach (Signed)
[facility]) Care Management  05/25/2017  KHADEEJAH CASTNER 06/17/1946 [id number]   Weekly transition of care call placed to member.  Notified that member triggered red on EMMI dashboard for not weighing yesterday.  Member report she is doing well, state she is weighing daily.  Report yesterday's weight of 154 pounds, denies shortness of breath.  State she is compliant with medications, looking forward to MD visit with cardiology tomorrow.  Report she continues to learn about low sodium diet, report being in the green zone today.  Denies concerns at this time, will follow up with weekly transition of care next week.  [name], [place], MSN [place] [phone]

## 2017-05-26 ENCOUNTER — Encounter: Payer: Self-pay | Admitting: Cardiovascular Disease

## 2017-05-26 ENCOUNTER — Ambulatory Visit (INDEPENDENT_AMBULATORY_CARE_PROVIDER_SITE_OTHER): Payer: Medicare Other | Admitting: Cardiovascular Disease

## 2017-05-26 VITALS — BP 122/86 | HR 87 | Ht 63.0 in | Wt 156.0 lb

## 2017-05-26 DIAGNOSIS — I5032 Chronic diastolic (congestive) heart failure: Secondary | ICD-10-CM

## 2017-05-26 LAB — BASIC METABOLIC PANEL
BUN/Creatinine Ratio: 17 (ref 12–28)
BUN: 21 mg/dL (ref 8–27)
CO2: 23 mmol/L (ref 20–29)
Calcium: 9.8 mg/dL (ref 8.7–10.3)
Chloride: 107 mmol/L — ABNORMAL HIGH (ref 96–106)
Creatinine, Ser: 1.27 mg/dL — ABNORMAL HIGH (ref 0.57–1.00)
GFR calc Af Amer: 49 mL/min/{1.73_m2} — ABNORMAL LOW (ref >59)
GFR calc non Af Amer: 42 mL/min/{1.73_m2} — ABNORMAL LOW (ref >59)
Glucose: 87 mg/dL (ref 65–99)
Potassium: 4.4 mmol/L (ref 3.5–5.2)
Sodium: 142 mmol/L (ref 134–144)

## 2017-05-26 MED ORDER — FUROSEMIDE 40 MG PO TABS: 40.0000 mg | ORAL_TABLET | Freq: Every day | ORAL | 3 refills | Status: DC

## 2017-05-26 MED ORDER — METOPROLOL TARTRATE 25 MG PO TABS: 12.5000 mg | ORAL_TABLET | Freq: Two times a day (BID) | ORAL | 3 refills | Status: DC

## 2017-05-26 NOTE — Patient Instructions (Signed)
Medication Instructions:  Your physician recommends that you continue on your current medications as directed. Please refer to the Current Medication list given to you today.   Labwork: TODAY - basic metabolic panel   Testing/Procedures: None Ordered   Follow-Up: Your physician wants you to follow-up in: 6 months with Dr. Nahser. You will receive a reminder letter in the mail two months in advance. If you don't receive a letter, please call our office to schedule the follow-up appointment.   If you need a refill on your cardiac medications before your next appointment, please call your pharmacy.   Thank you for choosing [facility]! Rochele Lueck, RN [phone]    

## 2017-05-26 NOTE — Progress Notes (Signed)
05/26/2017 Katie [name]   1946/07/27  [id number]  Primary Physician Seward Carol, MD Primary Cardiologist: Dr. Acie Fredrickson  Problem list 1. Chronic diastolic congestive heart failure 2. Chronic kidney disease stage III 3. Essential hypertension 4. Hyperlipidemia 5.  CVA   Reason for Visit/CC: [facility] f/u for Diastolic CHF  Notes from [facility],   Katie [name] is a 71 year old female with a past medical history of renal CA, CKD stage III, HTN and HLD. She presented to the ED on 01/07/16 with progressive SOB with exertion, orthopnea, and dry cough. She also reported chest pain that lasted only a few seconds that was associated with exertion as well. D -dimer elevated, VQ scan showed low probability of PE. CXR showed increased interstitial opacity and new effusions. Cath showed normal cors, and elevated pulmonary artery pressures of 23 mmHg. Echo showed normal LVEF of 60-65%, grade 3 DD,  Moderate MR, with a reversible restrictive pattern indicative of decreased LV diastolic compliance. 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 She was treated with IV diuretics for acute on chronic diastolic CHF. She was diuresed down to a dry weight of 175 lb (admit weight was 181 lb). She was discharged home on PO lasix, ASA, metoprolol and pravastatin.   She presents to clinic today for post hospital f/u. She reports that she has done well. No issues. She denies recurrent dyspnea. No CP. No edema. She has been fully compliant with her meds and checking weight daily at home which has remained stable. BP is well controlled at 115/68.  Oct. 2, 2017:  Doing well. No CP or dyspnea  Has gained a litte weight.   Has some fatigue on occasion  Mach 19, 2018:  Ms Katie [name] is seen back for follow up of her chronic diastolic CHF . Had an episode of hypotension.   was seen in the ER Lasix dose was decreased from 40 a  day to 20 mg a day  Seems to be working well   Oct. 25, 2018  Seen for  Seen for a follow up visit hx Was admitted for acute diastolic CHF in Oct. 10. Her lasix was increased to 40 mg  Avoiding salt and canned foods No CP , breathing is getting better. No syncope   Scheduled to see CHF clinic in a month    Current Outpatient Prescriptions  Medication Sig Dispense Refill  . acetaminophen (TYLENOL) 500 MG tablet Take 1,000 mg by mouth every 6 (six) hours as needed for headache (pain).    Marland Kitchen aspirin 325 MG tablet Take 1 tablet (325 mg total) by mouth daily.    . butalbital-acetaminophen-caffeine (FIORICET, ESGIC) 50-325-40 MG tablet Take 1 tablet by mouth every 12 (twelve) hours as needed for headache. 14 tablet 0  . clopidogrel (PLAVIX) 75 MG tablet Take 1 tablet (75 mg total) by mouth daily. 30 tablet 1  . divalproex (DEPAKOTE) 500 MG DR tablet Take 1 tablet (500 mg total) by mouth at bedtime. 30 tablet 0  . feeding supplement, ENSURE ENLIVE, (ENSURE ENLIVE) LIQD Take 237 mLs by mouth 2 (two) times daily between meals. 237 mL 0  . furosemide (LASIX) 40 MG tablet Take 1 tablet (40 mg total) by mouth daily. 30 tablet 0  . levothyroxine (SYNTHROID, LEVOTHROID) 25 MCG tablet Take 25 mcg by mouth daily before breakfast.    . metoprolol tartrate (LOPRESSOR) 25 MG tablet  Take 0.5 tablets (12.5 mg total) by mouth 2 (two) times daily. 30 tablet 8  . potassium chloride (MICRO-K) 10 MEQ CR capsule Take 2 capsules (20 mEq total) by mouth daily. 60 capsule 11  . pravastatin (PRAVACHOL) 80 MG tablet Take 80 mg by mouth at bedtime.      No current facility-administered medications for this visit.     Allergies  Allergen Reactions  . Tramadol Shortness Of Breath, Palpitations and Other (See Comments)    Social History   Social History  . Marital status: Divorced    Spouse name: N/A  . Number of children: N/A  . Years of education: N/A   Occupational History  . Not on file.   Social  History Main Topics  . Smoking status: Never Smoker  . Smokeless tobacco: Never Used  . Alcohol use No  . Drug use: No  . Sexual activity: No   Other Topics Concern  . Not on file   Social History Narrative  . No narrative on file     Review of Systems: General: negative for chills, fever, night sweats or weight changes.  Cardiovascular: negative for chest pain, dyspnea on exertion, edema, orthopnea, palpitations, paroxysmal nocturnal dyspnea or shortness of breath Dermatological: negative for rash Respiratory: negative for cough or wheezing Urologic: negative for hematuria Abdominal: negative for nausea, vomiting, diarrhea, bright red blood per rectum, melena, or hematemesis Neurologic: negative for visual changes, syncope, or dizziness All other systems reviewed and are otherwise negative except as noted above.  Physical Exam: Blood pressure 122/86, pulse 87, height 5\' 3"  (1.6 m), weight 156 lb (70.8 kg), last menstrual period 02/12/2013, SpO2 97 %.  GEN:  Well nourished, well developed in no acute distress HEENT: Normal NECK: No JVD; No carotid bruits LYMPHATICS: No lymphadenopathy CARDIAC: RR, systolic  Murmur is barely audible, no rubs, gallops RESPIRATORY:  Clear to auscultation without rales, wheezing or rhonchi  ABDOMEN: Soft, non-tender, non-distended MUSCULOSKELETAL:  No edema; No deformity  SKIN: Warm and dry NEUROLOGIC:  Alert and oriented x 3   EKG :     ASSESSMENT AND PLAN:   1. Chronic  Diastolic  HF:    She is clearly better on Lasix 40 g a day. She's not having any symptoms. She is scheduled to see heart failure clinic in a month. I would be happy to have her followed in heart failure clinic if they would like to continue seeing her.  2. Mitral regurgitation  Has been seen by Dr. [name]. He'll see her in clinic for consideration for mitral valve repair   3. Essential hypertension: Blood pressure is well-controlled.   [name], MD  05/26/2017  8:36 AM    [place],  [place], [place]  [address] Pager [phone] Phone: [phone]; Fax: [phone]

## 2017-06-01 ENCOUNTER — Other Ambulatory Visit: Payer: Self-pay | Admitting: *Deleted

## 2017-06-01 NOTE — Patient Outreach (Addendum)
[place] [facility]) Care Management  06/01/2017  Katie [name] 08-Jan-1946 [id number]    Weekly transition of care call placed to member.  She report she is doing "fine."  This care manager inquired about current weight, state "it's still 155 pounds."  She continues to deny shortness of breath or chest discomfort.  State she has continued to be compliant with medications, denies concerns.  She is currently out, not at home, therefore conversation was short.  Will follow up next week, advised to contact with questions.   THN CM Care Plan Problem One     Most Recent Value  Care Plan Problem One  Risk for readmission related to heart failure as evidenced by recent hospitalization.    Role Documenting the Problem One  Care Management [place] for Problem One  Active  THN Long Term Goal   Member will not be admitted to hospital within the next 31 days  THN Long Term Goal Start Date  05/12/17  Interventions for Problem One Long Term Goal  Member educated on the importance of adhering to heart failure plan, including attending appointment at heart failure clinic, in effort to continue to manage heart failure and decrease risk of admission  [facility] CM Short Term Goal #1   Member will keep and attend follow up appointments with primary MD, cardiologist, and heart failure clinic within the next 4 weeks  THN CM Short Term Goal #1 Start Date  05/12/17  [facility] CM Short Term Goal #1 Met Date  06/01/17  Interventions for Short Term Goal #1  Reminded member of follow up appointment with cardiology tomorrow, reminded of dates/times.  Advised of importance of keeping appointments.  Confirmed member has transprotation.  THN CM Short Term Goal #2   Member will report compliance with medications within the next 4 weeks  THN CM Short Term Goal #2 Start Date  05/12/17  [facility] CM Short Term Goal #2 Met Date  05/25/17  Interventions for Short Term Goal #2  Re-educated on importance of taking  medications as prescribed in effort to decrease risk fo readmission      [name], RN, MSN [place] Manager [phone]

## 2017-06-06 ENCOUNTER — Other Ambulatory Visit: Payer: Self-pay | Admitting: *Deleted

## 2017-06-06 NOTE — Patient Outreach (Signed)
[facility]) Care Management  06/06/2017  TIWATOPE EMMITT 1946-06-16 [id number]   Weekly transition of care call placed to member.  She report she is doing "alright."  Denies any shortness of breath and/or legs swelling.  State she has been compliant with medications, daily weights, and increasing adherence to low sodium diet.  Report a decrease in weight of 3 pounds over the past week.  Denies urgent concerns, has new appointment with heart failure clinic next week, routine home visit also scheduled for next week.  Advised to contact this care manager with any concerns.   [facility] CM Care Plan Problem One     Most Recent Value  Care Plan Problem One  Risk for readmission related to heart failure as evidenced by recent hospitalization.    Role Documenting the Problem One  Care Management [place] for Problem One  Active  [facility]N Long Term Goal   Member will not be admitted to hospital within the next 31 days  [facility] Long Term Goal Start Date  05/12/17  Interventions for Problem One Long Term Goal  EMMI education regarding heart failure management assigned/provided (including low sodium diet and daily weights).  [facility] CM Short Term Goal #1   Member will keep and attend follow up appointments with primary MD, cardiologist, and heart failure clinic within the next 4 weeks  [facility] CM Short Term Goal #1 Start Date  05/12/17  [facility] CM Short Term Goal #1 Met Date  06/01/17  Interventions for Short Term Goal #1  Reminded member of follow up appointment with cardiology tomorrow, reminded of dates/times.  Advised of importance of keeping appointments.  Confirmed member has transprotation.  [facility] CM Short Term Goal #2   Member will report compliance with medications within the next 4 weeks  [facility] CM Short Term Goal #2 Start Date  05/12/17  [facility] CM Short Term Goal #2 Met Date  05/25/17  Interventions for Short Term Goal #2  Re-educated on importance of taking medications as prescribed in effort to  decrease risk fo readmission      [name], RN, MSN [place] Manager [phone]

## 2017-06-14 ENCOUNTER — Other Ambulatory Visit: Payer: Self-pay

## 2017-06-14 ENCOUNTER — Encounter (HOSPITAL_COMMUNITY): Payer: Self-pay | Admitting: Cardiology

## 2017-06-14 ENCOUNTER — Ambulatory Visit (HOSPITAL_COMMUNITY)
Admission: RE | Admit: 2017-06-14 | Discharge: 2017-06-14 | Disposition: A | Discharge status: Home / Self Care | Payer: Medicare Other | Source: Ambulatory Visit | Attending: Cardiology | Admitting: Cardiology

## 2017-06-14 VITALS — BP 131/81 | HR 83 | Wt 154.5 lb

## 2017-06-14 DIAGNOSIS — Z905 Acquired absence of kidney: Secondary | ICD-10-CM | POA: Diagnosis not present

## 2017-06-14 DIAGNOSIS — I130 Hypertensive heart and chronic kidney disease with heart failure and stage 1 through stage 4 chronic kidney disease, or unspecified chronic kidney disease: Secondary | ICD-10-CM | POA: Insufficient documentation

## 2017-06-14 DIAGNOSIS — Z79899 Other long term (current) drug therapy: Secondary | ICD-10-CM | POA: Insufficient documentation

## 2017-06-14 DIAGNOSIS — I340 Nonrheumatic mitral (valve) insufficiency: Secondary | ICD-10-CM | POA: Diagnosis not present

## 2017-06-14 DIAGNOSIS — E039 Hypothyroidism, unspecified: Secondary | ICD-10-CM | POA: Diagnosis not present

## 2017-06-14 DIAGNOSIS — I5032 Chronic diastolic (congestive) heart failure: Secondary | ICD-10-CM | POA: Diagnosis present

## 2017-06-14 DIAGNOSIS — Z7902 Long term (current) use of antithrombotics/antiplatelets: Secondary | ICD-10-CM | POA: Insufficient documentation

## 2017-06-14 DIAGNOSIS — N183 Chronic kidney disease, stage 3 unspecified: Secondary | ICD-10-CM

## 2017-06-14 DIAGNOSIS — E859 Amyloidosis, unspecified: Secondary | ICD-10-CM | POA: Diagnosis not present

## 2017-06-14 DIAGNOSIS — Z833 Family history of diabetes mellitus: Secondary | ICD-10-CM | POA: Diagnosis not present

## 2017-06-14 DIAGNOSIS — I639 Cerebral infarction, unspecified: Secondary | ICD-10-CM

## 2017-06-14 DIAGNOSIS — Z85528 Personal history of other malignant neoplasm of kidney: Secondary | ICD-10-CM | POA: Diagnosis not present

## 2017-06-14 DIAGNOSIS — Z7982 Long term (current) use of aspirin: Secondary | ICD-10-CM | POA: Diagnosis not present

## 2017-06-14 DIAGNOSIS — E785 Hyperlipidemia, unspecified: Secondary | ICD-10-CM | POA: Insufficient documentation

## 2017-06-14 DIAGNOSIS — Z8673 Personal history of transient ischemic attack (TIA), and cerebral infarction without residual deficits: Secondary | ICD-10-CM | POA: Insufficient documentation

## 2017-06-14 LAB — BASIC METABOLIC PANEL
Anion gap: 7 (ref 5–15)
BUN: 20 mg/dL (ref 6–20)
CO2: 28 mmol/L (ref 22–32)
Calcium: 9.8 mg/dL (ref 8.9–10.3)
Chloride: 107 mmol/L (ref 101–111)
Creatinine, Ser: 1.38 mg/dL — ABNORMAL HIGH (ref 0.44–1.00)
GFR calc Af Amer: 43 mL/min — ABNORMAL LOW (ref >60)
GFR calc non Af Amer: 37 mL/min — ABNORMAL LOW (ref >60)
Glucose, Bld: 81 mg/dL (ref 65–99)
Potassium: 4.6 mmol/L (ref 3.5–5.1)
Sodium: 142 mmol/L (ref 135–145)

## 2017-06-14 LAB — BRAIN NATRIURETIC PEPTIDE: B Natriuretic Peptide: 564 pg/mL — ABNORMAL HIGH (ref 0.0–100.0)

## 2017-06-14 MED ORDER — ASPIRIN 81 MG PO TABS: 81.0000 mg | ORAL_TABLET | Freq: Every day | ORAL | Status: AC

## 2017-06-14 MED ORDER — FUROSEMIDE 40 MG PO TABS: ORAL_TABLET | ORAL | 3 refills | Status: DC

## 2017-06-14 MED ORDER — POTASSIUM CHLORIDE ER 10 MEQ PO CPCR
ORAL_CAPSULE | ORAL | 11 refills | Status: DC
Start: 2017-06-14 — End: 2017-09-11

## 2017-06-14 NOTE — Progress Notes (Signed)
PCP: Dr. [name] HF Cardiology: Dr. [name]  71 yo with history of chronic diastolic CHF, mitral regurgitation, CVA, and CKD was referred by Dr. Acie Fredrickson for evaluation of CHF.   Patient was initially admitted with diastolic CHF in 1/61. Echo showed EF 60-65% with grade 3 diastolic dysfunction and moderate MR.  She had cath showing no coronary disease. 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  TEE this admission showed moderate-severe MR.  She was admitted again in 10/18 with acute on chronic diastolic CHF. Echo this admission showed EF 50-55%, severe LVH, restrictive diastolic dysfunction, severe MR, moderate-severe TR.  She was referred to Dr. [name] who saw her recently for mitral valve evaluation.  Upon review of studies, regurgitation thought to be more in the moderate range and likely functional.   She is stable symptomatically. She gets short of breath walking up a hill or incline but no dyspnea walking short distances on flat ground.  She has to rest after walking about 1/2 block.  No orthopnea/PND.  No lightheadedness/syncope. No chest pain.    ECG (10/18, personally reviewed): NSR, low voltage in limb leads, nonspecific T wave flattening.   Labs (10/18): K 4.4, creatinine 1.27, LDL 56, HDL 39  PMH; 1. HTN 2. Hyperlipidemia 3. 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  LINQ monitor placed.  4. Chronic diastolic CHF:  - LHC (0/96): Normal coronaries.  - Echo (10/18): EF 50-55%, severe LVH with speckled myocardium concerning for amyloidosis, restrictive diastolic function with septal flattening, severe MR, moderate-severe TR, severe biatrial enlargement, PASP 54 mmHg.  5. Mitral regurgitation: Probably functional.  - TEE (7/18): EF 45-50%, moderate-severe MR, mild-moderate TR.  - Echo (10/18) read as severe MR.  6. CKD stage 3 7. Hypothyroidism 8. H/o renal cell CA s/p nephrectomy.   Social History   Socioeconomic History  . Marital status: Divorced   Spouse name: Not on file  . Number of children: Not on file  . Years of education: Not on file  . Highest education level: Not on file  Social Needs  . Financial resource strain: Not on file  . Food insecurity - worry: Not on file  . Food insecurity - inability: Not on file  . Transportation needs - medical: Not on file  . Transportation needs - non-medical: Not on file  Occupational History  . Not on file  Tobacco Use  . Smoking status: Never Smoker  . Smokeless tobacco: Never Used  Substance and Sexual Activity  . Alcohol use: No  . Drug use: No  . Sexual activity: No  Other Topics Concern  . Not on file  Social History Narrative  . Not on file   Family History  Problem Relation Age of Onset  . Stroke Brother   . Diabetes Mellitus II Sister    ROS: All systems reviewed and negative except as per HPI.   Current Outpatient Medications  Medication Sig Dispense Refill  . acetaminophen (TYLENOL) 500 MG tablet Take 1,000 mg by mouth every 6 (six) hours as needed for headache (pain).    Marland Kitchen aspirin 81 MG tablet Take 1 tablet (81 mg total) daily by mouth.    . butalbital-acetaminophen-caffeine (FIORICET, ESGIC) 50-325-40 MG tablet Take 1 tablet by mouth every 12 (twelve) hours as needed for headache. 14 tablet 0  . clopidogrel (PLAVIX) 75 MG tablet Take 1 tablet (75 mg total) by mouth daily. 30 tablet 1  . divalproex (DEPAKOTE) 500 MG DR tablet Take  1 tablet (500 mg total) by mouth at bedtime. 30 tablet 0  . feeding supplement, ENSURE ENLIVE, (ENSURE ENLIVE) LIQD Take 237 mLs by mouth 2 (two) times daily between meals. 237 mL 0  . furosemide (LASIX) 40 MG tablet 1 tab in AM, then 0.5 tab in PM 90 tablet 3  . levothyroxine (SYNTHROID, LEVOTHROID) 25 MCG tablet Take 25 mcg by mouth daily before breakfast.    . metoprolol tartrate (LOPRESSOR) 25 MG tablet Take 0.5 tablets (12.5 mg total) by mouth 2 (two) times daily. 90 tablet 3  . potassium chloride (MICRO-K) 10 MEQ CR capsule 1 tab  in AM , and 0.5 tab in PM 45 capsule 11  . pravastatin (PRAVACHOL) 80 MG tablet Take 80 mg by mouth at bedtime.      No current facility-administered medications for this encounter.    BP 131/81   Pulse 83   Wt 154 lb 8 oz (70.1 kg)   LMP 02/12/2013   SpO2 100%   BMI 27.37 kg/m  General: NAD Neck: JVP 10-11 cm with HJR, no thyromegaly or thyroid nodule.  Lungs: Clear to auscultation bilaterally with normal respiratory effort. CV: Nondisplaced PMI.  Heart regular S1/S2, no S3/S4, 1/6 HSM apex.  Trace ankle edema.  No carotid bruit.  Normal pedal pulses.  Abdomen: Soft, nontender, no hepatosplenomegaly, no distention.  Skin: Intact without lesions or rashes.  Neurologic: Alert and oriented x 3.  Psych: Normal affect. Extremities: No clubbing or cyanosis.  HEENT: Normal.   Assessment/Plan: 1. Chronic diastolic CHF: Normal EF on 10/18 echo with severe LVH and concern for speckled myocardium possibly consistent with cardiac amyloidosis.  On exam, she is volume overloaded and symptoms are NYHA class III.   - Check SPEP/UPEP. - PYP amyloidosis scan to assess for transthyretin amyloid.  - Cardiac MRI to assess for cardiac amyloidosis.  - Increase Lasix to 40 mg bid x 2 days then 40 in the am and 20 in the pm.  BMET/BNP today and repeat BMET 10 days.  - She should qualify for Cardiomems device.  We will work on getting this set up for her.  2. Mitral regurgitation: Review of most recent TTE and also TEE suggest that the MR is more in the moderate range and likely functional.  No surgery for now, continue medical management of CHF and follow MR over time.  3. CKD: Stage III.  Follow closely with diuresis.  4. CVA: 7/18. Has LINQ monitor, no atrial fibrillation detected so far.  - Continue ASA but can decrease to 81 mg daily.   - Continue Plavix. - Continue statin, lipids ok in 10/18.   Katie [name] 06/15/2017

## 2017-06-14 NOTE — Patient Instructions (Signed)
Decrease Asprin 81 mg daily  Increase Furosamide 40 mg (1 tab) twice a day for 2 days   -Then take 40 mg (1 tab) in AM, and 20 mg (1/2 tab) in PM  You have been referred to PYP scan for amyloid   Will call about cardiomems   Your physician recommends that you return for lab work in: 10 days   Your physician recommends that you schedule a follow-up appointment in: 3 weeks with Dr. [name]

## 2017-06-15 ENCOUNTER — Other Ambulatory Visit: Payer: Self-pay | Admitting: Licensed Clinical Social Worker

## 2017-06-15 ENCOUNTER — Other Ambulatory Visit: Payer: Self-pay | Admitting: *Deleted

## 2017-06-15 ENCOUNTER — Telehealth (HOSPITAL_COMMUNITY): Payer: Self-pay

## 2017-06-15 ENCOUNTER — Encounter: Payer: Self-pay | Admitting: *Deleted

## 2017-06-15 LAB — PROTEIN ELECTRO, RANDOM URINE
Albumin ELP, Urine: 100 %
Alpha-1-Globulin, U: 0 %
Alpha-2-Globulin, U: 0 %
Beta Globulin, U: 0 %
Gamma Globulin, U: 0 %
Total Protein, Urine: 7.6 mg/dL

## 2017-06-15 LAB — PROTEIN ELECTROPHORESIS, SERUM
A/G Ratio: 1.1 (ref 0.7–1.7)
Albumin ELP: 3.7 g/dL (ref 2.9–4.4)
Alpha-1-Globulin: 0.2 g/dL (ref 0.0–0.4)
Alpha-2-Globulin: 0.6 g/dL (ref 0.4–1.0)
Beta Globulin: 1 g/dL (ref 0.7–1.3)
Gamma Globulin: 1.8 g/dL (ref 0.4–1.8)
Globulin, Total: 3.5 g/dL (ref 2.2–3.9)
Total Protein ELP: 7.2 g/dL (ref 6.0–8.5)

## 2017-06-15 IMAGING — CR DG CHEST 2V
2 series · 2 of 2 positions shown · non-contrast
Comparison: 10/08/2014

CLINICAL DATA: Renal cancer.  Annual follow-up.

EXAM:
CHEST  2 VIEW

[w chest pa]
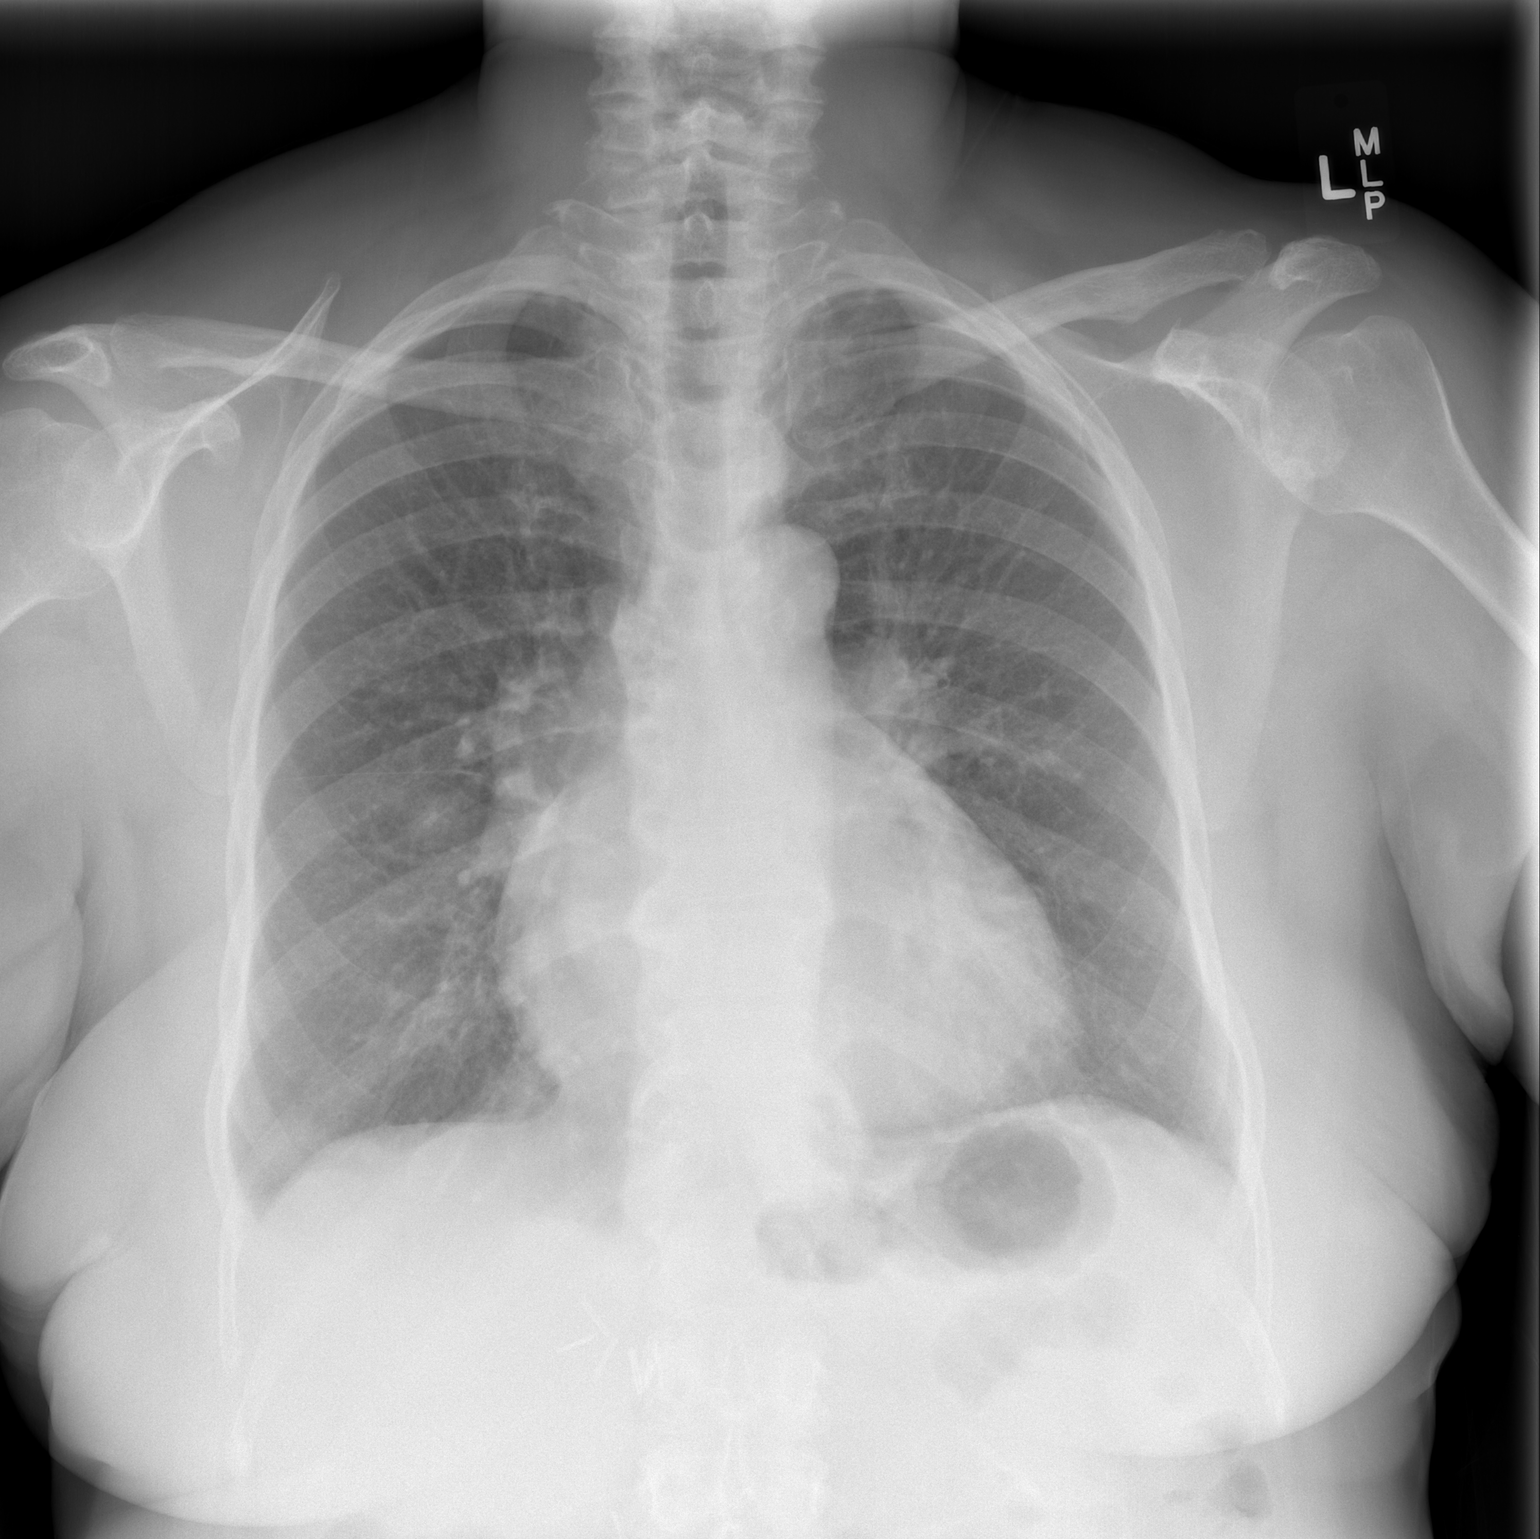

[w chest lat]
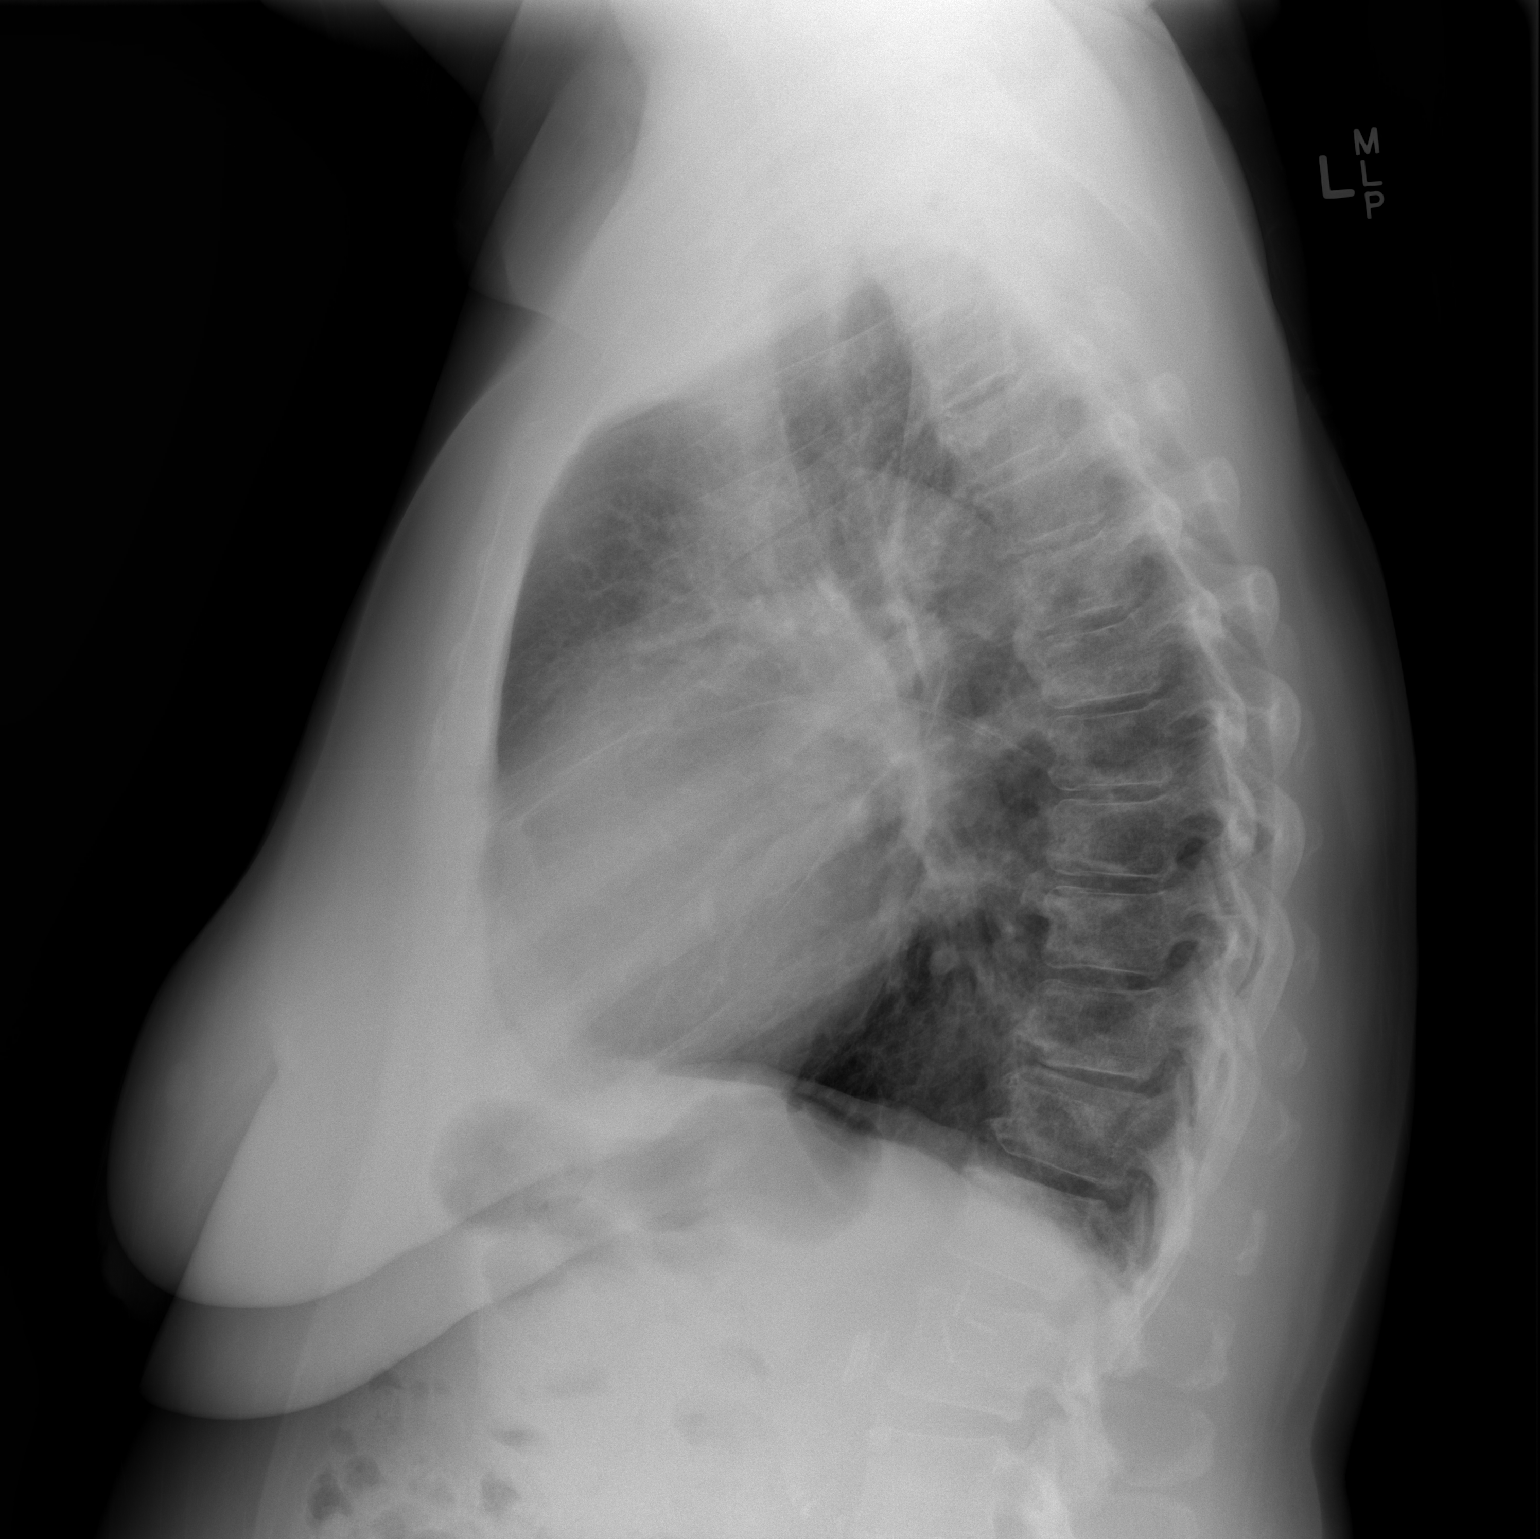

[2 of 2 positions shown; findings below may reference images not displayed]

FINDINGS: Heart is upper limits normal in size. No confluent airspace
opacities or effusions. Degenerative changes in the shoulders and
thoracic spine.
IMPRESSION: No active cardiopulmonary disease.

## 2017-06-15 NOTE — Telephone Encounter (Signed)
Pt agreed to f/u in 6 months with a MRA only wo. AW

## 2017-06-15 NOTE — Patient Outreach (Signed)
[facility]) Care Management  06/15/2017  Katie [name] Oct 03, 1945 [id number]  Assessment- CSW received new referral on patient for low income and senior housing resources. CSW completed outreach call to patient on 06/15/17 and was able to reach patient successfully. HIPPA verifications were received. CSW introduced self, reason for call and [facility] social work services. Patient reports her main social work need is she is interested in finding another apartment or home at a cheaper rate than the residence she is at now. Patient reports her income is $1,180 per month and her current rent is $450. Patient does not receive Section 8 and was informed that all of their wait list are closed at this time. Patient was encouraged to be on the look out for these wait list when they re-open. CSW provided education on available housing resources. CSW also provided education on the [employer] which is for low-income seniors. Patient denies interest in this program. Patient does not have access to online. CSW questioned if she would like for CSW to go to [email or web address] and put in her budget and print her out the available properties. Patient is agreeable and wishes for this information to be mailed to her residence. Patient is also agreeable to CSW mailing out housing resources and financial resources to her. Patient denies any other social work needs and was very Patent attorney of phone call and resource review. CSW will mail out all requested resources to patient.  Plan-CSW completed call to [name] and updated her. CSW will not open case at this time. CSW will send request for housing resources to be mailed to patient's residence.  Katie [name], BSW, MSW, [name] Phone: [phone] Fax: [phone]

## 2017-06-15 NOTE — Patient Outreach (Signed)
[facility]) Care Management   06/15/2017  LETHIA DONLON 07/29/1946 [id number]  Katie [name] is an 71 y.o. female  Subjective:   Member alert and oriented x3, denies any shortness of breath, denies pain or discomfort.  Report compliance with medications, diet and daily weights.  State she had appointment with heart failure clinic yesterday, some medication changes were made.  Compliant with changes.  She has follow up testing for cardiac amyloidosis for next week, labs the week after, and appointment with MD on 12/14.    Objective:   Review of Systems  Constitutional: Negative.   HENT: Negative.   Eyes: Negative.   Respiratory: Negative.   Cardiovascular: Negative.   Gastrointestinal: Negative.   Genitourinary: Negative.   Musculoskeletal: Negative.   Skin: Negative.   Neurological: Negative.   Endo/Heme/Allergies: Negative.   Psychiatric/Behavioral: Negative.     Physical Exam  Constitutional: She is oriented to person, place, and time. She appears well-developed and well-nourished.  Neck: Normal range of motion.  Cardiovascular: Normal rate, regular rhythm and normal heart sounds.  Respiratory: Effort normal and breath sounds normal.  GI: Soft. Bowel sounds are normal.  Musculoskeletal: Normal range of motion.  Neurological: She is alert and oriented to person, place, and time.  Skin: Skin is warm and dry.   BP 116/82 (BP Location: Right Arm, Patient Position: Sitting, Cuff Size: Normal)   Pulse 83   Resp 18   Wt 152 lb (68.9 kg)   LMP 02/12/2013   SpO2 97%   BMI 26.93 kg/m   Encounter Medications:   Outpatient Encounter Medications as of 06/15/2017  Medication Sig Note  . acetaminophen (TYLENOL) 500 MG tablet Take 1,000 mg by mouth every 6 (six) hours as needed for headache (pain).   Marland Kitchen aspirin 81 MG tablet Take 1 tablet (81 mg total) daily by mouth.   . butalbital-acetaminophen-caffeine (FIORICET, ESGIC) 50-325-40 MG tablet Take 1 tablet by  mouth every 12 (twelve) hours as needed for headache.   . clopidogrel (PLAVIX) 75 MG tablet Take 1 tablet (75 mg total) by mouth daily.   . divalproex (DEPAKOTE) 500 MG DR tablet Take 1 tablet (500 mg total) by mouth at bedtime.   . feeding supplement, ENSURE ENLIVE, (ENSURE ENLIVE) LIQD Take 237 mLs by mouth 2 (two) times daily between meals.   . furosemide (LASIX) 40 MG tablet 1 tab in AM, then 0.5 tab in PM   . levothyroxine (SYNTHROID, LEVOTHROID) 25 MCG tablet Take 25 mcg by mouth daily before breakfast. 05/08/2017: New medication 04/25/17 - pt took for about 4 days but then stopped because it was making her nauseous - waiting for further instructions from MD  . metoprolol tartrate (LOPRESSOR) 25 MG tablet Take 0.5 tablets (12.5 mg total) by mouth 2 (two) times daily.   . potassium chloride (MICRO-K) 10 MEQ CR capsule 1 tab in AM , and 0.5 tab in PM   . pravastatin (PRAVACHOL) 80 MG tablet Take 80 mg by mouth at bedtime.     No facility-administered encounter medications on file as of 06/15/2017.     Functional Status:   In your present state of health, do you have any difficulty performing the following activities: 05/08/2017 03/03/2017  Hearing? N N  Vision? N Y  Difficulty concentrating or making decisions? N N  Walking or climbing stairs? N N  Dressing or bathing? N N  Doing errands, shopping? N N  Preparing Food and eating ? - N  Using the  Toilet? - N  In the past six months, have you accidently leaked urine? - N  Do you have problems with loss of bowel control? - N  Managing your Medications? - N  Managing your Finances? - N  Housekeeping or managing your Housekeeping? - N  Some recent data might be hidden    Fall/Depression Screening:    Fall Risk  04/25/2017 03/03/2017  Falls in the past year? No Yes  Number falls in past yr: - 1  Injury with Fall? - No  Risk for fall due to : - History of fall(s);Impaired balance/gait;Impaired mobility;Impaired vision;Medication side effect   Follow up - Education provided;Falls prevention discussed;Follow up appointment   PHQ 2/9 Scores 03/03/2017 02/22/2017  PHQ - 2 Score 0 0    Assessment:    Met with member at scheduled time.  She has been compliant with current plan of care post discharge, health improving overall.  She is aware that transition of care program is complete, will continue involvement during testing for potential diagnosis of cardiac amyloidsis.  Will continue to provide member with education and support.  She denies urgent concerns/needs at this time, advised to contact with questions.  Plan:   Will follow up with member next month with routine home visit.  [facility] CM Care Plan Problem One     Most Recent Value  Care Plan Problem One  Risk for readmission related to heart failure as evidenced by recent hospitalization.    Role Documenting the Problem One  Care Management [place] for Problem One  Not Active  [facility]N Long Term Goal   Member will not be admitted to hospital within the next 31 days  [facility]N Long Term Goal Start Date  05/12/17  [facility] Long Term Goal Met Date  06/15/17  Interventions for Problem One Long Term Goal  EMMI education regarding heart failure management assigned/provided (including low sodium diet and daily weights).  [facility] CM Short Term Goal #1   Member will keep and attend follow up appointments with primary MD, cardiologist, and heart failure clinic within the next 4 weeks  [facility] CM Short Term Goal #1 Start Date  05/12/17  [facility] CM Short Term Goal #1 Met Date  06/01/17  Interventions for Short Term Goal #1  Reminded member of follow up appointment with cardiology tomorrow, reminded of dates/times.  Advised of importance of keeping appointments.  Confirmed member has transprotation.  [facility] CM Short Term Goal #2   Member will report compliance with medications within the next 4 weeks  [facility] CM Short Term Goal #2 Start Date  05/12/17  [facility] CM Short Term Goal #2 Met Date  05/25/17  Interventions  for Short Term Goal #2  Re-educated on importance of taking medications as prescribed in effort to decrease risk fo readmission    [facility] CM Care Plan Problem Two     Most Recent Value  Care Plan Problem Two  Knowledge deficit related to potential new diagnosis of cardiac amyloidosis  Role Documenting the Problem Two  Care Management [place] for Problem Two  Active  Interventions for Problem Two Long Term Goal   Member educated on potential diagnosis, including signs/symptoms.  Verified appointment dates/times for testing, labs and follow up discussion with MD.  Verified member has transportation to appointments   [place]  Member will be able to report results and plan of care within the next 45 days  THN Long Term Goal Start Date  06/15/17  [facility] CM Short Term Goal #1   Member will keep and attend appointment for cardiac testing and follow up appointment with MD within the next 4 weeks  [facility] CM Short Term Goal #1 Start Date  06/15/17  Interventions for Short Term Goal #2   Educated member on importance of attending appointments in effort to have definitive diagnosis and obtain treatment plan  [facility] CM Short Term Goal #2   Member will report compliance with medication changes within the next 2 weeks  [facility] CM Short Term Goal #2 Start Date  06/15/17  Interventions for Short Term Goal #2  Medication changes reviewed with member, educated on importance of taking as advised.  Advised to contact MD with concerns of increased symptoms     [name], MSN, [place] Manager [phone]

## 2017-06-16 NOTE — Progress Notes (Signed)
[place] working on [employer] and will schedule

## 2017-06-16 NOTE — Patient Outreach (Signed)
Request received from Brooke Joyce, LCSW to mail patient personal care resources.  Information mailed today. 

## 2017-06-20 ENCOUNTER — Ambulatory Visit (INDEPENDENT_AMBULATORY_CARE_PROVIDER_SITE_OTHER): Payer: Medicare Other | Admitting: *Deleted

## 2017-06-20 DIAGNOSIS — I639 Cerebral infarction, unspecified: Secondary | ICD-10-CM | POA: Diagnosis not present

## 2017-06-20 NOTE — Progress Notes (Signed)
Carelink Summary Report / Loop Recorder 

## 2017-06-20 NOTE — Progress Notes (Signed)
Working on Investment banker, operational. Will have [employer] schedule after I receive authorization from insurance.

## 2017-06-21 ENCOUNTER — Encounter (HOSPITAL_COMMUNITY): Payer: Medicare Other

## 2017-06-27 ENCOUNTER — Ambulatory Visit (HOSPITAL_COMMUNITY)
Admission: RE | Admit: 2017-06-27 | Discharge: 2017-06-27 | Disposition: A | Discharge status: Home / Self Care | Payer: Medicare Other | Source: Ambulatory Visit | Attending: Cardiology | Admitting: Cardiology

## 2017-06-27 DIAGNOSIS — I5032 Chronic diastolic (congestive) heart failure: Secondary | ICD-10-CM | POA: Insufficient documentation

## 2017-06-27 LAB — BASIC METABOLIC PANEL
Anion gap: 6 (ref 5–15)
BUN: 24 mg/dL — ABNORMAL HIGH (ref 6–20)
CO2: 30 mmol/L (ref 22–32)
Calcium: 9.8 mg/dL (ref 8.9–10.3)
Chloride: 107 mmol/L (ref 101–111)
Creatinine, Ser: 1.43 mg/dL — ABNORMAL HIGH (ref 0.44–1.00)
GFR calc Af Amer: 41 mL/min — ABNORMAL LOW (ref >60)
GFR calc non Af Amer: 36 mL/min — ABNORMAL LOW (ref >60)
Glucose, Bld: 112 mg/dL — ABNORMAL HIGH (ref 65–99)
Potassium: 3.9 mmol/L (ref 3.5–5.1)
Sodium: 143 mmol/L (ref 135–145)

## 2017-07-01 ENCOUNTER — Encounter (HOSPITAL_COMMUNITY): Payer: Medicare Other

## 2017-07-04 LAB — CUP PACEART REMOTE DEVICE CHECK
Date Time Interrogation Session: 20181117211118
Implantable Pulse Generator Implant Date: 20180720

## 2017-07-05 ENCOUNTER — Ambulatory Visit
Admission: RE | Admit: 2017-07-05 | Discharge: 2017-07-05 | Disposition: A | Discharge status: Home / Self Care | Payer: Medicare Other | Source: Ambulatory Visit | Attending: Nurse Practitioner | Admitting: Nurse Practitioner

## 2017-07-05 ENCOUNTER — Other Ambulatory Visit: Payer: Self-pay | Admitting: Nurse Practitioner

## 2017-07-05 ENCOUNTER — Other Ambulatory Visit: Payer: Self-pay | Admitting: *Deleted

## 2017-07-05 DIAGNOSIS — M25532 Pain in left wrist: Secondary | ICD-10-CM

## 2017-07-05 NOTE — Patient Outreach (Signed)
[facility]) Care Management  07/05/2017  Katie [name] 14-Aug-1945 [id number]   Call placed to member to follow up on current health status and to discuss [facility] as this care manager received a call from them stating they had received a referral.  Member was hesitant to agree to involvement with them stating that she was already involved with [facility].  She state she is "doing alright" but has not had her cardiac testing yet due to issues with insurance.  Report she will have testing done this week and follow up with heart failure clinic next week.  Denies shortness of breath at this time.  Discussed [facility], she remains resistant to involvement.  After further explanation, she state she will allow them to schedule visit to explain program again then make a decision on involvement.  She denies any urgent concerns at this time, will follow up with routine home visit within the next 2 weeks as planned.  [name], [place], MSN [place] [phone]

## 2017-07-06 ENCOUNTER — Encounter (HOSPITAL_COMMUNITY)
Admission: RE | Admit: 2017-07-06 | Discharge: 2017-07-06 | Disposition: A | Discharge status: Home / Self Care | Payer: Medicare Other | Source: Ambulatory Visit | Attending: Cardiology | Admitting: Cardiology

## 2017-07-06 ENCOUNTER — Ambulatory Visit (HOSPITAL_COMMUNITY)
Admission: RE | Admit: 2017-07-06 | Discharge: 2017-07-06 | Disposition: A | Discharge status: Home / Self Care | Payer: Medicare Other | Source: Ambulatory Visit | Attending: Cardiology | Admitting: Cardiology

## 2017-07-06 DIAGNOSIS — E859 Amyloidosis, unspecified: Secondary | ICD-10-CM | POA: Diagnosis present

## 2017-07-06 MED ORDER — TECHNETIUM TC 99M PYROPHOSPHATE
5.0000 | Freq: Once | INTRAVENOUS | Status: AC
  Administered 2017-07-06: 5 via INTRAVENOUS

## 2017-07-15 ENCOUNTER — Ambulatory Visit (HOSPITAL_COMMUNITY)
Admission: RE | Admit: 2017-07-15 | Discharge: 2017-07-15 | Disposition: A | Discharge status: Home / Self Care | Payer: Medicare Other | Source: Ambulatory Visit | Attending: Cardiology | Admitting: Cardiology

## 2017-07-15 ENCOUNTER — Encounter (HOSPITAL_COMMUNITY): Payer: Self-pay | Admitting: Cardiology

## 2017-07-15 VITALS — BP 131/82 | HR 93 | Wt 154.4 lb

## 2017-07-15 DIAGNOSIS — E859 Amyloidosis, unspecified: Secondary | ICD-10-CM

## 2017-07-15 DIAGNOSIS — I340 Nonrheumatic mitral (valve) insufficiency: Secondary | ICD-10-CM | POA: Diagnosis not present

## 2017-07-15 DIAGNOSIS — N183 Chronic kidney disease, stage 3 unspecified: Secondary | ICD-10-CM

## 2017-07-15 DIAGNOSIS — I5032 Chronic diastolic (congestive) heart failure: Secondary | ICD-10-CM | POA: Diagnosis not present

## 2017-07-15 DIAGNOSIS — Z79899 Other long term (current) drug therapy: Secondary | ICD-10-CM | POA: Diagnosis not present

## 2017-07-15 DIAGNOSIS — Z7982 Long term (current) use of aspirin: Secondary | ICD-10-CM | POA: Diagnosis not present

## 2017-07-15 DIAGNOSIS — I130 Hypertensive heart and chronic kidney disease with heart failure and stage 1 through stage 4 chronic kidney disease, or unspecified chronic kidney disease: Secondary | ICD-10-CM | POA: Diagnosis present

## 2017-07-15 LAB — BASIC METABOLIC PANEL
Anion gap: 6 (ref 5–15)
BUN: 19 mg/dL (ref 6–20)
CO2: 31 mmol/L (ref 22–32)
Calcium: 9.7 mg/dL (ref 8.9–10.3)
Chloride: 106 mmol/L (ref 101–111)
Creatinine, Ser: 1.35 mg/dL — ABNORMAL HIGH (ref 0.44–1.00)
GFR calc Af Amer: 44 mL/min — ABNORMAL LOW (ref >60)
GFR calc non Af Amer: 38 mL/min — ABNORMAL LOW (ref >60)
Glucose, Bld: 90 mg/dL (ref 65–99)
Potassium: 4.7 mmol/L (ref 3.5–5.1)
Sodium: 143 mmol/L (ref 135–145)

## 2017-07-15 NOTE — Patient Instructions (Signed)
Labs drawn today (if we do not call you, then your lab work was stable)   Completed Genetic testing today  Signed paperwork for Cardiomemes  Your physician recommends that you schedule a follow-up appointment in: 2 months with Dr. [name]

## 2017-07-17 NOTE — Progress Notes (Signed)
PCP: Dr. [name] HF Cardiology: Dr. [name]  71 yo with history of chronic diastolic CHF, mitral regurgitation, CVA, and CKD returns for followup of CHF.   Patient was initially admitted with diastolic CHF in 1/61. Echo showed EF 60-65% with grade 3 diastolic dysfunction and moderate MR.  She had cath showing no coronary disease. 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  TEE this admission showed moderate-severe MR.  She was admitted again in 10/18 with acute on chronic diastolic CHF. Echo this admission showed EF 50-55%, severe LVH, restrictive diastolic dysfunction, severe MR, moderate-severe TR.  She was referred to Dr. [name] who saw her recently for mitral valve evaluation.  Upon review of studies, regurgitation thought to be more in the moderate range and likely functional.   Patient had PYP amyloid scan in 12/18, this was suggestive of transthyretin amyloidosis.    She feels like her breathing is better on increased Lasix.  No dyspnea with exertion currently.  No orthopnea/PND.  No chest pain.  She reports tingling/numbness in her feet bilaterally.     ECG (10/18, personally reviewed): NSR, low voltage in limb leads, nonspecific T wave flattening.   Labs (10/18): K 4.4, creatinine 1.27, LDL 56, HDL 39 Labs (11/18): K 3.9, creatinine 1.43, SPEP negative  PMH: 1. HTN 2. Hyperlipidemia 3. 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  LINQ monitor placed.  4. Chronic diastolic CHF:  - LHC (0/96): Normal coronaries.  - Echo (10/18): EF 50-55%, severe LVH with speckled myocardium concerning for amyloidosis, restrictive diastolic function with septal flattening, severe MR, moderate-severe TR, severe biatrial enlargement, PASP 54 mmHg.  - PYP amyloid scan: Grade 3, suggestive of transthyretin amyloidosis.  5. Mitral regurgitation: Probably functional.  - TEE (7/18): EF 45-50%, moderate-severe MR, mild-moderate TR.  - Echo (10/18) read as severe MR.  6. CKD stage 3 7.  Hypothyroidism 8. H/o renal cell CA s/p nephrectomy.   Social History   Socioeconomic History  . Marital status: Single    Spouse name: Not on file  . Number of children: Not on file  . Years of education: Not on file  . Highest education level: Not on file  Social Needs  . Financial resource strain: Not on file  . Food insecurity - worry: Not on file  . Food insecurity - inability: Not on file  . Transportation needs - medical: Not on file  . Transportation needs - non-medical: Not on file  Occupational History  . Not on file  Tobacco Use  . Smoking status: Never Smoker  . Smokeless tobacco: Never Used  Substance and Sexual Activity  . Alcohol use: No  . Drug use: No  . Sexual activity: No  Other Topics Concern  . Not on file  Social History Narrative  . Not on file   Family History  Problem Relation Age of Onset  . Stroke Brother   . Diabetes Mellitus II Sister    ROS: All systems reviewed and negative except as per HPI.   Current Outpatient Medications  Medication Sig Dispense Refill  . aspirin 81 MG tablet Take 1 tablet (81 mg total) daily by mouth.    . clopidogrel (PLAVIX) 75 MG tablet Take 1 tablet (75 mg total) by mouth daily. 30 tablet 1  . furosemide (LASIX) 40 MG tablet 1 tab in AM, then 0.5 tab in PM 90 tablet 3  . metoprolol tartrate (LOPRESSOR) 25 MG tablet Take 0.5 tablets (12.5 mg total) by  mouth 2 (two) times daily. 90 tablet 3  . potassium chloride (MICRO-K) 10 MEQ CR capsule 1 tab in AM , and 0.5 tab in PM 45 capsule 11  . pravastatin (PRAVACHOL) 80 MG tablet Take 80 mg by mouth at bedtime.     Marland Kitchen acetaminophen (TYLENOL) 500 MG tablet Take 1,000 mg by mouth every 6 (six) hours as needed for headache (pain).    . butalbital-acetaminophen-caffeine (FIORICET, ESGIC) 50-325-40 MG tablet Take 1 tablet by mouth every 12 (twelve) hours as needed for headache. 14 tablet 0  . divalproex (DEPAKOTE) 500 MG DR tablet Take 1 tablet (500 mg total) by mouth at  bedtime. 30 tablet 0  . feeding supplement, ENSURE ENLIVE, (ENSURE ENLIVE) LIQD Take 237 mLs by mouth 2 (two) times daily between meals. 237 mL 0  . levothyroxine (SYNTHROID, LEVOTHROID) 25 MCG tablet Take 25 mcg by mouth daily before breakfast.     No current facility-administered medications for this encounter.    BP 131/82 (BP Location: Left Arm, Patient Position: Sitting, Cuff Size: Normal)   Pulse 93   Wt 154 lb 6.4 oz (70 kg)   LMP 02/12/2013   SpO2 100%   BMI 27.35 kg/m  General: NAD Neck: No JVD, no thyromegaly or thyroid nodule.  Lungs: Clear to auscultation bilaterally with normal respiratory effort. CV: Nondisplaced PMI.  Heart regular S1/S2, no S3/S4, 1/6 HSM apex.  1+ ankle edema.  No carotid bruit.  Normal pedal pulses.  Abdomen: Soft, nontender, no hepatosplenomegaly, no distention.  Skin: Intact without lesions or rashes.  Neurologic: Alert and oriented x 3.  Psych: Normal affect. Extremities: No clubbing or cyanosis.  HEENT: Normal.   Assessment/Plan: 1. Chronic diastolic CHF: Normal EF on 10/18 echo with severe LVH and speckled myocardium consistent with cardiac amyloidosis.  SPEP was negative.  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  Now NYHA class II with much improved volume status.  - Awaiting cardiac MRI.  - Given suspicion for transthyretin amyloidosis, will sent genetic testing.  If she has hereditary TTR amyloidosis, will try to arrange for patisiran treatment.  If wild type, will look into tafamidis.  She does not report a family history consistent with hereditary TTR amyloidosis.  - Continue Lasix 40 qam/20 qpm.   - We are working to get insurance approval for [employer] device.  2. Mitral regurgitation: Review of most recent TTE and also TEE suggest that the MR is more in the moderate range and likely functional.  No surgery for now, continue medical management of CHF and follow MR over time.  3. CKD: Stage III.  Follow  closely with diuresis. BMET today.  4. CVA: 7/18. Has LINQ monitor, no atrial fibrillation detected so far.  - Continue ASA 81.   - Continue Plavix. - Continue statin, lipids ok in 10/18.  5. Neuropathy: Suspect peripheral neuropathy with tingling in feet, may be due to amyloidosis.   Loralie Champagne 07/17/2017

## 2017-07-18 ENCOUNTER — Ambulatory Visit (INDEPENDENT_AMBULATORY_CARE_PROVIDER_SITE_OTHER): Payer: Medicare Other | Admitting: *Deleted

## 2017-07-18 DIAGNOSIS — I639 Cerebral infarction, unspecified: Secondary | ICD-10-CM

## 2017-07-19 NOTE — Progress Notes (Signed)
Carelink Summary Report / Loop Recorder 

## 2017-07-20 ENCOUNTER — Encounter: Payer: Self-pay | Admitting: *Deleted

## 2017-07-20 ENCOUNTER — Other Ambulatory Visit: Payer: Self-pay | Admitting: *Deleted

## 2017-07-20 NOTE — Patient Outreach (Signed)
[place] [facility]) Care Management   07/20/2017  DANIYLA PFAHLER Feb 15, 1946 [id number]  JAHNAE MCADOO is an 71 y.o. female  Subjective:   Member alert and oriented x3, denies shortness of breath or chest discomfort.  Denies any swelling in legs, feet, or hands.  Report compliance with medications.  Objective:   Review of Systems  Constitutional: Negative.   HENT: Negative.   Eyes: Negative.   Respiratory: Negative.   Cardiovascular: Negative.   Gastrointestinal: Negative.   Genitourinary: Negative.   Musculoskeletal: Negative.   Skin: Negative.   Neurological: Negative.   Endo/Heme/Allergies: Negative.   Psychiatric/Behavioral: Negative.     Physical Exam  Constitutional: She is oriented to person, place, and time. She appears well-developed and well-nourished.  Neck: Normal range of motion.  Cardiovascular: Normal rate, regular rhythm and normal heart sounds.  Respiratory: Effort normal and breath sounds normal.  GI: Soft. Bowel sounds are normal.  Musculoskeletal: Normal range of motion.  Neurological: She is alert and oriented to person, place, and time.  Skin: Skin is warm and dry.   BP 126/80 (BP Location: Left Arm, Patient Position: Sitting, Cuff Size: Normal)   Pulse 80   Resp 18   Wt 154 lb (69.9 kg)   LMP 02/12/2013   SpO2 98%   BMI 27.28 kg/m   Encounter Medications:   Outpatient Encounter Medications as of 07/20/2017  Medication Sig Note  . acetaminophen (TYLENOL) 500 MG tablet Take 1,000 mg by mouth every 6 (six) hours as needed for headache (pain).   Marland Kitchen aspirin 81 MG tablet Take 1 tablet (81 mg total) daily by mouth.   . butalbital-acetaminophen-caffeine (FIORICET, ESGIC) 50-325-40 MG tablet Take 1 tablet by mouth every 12 (twelve) hours as needed for headache.   . clopidogrel (PLAVIX) 75 MG tablet Take 1 tablet (75 mg total) by mouth daily.   . divalproex (DEPAKOTE) 500 MG DR tablet Take 1 tablet (500 mg total) by mouth at bedtime.   .  feeding supplement, ENSURE ENLIVE, (ENSURE ENLIVE) LIQD Take 237 mLs by mouth 2 (two) times daily between meals.   . furosemide (LASIX) 40 MG tablet 1 tab in AM, then 0.5 tab in PM   . levothyroxine (SYNTHROID, LEVOTHROID) 25 MCG tablet Take 25 mcg by mouth daily before breakfast. 05/08/2017: New medication 04/25/17 - pt took for about 4 days but then stopped because it was making her nauseous - waiting for further instructions from MD  . metoprolol tartrate (LOPRESSOR) 25 MG tablet Take 0.5 tablets (12.5 mg total) by mouth 2 (two) times daily.   . potassium chloride (MICRO-K) 10 MEQ CR capsule 1 tab in AM , and 0.5 tab in PM   . pravastatin (PRAVACHOL) 80 MG tablet Take 80 mg by mouth at bedtime.     No facility-administered encounter medications on file as of 07/20/2017.     Functional Status:   In your present state of health, do you have any difficulty performing the following activities: 05/08/2017 03/03/2017  Hearing? N N  Vision? N Y  Difficulty concentrating or making decisions? N N  Walking or climbing stairs? N N  Dressing or bathing? N N  Doing errands, shopping? N N  Preparing Food and eating ? - N  Using the Toilet? - N  In the past six months, have you accidently leaked urine? - N  Do you have problems with loss of bowel control? - N  Managing your Medications? - N  Managing your Finances? - N  Housekeeping or managing your Housekeeping? - N  Some recent data might be hidden    Fall/Depression Screening:    Fall Risk  04/25/2017 03/03/2017  Falls in the past year? No Yes  Number falls in past yr: - 1  Injury with Fall? - No  Risk for fall due to : - History of fall(s);Impaired balance/gait;Impaired mobility;Impaired vision;Medication side effect  Follow up - Education provided;Falls prevention discussed;Follow up appointment   PHQ 2/9 Scores 03/03/2017 02/22/2017  PHQ - 2 Score 0 0    Assessment:    Met with member at scheduled time.  She confirms that she has completed  all cardiac testing, diagnosed with transthyretin amyloidosis, no treatment plan developed at this point.  She appears well, no distress noted.  State she is very happy with her continued health progress.  She has follow up appointment with cardiac surgeon regarding mitral regurgitation & neurologist within the next 3 weeks.  Has appointment with heart failure clinic & primary MD in February.  She expresses concern regarding letter received from [employer] stating there may be a change in benefits for next month due to an increase in income.  Call placed to number provided on letter to inquire about the change, however they are unable to confirm if member's amount of food stamps will be affected by the change.  She will have to wait until January.    Member denies any other questions at this time.  Discussed member goals met and possible transition to health coach.  She agrees, advised should needs change based on treatment plan for amyloidisos, she is able to contact this care manager or inform health coach to place new referral to community nursing.  Plan:   Will place referral to health coach. Will send primary MD a case closure letter.  [facility] CM Care Plan Problem Two     Most Recent Value  Care Plan Problem Two  Knowledge deficit related to potential new diagnosis of cardiac amyloidosis  Role Documenting the Problem Two  Care Management Coordinator  Care Plan for Problem Two  Not Active  Interventions for Problem Two Long Term Goal   Member educated on potential diagnosis, including signs/symptoms.  Verified appointment dates/times for testing, labs and follow up discussion with MD.  Verified member has transportation to appointments   [place]  Member will be able to report results and plan of care within the next 45 days  [facility] Long Term Goal Start Date  06/15/17  [facility] Long Term Goal Met Date  07/20/17  [facility] CM Short Term Goal #1   Member will keep and attend  appointment for cardiac testing and follow up appointment with MD within the next 4 weeks  [facility] CM Short Term Goal #1 Start Date  06/15/17  [facility] CM Short Term Goal #1 Met Date   07/20/17  Interventions for Short Term Goal #2   Educated member on importance of attending appointments in effort to have definitive diagnosis and obtain treatment plan  [facility] CM Short Term Goal #2   Member will report compliance with medication changes within the next 2 weeks  [facility] CM Short Term Goal #2 Start Date  06/15/17  [facility] CM Short Term Goal #2 Met Date  07/20/17  Interventions for Short Term Goal #2  Medication changes reviewed with member, educated on importance of taking as advised.  Advised to contact MD with concerns of increased symptoms     [name], RN, MSN  [place] [phone]

## 2017-07-22 ENCOUNTER — Other Ambulatory Visit: Payer: Self-pay

## 2017-07-22 NOTE — Patient Outreach (Signed)
[place] [facility]) Care Management  07/22/2017  DORIANNA MCKIVER 1945/08/14 [id number]   Telephone call placed to the patient. HIPPA verified. [place] introduced myself to the patient.  I explained what the next phone call would entail.   Plan:  RN Health Coach will make an outreach attempt to the patient in the month of December.  Lazaro Arms RN, BSN, [place] Direct Dial:  [phone] Fax: [phone]

## 2017-07-28 ENCOUNTER — Other Ambulatory Visit: Payer: Self-pay | Admitting: Licensed Clinical Social Worker

## 2017-07-28 ENCOUNTER — Other Ambulatory Visit: Payer: Self-pay

## 2017-07-28 LAB — CUP PACEART REMOTE DEVICE CHECK
Date Time Interrogation Session: 20181217210839
Implantable Pulse Generator Implant Date: 20180720

## 2017-07-28 NOTE — Patient Outreach (Signed)
[place] [facility]) Care Management  07/28/2017  Katie [name] 06/10/1946 [id number]  Assessment- CSW received new referral on patient on 07/28/17. Patient is interested in gaining food assistance resources. CSW completed outreach call to patient and was able to successfully reach her. HIPPA verifications were provided. CSW introduced self, reason for call and of [facility] social work services. Patient reports that she is needing food assistance resources to be mailed out to her. CSW completed entire review of [facility] [place] (list of free meals available to her throughout the community 7 days per week) and [facility] [place] (list of food pantries available 7 days per week throughout the [place]. Patient reports that she has a car and has stable transportation. She reports that both of these resources will be very helpful to her. Patient receives $15.00 in food stamps but reports that these resources will come in handy. Patient denies any other social work needs. CSW will not open case at this time. Patient is agreeable to CSW mailing her out 2 copies of each resource so that she can put 1 copy of each in a safe place in case she loses resources.  Plan-CSW will update [place] that she will not open case. CSW will send requested resources to patient.  Eula Fried, BSW, MSW, [email or web address] Phone: [phone] Fax: [phone]

## 2017-07-28 NOTE — Patient Outreach (Addendum)
[place] [facility]) Care Management  [place]  07/28/2017   NYJAE HODGE 02/09/1946 [id number]  Subjective: Telephone call placed to the patient for an initial assessment.  HIPAA verified.  The patient states that she is doing fine.  She states that she is independent with her ADLS/IADLS. She states that she is able to take herself to and from appointments.  She has a blood pressure monitor and scale in the home. She states that her blood pressure yesterday was 108/71 and weight today 156 lbs. She states that she gets exercise by walking. The patient states that she has all of her medications and is adherent taking them. She does not have an advance directive and did not want any information.  The patient states that she would like to know about  some resources for food.   She only gets fifteen dollars in food stamps. Discussed objectives that I would like to discuss with the patient on our next talk.  The patient verbalized understanding.   Objective:   Encounter Medications:  Outpatient Encounter Medications as of 07/28/2017  Medication Sig Note  . acetaminophen (TYLENOL) 500 MG tablet Take 1,000 mg by mouth every 6 (six) hours as needed for headache (pain).   Marland Kitchen aspirin 81 MG tablet Take 1 tablet (81 mg total) daily by mouth.   . butalbital-acetaminophen-caffeine (FIORICET, ESGIC) 50-325-40 MG tablet Take 1 tablet by mouth every 12 (twelve) hours as needed for headache.   . clopidogrel (PLAVIX) 75 MG tablet Take 1 tablet (75 mg total) by mouth daily.   . divalproex (DEPAKOTE) 500 MG DR tablet Take 1 tablet (500 mg total) by mouth at bedtime.   . feeding supplement, ENSURE ENLIVE, (ENSURE ENLIVE) LIQD Take 237 mLs by mouth 2 (two) times daily between meals.   . furosemide (LASIX) 40 MG tablet 1 tab in AM, then 0.5 tab in PM   . levothyroxine (SYNTHROID, LEVOTHROID) 25 MCG tablet Take 25 mcg by mouth daily before breakfast. 05/08/2017: New medication 04/25/17 - pt took for  about 4 days but then stopped because it was making her nauseous - waiting for further instructions from MD  . metoprolol tartrate (LOPRESSOR) 25 MG tablet Take 0.5 tablets (12.5 mg total) by mouth 2 (two) times daily.   . potassium chloride (MICRO-K) 10 MEQ CR capsule 1 tab in AM , and 0.5 tab in PM   . pravastatin (PRAVACHOL) 80 MG tablet Take 80 mg by mouth at bedtime.     No facility-administered encounter medications on file as of 07/28/2017.     Functional Status:  In your present state of health, do you have any difficulty performing the following activities: 05/08/2017 03/03/2017  Hearing? N N  Vision? N Y  Difficulty concentrating or making decisions? N N  Walking or climbing stairs? N N  Dressing or bathing? N N  Doing errands, shopping? N N  Preparing Food and eating ? - N  Using the Toilet? - N  In the past six months, have you accidently leaked urine? - N  Do you have problems with loss of bowel control? - N  Managing your Medications? - N  Managing your Finances? - N  Housekeeping or managing your Housekeeping? - N  Some recent data might be hidden    Fall/Depression Screening: Fall Risk  07/28/2017 04/25/2017 03/03/2017  Falls in the past year? No No Yes  Number falls in past yr: - - 1  Injury with Fall? - - No  Risk for fall due to : - - History of fall(s);Impaired balance/gait;Impaired mobility;Impaired vision;Medication side effect  Follow up - - Education provided;Falls prevention discussed;Follow up appointment   PHQ 2/9 Scores 07/28/2017 03/03/2017 02/22/2017  PHQ - 2 Score 0 0 0    Assessment: Patient will benefit from health coach outreach for disease management and support.  [facility] CM Care Plan Problem One     Most Recent Value  Care Plan Problem One  Knowledge deficit  Role Documenting the Problem One  [place] for Problem One  Active  [facility] Long Term Goal   In the next 90 days the patient will not be admitted to the hospital  [place] Term Goal  Start Date  07/28/17  Interventions for Problem One Long Term Goal  [facility] will send the patient educational material  on heart failure  [facility] CM Short Term Goal #1   In the next 30 days the patient will verbalize monitoring edema in legs and feet  [facility] CM Short Term Goal #1 Start Date  07/28/17  Interventions for Short Term Goal #1  [facility] will send education on swelling  TH[facility] CM Short Term Goal #2   In the next 30 days the patient will verbalize compliance with her medications  [facility] CM Short Term Goal #2 Start Date  07/28/17  Interventions for Short Term Goal #2  [facility] discussed with the patient about her medications and what they are used for      Plan: [place] will provide ongoing education for patient on heart failure through phone calls and sending printed information to patient for further discussion.  RN Health Coach will send welcome packet with consent to patient as well as printed information on heart failure.  RN Health Coach will send initial barriers letter, assessment, and care plan to primary care physician. RN Health Coach will contact patient in the month of January and patient agrees to next outreach.  Lazaro Arms RN, BSN, [place] Direct Dial:  [phone] Fax: [phone]

## 2017-07-28 NOTE — Patient Outreach (Signed)
Request received from Brooke Joyce, LCSW to mail patient personal care resources.  Information mailed today. 

## 2017-08-08 ENCOUNTER — Ambulatory Visit: Payer: Medicare Other | Admitting: Thoracic Surgery (Cardiothoracic Vascular Surgery)

## 2017-08-08 NOTE — Progress Notes (Signed)
[facility]  PATIENT: Katie [name] DOB: 09/03/1945   REASON FOR VISIT: Follow-up of stroke HISTORY FROM: Patient    HISTORY OF PRESENT ILLNESS: 9/24/18PSMs Katie [name] is a 68 year african american lady seen today for first office f/u visit after [facility] admission for stroke in July 2018.Katie [name] an 72 y.o.femalewith history of CHF, renal cancer s/p R nephrectomy 2007, stage III CKD, HTN, HLD, and just discharged on 02/12/17 for strokewho was admitted again for CP. She was found to have acute on chronic heart failure and was treated for such. However, pt continues to complain of HA. Neurology was consulted for HA management.Pt was admitted from 02/10/17 to 02/12/17 for HA. Stroke workup found acute infarct affecting the superior cerebellum on the left. 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 TTE showed no cardiac source of emboli however moderate pulmonary hypertension was found. LDL 88, A1c 5.6. She was put on dual antiplatelet for 3 months and then Plavix alone. She was also continued on pravastatin 80 mg daily. She had outpatient transesophageal echocardiogram which was negative for cardiac source of embolism and had loop recorder placed and so for atrial fibrillation has not yet been found.. During the admission, she continued complaining of headache, but gradually getting better at discharge, although not resolved. Since discharge, she continued to have intermittent headache. She states the headaches and now much less in about once a week or so. She is complaining of some swelling in the legs and weight gain which may be related to Depakote. She denies any focal neurological symptoms. She states her blood pressure is well controlled and today it is 115/83. She is starting protocol well without muscle aches and pains. She remains on aspirin and Plavix which is tolerating well but denies significant  bruising or bleeding. She has an upcoming appointment to see Dr. Ricard Dillon to discuss heart valve surgery. She had follow-up MRI scan of the brain done on 04/22/17 which I personally reviewed shows old left cerebellar and left parietal infarcts without acute finding. MRA of the brain shows complete recanalization of the previously occluded distal basilar artery indicating that the previous occlusion with embolic and not intrinsic atherosclerosis. UPDATE 1/8/2019[name], 72 year old female returns for follow-up with history of stroke event in July 2018.  She has had 2 additional admissions for congestive heart failure since that time.  She is followed by Dr. Ricard Dillon and Dr. [name].  She has not had mitral valve repair.  Loop recorder so far no atrial fibrillation.  She remains on Plavix and aspirin for secondary stroke prevention.  She has not had further stroke or TIA symptoms.  She has minimal bruising and no bleeding.  Most recent lipid profile 05/09/2017 with LDL 56 HDL 39.  She remains on Pravachol without myalgias.  She continues to have intermittent mild headache.  She claims this is usually relieved with Tylenol arthritis.  She was asked to stop the Depakote she is not getting much benefit from this as a preventive.  She has mild peripheral edema today.  She complains with shortness of breath with exertion at times although she claims she walks to the grocery store which is a couple of blocks from her home.  Blood pressure in the office today 136/87.  She returns for reevaluation REVIEW OF SYSTEMS: Full 14 system review of systems performed and notable only for those listed, all others are neg:  Constitutional: neg  Cardiovascular:  leg swelling Ear/Nose/Throat: neg  Skin: neg Eyes: neg Respiratory: Shortness of breath with exertion Gastroitestinal: neg  Hematology/Lymphatic: neg  Endocrine: neg Musculoskeletal:neg Allergy/Immunology: neg Neurological: neg Psychiatric: neg Sleep :  neg   ALLERGIES: Allergies  Allergen Reactions  . Tramadol Shortness Of Breath, Palpitations and Other (See Comments)    HOME MEDICATIONS: Outpatient Medications Prior to Visit  Medication Sig Dispense Refill  . acetaminophen (TYLENOL) 500 MG tablet Take 1,000 mg by mouth every 6 (six) hours as needed for headache (pain).    Marland Kitchen aspirin 81 MG tablet Take 1 tablet (81 mg total) daily by mouth.    . butalbital-acetaminophen-caffeine (FIORICET, ESGIC) 50-325-40 MG tablet Take 1 tablet by mouth every 12 (twelve) hours as needed for headache. 14 tablet 0  . clopidogrel (PLAVIX) 75 MG tablet Take 1 tablet (75 mg total) by mouth daily. 30 tablet 1  . divalproex (DEPAKOTE) 500 MG DR tablet Take 1 tablet (500 mg total) by mouth at bedtime. 30 tablet 0  . feeding supplement, ENSURE ENLIVE, (ENSURE ENLIVE) LIQD Take 237 mLs by mouth 2 (two) times daily between meals. 237 mL 0  . furosemide (LASIX) 40 MG tablet 1 tab in AM, then 0.5 tab in PM 90 tablet 3  . levothyroxine (SYNTHROID, LEVOTHROID) 25 MCG tablet Take 25 mcg by mouth daily before breakfast.    . metoprolol tartrate (LOPRESSOR) 25 MG tablet Take 0.5 tablets (12.5 mg total) by mouth 2 (two) times daily. 90 tablet 3  . potassium chloride (MICRO-K) 10 MEQ CR capsule 1 tab in AM , and 0.5 tab in PM 45 capsule 11  . pravastatin (PRAVACHOL) 80 MG tablet Take 80 mg by mouth at bedtime.      No facility-administered medications prior to visit.     PAST MEDICAL HISTORY: Past Medical History:  Diagnosis Date  . Chronic combined systolic and diastolic CHF (congestive heart failure) ([place])   . CKD (chronic kidney disease), stage III ([place])    removal of right kidney due to carcinoma  . Headache   . History of kidney cancer    a. s/p right nephrectomy ~2007.  Marland Kitchen History of loop recorder   . Hypercholesterolemia   . Hypertension   . Mitral regurgitation   . Pulmonary hypertension ([place])   . Stroke [facility])     PAST SURGICAL HISTORY: Past  Surgical History:  Procedure Laterality Date  . CARDIAC CATHETERIZATION N/A 01/12/2016   Procedure: Right/Left Heart Cath and Coronary/Graft Angiography;  Surgeon: Belva Crome, MD;  Location: [place] [facility];  Service: Cardiovascular;  Laterality: N/A;  . CERVICAL POLYPECTOMY N/A 04/23/2013   Procedure: CERVICAL POLYPECTOMY;  Surgeon: Maeola Sarah. Landry Mellow, MD;  Location: [facility] [facility];  Service: Gynecology;  Laterality: N/A;  Possible Polypectomy  . HYSTEROSCOPY W/D&C N/A 04/23/2013   Procedure: DILATATION AND CURETTAGE /HYSTEROSCOPY;  Surgeon: Maeola Sarah. Landry Mellow, MD;  Location: [facility] [place] [facility];  Service: Gynecology;  Laterality: N/A;  . IR ANGIO INTRA EXTRACRAN SEL COM CAROTID INNOMINATE BILAT MOD SED  02/11/2017  . IR ANGIO VERTEBRAL SEL VERTEBRAL BILAT MOD SED  02/11/2017  . KNEE ARTHROSCOPY Left   . LOOP RECORDER INSERTION N/A 02/18/2017   Procedure: Loop Recorder Insertion;  Surgeon: Constance Haw, MD;  Location: [place] [facility];  Service: Cardiovascular;  Laterality: N/A;  . NEPHRECTOMY Right    due to carcinoma  . TEE WITHOUT CARDIOVERSION N/A 02/18/2017   Procedure: TRANSESOPHAGEAL ECHOCARDIOGRAM (TEE) WITH LOOP;  Surgeon: Thayer Headings, MD;  Location: [facility] [facility];  Service: Cardiovascular;  Laterality: N/A;    FAMILY HISTORY: Family History  Problem Relation Age of Onset  . Stroke Brother   . Diabetes Mellitus II Sister   . Colon cancer Sister     SOCIAL HISTORY: Social History   Socioeconomic History  . Marital status: Single    Spouse name: Not on file  . Number of children: Not on file  . Years of education: Not on file  . Highest education level: Not on file  Social Needs  . Financial resource strain: Not on file  . Food insecurity - worry: Not on file  . Food insecurity - inability: Not on file  . Transportation needs - medical: Not on file  . Transportation needs - non-medical: Not on file  Occupational History  . Not on file  Tobacco Use  . Smoking status: Never Smoker  .  Smokeless tobacco: Never Used  Substance and Sexual Activity  . Alcohol use: No  . Drug use: No  . Sexual activity: No  Other Topics Concern  . Not on file  Social History Narrative  . Not on file     PHYSICAL EXAM  Vitals:   08/09/17 0723  BP: 136/87  Pulse: 89  Weight: 159 lb (72.1 kg)  Height: 5\' 3"  (1.6 m)   Body mass index is 28.17 kg/m.  Generalized: Well developed, in no acute distress  Head: normocephalic and atraumatic,. Oropharynx benign  Neck: Supple, no carotid bruits  Cardiac: Regular rate rhythm, no murmur  Musculoskeletal: No deformity  Skin 1+ pedal edema  Neurological examination   Mentation: Alert oriented to time, place, history taking. Attention span and concentration appropriate. Recent and remote memory intact.  Follows all commands speech and language fluent.   Cranial nerve II-XII: Pupils were equal round reactive to light extraocular movements were full, visual field were full on confrontational test. Facial sensation and strength were normal. hearing was intact to finger rubbing bilaterally. Uvula tongue midline. head turning and shoulder shrug were normal and symmetric.Tongue protrusion into cheek strength was normal. Motor: normal bulk and tone, full strength in the BUE, BLE,  Sensory: normal and symmetric to light touch, pinprick, and  Vibration, in the upper and lower extremities   Coordination: finger-nose-finger, heel-to-shin bilaterally, no dysmetria, no tremor  Reflexes: 1+ upper and lower and symmetric, plantar responses were flexor bilaterally. Gait and Station: Rising up from seated position without assistance, normal stance,  moderate stride, good arm swing, smooth turning, able to perform tiptoe, and heel walking without difficulty. Tandem gait is steady.  No assistive device.  Ambulated 30 feet in the hall, no shortness of breath  DIAGNOSTIC DATA (LABS, IMAGING, TESTING) - I reviewed patient records, labs, notes, testing and imaging  myself where available.  Lab Results  Component Value Date   WBC 3.8 (L) 05/08/2017   HGB 12.4 05/08/2017   HCT 38.9 05/08/2017   MCV 95.3 05/08/2017   PLT 218 05/08/2017      Component Value Date/Time   NA 143 07/15/2017 1042   NA 142 05/26/2017 0856   K 4.7 07/15/2017 1042   CL 106 07/15/2017 1042   CO2 31 07/15/2017 1042   GLUCOSE 90 07/15/2017 1042   BUN 19 07/15/2017 1042   BUN 21 05/26/2017 0856   CREATININE 1.35 (H) 07/15/2017 1042   CREATININE 1.48 (H) 05/03/2016 0830   CALCIUM 9.7 07/15/2017 1042   PROT 6.5 05/08/2017 2143   PROT 7.0 02/24/2017 0821   ALBUMIN 3.6 05/08/2017 2143  ALBUMIN 4.0 02/24/2017 0821   AST 56 (H) 05/08/2017 2143   ALT 55 (H) 05/08/2017 2143   ALKPHOS 136 (H) 05/08/2017 2143   BILITOT 1.7 (H) 05/08/2017 2143   BILITOT 0.5 02/24/2017 0821   GFRNONAA 38 (L) 07/15/2017 1042   GFRAA 44 (L) 07/15/2017 1042   Lab Results  Component Value Date   CHOL 105 05/09/2017   HDL 39 (L) 05/09/2017   LDLCALC 56 05/09/2017   TRIG 48 05/09/2017   CHOLHDL 2.7 05/09/2017   Lab Results  Component Value Date   HGBA1C 5.2 05/09/2017    Lab Results  Component Value Date   TSH 3.305 01/08/2016      ASSESSMENT AND PLAN   28 lady with the 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 Remote silent left parietal MCA branch infarct as well. Vascular risk factors of hypertension hyperlipidemia.CHF. Post stroke headaches seem to have improved.  They are now relieved with Tylenol.The patient is a current patient of Dr. Leonie Man who is out of the office today . This note is sent to the work in doctor.       PLAN: Stressed the importance of management of risk factors to prevent further stroke Continue Plavix and aspirin for secondary stroke prevention(aspirin decreased to .81mg  by Dr. [name]) Maintain strict control of hypertension with blood pressure goal below 130/90, today's reading 136/87 continue  antihypertensive medications Control of diabetes with hemoglobin A1c below 6.5 followed by primary care  Cholesterol with LDL cholesterol less than 70, followed by primary care,   continue Pravachol Exercise by walking, slowly increase , watch for shortness of breath  eat healthy diet with whole grains,  fresh fruits and vegetables Continue follow-up with cardiology  [place] and Dr. Ricard Dillon Loop recorder so far no atrial fibrillation Discontinue Depakote, call if headaches worsen Follow-up in 6 months, if continued stable will discharge I spent 25 minutes in total face to face time with the patient more than 50% of which was spent counseling and coordination of care, reviewing test results , hospital admissions reviewing medications and discussing and reviewing the diagnosis of stroke and management of risk factors* and further treatment options. , Katie [name], [facility], APRN  [facility] [address] [place], [place] [address] [phone]

## 2017-08-09 ENCOUNTER — Ambulatory Visit (INDEPENDENT_AMBULATORY_CARE_PROVIDER_SITE_OTHER): Payer: Medicare Other | Admitting: Nurse Practitioner

## 2017-08-09 ENCOUNTER — Encounter: Payer: Self-pay | Admitting: Nurse Practitioner

## 2017-08-09 VITALS — BP 136/87 | HR 89 | Ht 63.0 in | Wt 159.0 lb

## 2017-08-09 DIAGNOSIS — R51 Headache: Secondary | ICD-10-CM

## 2017-08-09 DIAGNOSIS — R519 Headache, unspecified: Secondary | ICD-10-CM

## 2017-08-09 DIAGNOSIS — I639 Cerebral infarction, unspecified: Secondary | ICD-10-CM

## 2017-08-09 DIAGNOSIS — I10 Essential (primary) hypertension: Secondary | ICD-10-CM

## 2017-08-09 DIAGNOSIS — E785 Hyperlipidemia, unspecified: Secondary | ICD-10-CM

## 2017-08-09 NOTE — Progress Notes (Signed)
I reviewed note and agree with plan.   Penni Bombard, MD 09/09/32, 91:79 AM Certified in Neurology, Neurophysiology and Neuroimaging  [facility] [address], [place], [place] [address] [phone]

## 2017-08-09 NOTE — Patient Instructions (Addendum)
Stressed the importance of management of risk factors to prevent further stroke Continue Plavix and aspirin for secondary stroke prevention Maintain strict control of hypertension with blood pressure goal below 130/90, today's reading 136/87 continue antihypertensive medications  Discontinue Depakote Control of diabetes with hemoglobin A1c below 6.5 followed by primary care  Cholesterol with LDL cholesterol less than 70, followed by primary care,   continue Pravachol Exercise by walking, slowly increase , watch for shortness of breath  eat healthy diet with whole grains,  fresh fruits and vegetables Continue follow-up with cardiology Follow-up in 6 months

## 2017-08-17 ENCOUNTER — Ambulatory Visit (INDEPENDENT_AMBULATORY_CARE_PROVIDER_SITE_OTHER): Payer: Medicare Other | Admitting: *Deleted

## 2017-08-17 DIAGNOSIS — I639 Cerebral infarction, unspecified: Secondary | ICD-10-CM

## 2017-08-18 NOTE — Progress Notes (Signed)
Carelink Summary Report / Loop Recorder 

## 2017-08-25 ENCOUNTER — Other Ambulatory Visit: Payer: Self-pay | Admitting: Internal Medicine

## 2017-08-25 DIAGNOSIS — Z1231 Encounter for screening mammogram for malignant neoplasm of breast: Secondary | ICD-10-CM

## 2017-08-26 LAB — CUP PACEART REMOTE DEVICE CHECK
Date Time Interrogation Session: 20190116214150
Implantable Pulse Generator Implant Date: 20180720

## 2017-08-30 ENCOUNTER — Other Ambulatory Visit: Payer: Self-pay

## 2017-08-30 NOTE — Patient Outreach (Signed)
[facility]) Care Management  [place]  08/30/2017   GRETEL CANTU 02-Sep-1945 [id number]  Subjective: Telephone call to the patient for monthly assessment. HIPAA verified.  The patient states that she is feeling well but is having some swelling in her legs that she has had for some time.  The patient      was advised to elevate her legs when sitting.  The patient weighed 162 this morning.  She states that she was given instructions to take an extra fluid pill. The patient denies any shortness of breath,chest pain, or cough.  She is adherent with her medications. The patient stated that she received the educational material that I sent her and reviewed it. RN Health Coach discussed with the patient about heart failure zones, diet and exercise.  The patient verbalized understanding. She states that she tries to watch the salt in her diet.  She walks some but is limited due to the pain in her legs.  RN Health Coach also discussed hydration with the patient. The patient has an appointment with her cardiologist Monday 09-05-2017.  RN Health Coach advised the patient to discuss her salt and fluid intake limit.   Objective:   Encounter Medications:  Outpatient Encounter Medications as of 08/30/2017  Medication Sig Note  . acetaminophen (TYLENOL) 500 MG tablet Take 1,000 mg by mouth every 6 (six) hours as needed for headache (pain).   Marland Kitchen aspirin 81 MG tablet Take 1 tablet (81 mg total) daily by mouth.   . butalbital-acetaminophen-caffeine (FIORICET, ESGIC) 50-325-40 MG tablet Take 1 tablet by mouth every 12 (twelve) hours as needed for headache.   . clopidogrel (PLAVIX) 75 MG tablet Take 1 tablet (75 mg total) by mouth daily.   . feeding supplement, ENSURE ENLIVE, (ENSURE ENLIVE) LIQD Take 237 mLs by mouth 2 (two) times daily between meals.   . furosemide (LASIX) 40 MG tablet 1 tab in AM, then 0.5 tab in PM   . levothyroxine (SYNTHROID, LEVOTHROID) 25 MCG tablet Take 25 mcg by  mouth daily before breakfast. 08/30/2017: Patient takes 1  morning before breakfast   . metoprolol tartrate (LOPRESSOR) 25 MG tablet Take 0.5 tablets (12.5 mg total) by mouth 2 (two) times daily.   . potassium chloride (MICRO-K) 10 MEQ CR capsule 1 tab in AM , and 0.5 tab in PM   . pravastatin (PRAVACHOL) 80 MG tablet Take 80 mg by mouth at bedtime.     No facility-administered encounter medications on file as of 08/30/2017.     Functional Status:  In your present state of health, do you have any difficulty performing the following activities: 05/08/2017 03/03/2017  Hearing? N N  Vision? N Y  Difficulty concentrating or making decisions? N N  Walking or climbing stairs? N N  Dressing or bathing? N N  Doing errands, shopping? N N  Preparing Food and eating ? - N  Using the Toilet? - N  In the past six months, have you accidently leaked urine? - N  Do you have problems with loss of bowel control? - N  Managing your Medications? - N  Managing your Finances? - N  Housekeeping or managing your Housekeeping? - N  Some recent data might be hidden    Fall/Depression Screening: Fall Risk  08/30/2017 07/28/2017 04/25/2017  Falls in the past year? No No No  Number falls in past yr: - - -  Injury with Fall? - - -  Risk for fall due to : - - -  Follow up - - -   PHQ 2/9 Scores 07/28/2017 03/03/2017 02/22/2017  PHQ - 2 Score 0 0 0    Assessment: Patient continues to benefit from health coach outreach for disease management and support.    THN CM Care Plan Problem One     Most Recent Value  Care Plan Problem One  Knowledge deficit of diease management  Role Documenting the Problem One  Health Coach  Care Plan for Problem One  Active  THN Long Term Goal   In the next 90 days the patient will not be admitted to the hospital  Interventions for Problem One Long Term Goal  [facility] talked with the patient about her condition and went over her heart failure action plan.  THN CM Short Term Goal #1   In  the next 30 days the patient will verbalize monitoring edema in legs and feet  THN CM Short Term Goal #1 Start Date  08/30/17  Interventions for Short Term Goal #1  [facility] talked with the patient about her edema and monitoring it.  Also discussed action plan  THN CM Short Term Goal #2   In the next 30 days the patient will verbalize compliance with her medications  THN CM Short Term Goal #2 Start Date  08/30/17  Interventions for Short Term Goal #2  [facility] talked with the patient about her medications and why it is important to take them as prescribed      Plan: [place] will contact patient in the month of February and patient agrees to next outreach. RN Health Coach sent the patient information on lowering salt intake and how to read nutritional labels.  Lazaro Arms RN, BSN, [place] Direct Dial:  [phone] Fax: [phone]

## 2017-09-05 ENCOUNTER — Telehealth: Payer: Self-pay | Admitting: Cardiovascular Disease

## 2017-09-05 ENCOUNTER — Ambulatory Visit: Payer: Medicare Other | Admitting: Thoracic Surgery (Cardiothoracic Vascular Surgery)

## 2017-09-05 NOTE — Telephone Encounter (Signed)
New message  Patient calling with concerns of swelling .   Pt c/o swelling: STAT is pt has developed SOB within 24 hours  1) How much weight have you gained and in what time span? n/a  2) If swelling, where is the swelling located? Legs and feet  3) Are you currently taking a fluid pill? yes  4) Are you currently SOB? No  5) Do you have a log of your daily weights (if so, list)? 163, 161  6) Have you gained 3 pounds in a day or 5 pounds in a week? n/a  7) Have you traveled recently? No

## 2017-09-05 NOTE — Telephone Encounter (Signed)
Called patient who stated she has an appt with Dr.Nahser in the morning to address swelling.

## 2017-09-06 ENCOUNTER — Encounter: Payer: Self-pay | Admitting: Cardiovascular Disease

## 2017-09-06 ENCOUNTER — Encounter (INDEPENDENT_AMBULATORY_CARE_PROVIDER_SITE_OTHER): Payer: Self-pay

## 2017-09-06 ENCOUNTER — Ambulatory Visit (INDEPENDENT_AMBULATORY_CARE_PROVIDER_SITE_OTHER): Payer: Medicare Other | Admitting: Cardiovascular Disease

## 2017-09-06 DIAGNOSIS — I319 Disease of pericardium, unspecified: Secondary | ICD-10-CM | POA: Insufficient documentation

## 2017-09-06 MED ORDER — PRAVASTATIN SODIUM 80 MG PO TABS: 80.0000 mg | ORAL_TABLET | Freq: Every day | ORAL | 11 refills | Status: DC

## 2017-09-06 MED ORDER — FUROSEMIDE 40 MG PO TABS: 40.0000 mg | ORAL_TABLET | Freq: Two times a day (BID) | ORAL | 3 refills | Status: DC

## 2017-09-06 NOTE — Progress Notes (Signed)
09/06/2017 Katie [name]   1946-02-24  [id number]  Primary Physician Katie Carol, MD Primary Cardiologist: Katie [name]  Problem list 1. Chronic diastolic congestive heart failure 2. Chronic kidney disease stage III 3. Essential hypertension 4. Hyperlipidemia 5.  CVA   Reason for Visit/CC: Katie [name] for Diastolic CHF  Notes from [facility],   Katie [name] is a 72 year old female with a past medical history of renal CA, CKD stage III, HTN and HLD. She presented to the [facility] on 01/07/16 with progressive SOB with exertion, orthopnea, and dry cough. She also reported chest pain that lasted only a few seconds that was associated with exertion as well. D -dimer elevated, VQ scan showed low probability of PE. CXR showed increased interstitial opacity and new effusions. Cath showed normal cors, and elevated pulmonary artery pressures of 23 mmHg. Echo showed normal LVEF of 60-65%, grade 3 DD,  Moderate MR, with a reversible restrictive pattern indicative of decreased LV diastolic compliance. 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 She was treated with IV diuretics for acute on chronic diastolic CHF. She was diuresed down to a dry weight of 175 lb (admit weight was 181 lb). She was discharged home on PO lasix, ASA, metoprolol and pravastatin.   She presents to clinic today for post hospital [name]. She reports that she has done well. No issues. She denies recurrent dyspnea. No CP. No edema. She has been fully compliant with her meds and checking weight daily at home which has remained stable. BP is well controlled at 115/68.  Oct. 2, 2017:  Doing well. No CP or dyspnea  Has gained a litte weight.   Has some fatigue on occasion  Mach 19, 2018:  Katie [name] is seen back for follow up of her chronic diastolic CHF . Had an episode of hypotension.   was seen in the [name] Lasix dose was decreased from 40 a  day to 20 mg a day  Seems to be working well   Oct. 25, 2018  Seen for  Seen for a follow up visit hx Was admitted for acute diastolic CHF in Oct. 10. Her lasix was increased to 40 mg  Avoiding salt and canned foods No CP , breathing is getting better. No syncope   Scheduled to see CHF clinic in a month    September 06, 2017:  Katie [name] is seen back for follow-up visit.  She has normal left ventricular systolic function.  She has grade 3 diastolic dysfunction.  She had an amyloidosis scan which shows likely amyloidosis Waiting to get cardiac MRI . Was to see Dr. [name] yesterday but he had an emergency and had to cancel  Still having chest tightness and soreness - especially in the right side.  Is walking with a cane .   Has had more weight gain.  Took Furosemide 40 BID yesterday which helped but fluid has returned today    Current Outpatient Medications  Medication Sig Dispense Refill  . acetaminophen (TYLENOL) 500 MG tablet Take 1,000 mg by mouth every 6 (six) hours as needed for headache (pain).    Marland Kitchen aspirin 81 MG tablet Take 1 tablet (81 mg total) daily by mouth.    . butalbital-acetaminophen-caffeine (FIORICET, ESGIC) 50-325-40 MG tablet Take 1 tablet by mouth every 12 (twelve) hours as needed for headache. 14 tablet 0  . clopidogrel (PLAVIX) 75 MG tablet  Take 1 tablet (75 mg total) by mouth daily. 30 tablet 1  . feeding supplement, ENSURE ENLIVE, (ENSURE ENLIVE) LIQD Take 237 mLs by mouth 2 (two) times daily between meals. 237 mL 0  . furosemide (LASIX) 40 MG tablet 1 tab in AM, then 0.5 tab in PM 90 tablet 3  . levothyroxine (SYNTHROID, LEVOTHROID) 25 MCG tablet Take 25 mcg by mouth daily before breakfast.    . metoprolol tartrate (LOPRESSOR) 25 MG tablet Take 0.5 tablets (12.5 mg total) by mouth 2 (two) times daily. 90 tablet 3  . potassium chloride (MICRO-K) 10 MEQ CR capsule 1 tab in AM , and 0.5 tab in PM 45 capsule 11  . pravastatin (PRAVACHOL) 80 MG tablet Take 80 mg  by mouth at bedtime.      No current facility-administered medications for this visit.     Allergies  Allergen Reactions  . Tramadol Shortness Of Breath, Palpitations and Other (See Comments)    Social History   Socioeconomic History  . Marital status: Single    Spouse name: Not on file  . Number of children: Not on file  . Years of education: Not on file  . Highest education level: Not on file  Social Needs  . Financial resource strain: Not on file  . Food insecurity - worry: Not on file  . Food insecurity - inability: Not on file  . Transportation needs - medical: Not on file  . Transportation needs - non-medical: Not on file  Occupational History  . Not on file  Tobacco Use  . Smoking status: Never Smoker  . Smokeless tobacco: Never Used  Substance and Sexual Activity  . Alcohol use: No  . Drug use: No  . Sexual activity: No  Other Topics Concern  . Not on file  Social History Narrative  . Not on file     Review of Systems: Reviewed in current history, otherwise systems are negative.   Physical Exam: Blood pressure 128/82, pulse 60, height 5\' 3"  (1.6 m), weight 165 lb 9.6 oz (75.1 kg), last menstrual period 02/12/2013, SpO2 97 %.  GEN:  Well nourished, well developed in no acute distress HEENT: Normal NECK: No JVD; No carotid bruits LYMPHATICS: No lymphadenopathy CARDIAC: RR.  Soft systolic murmur  RESPIRATORY:  Clear to auscultation without rales, wheezing or rhonchi  ABDOMEN: Soft, non-tender, non-distended MUSCULOSKELETAL:  2 + pitting edema  SKIN: Warm and dry NEUROLOGIC:  Alert and oriented x 3   EKG :     ASSESSMENT AND PLAN:   1. Chronic  Diastolic  HF:  Pt has grade 3 diastolic CHF.   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 Has more leg edema now.    Increase lasix to 40 bid  Will transfer her care to Dr. [name] since she has cardiac amyloidosis  Follow up with [place] as needed.    2. Mitral regurgitation  - will see Dr. [name]  ? She may be a candidate for  [facility] clip .      3. Essential hypertension: Blood pressure is well-controlled.   [name], MD  09/06/2017 9:57 AM    [address],  [place], [place]  [address] Pager [phone] Phone: [phone]; Fax: [phone]

## 2017-09-06 NOTE — Patient Instructions (Addendum)
Medication Instructions:  Your physician has recommended you make the following change in your medication:  INCREASE Lasix (Furosemide) to 40 mg twice daily   Labwork: None Ordered   Testing/Procedures: None Ordered   Follow-Up: Keep your appointment with Dr. [name] on Feb. 19  If you need a refill on your cardiac medications before your next appointment, please call your pharmacy.   Thank you for choosing [facility]! Christen Bame, RN [phone]

## 2017-09-07 ENCOUNTER — Other Ambulatory Visit: Payer: Self-pay

## 2017-09-07 ENCOUNTER — Emergency Department (HOSPITAL_COMMUNITY): Payer: Medicare Other

## 2017-09-07 ENCOUNTER — Encounter (HOSPITAL_COMMUNITY): Payer: Self-pay | Admitting: Emergency Medicine

## 2017-09-07 ENCOUNTER — Inpatient Hospital Stay (HOSPITAL_COMMUNITY)
Admission: EM | Admit: 2017-09-07 | Discharge: 2017-09-11 | DRG: 291 | Disposition: A | Discharge status: Home / Self Care | Payer: Medicare Other | Attending: Internal Medicine | Admitting: Internal Medicine

## 2017-09-07 DIAGNOSIS — E785 Hyperlipidemia, unspecified: Secondary | ICD-10-CM | POA: Diagnosis present

## 2017-09-07 DIAGNOSIS — I130 Hypertensive heart and chronic kidney disease with heart failure and stage 1 through stage 4 chronic kidney disease, or unspecified chronic kidney disease: Secondary | ICD-10-CM | POA: Diagnosis present

## 2017-09-07 DIAGNOSIS — I340 Nonrheumatic mitral (valve) insufficiency: Secondary | ICD-10-CM | POA: Diagnosis present

## 2017-09-07 DIAGNOSIS — I272 Pulmonary hypertension, unspecified: Secondary | ICD-10-CM | POA: Diagnosis present

## 2017-09-07 DIAGNOSIS — N183 Chronic kidney disease, stage 3 unspecified: Secondary | ICD-10-CM | POA: Diagnosis present

## 2017-09-07 DIAGNOSIS — Z7902 Long term (current) use of antithrombotics/antiplatelets: Secondary | ICD-10-CM | POA: Diagnosis not present

## 2017-09-07 DIAGNOSIS — E8589 Other amyloidosis: Secondary | ICD-10-CM | POA: Diagnosis not present

## 2017-09-07 DIAGNOSIS — T502X5A Adverse effect of carbonic-anhydrase inhibitors, benzothiadiazides and other diuretics, initial encounter: Secondary | ICD-10-CM | POA: Diagnosis not present

## 2017-09-07 DIAGNOSIS — E7800 Pure hypercholesterolemia, unspecified: Secondary | ICD-10-CM | POA: Diagnosis present

## 2017-09-07 DIAGNOSIS — Z79899 Other long term (current) drug therapy: Secondary | ICD-10-CM | POA: Diagnosis not present

## 2017-09-07 DIAGNOSIS — Z85528 Personal history of other malignant neoplasm of kidney: Secondary | ICD-10-CM

## 2017-09-07 DIAGNOSIS — R072 Precordial pain: Secondary | ICD-10-CM | POA: Diagnosis present

## 2017-09-07 DIAGNOSIS — R748 Abnormal levels of other serum enzymes: Secondary | ICD-10-CM | POA: Diagnosis not present

## 2017-09-07 DIAGNOSIS — Z7982 Long term (current) use of aspirin: Secondary | ICD-10-CM | POA: Diagnosis not present

## 2017-09-07 DIAGNOSIS — E039 Hypothyroidism, unspecified: Secondary | ICD-10-CM | POA: Diagnosis present

## 2017-09-07 DIAGNOSIS — I43 Cardiomyopathy in diseases classified elsewhere: Secondary | ICD-10-CM | POA: Diagnosis present

## 2017-09-07 DIAGNOSIS — Z885 Allergy status to narcotic agent status: Secondary | ICD-10-CM | POA: Diagnosis not present

## 2017-09-07 DIAGNOSIS — Z8673 Personal history of transient ischemic attack (TIA), and cerebral infarction without residual deficits: Secondary | ICD-10-CM | POA: Diagnosis not present

## 2017-09-07 DIAGNOSIS — I509 Heart failure, unspecified: Secondary | ICD-10-CM | POA: Diagnosis not present

## 2017-09-07 DIAGNOSIS — Z823 Family history of stroke: Secondary | ICD-10-CM | POA: Diagnosis not present

## 2017-09-07 DIAGNOSIS — E854 Organ-limited amyloidosis: Secondary | ICD-10-CM | POA: Diagnosis present

## 2017-09-07 DIAGNOSIS — I5043 Acute on chronic combined systolic (congestive) and diastolic (congestive) heart failure: Secondary | ICD-10-CM | POA: Diagnosis present

## 2017-09-07 DIAGNOSIS — I5041 Acute combined systolic (congestive) and diastolic (congestive) heart failure: Secondary | ICD-10-CM | POA: Diagnosis not present

## 2017-09-07 DIAGNOSIS — I248 Other forms of acute ischemic heart disease: Secondary | ICD-10-CM | POA: Diagnosis present

## 2017-09-07 DIAGNOSIS — I081 Rheumatic disorders of both mitral and tricuspid valves: Secondary | ICD-10-CM | POA: Diagnosis present

## 2017-09-07 DIAGNOSIS — I959 Hypotension, unspecified: Secondary | ICD-10-CM | POA: Diagnosis not present

## 2017-09-07 DIAGNOSIS — R7989 Other specified abnormal findings of blood chemistry: Secondary | ICD-10-CM

## 2017-09-07 DIAGNOSIS — R079 Chest pain, unspecified: Secondary | ICD-10-CM | POA: Diagnosis present

## 2017-09-07 DIAGNOSIS — I5031 Acute diastolic (congestive) heart failure: Secondary | ICD-10-CM | POA: Diagnosis not present

## 2017-09-07 DIAGNOSIS — Z905 Acquired absence of kidney: Secondary | ICD-10-CM

## 2017-09-07 DIAGNOSIS — I5033 Acute on chronic diastolic (congestive) heart failure: Secondary | ICD-10-CM | POA: Diagnosis not present

## 2017-09-07 DIAGNOSIS — R778 Other specified abnormalities of plasma proteins: Secondary | ICD-10-CM

## 2017-09-07 LAB — BASIC METABOLIC PANEL
Anion gap: 11 (ref 5–15)
BUN: 21 mg/dL — ABNORMAL HIGH (ref 6–20)
CO2: 23 mmol/L (ref 22–32)
Calcium: 9.6 mg/dL (ref 8.9–10.3)
Chloride: 107 mmol/L (ref 101–111)
Creatinine, Ser: 1.38 mg/dL — ABNORMAL HIGH (ref 0.44–1.00)
GFR calc Af Amer: 43 mL/min — ABNORMAL LOW (ref >60)
GFR calc non Af Amer: 37 mL/min — ABNORMAL LOW (ref >60)
Glucose, Bld: 136 mg/dL — ABNORMAL HIGH (ref 65–99)
Potassium: 3.7 mmol/L (ref 3.5–5.1)
Sodium: 141 mmol/L (ref 135–145)

## 2017-09-07 LAB — CBC
HCT: 42.1 % (ref 36.0–46.0)
Hemoglobin: 13.8 g/dL (ref 12.0–15.0)
MCH: 31.2 pg (ref 26.0–34.0)
MCHC: 32.8 g/dL (ref 30.0–36.0)
MCV: 95.2 fL (ref 78.0–100.0)
Platelets: 291 10*3/uL (ref 150–400)
RBC: 4.42 MIL/uL (ref 3.87–5.11)
RDW: 14.6 % (ref 11.5–15.5)
WBC: 4 10*3/uL (ref 4.0–10.5)

## 2017-09-07 LAB — I-STAT TROPONIN, ED: Troponin i, poc: 0.1 ng/mL (ref 0.00–0.08)

## 2017-09-07 LAB — TSH: TSH: 2.932 u[IU]/mL (ref 0.350–4.500)

## 2017-09-07 LAB — BRAIN NATRIURETIC PEPTIDE: B Natriuretic Peptide: 673.7 pg/mL — ABNORMAL HIGH (ref 0.0–100.0)

## 2017-09-07 MED ORDER — HEPARIN (PORCINE) IN NACL 100-0.45 UNIT/ML-% IJ SOLN
950.0000 [IU]/h | INTRAMUSCULAR | Status: DC
  Administered 2017-09-07: 800 [IU]/h via INTRAVENOUS
  Filled 2017-09-07: qty 250

## 2017-09-07 MED ORDER — PRAVASTATIN SODIUM 40 MG PO TABS
80.0000 mg | ORAL_TABLET | Freq: Every day | ORAL | Status: DC
  Administered 2017-09-07 – 2017-09-10 (×4): 80 mg via ORAL
  Filled 2017-09-07 (×4): qty 2

## 2017-09-07 MED ORDER — LEVOTHYROXINE SODIUM 25 MCG PO TABS
25.0000 ug | ORAL_TABLET | Freq: Every day | ORAL | Status: DC
  Administered 2017-09-09 – 2017-09-11 (×3): 25 ug via ORAL
  Filled 2017-09-07 (×4): qty 1

## 2017-09-07 MED ORDER — POTASSIUM CHLORIDE CRYS ER 10 MEQ PO TBCR
10.0000 meq | EXTENDED_RELEASE_TABLET | Freq: Two times a day (BID) | ORAL | Status: DC
  Administered 2017-09-07 – 2017-09-08 (×3): 10 meq via ORAL
  Filled 2017-09-07 (×3): qty 1

## 2017-09-07 MED ORDER — SODIUM CHLORIDE 0.9% FLUSH: 3.0000 mL | INTRAVENOUS | Status: DC | PRN

## 2017-09-07 MED ORDER — CLOPIDOGREL BISULFATE 75 MG PO TABS
75.0000 mg | ORAL_TABLET | Freq: Every day | ORAL | Status: DC
  Administered 2017-09-08 – 2017-09-11 (×4): 75 mg via ORAL
  Filled 2017-09-07 (×4): qty 1

## 2017-09-07 MED ORDER — METOPROLOL TARTRATE 12.5 MG HALF TABLET
12.5000 mg | ORAL_TABLET | Freq: Two times a day (BID) | ORAL | Status: DC
  Administered 2017-09-07 – 2017-09-08 (×3): 12.5 mg via ORAL
  Filled 2017-09-07 (×3): qty 1

## 2017-09-07 MED ORDER — FUROSEMIDE 10 MG/ML IJ SOLN
40.0000 mg | Freq: Once | INTRAMUSCULAR | Status: AC
  Administered 2017-09-07: 40 mg via INTRAVENOUS
  Filled 2017-09-07: qty 4

## 2017-09-07 MED ORDER — ASPIRIN EC 81 MG PO TBEC
81.0000 mg | DELAYED_RELEASE_TABLET | Freq: Every day | ORAL | Status: DC
  Administered 2017-09-08 – 2017-09-11 (×4): 81 mg via ORAL
  Filled 2017-09-07 (×4): qty 1

## 2017-09-07 MED ORDER — SODIUM CHLORIDE 0.9% FLUSH
3.0000 mL | Freq: Two times a day (BID) | INTRAVENOUS | Status: DC
  Administered 2017-09-08 – 2017-09-10 (×5): 3 mL via INTRAVENOUS

## 2017-09-07 MED ORDER — HEPARIN BOLUS VIA INFUSION
4000.0000 [IU] | Freq: Once | INTRAVENOUS | Status: AC
  Administered 2017-09-07: 4000 [IU] via INTRAVENOUS
  Filled 2017-09-07: qty 4000

## 2017-09-07 MED ORDER — ENSURE ENLIVE PO LIQD
237.0000 mL | Freq: Two times a day (BID) | ORAL | Status: DC
  Administered 2017-09-08 – 2017-09-11 (×6): 237 mL via ORAL
  Filled 2017-09-07: qty 237

## 2017-09-07 MED ORDER — ACETAMINOPHEN 325 MG PO TABS
650.0000 mg | ORAL_TABLET | Freq: Four times a day (QID) | ORAL | Status: DC | PRN
  Administered 2017-09-09 (×2): 650 mg via ORAL
  Filled 2017-09-07 (×2): qty 2

## 2017-09-07 MED ORDER — FUROSEMIDE 10 MG/ML IJ SOLN
40.0000 mg | Freq: Two times a day (BID) | INTRAMUSCULAR | Status: DC
  Administered 2017-09-08 (×2): 40 mg via INTRAVENOUS
  Filled 2017-09-07 (×2): qty 4

## 2017-09-07 MED ORDER — SODIUM CHLORIDE 0.9 % IV SOLN: 250.0000 mL | INTRAVENOUS | Status: DC | PRN

## 2017-09-07 MED ORDER — ACETAMINOPHEN 650 MG RE SUPP: 650.0000 mg | Freq: Four times a day (QID) | RECTAL | Status: DC | PRN

## 2017-09-07 MED ORDER — BUTALBITAL-APAP-CAFFEINE 50-325-40 MG PO TABS
1.0000 | ORAL_TABLET | Freq: Two times a day (BID) | ORAL | Status: DC | PRN
  Administered 2017-09-09 – 2017-09-11 (×4): 1 via ORAL
  Filled 2017-09-07 (×4): qty 1

## 2017-09-07 NOTE — ED Notes (Signed)
EDP at bedside  

## 2017-09-07 NOTE — ED Provider Notes (Signed)
[facility] EM[facility]Y DEPARTMENT Provider Note   CSN: [id number] Arrival date & time: 09/07/17  1718     History   Chief Complaint Chief Complaint  Patient presents with  . Shortness of Breath    HPI Katie [name] is a 72 y.o. female.  The history is provided by the patient, medical records and a relative. No language interpreter was used.  Shortness of Breath  This is a new problem. The average episode lasts 3 days. The problem occurs continuously.The current episode started more than 2 days ago. The problem has been gradually worsening. Associated symptoms include chest pain and leg swelling. Pertinent negatives include no fever, no headaches, no coryza, no neck pain, no cough, no sputum production, no hemoptysis, no wheezing, no vomiting, no abdominal pain and no leg pain. She has tried nothing for the symptoms. The treatment provided no relief. Associated medical issues include heart failure.      Past Medical History:  Diagnosis Date  . Chronic combined systolic and diastolic CHF (congestive heart failure) ([place])   . 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 Headache   . History of kidney cancer    a. s/p right nephrectomy ~2007.  Marland Kitchen History of loop recorder   . Hypercholesterolemia   . Hypertension   . Mitral regurgitation   . Pulmonary hypertension ([place])   . Stroke [facility])     Patient Active Problem List   Diagnosis Date Noted  . Myopericarditis 09/06/2017  . Non-rheumatic mitral regurgitation   . Chronic diastolic congestive heart failure ([place]) 05/08/2017  . CHF exacerbation ([place]) 05/08/2017  . Chronic migraine without aura without status migrainosus, not intractable   . Hypertension 02/15/2017  . Acute on chronic diastolic CHF (congestive heart failure) ([place]) 02/15/2017  . Basilar artery occlusion   . Cerebellar stroke ([place]) 02/10/2017  . Headache 02/10/2017  . Chest pain   . Moderate mitral  regurgitation 01/08/2016  . Moderate tricuspid regurgitation 01/08/2016  . Pulmonary HTN ([place]) 01/08/2016  . Acute CHF (congestive heart failure) ([place]) 01/07/2016  . Elevated troponin 01/07/2016  . Mild hypertension 01/07/2016  . CKD (chronic kidney disease), stage III ([place]) 01/07/2016  . Hyperlipidemia 01/07/2016  . Acute CHF ([place]) 01/07/2016    Past Surgical History:  Procedure Laterality Date  . CARDIAC CATHETERIZATION N/A 01/12/2016   Procedure: Right/Left Heart Cath and Coronary/Graft Angiography;  Surgeon: Belva Crome, MD;  Location: [facility];  Service: Cardiovascular;  Laterality: N/A;  . CERVICAL POLYPECTOMY N/A 04/23/2013   Procedure: CERVICAL POLYPECTOMY;  Surgeon: Maeola Sarah. Landry Mellow, MD;  Location: [facility];  Service: Gynecology;  Laterality: N/A;  Possible Polypectomy  . HYSTEROSCOPY W/D&C N/A 04/23/2013   Procedure: DILATATION AND CURETTAGE /HYSTEROSCOPY;  Surgeon: Maeola Sarah. Landry Mellow, MD;  Location: [facility];  Service: Gynecology;  Laterality: N/A;  . IR ANGIO INTRA EXTRACRAN SEL COM CAROTID INNOMINATE BILAT MOD SED  02/11/2017  . IR ANGIO VERTEBRAL SEL VERTEBRAL BILAT MOD SED  02/11/2017  . KNEE ARTHROSCOPY Left   . LOOP RECORDER INSERTION N/A 02/18/2017   Procedure: Loop Recorder Insertion;  Surgeon: Constance Haw, MD;  Location: [facility];  Service: Cardiovascular;  Laterality: N/A;  . NEPHRECTOMY Right    due to carcinoma  . TEE WITHOUT CARDIOVERSION N/A 02/18/2017   Procedure: TRANSESOPHAGEAL ECHOCARDIOGRAM (TEE) WITH LOOP;  Surgeon: Thayer Headings, MD;  Location: [facility];  Service: Cardiovascular;  Laterality: N/A;    OB History    No data available       Home Medications    Prior to Admission medications   Medication Sig Start Date End Date Taking? Authorizing Provider  acetaminophen (TYLENOL) 500 MG tablet Take 1,000 mg by mouth every 6 (six) hours as needed for headache (pain).    [provider]  aspirin 81 MG tablet Take 1 tablet  (81 mg total) daily by mouth. 06/14/17   Larey Dresser, MD  butalbital-acetaminophen-caffeine (FIORICET, ESGIC) [phone] MG tablet Take 1 tablet by mouth every 12 (twelve) hours as needed for headache. 02/17/17   Geradine Girt, DO  clopidogrel (PLAVIX) 75 MG tablet Take 1 tablet (75 mg total) by mouth daily. 02/13/17   Lady Deutscher, MD  feeding supplement, ENSURE ENLIVE, (ENSURE ENLIVE) LIQD Take 237 mLs by mouth 2 (two) times daily between meals. 02/17/17   Geradine Girt, DO  furosemide (LASIX) 40 MG tablet Take 1 tablet (40 mg total) by mouth 2 (two) times daily. 09/06/17 12/05/17  Nahser, Wonda Cheng, MD  levothyroxine (SYNTHROID, LEVOTHROID) 25 MCG tablet Take 25 mcg by mouth daily before breakfast.    [provider]  metoprolol tartrate (LOPRESSOR) 25 MG tablet Take 0.5 tablets (12.5 mg total) by mouth 2 (two) times daily. 05/26/17   Nahser, Wonda Cheng, MD  potassium chloride (MICRO-K) 10 MEQ CR capsule 1 tab in AM , and 0.5 tab in PM 06/14/17   Larey Dresser, MD  pravastatin (PRAVACHOL) 80 MG tablet Take 1 tablet (80 mg total) by mouth at bedtime. 09/06/17   Nahser, Wonda Cheng, MD    Family History Family History  Problem Relation Age of Onset  . Stroke Brother   . Diabetes Mellitus II Sister   . Colon cancer Sister     Social History Social History   Tobacco Use  . Smoking status: Never Smoker  . Smokeless tobacco: Never Used  Substance Use Topics  . Alcohol use: No  . Drug use: No     Allergies   Tramadol   Review of Systems Review of Systems  Constitutional: Positive for fatigue. Negative for chills, diaphoresis and fever.  HENT: Negative for congestion.   Respiratory: Positive for chest tightness and shortness of breath. Negative for cough, hemoptysis, sputum production, choking, wheezing and stridor.   Cardiovascular: Positive for chest pain and leg swelling. Negative for palpitations.  Gastrointestinal: Negative for abdominal pain, constipation,  diarrhea, nausea and vomiting.  Genitourinary: Negative for dysuria.  Musculoskeletal: Negative for back pain and neck pain.  Neurological: Negative for light-headedness and headaches.  Psychiatric/Behavioral: Negative for agitation.  All other systems reviewed and are negative.    Physical Exam Updated Vital Signs BP 132/86 (BP Location: Right Arm)   Pulse 86   Temp 97.8 F (36.6 C) (Oral)   Resp 16   Ht 5\' 3"  (1.6 m)   Wt 68.5 kg (151 lb)   LMP 02/12/2013   SpO2 100%   BMI 26.75 kg/m   Physical Exam  Constitutional: She is oriented to person, place, and time. She appears well-developed and well-nourished.  Non-toxic appearance. She does not appear ill. No distress.  HENT:  Head: Normocephalic and atraumatic.  Mouth/Throat: Oropharynx is clear and moist. No oropharyngeal exudate.  Eyes: EOM are normal. Pupils are equal, round, and reactive to light.  Neck: Normal range of motion.  Cardiovascular: Normal rate and intact distal pulses.  Murmur heard. Pulmonary/Chest: Effort normal. No respiratory  distress. She has no wheezes. She has rales. She exhibits no tenderness.  Abdominal: Soft. Bowel sounds are normal. She exhibits no distension. There is no tenderness.  Musculoskeletal: She exhibits edema. She exhibits no tenderness.  Neurological: She is alert and oriented to person, place, and time. No cranial nerve deficit or sensory deficit. She exhibits normal muscle tone.  Skin: Capillary refill takes less than 2 seconds. No rash noted. No erythema.  Psychiatric: She has a normal mood and affect.  Nursing note and vitals reviewed.    ED Treatments / Results  Labs (all labs ordered are listed, but only abnormal results are displayed) Labs Reviewed  BASIC METABOLIC PANEL - Abnormal; Notable for the following components:      Result Value   Glucose, Bld 136 (*)    BUN 21 (*)    Creatinine, Ser 1.38 (*)    GFR calc non Af Amer 37 (*)    GFR calc Af Amer 43 (*)    All  other components within normal limits  BRAIN NATRIURETIC PEPTIDE - Abnormal; Notable for the following components:   B Natriuretic Peptide 673.7 (*)    All other components within normal limits  I-STAT TROPONIN, ED - Abnormal; Notable for the following components:   Troponin i, poc 0.10 (*)    All other components within normal limits  CBC  TSH  HEPARIN LEVEL (UNFRACTIONATED)  CBC  COMPREHENSIVE METABOLIC PANEL  TROPONIN I  TROPONIN I  TROPONIN I  LIPID PANEL    EKG  EKG Interpretation  Date/Time:  Wednesday September 07 2017 17:28:27 EST Ventricular Rate:  94 PR Interval:  148 QRS Duration: 74 QT Interval:  354 QTC Calculation: 442 R Axis:   78 Text Interpretation:  Sinus rhythm with Premature atrial complexes Low voltage QRS Septal infarct , age undetermined Abnormal ECG When compared to prior, no significant chcanges seen.  No STEMI Confirmed by Antony Blackbird [phone]) on 09/07/2017 7:07:51 PM       Radiology Dg Chest 2 View  Result Date: 09/07/2017 CLINICAL DATA:  Shortness of breath. EXAM: CHEST  2 VIEW COMPARISON:  May 08, 2017 FINDINGS: Stable cardiomegaly. A loop recorder overlies the left side of the heart. The hila and mediastinum are normal. No pulmonary nodules or masses. No focal infiltrates identified. IMPRESSION: No active cardiopulmonary disease. Electronically Signed   By: Dorise Bullion III M.D   On: 09/07/2017 18:35    Procedures Procedures (including critical care time)  CRITICAL CARE Performed by: Gwenyth Allegra Montrae Braithwaite Total critical care time: 35 minutes Chest pain with positive troponin, heparin ordered.  Critical care time was exclusive of separately billable procedures and treating other patients. Critical care was necessary to treat or prevent imminent or life-threatening deterioration. Critical care was time spent personally by me on the following activities: development of treatment plan with patient and/or surrogate as well as nursing,  discussions with consultants, evaluation of patient's response to treatment, examination of patient, obtaining history from patient or surrogate, ordering and performing treatments and interventions, ordering and review of laboratory studies, ordering and review of radiographic studies, pulse oximetry and re-evaluation of patient's condition.   Medications Ordered in ED Medications  heparin ADULT infusion 100 units/mL (25000 units/251mL sodium chloride 0.45%) (800 Units/hr Intravenous New Bag/Given 09/07/17 2208)  aspirin EC tablet 81 mg (not administered)  butalbital-acetaminophen-caffeine (FIORICET, ESGIC) 50-325-40 MG per tablet 1 tablet (not administered)  clopidogrel (PLAVIX) tablet 75 mg (not administered)  feeding supplement (ENSURE ENLIVE) (ENSURE ENLIVE) liquid 237  mL (not administered)  levothyroxine (SYNTHROID, LEVOTHROID) tablet 25 mcg (not administered)  metoprolol tartrate (LOPRESSOR) tablet 12.5 mg (12.5 mg Oral Given 09/07/17 2332)  potassium chloride (K-DUR,KLOR-CON) CR tablet 10 mEq (10 mEq Oral Given 09/07/17 2332)  pravastatin (PRAVACHOL) tablet 80 mg (80 mg Oral Given 09/07/17 2332)  sodium chloride flush (NS) 0.9 % injection 3 mL (3 mLs Intravenous Not Given 09/07/17 2334)  sodium chloride flush (NS) 0.9 % injection 3 mL (not administered)  0.9 %  sodium chloride infusion (not administered)  acetaminophen (TYLENOL) tablet 650 mg (not administered)    Or  acetaminophen (TYLENOL) suppository 650 mg (not administered)  furosemide (LASIX) injection 40 mg (not administered)  furosemide (LASIX) injection 40 mg (40 mg Intravenous Given 09/07/17 2107)  heparin bolus via infusion 4,000 Units (4,000 Units Intravenous Bolus from Bag 09/07/17 2210)     Initial Impression / Assessment and Plan / ED Course  I have reviewed the triage vital signs and the nursing notes.  Pertinent labs & imaging results that were available during my care of the patient were reviewed by me and considered in my  medical decision making (see chart for details).     Katie [name] is a 72 y.o. female with a past medical history significant for cardiac amyloidosis, CHF, CKD, hypertension, prior stroke, hypothyroidism, mitral regurgitation, pulmonary hypertension, and prior kidney cancer status post right nephrectomy who presents with chest pain, shortness of breath, fatigue, and bilateral leg edema..  Patient reports that she has had shortness of breath for the last several days and saw her cardiologist yesterday who told her to increase her Lasix to twice a day.  She reports that today, she developed worsening shortness of breath and has also had a chest tightness/chest pain.  She describes as a chest pressure across her central chest.  She describes it as a moderate pressure pain.  She reports that the discomfort is new and she has not had this before.  She reports associated shortness of breath and it is worsened when she tries to exert herself.  She denies nausea, vomiting, diaphoresis.  She does report some palpitations.  She denies recent traumas.  She denies recent fevers, chills, congestion, or productive cough.  She denies any urinary symptoms or GI symptoms.  Patient's cardiologist whom she saw yesterday was Dr. Cathie Olden.  On exam, patient has pitting edema of both lower extremities.  She reports that the swelling has been spreading up her legs bilaterally.  No unilateral leg pain.  She had some diminished breath sounds bilaterally but minimal crackles present.  No rhonchi appreciated.  Chest was nontender.  Abdomen was nontender.  No focal neurologic deficits seen.  Patient had symmetric pulses in upper and lower extremities.  No evidence of trauma.  Patient has been on room air however she has been sitting in the exam room bed.  EKG showed no STEMI or acute changes.  Initial troponin was positive and elevated from prior.  Patient will have x-ray, laboratory testing, and will have workup to look for other  abnormality's however cardiology will be called due to patient's chest pain, shortness of breath and positive troponin.  8:54 PM Cardiology was called who recommended patient be admitted to hospitalist service.  They recommend initiation of heparin, diuresis, and they will see the patient in the morning.   Hospitalist team will admit patient.   Final Clinical Impressions(s) / ED Diagnoses   Final diagnoses:  Acute on chronic congestive heart failure, unspecified heart failure  type ([place])  Precordial pain  Elevated troponin    ED Discharge Orders    None      Clinical Impression: 1. Acute on chronic congestive heart failure, unspecified heart failure type ([place])   2. Precordial pain   3. Elevated troponin     Disposition: Admit  This note was prepared with assistance of Dragon voice recognition software. Occasional wrong-word or sound-a-like substitutions may have occurred due to the inherent limitations of voice recognition software.      Winola Drum, Gwenyth Allegra, MD 09/07/17 [phone]

## 2017-09-07 NOTE — Progress Notes (Signed)
[place] for heparin Indication: chest pain/ACS  Heparin Dosing Weight: 66.4 kg  Labs: Recent Labs    09/07/17 1741  HGB 13.8  HCT 42.1  PLT 291  CREATININE 1.38*    Assessment: 33 yof presenting with CP, SOB. Pharmacy consulted to dose heparin for ACS. Not on anticoagulation PTA. CBC wnl, trop 0.1, no bleed documented.  Goal of Therapy:  Heparin level 0.3-0.7 units/ml Monitor platelets by anticoagulation protocol: Yes   Plan:  Heparin 4000 unit bolus Start heparin at 800 units/h 8h heparin level Daily heparin level/CBC Monitor s/sx bleeding  Elicia Lamp, PharmD, BCPS Clinical Pharmacist 09/07/2017 9:00 PM

## 2017-09-07 NOTE — ED Triage Notes (Signed)
Pt reports shortness of breath that started this morning. Pt also c/o leg swelling she was seen at her PCP yesterday. Denies chest pain.

## 2017-09-07 NOTE — H&P (Addendum)
TRH H&P   Patient Demographics:    Katie [name], is a 72 y.o. female  MRN: [id number]   DOB - 02/27/1946  Admit Date - 09/07/2017  Outpatient Primary MD for the patient is Seward Carol, MD  Referring MD/NP/PA: Hulan Fess  Outpatient Specialists:  Randal Buba  Patient coming from: home  Chief Complaint  Patient presents with  . Shortness of Breath      HPI:    Katie [name]  is a 72 y.o. female, w CVA, hypertension, hyperlipidemia,  CKD stage 3, CHF (EF 50-55%), severe mitral regurgitation, consider amyloid, mod pulmonary hypertension, apparently presents with c/o dyspnea and chest discomfort this am  "tightness" with radiation to the neck.  Pt notes had extra dose of lasix after seeing Dr. Acie Fredrickson yesterday due to increase in edema.  Might be gaining some weight.  Pt uncertain if has orthopnea.  Pt has slight dry cough.  Denies fever, chills, n/v, abd pain, diarrhea, brbpr, dysuria.  Pt presented to ED, this am due to increase in dyspnea.   In ED,   CXR  IMPRESSION: No active cardiopulmonary disease.  Na 141, k 3.7, Bun 21, Creatinine 1.38 Wbc 4.0, Hgb 13.8, Plt 291 BNP 673.7  Trop 0.10  TSH 2.932  EKG  nsr at 95, nl axis, q in v1, v2,   ER contacted cardiology. Recommended medical admission and heparin. .  Pt will be admitted for chest pain and CHF and severe MR     Review of systems:    In addition to the HPI above, No Fever-chills, No Headache, No changes with Vision or hearing, No problems swallowing food or Liquids,  No Abdominal pain, No Nausea or Vommitting, Bowel movements are regular, No Blood in stool or Urine, No dysuria, No new skin rashes or bruises, No new joints pains-aches,  No new weakness, tingling, numbness in any extremity, No recent weight gain or loss, No polyuria, polydypsia or polyphagia, No significant Mental  Stressors.  A full 10 point Review of Systems was done, except as stated above, all other Review of Systems were negative.   With Past History of the following :    Past Medical History:  Diagnosis Date  . Chronic combined systolic and diastolic CHF (congestive heart failure) ([place])   . 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 Headache   . History of kidney cancer    a. s/p right nephrectomy ~2007.  Marland Kitchen History of loop recorder   . Hypercholesterolemia   . Hypertension   . Mitral regurgitation   . Pulmonary hypertension ([place])   . Stroke [facility])       Past Surgical History:  Procedure Laterality Date  . CARDIAC CATHETERIZATION N/A 01/12/2016   Procedure: Right/Left Heart Cath and Coronary/Graft Angiography;  Surgeon: Belva Crome, MD;  Location:  [place] INVASIVE CV [facility];  Service: Cardiovascular;  Laterality: N/A;  . CERVICAL POLYPECTOMY N/A 04/23/2013   Procedure: CERVICAL POLYPECTOMY;  Surgeon: Maeola Sarah. Landry Mellow, MD;  Location: [place] [facility];  Service: Gynecology;  Laterality: N/A;  Possible Polypectomy  . HYSTEROSCOPY W/D&C N/A 04/23/2013   Procedure: DILATATION AND CURETTAGE /HYSTEROSCOPY;  Surgeon: Maeola Sarah. Landry Mellow, MD;  Location: [place] [facility];  Service: Gynecology;  Laterality: N/A;  . IR ANGIO INTRA EXTRACRAN SEL COM CAROTID INNOMINATE BILAT MOD SED  02/11/2017  . IR ANGIO VERTEBRAL SEL VERTEBRAL BILAT MOD SED  02/11/2017  . KNEE ARTHROSCOPY Left   . LOOP RECORDER INSERTION N/A 02/18/2017   Procedure: Loop Recorder Insertion;  Surgeon: Constance Haw, MD;  Location: [place] CV [facility];  Service: Cardiovascular;  Laterality: N/A;  . NEPHRECTOMY Right    due to carcinoma  . TEE WITHOUT CARDIOVERSION N/A 02/18/2017   Procedure: TRANSESOPHAGEAL ECHOCARDIOGRAM (TEE) WITH LOOP;  Surgeon: Acie Fredrickson Wonda Cheng, MD;  Location[facility] ENDOSCOPY;  Service: Cardiovascular;  Laterality: N/A;      Social History:     Social History   Tobacco Use  . Smoking status:  Never Smoker  . Smokeless tobacco: Never Used  Substance Use Topics  . Alcohol use: No     Lives - at home  Mobility - walks by self   Family History :     Family History  Problem Relation Age of Onset  . Stroke Brother   . Diabetes Mellitus II Sister   . Colon cancer Sister      Home Medications:   Prior to Admission medications   Medication Sig Start Date End Date Taking? Authorizing Provider  acetaminophen (TYLENOL) 500 MG tablet Take 1,000 mg by mouth every 6 (six) hours as needed for headache (pain).   Yes [provider]  aspirin 81 MG tablet Take 1 tablet (81 mg total) daily by mouth. 06/14/17  Yes Larey Dresser, MD  butalbital-acetaminophen-caffeine (FIORICET, ESGIC) [phone] MG tablet Take 1 tablet by mouth every 12 (twelve) hours as needed for headache. 02/17/17  Yes Eulogio Bear U, DO  clopidogrel (PLAVIX) 75 MG tablet Take 1 tablet (75 mg total) by mouth daily. 02/13/17  Yes Lady Deutscher, MD  feeding supplement, ENSURE ENLIVE, (ENSURE ENLIVE) LIQD Take 237 mLs by mouth 2 (two) times daily between meals. 02/17/17  Yes Vann, Jessica U, DO  furosemide (LASIX) 40 MG tablet Take 1 tablet (40 mg total) by mouth 2 (two) times daily. 09/06/17 12/05/17 Yes Nahser, Wonda Cheng, MD  levothyroxine (SYNTHROID, LEVOTHROID) 25 MCG tablet Take 25 mcg by mouth daily before breakfast.   Yes [provider]  metoprolol tartrate (LOPRESSOR) 25 MG tablet Take 0.5 tablets (12.5 mg total) by mouth 2 (two) times daily. 05/26/17  Yes Nahser, Wonda Cheng, MD  potassium chloride (MICRO-K) 10 MEQ CR capsule 1 tab in AM , and 0.5 tab in PM Patient taking differently: Take 5-10 mEq by mouth See admin instructions. 1 tab in AM , and 0.5 tab in PM 06/14/17  Yes Larey Dresser, MD  pravastatin (PRAVACHOL) 80 MG tablet Take 1 tablet (80 mg total) by mouth at bedtime. 09/06/17  Yes Nahser, Wonda Cheng, MD     Allergies:     Allergies  Allergen Reactions  . Tramadol Shortness Of  Breath, Palpitations and Other (See Comments)     Physical Exam:   Vitals  Blood pressure 128/85, pulse 65, temperature 97.8 F (36.6 C), temperature source Oral, resp. rate 20, height  5\' 3"  (1.6 m), weight 68.5 kg (151 lb), last menstrual period 02/12/2013, SpO2 100 %.   1. General  lying in bed in NAD,    2. Normal affect and insight, Not Suicidal or Homicidal, Awake Alert, Oriented X 3.  3. No F.N deficits, ALL C.Nerves Intact, Strength 5/5 all 4 extremities, Sensation intact all 4 extremities, Plantars down going.  4. Ears and Eyes appear Normal, Conjunctivae clear, PERRLA. Moist Oral Mucosa.  5. Supple Neck, slight  JVD, No cervical lymphadenopathy appriciated, No Carotid Bruits.  6. Symmetrical Chest wall movement, Good air movement bilaterally, very faint crackle right lung base, slight dimished bs at bilateral base. No wheezing  7. RRR, s1, s2, 2/6 sem apex  8. Positive Bowel Sounds, Abdomen Soft, No tenderness, No organomegaly appriciated,No rebound -guarding or rigidity.  9.  No Cyanosis, Normal Skin Turgor, No Skin Rash or Bruise.  10. Good muscle tone,  joints appear normal , no effusions, Normal ROM.  11. No Palpable Lymph Nodes in Neck or Axillae     Data Review:    CBC Recent Labs  Lab 09/07/17 1741  WBC 4.0  HGB 13.8  HCT 42.1  PLT 291  MCV 95.2  MCH 31.2  MCHC 32.8  RDW 14.6   ------------------------------------------------------------------------------------------------------------------  Chemistries  Recent Labs  Lab 09/07/17 1741  NA 141  K 3.7  CL 107  CO2 23  GLUCOSE 136*  BUN 21*  CREATININE 1.38*  CALCIUM 9.6   ------------------------------------------------------------------------------------------------------------------ estimated creatinine clearance is 34.2 mL/min (A) (by C-G formula based on SCr of 1.38 mg/dL  (H)). ------------------------------------------------------------------------------------------------------------------ Recent Labs    09/07/17 1925  TSH 2.932    Coagulation profile No results for input(s): INR, PROTIME in the last 168 hours. ------------------------------------------------------------------------------------------------------------------- No results for input(s): DDIMER in the last 72 hours. -------------------------------------------------------------------------------------------------------------------  Cardiac Enzymes No results for input(s): CKMB, TROPONINI, MYOGLOBIN in the last 168 hours.  Invalid input(s): CK ------------------------------------------------------------------------------------------------------------------    Component Value Date/Time   BNP 673.7 (H) 09/07/2017 1741     ---------------------------------------------------------------------------------------------------------------  Urinalysis    Component Value Date/Time   COLORURINE YELLOW 02/14/2017 2340   APPEARANCEUR HAZY (A) 02/14/2017 2340   LABSPEC 1.017 02/14/2017 2340   PHURINE 5.0 02/14/2017 2340   GLUCOSEU NEGATIVE 02/14/2017 2340   HGBUR SMALL (A) 02/14/2017 2340   BILIRUBINUR NEGATIVE 02/14/2017 2340   KETONESUR 5 (A) 02/14/2017 2340   PROTEINUR NEGATIVE 02/14/2017 2340   UROBILINOGEN 0.2 06/27/2009 1452   NITRITE NEGATIVE 02/14/2017 2340   LEUKOCYTESUR NEGATIVE 02/14/2017 2340    ----------------------------------------------------------------------------------------------------------------   Imaging Results:    Dg Chest 2 View  Result Date: 09/07/2017 CLINICAL DATA:  Shortness of breath. EXAM: CHEST  2 VIEW COMPARISON:  May 08, 2017 FINDINGS: Stable cardiomegaly. A loop recorder overlies the left side of the heart. The hila and mediastinum are normal. No pulmonary nodules or masses. No focal infiltrates identified. IMPRESSION: No active cardiopulmonary  disease. Electronically Signed   By: Dorise Bullion III M.D   On: 09/07/2017 18:35      Assessment & Plan:    Principal Problem:   Chest pain    Chest pain Tele Trop I q6h x3 Cont Aspirin Cont Plavix Cont metoprolol Cont Pravastatin Check lipid in am Start heparin iv pharmacy to dose as per cardiology recommendation to ED Check Cardiac echo NPO Cardiology consult , appreciate input  CHF (EF 50-55%), Severe MR Start Lasix 40mg  iv bid Cont metoprolol Consider CT surgery consult for input regarding MR  H/o  CVA Cont above medications  CKD stage3 Check cmp in am  Hypothyroidism Cont levothyroxine   DVT Prophylaxis Heparin iv,  SCDs  AM Labs Ordered, also please review Full Orders  Family Communication: Admission, patients condition and plan of care including tests being ordered have been discussed with the patient  who indicate understanding and agree with the plan and Code Status.  Code Status FULL CODE  Likely DC to  home  Condition GUARDED    Consults called: cardiology  Admission status: inpatient  Time spent in minutes : 45   Jani Gravel M.D on 09/07/2017 at 10:33 PM  Between 7am to 7pm - Pager - [phone]  . After 7pm go to [email or web address] - password [facility]  [facility] - Office  [phone]

## 2017-09-07 NOTE — ED Notes (Signed)
Informed first nurse of I-stat troponin 0.10

## 2017-09-07 NOTE — ED Notes (Signed)
Patient assisted to restroom.  Able to ambulate independently.

## 2017-09-08 ENCOUNTER — Other Ambulatory Visit (HOSPITAL_COMMUNITY): Payer: Medicare Other

## 2017-09-08 ENCOUNTER — Inpatient Hospital Stay (HOSPITAL_COMMUNITY): Payer: Medicare Other

## 2017-09-08 ENCOUNTER — Encounter (HOSPITAL_COMMUNITY): Payer: Self-pay | Admitting: General Practice

## 2017-09-08 ENCOUNTER — Other Ambulatory Visit: Payer: Self-pay

## 2017-09-08 DIAGNOSIS — E854 Organ-limited amyloidosis: Secondary | ICD-10-CM

## 2017-09-08 DIAGNOSIS — I5033 Acute on chronic diastolic (congestive) heart failure: Secondary | ICD-10-CM

## 2017-09-08 DIAGNOSIS — I509 Heart failure, unspecified: Secondary | ICD-10-CM

## 2017-09-08 DIAGNOSIS — I43 Cardiomyopathy in diseases classified elsewhere: Secondary | ICD-10-CM

## 2017-09-08 DIAGNOSIS — I5031 Acute diastolic (congestive) heart failure: Secondary | ICD-10-CM

## 2017-09-08 DIAGNOSIS — E8589 Other amyloidosis: Secondary | ICD-10-CM

## 2017-09-08 DIAGNOSIS — R072 Precordial pain: Secondary | ICD-10-CM

## 2017-09-08 DIAGNOSIS — R079 Chest pain, unspecified: Secondary | ICD-10-CM

## 2017-09-08 LAB — CBC
HCT: 40.7 % (ref 36.0–46.0)
Hemoglobin: 12.9 g/dL (ref 12.0–15.0)
MCH: 30.3 pg (ref 26.0–34.0)
MCHC: 31.7 g/dL (ref 30.0–36.0)
MCV: 95.5 fL (ref 78.0–100.0)
Platelets: 273 10*3/uL (ref 150–400)
RBC: 4.26 MIL/uL (ref 3.87–5.11)
RDW: 14.8 % (ref 11.5–15.5)
WBC: 2.9 10*3/uL — ABNORMAL LOW (ref 4.0–10.5)

## 2017-09-08 LAB — ECHOCARDIOGRAM COMPLETE
Height: 63 in
Weight: 2416 oz

## 2017-09-08 LAB — TROPONIN I
Troponin I: 0.09 ng/mL (ref <0.03)
Troponin I: 0.1 ng/mL (ref <0.03)
Troponin I: 0.09 ng/mL (ref <0.03)
Troponin I: 0.09 ng/mL (ref <0.03)

## 2017-09-08 LAB — COMPREHENSIVE METABOLIC PANEL
ALT: 25 U/L (ref 14–54)
AST: 28 U/L (ref 15–41)
Albumin: 3.4 g/dL — ABNORMAL LOW (ref 3.5–5.0)
Alkaline Phosphatase: 149 U/L — ABNORMAL HIGH (ref 38–126)
Anion gap: 11 (ref 5–15)
BUN: 17 mg/dL (ref 6–20)
CO2: 26 mmol/L (ref 22–32)
Calcium: 9 mg/dL (ref 8.9–10.3)
Chloride: 107 mmol/L (ref 101–111)
Creatinine, Ser: 1.36 mg/dL — ABNORMAL HIGH (ref 0.44–1.00)
GFR calc Af Amer: 44 mL/min — ABNORMAL LOW (ref >60)
GFR calc non Af Amer: 38 mL/min — ABNORMAL LOW (ref >60)
Glucose, Bld: 98 mg/dL (ref 65–99)
Potassium: 3.7 mmol/L (ref 3.5–5.1)
Sodium: 144 mmol/L (ref 135–145)
Total Bilirubin: 0.7 mg/dL (ref 0.3–1.2)
Total Protein: 6.5 g/dL (ref 6.5–8.1)

## 2017-09-08 LAB — LIPID PANEL
Cholesterol: 126 mg/dL (ref 0–200)
HDL: 52 mg/dL (ref >40)
LDL Cholesterol: 69 mg/dL (ref 0–99)
Total CHOL/HDL Ratio: 2.4 RATIO
Triglycerides: 24 mg/dL (ref <150)
VLDL: 5 mg/dL (ref 0–40)

## 2017-09-08 LAB — MRSA PCR SCREENING: MRSA by PCR: NEGATIVE

## 2017-09-08 LAB — HEPARIN LEVEL (UNFRACTIONATED)
Heparin Unfractionated: 0.33 IU/mL (ref 0.30–0.70)
Heparin Unfractionated: 0.28 IU/mL — ABNORMAL LOW (ref 0.30–0.70)

## 2017-09-08 MED ORDER — ENOXAPARIN SODIUM 40 MG/0.4ML ~~LOC~~ SOLN
40.0000 mg | SUBCUTANEOUS | Status: DC
  Administered 2017-09-08 – 2017-09-10 (×3): 40 mg via SUBCUTANEOUS
  Filled 2017-09-08 (×3): qty 0.4

## 2017-09-08 MED ORDER — GADOBENATE DIMEGLUMINE 529 MG/ML IV SOLN
20.0000 mL | Freq: Once | INTRAVENOUS | Status: AC | PRN
  Administered 2017-09-08: 20 mL via INTRAVENOUS

## 2017-09-08 NOTE — Consult Note (Signed)
Advanced Heart Failure Team Consult Note   Primary Physician: Seward Carol, MD Primary Cardiologist:  Loralie Champagne, MD  Reason for Consultation: Chest pain  HPI:    Katie [name] is seen today for evaluation of chest pain at the request of Dr. Ree Kida.  Katie [name] is a 72 y.o. female with a history of chronic diastolic HF, mitral regurgitation, HTN, HL, CVA, hypothyroidism, kidney cancer s/p right nephrectomy 2007, and CKD III.  She was admitted in June and Oct 1700 with A/C diastolic HF, which presented as chest pain. She required IV diuresis.    Last seen in HF clinic on 07/15/2017 with Dr [name]. She was doing well on lasix 40/20. 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 No changes were made.  She saw Dr. Acie Fredrickson 09/06/17. She had some LE edema and lasix was increased to 40 mg BID.  She took 40 mg lasix BID after seeing Dr. Acie Fredrickson and felt her swelling was better that day (2/5). She woke up yesterday with midsternal CP, SOB, and BLE edema. No nausea, vomiting, or diaphoresis. The CP was intermittent throughout the day, but then seemed to be more constant so she came to ED on 2/6 pm. She has felt that her neck veins were "jumping", but denies feeling a fast HR. She had an episode of dizziness walking to the bathroom earlier and one episode of dizziness while walking yesterday. She has a nonproductive cough. No fever or chills. She takes all of her meds as prescribed, but ran out of fioricet and levothyroxine on Monday. She has a decreased appetite, but states she follows a low salt diet and fluid restriction. Weights at home 163 Monday, then 151 this morning. Vague description of normal weights, but last clinic weight 154 lbs.   Pertinent admission labs include: Na 141, K 3.7, creatinine 1.38, BNP 673, troponin 0.10, hemoglobin 13.8, TSH 2.932 CXR: no active cardiopulmonary disease  Lives alone. No issues  with transportation or getting medications. Manages own medications. No alcohol, tobacco, or drugs.   So far, she has received 40 mg IV lasix x2 and is negative 1.7 liters. Heparin drip started by primary for concerns of ACS. No further SOB. One episode of CP that lasted a few minutes this morning and resolved spontaneously. She has been ambulating without SOB to the bathroom.  Echo 05/09/2017 - Left ventricle: The cavity size was normal. Wall thickness was   increased in a pattern of severe LVH. Systolic function was   normal. The estimated ejection fraction was in the range of 50%   to 55%. Wall motion was normal; there were no regional wall   motion abnormalities. Doppler parameters are consistent with   restrictive physiology, indicative of decreased left ventricular   diastolic compliance and/or increased left atrial pressure. - Ventricular septum: The contour showed diastolic flattening and   systolic flattening. - Mitral valve: There was severe regurgitation. - Left atrium: The atrium was severely dilated. - Right atrium: The atrium was severely dilated. - Tricuspid valve: There was moderate-severe regurgitation. - Pulmonary arteries: Systolic pressure was moderately increased.   PA peak pressure: 54 mm Hg (S). - Pericardium, extracardiac: A trivial pericardial effusion was   identified.  LHC 01/12/2016 Widely patent/normal coronary arteries.  RHC 01/12/2016 CO 4.53 CI 2.45 RA 4 PA 38/13 PA mean 23 LVEDP 12  Review of Systems: [y] = yes, [ ]  = no   General: Weight gain Blue.Reese ];  Weight loss [ ] ; Anorexia [ ] ; Fatigue Blue.Reese ]; Fever [ ] ; Chills [ ] ; Weakness [ ]   Cardiac: Chest pain/pressure Blue.Reese ]; Resting SOB Blue.Reese ]; Exertional SOB Blue.Reese ]; Orthopnea Blue.Reese ]; Pedal Edema [ ] ; Palpitations Blue.Reese ]; Syncope [ ] ; Presyncope [ ] ; Paroxysmal nocturnal dyspnea[ ]   Pulmonary: Cough [ y]; Wheezing[ ] ; Hemoptysis[ ] ; Sputum [ ] ; Snoring [ ]   GI: Vomiting[ ] ; Dysphagia[ ] ; Melena[ ] ; Hematochezia [ ] ;  Heartburn[ ] ; Abdominal pain [ ] ; Constipation [ ] ; Diarrhea [ ] ; BRBPR [ ]   GU: Hematuria[ ] ; Dysuria [ ] ; Nocturia[ ]   Vascular: Pain in legs with walking [ ] ; Pain in feet with lying flat [ ] ; Non-healing sores [ ] ; Stroke [ ] ; TIA [ ] ; Slurred speech [ ] ;  Neuro: Headaches[ y]; Vertigo[ ] ; Seizures[ ] ; Paresthesias[ ] ;Blurred vision [ ] ; Diplopia [ ] ; Vision changes [ ]   Ortho/Skin: Arthritis [ ] ; Joint pain [ ] ; Muscle pain [ ] ; Joint swelling [ ] ; Back Pain Blue.Reese ]; Rash [ ]   Psych: Depression[ ] ; Anxiety[ ]   Heme: Bleeding problems [ ] ; Clotting disorders [ ] ; Anemia [ ]   Endocrine: Diabetes [ ] ; Thyroid dysfunction[y ]  Home Medications Prior to Admission medications   Medication Sig Start Date End Date Taking? Authorizing Provider  acetaminophen (TYLENOL) 500 MG tablet Take 1,000 mg by mouth every 6 (six) hours as needed for headache (pain).   Yes [provider]  aspirin 81 MG tablet Take 1 tablet (81 mg total) daily by mouth. 06/14/17  Yes Larey Dresser, MD  butalbital-acetaminophen-caffeine (FIORICET, ESGIC) [phone] MG tablet Take 1 tablet by mouth every 12 (twelve) hours as needed for headache. 02/17/17  Yes Eulogio Bear U, DO  clopidogrel (PLAVIX) 75 MG tablet Take 1 tablet (75 mg total) by mouth daily. 02/13/17  Yes Lady Deutscher, MD  feeding supplement, ENSURE ENLIVE, (ENSURE ENLIVE) LIQD Take 237 mLs by mouth 2 (two) times daily between meals. 02/17/17  Yes Vann, Jessica U, DO  furosemide (LASIX) 40 MG tablet Take 1 tablet (40 mg total) by mouth 2 (two) times daily. 09/06/17 12/05/17 Yes Nahser, Wonda Cheng, MD  levothyroxine (SYNTHROID, LEVOTHROID) 25 MCG tablet Take 25 mcg by mouth daily before breakfast.   Yes [provider]  metoprolol tartrate (LOPRESSOR) 25 MG tablet Take 0.5 tablets (12.5 mg total) by mouth 2 (two) times daily. 05/26/17  Yes Nahser, Wonda Cheng, MD  potassium chloride (MICRO-K) 10 MEQ CR capsule 1 tab in AM , and 0.5 tab in PM Patient taking  differently: Take 5-10 mEq by mouth See admin instructions. 1 tab in AM , and 0.5 tab in PM 06/14/17  Yes Larey Dresser, MD  pravastatin (PRAVACHOL) 80 MG tablet Take 1 tablet (80 mg total) by mouth at bedtime. 09/06/17  Yes Nahser, Wonda Cheng, MD    Past Medical History: Past Medical History:  Diagnosis Date  . Chronic combined systolic and diastolic CHF (congestive heart failure) ([place])   . CKD (chronic kidney disease), stage III ([place])    removal of right kidney due to carcinoma  . Headache   . History of kidney cancer    a. s/p right nephrectomy ~2007.  Marland Kitchen History of loop recorder   . Hypercholesterolemia   . Hypertension   . Mitral regurgitation   . Pulmonary hypertension ([place])   . Stroke [facility])     Past Surgical History: Past Surgical History:  Procedure Laterality Date  . CARDIAC CATHETERIZATION N/A 01/12/2016  Procedure: Right/Left Heart Cath and Coronary/Graft Angiography;  Surgeon: Belva Crome, MD;  Location: [place] [facility];  Service: Cardiovascular;  Laterality: N/A;  . CERVICAL POLYPECTOMY N/A 04/23/2013   Procedure: CERVICAL POLYPECTOMY;  Surgeon: Maeola Sarah. Landry Mellow, MD;  Location: [facility];  Service: Gynecology;  Laterality: N/A;  Possible Polypectomy  . HYSTEROSCOPY W/D&C N/A 04/23/2013   Procedure: DILATATION AND CURETTAGE /HYSTEROSCOPY;  Surgeon: Maeola Sarah. Landry Mellow, MD;  Location: [facility];  Service: Gynecology;  Laterality: N/A;  . IR ANGIO INTRA EXTRACRAN SEL COM CAROTID INNOMINATE BILAT MOD SED  02/11/2017  . IR ANGIO VERTEBRAL SEL VERTEBRAL BILAT MOD SED  02/11/2017  . KNEE ARTHROSCOPY Left   . LOOP RECORDER INSERTION N/A 02/18/2017   Procedure: Loop Recorder Insertion;  Surgeon: Constance Haw, MD;  Location: [place] [facility];  Service: Cardiovascular;  Laterality: N/A;  . NEPHRECTOMY Right    due to carcinoma  . TEE WITHOUT CARDIOVERSION N/A 02/18/2017   Procedure: TRANSESOPHAGEAL ECHOCARDIOGRAM (TEE) WITH LOOP;  Surgeon: Acie Fredrickson Wonda Cheng, MD;  Location: [facility];   Service: Cardiovascular;  Laterality: N/A;    Family History: Family History  Problem Relation Age of Onset  . Stroke Brother   . Diabetes Mellitus II Sister   . Colon cancer Sister     Social History: Social History   Socioeconomic History  . Marital status: Single    Spouse name: None  . Number of children: None  . Years of education: None  . Highest education level: None  Social Needs  . Financial resource strain: None  . Food insecurity - worry: None  . Food insecurity - inability: None  . Transportation needs - medical: None  . Transportation needs - non-medical: None  Occupational History  . None  Tobacco Use  . Smoking status: Never Smoker  . Smokeless tobacco: Never Used  Substance and Sexual Activity  . Alcohol use: No  . Drug use: No  . Sexual activity: No  Other Topics Concern  . None  Social History Narrative  . None    Allergies:  Allergies  Allergen Reactions  . Tramadol Shortness Of Breath, Palpitations and Other (See Comments)    Objective:    Vital Signs:   Temp:  [97.8 F (36.6 C)] 97.8 F (36.6 C) (02/06 1734) Pulse Rate:  [63-91] 68 (02/07 0700) Resp:  [13-26] 13 (02/07 0600) BP: (96-150)/(66-97) 101/70 (02/07 0700) SpO2:  [99 %-100 %] 100 % (02/07 0700) Weight:  [151 lb (68.5 kg)] 151 lb (68.5 kg) (02/06 1734)    Weight change: Filed Weights   09/07/17 1734  Weight: 151 lb (68.5 kg)    Intake/Output:   Intake/Output Summary (Last 24 hours) at 09/08/2017 1013 Last data filed at 09/08/2017 5400 Gross per 24 hour  Intake -  Output 1660 ml  Net -1660 ml      Physical Exam    General:  Chronically ill appearing. No resp difficulty HEENT: normal Neck: supple. JVP to ear. Carotids 2+ bilat; no bruits. No lymphadenopathy or thyromegaly appreciated. Cor: PMI nondisplaced. Regular rate & rhythm. No rubs, gallops. 2/6 systolic murmur at apex. Chest tender to palpation Lungs: diminished, clear Abdomen: soft, nontender,  nondistended. No hepatosplenomegaly. No bruits or masses. Good bowel sounds. Extremities: no cyanosis, clubbing, rash, +1-2 pitting edema BLE Neuro: alert & orientedx3, cranial nerves grossly intact. moves all 4 extremities w/o difficulty. Affect pleasant   Telemetry   SR with PAC's. Personally reviewed.   EKG    NSR  with PAC's, 94 bpm. Personally reviewed.  Labs   Basic Metabolic Panel: Recent Labs  Lab 09/07/17 1741 09/08/17 0356  NA 141 144  K 3.7 3.7  CL 107 107  CO2 23 26  GLUCOSE 136* 98  BUN 21* 17  CREATININE 1.38* 1.36*  CALCIUM 9.6 9.0    Liver Function Tests: Recent Labs  Lab 09/08/17 0356  AST 28  ALT 25  ALKPHOS 149*  BILITOT 0.7  PROT 6.5  ALBUMIN 3.4*   No results for input(s): LIPASE, AMYLASE in the last 168 hours. No results for input(s): AMMONIA in the last 168 hours.  CBC: Recent Labs  Lab 09/07/17 1741 09/08/17 0356  WBC 4.0 2.9*  HGB 13.8 12.9  HCT 42.1 40.7  MCV 95.2 95.5  PLT 291 273    Cardiac Enzymes: Recent Labs  Lab 09/08/17 0026 09/08/17 0356  TROPONINI 0.09* 0.10*    BNP: BNP (last 3 results) Recent Labs    05/08/17 2000 06/14/17 1303 09/07/17 1741  BNP 740.6* 564.0* 673.7*    ProBNP (last 3 results) No results for input(s): PROBNP in the last 8760 hours.   CBG: No results for input(s): GLUCAP in the last 168 hours.  Coagulation Studies: No results for input(s): LABPROT, INR in the last 72 hours.   Imaging   Dg Chest 2 View  Result Date: 09/07/2017 CLINICAL DATA:  Shortness of breath. EXAM: CHEST  2 VIEW COMPARISON:  May 08, 2017 FINDINGS: Stable cardiomegaly. A loop recorder overlies the left side of the heart. The hila and mediastinum are normal. No pulmonary nodules or masses. No focal infiltrates identified. IMPRESSION: No active cardiopulmonary disease. Electronically Signed   By: Dorise Bullion III M.D   On: 09/07/2017 18:35      Medications:     Current Medications: . aspirin EC   81 mg Oral Daily  . clopidogrel  75 mg Oral Daily  . feeding supplement (ENSURE ENLIVE)  237 mL Oral BID BM  . furosemide  40 mg Intravenous BID  . levothyroxine  25 mcg Oral QAC breakfast  . metoprolol tartrate  12.5 mg Oral BID  . potassium chloride  10 mEq Oral BID  . pravastatin  80 mg Oral QHS  . sodium chloride flush  3 mL Intravenous Q12H     Infusions: . sodium chloride    . heparin 800 Units/hr (09/07/17 2208)       Patient Profile   Katie [name] is a 72 y.o. female with a history of chronic diastolic HF, mitral regurgitation, HTN, HL, CVA, hypothyroidism, kidney cancer s/p right nephrectomy 2007, and CKD III.  Assessment/Plan   1. Chest pain LHC 01/12/2016: widely patent/normal coronary arteries.  - Troponin 0.09 > 0.10. Will cycle. Likely demand ischmia - EKG unremarkable.   - Continue ASA, plavix, and statin. - D/C heparin drip.  2. Acute on chronic diastolic HF: Echo 76/1607: EF 55-60% echo with severe LVH and speckled myocardium consistent with cardiac amyloidosis.  SPEP was negative.  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 - Volume status elevated. - Continue lasix 40 mg IV BID - Continue metop tartrate 12.5 BID. - Repeat echo - Cardiomems is being worked up outpatient pending Biochemist, clinical. - Consider cMRI  3. Mitral regurgitation - 05/2017 echo read severe MR - Dr [name] consulted in October and thought regrugitation to be moderate and likely functional at that time - CT surgery has been consulted by primary - Repeat echo  4. CKD III, baseline creatinine 1.3-1.5 - Monitor daily BMET - Creatinine 1.36 this am - Follows with [place] [facility] outpatient  5. CVA 01/2017 - Has LINQ monitor - reviewed 1/25 with no arrhythmias  - Continue ASA, plavix, statin   Medication concerns reviewed with patient and pharmacy team. Barriers identified: none  Length of Stay: [place],  NP-C 09/08/17, 10:13 AM Pager [phone] (M-F 7a-4p)  Advanced Heart Failure Team Pager [phone] (M-F; 7a - 4p)  Please contact [facility] for night-coverage after hours (4p -7a ) and weekends on [email or web address]  Patient seen and examined with the above-signed Advanced Practice Provider and/or Housestaff. I personally reviewed laboratory data, imaging studies and relevant notes. I independently examined the patient and formulated the important aspects of the plan. I have edited the note to reflect any of my changes or salient points. I have personally discussed the plan with the patient and/or family.  72 y/o woman with diastolic HF in setting of CKD III and genetic variantf TTR amyloidosis. Presents with CP and dyspnea. Cardiac cath from 2017 reviewed with normal cors. Troponin flat so doubt ACS. Suspect main issue is volume overload. She is responding well to IV lasix. Will continue. Will follow electrolytes and renal function closely. Has prominent MR murmur on exam. Will repeat echo. We will follow closely. Currently in process of arranging outpatient treatment for TTR amyloidosis with patisiran pending insurance approval.   Glori Bickers, MD  5:43 PM

## 2017-09-08 NOTE — Progress Notes (Addendum)
PROGRESS NOTE    Katie [name]  ACZ:[id number] DOB: 03-08-1946 DOA: 09/07/2017 PCP: Seward Carol, MD   Chief Complaint  Patient presents with  . Shortness of Breath    Brief Narrative:  HPI on 09/07/2017 by Dr. Jani Gravel Katie [name]  is a 72 y.o. female, w CVA, hypertension, hyperlipidemia,  CKD stage 3, CHF (EF 50-55%), severe mitral regurgitation, consider amyloid, mod pulmonary hypertension, apparently presents with c/o dyspnea and chest discomfort this am  "tightness" with radiation to the neck.  Pt notes had extra dose of lasix after seeing Dr. Acie Fredrickson yesterday due to increase in edema.  Might be gaining some weight.  Pt uncertain if has orthopnea.  Pt has slight dry cough.  Denies fever, chills, n/v, abd pain, diarrhea, brbpr, dysuria.  Pt presented to ED, this am due to increase in dyspnea.   Interim history Admitted for CHF exacerbation. Cardiology consulted. Assessment & Plan   Acute diastolic CHF exacerbation -Presented with shortness of breath and chest tightness -Last echocardiogram 05/09/2017 showed an EF of 50-55%, Doppler parameters consistent with restrictive physiology, indicative of decreased left ventricular diastolic compliance and/or increased left atrial pressure. -BNP on admission 673.7 -Chest x-ray showed no acute cardiopulmonary disease -Continue Lasix 40 mg IV twice daily -Monitor intake and output, daily weights -Cardiology consulted and appreciated patient saw cardiologist on 07/06/2018, question of amyloid. Question whether patient needs cardiac MRI  Chest pain/pressure -Troponin mildly elevated at 0.09, 0.1.  Continue to cycle -EKG appears to be unremarkable  -Continue aspirin, Plavix, statin, metoprolol -Patient was started on heparin -As above, cardiology consulted and appreciated  History of CVA -Currently has no neurological deficits -Continue aspirin, statin, Plavix  Chronic kidney disease, stage III -Creatinine appears to be at baseline, continue  to monitor  Hypothyroidism -Continue Synthroid  Mitral regurgitation -Noted on echocardiogram in October 2018, severe -Dr. [name] consulted and appreciated in October, thought regurgitation to be moderate at that time -CT surgery consulted and appreciated -Pending repeat echocardiogram  DVT Prophylaxis heparin  Code Status: Full  Family Communication: None at bedside  Disposition Plan: Admitted, pending further cardiology and CT surgery recommendations  Consultants Cardiology CT surgery  Procedures  None  Antibiotics   Anti-infectives (From admission, onward)   None      Subjective:   Katie [name] seen and examined today.  Currently denies further chest pain.  Feels her shortness of breath is mildly improved.  Denies any current abdominal pain, nausea or vomiting, diarrhea constipation, dizziness or headache.   Objective:   Vitals:   09/08/17 0600 09/08/17 0630 09/08/17 0700 09/08/17 1017  BP: 101/71 96/73 101/70 112/66  Pulse: 70 67 68 (!) 113  Resp: 13   16  Temp:    (!) 97.3 F (36.3 C)  TempSrc:    Oral  SpO2: 100% 100% 100% 97%  Weight:      Height:        Intake/Output Summary (Last 24 hours) at 09/08/2017 1151 Last data filed at 09/08/2017 1018 Gross per 24 hour  Intake -  Output 1710 ml  Net -1710 ml   Filed Weights   09/07/17 1734  Weight: 68.5 kg (151 lb)    Exam  General: Well developed, well nourished, NAD, appears stated age  HEENT: NCAT, mucous membranes moist.   Cardiovascular: S1 S2 auscultated, tachycardic, 2/6 SEM  Respiratory: Diminished although clear  Abdomen: Soft, nontender, nondistended, + bowel sounds  Extremities: warm dry without cyanosis clubbing.  Nonpitting lower extremity edema, bilateral  Neuro: AAOx3, nonfocal  Psych: Normal affect and demeanor with intact judgement and insight   Data Reviewed: I have personally reviewed following labs and imaging studies  CBC: Recent Labs  Lab 09/07/17 1741 09/08/17 0356   WBC 4.0 2.9*  HGB 13.8 12.9  HCT 42.1 40.7  MCV 95.2 95.5  PLT 291 347   Basic Metabolic Panel: Recent Labs  Lab 09/07/17 1741 09/08/17 0356  NA 141 144  K 3.7 3.7  CL 107 107  CO2 23 26  GLUCOSE 136* 98  BUN 21* 17  CREATININE 1.38* 1.36*  CALCIUM 9.6 9.0   GFR: Estimated Creatinine Clearance: 34.7 mL/min (A) (by C-G formula based on SCr of 1.36 mg/dL (H)). Liver Function Tests: Recent Labs  Lab 09/08/17 0356  AST 28  ALT 25  ALKPHOS 149*  BILITOT 0.7  PROT 6.5  ALBUMIN 3.4*   No results for input(s): LIPASE, AMYLASE in the last 168 hours. No results for input(s): AMMONIA in the last 168 hours. Coagulation Profile: No results for input(s): INR, PROTIME in the last 168 hours. Cardiac Enzymes: Recent Labs  Lab 09/08/17 0026 09/08/17 0356  TROPONINI 0.09* 0.10*   BNP (last 3 results) No results for input(s): PROBNP in the last 8760 hours. HbA1C: No results for input(s): HGBA1C in the last 72 hours. CBG: No results for input(s): GLUCAP in the last 168 hours. Lipid Profile: Recent Labs    09/08/17 0356  CHOL 126  HDL 52  LDLCALC 69  TRIG 24  CHOLHDL 2.4   Thyroid Function Tests: Recent Labs    09/07/17 1925  TSH 2.932   Anemia Panel: No results for input(s): VITAMINB12, FOLATE, FERRITIN, TIBC, IRON, RETICCTPCT in the last 72 hours. Urine analysis:    Component Value Date/Time   COLORURINE YELLOW 02/14/2017 2340   APPEARANCEUR HAZY (A) 02/14/2017 2340   LABSPEC 1.017 02/14/2017 2340   PHURINE 5.0 02/14/2017 2340   GLUCOSEU NEGATIVE 02/14/2017 2340   HGBUR SMALL (A) 02/14/2017 2340   BILIRUBINUR NEGATIVE 02/14/2017 2340   KETONESUR 5 (A) 02/14/2017 2340   PROTEINUR NEGATIVE 02/14/2017 2340   UROBILINOGEN 0.2 06/27/2009 1452   NITRITE NEGATIVE 02/14/2017 2340   LEUKOCYTESUR NEGATIVE 02/14/2017 2340   Sepsis Labs: @LABRCNTIP (procalcitonin:4,lacticidven:4)  )No results found for this or any previous visit (from the past 240 hour(s)).     Radiology Studies: Dg Chest 2 View  Result Date: 09/07/2017 CLINICAL DATA:  Shortness of breath. EXAM: CHEST  2 VIEW COMPARISON:  May 08, 2017 FINDINGS: Stable cardiomegaly. A loop recorder overlies the left side of the heart. The hila and mediastinum are normal. No pulmonary nodules or masses. No focal infiltrates identified. IMPRESSION: No active cardiopulmonary disease. Electronically Signed   By: Dorise Bullion III M.D   On: 09/07/2017 18:35     Scheduled Meds: . aspirin EC  81 mg Oral Daily  . clopidogrel  75 mg Oral Daily  . feeding supplement (ENSURE ENLIVE)  237 mL Oral BID BM  . furosemide  40 mg Intravenous BID  . levothyroxine  25 mcg Oral QAC breakfast  . metoprolol tartrate  12.5 mg Oral BID  . potassium chloride  10 mEq Oral BID  . pravastatin  80 mg Oral QHS  . sodium chloride flush  3 mL Intravenous Q12H   Continuous Infusions: . sodium chloride    . heparin 800 Units/hr (09/07/17 2208)     LOS: 1 day   Time Spent in minutes   30 minutes  Baylyn Sickles D.O.  on 09/08/2017 at 11:51 AM  Between 7am to 7pm - Pager - [phone]  After 7pm go to [email or web address] - password [email or web address]  And look for the night coverage person covering for me after hours  [facility] Office  [phone]

## 2017-09-08 NOTE — Progress Notes (Signed)
  Echocardiogram 2D Echocardiogram has been performed.  Jannett Celestine 09/08/2017, 1:44 PM

## 2017-09-08 NOTE — Progress Notes (Signed)
[place] for heparin Indication: chest pain/ACS  Heparin Dosing Weight: 66.4 kg  Labs: Recent Labs    09/07/17 1741 09/08/17 0026 09/08/17 0356 09/08/17 1000  HGB 13.8  --  12.9  --   HCT 42.1  --  40.7  --   PLT 291  --  273  --   HEPARINUNFRC  --   --  0.33 0.28*  CREATININE 1.38*  --  1.36*  --   TROPONINI  --  0.09* 0.10*  --     Assessment: 54 yof presenting with CP, SOB. Pharmacy consulted to dose heparin for ACS. Not on anticoagulation PTA. CBC wnl, trop 0.1, no bleed documented.  Heparin level subtherapeutic at 0.28 and down from previous level of 0.33 at 800 units/hr.  Goal of Therapy:  Heparin level 0.3-0.7 units/ml Monitor platelets by anticoagulation protocol: Yes   Plan:  - Increase heparin drip to 950 units/hrs - Next heparin level in 8hrs - Daily heparin level/CBC - Monitor signs/symptoms of bleeding  Thank you for allowing [place] to participate in this patients care.  Mariella Saa, PharmD 09/08/2017 1:07 PM

## 2017-09-08 NOTE — Progress Notes (Signed)
ANTICOAGULATION CONSULT NOTE - Follow Up Consult  Pharmacy Consult for heparin Indication: chest pain/ACS  Labs: Recent Labs    09/07/17 1741 09/08/17 0026 09/08/17 0356  HGB 13.8  --  12.9  HCT 42.1  --  40.7  PLT 291  --  273  HEPARINUNFRC  --   --  0.33  CREATININE 1.38*  --   --   TROPONINI  --  0.09*  --     Assessment/Plan:  72yo female therapeutic on heparin with initial dosing for CP. Will continue gtt at current rate and confirm stable with additional level.    Wynona Neat, PharmD, BCPS  09/08/2017,4:43 AM

## 2017-09-08 NOTE — Progress Notes (Signed)
Received patient from ED via stretcher.  VSS, Alert and oriented x 4.  Denies pain at this time.  IV left AC infusing Heparin without difficulty.  Patient ambulated to the bed without difficulty.

## 2017-09-08 NOTE — Progress Notes (Signed)
TCTS BRIEF PROGRESS NOTE   Patient is known to me from previous outpatient consultation 04/27/2017. Will await for recommendations from the advanced heart failure team as based upon the diagnostic tests and treatment plan they have outlined.  Rexene Alberts, MD 09/08/2017 3:37 PM

## 2017-09-09 DIAGNOSIS — I5043 Acute on chronic combined systolic (congestive) and diastolic (congestive) heart failure: Secondary | ICD-10-CM

## 2017-09-09 DIAGNOSIS — I340 Nonrheumatic mitral (valve) insufficiency: Secondary | ICD-10-CM

## 2017-09-09 LAB — BASIC METABOLIC PANEL
Anion gap: 11 (ref 5–15)
BUN: 20 mg/dL (ref 6–20)
CO2: 25 mmol/L (ref 22–32)
Calcium: 9.1 mg/dL (ref 8.9–10.3)
Chloride: 104 mmol/L (ref 101–111)
Creatinine, Ser: 1.43 mg/dL — ABNORMAL HIGH (ref 0.44–1.00)
GFR calc Af Amer: 41 mL/min — ABNORMAL LOW (ref >60)
GFR calc non Af Amer: 36 mL/min — ABNORMAL LOW (ref >60)
Glucose, Bld: 85 mg/dL (ref 65–99)
Potassium: 4.2 mmol/L (ref 3.5–5.1)
Sodium: 140 mmol/L (ref 135–145)

## 2017-09-09 LAB — CBC
HCT: 40.2 % (ref 36.0–46.0)
Hemoglobin: 12.7 g/dL (ref 12.0–15.0)
MCH: 30.3 pg (ref 26.0–34.0)
MCHC: 31.6 g/dL (ref 30.0–36.0)
MCV: 95.9 fL (ref 78.0–100.0)
Platelets: 254 10*3/uL (ref 150–400)
RBC: 4.19 MIL/uL (ref 3.87–5.11)
RDW: 14.6 % (ref 11.5–15.5)
WBC: 3 10*3/uL — ABNORMAL LOW (ref 4.0–10.5)

## 2017-09-09 MED ORDER — POTASSIUM CHLORIDE CRYS ER 20 MEQ PO TBCR
20.0000 meq | EXTENDED_RELEASE_TABLET | Freq: Two times a day (BID) | ORAL | Status: DC
  Administered 2017-09-09 – 2017-09-11 (×5): 20 meq via ORAL
  Filled 2017-09-09 (×5): qty 1

## 2017-09-09 MED ORDER — FUROSEMIDE 10 MG/ML IJ SOLN
80.0000 mg | Freq: Two times a day (BID) | INTRAMUSCULAR | Status: DC
  Administered 2017-09-09 (×2): 80 mg via INTRAVENOUS
  Filled 2017-09-09 (×3): qty 8

## 2017-09-09 NOTE — Progress Notes (Signed)
Advanced Heart Failure Rounding Note  PCP-Cardiologist: Loralie Champagne, MD   Subjective:    -310 mls on 40 mg IV lasix bid yesterday. Lasix DCd overnight for low BPs. Weight up 1 lb. Creatinine at baseline 1.43.   Cardiac MRI done yesterday. Repeat echo with moderate to severe LVH, EF 40-45%, severe biatrial enlargement, severe MR.  No further CP or SOB. Feels swelling is better. No dizziness overnight.    Objective:   Weight Range: 152 lb 12.8 oz (69.3 kg) Body mass index is 27.07 kg/m.   Vital Signs:   Temp:  [97.3 F (36.3 C)-98 F (36.7 C)] 98 F (36.7 C) (02/08 0300) Pulse Rate:  [64-113] 80 (02/08 0300) Resp:  [15-20] 17 (02/07 1700) BP: (78-150)/(54-136) 78/54 (02/08 0300) SpO2:  [94 %-100 %] 95 % (02/07 1410) Weight:  [152 lb 12.8 oz (69.3 kg)] 152 lb 12.8 oz (69.3 kg) (02/08 0247) Last BM Date: 09/07/17  Weight change: Filed Weights   09/07/17 1734 09/09/17 0247  Weight: 151 lb (68.5 kg) 152 lb 12.8 oz (69.3 kg)    Intake/Output:   Intake/Output Summary (Last 24 hours) at 09/09/2017 0738 Last data filed at 09/08/2017 2349 Gross per 24 hour  Intake 240 ml  Output 550 ml  Net -310 ml      Physical Exam    General:  Chronically ill appearing. No resp difficulty HEENT: Normal Neck: Supple. JVP to ear. Carotids 2+ bilat; no bruits. No lymphadenopathy or thyromegaly appreciated. Cor: PMI nondisplaced. Regular rate & rhythm. No rubs, gallops. 3/6 systolic murmur at apex. Lungs: Clear Abdomen: Soft, nontender, nondistended. No hepatosplenomegaly. No bruits or masses. Good bowel sounds. Extremities: No cyanosis, clubbing, rash. Notpitting edema BLE Neuro: Alert & orientedx3, cranial nerves grossly intact. moves all 4 extremities w/o difficulty. Affect pleasant   Telemetry   NSR 70's with PAC's  EKG    No new tracings.  Labs    CBC Recent Labs    09/08/17 0356 09/09/17 0320  WBC 2.9* 3.0*  HGB 12.9 12.7  HCT 40.7 40.2  MCV 95.5 95.9  PLT  273 268   Basic Metabolic Panel Recent Labs    09/08/17 0356 09/09/17 0320  NA 144 140  K 3.7 4.2  CL 107 104  CO2 26 25  GLUCOSE 98 85  BUN 17 20  CREATININE 1.36* 1.43*  CALCIUM 9.0 9.1   Liver Function Tests Recent Labs    09/08/17 0356  AST 28  ALT 25  ALKPHOS 149*  BILITOT 0.7  PROT 6.5  ALBUMIN 3.4*   No results for input(s): LIPASE, AMYLASE in the last 72 hours. Cardiac Enzymes Recent Labs    09/08/17 0356 09/08/17 1115 09/08/17 1611  TROPONINI 0.10* 0.09* 0.09*    BNP: BNP (last 3 results) Recent Labs    05/08/17 2000 06/14/17 1303 09/07/17 1741  BNP 740.6* 564.0* 673.7*    ProBNP (last 3 results) No results for input(s): PROBNP in the last 8760 hours.   D-Dimer No results for input(s): DDIMER in the last 72 hours. Hemoglobin A1C No results for input(s): HGBA1C in the last 72 hours. Fasting Lipid Panel Recent Labs    09/08/17 0356  CHOL 126  HDL 52  LDLCALC 69  TRIG 24  CHOLHDL 2.4   Thyroid Function Tests Recent Labs    09/07/17 1925  TSH 2.932    Other results:   Imaging     No results found.   Medications:     Scheduled Medications: .  aspirin EC  81 mg Oral Daily  . clopidogrel  75 mg Oral Daily  . enoxaparin (LOVENOX) injection  40 mg Subcutaneous Q24H  . feeding supplement (ENSURE ENLIVE)  237 mL Oral BID BM  . levothyroxine  25 mcg Oral QAC breakfast  . potassium chloride  10 mEq Oral BID  . pravastatin  80 mg Oral QHS  . sodium chloride flush  3 mL Intravenous Q12H     Infusions: . sodium chloride       PRN Medications:  sodium chloride, acetaminophen **OR** acetaminophen, butalbital-acetaminophen-caffeine, sodium chloride flush    Patient Profile   Katie [name] is a 72 y.o. female with a history of chronic diastolic HF, mitral regurgitation, HTN, HL, CVA, hypothyroidism, kidney cancer s/p right nephrectomy 2007, and CKD III.  Admitted on 2/7 with CP and SOB in the setting of A/C diastolic  HF.  Assessment/Plan   1. Chest pain LHC 01/12/2016: widely patent/normal coronary arteries.  - Troponin 0.09 > 0.10 > 0.09. Will cycle. Likely demand ischemia from HF  - EKG unremarkable.   - Continue ASA, plavix, and statin. - No s/s ischemia  2. Acute on chronic diastolic HF: Echo 75/4492: EF 55-60% echo with severe LVH and speckled myocardium consistent with cardiac amyloidosis.SPEP was negative. 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  Echo 09/08/2017: EF 40-45% with severe MR and mod TR - Volume status mildly elevated - Lasic DC'd. Pt hypotensive overnight to 70's (pt asymptomatic). Will DC potassium supp - BB DC'd due to hypotension - Cardiomems is being worked up outpatient pending Biochemist, clinical. - Cardiac MRI done.  3. Mitral regurgitation - 05/2017 echo read severe MR - Dr [name] consulted in October and thought regrugitation to be moderate and likely functional at that time - CT surgery following. Awaiting HF treatment rec's  - Repeat echo showed severe MR  4. CKD III, baseline creatinine 1.3-1.5 - Monitor daily BMET - Creatinine 1.43 this am. - Follows with [place] [facility] outpatient  5. CVA 01/2017 - Has LINQ monitor - reviewed 1/25 with no arrhythmias  - Continue ASA, plavix, statin. No change.  Medication concerns reviewed with patient and pharmacy team. Barriers identified: none  Length of Stay: [place], NP  09/09/2017, 7:38 AM  Advanced Heart Failure Team Pager [phone] (M-F; 7a - 4p)  Please contact [facility] for night-coverage after hours (4p -7a ) and weekends on [email or web address]  1. Acute on chronic primarily diastolic CHF: Normal EF on 10/18 echo with severe LVH and speckled myocardium consistent with cardiac amyloidosis.  SPEP was negative.  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  She was admitted with volume overload.  Echo this admission with EF 40-45%, severe LVH, severe biatrial enlargement, severe MR.  Cardiac MRI preliminarily reviewed and also looks consistent with amyloidosis.  She remains volume overloaded on exam today. - With genetic variant TTR amyloidosis, we are trying to get approved for patisiran.  - Lasix 80 mg IV bid today.  BP stable today, stopped metoprolol for now with soft BP overnight.  2. Mitral regurgitation: MR in the moderate to severe range in the past.  Severe on echo this admission.  Mitral valve clip may be option in the future.  I think she would likely be too high risk for surgical MV repair.  3. CKD: Stage III.  Follow closely with diuresis. Creatinine  1.43 today.  4. CVA: 7/18. Has LINQ monitor, no atrial fibrillation detected so far.  - Continue ASA 81.   - Continue Plavix. - Continue statin  5. Neuropathy: Suspect peripheral neuropathy with tingling in feet, may be due to amyloidosis.  6. Elevated troponin: Suspect demand ischemia with volume overload.  Mild troponin elevation with no trend.  Normal coronaries in 2017.   Loralie Champagne 09/09/2017 8:19 AM

## 2017-09-09 NOTE — Progress Notes (Signed)
Patient c/o intermittent chest tightness and SOB at rest, she stated that  it was not new and that she always feel like that at home. Vital signs remained stable, EKG obtained shows sinus rhythm. Patient verbalized that she feels ok at the moment will continue to monitor.

## 2017-09-09 NOTE — Progress Notes (Signed)
PROGRESS NOTE    Katie [name]  ZJI:[id number] DOB: 02/21/1946 DOA: 09/07/2017 PCP: Seward Carol, MD   Chief Complaint  Patient presents with  . Shortness of Breath    Brief Narrative:  HPI on 09/07/2017 by Dr. Jani Gravel Katie [name]  is a 72 y.o. female, w CVA, hypertension, hyperlipidemia,  CKD stage 3, CHF (EF 50-55%), severe mitral regurgitation, consider amyloid, mod pulmonary hypertension, apparently presents with c/o dyspnea and chest discomfort this am  "tightness" with radiation to the neck.  Pt notes had extra dose of lasix after seeing Dr. Acie Fredrickson yesterday due to increase in edema.  Might be gaining some weight.  Pt uncertain if has orthopnea.  Pt has slight dry cough.  Denies fever, chills, n/v, abd pain, diarrhea, brbpr, dysuria.  Pt presented to ED, this am due to increase in dyspnea.   Interim history Admitted for CHF exacerbation. Cardiology consulted. Assessment & Plan   Acute diastolic CHF exacerbation -Presented with shortness of breath and chest tightness -Last echocardiogram 05/09/2017 showed an EF of 50-55%, Doppler parameters consistent with restrictive physiology, indicative of decreased left ventricular diastolic compliance and/or increased left atrial pressure. -BNP on admission 673.7 -Chest x-ray showed no acute cardiopulmonary disease -Was on Lasix 40 mg IV twice daily- held due to hypotension, however patient placed on lasix 80mg  IV BID by cardiology  -Monitor intake and output, daily weights -Cardiology consulted and appreciated patient saw cardiologist on 07/06/2018, question of amyloid- Cardiomems being worked on as an outpatient and Risk analyst.  -Cardiac MRI pending   Chest pain/pressure -Troponin mildly elevated at 0.09, 0.1.  Continue to cycle -EKG appears to be unremarkable  -Continue aspirin, Plavix, stati -Patient was started on heparin drip- however discontinued -hold metoprolol given hypotension -As above, cardiology consulted and  appreciated  Transient Hypotension -likely due to diuresis -as above, discontinued IV Lasix however this was restarted by cardiology. -Discontinue metoprolol -Continue to monitor closely  History of CVA -Currently has no neurological deficits -Continue aspirin, statin, Plavix  Chronic kidney disease, stage III -Creatinine appears to be at baseline, continue to monitor  Hypothyroidism -Continue Synthroid  Mitral regurgitation -Noted on echocardiogram in October 2018, severe -Dr. [name] consulted and appreciated in October, thought regurgitation to be moderate at that time -CT surgery consulted and appreciated -Echocardiogram: EF 40-45%, severe regurgitation mitral valve -possibly candidate for MV clip in the future. Likely high risk for surgical MV repair (per cardiology)  DVT Prophylaxis heparin  Code Status: Full  Family Communication: None at bedside  Disposition Plan: Admitted, pending further cardiology recommendations  Consultants Cardiology CT surgery  Procedures  Echocardiogram  Antibiotics   Anti-infectives (From admission, onward)   None      Subjective:   [name] seen and examined today.  Denies any further shortness of breath or chest pain or pressure.  Denies current abdominal pain, nausea vomiting, diarrhea constipation, dizziness or headache.  Objective:   Vitals:   09/08/17 2355 09/09/17 0247 09/09/17 0300 09/09/17 0750  BP:   (!) 78/54 109/71  Pulse: 78  80   Resp:    (!) 21  Temp:   98 F (36.7 C) 97.9 F (36.6 C)  TempSrc:   Oral Oral  SpO2:    100%  Weight:  69.3 kg (152 lb 12.8 oz)    Height:        Intake/Output Summary (Last 24 hours) at 09/09/2017 1215 Last data filed at 09/09/2017 1141 Gross per 24 hour  Intake 720 ml  Output 1500 ml  Net -780 ml   Filed Weights   09/07/17 1734 09/09/17 0247  Weight: 68.5 kg (151 lb) 69.3 kg (152 lb 12.8 oz)   Exam  General: Well developed, well nourished, NAD, appears stated  age  HEENT: NCAT, mucous membranes moist.   Cardiovascular: S1 S2 auscultated, 2/6SEM, tachycardic   Respiratory: Clear to auscultation bilaterally with equal chest rise  Abdomen: Soft, nontender, nondistended, + bowel sounds  Extremities: warm dry without cyanosis clubbing. Nonpitting LE edema (improved)  Neuro: AAOx3, nonfocal  Psych: Appropriate   Data Reviewed: I have personally reviewed following labs and imaging studies  CBC: Recent Labs  Lab 09/07/17 1741 09/08/17 0356 09/09/17 0320  WBC 4.0 2.9* 3.0*  HGB 13.8 12.9 12.7  HCT 42.1 40.7 40.2  MCV 95.2 95.5 95.9  PLT 291 273 030   Basic Metabolic Panel: Recent Labs  Lab 09/07/17 1741 09/08/17 0356 09/09/17 0320  NA 141 144 140  K 3.7 3.7 4.2  CL 107 107 104  CO2 23 26 25   GLUCOSE 136* 98 85  BUN 21* 17 20  CREATININE 1.38* 1.36* 1.43*  CALCIUM 9.6 9.0 9.1   GFR: Estimated Creatinine Clearance: 33.2 mL/min (A) (by C-G formula based on SCr of 1.43 mg/dL (H)). Liver Function Tests: Recent Labs  Lab 09/08/17 0356  AST 28  ALT 25  ALKPHOS 149*  BILITOT 0.7  PROT 6.5  ALBUMIN 3.4*   No results for input(s): LIPASE, AMYLASE in the last 168 hours. No results for input(s): AMMONIA in the last 168 hours. Coagulation Profile: No results for input(s): INR, PROTIME in the last 168 hours. Cardiac Enzymes: Recent Labs  Lab 09/08/17 0026 09/08/17 0356 09/08/17 1115 09/08/17 1611  TROPONINI 0.09* 0.10* 0.09* 0.09*   BNP (last 3 results) No results for input(s): PROBNP in the last 8760 hours. HbA1C: No results for input(s): HGBA1C in the last 72 hours. CBG: No results for input(s): GLUCAP in the last 168 hours. Lipid Profile: Recent Labs    09/08/17 0356  CHOL 126  HDL 52  LDLCALC 69  TRIG 24  CHOLHDL 2.4   Thyroid Function Tests: Recent Labs    09/07/17 1925  TSH 2.932   Anemia Panel: No results for input(s): VITAMINB12, FOLATE, FERRITIN, TIBC, IRON, RETICCTPCT in the last 72  hours. Urine analysis:    Component Value Date/Time   COLORURINE YELLOW 02/14/2017 2340   APPEARANCEUR HAZY (A) 02/14/2017 2340   LABSPEC 1.017 02/14/2017 2340   PHURINE 5.0 02/14/2017 2340   GLUCOSEU NEGATIVE 02/14/2017 2340   HGBUR SMALL (A) 02/14/2017 2340   BILIRUBINUR NEGATIVE 02/14/2017 2340   KETONESUR 5 (A) 02/14/2017 2340   PROTEINUR NEGATIVE 02/14/2017 2340   UROBILINOGEN 0.2 06/27/2009 1452   NITRITE NEGATIVE 02/14/2017 2340   LEUKOCYTESUR NEGATIVE 02/14/2017 2340   Sepsis Labs: @LABRCNTIP (procalcitonin:4,lacticidven:4)  ) Recent Results (from the past 240 hour(s))  MRSA PCR Screening     Status: None   Collection Time: 09/08/17  8:17 PM  Result Value Ref Range Status   MRSA by PCR NEGATIVE NEGATIVE Final    Comment:        The GeneXpert MRSA Assay (FDA approved for NASAL specimens only), is one component of a comprehensive MRSA colonization surveillance program. It is not intended to diagnose MRSA infection nor to guide or monitor treatment for MRSA infections. Performed at [facility], [place] [address] [place], [place] [address]       Radiology Studies: Dg Chest 2 View  Result Date: 09/07/2017 CLINICAL DATA:  Shortness of breath. EXAM: CHEST  2 VIEW COMPARISON:  May 08, 2017 FINDINGS: Stable cardiomegaly. A loop recorder overlies the left side of the heart. The hila and mediastinum are normal. No pulmonary nodules or masses. No focal infiltrates identified. IMPRESSION: No active cardiopulmonary disease. Electronically Signed   By: Dorise Bullion III M.D   On: 09/07/2017 18:35     Scheduled Meds: . aspirin EC  81 mg Oral Daily  . clopidogrel  75 mg Oral Daily  . enoxaparin (LOVENOX) injection  40 mg Subcutaneous Q24H  . feeding supplement (ENSURE ENLIVE)  237 mL Oral BID BM  . furosemide  80 mg Intravenous BID  . levothyroxine  25 mcg Oral QAC breakfast  . potassium chloride  20 mEq Oral BID  . pravastatin  80 mg Oral QHS  . sodium  chloride flush  3 mL Intravenous Q12H   Continuous Infusions: . sodium chloride       LOS: 2 days   Time Spent in minutes   45 minutes  Mahlia Fernando D.O. on 09/09/2017 at 12:15 PM  Between 7am to 7pm - Pager - [phone]  After 7pm go to [email or web address] - password [email or web address]  And look for the night coverage person covering for me after hours  [facility] Office  [phone]

## 2017-09-10 DIAGNOSIS — N183 Chronic kidney disease, stage 3 (moderate): Secondary | ICD-10-CM

## 2017-09-10 DIAGNOSIS — R748 Abnormal levels of other serum enzymes: Secondary | ICD-10-CM

## 2017-09-10 DIAGNOSIS — I5041 Acute combined systolic (congestive) and diastolic (congestive) heart failure: Secondary | ICD-10-CM

## 2017-09-10 LAB — CBC
HCT: 42.2 % (ref 36.0–46.0)
Hemoglobin: 13.9 g/dL (ref 12.0–15.0)
MCH: 31.3 pg (ref 26.0–34.0)
MCHC: 32.9 g/dL (ref 30.0–36.0)
MCV: 95 fL (ref 78.0–100.0)
Platelets: 243 10*3/uL (ref 150–400)
RBC: 4.44 MIL/uL (ref 3.87–5.11)
RDW: 14.1 % (ref 11.5–15.5)
WBC: 8.4 10*3/uL (ref 4.0–10.5)

## 2017-09-10 LAB — BASIC METABOLIC PANEL
Anion gap: 11 (ref 5–15)
BUN: 26 mg/dL — ABNORMAL HIGH (ref 6–20)
CO2: 29 mmol/L (ref 22–32)
Calcium: 9.4 mg/dL (ref 8.9–10.3)
Chloride: 100 mmol/L — ABNORMAL LOW (ref 101–111)
Creatinine, Ser: 1.44 mg/dL — ABNORMAL HIGH (ref 0.44–1.00)
GFR calc Af Amer: 41 mL/min — ABNORMAL LOW (ref >60)
GFR calc non Af Amer: 35 mL/min — ABNORMAL LOW (ref >60)
Glucose, Bld: 90 mg/dL (ref 65–99)
Potassium: 3.6 mmol/L (ref 3.5–5.1)
Sodium: 140 mmol/L (ref 135–145)

## 2017-09-10 MED ORDER — FUROSEMIDE 80 MG PO TABS
80.0000 mg | ORAL_TABLET | Freq: Two times a day (BID) | ORAL | Status: DC
  Administered 2017-09-10 – 2017-09-11 (×2): 80 mg via ORAL
  Filled 2017-09-10 (×2): qty 1

## 2017-09-10 NOTE — Progress Notes (Signed)
PROGRESS NOTE    Katie [name]  IRC:[id number] DOB: 11-14-1945 DOA: 09/07/2017 PCP: Seward Carol, MD   Chief Complaint  Patient presents with  . Shortness of Breath    Brief Narrative:  HPI on 09/07/2017 by Dr. Jani Gravel Katie [name]  is a 72 y.o. female, w CVA, hypertension, hyperlipidemia,  CKD stage 3, CHF (EF 50-55%), severe mitral regurgitation, consider amyloid, mod pulmonary hypertension, apparently presents with c/o dyspnea and chest discomfort this am  "tightness" with radiation to the neck.  Pt notes had extra dose of lasix after seeing Dr. Acie Fredrickson yesterday due to increase in edema.  Might be gaining some weight.  Pt uncertain if has orthopnea.  Pt has slight dry cough.  Denies fever, chills, n/v, abd pain, diarrhea, brbpr, dysuria.  Pt presented to ED, this am due to increase in dyspnea.   Interim history Admitted for CHF exacerbation. Cardiology consulted. Assessment & Plan   Acute diastolic CHF exacerbation -Presented with shortness of breath and chest tightness -Last echocardiogram 05/09/2017 showed an EF of 50-55%, Doppler parameters consistent with restrictive physiology, indicative of decreased left ventricular diastolic compliance and/or increased left atrial pressure. 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  -Cardiac MRI: Normal LV size and moderate LVH, EF 45% with diffuse hypokinesis.  Mildly dilated RV with mildly decreased systolic function.  Moderate to severe MR.  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  Chest pain/pressure -Troponin mildly elevated at 0.09, 0.1.  Continue to cycle -EKG appears to be unremarkable    -Continue aspirin, Plavix, statin -Patient was started on heparin drip- however discontinued -hold metoprolol given hypotension -As above, cardiology consulted and appreciated  Transient Hypotension -likely due to diuresis -as above, discontinued IV Lasix however this was restarted by cardiology. -Discontinue metoprolol -Continue to monitor closely  History of CVA -Currently has no neurological deficits -Continue aspirin, statin, Plavix  Chronic kidney disease, stage III -Creatinine appears to be at baseline, creatinine currently 1.44 -continue to monitor BMP  Hypothyroidism -Continue Synthroid  Mitral regurgitation -Noted on echocardiogram in October 2018, severe -Dr. [name] consulted and appreciated in October, thought regurgitation to be moderate at that time -CT surgery consulted and appreciated -Echocardiogram: EF 40-45%, severe regurgitation mitral valve -possibly candidate for MV clip in the future. Likely high risk for surgical MV repair (per cardiology)  DVT Prophylaxis heparin  Code Status: Full  Family Communication: None at bedside  Disposition Plan: Admitted, pending further cardiology recommendations  Consultants Cardiology CT surgery  Procedures  Echocardiogram  Cardiac MRI  Antibiotics   Anti-infectives (From admission, onward)   None      Subjective:   [name] seen and examined today.  Patient had episode of chest pain overnight however no longer complaining of chest pain.  Denies current shortness of breath, abdominal pain, nausea or vomiting, diarrhea or constipation.  Objective:   Vitals:   09/10/17 0348 09/10/17 0739 09/10/17 0958 09/10/17 1106  BP: 99/72 90/61 (!) 90/55   Pulse:      Resp: (!) 21 13 (!) 21 (!) 24  Temp:  97.8 F (36.6 C)  (!) 97.3 F (36.3 C)  TempSrc:  Oral  Oral  SpO2: 97% 100% 99% 99%  Weight:  66.4  kg (146 lb 6.4 oz)    Height:        Intake/Output Summary (Last 24 hours) at 09/10/2017 1115 Last data  filed at 09/10/2017 0347 Gross per 24 hour  Intake 720 ml  Output 1200 ml  Net -480 ml   Filed Weights   09/07/17 1734 09/09/17 0247 09/10/17 0739  Weight: 68.5 kg (151 lb) 69.3 kg (152 lb 12.8 oz) 66.4 kg (146 lb 6.4 oz)   Exam  General: Well developed, well nourished, NAD, appears stated age  28: NCAT,  mucous membranes moist.   Cardiovascular: S1 S2 auscultated, 3/6SEM, RRR  Respiratory: Clear to auscultation bilaterally with equal chest rise  Abdomen: Soft, nontender, nondistended, + bowel sounds  Extremities: warm dry without cyanosis clubbing or edema  Neuro: AAOx3, nonfocal  Psych: appropriate mood and affect, pleasant  Data Reviewed: I have personally reviewed following labs and imaging studies  CBC: Recent Labs  Lab 09/07/17 1741 09/08/17 0356 09/09/17 0320 09/10/17 0230  WBC 4.0 2.9* 3.0* 8.4  HGB 13.8 12.9 12.7 13.9  HCT 42.1 40.7 40.2 42.2  MCV 95.2 95.5 95.9 95.0  PLT 291 273 254 425   Basic Metabolic Panel: Recent Labs  Lab 09/07/17 1741 09/08/17 0356 09/09/17 0320 09/10/17 0230  NA 141 144 140 140  K 3.7 3.7 4.2 3.6  CL 107 107 104 100*  CO2 23 26 25 29   GLUCOSE 136* 98 85 90  BUN 21* 17 20 26*  CREATININE 1.38* 1.36* 1.43* 1.44*  CALCIUM 9.6 9.0 9.1 9.4   GFR: Estimated Creatinine Clearance: 32.3 mL/min (A) (by C-G formula based on SCr of 1.44 mg/dL (H)). Liver Function Tests: Recent Labs  Lab 09/08/17 0356  AST 28  ALT 25  ALKPHOS 149*  BILITOT 0.7  PROT 6.5  ALBUMIN 3.4*   No results for input(s): LIPASE, AMYLASE in the last 168 hours. No results for input(s): AMMONIA in the last 168 hours. Coagulation Profile: No results for input(s): INR, PROTIME in the last 168 hours. Cardiac Enzymes: Recent Labs  Lab 09/08/17 0026 09/08/17 0356 09/08/17 1115 09/08/17 1611  TROPONINI 0.09* 0.10* 0.09* 0.09*   BNP (last 3 results) No results for input(s): PROBNP in the last 8760 hours. HbA1C: No results for input(s): HGBA1C  in the last 72 hours. CBG: No results for input(s): GLUCAP in the last 168 hours. Lipid Profile: Recent Labs    09/08/17 0356  CHOL 126  HDL 52  LDLCALC 69  TRIG 24  CHOLHDL 2.4   Thyroid Function Tests: Recent Labs    09/07/17 1925  TSH 2.932   Anemia Panel: No results for input(s): VITAMINB12, FOLATE, FERRITIN, TIBC, IRON, RETICCTPCT in the last 72 hours. Urine analysis:    Component Value Date/Time   COLORURINE YELLOW 02/14/2017 2340   APPEARANCEUR HAZY (A) 02/14/2017 2340   LABSPEC 1.017 02/14/2017 2340   PHURINE 5.0 02/14/2017 2340   GLUCOSEU NEGATIVE 02/14/2017 2340   HGBUR SMALL (A) 02/14/2017 2340   BILIRUBINUR NEGATIVE 02/14/2017 2340   KETONESUR 5 (A) 02/14/2017 2340   PROTEINUR NEGATIVE 02/14/2017 2340   UROBILINOGEN 0.2 06/27/2009 1452   NITRITE NEGATIVE 02/14/2017 2340   LEUKOCYTESUR NEGATIVE 02/14/2017 2340   Sepsis Labs: @LABRCNTIP (procalcitonin:4,lacticidven:4)  ) Recent Results (from the past 240 hour(s))  MRSA PCR Screening     Status: None   Collection Time: 09/08/17  8:17 PM  Result Value Ref Range Status   MRSA by PCR NEGATIVE NEGATIVE Final    Comment:  The GeneXpert MRSA Assay (FDA approved for NASAL specimens only), is one component of a comprehensive MRSA colonization surveillance program. It is not intended to diagnose MRSA infection nor to guide or monitor treatment for MRSA infections. Performed at [facility], [place] [address], [place], [place] [address]       Radiology Studies: Mr Cardiac Morphology W Wo Contrast  Result Date: 09/09/2017 CLINICAL DATA:  Cardiac amyloidosis EXAM: CARDIAC MRI TECHNIQUE: The patient was scanned on a 1.5 Tesla GE magnet. A dedicated cardiac coil was used. Functional imaging was done using Fiesta sequences. 2,3, and 4 chamber views were done to assess for RWMA's. Modified Simpson's rule using a short axis stack was used to calculate an ejection fraction on a dedicated work Ecologist. The patient received 20 cc of Multihance. After 10 minutes inversion recovery sequences were used to assess for infiltration and scar tissue. CONTRAST:  20 cc Multihance contrast FINDINGS: Limited images of the lung fields showed no gross abnormalities. There was a small circumferential pericardial effusion. Normal left ventricular size with moderate concentric LV hypertrophy. EF 45% with diffuse hypokinesis. Mildly dilated right ventricle with mildly decreased systolic function. Severe left atrial enlargement. Severe right atrial enlargement. Moderate tricuspid regurgitation. The aortic valve was trileaflet with no significant regurgitation or stenosis. Visually, mitral regurgitation appeared severe. However, regurgitant fraction 41% suggests moderate mitral regurgitation. On delayed enhancement imaging, there was extensive late gadolinium enhancement (LGE) involving the free walls of both atria and the interatrial septum, the right ventricle, and extensive subendocardial LGE throughout the left ventricle. Measurements: LVEDV 154 mL LVSV 69 mL LVEF 45% Aortic forward volume 41 mL MR regurgitant fraction 41% IMPRESSION: 1. Normal LV size with moderate LVH, EF 45% with diffuse hypokinesis. 2.  Mildly dilated RV with mildly decreased systolic function. 3. Moderate to severe mitral regurgitation (moderate by calculated regurgitant fraction, severe visually). 4.  Severe biatrial enlargement. 5.  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 Dalton Mclean Electronically Signed   By: Loralie Champagne M.D.   On: 09/09/2017 17:38     Scheduled Meds: . aspirin EC  81 mg Oral Daily  . clopidogrel  75 mg Oral Daily  . enoxaparin (LOVENOX) injection  40 mg Subcutaneous Q24H  . feeding supplement (ENSURE ENLIVE)  237 mL Oral BID BM  . furosemide  80 mg Intravenous BID  . levothyroxine  25 mcg Oral QAC breakfast  . potassium  chloride  20 mEq Oral BID  . pravastatin  80 mg Oral QHS  . sodium chloride flush  3 mL Intravenous Q12H   Continuous Infusions: . sodium chloride       LOS: 3 days   Time Spent in minutes   30 minutes  Ason Heslin D.O. on 09/10/2017 at 11:15 AM  Between 7am to 7pm - Pager - [phone]  After 7pm go to [email or web address] - password [email or web address]  And look for the night coverage person covering for me after hours  [facility] Office  [phone]

## 2017-09-10 NOTE — Progress Notes (Signed)
Progress Note  Patient Name: Katie [name] Date of Encounter: 09/10/2017  Primary Cardiologist: Loralie Champagne, MD   Subjective   Net diuresis 2.2L. Weight down 5-6 lb. Feels better. Creatinine unchanged.  Inpatient Medications    Scheduled Meds: . aspirin EC  81 mg Oral Daily  . clopidogrel  75 mg Oral Daily  . enoxaparin (LOVENOX) injection  40 mg Subcutaneous Q24H  . feeding supplement (ENSURE ENLIVE)  237 mL Oral BID BM  . furosemide  80 mg Intravenous BID  . levothyroxine  25 mcg Oral QAC breakfast  . potassium chloride  20 mEq Oral BID  . pravastatin  80 mg Oral QHS  . sodium chloride flush  3 mL Intravenous Q12H   Continuous Infusions: . sodium chloride     PRN Meds: sodium chloride, acetaminophen **OR** acetaminophen, butalbital-acetaminophen-caffeine, sodium chloride flush   Vital Signs    Vitals:   09/10/17 0348 09/10/17 0739 09/10/17 0958 09/10/17 1106  BP: 99/72 90/61 (!) 90/55 (!) 96/57  Pulse:      Resp: (!) 21 13 (!) 21 (!) 24  Temp:  97.8 F (36.6 C)  (!) 97.3 F (36.3 C)  TempSrc:  Oral  Oral  SpO2: 97% 100% 99% 99%  Weight:  146 lb 6.4 oz (66.4 kg)    Height:        Intake/Output Summary (Last 24 hours) at 09/10/2017 1225 Last data filed at 09/10/2017 7628 Gross per 24 hour  Intake 480 ml  Output 200 ml  Net 280 ml   Filed Weights   09/07/17 1734 09/09/17 0247 09/10/17 0739  Weight: 151 lb (68.5 kg) 152 lb 12.8 oz (69.3 kg) 146 lb 6.4 oz (66.4 kg)    Telemetry    NSR - Personally Reviewed  ECG    NSR, low voltage limb leads - Personally Reviewed  Physical Exam  Looks comfortable GEN: No acute distress.   Neck: No JVD Cardiac: RRR, 3/6 holosystolic apical murmur, no diastolic murmurs, rubs, or gallops.  Respiratory: Clear to auscultation bilaterally. GI: Soft, nontender, non-distended  MS: No edema; No deformity. Neuro:  Nonfocal  Psych: Normal affect   Labs    Chemistry Recent Labs  Lab 09/08/17 0356 09/09/17 0320  09/10/17 0230  NA 144 140 140  K 3.7 4.2 3.6  CL 107 104 100*  CO2 26 25 29   GLUCOSE 98 85 90  BUN 17 20 26*  CREATININE 1.36* 1.43* 1.44*  CALCIUM 9.0 9.1 9.4  PROT 6.5  --   --   ALBUMIN 3.4*  --   --   AST 28  --   --   ALT 25  --   --   ALKPHOS 149*  --   --   BILITOT 0.7  --   --   GFRNONAA 38* 36* 35*  GFRAA 44* 41* 41*  ANIONGAP 11 11 11      Hematology Recent Labs  Lab 09/08/17 0356 09/09/17 0320 09/10/17 0230  WBC 2.9* 3.0* 8.4  RBC 4.26 4.19 4.44  HGB 12.9 12.7 13.9  HCT 40.7 40.2 42.2  MCV 95.5 95.9 95.0  MCH 30.3 30.3 31.3  MCHC 31.7 31.6 32.9  RDW 14.8 14.6 14.1  PLT 273 254 243    Cardiac Enzymes Recent Labs  Lab 09/08/17 0026 09/08/17 0356 09/08/17 1115 09/08/17 1611  TROPONINI 0.09* 0.10* 0.09* 0.09*    Recent Labs  Lab 09/07/17 1814  TROPIPOC 0.10*     BNP Recent Labs  Lab 09/07/17 1741  BNP 673.7*     DDimer No results for input(s): DDIMER in the last 168 hours.   Radiology    Mr Cardiac Morphology W Wo Contrast  Result Date: 09/09/2017 CLINICAL DATA:  Cardiac amyloidosis EXAM: CARDIAC MRI TECHNIQUE: The patient was scanned on a 1.5 Tesla GE magnet. A dedicated cardiac coil was used. Functional imaging was done using Fiesta sequences. 2,3, and 4 chamber views were done to assess for RWMA's. Modified Simpson's rule using a short axis stack was used to calculate an ejection fraction on a dedicated work Conservation officer, nature. The patient received 20 cc of Multihance. After 10 minutes inversion recovery sequences were used to assess for infiltration and scar tissue. CONTRAST:  20 cc Multihance contrast FINDINGS: Limited images of the lung fields showed no gross abnormalities. There was a small circumferential pericardial effusion. Normal left ventricular size with moderate concentric LV hypertrophy. EF 45% with diffuse hypokinesis. Mildly dilated right ventricle with mildly decreased systolic function. Severe left atrial  enlargement. Severe right atrial enlargement. Moderate tricuspid regurgitation. The aortic valve was trileaflet with no significant regurgitation or stenosis. Visually, mitral regurgitation appeared severe. However, regurgitant fraction 41% suggests moderate mitral regurgitation. On delayed enhancement imaging, there was extensive late gadolinium enhancement (LGE) involving the free walls of both atria and the interatrial septum, the right ventricle, and extensive subendocardial LGE throughout the left ventricle. Measurements: LVEDV 154 mL LVSV 69 mL LVEF 45% Aortic forward volume 41 mL MR regurgitant fraction 41% IMPRESSION: 1. Normal LV size with moderate LVH, EF 45% with diffuse hypokinesis. 2.  Mildly dilated RV with mildly decreased systolic function. 3. Moderate to severe mitral regurgitation (moderate by calculated regurgitant fraction, severe visually). 4.  Severe biatrial enlargement. 5.  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 Dalton Mclean Electronically Signed   By: Loralie Champagne M.D.   On: 09/09/2017 17:38    Cardiac Studies   ECHO 09/08/2017  - Left ventricle: The cavity size was normal. Wall thickness was   increased in a pattern of moderate to severe LVH. Systolic   function was mildly to moderately reduced. The estimated ejection   fraction was in the range of 40% to 45%. Wall motion was normal;   there were no regional wall motion abnormalities. - Mitral valve: There was severe regurgitation directed   posteriorly. - Left atrium: The atrium was severely dilated. - Right atrium: The atrium was severely dilated. - Tricuspid valve: There was moderate regurgitation. - Pulmonary arteries: Systolic pressure was moderately increased.   PA peak pressure: 49 mm Hg (S).  09/08/2017 cMRI  IMPRESSION: 1. Normal LV size with moderate LVH, EF 45% with diffuse hypokinesis.  2.  Mildly dilated RV with mildly  decreased systolic function.  3. Moderate to severe mitral regurgitation (moderate by calculated regurgitant fraction, severe visually).  4.  Severe biatrial enlargement.  5.  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   Patient Profile     72 y.o. female with a history ofchronic diastolic HF, mitral regurgitation, HTN, HL, CVA,hypothyroidism, kidney cancer s/p right nephrectomy 2007, andCKD III.  Admitted on 2/7 with CP and SOB in the setting of A/C diastolic HF.  Assessment & Plan    1. CHF: appears well compensated, switch to PO diuretics today. 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 2. MR: high risk for surgical repair - consider for Mitraclip 3. CKD 3: stable 4. Abnormal troponin: normal coronaries; due to demand  ischemia. 5. Cryptogenic stroke: on ASA and clopidogrel. No atrial fibrillation has been detected by ILR.  For questions or updates, please contact [place] Please consult [email or web address] for contact info under Cardiology/STEMI.      Signed, Sanda Klein, MD  09/10/2017, 12:25 PM

## 2017-09-11 LAB — CBC
HCT: 42 % (ref 36.0–46.0)
Hemoglobin: 13.4 g/dL (ref 12.0–15.0)
MCH: 30.5 pg (ref 26.0–34.0)
MCHC: 31.9 g/dL (ref 30.0–36.0)
MCV: 95.5 fL (ref 78.0–100.0)
Platelets: 273 10*3/uL (ref 150–400)
RBC: 4.4 MIL/uL (ref 3.87–5.11)
RDW: 14.2 % (ref 11.5–15.5)
WBC: 4.1 10*3/uL (ref 4.0–10.5)

## 2017-09-11 LAB — BASIC METABOLIC PANEL
Anion gap: 12 (ref 5–15)
BUN: 22 mg/dL — ABNORMAL HIGH (ref 6–20)
CO2: 27 mmol/L (ref 22–32)
Calcium: 9.4 mg/dL (ref 8.9–10.3)
Chloride: 102 mmol/L (ref 101–111)
Creatinine, Ser: 1.4 mg/dL — ABNORMAL HIGH (ref 0.44–1.00)
GFR calc Af Amer: 42 mL/min — ABNORMAL LOW (ref >60)
GFR calc non Af Amer: 37 mL/min — ABNORMAL LOW (ref >60)
Glucose, Bld: 88 mg/dL (ref 65–99)
Potassium: 3.8 mmol/L (ref 3.5–5.1)
Sodium: 141 mmol/L (ref 135–145)

## 2017-09-11 MED ORDER — FUROSEMIDE 80 MG PO TABS: 80.0000 mg | ORAL_TABLET | Freq: Two times a day (BID) | ORAL | 0 refills | Status: DC

## 2017-09-11 MED ORDER — POTASSIUM CHLORIDE CRYS ER 20 MEQ PO TBCR: 20.0000 meq | EXTENDED_RELEASE_TABLET | Freq: Two times a day (BID) | ORAL | 0 refills | Status: DC

## 2017-09-11 NOTE — Progress Notes (Signed)
Progress Note  Patient Name: Katie [name] Date of Encounter: 09/11/2017  Primary Cardiologist: Loralie Champagne, MD   Subjective   Total net diuresis 2.6 L.  Weight essentially unchanged from yesterday after switching to oral diuretics.  Creatinine stable at 1.44.  Able to lie flat in bed.  Occasionally feels the heart pounding sensation in her neck, but this is much improved compared to admission.  Inpatient Medications    Scheduled Meds: . aspirin EC  81 mg Oral Daily  . clopidogrel  75 mg Oral Daily  . enoxaparin (LOVENOX) injection  40 mg Subcutaneous Q24H  . feeding supplement (ENSURE ENLIVE)  237 mL Oral BID BM  . furosemide  80 mg Oral BID  . levothyroxine  25 mcg Oral QAC breakfast  . potassium chloride  20 mEq Oral BID  . pravastatin  80 mg Oral QHS  . sodium chloride flush  3 mL Intravenous Q12H   Continuous Infusions: . sodium chloride     PRN Meds: sodium chloride, acetaminophen **OR** acetaminophen, butalbital-acetaminophen-caffeine, sodium chloride flush   Vital Signs    Vitals:   09/11/17 0445 09/11/17 0451 09/11/17 0742 09/11/17 0940  BP: 105/70  96/61 (!) 88/59  Pulse: 78     Resp: 18  16 16   Temp: 98 F (36.7 C)  (!) 97.3 F (36.3 C)   TempSrc: Oral  Oral   SpO2: 98%  97%   Weight:  146 lb 4.8 oz (66.4 kg)    Height:        Intake/Output Summary (Last 24 hours) at 09/11/2017 1049 Last data filed at 09/11/2017 0900 Gross per 24 hour  Intake 957 ml  Output 1400 ml  Net -443 ml   Filed Weights   09/09/17 0247 09/10/17 0739 09/11/17 0451  Weight: 152 lb 12.8 oz (69.3 kg) 146 lb 6.4 oz (66.4 kg) 146 lb 4.8 oz (66.4 kg)    Telemetry    NSR - Personally Reviewed  ECG    No new tracing- Personally Reviewed  Physical Exam  Looks very comfortable GEN: No acute distress.   Neck: No JVD Cardiac: RRR, very faint apical holosystolic murmur, no systolic murmurs, rubs, or gallops.  Respiratory: Clear to auscultation bilaterally. GI: Soft,  nontender, non-distended  MS: No edema; No deformity. Neuro:  Nonfocal  Psych: Normal affect   Labs    Chemistry Recent Labs  Lab 09/08/17 0356 09/09/17 0320 09/10/17 0230 09/11/17 0501  NA 144 140 140 141  K 3.7 4.2 3.6 3.8  CL 107 104 100* 102  CO2 26 25 29 27   GLUCOSE 98 85 90 88  BUN 17 20 26* 22*  CREATININE 1.36* 1.43* 1.44* 1.40*  CALCIUM 9.0 9.1 9.4 9.4  PROT 6.5  --   --   --   ALBUMIN 3.4*  --   --   --   AST 28  --   --   --   ALT 25  --   --   --   ALKPHOS 149*  --   --   --   BILITOT 0.7  --   --   --   GFRNONAA 38* 36* 35* 37*  GFRAA 44* 41* 41* 42*  ANIONGAP 11 11 11 12      Hematology Recent Labs  Lab 09/09/17 0320 09/10/17 0230 09/11/17 0501  WBC 3.0* 8.4 4.1  RBC 4.19 4.44 4.40  HGB 12.7 13.9 13.4  HCT 40.2 42.2 42.0  MCV 95.9 95.0 95.5  MCH 30.3  31.3 30.5  MCHC 31.6 32.9 31.9  RDW 14.6 14.1 14.2  PLT 254 243 273    Cardiac Enzymes Recent Labs  Lab 09/08/17 0026 09/08/17 0356 09/08/17 1115 09/08/17 1611  TROPONINI 0.09* 0.10* 0.09* 0.09*    Recent Labs  Lab 09/07/17 1814  TROPIPOC 0.10*     BNP Recent Labs  Lab 09/07/17 1741  BNP 673.7*     DDimer No results for input(s): DDIMER in the last 168 hours.   Radiology    No results found.  Cardiac Studies  ECHO 09/08/2017   - Left ventricle: The cavity size was normal. Wall thickness was increased in a pattern of moderate to severe LVH. Systolic function was mildly to moderately reduced. The estimated ejection fraction was in the range of 40% to 45%. Wall motion was normal; there were no regional wall motion abnormalities. - Mitral valve: There was severe regurgitation directed posteriorly. - Left atrium: The atrium was severely dilated. - Right atrium: The atrium was severely dilated. - Tricuspid valve: There was moderate regurgitation. - Pulmonary arteries: Systolic pressure was moderately increased. PA peak pressure: 49 mm Hg (S).  09/08/2017  cMRI  IMPRESSION: 1. Normal LV size with moderate LVH, EF 45% with diffuse hypokinesis.  2. Mildly dilated RV with mildly decreased systolic function.  3. Moderate to severe mitral regurgitation (moderate by calculated regurgitant fraction, severe visually).  4. Severe biatrial enlargement.  5. 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    Patient Profile     72 y.o. female with a history ofchronic diastolic HF, mitral regurgitation, HTN, HL, CVA,hypothyroidism, kidney cancer s/p right nephrectomy 2007, andCKD III.  Admitted on 2/7 with CP and SOB in the setting of A/C diastolic HF. Assessment & Plan    1. CHF: appears well compensated switching to PO diuretics yesterday. 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 2. MR: high risk for surgical repair - consider for Mitraclip.  Interestingly, on physical exam the intensity of her murmur is substantially diminished.  Possibly functional MR, volume/loading conditions-related.  It would be worthwhile reassessing the severity of MR with echo when she is fully diuresed 3. CKD 3: stable.  Creatinine 1.40, potassium 3.8 4. Abnormal troponin: normal coronaries; due to demand ischemia. 5. Cryptogenic stroke: on ASA and clopidogrel. No atrial fibrillation has been detected by ILR.   I think she is ready for discharge.  Her blood pressure is relatively low but she is completely asymptomatic.  She has a follow-up appointment already scheduled in the heart failure clinic on February 19 with Dr. [name].  We will keep on the 80 mg twice daily dose of furosemide until then.  Asked her to weigh daily once she gets home and call us if she loses or gains 3 pounds or more from today's weight.  For questions or updates, please contact [place] Please consult [email or web address] for contact info under Cardiology/STEMI.      Signed, Sanda Klein, MD  09/11/2017, 10:49 AM

## 2017-09-11 NOTE — Discharge Summary (Signed)
Physician Discharge Summary  Katie [name] EQA:[id number] DOB: [birth year] DOA: 09/07/2017  PCP: Seward Carol, MD  Admit date: 09/07/2017 Discharge date: 09/11/2017  Time spent: 45 minutes  Recommendations for Outpatient Follow-up:  Patient will be discharged to home.  Patient will need to follow up with primary care provider within one week of discharge, repeat BMP.  Follow up with Dr. [name], cardiology, on 09/20/2017. Patient should continue medications as prescribed.  Patient should follow a heart healthy diet.   Discharge Diagnoses:  Acute diastolic CHF exacerbation Chest pain/pressure Transient Hypotension History of CVA Chronic kidney disease, stage III Hypothyroidism Mitral regurgitation  Discharge Condition: Stable   Diet recommendation: heart healthy  Filed Weights   09/09/17 0247 09/10/17 0739 09/11/17 0451  Weight: 69.3 kg (152 lb 12.8 oz) 66.4 kg (146 lb 6.4 oz) 66.4 kg (146 lb 4.8 oz)    History of present illness:  on 09/07/2017 by Dr. Jani Gravel Katie [name] a71 y.o.female,w CVA, hypertension, hyperlipidemia, CKD stage 3, CHF (EF 50-55%), severe mitral regurgitation, consider amyloid, mod pulmonary hypertension, apparently presents with c/o dyspnea and chest discomfort this am "tightness" with radiation to the neck. Pt notes had extra dose of lasix after seeing Dr. Acie Fredrickson yesterday due to increase in edema. Might be gaining some weight. Pt uncertain if has orthopnea. Pt has slight dry cough. Denies fever, chills, n/v, abd pain, diarrhea, brbpr, dysuria. Pt presented to ED, this am due to increase in dyspnea.   Hospital Course:  Acute diastolic CHF exacerbation -Presented with shortness of breath and chest tightness -Last echocardiogram 05/09/2017 showed an EF of 50-55%, Doppler parameters consistent with restrictive physiology, indicative of decreased left ventricular diastolic compliance and/or increased left atrial pressure. 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  -Cardiac MRI: Normal LV size and moderate LVH, EF 45% with diffuse hypokinesis.  Mildly dilated RV with mildly decreased systolic function.  Moderate to severe MR.  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 -was on IV lasix and transitioned to oral lasix 80mg  BID  Chest pain/pressure -Troponin mildly elevated at 0.09, 0.1.  Continue to cycle -EKG appears to be unremarkable  -Continue aspirin, Plavix, statin -Patient was started on heparin drip- however discontinued -hold metoprolol given hypotension -As above, cardiology consulted and appreciated  Transient Hypotension -likely due to diuresis -as above, discontinued IV Lasix however this was restarted by cardiology. -Discontinue metoprolol -Continue to monitor closely  History of CVA -Currently has no neurological deficits -Continue aspirin, statin, Plavix  Chronic kidney disease, stage III -Creatinine appears to be at baseline, creatinine currently 1.40 -continue to monitor BMP  Hypothyroidism -Continue Synthroid  Mitral regurgitation -Noted on echocardiogram in October 2018, severe -Dr. [name] consulted and appreciated in October, thought regurgitation to be moderate at that time -CT surgery consulted and appreciated -Echocardiogram: EF 40-45%, severe regurgitation mitral valve -possibly candidate for MV clip in the future. Likely high risk for surgical MV repair (per cardiology)  Consultants Cardiology CT surgery  Procedures  Echocardiogram  Cardiac MRI  Discharge Exam: Vitals:   09/11/17 0742 09/11/17 0940  BP: 96/61 (!) 88/59  Pulse:    Resp: 16 16  Temp: (!) 97.3 F (36.3 C)   SpO2: 97%     Feels breathing has improved. Denies current chest pain, abdominal  pain, N/V/D/C.    General: Well developed, well nourished, NAD, appears stated age  HEENT: NCAT, mucous membranes moist.  Cardiovascular: S1 S2 auscultated, 3/6 SEM, RRR.  Respiratory: Clear to auscultation bilaterally with equal chest rise  Abdomen: Soft, nontender, nondistended, + bowel sounds  Extremities: warm dry without cyanosis clubbing or edema  Neuro: AAOx3, nonfocal  Psych: Appropriate mood and affect, pleasant   Discharge Instructions Discharge Instructions    Discharge instructions   Complete by:  As directed    Patient will be discharged to home.  Patient will need to follow up with primary care provider within one week of discharge, repeat BMP.  Follow up with Dr. [name], cardiology, on 09/20/2017. Patient should continue medications as prescribed.  Patient should follow a heart healthy diet.     Allergies as of 09/11/2017      Reactions   Tramadol Shortness Of Breath, Palpitations, Other (See Comments)      Medication List    STOP taking these medications   metoprolol tartrate 25 MG tablet Commonly known as:  LOPRESSOR   potassium chloride 10 MEQ CR capsule Commonly known as:  MICRO-K     TAKE these medications   acetaminophen 500 MG tablet Commonly known as:  TYLENOL Take 1,000 mg by mouth every 6 (six) hours as needed for headache (pain).   aspirin 81 MG tablet Take 1 tablet (81 mg total) daily by mouth.   butalbital-acetaminophen-caffeine 50-325-40 MG tablet Commonly known as:  FIORICET, ESGIC Take 1 tablet by mouth every 12 (twelve) hours as needed for headache.   clopidogrel 75 MG tablet Commonly known as:  PLAVIX Take 1 tablet (75 mg total) by mouth daily.   feeding supplement (ENSURE ENLIVE) Liqd Take 237 mLs by mouth 2 (two) times daily between meals.   furosemide 80 MG tablet Commonly known as:  LASIX Take 1 tablet (80 mg total) by mouth 2 (two) times daily. What  changed:    medication strength  how much to take   levothyroxine 25 MCG tablet Commonly known as:  SYNTHROID, LEVOTHROID Take 25 mcg by mouth daily before breakfast.   potassium chloride SA 20 MEQ tablet Commonly known as:  K-DUR,KLOR-CON Take 1 tablet (20 mEq total) by mouth 2 (two) times daily.   pravastatin 80 MG tablet Commonly known as:  PRAVACHOL Take 1 tablet (80 mg total) by mouth at bedtime.      Allergies  Allergen Reactions  . Tramadol Shortness Of Breath, Palpitations and Other (See Comments)   Follow-up Information    Polite, Ronald, MD. Schedule an appointment as soon as possible for a visit in 1 week(s).   Specialty:  Internal Medicine Why:  Hospital follow up Contact information: [address] [place] [address] [phone]        Larey Dresser, MD .   Specialty:  Cardiology Contact information: [address] [place] [address] [phone]            The results of significant diagnostics from this hospitalization (including imaging, microbiology, ancillary and laboratory) are listed below for reference.    Significant Diagnostic Studies: Dg Chest 2 View  Result Date: 09/07/2017 CLINICAL DATA:  Shortness of breath. EXAM: CHEST  2 VIEW COMPARISON:  May 08, 2017 FINDINGS: Stable cardiomegaly. A loop recorder overlies the left side of the heart. The hila and mediastinum are normal. No pulmonary nodules or masses. No focal infiltrates identified. IMPRESSION: No active cardiopulmonary disease. Electronically Signed  By: Dorise Bullion III M.D   On: 09/07/2017 18:35   Mr Cardiac Morphology W Wo Contrast  Result Date: 09/09/2017 CLINICAL DATA:  Cardiac amyloidosis EXAM: CARDIAC MRI TECHNIQUE: The patient was scanned on a 1.5 Tesla GE magnet. A dedicated cardiac coil was used. Functional imaging was done using Fiesta sequences. 2,3, and 4 chamber views were done to assess for RWMA's. Modified Simpson's  rule using a short axis stack was used to calculate an ejection fraction on a dedicated work Conservation officer, nature. The patient received 20 cc of Multihance. After 10 minutes inversion recovery sequences were used to assess for infiltration and scar tissue. CONTRAST:  20 cc Multihance contrast FINDINGS: Limited images of the lung fields showed no gross abnormalities. There was a small circumferential pericardial effusion. Normal left ventricular size with moderate concentric LV hypertrophy. EF 45% with diffuse hypokinesis. Mildly dilated right ventricle with mildly decreased systolic function. Severe left atrial enlargement. Severe right atrial enlargement. Moderate tricuspid regurgitation. The aortic valve was trileaflet with no significant regurgitation or stenosis. Visually, mitral regurgitation appeared severe. However, regurgitant fraction 41% suggests moderate mitral regurgitation. On delayed enhancement imaging, there was extensive late gadolinium enhancement (LGE) involving the free walls of both atria and the interatrial septum, the right ventricle, and extensive subendocardial LGE throughout the left ventricle. Measurements: LVEDV 154 mL LVSV 69 mL LVEF 45% Aortic forward volume 41 mL MR regurgitant fraction 41% IMPRESSION: 1. Normal LV size with moderate LVH, EF 45% with diffuse hypokinesis. 2.  Mildly dilated RV with mildly decreased systolic function. 3. Moderate to severe mitral regurgitation (moderate by calculated regurgitant fraction, severe visually). 4.  Severe biatrial enlargement. 5.  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 Dalton Mclean Electronically Signed   By: Loralie Champagne M.D.   On: 09/09/2017 17:38    Microbiology: Recent Results (from the past 240 hour(s))  MRSA PCR Screening     Status: None   Collection Time: 09/08/17  8:17 PM  Result Value Ref Range Status   MRSA by PCR NEGATIVE NEGATIVE  Final    Comment:        The GeneXpert MRSA Assay (FDA approved for NASAL specimens only), is one component of a comprehensive MRSA colonization surveillance program. It is not intended to diagnose MRSA infection nor to guide or monitor treatment for MRSA infections. Performed at [facility], [place] [address] [place], [place] [address]      Labs: Basic Metabolic Panel: Recent Labs  Lab 09/07/17 1741 09/08/17 0356 09/09/17 0320 09/10/17 0230 09/11/17 0501  NA 141 144 140 140 141  K 3.7 3.7 4.2 3.6 3.8  CL 107 107 104 100* 102  CO2 23 26 25 29 27   GLUCOSE 136* 98 85 90 88  BUN 21* 17 20 26* 22*  CREATININE 1.38* 1.36* 1.43* 1.44* 1.40*  CALCIUM 9.6 9.0 9.1 9.4 9.4   Liver Function Tests: Recent Labs  Lab 09/08/17 0356  AST 28  ALT 25  ALKPHOS 149*  BILITOT 0.7  PROT 6.5  ALBUMIN 3.4*   No results for input(s): LIPASE, AMYLASE in the last 168 hours. No results for input(s): AMMONIA in the last 168 hours. CBC: Recent Labs  Lab 09/07/17 1741 09/08/17 0356 09/09/17 0320 09/10/17 0230 09/11/17 0501  WBC 4.0 2.9* 3.0* 8.4 4.1  HGB 13.8 12.9 12.7 13.9 13.4  HCT 42.1 40.7 40.2 42.2 42.0  MCV 95.2 95.5 95.9 95.0 95.5  PLT 291 273 254 243 273   Cardiac Enzymes: Recent Labs  Lab 09/08/17 0026 09/08/17 0356 09/08/17 1115 09/08/17 1611  TROPONINI 0.09* 0.10* 0.09* 0.09*   BNP: BNP (last 3 results) Recent Labs    05/08/17 2000 06/14/17 1303 09/07/17 1741  BNP 740.6* 564.0* 673.7*    ProBNP (last 3 results) No results for input(s): PROBNP in the last 8760 hours.  CBG: No results for input(s): GLUCAP in the last 168 hours.     Signed:  Cristal Ford  [facility] 09/11/2017, 11:34 AM

## 2017-09-11 NOTE — Progress Notes (Addendum)
patient in a stable condition, discharge eduction reviewed with patient , patient verbalized understanding,iv removed, tele dc ccmd notified, patient belongings at bedside, patient awaiting her family member to transport her home.

## 2017-09-11 NOTE — Discharge Instructions (Signed)
Heart Failure °Heart failure means your heart has trouble pumping blood. This makes it hard for your body to work well. Heart failure is usually a long-term (chronic) condition. You must take good care of yourself and follow your doctor's treatment plan. °Follow these instructions at home: °· Take your heart medicine as told by your doctor. °? Do not stop taking medicine unless your doctor tells you to. °? Do not skip any dose of medicine. °? Refill your medicines before they run out. °? Take other medicines only as told by your doctor or pharmacist. °· Stay active if told by your doctor. The elderly and people with severe heart failure should talk with a doctor about physical activity. °· Eat heart-healthy foods. Choose foods that are without trans fat and are low in saturated fat, cholesterol, and salt (sodium). This includes fresh or frozen fruits and vegetables, fish, lean meats, fat-free or low-fat dairy foods, whole grains, and high-fiber foods. Lentils and dried peas and beans (legumes) are also good choices. °· Limit salt if told by your doctor. °· Cook in a healthy way. Roast, grill, broil, bake, poach, steam, or stir-fry foods. °· Limit fluids as told by your doctor. °· Weigh yourself every morning. Do this after you pee (urinate) and before you eat breakfast. Write down your weight to give to your doctor. °· Take your blood pressure and write it down if your doctor tells you to. °· Ask your doctor how to check your pulse. Check your pulse as told. °· Lose weight if told by your doctor. °· Stop smoking or chewing tobacco. Do not use gum or patches that help you quit without your doctor's approval. °· Schedule and go to doctor visits as told. °· Nonpregnant women should have no more than 1 drink a day. Men should have no more than 2 drinks a day. Talk to your doctor about drinking alcohol. °· Stop illegal drug use. °· Stay current with shots (immunizations). °· Manage your health conditions as told by your  doctor. °· Learn to manage your stress. °· Rest when you are tired. °· If it is really hot outside: °? Avoid intense activities. °? Use air conditioning or fans, or get in a cooler place. °? Avoid caffeine and alcohol. °? Wear loose-fitting, lightweight, and light-colored clothing. °· If it is really cold outside: °? Avoid intense activities. °? Layer your clothing. °? Wear mittens or gloves, a hat, and a scarf when going outside. °? Avoid alcohol. °· Learn about heart failure and get support as needed. °· Get help to maintain or improve your quality of life and your ability to care for yourself as needed. °Contact a doctor if: °· You gain weight quickly. °· You are more short of breath than usual. °· You cannot do your normal activities. °· You tire easily. °· You cough more than normal, especially with activity. °· You have any or more puffiness (swelling) in areas such as your hands, feet, ankles, or belly (abdomen). °· You cannot sleep because it is hard to breathe. °· You feel like your heart is beating fast (palpitations). °· You get dizzy or light-headed when you stand up. °Get help right away if: °· You have trouble breathing. °· There is a change in mental status, such as becoming less alert or not being able to focus. °· You have chest pain or discomfort. °· You faint. °This information is not intended to replace advice given to you by your health care provider. Make sure you   discuss any questions you have with your health care provider. °Document Released: 04/27/2008 Document Revised: 12/25/2015 Document Reviewed: 09/04/2012 °Elsevier Interactive Patient Education © 2017 [employer] ° °

## 2017-09-16 ENCOUNTER — Ambulatory Visit (INDEPENDENT_AMBULATORY_CARE_PROVIDER_SITE_OTHER): Payer: Medicare Other | Admitting: *Deleted

## 2017-09-16 DIAGNOSIS — I639 Cerebral infarction, unspecified: Secondary | ICD-10-CM | POA: Diagnosis not present

## 2017-09-19 NOTE — Progress Notes (Signed)
Carelink Summary Report / Loop Recorder 

## 2017-09-20 ENCOUNTER — Ambulatory Visit (HOSPITAL_COMMUNITY)
Admission: RE | Admit: 2017-09-20 | Discharge: 2017-09-20 | Disposition: A | Discharge status: Home / Self Care | Payer: Medicare Other | Source: Ambulatory Visit | Attending: Cardiology | Admitting: Cardiology

## 2017-09-20 ENCOUNTER — Telehealth: Payer: Self-pay | Admitting: Cardiology

## 2017-09-20 ENCOUNTER — Encounter (HOSPITAL_COMMUNITY): Payer: Self-pay | Admitting: Cardiology

## 2017-09-20 VITALS — BP 118/74 | HR 83 | Wt 146.0 lb

## 2017-09-20 DIAGNOSIS — I130 Hypertensive heart and chronic kidney disease with heart failure and stage 1 through stage 4 chronic kidney disease, or unspecified chronic kidney disease: Secondary | ICD-10-CM | POA: Insufficient documentation

## 2017-09-20 DIAGNOSIS — Z1379 Encounter for other screening for genetic and chromosomal anomalies: Secondary | ICD-10-CM

## 2017-09-20 DIAGNOSIS — I5032 Chronic diastolic (congestive) heart failure: Secondary | ICD-10-CM | POA: Diagnosis not present

## 2017-09-20 DIAGNOSIS — I43 Cardiomyopathy in diseases classified elsewhere: Secondary | ICD-10-CM | POA: Insufficient documentation

## 2017-09-20 DIAGNOSIS — E854 Organ-limited amyloidosis: Secondary | ICD-10-CM | POA: Diagnosis not present

## 2017-09-20 DIAGNOSIS — I340 Nonrheumatic mitral (valve) insufficiency: Secondary | ICD-10-CM

## 2017-09-20 DIAGNOSIS — Z79899 Other long term (current) drug therapy: Secondary | ICD-10-CM | POA: Insufficient documentation

## 2017-09-20 DIAGNOSIS — N183 Chronic kidney disease, stage 3 unspecified: Secondary | ICD-10-CM

## 2017-09-20 DIAGNOSIS — G63 Polyneuropathy in diseases classified elsewhere: Secondary | ICD-10-CM

## 2017-09-20 DIAGNOSIS — Z7902 Long term (current) use of antithrombotics/antiplatelets: Secondary | ICD-10-CM | POA: Insufficient documentation

## 2017-09-20 DIAGNOSIS — G629 Polyneuropathy, unspecified: Secondary | ICD-10-CM | POA: Diagnosis not present

## 2017-09-20 DIAGNOSIS — Z7982 Long term (current) use of aspirin: Secondary | ICD-10-CM | POA: Insufficient documentation

## 2017-09-20 DIAGNOSIS — E851 Neuropathic heredofamilial amyloidosis: Secondary | ICD-10-CM | POA: Diagnosis not present

## 2017-09-20 LAB — BASIC METABOLIC PANEL
Anion gap: 12 (ref 5–15)
BUN: 30 mg/dL — ABNORMAL HIGH (ref 6–20)
CO2: 30 mmol/L (ref 22–32)
Calcium: 10.2 mg/dL (ref 8.9–10.3)
Chloride: 99 mmol/L — ABNORMAL LOW (ref 101–111)
Creatinine, Ser: 1.31 mg/dL — ABNORMAL HIGH (ref 0.44–1.00)
GFR calc Af Amer: 46 mL/min — ABNORMAL LOW (ref >60)
GFR calc non Af Amer: 40 mL/min — ABNORMAL LOW (ref >60)
Glucose, Bld: 97 mg/dL (ref 65–99)
Potassium: 4.7 mmol/L (ref 3.5–5.1)
Sodium: 141 mmol/L (ref 135–145)

## 2017-09-20 MED ORDER — CARVEDILOL 3.125 MG PO TABS: 6.2500 mg | ORAL_TABLET | Freq: Two times a day (BID) | ORAL | 3 refills | Status: DC

## 2017-09-20 NOTE — Telephone Encounter (Signed)
Pt called answering service with a medication question. She was told to take carvedilol 3.125 mg twice a day. When she picked up the medication from the pharmacy is said to take 2 tablets twice a day. The office note says to start 3.125 mg bid. I advised the patient to take one pill tonight and one in the morning and I will route this to Dr. [name] and his nurse for clarification. The patient is expecting a call tomorrow to clarify.   Katie [name], AGNP-C [facility] 09/20/2017  6:04 PM

## 2017-09-20 NOTE — Telephone Encounter (Signed)
Tell her to take Coreg 3.125 mg bid.

## 2017-09-20 NOTE — Progress Notes (Signed)
PCP: Dr. [name] HF Cardiology: Dr. [name]  72 yo with history of chronic diastolic CHF, mitral regurgitation, CVA, and CKD returns for followup of CHF.   Patient was initially admitted with diastolic CHF in 7/54. Echo showed EF 60-65% with grade 3 diastolic dysfunction and moderate MR.  She had cath showing no coronary disease. 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  TEE this admission showed moderate-severe MR.  She was admitted again in 10/18 with acute on chronic diastolic CHF. Echo this admission showed EF 50-55%, severe LVH, restrictive diastolic dysfunction, severe MR, moderate-severe TR.  She was referred to Dr. [name] who saw her recently for mitral valve evaluation.  Upon review of studies, regurgitation thought to be more in the moderate range and likely functional.   Patient had PYP amyloid scan in 12/18, this was suggestive of transthyretin amyloidosis.  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   Recent admission for A/C diastolic HF 4/9-2/08/69. Required IV diuresis, then transitioned to 80 mg PO lasix BID. Metop was DC'd due to low blood pressures. Cardiac MRI strongly suggestive of amyloidosis, moderate to severe MR (moderate by regurgitant fraction 41% but severe visually). Echo repeated: EF 40-45%, moderate to severe LVH, severe MR, severe dilation of LA and RA, moderate TR, PASP 49.  She returns today for HF follow up. Overall feeling good. Weight down 8 lbs since last appointment here. Taking lasix 80 mg BID now. Urinating a lot. Has intermittent LUE numbness and tingling that started yesterday. Also has numbness and tingling in her toes. SOB with minimal activity, like walking into her house. No SOB with ADL's. No chest pain or palpitations. No dizziness. No orthopnea or PND. Taking all meds as prescribed. Appetite okay. Avoids salt. Drinks less than 2 L per day. Checks BP at home ~130/90. Energy level good.   ECG (10/18):  NSR, low voltage in limb leads, nonspecific T wave flattening.   Labs (10/18): K 4.4, creatinine 1.27, LDL 56, HDL 39 Labs (11/18): K 3.9, creatinine 1.43, SPEP negative Labs (2/19): K 3.8, creatinine 1.40, hgb 13.4  PMH: 1. HTN 2. Hyperlipidemia 3. 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  LINQ monitor placed.  4. Chronic diastolic CHF:  - LHC (2/19): Normal coronaries.  - Echo (10/18): EF 50-55%, severe LVH with speckled myocardium concerning for amyloidosis, restrictive diastolic function with septal flattening, severe MR, moderate-severe TR, severe biatrial enlargement, PASP 54 mmHg.  - PYP amyloid scan: Grade 3, suggestive of transthyretin amyloidosis.  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  5. Mitral regurgitation: Think mixed functional/degenerative - TEE (7/18): EF 45-50%, moderate-severe MR, mild-moderate TR.  - Echo (10/18) read as severe MR.  - 2/19 echo with severe MR.  - 2/19 cardiac MRI with moderate to severe MR.  6. CKD stage 3 7. Hypothyroidism 8. H/o renal cell CA s/p nephrectomy.   Social History   Socioeconomic History  . Marital status: Divorced    Spouse name: Not on file  . Number of children: Not on file  . Years of education: Not on file  . Highest  education level: Not on file  Social Needs  . Financial resource strain: Not on file  . Food insecurity - worry: Not on file  . Food insecurity - inability: Not on file  . Transportation needs - medical: Not on file  . Transportation needs - non-medical: Not on file  Occupational History  . Not  on file  Tobacco Use  . Smoking status: Never Smoker  . Smokeless tobacco: Never Used  Substance and Sexual Activity  . Alcohol use: No  . Drug use: No  . Sexual activity: No  Other Topics Concern  . Not on file  Social History Narrative  . Not on file   Family History  Problem Relation Age of Onset  . Stroke Brother   . Diabetes Mellitus II Sister   . Colon cancer Sister    ROS: All systems reviewed and negative except as per HPI.   Current Outpatient Medications  Medication Sig Dispense Refill  . acetaminophen (TYLENOL) 500 MG tablet Take 1,000 mg by mouth every 6 (six) hours as needed for headache (pain).    Marland Kitchen aspirin 81 MG tablet Take 1 tablet (81 mg total) daily by mouth.    . butalbital-acetaminophen-caffeine (FIORICET, ESGIC) 50-325-40 MG tablet Take 1 tablet by mouth every 12 (twelve) hours as needed for headache. 14 tablet 0  . clopidogrel (PLAVIX) 75 MG tablet Take 1 tablet (75 mg total) by mouth daily. 30 tablet 1  . feeding supplement, ENSURE ENLIVE, (ENSURE ENLIVE) LIQD Take 237 mLs by mouth 2 (two) times daily between meals. 237 mL 0  . furosemide (LASIX) 80 MG tablet Take 1 tablet (80 mg total) by mouth 2 (two) times daily. 60 tablet 0  . levothyroxine (SYNTHROID, LEVOTHROID) 25 MCG tablet Take 25 mcg by mouth daily before breakfast.    . potassium chloride SA (K-DUR,KLOR-CON) 20 MEQ tablet Take 1 tablet (20 mEq total) by mouth 2 (two) times daily. 60 tablet 0  . pravastatin (PRAVACHOL) 80 MG tablet Take 1 tablet (80 mg total) by mouth at bedtime. 30 tablet 11   No current facility-administered medications for this encounter.    BP 118/74   Pulse 83   Wt 146 lb (66.2 kg)   LMP 02/12/2013   SpO2 97%   BMI 25.86 kg/m  General:No resp difficulty. HEENT: Normal Neck: Supple. JVP not elevated. Carotids 2+ bilat; no bruits. No thyromegaly or nodule noted. Cor: PMI nondisplaced. RRR, no S3/S4, 1/6 HSM at apex Lungs: CTAB, normal effort. Abdomen: Soft,  non-tender, non-distended, no HSM. No bruits or masses. +BS  Extremities: No cyanosis, clubbing, or rash. R and LLE no edema.  Neuro: Alert & orientedx3, cranial nerves grossly intact. moves all 4 extremities w/o difficulty. Affect pleasant  Assessment/Plan: 1. Chronic diastolic CHF: Normal EF on 10/18 echo with severe LVH and speckled myocardium consistent with cardiac amyloidosis.  SPEP was negative. 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 Cardiac MRI also strongly suggestive of cardiac amyloidosis.  On exam today,she does not appear significantly volume overloaded. NYHA class III symptoms.  - Continue Lasix 80 mg BID, BMET today.  - EF mildly decreased, restart carvedilol 3.125 mg BID. - We are working to get Biochemist, clinical for [employer] device.  2. Mitral regurgitation: Mitral regurgitation seems to fluctuate somewhat based on loading conditions.  Most recently, echo in 2/19 suggested severe MR and cardiac MRI suggested moderate to severe MR (moderate by 41% regurgitant fraction but visually  severe). I suspect that the etiology is mixed - functional as well as degenerative with thickening and restriction of posterior leaflet. When she is volume overloaded, her murmur is more prominent.  Today, MR murmur is not prominent.  - Refer to Dr. Burt Knack for consideration of [facility].  With amyloidosis, would try to avoid surgery.  3. CKD: Stage III.  Follow closely with diuresis. She saw nephrologist earlier this week. Per patient, she is doing well.  - BMET today  4. CVA: 7/18. Has LINQ monitor, no atrial fibrillation detected so far.  - Continue ASA 81.   - Continue Plavix. - Continue statin, lipids ok 09/2017 5. 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  PND score = 1.  - Refer to neurologist for formal evaluation of neuropathy.   6. Cardiac amyloidosis: Genetic TTR amyloidosis.  We are trying to arrange for patisiran treatment.  Will need neurology evaluation.   - I will refer to Dr Broadus John for genetic counseling, family can be tested for her amyloidosis gene mutation.   Follow up in 2 weeks  Georgiana Shore, NP-C 09/20/2017 10:41 AM   Patient seen with NP, agree with the above note.  I have made adjustments to the note to reflect my thoughts.  NYHA class III.   On exam, she is not volume overloaded.  MR murmur is soft.    Continue current Lasix as she looks near euvolemic and weight is down. Adding Coreg 3.125 mg bid.   She will likely remain tenuous with TTR amyloidosis and mitral regurgitation.   - We are working on patisiran treatment => will need neurology evaluation of her peripheral neuropathy to allow treatment (has PND score = 1).   - We are working on getting her approved by insurance for [facility].  - Mitral regurgitation seems mixed functional and degenerative.  Appears to vary with loading conditions (more volume overload leads to more MR).  Currently, murmur is not prominent. I am going to refer her for Mitraclip evaluation with Dr. Burt Knack.  - She will need referral for genetics counseling with Dr. Broadus John (family can be tested for genetic amyloidosis).    Loralie Champagne 09/20/2017

## 2017-09-20 NOTE — Patient Instructions (Addendum)
Start Carvedilol 3.125 mg (1 tab), twice a day  Labs drawn today (if we do not call you, then your lab work was stable)   You have been referred to Dr. Burt Knack for Mitral Valve Clip  You have been referred to Dr. Broadus John for Genetic Evaluation  You have been referred to Dr. Posey Pronto for Neuropathy   Your physician recommends that you schedule a follow-up appointment in: 2 weeks with [facility]

## 2017-09-21 ENCOUNTER — Other Ambulatory Visit: Payer: Self-pay | Admitting: Cardiology

## 2017-09-27 NOTE — Progress Notes (Signed)
Cardiology Office Note Date:  09/28/2017   ID:  Katie, [name] January 08, 1946, MRN [id number]  PCP:  Seward Carol, MD  Cardiologist:  Liam Rogers, MD HF MD: Dr [name]  Chief Complaint  Patient presents with  . Mitraclip consult    Referred by Dr. [name]     History of Present Illness: Katie [name] is a 72 y.o. female who presents for evaluation of mitral regurgitation.  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  The patient has been hospitalized a number of times over the last 2 years of heart failure, most recently 2 weeks ago and prior to that in October 2018.  The patient was first diagnosed with diastolic heart failure in 2017.  At that time an echocardiogram showed preserved LV systolic function with an ejection fraction of 82%, grade 3 diastolic dysfunction, and moderate mitral regurgitation.  She underwent left heart catheterization demonstrating normal coronary arteries.  The patient was readmitted in 2018 and underwent a repeat echocardiogram that demonstrated severe LVH, restrictive diastolic dysfunction, and severe mitral regurgitation.  She was referred for cardiac surgical evaluation as an outpatient for consideration of mitral and tricuspid valve surgery.  It was felt that she had moderately severe mitral regurgitation with room to escalate her medical therapy and she was then referred to the advanced heart failure clinic.  She has been followed closely by Dr. [name] since that time.  A cardiac MRI was ordered and demonstrated findings highly suggestive of cardiac amyloid.  Visually her mitral regurgitation appeared severe but her regurgitant fraction of 41% suggested moderate MR.  The patient is here alone today. States she has 'good days and  bad days.' She is fairly independent, still drives a car, and is able to grocery shop. Most days she admits that she stays in her home. She has shortness of breath with activity, some days worse than others. Denies orthopnea, PND. Admits to leg swelling but states it is improving on her diuretic regimen.   Past Medical History:  Diagnosis Date  . Chronic combined systolic and diastolic CHF (congestive heart failure) ([place])   . CKD (chronic kidney disease), stage III ([place])    removal of right kidney due to carcinoma  . Headache   . History of kidney cancer    a. s/p right nephrectomy ~2007.  Marland Kitchen History of loop recorder   . Hypercholesterolemia   . Hypertension   . Mitral regurgitation   . Pulmonary hypertension ([place])   . Stroke [facility])     Past Surgical History:  Procedure Laterality Date  . CARDIAC CATHETERIZATION N/A 01/12/2016   Procedure: Right/Left Heart Cath and Coronary/Graft Angiography;  Surgeon: Belva Crome, MD;  Location: [facility];  Service: Cardiovascular;  Laterality: N/A;  . CERVICAL POLYPECTOMY N/A 04/23/2013   Procedure: CERVICAL POLYPECTOMY;  Surgeon: Maeola Sarah. Landry Mellow, MD;  Location: [facility];  Service: Gynecology;  Laterality: N/A;  Possible Polypectomy  . HYSTEROSCOPY W/D&C N/A 04/23/2013   Procedure: DILATATION AND CURETTAGE /HYSTEROSCOPY;  Surgeon: Maeola Sarah. Landry Mellow, MD;  Location: [facility];  Service: Gynecology;  Laterality: N/A;  . IR ANGIO INTRA EXTRACRAN SEL COM CAROTID INNOMINATE BILAT MOD SED  02/11/2017  . IR ANGIO VERTEBRAL SEL VERTEBRAL BILAT MOD SED  02/11/2017  . KNEE ARTHROSCOPY Left   .  LOOP RECORDER INSERTION N/A 02/18/2017   Procedure: Loop Recorder Insertion;  Surgeon: Constance Haw, MD;  Location: [place] [facility];  Service: Cardiovascular;  Laterality: N/A;  . NEPHRECTOMY Right    due to carcinoma  . TEE WITHOUT CARDIOVERSION N/A 02/18/2017   Procedure: TRANSESOPHAGEAL ECHOCARDIOGRAM (TEE) WITH LOOP;  Surgeon: Acie Fredrickson Wonda Cheng, MD;  Location: [facility]  ENDOSCOPY;  Service: Cardiovascular;  Laterality: N/A;    Current Outpatient Medications  Medication Sig Dispense Refill  . acetaminophen (TYLENOL) 500 MG tablet Take 1,000 mg by mouth every 6 (six) hours as needed for headache (pain).    Marland Kitchen aspirin 81 MG tablet Take 1 tablet (81 mg total) daily by mouth.    . butalbital-acetaminophen-caffeine (FIORICET, ESGIC) 50-325-40 MG tablet Take 1 tablet by mouth every 12 (twelve) hours as needed for headache. 14 tablet 0  . carvedilol (COREG) 3.125 MG tablet Take 2 tablets (6.25 mg total) by mouth 2 (two) times daily. 60 tablet 3  . clopidogrel (PLAVIX) 75 MG tablet Take 1 tablet (75 mg total) by mouth daily. 30 tablet 1  . feeding supplement, ENSURE ENLIVE, (ENSURE ENLIVE) LIQD Take 237 mLs by mouth 2 (two) times daily between meals. 237 mL 0  . furosemide (LASIX) 80 MG tablet Take 1 tablet (80 mg total) by mouth 2 (two) times daily. 60 tablet 0  . levothyroxine (SYNTHROID, LEVOTHROID) 25 MCG tablet Take 25 mcg by mouth daily before breakfast.    . potassium chloride SA (K-DUR,KLOR-CON) 20 MEQ tablet Take 1 tablet (20 mEq total) by mouth 2 (two) times daily. 60 tablet 0  . pravastatin (PRAVACHOL) 80 MG tablet Take 1 tablet (80 mg total) by mouth at bedtime. 30 tablet 11   No current facility-administered medications for this visit.     Allergies:   Tramadol   Social History:  The patient  reports that  has never smoked. she has never used smokeless tobacco. She reports that she does not drink alcohol or use drugs.   Family History:  The patient's  family history includes Colon cancer in her sister; Diabetes Mellitus II in her sister; Stroke in her brother.   ROS:  Please see the history of present illness.  Otherwise, review of systems is positive for low back pain.  All other systems are reviewed and negative.   PHYSICAL EXAM: VS:  BP 116/66   Pulse 85   Ht 5\' 3"  (1.6 m)   Wt 147 lb 6.4 oz (66.9 kg)   LMP 02/12/2013   BMI 26.11 kg/m  , BMI  Body mass index is 26.11 kg/m. GEN: Well nourished, well developed, in no acute distress  HEENT: normal  Neck: no JVD, no masses. No carotid bruits Cardiac: RRR with a grade 2/6 holosystolic murmur at the apical region only appreciable with the patient in the left lateral decubitus position               Respiratory:  clear to auscultation bilaterally, normal work of breathing GI: soft, nontender, nondistended, + BS MS: no deformity or atrophy  Ext: Trace bilateral pretibial edema Skin: warm and dry, no rash Neuro:  Strength and sensation are intact Psych: euthymic mood, full affect  EKG:  EKG is not ordered today.  Recent Labs: 05/11/2017: Magnesium 2.1 09/07/2017: B Natriuretic Peptide 673.7; TSH 2.932 09/08/2017: ALT 25 09/11/2017: Hemoglobin 13.4; Platelets 273 09/20/2017: BUN 30; Creatinine, Ser 1.31; Potassium 4.7; Sodium 141   Lipid Panel     Component Value Date/Time  CHOL 126 09/08/2017 0356   TRIG 24 09/08/2017 0356   HDL 52 09/08/2017 0356   CHOLHDL 2.4 09/08/2017 0356   VLDL 5 09/08/2017 0356   LDLCALC 69 09/08/2017 0356      Wt Readings from Last 3 Encounters:  09/28/17 147 lb 6.4 oz (66.9 kg)  09/20/17 146 lb (66.2 kg)  09/11/17 146 lb 4.8 oz (66.4 kg)     Cardiac Studies Reviewed: TEE 02-18-2017: ------------------------------------------------------------------- Left ventricle:  Systolic function was mildly reduced. The estimated ejection fraction was in the range of 45% to 50%.  ------------------------------------------------------------------- Aortic valve:   Structurally normal valve.   Cusp separation was normal.  No evidence of vegetation.  Doppler:  There was no regurgitation.  ------------------------------------------------------------------- Aorta:  The aorta was mildly calcified.  ------------------------------------------------------------------- Mitral valve:  There is no significant reversal of flow in the pulmonary veins. Doppler:   There was moderate to severe regurgitation.  ------------------------------------------------------------------- Left atrium:   No evidence of thrombus in the atrial cavity or appendage.  ------------------------------------------------------------------- Tricuspid valve:   The valve appears to be grossly normal. Doppler:  There was mild-moderate regurgitation.   ------------------------------------------------------------------- Post procedure conclusions Ascending Aorta:  - The aorta was mildly calcified.  Echo 09/08/2017: Study Conclusions  - Left ventricle: The cavity size was normal. Wall thickness was   increased in a pattern of moderate to severe LVH. Systolic   function was mildly to moderately reduced. The estimated ejection   fraction was in the range of 40% to 45%. Wall motion was normal;   there were no regional wall motion abnormalities. - Mitral valve: There was severe regurgitation directed   posteriorly. - Left atrium: The atrium was severely dilated. - Right atrium: The atrium was severely dilated. - Tricuspid valve: There was moderate regurgitation. - Pulmonary arteries: Systolic pressure was moderately increased.   PA peak pressure: 49 mm Hg (S).  Cardiac Cath 01/12/2016: Conclusion    Widely patent/normal coronary arteries.  Mildly to moderately reduced left ventricular systolic function with EF 35-40%. Filling pressures are normal. No mitral regurgitation of significance is noted.  Mildly elevated pulmonary artery pressures with mean PA pressure of 23 mmHg.   RECOMMENDATIONS:   Heart failure therapy to be continued by the primary team.  Overall prognosis for improvement is optimistic.  Indications   Acute combined systolic and diastolic heart failure (HCC) [I50.41 (ICD-10-CM)]  Mitral regurgitation [I34.0 (PJK-[id number])]  Complications   Complications documented in old activity   The right femoral area was sterilely prepped and draped.  1% Xylocaine local infiltration was then given for analgesia. Sedation was administered with the combination of Versed and fentanyl.. A Seldinger needle was then used to perform an anterior wall femoral artery stick. A 5 French sheath was inserted using the modified Seldinger technique. The Seldinger needle was then used to perform an anterior and posterior wall stick on the right femoral vein. A 7 French sheath was inserted using the modified Seldinger technique. Right heart catheterization was performed with a 7 French Swan-Ganz catheter. A left main pulmonary artery oximetry samples was obtained. Pressures were recorded and the right atrium, right ventricle, pulmonary artery, and wedge positions. A pullback was performed through the right heart.   Coronary angiography was then performed using a 5 French A2 MP and JR4. Left ventriculography was performed using an angled pigtail. The left ventricle was opacified by power injection at 15 cc/s for a total of 45 cc..   During this procedure the patient was administered a total  of Versed 2 mg and Fentanyl 100 mg to achieve and maintain moderate conscious sedation. The patient's heart rate, blood pressure, and oxygen saturation are monitored continuously during the procedure. The period of conscious sedation is 20 minutes, of which I was present face-to-face 100% of this time.  Estimated blood loss <50 mL. There were no immediate complications during the procedure.      Coronary Findings   Diagnostic  Dominance: Right  Ramus Intermedius  . Vessel is small.  Left Circumflex  First Obtuse Marginal Branch  The vessel is large in size.  Second Obtuse Marginal Branch  The vessel is small in size.  Intervention   No interventions have been documented.  Right Heart   Right Heart Pressures Hemodynamic findings consistent with mild pulmonary hypertension. LV EDP is normal.  Wall Motion              Left Heart   Left Ventricle The left  ventricular ejection fraction is 35-45% by visual estimate.  Coronary Diagrams   Diagnostic Diagram        Cardiac MRI: FINDINGS: Limited images of the lung fields showed no gross abnormalities.  There was a small circumferential pericardial effusion.  Normal left ventricular size with moderate concentric LV hypertrophy. EF 45% with diffuse hypokinesis. Mildly dilated right ventricle with mildly decreased systolic function. Severe left atrial enlargement. Severe right atrial enlargement. Moderate tricuspid regurgitation. The aortic valve was trileaflet with no significant regurgitation or stenosis. Visually, mitral regurgitation appeared severe. However, regurgitant fraction 41% suggests moderate mitral regurgitation.  On delayed enhancement imaging, there was extensive late gadolinium enhancement (LGE) involving the free walls of both atria and the interatrial septum, the right ventricle, and extensive subendocardial LGE throughout the left ventricle.  Measurements:  LVEDV 154 mL  LVSV 69 mL  LVEF 45%  Aortic forward volume 41 mL  MR regurgitant fraction 41%  IMPRESSION: 1. Normal LV size with moderate LVH, EF 45% with diffuse hypokinesis.  2.  Mildly dilated RV with mildly decreased systolic function.  3. Moderate to severe mitral regurgitation (moderate by calculated regurgitant fraction, severe visually).  4.  Severe biatrial enlargement.  5.  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  STS Risk Calculator: Risk of Mortality: 3.930% Renal Failure: 3.873% Permanent Stroke: 6.068% Prolonged Ventilation: 16.173% DSW Infection: 0.038% Reoperation: 7.021% Morbidity or Mortality: 21.972% Short Length of Stay: 18.404% Long Length of Stay: 8.686%   ASSESSMENT AND PLAN: This is a 72 year old woman with chronic combined systolic and diastolic heart failure and 3-4+  mitral regurgitation (Stage D Mitral regurgitation).  The patient's clinical situation is complicated by the presence of cardiac amyloidosis.  She has had recurrent heart failure hospitalizations in spite of close cardiology follow-up and compliance with medical therapy.  She has undergone a cardiac surgical evaluation by Dr. [name] and has a follow-up appointment planned next week.  I have reviewed extensive records, and also have reviewed the cardiac catheterization, surface echo, and TEE images from her most recent studies.  While there is some variability in her degree of mitral regurgitation probably based on loading conditions, it appears to me that she has at least 3+ mitral regurgitation.  I think she has a combination of primary mitral regurgitation with leaflet thickening as well as secondary mitral regurgitation related to her myocardial disease and marked atrial enlargement.  While she will likely continue to have problems related to amyloid heart disease and severe diastolic  dysfunction, it is likely that mitral regurgitation is contributing to her tenuous clinical course.  We discussed consideration of various treatment options today, including ongoing medical therapy, conventional surgical valve repair, and percutaneous therapies such as edge to edge mitral approximation with [employer]. I think her mitral anatomy is suitable for edge-to-edge mitral valve approximation and this might offer her a less invasive approach in the background of her medical problems which include amyloid heart disease, Stage 3 CKD with hx of nephrectomy, and previous stroke. While the patient's STS-PROM with conventional surgery is in the moderate range at 3.9%, I do not think the risk calculator can capture the effect of amyloid heart disease and severe diastolic dysfunction associated with that disease.   I spoke with the patient at length about the [facility] procedure, demonstrated a procedural animation, and  discussed expectations and potential complications with her. I will review her case further with our multidisciplinary team to include Dr [name] and Dr [name]. The patient will return in April for follow-up and will tentatively consider proceeding with MitraClip if the Heart Team agrees this is the most appropriate treatment.   Current medicines are reviewed with the patient today.  The patient does not have concerns regarding medicines.  Labs/ tests ordered today include:  No orders of the defined types were placed in this encounter.   Disposition:   FU April 2019  SignedSherren Mocha, MD  09/28/2017 9:19 AM    [facility] [place], [place], [place]  [address] Phone: [phone]; Fax: [phone]

## 2017-09-28 ENCOUNTER — Encounter: Payer: Self-pay | Admitting: Cardiovascular Disease

## 2017-09-28 ENCOUNTER — Other Ambulatory Visit: Payer: Self-pay

## 2017-09-28 ENCOUNTER — Ambulatory Visit (INDEPENDENT_AMBULATORY_CARE_PROVIDER_SITE_OTHER): Payer: Medicare Other | Admitting: Cardiovascular Disease

## 2017-09-28 VITALS — BP 116/66 | HR 85 | Ht 63.0 in | Wt 147.4 lb

## 2017-09-28 DIAGNOSIS — I340 Nonrheumatic mitral (valve) insufficiency: Secondary | ICD-10-CM | POA: Diagnosis not present

## 2017-09-28 NOTE — Patient Outreach (Signed)
[facility]) Care Management  [place]  09/28/2017   Katie [name] . 1945/12/18 [id number]  Subjective: Telephone call to the patient for a monthly assessment. HIPAA verified.  The patient states that she is doing ok.  The patient states that she was in the hospital on 2/62019 to 09-11-2017 for acute diastolic CHF exacerbation. She denies any pain, no swelling and her weight today is 145 lb.  Per the  patient  she had an appointment with Dr Charlett Blake on the 25th of this month. She has an appointment with Dr Burt Knack to discuss valve surgery. She has an appointment with Dr Ricard Dillon on 10-03-2017, Dr. [name] on 3/5.  She states that she had a fall last Thursday taking the trash to the road.   Her foot got tangled in wire and fell on her  Left knee. She states that it was swollen some but she said that she called her physician office and let them know about her situation.  She states that she is adherent with her medications but is confused about her carvedilol and the instructions on how to take it. [name] advised the patient to call her cardiologist office and asked for clarification of her medication.  The patient stated that she will call and get clarification.    Encounter Medications:  Outpatient Encounter Medications as of 09/28/2017  Medication Sig  . acetaminophen (TYLENOL) 500 MG tablet Take 1,000 mg by mouth every 6 (six) hours as needed for headache (pain).  Marland Kitchen aspirin 81 MG tablet Take 1 tablet (81 mg total) daily by mouth.  . carvedilol (COREG) 3.125 MG tablet Take 2 tablets (6.25 mg total) by mouth 2 (two) times daily.  . clopidogrel (PLAVIX) 75 MG tablet Take 1 tablet (75 mg total) by mouth daily.  . furosemide (LASIX) 80 MG tablet Take 1 tablet (80 mg total) by mouth 2 (two) times daily.  Marland Kitchen levothyroxine (SYNTHROID, LEVOTHROID) 25 MCG tablet Take 25 mcg by mouth daily before breakfast.  . potassium chloride SA (K-DUR,KLOR-CON) 20 MEQ tablet Take 1 tablet (20 mEq  total) by mouth 2 (two) times daily.  . pravastatin (PRAVACHOL) 80 MG tablet Take 1 tablet (80 mg total) by mouth at bedtime.  . butalbital-acetaminophen-caffeine (FIORICET, ESGIC) 50-325-40 MG tablet Take 1 tablet by mouth every 12 (twelve) hours as needed for headache. (Patient not taking: Reported on 09/28/2017)  . feeding supplement, ENSURE ENLIVE, (ENSURE ENLIVE) LIQD Take 237 mLs by mouth 2 (two) times daily between meals.   No facility-administered encounter medications on file as of 09/28/2017.     Functional Status:  In your present state of health, do you have any difficulty performing the following activities: 09/08/2017 05/08/2017  Hearing? N N  Vision? Y N  Comment "left eye gets blurry sometimes" -  Difficulty concentrating or making decisions? N N  Walking or climbing stairs? Y N  Comment "I don't do stairs; I get short of breath walking" -  Dressing or bathing? N N  Doing errands, shopping? N N  Preparing Food and eating ? - -  Using the Toilet? - -  In the past six months, have you accidently leaked urine? - -  Do you have problems with loss of bowel control? - -  Managing your Medications? - -  Managing your Finances? - -  Housekeeping or managing your Housekeeping? - -  Some recent data might be hidden    Fall/Depression Screening: Fall Risk  09/28/2017 08/30/2017 07/28/2017  Falls in the past year? Yes No No  Number falls in past yr: 1 - -  Injury with Fall? - - -  Risk for fall due to : - - -  Follow up Falls prevention discussed - -   PHQ 2/9 Scores 07/28/2017 03/03/2017 02/22/2017  PHQ - 2 Score 0 0 0    Assessment: Patient continues to benefit from health [name] outreach for disease management and support.   THN CM Care Plan Problem One     Most Recent Value  THN Long Term Goal   In the next 90 days the patient will not be admitted to the hospital  [place] Term Goal Start Date  09/28/17  Interventions for Problem One Long Term Goal  [facility] discussed with the  paitent abut the importance of recording her weigh , diet observing for swelling and taking hermedications as prescribed   THN CM Short Term Goal #1   In the next 30 days the patient will verbalize monitoring edema in legs and feet  THN CM Short Term Goal #1 Start Date  09/28/17  Interventions for Short Term Goal #1  [facility] reinterated to thepatient about monitoring her edmea and how it can affect her CHF  THN CM Short Term Goal #2   In the next 30 days the patient will verbalize compliance with her medications  THN CM Short Term Goal #2 Start Date  09/28/17  Interventions for Short Term Goal #2  [facility] discussed with the patient about medication adherenc and clarifying her medication instructions.     Plan: [name] will contact patient in the month of March  and patient agrees to next outreach.           [name] will call and follow up with the patient in one business day.  Lazaro Arms RN, BSN, [place] Direct Dial:  [phone] Fax: [phone]

## 2017-09-28 NOTE — Patient Instructions (Signed)
Medication Instructions:  Your provider recommends that you continue on your current medications as directed. Please refer to the Current Medication list given to you today.    Labwork: No new orders  Testing/Procedures: No new orders  Follow-Up: You have an appointment November 14, 2017 at 8:40AM with Dr. Burt Knack.  Any Other Special Instructions Will Be Listed Below (If Applicable).     If you need a refill on your cardiac medications before your next appointment, please call your pharmacy.

## 2017-09-29 ENCOUNTER — Telehealth (HOSPITAL_COMMUNITY): Payer: Self-pay | Admitting: *Deleted

## 2017-09-29 ENCOUNTER — Encounter (HOSPITAL_COMMUNITY): Payer: Self-pay | Admitting: *Deleted

## 2017-09-29 ENCOUNTER — Other Ambulatory Visit: Payer: Self-pay

## 2017-09-29 ENCOUNTER — Ambulatory Visit: Payer: Medicare Other | Admitting: Genetic Counselor

## 2017-09-29 DIAGNOSIS — I5032 Chronic diastolic (congestive) heart failure: Secondary | ICD-10-CM

## 2017-09-29 MED ORDER — CARVEDILOL 3.125 MG PO TABS: 3.1250 mg | ORAL_TABLET | Freq: Two times a day (BID) | ORAL | 3 refills | Status: DC

## 2017-09-29 NOTE — Patient Outreach (Signed)
[facility]) Care Management  09/29/2017  Katie [name] November 15, 1945 [id number]   Telephone called to the patient for follow up on medication instructions. Patient answered the phone and stated she was in the doctors office and could not talk long. She did call her physician office to clarify her medication instructions.  Per chart review the patient was correct that she was suppose to be taking a lower dose of her carvedilol 3.125 bid.  The prescription that was sent to the pharmacy stated that she was to take 6.25 bid.  The medication was corrected in the [facility].  Plan: RN Health Coach will make outreach to he patient at the regular interval.  Lazaro Arms RN, BSN, [place]:  [phone] Fax: [phone]

## 2017-09-29 NOTE — Telephone Encounter (Signed)
Safety Zone complete

## 2017-09-29 NOTE — Telephone Encounter (Signed)
Patient called asking what dose of carvedilol she was suppose to be taking.  Medication was sent to pharmacy as 6.25 BID.  Patient is suppose to be taking 3.125 mg BID per Dr. [name].  Patient is aware to take 3.125 mg BID.  Medication corrected in MAR.

## 2017-10-03 ENCOUNTER — Encounter: Payer: Self-pay | Admitting: Thoracic Surgery (Cardiothoracic Vascular Surgery)

## 2017-10-03 ENCOUNTER — Ambulatory Visit (INDEPENDENT_AMBULATORY_CARE_PROVIDER_SITE_OTHER): Payer: Medicare Other | Admitting: Thoracic Surgery (Cardiothoracic Vascular Surgery)

## 2017-10-03 VITALS — BP 117/79 | HR 82 | Resp 20 | Ht 63.0 in | Wt 147.0 lb

## 2017-10-03 DIAGNOSIS — E854 Organ-limited amyloidosis: Secondary | ICD-10-CM | POA: Diagnosis not present

## 2017-10-03 DIAGNOSIS — I340 Nonrheumatic mitral (valve) insufficiency: Secondary | ICD-10-CM

## 2017-10-03 DIAGNOSIS — I071 Rheumatic tricuspid insufficiency: Secondary | ICD-10-CM | POA: Diagnosis not present

## 2017-10-03 DIAGNOSIS — I272 Pulmonary hypertension, unspecified: Secondary | ICD-10-CM

## 2017-10-03 DIAGNOSIS — I43 Cardiomyopathy in diseases classified elsewhere: Secondary | ICD-10-CM

## 2017-10-03 NOTE — Patient Instructions (Signed)
Continue all previous medications without any changes at this time  

## 2017-10-03 NOTE — Progress Notes (Signed)
[address]       [place] [address]             [phone]     CARDIOTHORACIC SURGERY OFFICE NOTE  Referring Provider is [name], [place]  Primary Cardiologist is Nahser, Arnette Norris, MD CHF Cardiologist is Larey Dresser, MD PCP is Seward Carol, MD   HPI:  Patient is a 72 year old African-American female with history of chronic combined systolic and diastolic congestive heart failure, mitral regurgitation, stage III chronic kidney disease with h/o nephrectomy in the past due to renal cell CA, hypertension, pulmonary hypertension, recent stroke, and hyperlipidemia who returns to the office today to discuss treatment options for management of mitral regurgitation.  She was initially seen in consultation as an outpatient on April 27, 2017.  She was hospitalized shortly after that and again more recently in early February for acute exacerbations of chronic combined systolic and diastolic congestive heart failure.  She has now been followed by Dr. [name] in the advanced heart failure team, and PYP amyloid scan performed July 19, 2017 was suggestive of transthyretin amyloid heart disease.  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  Cardiac MRI was strongly suggestive of amyloidosis and confirmed the presence of at least moderate to severe mitral regurgitation.  Follow-up echocardiogram revealed ejection fraction estimated 40-45% with moderate to severe left ventricular hypertrophy, severe mitral regurgitation, severe enlargement of the left atrium and right atrium, and moderate tricuspid regurgitation.  She has been evaluated by Dr. Burt Knack and she returns to our office today for follow-up.  Patient reports that since her most recent hospitalization she is doing okay.  She gets short of breath with activity but she denies resting shortness of breath.  Her weight has been stable.  She denies any chest pain or chest  tightness.  She has not had palpitations, dizzy spells, nor syncope.  She has mild lower extremity edema which is stable.   Current Outpatient Medications  Medication Sig Dispense Refill  . acetaminophen (TYLENOL) 500 MG tablet Take 1,000 mg by mouth every 6 (six) hours as needed for headache (pain).    Marland Kitchen aspirin 81 MG tablet Take 1 tablet (81 mg total) daily by mouth.    . butalbital-acetaminophen-caffeine (FIORICET, ESGIC) 50-325-40 MG tablet Take 1 tablet by mouth every 12 (twelve) hours as needed for headache. 14 tablet 0  . carvedilol (COREG) 3.125 MG tablet Take 1 tablet (3.125 mg total) by mouth 2 (two) times daily. 30 tablet 3  . clopidogrel (PLAVIX) 75 MG tablet Take 1 tablet (75 mg total) by mouth daily. 30 tablet 1  . feeding supplement, ENSURE ENLIVE, (ENSURE ENLIVE) LIQD Take 237 mLs by mouth 2 (two) times daily between meals. 237 mL 0  . furosemide (LASIX) 80 MG tablet Take 1 tablet (80 mg total) by mouth 2 (two) times daily. 60 tablet 0  . levothyroxine (SYNTHROID, LEVOTHROID) 25 MCG tablet Take 25 mcg by mouth daily before breakfast.    . potassium chloride SA (K-DUR,KLOR-CON) 20 MEQ tablet Take 1 tablet (20 mEq total) by mouth 2 (two) times daily. 60 tablet 0  . pravastatin (PRAVACHOL) 80 MG tablet Take 1 tablet (80 mg total) by mouth at bedtime. 30 tablet 11   No current facility-administered medications for this visit.       Physical Exam:   BP 117/79   Pulse 82   Resp 20   Ht 5\' 3"  (1.6 m)  Wt 147 lb (66.7 kg)   LMP 02/12/2013   SpO2 98% Comment: RA  BMI 26.04 kg/m   General:  WDWN female NAD  Chest:   Clear with symmetrical breath sounds  CV:   Regular rate and rhythm with soft systolic murmur heard at the apex  Incisions:  n/a  Abdomen:  Soft nontender  Extremities:  Warm and well perfused, trace lower extremity edema  Diagnostic Tests:  Transthoracic Echocardiography  Patient:    Emmarie, Sannes MR #:       [id number] Study Date: 05/09/2017 Gender:      F Age:        29 Height:     160 cm Weight:     74.7 kg BSA:        1.85 m^2 Pt. Status: Room:       [place], [place] [id number]  [id number]     LFY, BOFBP [id number]  Alvie [place] [id number]  PERFORMING   [facility], Inpatient  ATTENDING    [place]uen, Courteney Lyn  SONOGRAPHER  Jannett Celestine, RDCS  cc:  ------------------------------------------------------------------- LV EF: 50% -   55%  ------------------------------------------------------------------- Indications:      CHF - 428.0.  ------------------------------------------------------------------- History:   PMH:  Pulmonary Hypertension.  Congestive heart failure.  Risk factors:  Hypertension. Dyslipidemia.  ------------------------------------------------------------------- Study Conclusions  - Left ventricle: The cavity size was normal. Wall thickness was   increased in a pattern of severe LVH. Systolic function was   normal. The estimated ejection fraction was in the range of 50%   to 55%. Wall motion was normal; there were no regional wall   motion abnormalities. Doppler parameters are consistent with   restrictive physiology, indicative of decreased left ventricular   diastolic compliance and/or increased left atrial pressure. - Ventricular septum: The contour showed diastolic flattening and   systolic flattening. - Mitral valve: There was severe regurgitation. - Left atrium: The atrium was severely dilated. - Right atrium: The atrium was severely dilated. - Tricuspid valve: There was moderate-severe regurgitation. - Pulmonary arteries: Systolic pressure was moderately increased.   PA peak pressure: 54 mm Hg (S). - Pericardium, extracardiac: A trivial pericardial effusion was   identified.  Impressions:  - Normal LV systolic function; restrictive filling; severe LVH;   myocardium with speckled appearance (consider amyloidosis);   severe MR; severe biatrial enlargement; moderate to  severe TR   with moderately elevated pulmonary pressure.  ------------------------------------------------------------------- Study data:  Comparison was made to the study of 02/12/2017.  Study status:  Routine.  Procedure:  The patient reported no pain pre or post test. Transthoracic echocardiography. Image quality was adequate.  Study completion:  There were no complications. Transthoracic echocardiography.  M-mode, complete 2D, spectral Doppler, and color Doppler.  Birthdate:  Patient birthdate: 10/12/1945.  Age:  Patient is 72 yr old.  Sex:  Gender: female. BMI: 29.2 kg/m^2.  Blood pressure:     114/77  Patient status: Inpatient.  Study date:  Study date: 05/09/2017. Study time: 08:12 AM.  Location:  Bedside.  -------------------------------------------------------------------  ------------------------------------------------------------------- Left ventricle:  The cavity size was normal. Wall thickness was increased in a pattern of severe LVH. Systolic function was normal. The estimated ejection fraction was in the range of 50% to 55%. Wall motion was normal; there were no regional wall motion abnormalities. Doppler parameters are consistent with restrictive physiology, indicative of decreased left ventricular diastolic compliance and/or increased left atrial pressure.  ------------------------------------------------------------------- Aortic  valve:   Trileaflet; mildly thickened leaflets. Mobility was not restricted.  Doppler:  Transvalvular velocity was within the normal range. There was no stenosis. There was no regurgitation.   ------------------------------------------------------------------- Aorta:  Aortic root: The aortic root was normal in size.  ------------------------------------------------------------------- Mitral valve:   Mildly thickened leaflets . Mobility was not restricted.  Doppler:  Transvalvular velocity was within the normal range. There was no  evidence for stenosis. There was severe regurgitation.    Peak gradient (D): 5 mm Hg.  ------------------------------------------------------------------- Left atrium:  The atrium was severely dilated.  ------------------------------------------------------------------- Right ventricle:  The cavity size was normal. Systolic function was normal.  ------------------------------------------------------------------- Ventricular septum:   The contour showed diastolic flattening and systolic flattening.  ------------------------------------------------------------------- Pulmonic valve:    Doppler:  Transvalvular velocity was within the normal range. There was no evidence for stenosis. There was trivial regurgitation.  ------------------------------------------------------------------- Tricuspid valve:   Structurally normal valve.    Doppler: Transvalvular velocity was within the normal range. There was moderate-severe regurgitation.  ------------------------------------------------------------------- Pulmonary artery:   Systolic pressure was moderately increased.  ------------------------------------------------------------------- Right atrium:  The atrium was severely dilated.  ------------------------------------------------------------------- Pericardium:  A trivial pericardial effusion was identified.  ------------------------------------------------------------------- Systemic veins: Inferior vena cava: The vessel was mildly dilated.  ------------------------------------------------------------------- Measurements   Left ventricle                         Value        Reference  LV ID, ED, PLAX chordal        (L)     41.5  mm     43 - 52  LV ID, ES, PLAX chordal                32.8  mm     23 - 38  LV fx shortening, PLAX chordal (L)     21    %      >=29  LV PW thickness, ED                    14.46 mm     ----------  IVS/LV PW ratio, ED                    1.15          <=1.3  Stroke volume, 2D                      29    ml     ----------  Stroke volume/bsa, 2D                  16    ml/m^2 ----------  LV e&', lateral                         7.18  cm/s   ----------  LV E/e&', lateral                       15.6         ----------  LV e&', medial                          5.33  cm/s   ----------  LV E/e&', medial                        21.01        ----------  LV e&', average                         6.26  cm/s   ----------  LV E/e&', average                       17.91        ----------    Ventricular septum                     Value        Reference  IVS thickness, ED                      16.56 mm     ----------    LVOT                                   Value        Reference  LVOT ID, S                             19    mm     ----------  LVOT area                              2.84  cm^2   ----------  LVOT peak velocity, S                  58.3  cm/s   ----------  LVOT mean velocity, S                  44.8  cm/s   ----------  LVOT VTI, S                            10.1  cm     ----------  LVOT peak gradient, S                  1     mm Hg  ----------    Aorta                                  Value        Reference  Aortic root ID, ED                     28    mm     ----------    Left atrium                            Value        Reference  LA ID, A-P, ES                         48    mm     ----------  LA ID/bsa, A-P                 (H)     2.6   cm/m^2 <=2.2  LA volume, S  94.8  ml     ----------  LA volume/bsa, S                       51.4  ml/m^2 ----------  LA volume, ES, 1-p A4C                 78.8  ml     ----------  LA volume/bsa, ES, 1-p A4C             42.7  ml/m^2 ----------  LA volume, ES, 1-p A2C                 106   ml     ----------  LA volume/bsa, ES, 1-p A2C             57.4  ml/m^2 ----------    Mitral valve                           Value        Reference  Mitral E-wave peak velocity             112   cm/s   ----------  Mitral A-wave peak velocity            33.9  cm/s   ----------  Mitral deceleration time               165   ms     150 - 230  Mitral peak gradient, D                5     mm Hg  ----------  Mitral E/A ratio, peak                 3.3          ----------    Pulmonary arteries                     Value        Reference  PA pressure, S, DP             (H)     54    mm Hg  <=30    Tricuspid valve                        Value        Reference  Tricuspid regurg peak velocity         312   cm/s   ----------  Tricuspid peak RV-RA gradient          39    mm Hg  ----------    Right atrium                           Value        Reference  RA ID, S-I, ES, A4C            (H)     58.9  mm     34 - 49  RA area, ES, A4C               (H)     26.1  cm^2   8.3 - 19.5  RA volume, ES, A/L                     97.9  ml     ----------  RA volume/bsa, ES, A/L                 53.1  ml/m^2 ----------    Systemic veins                         Value        Reference  Estimated CVP                          15    mm Hg  ----------    Right ventricle                        Value        Reference  TAPSE                                  16.8  mm     ----------  RV pressure, S, DP             (H)     54    mm Hg  <=30  RV s&', lateral, S                      8.92  cm/s   ----------  Legend: (L)  and  (H)  mark values outside specified reference range.  ------------------------------------------------------------------- Prepared and Electronically Authenticated by  Kirk Ruths 2018-10-08T10:48:51   NUCLEAR MEDICINE TUMOR LOCALIZATION. PYP CARDIAC AMYLOIDOSIS SCAN WITH SPECT  TECHNIQUE: Following intravenous administration of radiopharmaceutical, anterior planar images of the chest were obtained. Regions of interest were placed on the heart and contralateral chest wall for quantitative assessment. Additional SPECT imaging of the chest was obtained.  RADIOPHARMACEUTICALS:   20.2 mCi TECHNETIUM 99 PYROPHOSPHATE  FINDINGS: Planar Visual assessment:  Anterior planar imaging demonstrates radiotracer uptake within the heart greater than uptake within the adjacent ribs (Grade 3).  Quantitative assessment :  Quantitative assessment of the cardiac uptake compared to the contralateral chest wall is equal to 1.94 (H/CL = 1.94) .  SPECT assessment: SPECT imaging of the chest demonstrates clear radiotracer accumulation within the LEFT ventricle.  IMPRESSION: Visual and quantitative assessment (grade 3, H/CLL equal 1.94) are strongly suggestive of transthyretin amyloidosis.   Electronically Signed   By: Suzy Bouchard M.D.   On: 07/06/2017 15:25    CARDIAC MRI  TECHNIQUE: The patient was scanned on a 1.5 Tesla GE magnet. A dedicated cardiac coil was used. Functional imaging was done using Fiesta sequences. 2,3, and 4 chamber views were done to assess for RWMA's. Modified Simpson's rule using a short axis stack was used to calculate an ejection fraction on a dedicated work Conservation officer, nature. The patient received 20 cc of Multihance. After 10 minutes inversion recovery sequences were used to assess for infiltration and scar tissue.  CONTRAST:  20 cc Multihance contrast  FINDINGS: Limited images of the lung fields showed no gross abnormalities.  There was a small circumferential pericardial effusion.  Normal left ventricular size with moderate concentric LV hypertrophy. EF 45% with diffuse hypokinesis. Mildly dilated right ventricle with mildly decreased systolic function. Severe left atrial enlargement. Severe right atrial enlargement. Moderate tricuspid regurgitation. The aortic valve was trileaflet with no significant regurgitation or stenosis. Visually, mitral regurgitation appeared severe. However, regurgitant fraction 41% suggests moderate mitral regurgitation.  On delayed enhancement imaging, there  was extensive  late gadolinium enhancement (LGE) involving the free walls of both atria and the interatrial septum, the right ventricle, and extensive subendocardial LGE throughout the left ventricle.  Measurements:  LVEDV 154 mL  LVSV 69 mL  LVEF 45%  Aortic forward volume 41 mL  MR regurgitant fraction 41%  IMPRESSION: 1. Normal LV size with moderate LVH, EF 45% with diffuse hypokinesis.  2.  Mildly dilated RV with mildly decreased systolic function.  3. Moderate to severe mitral regurgitation (moderate by calculated regurgitant fraction, severe visually).  4.  Severe biatrial enlargement.  5.  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  Dalton Mclean   Electronically Signed   By: Loralie Champagne M.D.   On: 09/09/2017 17:38   Transthoracic Echocardiography  Patient:    Gazelle, Towe MR #:       [id number] Study Date: 09/08/2017 Gender:     F Age:        1 Height:     160 cm Weight:     68.5 kg BSA:        1.76 m^2 Pt. Status: Room:       [place], MD [place], [place] [id number]  SONOGRAPHER  Jannett Celestine, RDCS  ORDERING     [place], [place]    Lillia [place] M  cc:  ------------------------------------------------------------------- LV EF: 40% -   45%  ------------------------------------------------------------------- History:   PMH:  CHF 428.31.  Congestive heart failure.  Stroke. PMH:  Pulmonary HTN.  Risk factors:  Hypertension. Dyslipidemia.   ------------------------------------------------------------------- Study Conclusions  - Left ventricle: The cavity size was normal. Wall thickness was   increased in a pattern of moderate to severe LVH. Systolic   function was mildly to moderately reduced. The estimated ejection   fraction was in the range of 40% to 45%. Wall motion was normal;   there were  no regional wall motion abnormalities. - Mitral valve: There was severe regurgitation directed   posteriorly. - Left atrium: The atrium was severely dilated. - Right atrium: The atrium was severely dilated. - Tricuspid valve: There was moderate regurgitation. - Pulmonary arteries: Systolic pressure was moderately increased.   PA peak pressure: 49 mm Hg (S).  ------------------------------------------------------------------- Study data:  Comparison was made to the study of 05/09/2017.  Study status:  Routine.  Procedure:  The patient reported no pain pre or post test. Transthoracic echocardiography. Image quality was adequate.  Study completion:  There were no complications. Transthoracic echocardiography.  M-mode, complete 2D, spectral Doppler, and color Doppler.  Birthdate:  Patient birthdate: 1946-01-30.  Age:  Patient is 73 yr old.  Sex:  Gender: female. BMI: 26.8 kg/m^2.  Blood pressure:     103/80  Patient status: Inpatient.  Study date:  Study date: 09/08/2017. Study time: 01:01 PM.  Location:  Emergency department.  -------------------------------------------------------------------  ------------------------------------------------------------------- Left ventricle:  The cavity size was normal. Wall thickness was increased in a pattern of moderate to severe LVH. Systolic function was mildly to moderately reduced. The estimated ejection fraction was in the range of 40% to 45%. Wall motion was normal; there were no regional wall motion abnormalities.  ------------------------------------------------------------------- Aortic valve:   Trileaflet; normal thickness leaflets. Mobility was not restricted.  Doppler:  Transvalvular velocity was within the normal range. There was no stenosis. There was no regurgitation.   ------------------------------------------------------------------- Aorta:  Aortic  root: The aortic root was normal in  size.  ------------------------------------------------------------------- Mitral valve:   Mildly thickened leaflets . Mobility was not restricted.  Doppler:  Transvalvular velocity was within the normal range. There was no evidence for stenosis. There was severe regurgitation directed posteriorly.    Peak gradient (D): 2 mm Hg.   ------------------------------------------------------------------- Left atrium:  The atrium was severely dilated.  ------------------------------------------------------------------- Right ventricle:  The cavity size was normal. Wall thickness was normal. Systolic function was normal.  ------------------------------------------------------------------- Pulmonic valve:    The valve appears to be grossly normal. Doppler:  Transvalvular velocity was within the normal range. There was no evidence for stenosis. There was mild regurgitation.  ------------------------------------------------------------------- Tricuspid valve:   Structurally normal valve.    Doppler: Transvalvular velocity was within the normal range. There was moderate regurgitation.  ------------------------------------------------------------------- Pulmonary artery:   The main pulmonary artery was normal-sized. Systolic pressure was moderately increased.  ------------------------------------------------------------------- Right atrium:  The atrium was severely dilated.  ------------------------------------------------------------------- Pericardium:  There was no pericardial effusion.  ------------------------------------------------------------------- Systemic veins: Inferior vena cava: The vessel was severely dilated. Respirophasic changes in dimension were absent.  ------------------------------------------------------------------- Measurements   Left ventricle                             Value        Reference  LV ID, ED, PLAX chordal                    49    mm      43 - 52  LV ID, ES, PLAX chordal                    38    mm     23 - 38  LV fx shortening, PLAX chordal     (L)     22    %      >=29  LV PW thickness, ED                        15    mm     ----------  IVS/LV PW ratio, ED                        1.2          <=1.3  Stroke volume, 2D                          39    ml     ----------  Stroke volume/bsa, 2D                      22    ml/m^2 ----------    Ventricular septum                         Value        Reference  IVS thickness, ED                          18    mm     ----------    LVOT  Value        Reference  LVOT ID, S                                 19    mm     ----------  LVOT area                                  2.84  cm^2   ----------  LVOT peak velocity, S                      65.8  cm/s   ----------  LVOT mean velocity, S                      48.7  cm/s   ----------  LVOT VTI, S                                13.7  cm     ----------  LVOT peak gradient, S                      2     mm Hg  ----------    Aorta                                      Value        Reference  Aortic root ID, ED                         28    mm     ----------    Left atrium                                Value        Reference  LA ID, A-P, ES                             50    mm     ----------  LA ID/bsa, A-P                     (H)     2.84  cm/m^2 <=2.2  LA volume, S                               94.2  ml     ----------  LA volume/bsa, S                           53.5  ml/m^2 ----------  LA volume, ES, 1-p A4C                     97    ml     ----------  LA volume/bsa, ES, 1-p A4C                 55.1  ml/m^2 ----------  LA volume, ES, 1-p A2C  85    ml     ----------  LA volume/bsa, ES, 1-p A2C                 48.3  ml/m^2 ----------    Mitral valve                               Value        Reference  Mitral E-wave peak velocity                75.7  cm/s   ----------  Mitral  deceleration time           (H)     292   ms     150 - 230  Mitral peak gradient, D                    2     mm Hg  ----------  Mitral maximal regurg velocity,            506   cm/s   ----------  PISA  Mitral regurg VTI, PISA                    160   cm     ----------  Mitral ERO, PISA                           0.29  cm^2   ----------  Mitral regurg volume, PISA                 46    ml     ----------    Pulmonary arteries                         Value        Reference  PA pressure, S, DP                 (H)     49    mm Hg  <=30    Tricuspid valve                            Value        Reference  Tricuspid regurg peak velocity             292   cm/s   ----------  Tricuspid peak RV-RA gradient              34    mm Hg  ----------    Right atrium                               Value        Reference  RA ID, S-I, ES, A4C                (H)     60.6  mm     34 - 49  RA area, ES, A4C                   (H)     24.8  cm^2   8.3 - 19.5  RA volume, ES, A/L  82.1  ml     ----------  RA volume/bsa, ES, A/L                     46.6  ml/m^2 ----------    Systemic veins                             Value        Reference  Estimated CVP                              15    mm Hg  ----------    Right ventricle                            Value        Reference  RV ID, minor axis, ED, A4C base            37    mm     ----------  RV ID, minor axis, ED, A4C mid             25    mm     ----------  RV ID, major axis, ED, A4C                 56    mm     55 - 91  TAPSE                                      16    mm     ----------  RV pressure, S, DP                 (H)     49    mm Hg  <=30  RV s&', lateral, S                          11.4  cm/s   ----------  Legend: (L)  and  (H)  mark values outside specified reference range.  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  I have personally reviewed the patient's follow-up echocardiograms and cardiac MRI which were performed since her office consultation visit with me last fall.  Clearly the driving force is related to her underlying amyloid heart disease.  I suspect that her mitral regurgitation is mixed etiology with a component of restricted leaflet mobility further exacerbated by the patient's underlying left ventricular systolic dysfunction.  Because of the underlying amyloidosis, I would be reluctant to consider this patient a candidate for conventional surgery.  Review of the patient's TEE suggests that her functional anatomy might be amenable to transcatheter edge to edge mitral valve repair using Mitraclip.  Plan:  The patient will continue to follow-up with Dr. [name] and Dr. Burt Knack and return to our office in the future should specific questions or problems arise.  We have not recommended any changes to her current medications.  I spent in excess of 15 minutes during the conduct of this office consultation and >50% of this time involved direct face-to-face encounter with the patient for counseling and/or coordination of their care.  Valentina Gu. [name], MD 10/03/2017 10:09 AM

## 2017-10-04 ENCOUNTER — Encounter (HOSPITAL_COMMUNITY): Payer: Self-pay

## 2017-10-04 ENCOUNTER — Ambulatory Visit (HOSPITAL_COMMUNITY)
Admission: RE | Admit: 2017-10-04 | Discharge: 2017-10-04 | Disposition: A | Discharge status: Home / Self Care | Payer: Medicare Other | Source: Ambulatory Visit | Attending: Cardiology | Admitting: Cardiology

## 2017-10-04 VITALS — BP 148/76 | HR 83 | Wt 145.8 lb

## 2017-10-04 DIAGNOSIS — E785 Hyperlipidemia, unspecified: Secondary | ICD-10-CM | POA: Insufficient documentation

## 2017-10-04 DIAGNOSIS — I130 Hypertensive heart and chronic kidney disease with heart failure and stage 1 through stage 4 chronic kidney disease, or unspecified chronic kidney disease: Secondary | ICD-10-CM | POA: Insufficient documentation

## 2017-10-04 DIAGNOSIS — G629 Polyneuropathy, unspecified: Secondary | ICD-10-CM | POA: Insufficient documentation

## 2017-10-04 DIAGNOSIS — N183 Chronic kidney disease, stage 3 unspecified: Secondary | ICD-10-CM

## 2017-10-04 DIAGNOSIS — Z79899 Other long term (current) drug therapy: Secondary | ICD-10-CM | POA: Insufficient documentation

## 2017-10-04 DIAGNOSIS — Z800 Family history of malignant neoplasm of digestive organs: Secondary | ICD-10-CM | POA: Insufficient documentation

## 2017-10-04 DIAGNOSIS — Z85528 Personal history of other malignant neoplasm of kidney: Secondary | ICD-10-CM | POA: Insufficient documentation

## 2017-10-04 DIAGNOSIS — Z7902 Long term (current) use of antithrombotics/antiplatelets: Secondary | ICD-10-CM | POA: Insufficient documentation

## 2017-10-04 DIAGNOSIS — E039 Hypothyroidism, unspecified: Secondary | ICD-10-CM | POA: Insufficient documentation

## 2017-10-04 DIAGNOSIS — Z905 Acquired absence of kidney: Secondary | ICD-10-CM | POA: Insufficient documentation

## 2017-10-04 DIAGNOSIS — I43 Cardiomyopathy in diseases classified elsewhere: Secondary | ICD-10-CM

## 2017-10-04 DIAGNOSIS — E854 Organ-limited amyloidosis: Secondary | ICD-10-CM | POA: Diagnosis not present

## 2017-10-04 DIAGNOSIS — I5023 Acute on chronic systolic (congestive) heart failure: Secondary | ICD-10-CM

## 2017-10-04 DIAGNOSIS — Z833 Family history of diabetes mellitus: Secondary | ICD-10-CM | POA: Insufficient documentation

## 2017-10-04 DIAGNOSIS — I340 Nonrheumatic mitral (valve) insufficiency: Secondary | ICD-10-CM

## 2017-10-04 DIAGNOSIS — Z8673 Personal history of transient ischemic attack (TIA), and cerebral infarction without residual deficits: Secondary | ICD-10-CM | POA: Diagnosis not present

## 2017-10-04 DIAGNOSIS — Z7982 Long term (current) use of aspirin: Secondary | ICD-10-CM | POA: Insufficient documentation

## 2017-10-04 DIAGNOSIS — Z79890 Hormone replacement therapy (postmenopausal): Secondary | ICD-10-CM | POA: Insufficient documentation

## 2017-10-04 DIAGNOSIS — I5032 Chronic diastolic (congestive) heart failure: Secondary | ICD-10-CM

## 2017-10-04 DIAGNOSIS — Z823 Family history of stroke: Secondary | ICD-10-CM | POA: Diagnosis not present

## 2017-10-04 LAB — BASIC METABOLIC PANEL
Anion gap: 8 (ref 5–15)
BUN: 32 mg/dL — ABNORMAL HIGH (ref 6–20)
CO2: 30 mmol/L (ref 22–32)
Calcium: 9.8 mg/dL (ref 8.9–10.3)
Chloride: 104 mmol/L (ref 101–111)
Creatinine, Ser: 1.29 mg/dL — ABNORMAL HIGH (ref 0.44–1.00)
GFR calc Af Amer: 47 mL/min — ABNORMAL LOW (ref >60)
GFR calc non Af Amer: 40 mL/min — ABNORMAL LOW (ref >60)
Glucose, Bld: 87 mg/dL (ref 65–99)
Potassium: 3.9 mmol/L (ref 3.5–5.1)
Sodium: 142 mmol/L (ref 135–145)

## 2017-10-04 MED ORDER — CARVEDILOL 6.25 MG PO TABS: 6.2500 mg | ORAL_TABLET | Freq: Two times a day (BID) | ORAL | 11 refills | Status: DC

## 2017-10-04 NOTE — Patient Instructions (Signed)
INCREASE [name] (Coreg) to 6.25 mg twice daily. Can "double up" on current 3.125 mg tablets (Take 2 tabs twice daily). New Rx has been sent to your pharmacy for 6.25 mg tablets (Take 1 tab twice daily).  Routine lab work today. Will notify you of abnormal results, otherwise no news is good news!  Follow up 2 months with Dr. [name].  ______________________________________________________ Katie [name] Code: 1200  Take all medication as prescribed the day of your appointment. Bring all medications with you to your appointment.  Do the following things EVERYDAY: 1) Weigh yourself in the morning before breakfast. Write it down and keep it in a log. 2) Take your medicines as prescribed 3) Eat low salt foods-Limit salt (sodium) to 2000 mg per day.  4) Stay as active as you can everyday 5) Limit all fluids for the day to less than 2 liters

## 2017-10-04 NOTE — Progress Notes (Signed)
PCP: Dr. 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 female with history of chronic diastolic CHF, mitral regurgitation, CVA, and CKD returns for followup of CHF.   Patient was initially admitted with diastolic CHF in 6/01. Echo showed EF 60-65% with grade 3 diastolic dysfunction and moderate MR.  She had cath showing no coronary disease. 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  TEE this admission showed moderate-severe MR.  She was admitted again in 10/18 with acute on chronic diastolic CHF. Echo this admission showed EF 50-55%, severe LVH, restrictive diastolic dysfunction, severe MR, moderate-severe TR.  She was referred to Dr. [name] who saw her recently for mitral valve evaluation.  Upon review of studies, regurgitation thought to be more in the moderate range and likely functional.   Patient had PYP amyloid scan in 12/18, this was suggestive of transthyretin amyloidosis.  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   Recent admission for A/C diastolic HF 0/9-3/23/5573. Required IV diuresis, then transitioned to 80 mg PO lasix BID. Metop was DC'd due to low blood pressures. Cardiac MRI strongly suggestive of amyloidosis, moderate to severe MR (moderate by regurgitant fraction 41% but severe visually). Echo repeated: EF 40-45%, moderate to severe LVH, severe MR, severe dilation of LA and RA, moderate TR, PASP 49.  Seen by Dr. Burt Knack and thought to be OK candidate for MitraClip. Seen by Dr [name] and thought to be poor candidate for traditional MVR with cardiac amyloid.   She returns today for regular follow up.  Overall feeling OK. Weight down 1 lb from last visit.  She remains SOB with minimal activity, including walking around her house, and intermittently with her ADLs. She denies chest pains or palpitations. She has occasional lightheadedness but not marked or limiting. She continues to have intermittent LUE numbness and  tingling. No orthopnea or PND. Taking all medications as prescribe. Watching her salt and fluid intake.  BP has been 120-130s at home.   ECG (10/18): NSR, low voltage in limb leads, nonspecific T wave flattening.   Labs (10/18): K 4.4, creatinine 1.27, LDL 56, HDL 39 Labs (11/18): K 3.9, creatinine 1.43, SPEP negative Labs (2/19): K 3.8, creatinine 1.40, hgb 13.4  PMH: 1. HTN 2. Hyperlipidemia 3. 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  LINQ monitor placed.  4. Chronic diastolic CHF:  - LHC (2/20): Normal coronaries.  - Echo (10/18): EF 50-55%, severe LVH with speckled myocardium concerning for amyloidosis, restrictive diastolic function with septal flattening, severe MR, moderate-severe TR, severe biatrial enlargement, PASP 54 mmHg.  - PYP amyloid scan: Grade 3, suggestive of transthyretin amyloidosis.  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  5. Mitral regurgitation: Think mixed functional/degenerative - TEE (7/18): EF 45-50%, moderate-severe MR, mild-moderate TR.  - Echo (10/18) read as severe MR.  - 2/19 echo with severe MR.  - 2/19 cardiac MRI with moderate to severe MR.  6. CKD stage 3 7. Hypothyroidism 8. H/o renal cell CA s/p nephrectomy.   Social History   Socioeconomic History  . Marital status: Divorced  Spouse name: Not on file  . Number of children: Not on file  . Years of education: Not on file  . Highest education level: Not on file  Social Needs  . Financial resource strain: Not on file  . Food insecurity - worry: Not on file  . Food insecurity -  inability: Not on file  . Transportation needs - medical: Not on file  . Transportation needs - non-medical: Not on file  Occupational History  . Not on file  Tobacco Use  . Smoking status: Never Smoker  . Smokeless tobacco: Never Used  Substance and Sexual Activity  . Alcohol use: No  . Drug use: No  . Sexual activity: No  Other Topics Concern  . Not on file  Social History Narrative  . Not on file   Family History  Problem Relation Age of Onset  . Stroke Brother   . Diabetes Mellitus II Sister   . Colon cancer Sister    Review of systems complete and found to be negative unless listed in HPI.    Current Outpatient Medications  Medication Sig Dispense Refill  . acetaminophen (TYLENOL) 500 MG tablet Take 1,000 mg by mouth every 6 (six) hours as needed for headache (pain).    Marland Kitchen aspirin 81 MG tablet Take 1 tablet (81 mg total) daily by mouth.    . butalbital-acetaminophen-caffeine (FIORICET, ESGIC) 50-325-40 MG tablet Take 1 tablet by mouth every 12 (twelve) hours as needed for headache. 14 tablet 0  . carvedilol (COREG) 3.125 MG tablet Take 1 tablet (3.125 mg total) by mouth 2 (two) times daily. 30 tablet 3  . clopidogrel (PLAVIX) 75 MG tablet Take 1 tablet (75 mg total) by mouth daily. 30 tablet 1  . feeding supplement, ENSURE ENLIVE, (ENSURE ENLIVE) LIQD Take 237 mLs by mouth 2 (two) times daily between meals. 237 mL 0  . furosemide (LASIX) 80 MG tablet Take 1 tablet (80 mg total) by mouth 2 (two) times daily. 60 tablet 0  . levothyroxine (SYNTHROID, LEVOTHROID) 25 MCG tablet Take 25 mcg by mouth daily before breakfast.    . potassium chloride SA (K-DUR,KLOR-CON) 20 MEQ tablet Take 1 tablet (20 mEq total) by mouth 2 (two) times daily. 60 tablet 0  . pravastatin (PRAVACHOL) 80 MG tablet Take 1 tablet (80 mg total) by mouth at bedtime. 30 tablet 11   No current facility-administered medications for this encounter.    Vitals:   10/04/17 0916  BP: (!) 148/76  Pulse: 83  SpO2:  99%  Weight: 145 lb 12.8 oz (66.1 kg)   Wt Readings from Last 3 Encounters:  10/04/17 145 lb 12.8 oz (66.1 kg)  10/03/17 147 lb (66.7 kg)  09/28/17 147 lb 6.4 oz (66.9 kg)    Physical Exam General: Elderly and fatigued appearing. No resp difficulty. HEENT: Normal Neck: Supple. JVP 5-6. Carotids 2+ bilat; no bruits. No thyromegaly or nodule noted. Cor: PMI nondisplaced. RRR, 1/6 HSM at apex. Lungs: CTAB, normal effort. Abdomen: Soft, non-tender, non-distended, no HSM. No bruits or masses. +BS  Extremities: No cyanosis, clubbing, or rash. R and LLE no edema.  Neuro: Alert & orientedx3, cranial nerves grossly intact. moves all 4 extremities w/o difficulty. Affect pleasant   Assessment/Plan: 1. Chronic diastolic CHF: Normal EF on 10/18 echo with severe LVH and speckled myocardium consistent with cardiac amyloidosis.  SPEP was negative. 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 Cardiac MRI also strongly suggestive of  cardiac amyloidosis.  - NYHA III symptoms.  - Volume status stable on exam.  - Continue Lasix 80 mg BID. BMET today.  - Increase coreg 6.25 mg BID. She was mistakenly taking this dose and felt OK. So will try to increase.  - Insurance denied coverage for Cardiomems device. Will forward to RN to see if appeal a possibility. Though I question if her prognosis may preclude her from consideration.  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  2. Mitral regurgitation: Mitral regurgitation seems to fluctuate somewhat based on loading conditions.  Most recently, echo in 2/19 suggested severe MR and cardiac MRI suggested moderate to severe MR (moderate by 41% regurgitant fraction but visually severe). Suspect that the etiology is mixed - functional as well as degenerative with thickening and restriction of posterior leaflet. When she is volume  overloaded, her murmur is more prominent.   - MR murmur is not prominent on exam.  - Has seen Dr. Burt Knack for Mitraclip consideration. Sees again in April.   - Dr [name] determined patient to be poor candidate for traditional MVR.  3. CKD: Stage III - Follows with nephrology. BMET today.  4. CVA: 7/18. Has LINQ monitor, no atrial fibrillation detected so far.  - Continue ASA 81.   - Continue Plavix. - Continue statin, lipids ok 09/2017 - No change.  5. 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  PND score = 1.  - Refer to neurologist for formal evaluation of neuropathy. Awaiting availability. Will recheck this week.  6. Cardiac amyloidosis: Genetic TTR amyloidosis.  We are trying to arrange for patisiran treatment.   - Plan for neurology evaluation as above.  - Has been referred to Dr Broadus John for genetic counseling, family can be tested for her amyloidosis gene mutation.   She sees Dr. Burt Knack in 6 weeks. Will see Dr. [name] in 2 months. Sooner with symptoms. Meds, labs, and referrals as above.   Shirley Friar, PA-C  10/04/2017 9:24 AM

## 2017-10-05 ENCOUNTER — Telehealth (HOSPITAL_COMMUNITY): Payer: Self-pay

## 2017-10-05 ENCOUNTER — Encounter (HOSPITAL_COMMUNITY): Payer: Self-pay

## 2017-10-05 ENCOUNTER — Ambulatory Visit
Admission: RE | Admit: 2017-10-05 | Discharge: 2017-10-05 | Disposition: A | Discharge status: Home / Self Care | Payer: Medicare Other | Source: Ambulatory Visit | Attending: Internal Medicine | Admitting: Internal Medicine

## 2017-10-05 ENCOUNTER — Other Ambulatory Visit (HOSPITAL_COMMUNITY): Payer: Self-pay | Admitting: Interventional Radiology

## 2017-10-05 DIAGNOSIS — I639 Cerebral infarction, unspecified: Secondary | ICD-10-CM

## 2017-10-05 DIAGNOSIS — Z1231 Encounter for screening mammogram for malignant neoplasm of breast: Secondary | ICD-10-CM

## 2017-10-05 NOTE — Progress Notes (Signed)
Post-test Genetic Consult   Referral Reason  [name] was referred for a post-test genetic consult of her genetic testing for TTR. 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  Genetic Consultation Notes  Katie [name], a pleasant 72 year old woman is here today with her son, Nutritional therapist. They were counseled on the genetics of hereditary transthyretin amyloidosis (HTA), namely inheritance, incomplete penetrance and variable expression that is seen in patients with cardiac amyloidosis due to mutations in TTR gene. I informed her that most patients with TTR cardiac amyloidosis develop cardiac symptoms after the age of 62. She states that she was a very active woman until her late 52s and currently is unable to perform tasks that she routinely perfromed.  I explained to her that the V142I TTR pathogenic variant has been reported in several patients with cardiac amyloidosis and is more prevalent amongst African Americans. About 3% to 3.9% of African Americans are heterozygous for V142I pathogenic variant.   I explained to them that mutations in TTR gene destabilizes the protein causing it to misfold and accumulate in the cardiac tissue. Treatment options are available to stabilize the TTR tetramer.  Since this is an autosomal dominant condition her children are at a 50% risk of inheriting her condition. Katie [name] is not sure if her other children will pursue genetic testing. Her son, Katie [name] is interested in getting tested for this variant. I informed him that it is recommended to clarify his risk for cardiac amyloidosis so as to reduce morbidity and mortality by early diagnosis and treatment. I explained to them that there is a 50% chance of having a positive outcome. This will help direct appropriate surveillance, treatment and subsequent management of cardiac amyloidosis.   Katie [name]'s 4-generation family history was obtained. There is no overt disease in her family. She tells me that her parents  died of old age and does not know much about their siblings or her grandparents.  Plans Katie [name]'s son is interested in genetic testing for the familial pathogenic variant in TTR gene. Blood was drawn today and sent for testing  Katie [name], Ph.D, [facility] Clinical Molecular Geneticist

## 2017-10-05 NOTE — Telephone Encounter (Signed)
Called to schedule f/u mra, no answer, left vm. AW 

## 2017-10-06 ENCOUNTER — Other Ambulatory Visit: Payer: Self-pay | Admitting: Internal Medicine

## 2017-10-06 DIAGNOSIS — R928 Other abnormal and inconclusive findings on diagnostic imaging of breast: Secondary | ICD-10-CM

## 2017-10-12 ENCOUNTER — Ambulatory Visit
Admission: RE | Admit: 2017-10-12 | Discharge: 2017-10-12 | Disposition: A | Discharge status: Home / Self Care | Payer: Medicare Other | Source: Ambulatory Visit | Attending: Internal Medicine | Admitting: Internal Medicine

## 2017-10-12 DIAGNOSIS — R928 Other abnormal and inconclusive findings on diagnostic imaging of breast: Secondary | ICD-10-CM

## 2017-10-14 ENCOUNTER — Ambulatory Visit (HOSPITAL_COMMUNITY)
Admission: RE | Admit: 2017-10-14 | Discharge: 2017-10-14 | Disposition: A | Discharge status: Home / Self Care | Payer: Medicare Other | Source: Ambulatory Visit | Attending: Interventional Radiology | Admitting: Interventional Radiology

## 2017-10-14 DIAGNOSIS — I639 Cerebral infarction, unspecified: Secondary | ICD-10-CM | POA: Insufficient documentation

## 2017-10-14 LAB — CUP PACEART REMOTE DEVICE CHECK
Date Time Interrogation Session: 20190215231017
Implantable Pulse Generator Implant Date: 20180720

## 2017-10-19 ENCOUNTER — Ambulatory Visit (INDEPENDENT_AMBULATORY_CARE_PROVIDER_SITE_OTHER): Payer: Medicare Other | Admitting: *Deleted

## 2017-10-19 ENCOUNTER — Telehealth (HOSPITAL_COMMUNITY): Payer: Self-pay

## 2017-10-19 DIAGNOSIS — I639 Cerebral infarction, unspecified: Secondary | ICD-10-CM

## 2017-10-19 NOTE — Telephone Encounter (Signed)
Pt agreed to f/u in 1 year with mra wo per Dr. Estanislado Pandy. AW

## 2017-10-20 NOTE — Progress Notes (Signed)
Carelink Summary Report / Loop Recorder 

## 2017-10-26 ENCOUNTER — Other Ambulatory Visit: Payer: Self-pay

## 2017-10-26 NOTE — Patient Outreach (Signed)
[place]) Care Management  [place]  10/26/2017   Katie [name] 03-Jan-1946 [id number]  Subjective: Successful telephone call placed to the patient for monthly assessment.  HIPAA verified.  The patient states that she is doing well.  She denies and pain or falls.  She states that she is adherent with her medications.  She states that she has been monitoring her edema and does not have any.  She has not had any problems with breathing.  She is watching her salt intake in her diet.  The patient has an appointment with her cardiologist 12/06/2017.    Encounter Medications:  Outpatient Encounter Medications as of 10/26/2017  Medication Sig  . acetaminophen (TYLENOL) 500 MG tablet Take 1,000 mg by mouth every 6 (six) hours as needed for headache (pain).  Marland Kitchen aspirin 81 MG tablet Take 1 tablet (81 mg total) daily by mouth.  . butalbital-acetaminophen-caffeine (FIORICET, ESGIC) 50-325-40 MG tablet Take 1 tablet by mouth every 12 (twelve) hours as needed for headache.  . carvedilol (COREG) 6.25 MG tablet Take 1 tablet (6.25 mg total) by mouth 2 (two) times daily.  . clopidogrel (PLAVIX) 75 MG tablet Take 1 tablet (75 mg total) by mouth daily.  . feeding supplement, ENSURE ENLIVE, (ENSURE ENLIVE) LIQD Take 237 mLs by mouth 2 (two) times daily between meals.  . furosemide (LASIX) 80 MG tablet Take 1 tablet (80 mg total) by mouth 2 (two) times daily.  Marland Kitchen levothyroxine (SYNTHROID, LEVOTHROID) 25 MCG tablet Take 25 mcg by mouth daily before breakfast.  . potassium chloride SA (K-DUR,KLOR-CON) 20 MEQ tablet Take 1 tablet (20 mEq total) by mouth 2 (two) times daily.  . pravastatin (PRAVACHOL) 80 MG tablet Take 1 tablet (80 mg total) by mouth at bedtime.   No facility-administered encounter medications on file as of 10/26/2017.     Functional Status:  In your present state of health, do you have any difficulty performing the following activities: 09/08/2017 05/08/2017  Hearing? N N   Vision? Y N  Comment "left eye gets blurry sometimes" -  Difficulty concentrating or making decisions? N N  Walking or climbing stairs? Y N  Comment "I don't do stairs; I get short of breath walking" -  Dressing or bathing? N N  Doing errands, shopping? N N  Preparing Food and eating ? - -  Using the Toilet? - -  In the past six months, have you accidently leaked urine? - -  Do you have problems with loss of bowel control? - -  Managing your Medications? - -  Managing your Finances? - -  Housekeeping or managing your Housekeeping? - -  Some recent data might be hidden    Fall/Depression Screening: Fall Risk  10/26/2017 09/28/2017 08/30/2017  Falls in the past year? No Yes No  Number falls in past yr: - 1 -  Injury with Fall? - - -  Risk for fall due to : - - -  Follow up - Falls prevention discussed -   PHQ 2/9 Scores 07/28/2017 03/03/2017 02/22/2017  PHQ - 2 Score 0 0 0    Assessment: Patient continues to benefit from health coach outreach for disease management and support.  THN CM Care Plan Problem One     Most Recent Value  THN Long Term Goal   In the next 90 days the patient will not be admitted to the hospital  [place] Term Goal Start Date  10/26/17  Interventions for Problem One Long Term  Goal  [facility] talked with the patient about her health condition  THN CM Short Term Goal #1   In the next 30 days the patient will verbalize monitoring edema in legs and feet  THN CM Short Term Goal #1 Start Date  10/26/17  [facility] CM Short Term Goal #1 Met Date  10/26/17  THN CM Short Term Goal #2   In the next 30 days the patient will verbalize compliance with her medications  THN CM Short Term Goal #2 Start Date  10/26/17  [facility] CM Short Term Goal #2 Met Date  10/26/17  Interventions for Short Term Goal #2  [facility] talked with the patient and she is adherent with her medications     Plan: RN Health Coach will contact patient in the month of April and patient agrees to next outreach.    Lazaro Arms RN, BSN, [place] Direct Dial:  [phone]  Fax: [phone]

## 2017-11-04 ENCOUNTER — Other Ambulatory Visit (HOSPITAL_COMMUNITY): Payer: Self-pay | Admitting: *Deleted

## 2017-11-04 MED ORDER — CARVEDILOL 6.25 MG PO TABS: 6.2500 mg | ORAL_TABLET | Freq: Two times a day (BID) | ORAL | 3 refills | Status: DC

## 2017-11-14 ENCOUNTER — Encounter: Payer: Self-pay | Admitting: Cardiovascular Disease

## 2017-11-14 ENCOUNTER — Ambulatory Visit (INDEPENDENT_AMBULATORY_CARE_PROVIDER_SITE_OTHER): Payer: Medicare Other | Admitting: Cardiovascular Disease

## 2017-11-14 VITALS — BP 100/62 | HR 70 | Ht 63.0 in | Wt 146.0 lb

## 2017-11-14 DIAGNOSIS — I340 Nonrheumatic mitral (valve) insufficiency: Secondary | ICD-10-CM

## 2017-11-14 NOTE — Patient Instructions (Signed)
Medication Instructions:  Your provider recommends that you continue on your current medications as directed. Please refer to the Current Medication list given to you today.    Labwork: None  Testing/Procedures: You have an echocardiogram scheduled TOMORROW MORNING (Tuesday, April 16) at 8:30AM. You must arrive by 8:00AM for this test.  Follow-Up: Your procedure will either be Wednesday, May 1 OR Wednesday, May 8. We will call you to confirm your date ASAP.  Any Other Special Instructions Will Be Listed Below (If Applicable).     If you need a refill on your cardiac medications before your next appointment, please call your pharmacy.

## 2017-11-14 NOTE — Progress Notes (Addendum)
Cardiology Office Note Date:  11/15/2017   ID:  Katie [name], DOB May 18, 1946, MRN [id number]  PCP:  Seward Carol, MD  Cardiologist:  Sherren Mocha, MD    Chief Complaint  Patient presents with  . Shortness of Breath     History of Present Illness: Katie [name] is a 72 y.o. female who presents for follow-up of severe mitral regurgitation.  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 She was initially seen September 28, 2017 for consideration of MitraClip for treatment of severe, symptomatic mitral regurgitation.   She is here with her son today. Complaining of a feeling of 'palpitations' in the upper chest, but describes this as a pressure-like sensation in the upper chest and neck. This is not associated with physical exertion. Symptoms last 10-15 minutes then resolve on their own. Denies edema, orthopnea, or PND. She continues to have shortness of breath with modest activity such as walking on level ground.   Past Medical History:  Diagnosis Date  . Amyloid heart disease ([place])   . Chronic combined systolic and diastolic CHF (congestive heart failure) ([place])   . CKD (chronic kidney disease), stage III ([place])    removal of right kidney due to carcinoma  . Headache   . History of kidney cancer    a. s/p right nephrectomy ~2007.  Marland Kitchen History of loop recorder   . Hypercholesterolemia   . Hypertension   . Mitral regurgitation   . Pulmonary hypertension ([place])   . Stroke [facility])     Past Surgical History:  Procedure Laterality Date  . BREAST EXCISIONAL BIOPSY Left   . BREAST EXCISIONAL BIOPSY Right   . CARDIAC CATHETERIZATION N/A 01/12/2016   Procedure: Right/Left Heart Cath and Coronary/Graft Angiography;  Surgeon: Belva Crome, MD;  Location: [facility];  Service: Cardiovascular;  Laterality: N/A;  . CERVICAL POLYPECTOMY N/A 04/23/2013   Procedure: CERVICAL POLYPECTOMY;  Surgeon: Maeola Sarah. Landry Mellow, MD;  Location: [facility];  Service: Gynecology;  Laterality: N/A;  Possible Polypectomy  . HYSTEROSCOPY W/D&C N/A 04/23/2013   Procedure: DILATATION AND CURETTAGE /HYSTEROSCOPY;  Surgeon: Maeola Sarah. Landry Mellow, MD;  Location: [facility];  Service: Gynecology;  Laterality: N/A;  . IR ANGIO INTRA EXTRACRAN SEL COM CAROTID INNOMINATE BILAT MOD SED  02/11/2017  . IR ANGIO VERTEBRAL SEL VERTEBRAL BILAT MOD SED  02/11/2017  . KNEE ARTHROSCOPY Left   . LOOP RECORDER INSERTION N/A 02/18/2017   Procedure: Loop Recorder Insertion;  Surgeon: Constance Haw, MD;  Location: [facility];  Service: Cardiovascular;  Laterality: N/A;  . NEPHRECTOMY Right    due to carcinoma  . TEE WITHOUT CARDIOVERSION N/A 02/18/2017   Procedure: TRANSESOPHAGEAL ECHOCARDIOGRAM (TEE) WITH LOOP;  Surgeon: Acie Fredrickson Wonda Cheng, MD;  Location: [facility];  Service: Cardiovascular;  Laterality: N/A;    Current Outpatient Medications  Medication Sig Dispense Refill  . acetaminophen (TYLENOL) 500 MG tablet Take 1,000 mg by mouth every 6 (six) hours as needed for headache (pain).    Marland Kitchen aspirin 81 MG tablet Take 1 tablet (81 mg total) daily by mouth.    . butalbital-acetaminophen-caffeine (FIORICET, ESGIC) 50-325-40 MG tablet Take 1 tablet by mouth every 12 (twelve) hours as needed for headache. 14 tablet 0  . carvedilol (COREG) 6.25 MG tablet Take 1 tablet (6.25  mg total) by mouth 2 (two) times daily. 180 tablet 3  . clopidogrel (PLAVIX) 75 MG tablet Take 1 tablet (75 mg total) by mouth daily. 30 tablet 1  . feeding supplement, ENSURE ENLIVE, (ENSURE ENLIVE) LIQD Take 237 mLs by mouth 2 (two) times daily between meals. 237 mL 0  . furosemide (LASIX) 80 MG tablet Take 1 tablet (80 mg total) by mouth 2 (two) times daily. 60 tablet 0  . levothyroxine (SYNTHROID, LEVOTHROID) 25 MCG tablet  Take 25 mcg by mouth daily before breakfast.    . potassium chloride SA (K-DUR,KLOR-CON) 20 MEQ tablet Take 1 tablet (20 mEq total) by mouth 2 (two) times daily. 60 tablet 0  . pravastatin (PRAVACHOL) 80 MG tablet Take 1 tablet (80 mg total) by mouth at bedtime. 30 tablet 11   No current facility-administered medications for this visit.     Allergies:   Tramadol   Social History:  The patient  reports that she has never smoked. She has never used smokeless tobacco. She reports that she does not drink alcohol or use drugs.   Family History:  The patient's family history includes Breast cancer in her sister; Colon cancer in her sister; Diabetes Mellitus II in her sister; Stroke in her brother.    ROS:  Please see the history of present illness.  Otherwise, review of systems is positive for cough, muscle pain, dizziness, headaches.  All other systems are reviewed and negative.    PHYSICAL EXAM: VS:  BP 100/62   Pulse 70   Ht 5\' 3"  (1.6 m)   Wt 146 lb (66.2 kg)   LMP 02/12/2013   SpO2 95%   BMI 25.86 kg/m  , BMI Body mass index is 25.86 kg/m. GEN: Elderly appearing female, in no acute distress  HEENT: normal  Neck: no JVD, no masses. No carotid bruits Cardiac: RRR with 3/6 systolic murmur heard at the apex with the patient in left lateral decubitus position           Respiratory:  clear to auscultation bilaterally, normal work of breathing GI: soft, nontender, nondistended, + BS MS: no deformity or atrophy  Ext: no pretibial edema, pedal pulses 2+= bilaterally Skin: warm and dry, no rash Neuro:  Strength and sensation are intact Psych: euthymic mood, full affect  EKG:  EKG is not ordered today.  Recent Labs: 05/11/2017: Magnesium 2.1 09/07/2017: B Natriuretic Peptide 673.7; TSH 2.932 09/08/2017: ALT 25 09/11/2017: Hemoglobin 13.4; Platelets 273 10/04/2017: BUN 32; Creatinine, Ser 1.29; Potassium 3.9; Sodium 142   Lipid Panel     Component Value Date/Time   CHOL 126 09/08/2017  0356   TRIG 24 09/08/2017 0356   HDL 52 09/08/2017 0356   CHOLHDL 2.4 09/08/2017 0356   VLDL 5 09/08/2017 0356   LDLCALC 69 09/08/2017 0356      Wt Readings from Last 3 Encounters:  11/14/17 146 lb (66.2 kg)  10/04/17 145 lb 12.8 oz (66.1 kg)  10/03/17 147 lb (66.7 kg)     Cardiac Studies Reviewed: 2D Echo 09/08/2017: Study Conclusions  - Left ventricle: The cavity size was normal. Wall thickness was   increased in a pattern of moderate to severe LVH. Systolic   function was mildly to moderately reduced. The estimated ejection   fraction was in the range of 40% to 45%. Wall motion was normal;   there were no regional wall motion abnormalities. - Mitral valve: There was severe regurgitation directed   posteriorly. - Left atrium: The atrium was  severely dilated. - Right atrium: The atrium was severely dilated. - Tricuspid valve: There was moderate regurgitation. - Pulmonary arteries: Systolic pressure was moderately increased.   PA peak pressure: 49 mm Hg (S).  2D Echo limited 11/15/2017: Study Conclusions  - Left ventricle: The cavity size was mildly dilated. Wall   thickness was increased in a pattern of mild LVH. Systolic   function was normal. The estimated ejection fraction was in the   range of 55% to 60%. Doppler parameters are consistent with both   elevated ventricular end-diastolic filling pressure and elevated   left atrial filling pressure. - Mitral valve: There was moderate to severe regurgitation. - Left atrium: The atrium was moderately dilated. - Atrial septum: No defect or patent foramen ovale was identified. - Pulmonary arteries: PA peak pressure: 55 mm Hg (S).  ASSESSMENT AND PLAN: 72 yo woman with amyloid heart disease and severe, Stage D, mitral regurgitation. The patient's mitral regurgitation is of mixed etiology, which includes primary abnormality of the mitral valve apparatus with thickening and leaflet restriction. There is also a clear component  of myocardial disease related to amyloid heart disease. The patient's LV systolic function is preserved with LVEF 55-60% on today's limited study. Today's study demonstrates that even when her heart failure is better compensated, she exhibits severe mitral regurgitation and pulmonary hypertension. We discussed further treatment options at length today. She is considered a poor candidate for conventional mitral valve surgery because of her comorbid medical conditions, specifically amyloid heart disease. Edge to edge mitral approximation appears to be a reasonable treatment alternative to high risk conventional surgery. Based on review of her TEE study, she appears to have anatomic features suitable for edge-to-edge mitral valve approximation. The patient and her son are counseled at length today regarding procedural risks, expectations, and recovery. Risks include but are not limited to stroke, MI, vascular injury, clip embolization or single leaflet device attachment (SLDA), cardiac injury, perforation, tamponade, and death. She understands these risks occur at very low frequency with less than 2% risk of serious complication. The patient provides full informed consent for MitraClip surgery which will be scheduled at the next available time. She will remain on ASA and clopidogrel. She understands the need for SBE prophylaxis after MItraClip. All of her questions are answered.   Current medicines are reviewed with the patient today.  The patient does not have concerns regarding medicines.  Labs/ tests ordered today include:   Orders Placed This Encounter  Procedures  . ECHOCARDIOGRAM LIMITED   Signed, Sherren Mocha, MD  11/15/2017 11:01 PM    [facility] [address], [place], [place]  [address] Phone: [phone]; Fax: [phone]   ADDENDUM: Procedure: Isolated MVR  CALCULATE  Risk of Mortality: 3.577%  Renal Failure: 4.553%  Permanent Stroke: 2.076%  Prolonged  Ventilation: 15.043%  DSW Infection: 0.076%  Reoperation: 5.075%  Morbidity or Mortality: 19.970%  Short Length of Stay: 15.840%  Long Length of Stay: 11.014%    Procedure: MV Repair  CALCULATE  Risk of Mortality: 1.742%  Renal Failure: 1.884%  Permanent Stroke: 1.648%  Prolonged Ventilation: 8.970%  DSW Infection: 0.040%  Reoperation: 3.413%  Morbidity or Mortality: 12.855%  Short Length of Stay: 27.079%  Long Length of Stay: 5.619%   Sherren Mocha 02/05/2018 8:05 AM

## 2017-11-14 NOTE — H&P (View-Only) (Signed)
Cardiology Office Note Date:  11/15/2017   ID:  Katie [name], DOB 1946/02/06, MRN [id number]  PCP:  Seward Carol, MD  Cardiologist:  Sherren Mocha, MD    Chief Complaint  Patient presents with  . Shortness of Breath     History of Present Illness: Katie [name] is a 72 y.o. female who presents for follow-up of severe mitral regurgitation.  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 She was initially seen September 28, 2017 for consideration of MitraClip for treatment of severe, symptomatic mitral regurgitation.   She is here with her son today. Complaining of a feeling of 'palpitations' in the upper chest, but describes this as a pressure-like sensation in the upper chest and neck. This is not associated with physical exertion. Symptoms last 10-15 minutes then resolve on their own. Denies edema, orthopnea, or PND. She continues to have shortness of breath with modest activity such as walking on level ground.   Past Medical History:  Diagnosis Date  . Amyloid heart disease ([place])   . Chronic combined systolic and diastolic CHF (congestive heart failure) ([place])   . CKD (chronic kidney disease), stage III ([place])    removal of right kidney due to carcinoma  . Headache   . History of kidney cancer    a. s/p right nephrectomy ~2007.  Marland Kitchen History of loop recorder   . Hypercholesterolemia   . Hypertension   . Mitral regurgitation   . Pulmonary hypertension ([place])   . Stroke [facility])     Past Surgical History:  Procedure Laterality Date  . BREAST EXCISIONAL BIOPSY Left   . BREAST EXCISIONAL BIOPSY Right   . CARDIAC CATHETERIZATION N/A 01/12/2016   Procedure: Right/Left Heart Cath and Coronary/Graft Angiography;  Surgeon: Belva Crome, MD;  Location: [facility];  Service: Cardiovascular;  Laterality: N/A;  . CERVICAL POLYPECTOMY N/A 04/23/2013   Procedure: CERVICAL POLYPECTOMY;  Surgeon: Maeola Sarah. Landry Mellow, MD;  Location: [facility];  Service: Gynecology;  Laterality: N/A;  Possible Polypectomy  . HYSTEROSCOPY W/D&C N/A 04/23/2013   Procedure: DILATATION AND CURETTAGE /HYSTEROSCOPY;  Surgeon: Maeola Sarah. Landry Mellow, MD;  Location: [facility];  Service: Gynecology;  Laterality: N/A;  . IR ANGIO INTRA EXTRACRAN SEL COM CAROTID INNOMINATE BILAT MOD SED  02/11/2017  . IR ANGIO VERTEBRAL SEL VERTEBRAL BILAT MOD SED  02/11/2017  . KNEE ARTHROSCOPY Left   . LOOP RECORDER INSERTION N/A 02/18/2017   Procedure: Loop Recorder Insertion;  Surgeon: Constance Haw, MD;  Location: [facility];  Service: Cardiovascular;  Laterality: N/A;  . NEPHRECTOMY Right    due to carcinoma  . TEE WITHOUT CARDIOVERSION N/A 02/18/2017   Procedure: TRANSESOPHAGEAL ECHOCARDIOGRAM (TEE) WITH LOOP;  Surgeon: Acie Fredrickson Wonda Cheng, MD;  Location: [facility] ENDOSCOPY;  Service: Cardiovascular;  Laterality: N/A;    Current Outpatient Medications  Medication Sig Dispense Refill  . acetaminophen (TYLENOL) 500 MG tablet Take 1,000 mg by mouth every 6 (six) hours as needed for headache (pain).    Marland Kitchen aspirin 81 MG tablet Take 1 tablet (81 mg total) daily by mouth.    . butalbital-acetaminophen-caffeine (FIORICET, ESGIC) 50-325-40 MG tablet Take 1 tablet by mouth every 12 (twelve) hours as needed for headache. 14 tablet 0  . carvedilol (COREG) 6.25 MG tablet Take 1 tablet (6.25  mg total) by mouth 2 (two) times daily. 180 tablet 3  . clopidogrel (PLAVIX) 75 MG tablet Take 1 tablet (75 mg total) by mouth daily. 30 tablet 1  . feeding supplement, ENSURE ENLIVE, (ENSURE ENLIVE) LIQD Take 237 mLs by mouth 2 (two) times daily between meals. 237 mL 0  . furosemide (LASIX) 80 MG tablet Take 1 tablet (80 mg total) by mouth 2 (two) times daily. 60 tablet 0  . levothyroxine (SYNTHROID, LEVOTHROID) 25 MCG tablet  Take 25 mcg by mouth daily before breakfast.    . potassium chloride SA (K-DUR,KLOR-CON) 20 MEQ tablet Take 1 tablet (20 mEq total) by mouth 2 (two) times daily. 60 tablet 0  . pravastatin (PRAVACHOL) 80 MG tablet Take 1 tablet (80 mg total) by mouth at bedtime. 30 tablet 11   No current facility-administered medications for this visit.     Allergies:   Tramadol   Social History:  The patient  reports that she has never smoked. She has never used smokeless tobacco. She reports that she does not drink alcohol or use drugs.   Family History:  The patient's family history includes Breast cancer in her sister; Colon cancer in her sister; Diabetes Mellitus II in her sister; Stroke in her brother.    ROS:  Please see the history of present illness.  Otherwise, review of systems is positive for cough, muscle pain, dizziness, headaches.  All other systems are reviewed and negative.    PHYSICAL EXAM: VS:  BP 100/62   Pulse 70   Ht 5\' 3"  (1.6 m)   Wt 146 lb (66.2 kg)   LMP 02/12/2013   SpO2 95%   BMI 25.86 kg/m  , BMI Body mass index is 25.86 kg/m. GEN: Elderly appearing female, in no acute distress  HEENT: normal  Neck: no JVD, no masses. No carotid bruits Cardiac: RRR with 3/6 systolic murmur heard at the apex with the patient in left lateral decubitus position           Respiratory:  clear to auscultation bilaterally, normal work of breathing GI: soft, nontender, nondistended, + BS MS: no deformity or atrophy  Ext: no pretibial edema, pedal pulses 2+= bilaterally Skin: warm and dry, no rash Neuro:  Strength and sensation are intact Psych: euthymic mood, full affect  EKG:  EKG is not ordered today.  Recent Labs: 05/11/2017: Magnesium 2.1 09/07/2017: B Natriuretic Peptide 673.7; TSH 2.932 09/08/2017: ALT 25 09/11/2017: Hemoglobin 13.4; Platelets 273 10/04/2017: BUN 32; Creatinine, Ser 1.29; Potassium 3.9; Sodium 142   Lipid Panel     Component Value Date/Time   CHOL 126 09/08/2017  0356   TRIG 24 09/08/2017 0356   HDL 52 09/08/2017 0356   CHOLHDL 2.4 09/08/2017 0356   VLDL 5 09/08/2017 0356   LDLCALC 69 09/08/2017 0356      Wt Readings from Last 3 Encounters:  11/14/17 146 lb (66.2 kg)  10/04/17 145 lb 12.8 oz (66.1 kg)  10/03/17 147 lb (66.7 kg)     Cardiac Studies Reviewed: 2D Echo 09/08/2017: Study Conclusions  - Left ventricle: The cavity size was normal. Wall thickness was   increased in a pattern of moderate to severe LVH. Systolic   function was mildly to moderately reduced. The estimated ejection   fraction was in the range of 40% to 45%. Wall motion was normal;   there were no regional wall motion abnormalities. - Mitral valve: There was severe regurgitation directed   posteriorly. - Left atrium: The atrium was  severely dilated. - Right atrium: The atrium was severely dilated. - Tricuspid valve: There was moderate regurgitation. - Pulmonary arteries: Systolic pressure was moderately increased.   PA peak pressure: 49 mm Hg (S).  2D Echo limited 11/15/2017: Study Conclusions  - Left ventricle: The cavity size was mildly dilated. Wall   thickness was increased in a pattern of mild LVH. Systolic   function was normal. The estimated ejection fraction was in the   range of 55% to 60%. Doppler parameters are consistent with both   elevated ventricular end-diastolic filling pressure and elevated   left atrial filling pressure. - Mitral valve: There was moderate to severe regurgitation. - Left atrium: The atrium was moderately dilated. - Atrial septum: No defect or patent foramen ovale was identified. - Pulmonary arteries: PA peak pressure: 55 mm Hg (S).  ASSESSMENT AND PLAN: 72 yo woman with amyloid heart disease and severe, Stage D, mitral regurgitation. The patient's mitral regurgitation is of mixed etiology, which includes primary abnormality of the mitral valve apparatus with thickening and leaflet restriction. There is also a clear component  of myocardial disease related to amyloid heart disease. The patient's LV systolic function is preserved with LVEF 55-60% on today's limited study. Today's study demonstrates that even when her heart failure is better compensated, she exhibits severe mitral regurgitation and pulmonary hypertension. We discussed further treatment options at length today. She is considered a poor candidate for conventional mitral valve surgery because of her comorbid medical conditions, specifically amyloid heart disease. Edge to edge mitral approximation appears to be a reasonable treatment alternative to high risk conventional surgery. Based on review of her TEE study, she appears to have anatomic features suitable for edge-to-edge mitral valve approximation. The patient and her son are counseled at length today regarding procedural risks, expectations, and recovery. Risks include but are not limited to stroke, MI, vascular injury, clip embolization or single leaflet device attachment (SLDA), cardiac injury, perforation, tamponade, and death. She understands these risks occur at very low frequency with less than 2% risk of serious complication. The patient provides full informed consent for MitraClip surgery which will be scheduled at the next available time. She will remain on ASA and clopidogrel. She understands the need for SBE prophylaxis after MItraClip. All of her questions are answered.   Current medicines are reviewed with the patient today.  The patient does not have concerns regarding medicines.  Labs/ tests ordered today include:   Orders Placed This Encounter  Procedures  . ECHOCARDIOGRAM LIMITED    Signed, Sherren Mocha, MD  11/15/2017 11:01 PM    [facility] [place], [place], [place]  [address] Phone: [phone]; Fax: [phone]

## 2017-11-15 ENCOUNTER — Other Ambulatory Visit: Payer: Self-pay

## 2017-11-15 ENCOUNTER — Ambulatory Visit (HOSPITAL_COMMUNITY): Payer: Medicare Other | Attending: Cardiovascular Disease

## 2017-11-15 ENCOUNTER — Encounter: Payer: Self-pay | Admitting: Cardiovascular Disease

## 2017-11-15 DIAGNOSIS — I509 Heart failure, unspecified: Secondary | ICD-10-CM | POA: Insufficient documentation

## 2017-11-15 DIAGNOSIS — I110 Hypertensive heart disease with heart failure: Secondary | ICD-10-CM | POA: Diagnosis not present

## 2017-11-15 DIAGNOSIS — I340 Nonrheumatic mitral (valve) insufficiency: Secondary | ICD-10-CM | POA: Diagnosis not present

## 2017-11-15 DIAGNOSIS — E785 Hyperlipidemia, unspecified: Secondary | ICD-10-CM | POA: Insufficient documentation

## 2017-11-21 ENCOUNTER — Ambulatory Visit (INDEPENDENT_AMBULATORY_CARE_PROVIDER_SITE_OTHER): Payer: Medicare Other | Admitting: *Deleted

## 2017-11-21 DIAGNOSIS — I639 Cerebral infarction, unspecified: Secondary | ICD-10-CM | POA: Diagnosis not present

## 2017-11-22 ENCOUNTER — Ambulatory Visit: Payer: Self-pay

## 2017-11-22 NOTE — Progress Notes (Signed)
Carelink Summary Report / Loop Recorder 

## 2017-11-23 ENCOUNTER — Other Ambulatory Visit: Payer: Self-pay

## 2017-11-23 ENCOUNTER — Other Ambulatory Visit: Payer: Self-pay | Admitting: Cardiovascular Disease

## 2017-11-23 DIAGNOSIS — I340 Nonrheumatic mitral (valve) insufficiency: Secondary | ICD-10-CM

## 2017-11-23 NOTE — Patient Outreach (Signed)
[facility]) Care Management  [place]  11/23/2017   STARLYN DROGE 1946/04/01 [id number]  Subjective: Telephone call to the patient for monthly assessment.  HIPAA verified.  The patient states that she is doing ok.  She states that she has a headache that she rates a 4/10 and she has taken tylenol .  She has had some shortness of breath that is off and on.  She states that she notified her physician about it.  She states that she has not had any swelling.  Her weight today  Is 146. She said that she is maintaining.  She is adherent with her medications.  She is monitoring her diet for salt.  The patient states that she is exercising by walking.  She states that she sill have surgery next week next May 1 for valve leakage.  Encounter Medications:  Outpatient Encounter Medications as of 11/23/2017  Medication Sig  . acetaminophen (TYLENOL) 500 MG tablet Take 1,000 mg by mouth every 6 (six) hours as needed for headache (pain).  Marland Kitchen aspirin 81 MG tablet Take 1 tablet (81 mg total) daily by mouth.  . butalbital-acetaminophen-caffeine (FIORICET, ESGIC) 50-325-40 MG tablet Take 1 tablet by mouth every 12 (twelve) hours as needed for headache.  . carvedilol (COREG) 6.25 MG tablet Take 1 tablet (6.25 mg total) by mouth 2 (two) times daily.  . clopidogrel (PLAVIX) 75 MG tablet Take 1 tablet (75 mg total) by mouth daily.  . feeding supplement, ENSURE ENLIVE, (ENSURE ENLIVE) LIQD Take 237 mLs by mouth 2 (two) times daily between meals.  . furosemide (LASIX) 80 MG tablet Take 1 tablet (80 mg total) by mouth 2 (two) times daily.  . Hypromellose (ARTIFICIAL TEARS OP) Place 1 drop into both eyes daily as needed (dry eyes).  Marland Kitchen levothyroxine (SYNTHROID, LEVOTHROID) 25 MCG tablet Take 25 mcg by mouth daily before breakfast.  . potassium chloride SA (K-DUR,KLOR-CON) 20 MEQ tablet Take 1 tablet (20 mEq total) by mouth 2 (two) times daily.  . pravastatin (PRAVACHOL) 80 MG tablet Take 1 tablet  (80 mg total) by mouth at bedtime.   No facility-administered encounter medications on file as of 11/23/2017.     Functional Status:  In your present state of health, do you have any difficulty performing the following activities: 09/08/2017 05/08/2017  Hearing? N N  Vision? Y N  Comment "left eye gets blurry sometimes" -  Difficulty concentrating or making decisions? N N  Walking or climbing stairs? Y N  Comment "I don't do stairs; I get short of breath walking" -  Dressing or bathing? N N  Doing errands, shopping? N N  Preparing Food and eating ? - -  Using the Toilet? - -  In the past six months, have you accidently leaked urine? - -  Do you have problems with loss of bowel control? - -  Managing your Medications? - -  Managing your Finances? - -  Housekeeping or managing your Housekeeping? - -  Some recent data might be hidden    Fall/Depression Screening: Fall Risk  11/23/2017 10/26/2017 09/28/2017  Falls in the past year? No No Yes  Number falls in past yr: - - 1  Injury with Fall? - - -  Risk for fall due to : - - -  Follow up - - Falls prevention discussed   PHQ 2/9 Scores 07/28/2017 03/03/2017 02/22/2017  PHQ - 2 Score 0 0 0    Assessment: Patient continues to benefit from health  coach outreach for disease management and support.    THN CM Care Plan Problem One     Most Recent Value  [facility] Long Term Goal   In the next 90 days the patient will not be admitted to the hospital  [facility] Term Goal Start Date  11/23/17  Interventions for Problem One Long Term Goal  [facility] reviewed with patient signs and symptoms of heart failure, discussed with patient `, encouraged patient to weigh daily     Plan: [facility] will contact patient in the month of May and patient agrees to next outreach.   Lazaro Arms RN, BSN, [place] Direct Dial:  [phone]  Fax: [phone]

## 2017-11-24 ENCOUNTER — Ambulatory Visit: Payer: Medicare Other | Attending: Thoracic Surgery (Cardiothoracic Vascular Surgery) | Admitting: Physical Therapy

## 2017-11-24 ENCOUNTER — Other Ambulatory Visit: Payer: Self-pay

## 2017-11-24 ENCOUNTER — Encounter: Payer: Self-pay | Admitting: Physical Therapy

## 2017-11-24 DIAGNOSIS — R2689 Other abnormalities of gait and mobility: Secondary | ICD-10-CM | POA: Diagnosis not present

## 2017-11-24 NOTE — Pre-Procedure Instructions (Signed)
Katie [name]  11/24/2017      [id number] - [place], [place] - [address] [place] [address] Phone: [phone] Fax: [phone]    Your procedure is scheduled on Wednesday May 1.  Report to [facility] Admitting at 6:30 A.M.  Call this number if you have problems the morning of surgery:  [phone]   Remember:  Do not eat food or drink liquids after midnight.  Take these medicines the morning of surgery with A SIP OF WATER:   Carvedilol (coreg) Aspirin Clopidogrel (Plavix) Levothyroxine (synthroid)  7 days prior to surgery STOP taking any Aleve, Naproxen, Ibuprofen, Motrin, Advil, Goody's, BC's, all herbal medications, fish oil, and all vitamins    Do not wear jewelry, make-up or nail polish.  Do not wear lotions, powders, or perfumes, or deodorant.  Do not shave 48 hours prior to surgery.  Men may shave face and neck.  Do not bring valuables to the hospital.  [facility] is not responsible for any belongings or valuables.  Contacts, dentures or bridgework may not be worn into surgery.  Leave your suitcase in the car.  After surgery it may be brought to your room.  For patients admitted to the hospital, discharge time will be determined by your treatment team.  Patients discharged the day of surgery will not be allowed to drive home.   Special instructions:    [place]- Preparing For Surgery  Before surgery, you can play an important role. Because skin is not sterile, your skin needs to be as free of germs as possible. You can reduce the number of germs on your skin by washing with CHG (chlorahexidine gluconate) Soap before surgery.  CHG is an antiseptic cleaner which kills germs and bonds with the skin to continue killing germs even after washing.  Please do not use if you have an allergy to CHG or antibacterial soaps. If your skin becomes  reddened/irritated stop using the CHG.  Do not shave (including legs and underarms) for at least 48 hours prior to first CHG shower. It is OK to shave your face.  Please follow these instructions carefully.   1. Shower the NIGHT BEFORE SURGERY and the MORNING OF SURGERY with CHG.   2. If you chose to wash your hair, wash your hair first as usual with your normal shampoo.  3. After you shampoo, rinse your hair and body thoroughly to remove the shampoo.  4. Use CHG as you would any other liquid soap. You can apply CHG directly to the skin and wash gently with a scrungie or a clean washcloth.   5. Apply the CHG Soap to your body ONLY FROM THE NECK DOWN.  Do not use on open wounds or open sores. Avoid contact with your eyes, ears, mouth and genitals (private parts). Wash Face and genitals (private parts)  with your normal soap.  6. Wash thoroughly, paying special attention to the area where your surgery will be performed.  7. Thoroughly rinse your body with warm water from the neck down.  8. DO NOT shower/wash with your normal soap after using and rinsing off the CHG Soap.  9. Pat yourself dry with a CLEAN TOWEL.  10. Wear CLEAN PAJAMAS to bed the night before surgery, wear comfortable clothes the morning of surgery  11. Place CLEAN SHEETS on your bed the night of your first shower and DO NOT  SLEEP WITH PETS.    Day of Surgery: Do not apply any deodorants/lotions. Please wear clean clothes to the hospital/surgery center.      Please read over the following fact sheets that you were given. Coughing and Deep Breathing, MRSA Information and Surgical Site Infection Prevention

## 2017-11-24 NOTE — Therapy (Signed)
[place], [place], [address] Phone: [phone]   Fax:  [phone]  Physical Therapy Evaluation  Patient Details  Name: Katie [name] MRN: [id number] Date of Birth: Jan 10, 1946 Referring Provider: Sherren Mocha MD   Encounter Date: 11/24/2017  PT End of Session - 11/24/17 1230    Visit Number  1    Number of Visits  1    Date for PT Re-Evaluation  11/25/17    PT Start Time  1146    PT Stop Time  1228    PT Time Calculation (min)  42 min    Equipment Utilized During Treatment  Gait belt    Activity Tolerance  Patient tolerated treatment well;Patient limited by fatigue;Patient limited by pain LLE pain    Behavior During Therapy  [facility] for tasks assessed/performed       Past Medical History:  Diagnosis Date  . 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 Chronic combined systolic and diastolic CHF (congestive heart failure) ([place])   . CKD (chronic kidney disease), stage III ([place])    removal of right kidney due to carcinoma  . Headache   . History of kidney cancer    a. s/p right nephrectomy ~2007.  Marland Kitchen History of loop recorder   . Hypercholesterolemia   . Hypertension   . Mitral regurgitation   . Pulmonary hypertension ([place])   . Stroke [facility])     Past Surgical History:  Procedure Laterality Date  . BREAST EXCISIONAL BIOPSY Left   . BREAST EXCISIONAL BIOPSY Right   . CARDIAC CATHETERIZATION N/A 01/12/2016   Procedure: Right/Left Heart Cath and Coronary/Graft Angiography;  Surgeon: Belva Crome, MD;  Location: [place] [facility];  Service: Cardiovascular;  Laterality: N/A;  . CERVICAL POLYPECTOMY N/A 04/23/2013   Procedure: CERVICAL POLYPECTOMY;  Surgeon: Maeola Sarah. Landry Mellow, MD;  Location: [place] [facility];  Service: Gynecology;  Laterality: N/A;  Possible Polypectomy  . HYSTEROSCOPY W/D&C N/A 04/23/2013   Procedure: DILATATION AND CURETTAGE /HYSTEROSCOPY;  Surgeon: Maeola Sarah. Landry Mellow, MD;  Location: [place] [facility];  Service: Gynecology;   Laterality: N/A;  . IR ANGIO INTRA EXTRACRAN SEL COM CAROTID INNOMINATE BILAT MOD SED  02/11/2017  . IR ANGIO VERTEBRAL SEL VERTEBRAL BILAT MOD SED  02/11/2017  . KNEE ARTHROSCOPY Left   . LOOP RECORDER INSERTION N/A 02/18/2017   Procedure: Loop Recorder Insertion;  Surgeon: Constance Haw, MD;  Location: [place] [facility];  Service: Cardiovascular;  Laterality: N/A;  . NEPHRECTOMY Right    due to carcinoma  . TEE WITHOUT CARDIOVERSION N/A 02/18/2017   Procedure: TRANSESOPHAGEAL ECHOCARDIOGRAM (TEE) WITH LOOP;  Surgeon: Acie Fredrickson Wonda Cheng, MD;  Location: [facility];  Service: Cardiovascular;  Laterality: N/A;    There were no vitals filed for this visit.   Subjective Assessment - 11/24/17 1153    Subjective  ptis 72 y.o with CC of SOB and and tightness in the chest with exertion and can fluctuate from day to day, reporting it has worsened since 2018. pt reports she used to live alone but her son started staying with her. reports pain in in both legs which has been going on for many years.     Limitations  Standing    How long can you sit comfortably?  5 min    How long can you stand comfortably?  30 min    How long can you walk comfortably?  30 min    Patient Stated Goals  to get hear better  Currently in Pain?  Yes    Pain Score  3     Pain Location  Leg    Pain Orientation  Right;Left    Pain Descriptors / Indicators  Aching;Sore    Aggravating Factors   standing/ walking     Pain Relieving Factors  sitting and resting         [facility] PT Assessment - 11/24/17 1157      Assessment   Medical Diagnosis  Mitral reguritation     Referring Provider  Sherren Mocha MD    Onset Date/Surgical Date  -- starting since 2018    Hand Dominance  Left    Prior Therapy  yes for stroke      Precautions   Precautions  None      Restrictions   Weight Bearing Restrictions  No      Balance Screen   Has the patient fallen in the past 6 months  No      [place] residence    Living Arrangements  Children    Available Help at Discharge  Family;Available PRN/intermittently    Type of Home  Apartment    Home Access  Level entry    Home Layout  One level    [place] - single point      Prior Function   Level of Independence  Independent with basic ADLs    Vocation  Retired      Associate Professor   Overall Cognitive Status  Within Functional Limits for tasks assessed      Posture/Postural Control   Posture/Postural Control  Postural limitations    Postural Limitations  Rounded Shoulders;Forward head      ROM / Strength   AROM / PROM / Strength  AROM;Strength      AROM   Overall AROM   Within functional limits for tasks performed      Strength   Overall Strength  Within functional limits for tasks performed    Strength Assessment Site  Hand    Right Hand Grip (lbs)  33    Left Hand Grip (lbs)  59      Ambulation/Gait   Ambulation/Gait  Yes    Gait Pattern  Step-through pattern;Decreased stance time - left;Decreased step length - right;Antalgic;Trendelenburg;Decreased trunk rotation decreased arm swing       [facility] Pre-Surgical Assessment - 11/24/17 0001    5 Meter Walk Test- trial 1  6 sec    5 Meter Walk Test- trial 2  7 sec.     5 Meter Walk Test- trial 3  7 sec.    5 meter walk test average  6.67 sec    4 Stage Balance Test tolerated for:   8 sec.    4 Stage Balance Test Position  3    Sit To Stand Test- trial 1  19 sec.    ADL/IADL Independent with:  Bathing;Dressing;Meal prep;Finances    ADL/IADL Needs Assistance with:  Valla Leaver work    ADL/IADL Fraility Index  Midly frail    6 Minute Walk- Baseline  yes    BP (mmHg)  105/71    HR (bpm)  65    02 Sat (%RA)  99 %    Modified Borg Scale for Dyspnea  0- Nothing at all    Perceived Rate of Exertion (Borg)  9- very light    6 Minute Walk Post Test  yes    BP (mmHg)  127/71    HR (bpm)  102    02 Sat (%RA)  98 %    Modified Borg Scale for Dyspnea  0-  Nothing at all    Perceived Rate of Exertion (Borg)  13- Somewhat hard    Aerobic Endurance Distance Walked  824    Endurance additional comments  pt demonstrated 46% limitation per age related norm              Objective measurements completed on examination: See above findings.              PT Education - 11/24/17 1232    Education provided  Yes    Education Details  fraility score and benefits of using an assistive device for safety as needed and to help support the LLE    Person(s) Educated  Patient    Methods  Explanation    Comprehension  Verbalized understanding                  Plan - 11/24/17 1231    Clinical Impression Statement  see assessment in note    Clinical Decision Making  Low    PT Frequency  One time visit    PT Next Visit Plan  Mitraclip evaluation    Consulted and Agree with Plan of Care  Patient           Clinical Impression Statement: Pt is a 72 yo F presenting to OP PT for evaluation prior to possible Mitraclip surgery due to Mitral regurgitation. Pt reports onset of SOB and chest tightness increasing in severity since early 2018. Symptoms are limiting endurance. Pt presents with functional ROM and strength, limited balance and is moderate at high fall risk 4 stage balance test, poor walking speed and poor aerobic endurance per 6 minute walk test. Pt ambulated 3:14 before requesting a seated rest beak lasting 30 sec. At time of rest, patient's HR was 102 bpm and O2 was 97% on room air. Pt reported 0/10 shortness of breath on modified scale for dyspnea. Pt ambulated a total of 824 feet in 6 minute walk. LLE pain, and antalgic pattern increased significantly with 6 minute walk test. Based on the Short Physical Performance Battery, patient has a frailty rating of 11/12 with </= 5/12 considered frail.    Patient demonstrated the following deficits and impairments:     Visit Diagnosis: Other abnormalities of gait and  mobility     Problem List Patient Active Problem List   Diagnosis Date Noted  . 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 Myopericarditis 09/06/2017  . Non-rheumatic mitral regurgitation   . Chronic diastolic congestive heart failure ([place]) 05/08/2017  . CHF exacerbation ([place]) 05/08/2017  . Chronic migraine without aura without status migrainosus, not intractable   . Hypertension 02/15/2017  . Acute on chronic diastolic CHF (congestive heart failure) ([place]) 02/15/2017  . Basilar artery occlusion   . Cerebellar stroke ([place]) 02/10/2017  . Headache 02/10/2017  . Chest pain   . Mitral regurgitation 01/08/2016  . Moderate tricuspid regurgitation 01/08/2016  . Pulmonary HTN ([place]) 01/08/2016  . Acute CHF (congestive heart failure) ([place]) 01/07/2016  . Elevated troponin 01/07/2016  . Mild hypertension 01/07/2016  . CKD (chronic kidney disease), stage III ([place]) 01/07/2016  . Hyperlipidemia 01/07/2016  . Acute CHF ([place]) 01/07/2016   Starr Lake PT, DPT, LAT, ATC  11/24/17  12:35 PM      [facility] [address] [place], [place], [address]  Phone: [phone]   Fax:  [phone]  Name: Katie [name] MRN: [id number] Date of Birth: [birth year]

## 2017-11-25 ENCOUNTER — Encounter (HOSPITAL_COMMUNITY): Payer: Self-pay

## 2017-11-25 ENCOUNTER — Other Ambulatory Visit: Payer: Self-pay

## 2017-11-25 ENCOUNTER — Encounter (HOSPITAL_COMMUNITY)
Admission: RE | Admit: 2017-11-25 | Discharge: 2017-11-25 | Disposition: A | Discharge status: Home / Self Care | Payer: Medicare Other | Source: Ambulatory Visit | Attending: Cardiovascular Disease | Admitting: Cardiovascular Disease

## 2017-11-25 ENCOUNTER — Ambulatory Visit (HOSPITAL_COMMUNITY)
Admission: RE | Admit: 2017-11-25 | Discharge: 2017-11-25 | Disposition: A | Discharge status: Home / Self Care | Payer: Medicare Other | Source: Ambulatory Visit | Attending: Cardiovascular Disease | Admitting: Cardiovascular Disease

## 2017-11-25 DIAGNOSIS — I340 Nonrheumatic mitral (valve) insufficiency: Secondary | ICD-10-CM

## 2017-11-25 DIAGNOSIS — I517 Cardiomegaly: Secondary | ICD-10-CM | POA: Diagnosis not present

## 2017-11-25 DIAGNOSIS — Z01818 Encounter for other preprocedural examination: Secondary | ICD-10-CM | POA: Diagnosis present

## 2017-11-25 DIAGNOSIS — Z01812 Encounter for preprocedural laboratory examination: Secondary | ICD-10-CM | POA: Diagnosis not present

## 2017-11-25 DIAGNOSIS — Z01810 Encounter for preprocedural cardiovascular examination: Secondary | ICD-10-CM | POA: Diagnosis present

## 2017-11-25 HISTORY — DX: Malignant (primary) neoplasm, unspecified: C80.1

## 2017-11-25 HISTORY — DX: Hypothyroidism, unspecified: E03.9

## 2017-11-25 LAB — COMPREHENSIVE METABOLIC PANEL
ALT: 17 U/L (ref 14–54)
AST: 22 U/L (ref 15–41)
Albumin: 3.7 g/dL (ref 3.5–5.0)
Alkaline Phosphatase: 130 U/L — ABNORMAL HIGH (ref 38–126)
Anion gap: 11 (ref 5–15)
BUN: 23 mg/dL — ABNORMAL HIGH (ref 6–20)
CO2: 23 mmol/L (ref 22–32)
Calcium: 9.5 mg/dL (ref 8.9–10.3)
Chloride: 106 mmol/L (ref 101–111)
Creatinine, Ser: 1.33 mg/dL — ABNORMAL HIGH (ref 0.44–1.00)
GFR calc Af Amer: 45 mL/min — ABNORMAL LOW (ref >60)
GFR calc non Af Amer: 39 mL/min — ABNORMAL LOW (ref >60)
Glucose, Bld: 113 mg/dL — ABNORMAL HIGH (ref 65–99)
Potassium: 3.9 mmol/L (ref 3.5–5.1)
Sodium: 140 mmol/L (ref 135–145)
Total Bilirubin: 0.7 mg/dL (ref 0.3–1.2)
Total Protein: 7.2 g/dL (ref 6.5–8.1)

## 2017-11-25 LAB — BLOOD GAS, ARTERIAL
Acid-Base Excess: 4.4 mmol/L — ABNORMAL HIGH (ref 0.0–2.0)
Bicarbonate: 28.3 mmol/L — ABNORMAL HIGH (ref 20.0–28.0)
Drawn by: 470591
FIO2: 21
O2 Saturation: 98.4 %
Patient temperature: 98.6
pCO2 arterial: 42.3 mmHg (ref 32.0–48.0)
pH, Arterial: 7.441 (ref 7.350–7.450)
pO2, Arterial: 116 mmHg — ABNORMAL HIGH (ref 83.0–108.0)

## 2017-11-25 LAB — PROTIME-INR
INR: 1.1
Prothrombin Time: 14.1 seconds (ref 11.4–15.2)

## 2017-11-25 LAB — URINALYSIS, ROUTINE W REFLEX MICROSCOPIC
Bilirubin Urine: NEGATIVE
Glucose, UA: NEGATIVE mg/dL
Hgb urine dipstick: NEGATIVE
Ketones, ur: NEGATIVE mg/dL
Nitrite: NEGATIVE
Protein, ur: NEGATIVE mg/dL
Specific Gravity, Urine: 1.01 (ref 1.005–1.030)
pH: 5 (ref 5.0–8.0)

## 2017-11-25 LAB — SURGICAL PCR SCREEN
MRSA, PCR: NEGATIVE
Staphylococcus aureus: NEGATIVE

## 2017-11-25 LAB — ABO/RH: ABO/RH(D): B POS

## 2017-11-25 LAB — CBC
HCT: 37.9 % (ref 36.0–46.0)
Hemoglobin: 12.5 g/dL (ref 12.0–15.0)
MCH: 30.6 pg (ref 26.0–34.0)
MCHC: 33 g/dL (ref 30.0–36.0)
MCV: 92.9 fL (ref 78.0–100.0)
Platelets: 238 10*3/uL (ref 150–400)
RBC: 4.08 MIL/uL (ref 3.87–5.11)
RDW: 13.5 % (ref 11.5–15.5)
WBC: 4.3 10*3/uL (ref 4.0–10.5)

## 2017-11-25 LAB — APTT: aPTT: 27 seconds (ref 24–36)

## 2017-11-25 LAB — HEMOGLOBIN A1C
Hgb A1c MFr Bld: 5.5 % (ref 4.8–5.6)
Mean Plasma Glucose: 111.15 mg/dL

## 2017-11-25 LAB — TYPE AND SCREEN
ABO/RH(D): B POS
Antibody Screen: NEGATIVE

## 2017-11-25 LAB — BRAIN NATRIURETIC PEPTIDE: B Natriuretic Peptide: 240 pg/mL — ABNORMAL HIGH (ref 0.0–100.0)

## 2017-11-25 NOTE — Progress Notes (Signed)
PCP - Dr. polite Cardiologist - Dr Acie Fredrickson, Dr [name]  Chest x-ray - 11/25/2017  EKG - 11/25/2017  Stress Test - 02/08/17 ECHO - 11/15/17 Cardiac Cath - 01/12/16  Aspirin Instructions: Continue taking Aspirin and Plavix, pt to take both DOS  Patient denies shortness of breath, fever, cough and chest pain at PAT appointment   Patient verbalized understanding of instructions that were given to them at the PAT appointment. Patient was also instructed that they will need to review over the PAT instructions again at home before surgery.

## 2017-11-27 LAB — CUP PACEART REMOTE DEVICE CHECK
Date Time Interrogation Session: 20190321003601
Implantable Pulse Generator Implant Date: 20180720

## 2017-11-29 ENCOUNTER — Ambulatory Visit: Payer: Medicare Other | Admitting: Cardiovascular Disease

## 2017-11-30 ENCOUNTER — Encounter (HOSPITAL_COMMUNITY): Payer: Self-pay | Admitting: *Deleted

## 2017-11-30 ENCOUNTER — Other Ambulatory Visit: Payer: Self-pay

## 2017-11-30 ENCOUNTER — Inpatient Hospital Stay (HOSPITAL_COMMUNITY): Payer: Medicare Other | Admitting: Certified Registered"

## 2017-11-30 ENCOUNTER — Inpatient Hospital Stay (HOSPITAL_COMMUNITY)
Admission: RE | Disposition: A | Discharge status: Home / Self Care | Payer: Medicare Other | Source: Home / Self Care | Attending: Cardiovascular Disease

## 2017-11-30 ENCOUNTER — Inpatient Hospital Stay (HOSPITAL_COMMUNITY): Payer: Medicare Other

## 2017-11-30 ENCOUNTER — Inpatient Hospital Stay (HOSPITAL_COMMUNITY)
Admission: RE | Admit: 2017-11-30 | Discharge: 2017-12-02 | DRG: 229 | Disposition: A | Discharge status: Home / Self Care | Payer: Medicare Other | Attending: Cardiovascular Disease | Admitting: Cardiovascular Disease

## 2017-11-30 DIAGNOSIS — Z79890 Hormone replacement therapy (postmenopausal): Secondary | ICD-10-CM | POA: Diagnosis not present

## 2017-11-30 DIAGNOSIS — Z85528 Personal history of other malignant neoplasm of kidney: Secondary | ICD-10-CM | POA: Diagnosis not present

## 2017-11-30 DIAGNOSIS — Z7902 Long term (current) use of antithrombotics/antiplatelets: Secondary | ICD-10-CM

## 2017-11-30 DIAGNOSIS — Z9889 Other specified postprocedural states: Secondary | ICD-10-CM

## 2017-11-30 DIAGNOSIS — Z95818 Presence of other cardiac implants and grafts: Secondary | ICD-10-CM | POA: Diagnosis not present

## 2017-11-30 DIAGNOSIS — I340 Nonrheumatic mitral (valve) insufficiency: Secondary | ICD-10-CM | POA: Diagnosis present

## 2017-11-30 DIAGNOSIS — R011 Cardiac murmur, unspecified: Secondary | ICD-10-CM | POA: Diagnosis present

## 2017-11-30 DIAGNOSIS — I361 Nonrheumatic tricuspid (valve) insufficiency: Secondary | ICD-10-CM | POA: Diagnosis not present

## 2017-11-30 DIAGNOSIS — Z800 Family history of malignant neoplasm of digestive organs: Secondary | ICD-10-CM

## 2017-11-30 DIAGNOSIS — N183 Chronic kidney disease, stage 3 (moderate): Secondary | ICD-10-CM | POA: Diagnosis present

## 2017-11-30 DIAGNOSIS — E854 Organ-limited amyloidosis: Secondary | ICD-10-CM | POA: Diagnosis present

## 2017-11-30 DIAGNOSIS — K08109 Complete loss of teeth, unspecified cause, unspecified class: Secondary | ICD-10-CM | POA: Diagnosis present

## 2017-11-30 DIAGNOSIS — I9581 Postprocedural hypotension: Secondary | ICD-10-CM | POA: Diagnosis not present

## 2017-11-30 DIAGNOSIS — Z803 Family history of malignant neoplasm of breast: Secondary | ICD-10-CM

## 2017-11-30 DIAGNOSIS — Z905 Acquired absence of kidney: Secondary | ICD-10-CM

## 2017-11-30 DIAGNOSIS — Z79891 Long term (current) use of opiate analgesic: Secondary | ICD-10-CM

## 2017-11-30 DIAGNOSIS — I130 Hypertensive heart and chronic kidney disease with heart failure and stage 1 through stage 4 chronic kidney disease, or unspecified chronic kidney disease: Secondary | ICD-10-CM | POA: Diagnosis present

## 2017-11-30 DIAGNOSIS — Z7982 Long term (current) use of aspirin: Secondary | ICD-10-CM

## 2017-11-30 DIAGNOSIS — Z79899 Other long term (current) drug therapy: Secondary | ICD-10-CM

## 2017-11-30 DIAGNOSIS — Z8742 Personal history of other diseases of the female genital tract: Secondary | ICD-10-CM

## 2017-11-30 DIAGNOSIS — E7800 Pure hypercholesterolemia, unspecified: Secondary | ICD-10-CM | POA: Diagnosis present

## 2017-11-30 DIAGNOSIS — D62 Acute posthemorrhagic anemia: Secondary | ICD-10-CM | POA: Diagnosis not present

## 2017-11-30 DIAGNOSIS — Z006 Encounter for examination for normal comparison and control in clinical research program: Secondary | ICD-10-CM

## 2017-11-30 DIAGNOSIS — R51 Headache: Secondary | ICD-10-CM | POA: Diagnosis present

## 2017-11-30 DIAGNOSIS — Z823 Family history of stroke: Secondary | ICD-10-CM

## 2017-11-30 DIAGNOSIS — Z885 Allergy status to narcotic agent status: Secondary | ICD-10-CM | POA: Diagnosis not present

## 2017-11-30 DIAGNOSIS — I43 Cardiomyopathy in diseases classified elsewhere: Secondary | ICD-10-CM | POA: Diagnosis present

## 2017-11-30 DIAGNOSIS — I5042 Chronic combined systolic (congestive) and diastolic (congestive) heart failure: Secondary | ICD-10-CM | POA: Diagnosis present

## 2017-11-30 DIAGNOSIS — Z8673 Personal history of transient ischemic attack (TIA), and cerebral infarction without residual deficits: Secondary | ICD-10-CM | POA: Diagnosis not present

## 2017-11-30 DIAGNOSIS — Z833 Family history of diabetes mellitus: Secondary | ICD-10-CM

## 2017-11-30 HISTORY — DX: Dyspnea, unspecified: R06.00

## 2017-11-30 HISTORY — PX: MITRAL VALVE REPAIR: SHX2039

## 2017-11-30 HISTORY — PX: MITRAL VALVE REPAIR: CATH118311

## 2017-11-30 LAB — CBC
HCT: 38.2 % (ref 36.0–46.0)
Hemoglobin: 12.8 g/dL (ref 12.0–15.0)
MCH: 31.5 pg (ref 26.0–34.0)
MCHC: 33.5 g/dL (ref 30.0–36.0)
MCV: 94.1 fL (ref 78.0–100.0)
Platelets: 201 10*3/uL (ref 150–400)
RBC: 4.06 MIL/uL (ref 3.87–5.11)
RDW: 14 % (ref 11.5–15.5)
WBC: 10.2 10*3/uL (ref 4.0–10.5)

## 2017-11-30 LAB — CREATININE, SERUM
Creatinine, Ser: 1.29 mg/dL — ABNORMAL HIGH (ref 0.44–1.00)
GFR calc Af Amer: 47 mL/min — ABNORMAL LOW (ref >60)
GFR calc non Af Amer: 40 mL/min — ABNORMAL LOW (ref >60)

## 2017-11-30 LAB — POCT ACTIVATED CLOTTING TIME
Activated Clotting Time: 235 seconds
Activated Clotting Time: 274 seconds
Activated Clotting Time: 263 seconds
Activated Clotting Time: 268 seconds
Activated Clotting Time: 340 seconds
Activated Clotting Time: 125 seconds

## 2017-11-30 LAB — SURGICAL PCR SCREEN
MRSA, PCR: NEGATIVE
Staphylococcus aureus: NEGATIVE

## 2017-11-30 SURGERY — MITRAL VALVE REPAIR
Anesthesia: Monitor Anesthesia Care

## 2017-11-30 MED ORDER — VANCOMYCIN HCL IN DEXTROSE 1-5 GM/200ML-% IV SOLN: 1000.0000 mg | INTRAVENOUS | Status: DC

## 2017-11-30 MED ORDER — LEVOTHYROXINE SODIUM 25 MCG PO TABS
25.0000 ug | ORAL_TABLET | Freq: Every day | ORAL | Status: DC
  Administered 2017-12-01 – 2017-12-02 (×2): 25 ug via ORAL
  Filled 2017-11-30 (×3): qty 1

## 2017-11-30 MED ORDER — FUROSEMIDE 80 MG PO TABS: 80.0000 mg | ORAL_TABLET | Freq: Two times a day (BID) | ORAL | Status: DC

## 2017-11-30 MED ORDER — SODIUM CHLORIDE 0.9 % IV SOLN: INTRAVENOUS | Status: DC

## 2017-11-30 MED ORDER — ONDANSETRON HCL 4 MG/2ML IJ SOLN
INTRAMUSCULAR | Status: AC
  Filled 2017-11-30: qty 2

## 2017-11-30 MED ORDER — ASPIRIN EC 81 MG PO TBEC
81.0000 mg | DELAYED_RELEASE_TABLET | Freq: Every day | ORAL | Status: DC
  Administered 2017-12-01 – 2017-12-02 (×2): 81 mg via ORAL
  Filled 2017-11-30 (×2): qty 1

## 2017-11-30 MED ORDER — HEPARIN SODIUM (PORCINE) 1000 UNIT/ML IJ SOLN
INTRAMUSCULAR | Status: DC | PRN
  Administered 2017-11-30: 5000 [IU] via INTRAVENOUS
  Administered 2017-11-30 (×2): 3000 [IU] via INTRAVENOUS
  Administered 2017-11-30: 7000 [IU] via INTRAVENOUS
  Administered 2017-11-30: 2000 [IU] via INTRAVENOUS

## 2017-11-30 MED ORDER — HEPARIN SODIUM (PORCINE) 1000 UNIT/ML IJ SOLN
INTRAMUSCULAR | Status: AC
  Filled 2017-11-30: qty 1

## 2017-11-30 MED ORDER — PHENYLEPHRINE HCL 10 MG/ML IJ SOLN
INTRAVENOUS | Status: DC | PRN
  Administered 2017-11-30: 30 ug/min via INTRAVENOUS
  Administered 2017-11-30: 11:00:00 via INTRAVENOUS

## 2017-11-30 MED ORDER — FENTANYL CITRATE (PF) 100 MCG/2ML IJ SOLN
INTRAMUSCULAR | Status: DC | PRN
  Administered 2017-11-30: 50 ug via INTRAVENOUS

## 2017-11-30 MED ORDER — PHENYLEPHRINE HCL 10 MG/ML IJ SOLN
INTRAMUSCULAR | Status: DC | PRN
  Administered 2017-11-30: 200 ug via INTRAVENOUS
  Administered 2017-11-30: 80 ug via INTRAVENOUS
  Administered 2017-11-30: 120 ug via INTRAVENOUS
  Administered 2017-11-30: 80 ug via INTRAVENOUS
  Administered 2017-11-30: 160 ug via INTRAVENOUS
  Administered 2017-11-30: 40 ug via INTRAVENOUS
  Administered 2017-11-30: 160 ug via INTRAVENOUS

## 2017-11-30 MED ORDER — SODIUM CHLORIDE 0.45 % IV SOLN: INTRAVENOUS | Status: DC

## 2017-11-30 MED ORDER — PRAVASTATIN SODIUM 40 MG PO TABS
80.0000 mg | ORAL_TABLET | Freq: Every day | ORAL | Status: DC
  Administered 2017-11-30 – 2017-12-01 (×2): 80 mg via ORAL
  Filled 2017-11-30 (×3): qty 2

## 2017-11-30 MED ORDER — LACTATED RINGERS IV SOLN
INTRAVENOUS | Status: DC | PRN
  Administered 2017-11-30 (×2): via INTRAVENOUS

## 2017-11-30 MED ORDER — SODIUM CHLORIDE 0.9% FLUSH
3.0000 mL | Freq: Two times a day (BID) | INTRAVENOUS | Status: DC
  Administered 2017-12-01 (×2): 3 mL via INTRAVENOUS

## 2017-11-30 MED ORDER — PROPOFOL 10 MG/ML IV BOLUS
INTRAVENOUS | Status: DC | PRN
  Administered 2017-11-30: 120 mg via INTRAVENOUS

## 2017-11-30 MED ORDER — SODIUM CHLORIDE 0.9 % IV BOLUS: 250.0000 mL | Freq: Once | INTRAVENOUS | Status: DC

## 2017-11-30 MED ORDER — SODIUM CHLORIDE 0.9 % IV SOLN: 250.0000 mL | INTRAVENOUS | Status: DC | PRN

## 2017-11-30 MED ORDER — ROCURONIUM BROMIDE 100 MG/10ML IV SOLN
INTRAVENOUS | Status: DC | PRN
  Administered 2017-11-30: 50 mg via INTRAVENOUS
  Administered 2017-11-30 (×2): 20 mg via INTRAVENOUS
  Administered 2017-11-30: 50 mg via INTRAVENOUS

## 2017-11-30 MED ORDER — CLOPIDOGREL BISULFATE 75 MG PO TABS
75.0000 mg | ORAL_TABLET | Freq: Every day | ORAL | Status: DC
  Administered 2017-12-01 – 2017-12-02 (×2): 75 mg via ORAL
  Filled 2017-11-30 (×2): qty 1

## 2017-11-30 MED ORDER — ONDANSETRON HCL 4 MG/2ML IJ SOLN
4.0000 mg | Freq: Four times a day (QID) | INTRAMUSCULAR | Status: DC | PRN
  Administered 2017-11-30: 4 mg via INTRAVENOUS

## 2017-11-30 MED ORDER — BUTALBITAL-APAP-CAFFEINE 50-325-40 MG PO TABS: 1.0000 | ORAL_TABLET | Freq: Two times a day (BID) | ORAL | Status: DC | PRN

## 2017-11-30 MED ORDER — FENTANYL CITRATE (PF) 100 MCG/2ML IJ SOLN: 25.0000 ug | INTRAMUSCULAR | Status: DC | PRN

## 2017-11-30 MED ORDER — CHLORHEXIDINE GLUCONATE 4 % EX LIQD: 30.0000 mL | CUTANEOUS | Status: DC

## 2017-11-30 MED ORDER — CARVEDILOL 6.25 MG PO TABS
6.2500 mg | ORAL_TABLET | Freq: Two times a day (BID) | ORAL | Status: DC
  Administered 2017-12-02: 6.25 mg via ORAL
  Filled 2017-11-30 (×2): qty 1

## 2017-11-30 MED ORDER — SUGAMMADEX SODIUM 200 MG/2ML IV SOLN
INTRAVENOUS | Status: DC | PRN
  Administered 2017-11-30: 140 mg via INTRAVENOUS

## 2017-11-30 MED ORDER — ACETAMINOPHEN 500 MG PO TABS
1000.0000 mg | ORAL_TABLET | Freq: Four times a day (QID) | ORAL | Status: DC | PRN
  Administered 2017-11-30 – 2017-12-02 (×3): 1000 mg via ORAL
  Filled 2017-11-30 (×3): qty 2

## 2017-11-30 MED ORDER — EPHEDRINE SULFATE 50 MG/ML IJ SOLN
INTRAMUSCULAR | Status: DC | PRN
  Administered 2017-11-30: 20 mg via INTRAVENOUS

## 2017-11-30 MED ORDER — SODIUM CHLORIDE 0.9 % IV SOLN
INTRAVENOUS | Status: DC | PRN
  Administered 2017-11-30: 10:00:00 via INTRAVENOUS

## 2017-11-30 MED ORDER — VANCOMYCIN HCL IN DEXTROSE 1-5 GM/200ML-% IV SOLN
INTRAVENOUS | Status: AC
  Filled 2017-11-30: qty 200

## 2017-11-30 MED ORDER — LIDOCAINE HCL (CARDIAC) PF 100 MG/5ML IV SOSY
PREFILLED_SYRINGE | INTRAVENOUS | Status: DC | PRN
  Administered 2017-11-30: 30 mg via INTRAVENOUS

## 2017-11-30 MED ORDER — ENSURE ENLIVE PO LIQD
237.0000 mL | Freq: Two times a day (BID) | ORAL | Status: DC
  Administered 2017-12-01 (×2): 237 mL via ORAL

## 2017-11-30 MED ORDER — SODIUM CHLORIDE 0.9 % IV BOLUS
250.0000 mL | Freq: Once | INTRAVENOUS | Status: AC
  Administered 2017-11-30: 250 mL via INTRAVENOUS

## 2017-11-30 MED ORDER — HEPARIN SODIUM (PORCINE) 5000 UNIT/ML IJ SOLN
5000.0000 [IU] | Freq: Three times a day (TID) | INTRAMUSCULAR | Status: DC
  Administered 2017-12-01 – 2017-12-02 (×4): 5000 [IU] via SUBCUTANEOUS
  Filled 2017-11-30 (×3): qty 1

## 2017-11-30 MED ORDER — POTASSIUM CHLORIDE CRYS ER 20 MEQ PO TBCR
20.0000 meq | EXTENDED_RELEASE_TABLET | Freq: Two times a day (BID) | ORAL | Status: DC
Start: 2017-11-30 — End: 2017-12-02
  Administered 2017-11-30 – 2017-12-02 (×4): 20 meq via ORAL
  Filled 2017-11-30 (×4): qty 1

## 2017-11-30 MED ORDER — SODIUM CHLORIDE 0.9% FLUSH: 3.0000 mL | INTRAVENOUS | Status: DC | PRN

## 2017-11-30 MED ORDER — PROTAMINE SULFATE 10 MG/ML IV SOLN
INTRAVENOUS | Status: DC | PRN
  Administered 2017-11-30: 50 mg via INTRAVENOUS

## 2017-11-30 MED ORDER — ONDANSETRON HCL 4 MG/2ML IJ SOLN: 4.0000 mg | Freq: Once | INTRAMUSCULAR | Status: DC | PRN

## 2017-11-30 SURGICAL SUPPLY — 17 items
CATHETER STEERABLE GUIDE (CATHETERS) ×2 IMPLANT
CLIP MITRACLIP NTR (Prosthesis & Implant Heart) ×4 IMPLANT
COVER PRB 48X5XTLSCP FOLD TPE (BAG) ×1 IMPLANT
COVER PROBE 5X48 (BAG) ×1
DEVICE CLOSURE PERCLS PRGLD 6F (VASCULAR PRODUCTS) ×2 IMPLANT
KIT DILATOR VASC 18G NDL (KITS) ×2 IMPLANT
KIT HEART LEFT (KITS) ×2 IMPLANT
KIT MICROINTRODUCER 5F 7206 (SHEATH) ×2 IMPLANT
MITRACLIP 1 SGC 2 NTR MBR0102 (Prosthesis & Implant Heart) ×2 IMPLANT
NEEDLE BAYLIS TRANSSEPTAL 71CM (NEEDLE) ×2 IMPLANT
PACK CARDIAC CATHETERIZATION (CUSTOM PROCEDURE TRAY) ×2 IMPLANT
PERCLOSE PROGLIDE 6F (VASCULAR PRODUCTS) ×4
SHEATH AVANTI 11CM 8FR (SHEATH) ×2 IMPLANT
SHEATH SWARTZ TS SL2 63CM 8.5F (SHEATH) ×2 IMPLANT
STOPCOCK MORSE 400PSI 3WAY (MISCELLANEOUS) ×10 IMPLANT
TRANSDUCER W/STOPCOCK (MISCELLANEOUS) ×4 IMPLANT
WIRE AMPLATZ WHISKJ .035X260CM (WIRE) ×2 IMPLANT

## 2017-11-30 NOTE — Progress Notes (Signed)
mitra clip card plced on paper chart

## 2017-11-30 NOTE — Progress Notes (Signed)
bp 88/50 (62) pt. C/o dizziness when transfer chair to bed.  Right groin site level 0.  Paged Dr. Teena Dunk, awaiting orders.

## 2017-11-30 NOTE — Progress Notes (Signed)
Site area: left radial arterial line Site Prior to Removal:  Level 0 Pressure Applied For: 10 minutes Manual:   yes Patient Status During Pull:  stable Post Pull Site:  Level 0 Post Pull Instructions Given:   Post Pull Pulses Present: left radial palpable Dressing Applied:  Gauze and tegaderm Bedrest begins @  Comments:

## 2017-11-30 NOTE — Progress Notes (Signed)
Spoke with Dr Therisa Doyne.  Will keep pt on br for now and continue to monitor.

## 2017-11-30 NOTE — Op Note (Signed)
[facility]ENTER   MULTIDISCIPLINARY HEART VALVE TEAM  Date of Procedure:  11/30/2017  Preoperative Diagnosis: Severe Symptomatic Mitral Regurgitation (Stage D)  Postoperative Diagnosis: Same   Procedure Performed: . Ultrasound-guided right transfemoral venous access . Double PreClose right femoral vein . Transseptal puncture using Bailess RF needle . Mitral valve repair with MitraClip NTR x 2   Co-Surgeons: Blane Ohara, MD and Rexene Alberts, MD  Echocardiographer: Dr Johnsie Cancel  Anesthesiologist: Dr Linna Caprice  Device Implant: Mitraclip NTR x 2 (lot numbers [id number] and [id number])  Procedural Indication: . Severe Primary Mitral Regurgitation (Stage D)   Brief History: . This is a 72 year old woman with severe mitral regurgitation secondary to leaflet thickening, leaflet restriction, with a  severe central MR jet.  The patient is followed for severe diastolic heart failure and amyloid heart disease.  She has had multiple hospitalizations for congestive heart failure exacerbation.  She was evaluated by cardiac surgery and felt to be a  poor candidate for conventional heart surgery because of her severe comorbid conditions.  She is felt to be an appropriate candidate for MitraClip repair of the mitral valve, with severe stage D1 symptomatic mitral regurgitation.  Echo Findings: . Preop:  o Normal LV systolic function with preserved LVEF o Severe Primary MR secondary to leaflet thickening, restricted motion, Grade 4 . Post-op:  o Unchanged LV systolic function o Mild residual MR  Procedural Details: Prep . The patient is brought to the cardiac catheterization lab in the fasting state. General anesthesia is induced. The patient is prepped from the groin to chin. A foley catheter is placed. Hemodynamics are monitored via a radial artery line.   Venous Access . Using ultrasound guidance, the right femoral vein is punctured. Ultrasound images are captured and stored in the  patient's chart. The vein is dilated and 2 Perclose devices are deplyed at 10' and 2' positions to 'Preclose' the femoral vein. An 8 Fr sheath is inserted. . Heparin 2000 units is administered  Transseptal Puncture . A 0.032" J-wire is advanced into the SVC  . An SL-2 sheath and dilator is advanced into the SVC, and a Bayliss RF needle is then inserted into the dilator which is re-advanced to the tip of the sheath . The transseptal sheath is retracted into the RA under fluoroscopic and echo guidance to obtain position on the posterior fossa where echo measurements are made to assure appropriate access to the mitral valve. Once proper position is confirmed by echo, RF energy is delivered and the [place] needle is advanced into the LA. The dilator and SL-2 sheath is advanced over the needle where proper position is confirmed by echo  . Weight based IV heparin is administered and a therapeutic ACT > 250 is confirmed  Steerable Guide Catheter Insertion . An Amplatzer superstiff wire is advanced into the LUPV . The femoral vein is progressively dilated and the 24 Fr Steerable guide catheter is inserted and then directed across the interatrial septum over the amplatzer wire. Position is confirmed approximately 3 cm into the left atrium . The guide is de-aired   MitraClip Insertion . The MitraClip NTR is prepped per protocol and inserted via the introducer into the steerable guide catheter . The Clip Delivery System (CDS) is advanced under fluoro and echo guidance so that the sleeve markers are evenly spaced on each side of the guide marker  MitraClip Positioning in the Left Atrium (Supravalvular Alignment) . M-knob is applied to bring the Clip towards the mitral  valve. Echo guidance is used to avoid contact with LA structures. . Low tidal volume respiration is initiated . The Clip arms are opened to 180 degrees . 2D and 3D TEE imaging is performed in multiple planes and the Clip is positioned and  aligned above the valve using standard steering techniques   Entry into the Left Ventricle and Mitral Valve Leaflet Grasp . The Clip is advanced across the mitral valve into the LV, maintaining proper orientation . The Clip arms are opened further and the Clip is slowly retracted  . Capture of both the anterior and posterior leaflets are visualized by echo and the grippers are dropped  MitraClip Deployment . After extensive echo evaluation, there is good leaflet capture of both the anterior and posterior leaflets . Following standard protocol, the MitraClip device is deployed, gripper line and suture are removed  Device Removal . The clip delivery system is removed under echo guidance . Echo assessment demonstrates 2-3+ residual MR adjacent to the Clip and we elected to place a second clip.   2nd Clip Deployment  The Clip delivery system is again prepped per protocol and introduced into the guide sheath  The clip is positioned just lateral to the first clip and advanced from the LA into the LV   Once leaflet capture is obtained, the grippers are dropped and echo is used to demonstrate good leaflet capture  The second clip is released per protocol with a good result and only 1+ residual MR  Guide Removal . The steerable guide catheter is retracted into the right atrium and the interatrial septum is assessed by echo without evidence of right-to-left shunting or large ASD  Hemostasis The guide catheter is removed over a 0.035" wire and the Perclose sutures are tightened. There was not good hemostasis with Perclose and manual pressure was held for complete hemostasis. Protamine was administered  Estimated blood loss: 50-100 cc  There are no immediate procedural complications. The patient is transferred to the post-procedure recovery area in stable condition.   Sherren Mocha 11/30/2017 1:52 PM

## 2017-11-30 NOTE — Progress Notes (Signed)
Rt groin site dressed by Sherlyn Lick. Level 0

## 2017-11-30 NOTE — Anesthesia Postprocedure Evaluation (Signed)
Anesthesia Post Note  Patient: Katie [name]  Procedure(s) Performed: MITRAL VALVE REPAIR (N/A )     Patient location during evaluation: Cath Lab Anesthesia Type: General Level of consciousness: awake and alert Pain management: pain level controlled Vital Signs Assessment: post-procedure vital signs reviewed and stable Respiratory status: spontaneous breathing, nonlabored ventilation, respiratory function stable and patient connected to nasal cannula oxygen Cardiovascular status: blood pressure returned to baseline and stable Postop Assessment: no apparent nausea or vomiting Anesthetic complications: no    Last Vitals:  Vitals:   11/30/17 1441 11/30/17 1700  BP: 93/62   Pulse:    Resp:    Temp:  36.7 C  SpO2:      Last Pain:  Vitals:   11/30/17 1700  TempSrc: Oral  PainSc:                  Shaqueena Mauceri COKER

## 2017-11-30 NOTE — Anesthesia Procedure Notes (Signed)
Arterial Line Insertion Start/End5/08/2017 7:45 AM, 11/30/2017 8:05 AM Performed by: Roberts Gaudy, MD, Lance Coon, CRNA  Patient location: Pre-op. Preanesthetic checklist: patient identified, IV checked, site marked, risks and benefits discussed, surgical consent, monitors and equipment checked, pre-op evaluation, timeout performed and anesthesia consent Lidocaine 1% used for infiltration Left, radial was placed Catheter size: 20 G Hand hygiene performed  and maximum sterile barriers used   Attempts: 4 Procedure performed using ultrasound guided technique. Ultrasound Notes:anatomy identified Following insertion, dressing applied and Biopatch. Post procedure assessment: normal  Patient tolerated the procedure well with no immediate complications.

## 2017-11-30 NOTE — Interval H&P Note (Signed)
History and Physical Interval Note:  11/30/2017 7:59 AM  Katie [name]  has presented today for surgery, with the diagnosis of Severe Mitral Regurgitation  The various methods of treatment have been discussed with the patient and family. After consideration of risks, benefits and other options for treatment, the patient has consented to  Procedure(s): MITRAL VALVE REPAIR (N/A) as a surgical intervention .  The patient's history has been reviewed, patient examined, no change in status, stable for surgery.  I have reviewed the patient's chart and labs.  Questions were answered to the patient's satisfaction.    Pt seen in Short Stay area. REports no new symptoms. Discussed procedure at length with her daughter. All questions answered.   Sherren Mocha

## 2017-11-30 NOTE — Progress Notes (Signed)
Nauseated; heaving. Spitting up thick blood tinged to clear mucous. Medicated for nausea. Rt groin level 0

## 2017-11-30 NOTE — Significant Event (Addendum)
Rapid Response Event Note  Called by RN about patient being hypotensive and MD ordering a push of Neo-Synephrine IV.  I informed the RN that I was with a patient in a medical emergency and that RNs cannot administer IV push pressor doses at the bedside unless an MD is present  RN is administering a NS bolus 250cc for now. I informed her that I would come as soon I could. RN called me back at 2307 and informed that MD just wants fluid bolus for now and it can repeated if needed.   Call 2245 End Time 2310  Katie [name]

## 2017-11-30 NOTE — Progress Notes (Signed)
  Echocardiogram Echocardiogram Transesophageal has been performed.  Bobbye Charleston 11/30/2017, 12:59 PM

## 2017-11-30 NOTE — Anesthesia Procedure Notes (Signed)
Procedure Name: Intubation Date/Time: 11/30/2017 8:36 AM Performed by: Lance Coon, CRNA Pre-anesthesia Checklist: Patient identified, Emergency Drugs available, Suction available, Patient being monitored and Timeout performed Patient Re-evaluated:Patient Re-evaluated prior to induction Oxygen Delivery Method: Circle system utilized Preoxygenation: Pre-oxygenation with 100% oxygen Induction Type: IV induction Ventilation: Mask ventilation without difficulty and Oral airway inserted - appropriate to patient size Laryngoscope Size: Miller and 3 Grade View: Grade I Tube type: Oral Tube size: 7.0 mm Number of attempts: 1 Airway Equipment and Method: Stylet Placement Confirmation: ETT inserted through vocal cords under direct vision,  positive ETCO2 and breath sounds checked- equal and bilateral Secured at: 21 cm Tube secured with: Tape Dental Injury: Teeth and Oropharynx as per pre-operative assessment

## 2017-11-30 NOTE — Transfer of Care (Signed)
Immediate Anesthesia Transfer of Care Note  Patient: NIYLA MARONE  Procedure(s) Performed: MITRAL VALVE REPAIR (N/A )  Patient Location: Cath Lab  Anesthesia Type:General  Level of Consciousness: awake and patient cooperative  Airway & Oxygen Therapy: Patient Spontanous Breathing and Patient connected to nasal cannula oxygen  Post-op Assessment: Report given to RN and Post -op Vital signs reviewed and stable  Post vital signs: Reviewed and stable  Last Vitals:  Vitals Value Taken Time  BP 100/52 11/30/2017  1:05 PM  Temp 36.2 C 11/30/2017  1:00 PM  Pulse 81 11/30/2017  1:08 PM  Resp 12 11/30/2017  1:08 PM  SpO2 98 % 11/30/2017  1:08 PM  Vitals shown include unvalidated device data.  Last Pain:  Vitals:   11/30/17 1300  TempSrc: Temporal  PainSc: 0-No pain      Patients Stated Pain Goal: 3 (85/50/15 8682)  Complications: No apparent anesthesia complications

## 2017-11-30 NOTE — Progress Notes (Signed)
BP remains low, last 88/53 (64), pt c/o dizziness.  Groin site level 0. Dr. Therisa Doyne paged, ordered Neo push and NS bolus 250cc.  Dr. Arville Go aware that neo cannot be given on step down.   Dr. Burt Knack paged and made aware of findings.  250cc NS bolus ordered.  May repeat x1.  No need for neo push or CL.  Will continue to monitor.

## 2017-11-30 NOTE — Anesthesia Preprocedure Evaluation (Signed)
Anesthesia Evaluation  Patient identified by MRN, date of birth, ID band Patient awake    Reviewed: Allergy & Precautions, NPO status , Patient's Chart, lab work & pertinent test results  Airway Mallampati: II  TM Distance: >3 FB Neck ROM: Full    Dental  (+) Edentulous Upper, Edentulous Lower   Pulmonary    breath sounds clear to auscultation       Cardiovascular hypertension,  Rhythm:Regular Rate:Normal + Systolic murmurs    Neuro/Psych    GI/Hepatic   Endo/Other    Renal/GU      Musculoskeletal   Abdominal   Peds  Hematology   Anesthesia Other Findings   Reproductive/Obstetrics                             Anesthesia Physical Anesthesia Plan  ASA: III  Anesthesia Plan: General   Post-op Pain Management:    Induction: Intravenous  PONV Risk Score and Plan: Ondansetron and Dexamethasone  Airway Management Planned: Oral ETT  Additional Equipment: Arterial line  Intra-op Plan:   Post-operative Plan: Extubation in OR  Informed Consent: I have reviewed the patients History and Physical, chart, labs and discussed the procedure including the risks, benefits and alternatives for the proposed anesthesia with the patient or authorized representative who has indicated his/her understanding and acceptance.     Plan Discussed with: CRNA and Anesthesiologist  Anesthesia Plan Comments:         Anesthesia Quick Evaluation

## 2017-12-01 ENCOUNTER — Inpatient Hospital Stay (HOSPITAL_COMMUNITY): Payer: Medicare Other

## 2017-12-01 DIAGNOSIS — I340 Nonrheumatic mitral (valve) insufficiency: Principal | ICD-10-CM

## 2017-12-01 DIAGNOSIS — I361 Nonrheumatic tricuspid (valve) insufficiency: Secondary | ICD-10-CM

## 2017-12-01 LAB — CBC
HCT: 32.3 % — ABNORMAL LOW (ref 36.0–46.0)
Hemoglobin: 10.4 g/dL — ABNORMAL LOW (ref 12.0–15.0)
MCH: 30.3 pg (ref 26.0–34.0)
MCHC: 32.2 g/dL (ref 30.0–36.0)
MCV: 94.2 fL (ref 78.0–100.0)
Platelets: 202 10*3/uL (ref 150–400)
RBC: 3.43 MIL/uL — ABNORMAL LOW (ref 3.87–5.11)
RDW: 13.9 % (ref 11.5–15.5)
WBC: 5.6 10*3/uL (ref 4.0–10.5)

## 2017-12-01 LAB — ECHOCARDIOGRAM COMPLETE
Height: 63 in
Weight: 2336 oz

## 2017-12-01 LAB — BASIC METABOLIC PANEL
Anion gap: 6 (ref 5–15)
BUN: 21 mg/dL — ABNORMAL HIGH (ref 6–20)
CO2: 27 mmol/L (ref 22–32)
Calcium: 8.5 mg/dL — ABNORMAL LOW (ref 8.9–10.3)
Chloride: 108 mmol/L (ref 101–111)
Creatinine, Ser: 1.4 mg/dL — ABNORMAL HIGH (ref 0.44–1.00)
GFR calc Af Amer: 42 mL/min — ABNORMAL LOW (ref >60)
GFR calc non Af Amer: 37 mL/min — ABNORMAL LOW (ref >60)
Glucose, Bld: 93 mg/dL (ref 65–99)
Potassium: 4.1 mmol/L (ref 3.5–5.1)
Sodium: 141 mmol/L (ref 135–145)

## 2017-12-01 NOTE — Progress Notes (Signed)
Progress Note  Patient Name: Katie [name] Date of Encounter: 12/01/2017  Primary Cardiologist: Loralie Champagne, MD   Subjective   Reports a period of shortness of breath this morning but feels fine now.  She has had some dizziness.  Mild hypotension with systolic blood pressure in the 80s and mean arterial pressure 55 to 60 mmHg overnight.  The patient has had some dizziness.  She denies any chest pain.  Inpatient Medications    Scheduled Meds: . aspirin EC  81 mg Oral Daily  . carvedilol  6.25 mg Oral BID  . clopidogrel  75 mg Oral Daily  . feeding supplement (ENSURE ENLIVE)  237 mL Oral BID BM  . furosemide  80 mg Oral BID  . heparin injection (subcutaneous)  5,000 Units Subcutaneous Q8H  . levothyroxine  25 mcg Oral QAC breakfast  . potassium chloride SA  20 mEq Oral BID  . pravastatin  80 mg Oral QHS  . sodium chloride flush  3 mL Intravenous Q12H   Continuous Infusions: . sodium chloride    . sodium chloride 483.9 mL/hr at 11/30/17 1407   PRN Meds: sodium chloride, acetaminophen, butalbital-acetaminophen-caffeine, fentaNYL (SUBLIMAZE) injection, ondansetron (ZOFRAN) IV, ondansetron (ZOFRAN) IV, sodium chloride flush   Vital Signs    Vitals:   12/01/17 0500 12/01/17 0530 12/01/17 0600 12/01/17 0630  BP: (!) 93/55 (!) 87/48 (!) 87/49 (!) 95/53  Pulse: 81 75 77 74  Resp: (!) 33 18 (!) 9 (!) 0  Temp:      TempSrc:      SpO2: 95% 97% 97% 97%  Weight:      Height:        Intake/Output Summary (Last 24 hours) at 12/01/2017 0736 Last data filed at 12/01/2017 0045 Gross per 24 hour  Intake 2560 ml  Output 1110 ml  Net 1450 ml   Filed Weights   11/30/17 0642  Weight: 146 lb (66.2 kg)    Telemetry    Sinus rhythm - Personally Reviewed  ECG    NSR, low voltage, nonspecific ST abnormality - Personally Reviewed  Physical Exam  Alert, oriented woman in no distress GEN: No acute distress.   Neck:  Mild JVD Cardiac: RRR, no murmurs, rubs, or gallops.    Respiratory: Clear to auscultation bilaterally. GI: Soft, nontender, non-distended  MS: No edema; No deformity. Neuro:  Nonfocal  Psych: Normal affect   Labs    Chemistry Recent Labs  Lab 11/25/17 1313 11/30/17 1458 12/01/17 0248  NA 140  --  141  K 3.9  --  4.1  CL 106  --  108  CO2 23  --  27  GLUCOSE 113*  --  93  BUN 23*  --  21*  CREATININE 1.33* 1.29* 1.40*  CALCIUM 9.5  --  8.5*  PROT 7.2  --   --   ALBUMIN 3.7  --   --   AST 22  --   --   ALT 17  --   --   ALKPHOS 130*  --   --   BILITOT 0.7  --   --   GFRNONAA 39* 40* 37*  GFRAA 45* 47* 42*  ANIONGAP 11  --  6     Hematology Recent Labs  Lab 11/25/17 1313 11/30/17 1458  WBC 4.3 10.2  RBC 4.08 4.06  HGB 12.5 12.8  HCT 37.9 38.2  MCV 92.9 94.1  MCH 30.6 31.5  MCHC 33.0 33.5  RDW 13.5 14.0  PLT 238  201    Cardiac EnzymesNo results for input(s): TROPONINI in the last 168 hours. No results for input(s): TROPIPOC in the last 168 hours.   BNP Recent Labs  Lab 11/25/17 1314  BNP 240.0*     DDimer No results for input(s): DDIMER in the last 168 hours.   Radiology    Dg Chest Port 1 View  Result Date: 11/30/2017 CLINICAL DATA:  Mitral regurgitation EXAM: PORTABLE CHEST 1 VIEW COMPARISON:  11/25/2017 FINDINGS: The heart is moderately enlarged. Normal vascularity. Mild subsegmental atelectasis at the lung bases. No pneumothorax or pleural effusion. IMPRESSION: Cardiomegaly without decompensation. Electronically Signed   By: Marybelle Killings M.D.   On: 11/30/2017 17:15    Cardiac Studies   POD #1 echo pending  Patient Profile     72 y.o. female with chronic diastolic CHF and severe mitral regurgitation presenting 5/1 for mitral valve repair with percutaneous MitraClip  Assessment & Plan    1. Severe mitral regurgitation, Stage D, POD #1 from MitraClip - treated with 2 clips with good reduction in MR. POD #1 echo today.   2.  Hypotension: Unclear etiology.  There is no tachycardia or chest pain.   Will check a CBC this morning to make sure she did not have significant blood loss and subsequent postoperative anemia.  Her groin site appears clear and there is no evidence of active bleeding.  Will hold carvedilol and furosemide this morning.  Follow-up echo as above.  3.  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  Mild volume excess noted today, but going to hold diuretics because I am concerned about her low blood pressure.  Hold carvedilol.  4.  Chronic kidney disease, stage III: Creatinine is stable.  Disposition: Keep in hospital today with relatively low blood pressure.  Follow-up echocardiogram, follow-up CBC, try to mobilize.  For questions or updates, please contact [place] Please consult [email or web address] for contact info under Cardiology/STEMI.      Signed, Sherren Mocha, MD  12/01/2017, 7:36 AM

## 2017-12-01 NOTE — Progress Notes (Signed)
  Echocardiogram 2D Echocardiogram has been performed.  Katie [name] 12/01/2017, 12:10 PM

## 2017-12-01 NOTE — Progress Notes (Signed)
CARDIAC REHAB PHASE I   PRE:  Rate/Rhythm: 80 SR  BP:  Supine:   Sitting: 99/68  Standing:    SaO2: 100%RA  MODE:  Ambulation: 340 ft   POST:  Rate/Rhythm: 104  BP:  Supine:   Sitting: 94/61  Standing:    SaO2: 100%RA 1330-1410 Pt assisted to [facility] and then we walked 340 ft on RA with rolling walker. Pt tolerated well. Stated she was a little lightheaded but not bad. Pt unsure if she wants rolling walker for home use. Will decide. To recliner after walk with call bell.   Graylon Good, RN BSN  12/01/2017 2:08 PM

## 2017-12-02 ENCOUNTER — Other Ambulatory Visit: Payer: Self-pay

## 2017-12-02 DIAGNOSIS — Z9889 Other specified postprocedural states: Secondary | ICD-10-CM

## 2017-12-02 DIAGNOSIS — I340 Nonrheumatic mitral (valve) insufficiency: Secondary | ICD-10-CM

## 2017-12-02 MED ORDER — CARVEDILOL 3.125 MG PO TABS: 3.1250 mg | ORAL_TABLET | Freq: Two times a day (BID) | ORAL | Status: DC

## 2017-12-02 MED ORDER — FUROSEMIDE 80 MG PO TABS: 80.0000 mg | ORAL_TABLET | Freq: Two times a day (BID) | ORAL | Status: DC

## 2017-12-02 MED ORDER — CARVEDILOL 6.25 MG PO TABS: 3.1250 mg | ORAL_TABLET | Freq: Two times a day (BID) | ORAL | 0 refills | Status: DC

## 2017-12-02 NOTE — Progress Notes (Signed)
Progress Note  Patient Name: Katie [name] Date of Encounter: 12/02/2017  Primary Cardiologist: Loralie Champagne, MD   Subjective   Feels well this morning.  Denies chest pain or dyspnea.  Dizziness has resolved.  Inpatient Medications    Scheduled Meds: . aspirin EC  81 mg Oral Daily  . carvedilol  3.125 mg Oral BID  . clopidogrel  75 mg Oral Daily  . feeding supplement (ENSURE ENLIVE)  237 mL Oral BID BM  . furosemide  80 mg Oral BID  . heparin injection (subcutaneous)  5,000 Units Subcutaneous Q8H  . levothyroxine  25 mcg Oral QAC breakfast  . potassium chloride SA  20 mEq Oral BID  . pravastatin  80 mg Oral QHS  . sodium chloride flush  3 mL Intravenous Q12H   Continuous Infusions: . sodium chloride    . sodium chloride 483.9 mL/hr at 11/30/17 1407   PRN Meds: sodium chloride, acetaminophen, butalbital-acetaminophen-caffeine, fentaNYL (SUBLIMAZE) injection, ondansetron (ZOFRAN) IV, ondansetron (ZOFRAN) IV, sodium chloride flush   Vital Signs    Vitals:   12/01/17 1927 12/01/17 1930 12/01/17 2336 12/02/17 0740  BP: (!) 98/55 107/67 107/72 115/67  Pulse: 79 81 78 72  Resp: 18 17 16  (!) 22  Temp: 97.9 F (36.6 C)  98.3 F (36.8 C) 98.2 F (36.8 C)  TempSrc: Oral  Oral Oral  SpO2: 98% 99% 97% 100%  Weight:      Height:        Intake/Output Summary (Last 24 hours) at 12/02/2017 5176 Last data filed at 12/02/2017 0700 Gross per 24 hour  Intake 240 ml  Output 375 ml  Net -135 ml   Filed Weights   11/30/17 0642  Weight: 146 lb (66.2 kg)    Telemetry    Normal sinus rhythm - Personally Reviewed   Physical Exam  Alert oriented woman in no distress GEN: No acute distress.   Neck: No JVD Cardiac: RRR, no murmurs, rubs, or gallops.  Respiratory: Clear to auscultation bilaterally. GI: Soft, nontender, non-distended  MS: No edema; No deformity.  Right groin site is clear, bandages removed this morning Neuro:  Nonfocal  Psych: Normal affect   Labs      Chemistry Recent Labs  Lab 11/25/17 1313 11/30/17 1458 12/01/17 0248  NA 140  --  141  K 3.9  --  4.1  CL 106  --  108  CO2 23  --  27  GLUCOSE 113*  --  93  BUN 23*  --  21*  CREATININE 1.33* 1.29* 1.40*  CALCIUM 9.5  --  8.5*  PROT 7.2  --   --   ALBUMIN 3.7  --   --   AST 22  --   --   ALT 17  --   --   ALKPHOS 130*  --   --   BILITOT 0.7  --   --   GFRNONAA 39* 40* 37*  GFRAA 45* 47* 42*  ANIONGAP 11  --  6     Hematology Recent Labs  Lab 11/25/17 1313 11/30/17 1458 12/01/17 0827  WBC 4.3 10.2 5.6  RBC 4.08 4.06 3.43*  HGB 12.5 12.8 10.4*  HCT 37.9 38.2 32.3*  MCV 92.9 94.1 94.2  MCH 30.6 31.5 30.3  MCHC 33.0 33.5 32.2  RDW 13.5 14.0 13.9  PLT 238 201 202    Cardiac EnzymesNo results for input(s): TROPONINI in the last 168 hours. No results for input(s): TROPIPOC in the last 168 hours.  BNP Recent Labs  Lab 11/25/17 1314  BNP 240.0*     DDimer No results for input(s): DDIMER in the last 168 hours.   Radiology    Dg Chest Port 1 View  Result Date: 11/30/2017 CLINICAL DATA:  Mitral regurgitation EXAM: PORTABLE CHEST 1 VIEW COMPARISON:  11/25/2017 FINDINGS: The heart is moderately enlarged. Normal vascularity. Mild subsegmental atelectasis at the lung bases. No pneumothorax or pleural effusion. IMPRESSION: Cardiomegaly without decompensation. Electronically Signed   By: Marybelle Killings M.D.   On: 11/30/2017 17:15    Cardiac Studies   2D echocardiogram 12/01/2017 (postoperative day #1 study) Study Conclusions  - Left ventricle: The cavity size was normal. Wall thickness was   increased in a pattern of moderate LVH. There was severe focal   basal hypertrophy of the septum. 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 Systolic   function was normal. The estimated ejection fraction was in the   range of 55% to 60%. LV mid ventricle false tendon. Diffuse   hypokinesis. The study is not technically sufficient to  allow   evaluation of LV diastolic function. - Aortic valve: Trileaflet. Sclerosis without stenosis. There was   no regurgitation. - Mitral valve: Leaflet thickening. Echogenic MitraClips (x2) noted   centrally, coapting the mitral leaflets. No significant stenosis   (mean gradient 3 mmHg) at HR of 79. Trace to mild regurgitation.   Valve area by continuity equation (using LVOT flow): 1.24 cm^2. - Left atrium: Severely dilated. - Right atrium: Moderately dilated. - Tricuspid valve: There was moderate regurgitation. - Pulmonary arteries: PA peak pressure: 62 mm Hg (S). - Inferior vena cava: The vessel was dilated. The respirophasic   diameter changes were blunted (< 50%), consistent with elevated   central venous pressure.  Impressions:  - Compared to a prior study on 5/1, the LVEF is stable. The   mitraclips are stable and there is trace to mild MR.   Patient Profile     72 y.o. female with chronic diastolic CHF and severe mitral regurgitation presenting 5/1 for mitral valve repair with percutaneous MitraClip  Assessment & Plan    1.  Severe mitral regurgitation, stage D, postoperative day #2 from MitraClip: The patient is clinically stable.  Her blood pressure has normalized.  Her postoperative day #1 echo is reviewed and demonstrates a good technical result with minimal residual mitral regurgitation.  She should continue on dual antiplatelet therapy with aspirin and clopidogrel for 3 months, then could be treated with antiplatelet monotherapy.  She should follow lifelong SBE prophylaxis.  She has follow-up already arranged with Dr. Algernon Huxley next week and we will arrange a 30-day valve clinic visit with an echocardiogram per protocol.  2.  Chronic diastolic heart failure: Patient with amyloid heart disease and severe mitral regurgitation as above.  Will resume her oral diuretics with furosemide 80 mg twice daily.  3.  Hypotension: Now resolved.  Will resume carvedilol at half of  the previous dose, 3.125 mg twice daily.  Will resume furosemide at patient's home dose.  4.  Anemia, mild postoperative blood loss.  Stable with no signs of active bleeding.  Disposition: Stable for discharge home this morning.  For questions or updates, please contact [place] Please consult [email or web address] for contact info under Cardiology/STEMI.      Signed, Sherren Mocha, MD  12/02/2017, 8:33 AM

## 2017-12-02 NOTE — Consult Note (Addendum)
   [facility] Inpatient Consult   12/02/2017  KYNSLEI ART 07-Aug-1945 [id number]   Ms. Melberg is active with [facility] Management program. She has been followed by [facility].   Met with Ms. Veno at bedside to discuss ongoing [facility] Management follow up. She is agreeable and written consent obtained. Due to high risk for unplanned readmission score of 22%, discussed [facility] RNCM referral. Ms. Aven is agreeable to this as well.  Ms. Ritacco states her son has recently moved in with her. However, she is the caregiver for her son.  Confirmed best contact number for Ms. Schlauch is [phone]. Denies having any concerns with transportation or obtaining medications.  Ms. Ehler is s/p mitral valve repair with MitraClip NTR x2. Medical history of CHF, CKD, renal cell cancer, CVA.  Provided [facility] Care Management folder.   Made inpatient RNCM aware [facility] Care Management will follow.   Will make referral to [facility] for CHF. Ms. Kohut Primary Care Provider's office (Dr. [name]) is listed as doing post transition of care call.   Marthenia Rolling, MSN-Ed, RN,BSN [facility] [place] Liaison [phone]

## 2017-12-02 NOTE — Discharge Summary (Signed)
Discharge Summary    Patient ID: Katie [name],  MRN: [id number], DOB/AGE: 04/16/1946 72 y.o.  Admit date: 12-22-17 Discharge date: 12/02/2017  Primary Care Provider: Seward Carol Primary Cardiologist: Dr. [name]   Discharge Diagnoses    Active Problems:   Non-rheumatic mitral regurgitation   Severe mitral regurgitation  Allergies Allergies  Allergen Reactions  . Tramadol Shortness Of Breath and Palpitations   Diagnostic Studies/Procedures    MitraClip: 2017-12-22  Procedure Performed:  Ultrasound-guided right transfemoral venous access  Double PreClose right femoral vein  Transseptal puncture using Bailess RF needle  Mitral valve repair with MitraClip NTR x 2   Co-Surgeons: Blane Ohara, MD and Rexene Alberts, MD  Echocardiographer: Dr Johnsie Cancel  Anesthesiologist: Dr Linna Caprice  Device Implant: Mitraclip NTR x 2 (lot numbers [id number] and [id number])  Procedural Indication:  Severe Primary Mitral Regurgitation (Stage D)   TTE: 12/01/17  Study Conclusions  - Left ventricle: The cavity size was normal. Wall thickness was   increased in a pattern of moderate LVH. There was severe focal   basal hypertrophy of the septum. 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 Systolic   function was normal. The estimated ejection fraction was in the   range of 55% to 60%. LV mid ventricle false tendon. Diffuse   hypokinesis. The study is not technically sufficient to allow   evaluation of LV diastolic function. - Aortic valve: Trileaflet. Sclerosis without stenosis. There was   no regurgitation. - Mitral valve: Leaflet thickening. Echogenic MitraClips (x2) noted   centrally, coapting the mitral leaflets. No significant stenosis   (mean gradient 3 mmHg) at HR of 79. Trace to mild regurgitation.   Valve area by continuity equation (using LVOT flow): 1.24 cm^2. - Left atrium: Severely dilated. - Right atrium: Moderately  dilated. - Tricuspid valve: There was moderate regurgitation. - Pulmonary arteries: PA peak pressure: 62 mm Hg (S). - Inferior vena cava: The vessel was dilated. The respirophasic   diameter changes were blunted (< 50%), consistent with elevated   central venous pressure.  Impressions:  - Compared to a prior study on Dec 23, 2022, the LVEF is stable. The   mitraclips are stable and there is trace to mild MR. _____________   History of Present Illness     72 y.o. 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 She was initially seen September 28, 2017 for consideration of MitraClip for treatment of severe, symptomatic mitral regurgitation.   She was seen on 4/16 by Dr. Burt Knack. Complained of a feeling of 'palpitations' in the upper chest, but described this as a pressure-like sensation in the upper chest and neck. This was not associated with physical exertion. Symptoms lasted 10-15 minutes then resolve on their own. Denied edema, orthopnea, or PND. She continued to have shortness of breath with modest activity such as walking on level ground.   She was not considered a candidate for surgical mitral valve replacement given her co-morbities. The option of MitraClip was presented to the patient with risks and benefit explained at this office visit. She was agreeable to this and set up for outpatient surgery. Of note was on ASA and plavix as an outpatient.   Hospital Course  Underwent successful MitraClip NTR placement x2 ([id number] and [id number]). Noted to have good reduction in MR. Did develop hypotension later that evening post procedure. Overall corrected with NS bolus. Her home coreg  was held the following day. CBC was stable. She was able to ambulate without complications with cardiac rehab. Follow up echo showed stable Clips and minimal residual regurgitation. She will be continued on DAPT with ASA/plavix for as least 3 months then continued on monotherapy. Will need to be on life long SBE prophylaxis. Her home coreg dose was reduced from 6.25mg  BID to 3.125mg  BID. Resumed on her home lasix dose.   Katie [name] was seen by Dr. Burt Knack and determined stable for discharge home. Follow up in the office has been arranged. Medications are listed below.   _____________  Discharge Vitals Blood pressure 115/67, pulse 72, temperature 98.2 F (36.8 C), temperature source Oral, resp. rate (!) 22, height 5\' 3"  (1.6 m), weight 146 lb (66.2 kg), last menstrual period 02/12/2013, SpO2 100 %.  Filed Weights   11/30/17 0642  Weight: 146 lb (66.2 kg)    Labs & Radiologic Studies    CBC Recent Labs    11/30/17 1458 12/01/17 0827  WBC 10.2 5.6  HGB 12.8 10.4*  HCT 38.2 32.3*  MCV 94.1 94.2  PLT 201 092   Basic Metabolic Panel Recent Labs    11/30/17 1458 12/01/17 0248  NA  --  141  K  --  4.1  CL  --  108  CO2  --  27  GLUCOSE  --  93  BUN  --  21*  CREATININE 1.29* 1.40*  CALCIUM  --  8.5*   Liver Function Tests No results for input(s): AST, ALT, ALKPHOS, BILITOT, PROT, ALBUMIN in the last 72 hours. No results for input(s): LIPASE, AMYLASE in the last 72 hours. Cardiac Enzymes No results for input(s): CKTOTAL, CKMB, CKMBINDEX, TROPONINI in the last 72 hours. BNP Invalid input(s): POCBNP D-Dimer No results for input(s): DDIMER in the last 72 hours. Hemoglobin A1C No results for input(s): HGBA1C in the last 72 hours. Fasting Lipid Panel No results for input(s): CHOL, HDL, LDLCALC, TRIG, CHOLHDL, LDLDIRECT in the last 72 hours. Thyroid Function Tests No results for input(s): TSH, T4TOTAL, T3FREE, THYROIDAB in the last 72 hours.  Invalid input(s):  FREET3 _____________  Dg Chest 2 View  Result Date: 11/25/2017 CLINICAL DATA:  Preop for heart surgery. EXAM: CHEST - 2 VIEW COMPARISON:  09/07/2017 FINDINGS: Cardiac silhouette is mildly enlarged. No mediastinal or hilar masses. No evidence of adenopathy. Lungs are hyperexpanded but clear. No pleural effusion or pneumothorax. Skeletal structures are intact. IMPRESSION: 1. No acute cardiopulmonary disease. 2. Cardiomegaly and hyperexpanded lungs stable from the prior study. Electronically Signed   By: Lajean Manes M.D.   On: 11/25/2017 15:00   Dg Chest Port 1 View  Result Date: 11/30/2017 CLINICAL DATA:  Mitral regurgitation EXAM: PORTABLE CHEST 1 VIEW COMPARISON:  11/25/2017 FINDINGS: The heart is moderately enlarged. Normal vascularity. Mild subsegmental atelectasis at the lung bases. No pneumothorax or pleural effusion. IMPRESSION: Cardiomegaly without decompensation. Electronically Signed   By: Marybelle Killings M.D.   On: 11/30/2017 17:15   Disposition   Pt is being discharged home today in good condition.  Follow-up Plans & Appointments    Follow-up Information    Larey Dresser, MD Follow up on 12/06/2017.   Specialty:  Cardiology Why:  at 8:40am for your follow up appt.  Contact information: [address].  [place] [address] [phone]          Discharge Instructions    Amb Referral to Cardiac Rehabilitation   Complete by:  As directed    Diagnosis:  Valve Repair   Valve:  Mitral Comment - mitral clip   Call MD for:  redness, tenderness, or signs of infection (pain, swelling, redness, odor or green/yellow discharge around incision site)   Complete by:  As directed    Diet - low sodium heart healthy   Complete by:  As directed    Discharge instructions   Complete by:  As directed    ACTIVITY AND EXERCISE . Daily activity and exercise are an important part of your recovery. People recover at different rates depending on their general health and type of  valve procedure. . No lifting, pushing, pulling more than 10 pounds (examples to avoid: groceries, vacuuming, gardening, golfing):  - For one week with a procedure through the groin. NOTE: You will typically see one of our providers 7-10 days after your procedure   DRIVING . If you have been told by your doctor in the past that you may not drive, you must talk with him/her before you begin driving again. . When you resume driving, you must have someone with you.  HYGIENE . If you had a femoral (leg) procedure, you may take a shower when you return home. After the shower, pat the site dry. Do NOT use powder, oils or lotions in your groin area until the site has completely healed.  - DO NOT scrub incision; pat dry with a towel  - DO NOT apply any lotions, oils, powders to the incision  - No tub baths / swimming for at least six weeks. . If you notice any fevers, chills, increased pain, swelling, bleeding or pus, please contact your doctor.  ADDITIONAL INFORMATION . If you are going to have an upcoming dental procedure, please contact our office as you may require antibiotics ahead of time to prevent infection on your heart valve.   Increase activity slowly   Complete by:  As directed       Discharge Medications     Medication List    TAKE these medications   acetaminophen 500 MG tablet Commonly known as:  TYLENOL Take 1,000 mg by mouth every 6 (six) hours as needed for headache (pain).   ARTIFICIAL TEARS OP Place 1 drop into both eyes daily as needed (dry eyes).   aspirin 81 MG tablet Take 1 tablet (81 mg total) daily by mouth.   butalbital-acetaminophen-caffeine 50-325-40 MG tablet Commonly known as:  FIORICET, ESGIC Take 1 tablet by mouth every 12 (twelve) hours as needed for headache.   carvedilol 6.25 MG tablet Commonly known as:  COREG Take 0.5 tablets (3.125 mg total) by mouth 2 (two) times daily. What changed:  how much to take   clopidogrel 75 MG tablet Commonly  known as:  PLAVIX Take 1 tablet (75 mg total) by mouth daily.   feeding supplement (ENSURE ENLIVE) Liqd Take 237 mLs by mouth 2 (two) times daily between meals.   furosemide 80 MG tablet Commonly known as:  LASIX Take 1 tablet (80 mg total) by mouth 2 (two) times daily.   levothyroxine 25 MCG tablet Commonly known as:  SYNTHROID, LEVOTHROID Take 25 mcg by mouth daily before breakfast.   potassium chloride SA 20 MEQ tablet Commonly known as:  K-DUR,KLOR-CON Take 1 tablet (20 mEq total) by mouth 2 (two) times daily.  pravastatin 80 MG tablet Commonly known as:  PRAVACHOL Take 1 tablet (80 mg total) by mouth at bedtime.        Outstanding Labs/Studies   Follow up echo in one month  Duration of Discharge Encounter   Greater than 30 minutes including physician time.  Signed, Reino Bellis NP-C 12/02/2017, 9:06 AM

## 2017-12-05 ENCOUNTER — Telehealth (HOSPITAL_COMMUNITY): Payer: Self-pay

## 2017-12-05 ENCOUNTER — Other Ambulatory Visit: Payer: Self-pay | Admitting: *Deleted

## 2017-12-05 ENCOUNTER — Telehealth: Payer: Self-pay

## 2017-12-05 NOTE — Patient Outreach (Signed)
[facility]) Care Management  12/05/2017  Katie [name] May 29, 1946 [id number]   New referral received from hospital liaison as member was recently admitted to hospital for mitral clip.  Primary MD office will complete transition of care assessment.  This care manager familiar with member as she has been active with [name] for complex case management as well as disease management.  Per chart, she has history of mitral regurgitation, amyloid heart disease, hypertension, heart failure, chronic kidney disease, and hyperlipidemia.   Call placed to member to follow up on discharge and current health condition.  She report she is doing well, no complications.  State she has follow up appointment with cardiologist tomorrow, but will have to drive herself and her son does not drive.  She is aware that her discharge instructions say no driving for a week, but state she does not have another mode of transportation.  Report compliance with medications, denies concerns.  She agrees to home visit and further assessment for next week.  [name], [place], MSN [place] [phone]

## 2017-12-05 NOTE — Telephone Encounter (Signed)
Patients insurance is active and benefits verified through [facility] - No co-pay, no deductible, out of pocket amount of $6,700/$668.96 has been met, no co-insurance, and no pre-authorization is required. Passport/reference [id number]  Will contact patient to see if she is interested in the Cardiac Rehab Program. If interested, patient will need to complete follow up appt. Once completed, patient will be contacted for scheduling upon review by the RN navigator.

## 2017-12-05 NOTE — Telephone Encounter (Signed)
Patient contacted regarding discharge from [facility] on 12/02/2017.  Patient understands to follow up with provider Dr Loralie Champagne on 12/06/2017 at 8:40 [facility]. Patient understands discharge instructions? yes Patient understands medications and regiment? yes Patient understands to bring all medications to this visit? yes  The pt complains of a fullness in her stomach when she eats and drinks.  This is similar to what she felt prior to procedure.  The pt's weight today is 143/144 lbs (pt weighs on two scales) and her baseline is around 145/146 lbs. No changes recommended at this time.  The pt was also advised of additional apts on 01/04/18 for Echo and OV.

## 2017-12-05 NOTE — Telephone Encounter (Signed)
Called and spoke with patient to see if she is interested in the Cardiac Rehab program. Patient stated she is interested. Explained to patient scheduling process and she verbalized understanding. Went over insurance with patient and she stated she understands. Will contact patient for scheduling once follow up appt has been completed.

## 2017-12-06 ENCOUNTER — Ambulatory Visit (HOSPITAL_COMMUNITY)
Admission: RE | Admit: 2017-12-06 | Discharge: 2017-12-06 | Disposition: A | Discharge status: Home / Self Care | Payer: Medicare Other | Source: Ambulatory Visit | Attending: Cardiology | Admitting: Cardiology

## 2017-12-06 ENCOUNTER — Other Ambulatory Visit (HOSPITAL_COMMUNITY): Payer: Self-pay | Admitting: *Deleted

## 2017-12-06 ENCOUNTER — Other Ambulatory Visit: Payer: Self-pay

## 2017-12-06 VITALS — BP 113/61 | HR 79 | Wt 144.0 lb

## 2017-12-06 DIAGNOSIS — I130 Hypertensive heart and chronic kidney disease with heart failure and stage 1 through stage 4 chronic kidney disease, or unspecified chronic kidney disease: Secondary | ICD-10-CM | POA: Diagnosis not present

## 2017-12-06 DIAGNOSIS — I340 Nonrheumatic mitral (valve) insufficiency: Secondary | ICD-10-CM | POA: Diagnosis not present

## 2017-12-06 DIAGNOSIS — Z7982 Long term (current) use of aspirin: Secondary | ICD-10-CM | POA: Insufficient documentation

## 2017-12-06 DIAGNOSIS — N183 Chronic kidney disease, stage 3 (moderate): Secondary | ICD-10-CM | POA: Insufficient documentation

## 2017-12-06 DIAGNOSIS — E039 Hypothyroidism, unspecified: Secondary | ICD-10-CM | POA: Diagnosis not present

## 2017-12-06 DIAGNOSIS — I5032 Chronic diastolic (congestive) heart failure: Secondary | ICD-10-CM | POA: Insufficient documentation

## 2017-12-06 DIAGNOSIS — Z79899 Other long term (current) drug therapy: Secondary | ICD-10-CM | POA: Diagnosis not present

## 2017-12-06 DIAGNOSIS — Z7902 Long term (current) use of antithrombotics/antiplatelets: Secondary | ICD-10-CM | POA: Diagnosis not present

## 2017-12-06 DIAGNOSIS — E854 Organ-limited amyloidosis: Secondary | ICD-10-CM | POA: Diagnosis not present

## 2017-12-06 DIAGNOSIS — Z905 Acquired absence of kidney: Secondary | ICD-10-CM | POA: Diagnosis not present

## 2017-12-06 DIAGNOSIS — Z79890 Hormone replacement therapy (postmenopausal): Secondary | ICD-10-CM | POA: Diagnosis not present

## 2017-12-06 DIAGNOSIS — Z800 Family history of malignant neoplasm of digestive organs: Secondary | ICD-10-CM | POA: Diagnosis not present

## 2017-12-06 DIAGNOSIS — Z85528 Personal history of other malignant neoplasm of kidney: Secondary | ICD-10-CM | POA: Insufficient documentation

## 2017-12-06 DIAGNOSIS — Z8673 Personal history of transient ischemic attack (TIA), and cerebral infarction without residual deficits: Secondary | ICD-10-CM | POA: Diagnosis not present

## 2017-12-06 DIAGNOSIS — G629 Polyneuropathy, unspecified: Secondary | ICD-10-CM | POA: Diagnosis not present

## 2017-12-06 DIAGNOSIS — I43 Cardiomyopathy in diseases classified elsewhere: Secondary | ICD-10-CM | POA: Diagnosis not present

## 2017-12-06 DIAGNOSIS — E785 Hyperlipidemia, unspecified: Secondary | ICD-10-CM | POA: Diagnosis not present

## 2017-12-06 DIAGNOSIS — Z9889 Other specified postprocedural states: Secondary | ICD-10-CM

## 2017-12-06 NOTE — Patient Instructions (Addendum)
You have been referred to Dr. Broadus John genetic counseling  You have been referred to Dr. Posey Pronto  For amyloid clinic  Cardiomems discussed   Your physician recommends that you schedule a follow-up appointment in: 2 months with Dr. [name]

## 2017-12-06 NOTE — H&P (View-Only) (Signed)
PCP: Dr. [name] HF Cardiology: Dr. [name]  72 yo with history of chronic diastolic CHF, mitral regurgitation, CVA, and CKD returns for followup of CHF.   Patient was initially admitted with diastolic CHF in 4/85. Echo showed EF 60-65% with grade 3 diastolic dysfunction and moderate MR.  She had cath showing no coronary disease. 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  TEE this admission showed moderate-severe MR.  She was admitted again in 10/18 with acute on chronic diastolic CHF. Echo this admission showed EF 50-55%, severe LVH, restrictive diastolic dysfunction, severe MR, moderate-severe TR.  She was referred to Dr. [name] who saw her recently for mitral valve evaluation.  Upon review of studies, regurgitation thought to be more in the moderate range and likely functional.   Patient had PYP amyloid scan in 12/18, this was suggestive of transthyretin amyloidosis.  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   Admission for A/C diastolic HF 4/6-2/70/3500. Required IV diuresis, then transitioned to 80 mg PO lasix BID. Metop was DC'd due to low blood pressures. Cardiac MRI strongly suggestive of amyloidosis, moderate to severe MR (moderate by regurgitant fraction 41% but severe visually). Echo repeated: EF 40-45%, moderate to severe LVH, severe MR, severe dilation of LA and RA, moderate TR, PASP 49.  She had Mitraclip x 2 placed in 5/19.  Repeat echo post-mitraclip showed EF 55-60%, moderate LVH, mean gradient 3 mmHg across the MV with trace-mild MR.   She returns today for HF follow up. She still fatigues easily.  Breathing seems better since Mitraclip was placed. She can walk around the house without dyspnea and she was able to walk into the office today without problems. She will fatigue with longer distances walking.  No orthopnea/PND. No chest pain.  Occasional mild lightheadedness with standing.    ECG (personally reviewed): NSR, low  voltage  Labs (10/18): K 4.4, creatinine 1.27, LDL 56, HDL 39 Labs (11/18): K 3.9, creatinine 1.43, SPEP negative Labs (2/19): K 3.8, creatinine 1.40, hgb 13.4 Labs (5/19): K 4.1, creatinine 1.4, hgb 10.8  PMH: 1. HTN 2. Hyperlipidemia 3. 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  LINQ monitor placed.  4. Chronic diastolic CHF:  - LHC (9/38): Normal coronaries.  - Echo (10/18): EF 50-55%, severe LVH with speckled myocardium concerning for amyloidosis, restrictive diastolic function with septal flattening, severe MR, moderate-severe TR, severe biatrial enlargement, PASP 54 mmHg.  - PYP amyloid scan: Grade 3, suggestive of transthyretin amyloidosis.  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   - Echo (5/19): EF 55-60%, moderate LVH c/w amyloidosis, Mitraclip x 2 with mean MV gradient 3 mmHg, trace-mild MR, moderate TR, PASP 62 mmHg.  5. Mitral regurgitation: Think mixed functional/degenerative - TEE (7/18): EF 45-50%, moderate-severe MR, mild-moderate TR.  - Echo (10/18) read as severe MR.  - 2/19 echo with severe MR.  - 2/19 cardiac MRI with moderate to severe MR.  - Mitraclip x 2 in 5/19.   - Echo 5/19 post-Mitraclip with mean MV gradient 3 mmHg, trace-mild MR.  6. CKD stage 3 7. Hypothyroidism 8. H/o renal  cell CA s/p nephrectomy.   Social History   Socioeconomic History  . Marital status: Divorced    Spouse name: Not on file  . Number of children: Not on file  . Years of education: Not on file  . Highest education level: Not on file  Occupational History  . Not on  file  Social Needs  . Financial resource strain: Not on file  . Food insecurity:    Worry: Not on file    Inability: Not on file  . Transportation needs:    Medical: Not on file    Non-medical: Not on file  Tobacco Use  . Smoking status: Never Smoker  . Smokeless tobacco: Never Used  Substance and Sexual Activity  . Alcohol use: No  . Drug use: No  . Sexual activity: Never  Lifestyle  . Physical activity:    Days per week: Not on file    Minutes per session: Not on file  . Stress: Not on file  Relationships  . Social connections:    Talks on phone: Not on file    Gets together: Not on file    Attends religious service: Not on file    Active member of club or organization: Not on file    Attends meetings of clubs or organizations: Not on file    Relationship status: Not on file  . Intimate partner violence:    Fear of current or ex partner: Not on file    Emotionally abused: Not on file    Physically abused: Not on file    Forced sexual activity: Not on file  Other Topics Concern  . Not on file  Social History Narrative  . Not on file   Family History  Problem Relation Age of Onset  . Stroke Brother   . Diabetes Mellitus II Sister   . Breast cancer Sister   . Colon cancer Sister    ROS: All systems reviewed and negative except as per HPI.   Current Outpatient Medications  Medication Sig Dispense Refill  . acetaminophen (TYLENOL) 500 MG tablet Take 1,000 mg by mouth every 6 (six) hours as needed for headache (pain).    Marland Kitchen aspirin 81 MG tablet Take 1 tablet (81 mg total) daily by mouth.    . butalbital-acetaminophen-caffeine (FIORICET, ESGIC) 50-325-40 MG tablet Take 1 tablet by mouth every 12 (twelve) hours as needed for headache. 14 tablet 0  . carvedilol (COREG) 6.25 MG tablet Take 0.5 tablets (3.125 mg total) by mouth 2 (two) times daily. 180 tablet 0  . clopidogrel (PLAVIX) 75 MG tablet Take 1 tablet (75 mg total) by mouth daily. 30 tablet 1  . feeding  supplement, ENSURE ENLIVE, (ENSURE ENLIVE) LIQD Take 237 mLs by mouth 2 (two) times daily between meals. 237 mL 0  . furosemide (LASIX) 80 MG tablet Take 1 tablet (80 mg total) by mouth 2 (two) times daily. 60 tablet 0  . Hypromellose (ARTIFICIAL TEARS OP) Place 1 drop into both eyes daily as needed (dry eyes).    Marland Kitchen levothyroxine (SYNTHROID, LEVOTHROID) 25 MCG tablet Take 25 mcg by mouth daily before breakfast.    . potassium chloride SA (K-DUR,KLOR-CON) 20 MEQ tablet Take 1 tablet (20 mEq total) by mouth 2 (two) times daily. 60 tablet 0  . pravastatin (PRAVACHOL) 80 MG tablet Take 1 tablet (80 mg total) by mouth at bedtime. 30 tablet 11   No current facility-administered medications for this encounter.    BP 113/61   Pulse 79  Wt 144 lb (65.3 kg)   LMP 02/12/2013   SpO2 100%   BMI 25.51 kg/m  General: NAD Neck: No JVD, no thyromegaly or thyroid nodule.  Lungs: Clear to auscultation bilaterally with normal respiratory effort. CV: Nondisplaced PMI.  Heart regular S1/S2, no S3/S4, no murmur.  No peripheral edema.  No carotid bruit.  Normal pedal pulses.  Abdomen: Soft, nontender, no hepatosplenomegaly, no distention.  Skin: Intact without lesions or rashes.  Neurologic: Alert and oriented x 3.  Psych: Normal affect. Extremities: No clubbing or cyanosis.  HEENT: Normal.   Assessment/Plan: 1. Chronic diastolic CHF: Normal EF on 10/18 echo with severe LVH and speckled myocardium consistent with cardiac amyloidosis.  SPEP was negative. 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 Cardiac MRI also strongly suggestive of cardiac amyloidosis.  On exam today, she is not volume overloaded. Suspect NYHA class II-III symptoms more due to fatigue at this point than dyspnea. She does feel some improvement in symptoms s/p Mitraclip.  - Continue Lasix 80 mg BID.  - Continue carvedilol 3.125 mg  BID. - She will have Cardiomems implant scheduled to better manage her volume.  2. Mitral regurgitation: Mitral regurgitation seems to fluctuate somewhat based on loading conditions.  Most recently, echo in 2/19 suggested severe MR and cardiac MRI suggested moderate to severe MR (moderate by 41% regurgitant fraction but visually severe). I suspect that the etiology is mixed - functional as well as degenerative with thickening and restriction of posterior leaflet. When she is volume overloaded, her murmur is more prominent. She is now s/p Mitraclip procedure in 5/19. Post-clip echo in 5/19 showed mean gradient 3 mmHg across the MV with trace-mild MR. .  3. CKD: Stage III.  BMET has been stable recently.   4. CVA: 7/18. Has LINQ monitor, no atrial fibrillation detected so far.  - Continue ASA 81.   - Continue Plavix. - Continue statin, lipids ok 09/2017 5. 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  PND score = 1.  - Refer to neurologist (Dr. Posey Pronto) for formal evaluation of neuropathy in order for her to qualify for patisiran treatment.  6. Cardiac amyloidosis: Genetic TTR amyloidosis.  We are trying to arrange for patisiran treatment.  Will need neurology evaluation.   - I will also refer to Dr Broadus John for genetic counseling, family can be tested for her amyloidosis gene mutation.   Follow up in 2 months.   Loralie Champagne,  12/06/2017 10:46 PM

## 2017-12-06 NOTE — Progress Notes (Signed)
Cardiomems implant scheduled for 01/05/18 at [place].  Approved by [facility], Josem Kaufmann #[id number].  All instructions and information reviewed w/patient, she verbalizes understanding.

## 2017-12-06 NOTE — Progress Notes (Signed)
PCP: Dr. [name] HF Cardiology: Dr. [name]  72 yo with history of chronic diastolic CHF, mitral regurgitation, CVA, and CKD returns for followup of CHF.   Patient was initially admitted with diastolic CHF in 2/95. Echo showed EF 60-65% with grade 3 diastolic dysfunction and moderate MR.  She had cath showing no coronary disease. 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  TEE this admission showed moderate-severe MR.  She was admitted again in 10/18 with acute on chronic diastolic CHF. Echo this admission showed EF 50-55%, severe LVH, restrictive diastolic dysfunction, severe MR, moderate-severe TR.  She was referred to Dr. [name] who saw her recently for mitral valve evaluation.  Upon review of studies, regurgitation thought to be more in the moderate range and likely functional.   Patient had PYP amyloid scan in 12/18, this was suggestive of transthyretin amyloidosis.  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   Admission for A/C diastolic HF 2/8-4/13/2440. Required IV diuresis, then transitioned to 80 mg PO lasix BID. Metop was DC'd due to low blood pressures. Cardiac MRI strongly suggestive of amyloidosis, moderate to severe MR (moderate by regurgitant fraction 41% but severe visually). Echo repeated: EF 40-45%, moderate to severe LVH, severe MR, severe dilation of LA and RA, moderate TR, PASP 49.  She had Mitraclip x 2 placed in 5/19.  Repeat echo post-mitraclip showed EF 55-60%, moderate LVH, mean gradient 3 mmHg across the MV with trace-mild MR.   She returns today for HF follow up. She still fatigues easily.  Breathing seems better since Mitraclip was placed. She can walk around the house without dyspnea and she was able to walk into the office today without problems. She will fatigue with longer distances walking.  No orthopnea/PND. No chest pain.  Occasional mild lightheadedness with standing.    ECG (personally reviewed): NSR, low  voltage  Labs (10/18): K 4.4, creatinine 1.27, LDL 56, HDL 39 Labs (11/18): K 3.9, creatinine 1.43, SPEP negative Labs (2/19): K 3.8, creatinine 1.40, hgb 13.4 Labs (5/19): K 4.1, creatinine 1.4, hgb 10.8  PMH: 1. HTN 2. Hyperlipidemia 3. 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  LINQ monitor placed.  4. Chronic diastolic CHF:  - LHC (1/02): Normal coronaries.  - Echo (10/18): EF 50-55%, severe LVH with speckled myocardium concerning for amyloidosis, restrictive diastolic function with septal flattening, severe MR, moderate-severe TR, severe biatrial enlargement, PASP 54 mmHg.  - PYP amyloid scan: Grade 3, suggestive of transthyretin amyloidosis.  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   - Echo (5/19): EF 55-60%, moderate LVH c/w amyloidosis, Mitraclip x 2 with mean MV gradient 3 mmHg, trace-mild MR, moderate TR, PASP 62 mmHg.  5. Mitral regurgitation: Think mixed functional/degenerative - TEE (7/18): EF 45-50%, moderate-severe MR, mild-moderate TR.  - Echo (10/18) read as severe MR.  - 2/19 echo with severe MR.  - 2/19 cardiac MRI with moderate to severe MR.  - Mitraclip x 2 in 5/19.   - Echo 5/19 post-Mitraclip with mean MV gradient 3 mmHg, trace-mild MR.  6. CKD stage 3 7. Hypothyroidism 8. H/o renal  cell CA s/p nephrectomy.   Social History   Socioeconomic History  . Marital status: Divorced    Spouse name: Not on file  . Number of children: Not on file  . Years of education: Not on file  . Highest education level: Not on file  Occupational History  . Not on  file  Social Needs  . Financial resource strain: Not on file  . Food insecurity:    Worry: Not on file    Inability: Not on file  . Transportation needs:    Medical: Not on file    Non-medical: Not on file  Tobacco Use  . Smoking status: Never Smoker  . Smokeless tobacco: Never Used  Substance and Sexual Activity  . Alcohol use: No  . Drug use: No  . Sexual activity: Never  Lifestyle  . Physical activity:    Days per week: Not on file    Minutes per session: Not on file  . Stress: Not on file  Relationships  . Social connections:    Talks on phone: Not on file    Gets together: Not on file    Attends religious service: Not on file    Active member of club or organization: Not on file    Attends meetings of clubs or organizations: Not on file    Relationship status: Not on file  . Intimate partner violence:    Fear of current or ex partner: Not on file    Emotionally abused: Not on file    Physically abused: Not on file    Forced sexual activity: Not on file  Other Topics Concern  . Not on file  Social History Narrative  . Not on file   Family History  Problem Relation Age of Onset  . Stroke Brother   . Diabetes Mellitus II Sister   . Breast cancer Sister   . Colon cancer Sister    ROS: All systems reviewed and negative except as per HPI.   Current Outpatient Medications  Medication Sig Dispense Refill  . acetaminophen (TYLENOL) 500 MG tablet Take 1,000 mg by mouth every 6 (six) hours as needed for headache (pain).    Marland Kitchen aspirin 81 MG tablet Take 1 tablet (81 mg total) daily by mouth.    . butalbital-acetaminophen-caffeine (FIORICET, ESGIC) 50-325-40 MG tablet Take 1 tablet by mouth every 12 (twelve) hours as needed for headache. 14 tablet 0  . carvedilol (COREG) 6.25 MG tablet Take 0.5 tablets (3.125 mg total) by mouth 2 (two) times daily. 180 tablet 0  . clopidogrel (PLAVIX) 75 MG tablet Take 1 tablet (75 mg total) by mouth daily. 30 tablet 1  . feeding  supplement, ENSURE ENLIVE, (ENSURE ENLIVE) LIQD Take 237 mLs by mouth 2 (two) times daily between meals. 237 mL 0  . furosemide (LASIX) 80 MG tablet Take 1 tablet (80 mg total) by mouth 2 (two) times daily. 60 tablet 0  . Hypromellose (ARTIFICIAL TEARS OP) Place 1 drop into both eyes daily as needed (dry eyes).    Marland Kitchen levothyroxine (SYNTHROID, LEVOTHROID) 25 MCG tablet Take 25 mcg by mouth daily before breakfast.    . potassium chloride SA (K-DUR,KLOR-CON) 20 MEQ tablet Take 1 tablet (20 mEq total) by mouth 2 (two) times daily. 60 tablet 0  . pravastatin (PRAVACHOL) 80 MG tablet Take 1 tablet (80 mg total) by mouth at bedtime. 30 tablet 11   No current facility-administered medications for this encounter.    BP 113/61   Pulse 79  Wt 144 lb (65.3 kg)   LMP 02/12/2013   SpO2 100%   BMI 25.51 kg/m  General: NAD Neck: No JVD, no thyromegaly or thyroid nodule.  Lungs: Clear to auscultation bilaterally with normal respiratory effort. CV: Nondisplaced PMI.  Heart regular S1/S2, no S3/S4, no murmur.  No peripheral edema.  No carotid bruit.  Normal pedal pulses.  Abdomen: Soft, nontender, no hepatosplenomegaly, no distention.  Skin: Intact without lesions or rashes.  Neurologic: Alert and oriented x 3.  Psych: Normal affect. Extremities: No clubbing or cyanosis.  HEENT: Normal.   Assessment/Plan: 1. Chronic diastolic CHF: Normal EF on 10/18 echo with severe LVH and speckled myocardium consistent with cardiac amyloidosis.  SPEP was negative. 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 Cardiac MRI also strongly suggestive of cardiac amyloidosis.  On exam today, she is not volume overloaded. Suspect NYHA class II-III symptoms more due to fatigue at this point than dyspnea. She does feel some improvement in symptoms s/p Mitraclip.  - Continue Lasix 80 mg BID.  - Continue carvedilol 3.125 mg  BID. - She will have Cardiomems implant scheduled to better manage her volume.  2. Mitral regurgitation: Mitral regurgitation seems to fluctuate somewhat based on loading conditions.  Most recently, echo in 2/19 suggested severe MR and cardiac MRI suggested moderate to severe MR (moderate by 41% regurgitant fraction but visually severe). I suspect that the etiology is mixed - functional as well as degenerative with thickening and restriction of posterior leaflet. When she is volume overloaded, her murmur is more prominent. She is now s/p Mitraclip procedure in 5/19. Post-clip echo in 5/19 showed mean gradient 3 mmHg across the MV with trace-mild MR. .  3. CKD: Stage III.  BMET has been stable recently.   4. CVA: 7/18. Has LINQ monitor, no atrial fibrillation detected so far.  - Continue ASA 81.   - Continue Plavix. - Continue statin, lipids ok 09/2017 5. 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  PND score = 1.  - Refer to neurologist (Dr. Posey Pronto) for formal evaluation of neuropathy in order for her to qualify for patisiran treatment.  6. Cardiac amyloidosis: Genetic TTR amyloidosis.  We are trying to arrange for patisiran treatment.  Will need neurology evaluation.   - I will also refer to Dr Broadus John for genetic counseling, family can be tested for her amyloidosis gene mutation.   Follow up in 2 months.   Loralie Champagne,  12/06/2017 10:46 PM

## 2017-12-07 ENCOUNTER — Ambulatory Visit: Payer: Medicare Other | Admitting: Cardiovascular Disease

## 2017-12-07 ENCOUNTER — Encounter (HOSPITAL_COMMUNITY): Payer: Self-pay | Admitting: Cardiovascular Disease

## 2017-12-08 ENCOUNTER — Encounter: Payer: Self-pay | Admitting: Neurology

## 2017-12-09 ENCOUNTER — Other Ambulatory Visit: Payer: Self-pay | Admitting: *Deleted

## 2017-12-09 DIAGNOSIS — E854 Organ-limited amyloidosis: Secondary | ICD-10-CM

## 2017-12-09 DIAGNOSIS — I43 Cardiomyopathy in diseases classified elsewhere: Secondary | ICD-10-CM

## 2017-12-12 ENCOUNTER — Encounter: Payer: Self-pay | Admitting: *Deleted

## 2017-12-12 ENCOUNTER — Other Ambulatory Visit: Payer: Self-pay | Admitting: *Deleted

## 2017-12-12 NOTE — Patient Outreach (Signed)
[facility]) Care Management   12/12/2017  Katie [name] 1945/10/11 [id number]  Katie [name] is an 72 y.o. female  Subjective:   Member alert and oriented x3, denies any current shortness of breath or chest discomfort.  Report compliance with medications and daily weights.  State she has 2 scales, sometimes use them both.  Advised to use the one connected to telemonitoring.  Objective:   Review of Systems  Constitutional: Negative.   HENT: Negative.   Eyes: Negative.   Respiratory: Positive for shortness of breath.        Intermittent  Cardiovascular: Negative.   Gastrointestinal: Negative.   Genitourinary: Negative.   Musculoskeletal: Negative.   Skin: Negative.   Neurological: Negative.   Endo/Heme/Allergies: Negative.   Psychiatric/Behavioral: Negative.     Physical Exam  Constitutional: She is oriented to person, place, and time. She appears well-developed and well-nourished.  Neck: Normal range of motion.  Cardiovascular: Normal rate, regular rhythm and normal heart sounds.  Respiratory: Effort normal and breath sounds normal.  GI: Soft. Bowel sounds are normal.  Musculoskeletal: Normal range of motion.  Neurological: She is alert and oriented to person, place, and time.  Skin: Skin is warm and dry.   BP 108/64 (BP Location: Right Arm, Patient Position: Sitting, Cuff Size: Normal)   Pulse 63   Resp 20   Ht 1.6 m ('5\' 3"'$ )   Wt 142 lb (64.4 kg)   LMP 02/12/2013   SpO2 98%   BMI 25.15 kg/m   Encounter Medications:   Outpatient Encounter Medications as of 12/12/2017  Medication Sig  . acetaminophen (TYLENOL) 500 MG tablet Take 1,000 mg by mouth every 6 (six) hours as needed for headache (pain).  Marland Kitchen aspirin 81 MG tablet Take 1 tablet (81 mg total) daily by mouth.  . butalbital-acetaminophen-caffeine (FIORICET, ESGIC) 50-325-40 MG tablet Take 1 tablet by mouth every 12 (twelve) hours as needed for headache.  . carvedilol (COREG) 6.25 MG tablet Take  0.5 tablets (3.125 mg total) by mouth 2 (two) times daily.  . clopidogrel (PLAVIX) 75 MG tablet Take 1 tablet (75 mg total) by mouth daily.  . feeding supplement, ENSURE ENLIVE, (ENSURE ENLIVE) LIQD Take 237 mLs by mouth 2 (two) times daily between meals.  . furosemide (LASIX) 80 MG tablet Take 1 tablet (80 mg total) by mouth 2 (two) times daily.  . Hypromellose (ARTIFICIAL TEARS OP) Place 1 drop into both eyes daily as needed (dry eyes).  Marland Kitchen levothyroxine (SYNTHROID, LEVOTHROID) 25 MCG tablet Take 25 mcg by mouth daily before breakfast.  . potassium chloride SA (K-DUR,KLOR-CON) 20 MEQ tablet Take 1 tablet (20 mEq total) by mouth 2 (two) times daily.  . pravastatin (PRAVACHOL) 80 MG tablet Take 1 tablet (80 mg total) by mouth at bedtime.   No facility-administered encounter medications on file as of 12/12/2017.     Functional Status:   In your present state of health, do you have any difficulty performing the following activities: 12/12/2017 11/30/2017  Hearing? N -  Vision? N -  Comment - -  Difficulty concentrating or making decisions? N -  Walking or climbing stairs? Y -  Comment - -  Dressing or bathing? N -  Doing errands, shopping? Tempie Donning  Preparing Food and eating ? N -  Using the Toilet? N -  In the past six months, have you accidently leaked urine? N -  Do you have problems with loss of bowel control? N -  Managing your Medications?  N -  Managing your Finances? N -  Housekeeping or managing your Housekeeping? N -  Some recent data might be hidden    Fall/Depression Screening:    Fall Risk  12/12/2017 11/23/2017 10/26/2017  Falls in the past year? Yes No No  Number falls in past yr: 1 - -  Injury with Fall? No - -  Risk for fall due to : - - -  Follow up Falls prevention discussed - -   PHQ 2/9 Scores 12/12/2017 07/28/2017 03/03/2017 02/22/2017  PHQ - 2 Score 0 0 0 0    Assessment:    Met with member at scheduled time, no distress noted.  Report her follow up with cardiology  was good, no concerns.  State she is scheduled for cardiomems implant in effort to monitor fluid status better.  Has education packet for device, reviewed with member, denies questions.  She has appointment with neurology on 5/16 and cardiology on 6/5.  Will provide her own transportation.   She denies any concerns at this time, advised to contact this care manager with questions.  Plan:   Will follow up within the next 2 weeks.  [facility] CM Care Plan Problem One     Most Recent Value  Care Plan Problem One  risk for admission regarding treatment of cardiac amyloidosis as evidenced by recent mitral clipping procedure  Role Documenting the Problem One  Care Management [place] for Problem One  Active  [facility] Long Term Goal   In the next 31 days the patient will not be admitted to the hospital as inpatient  [place] Term Goal Start Date  12/12/17  Interventions for Problem One Long Term Goal  Discussed with member the importance of following discharge instructions, including follow up appointments, medications, diet, to decrease the risk of readmission  [facility] CM Short Term Goal #1   Member will report no signs/symptoms of heart failure (elevated weight, sweling, shortness of breath)  [facility] CM Short Term Goal #1 Start Date  12/12/17  Interventions for Short Term Goal #1  Edcuated on heart failure zones and when to contact MD.  Advised of importance of using telemonitoring equipment for daily weights for closer monitoring  [facility] CM Short Term Goal #2   Member will keep and attend all appointments with specialty MDs over the next 4 weeks  [facility] CM Short Term Goal #2 Start Date  12/12/17  Interventions for Short Term Goal #2  Discussed with member the importance of attending appointments in effort to establish effective plan of care.     [name], [place], MSN [place] [phone]

## 2017-12-15 ENCOUNTER — Ambulatory Visit (INDEPENDENT_AMBULATORY_CARE_PROVIDER_SITE_OTHER): Payer: Medicare Other | Admitting: Neurology

## 2017-12-15 ENCOUNTER — Other Ambulatory Visit (INDEPENDENT_AMBULATORY_CARE_PROVIDER_SITE_OTHER): Payer: Medicare Other

## 2017-12-15 ENCOUNTER — Encounter: Payer: Self-pay | Admitting: Neurology

## 2017-12-15 VITALS — BP 110/68 | HR 71 | Ht 63.0 in | Wt 142.5 lb

## 2017-12-15 DIAGNOSIS — D472 Monoclonal gammopathy: Secondary | ICD-10-CM

## 2017-12-15 DIAGNOSIS — G990 Autonomic neuropathy in diseases classified elsewhere: Secondary | ICD-10-CM | POA: Diagnosis not present

## 2017-12-15 DIAGNOSIS — I43 Cardiomyopathy in diseases classified elsewhere: Secondary | ICD-10-CM

## 2017-12-15 DIAGNOSIS — E854 Organ-limited amyloidosis: Secondary | ICD-10-CM

## 2017-12-15 LAB — VITAMIN B12: Vitamin B-12: 611 pg/mL (ref 211–911)

## 2017-12-15 LAB — FOLATE: Folate: 16.2 ng/mL (ref >5.9)

## 2017-12-15 NOTE — Patient Instructions (Signed)
Check labs  Return to clinic in 4 months

## 2017-12-15 NOTE — Procedures (Signed)
[facility]  [address], [address]  [place], [place] [address] Tel: [phone] Fax:  [phone] Test Date:  12/15/2017  Patient: Katie [name] DOB: [birth year] Physician: Narda Amber, DO  Sex: Female Height: 5'3" Ref Phys: Narda Amber, DO  ID#: [id number] Temp: 33.2C Technician:    Patient Complaints: This is a 72 year old female with hereditary amyloidosis referred for evaluation of neuropathy.  NCV & EMG Findings: Extensive electrodiagnostic testing of the right upper and lower extremity shows:  1. Right median and ulnar sensory responses are prolonged latency (R4.5, R5.0 ms) and normal amplitude. Right radial, sural, and superficial peroneal nerves are within normal limits. 2. Right ulnar motor response shows reduced amplitude (5.7 mV)and conduction velocity slowing across the elbow (A Elbow-B Elbow, 34 m/s).  Right median, peroneal, and tibial motor responses are within normal limits.  3. Right tibial H reflex study is within normal limits. 4. Chronic motor axon loss changes are seen in the distal hand muscles as well as the right flexor carpi ulnaris muscle. There is no evidence of active or chronic motor axon loss changes affecting any of the tested muscles in the lower extremity.  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    Alternatively, right carpal tunnel and cubital tunnel syndrome cannot be excluded. Correlate clinically.   ___________________________ Narda Amber, DO    Nerve Conduction Studies Anti Sensory Summary Table   Site NR Peak (ms) Norm Peak (ms) P-T Amp (V) Norm P-T Amp  Right Median Anti Sensory (2nd Digit)  Wrist    4.5 <3.8 22.4 >10  Right Radial Anti Sensory (Base 1st Digit)  Wrist    2.3 <2.8 17.6 >10  Right Sup Peroneal Anti Sensory (Ant Lat Mall)  12 cm    2.8  <4.6 3.9 >3  Right Sural Anti Sensory (Lat Mall)  Calf    3.0 <4.6 9.8 >3  Right Ulnar Anti Sensory (5th Digit)  Wrist    5.0 <3.2 13.2 >5   Motor Summary Table   Site NR Onset (ms) Norm Onset (ms) O-P Amp (mV) Norm O-P Amp Site1 Site2 Delta-0 (ms) Dist (cm) Vel (m/s) Norm Vel (m/s)  Right Median Motor (Abd Poll Brev)  Wrist    3.8 <4.0 6.5 >5 Elbow Wrist 4.8 28.5 59 >50  Elbow    8.6  6.1         Right Peroneal Motor (Ext Dig Brev)  Ankle    2.9 <6.0 7.7 >2.5 B Fib Ankle 6.7 35.0 52 >40  B Fib    9.6  7.2  Poplt B Fib 1.1 8.0 73 >40  Poplt    10.7  7.1         Right Tibial Motor (Abd Hall Brev)  Ankle    4.5 <6.0 9.6 >4 Knee Ankle 7.5 35.0 47 >40  Knee    12.0  8.5         Right Ulnar Motor (Abd Dig Minimi)  Wrist    2.9 <3.1 5.7 >7 B Elbow Wrist 4.7 23.5 50 >50  B Elbow    7.6  4.7  A Elbow B Elbow 2.9 10.0 34 >50  A Elbow    10.5  4.3          H Reflex Studies   NR H-Lat (ms) Lat Norm (ms) L-R H-Lat (ms)  Right Tibial (Gastroc)     31.43 <  35    EMG   Side Muscle Ins Act Fibs Psw Fasc Number Recrt Dur Dur. Amp Amp. Poly Poly. Comment  Right AntTibialis Nml Nml Nml Nml Nml Nml Nml Nml Nml Nml Nml Nml N/A  Right 1stDorInt Nml Nml Nml Nml 2- Rapid Many 1+ Some 1+ Nml Nml N/A  Right Abd Poll Brev Nml Nml Nml Nml 1- Rapid Some 1+ Some 1+ Nml Nml N/A  Right Ext Indicis Nml Nml Nml Nml 1- Rapid Few 1+ Few 1+ Nml Nml N/A  Right PronatorTeres Nml Nml Nml Nml Nml Nml Nml Nml Nml Nml Nml Nml N/A  Right Biceps Nml Nml Nml Nml Nml Nml Nml Nml Nml Nml Nml Nml N/A  Right Triceps Nml Nml Nml Nml Nml Nml Nml Nml Nml Nml Nml Nml N/A  Right Deltoid Nml Nml Nml Nml Nml Nml Nml Nml Nml Nml Nml Nml N/A  Right FlexCarpiUln Nml Nml Nml Nml 1- Rapid Some 1+ Some 1+ Nml Nml N/A  Right Gastroc Nml Nml Nml Nml Nml Nml Nml Nml Nml Nml Nml Nml N/A  Right RectFemoris Nml Nml Nml Nml Nml Nml Nml Nml Nml Nml Nml Nml N/A  Right Flex Dig Long Nml Nml Nml Nml Nml Nml Nml Nml Nml Nml Nml Nml N/A       Waveforms:

## 2017-12-15 NOTE — Progress Notes (Signed)
[facility] Clinic Note - Initial Visit   Date: 12/15/17  [name]: [id number] DOB: 1946-05-31   Dear Dr. [name]:  Thank you for your kind referral of Katie [name] for consultation of neuropathy. Although her history is well known to you, please allow [place] to reiterate it for the purpose of our medical record. The patient was accompanied to the clinic by self.    History of Present Illness: Katie [name] is a 72 y.o. 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  She has history of heart failure since 2017 and underwent PYP amyloid scan in December suggestive of transthyretin amyloidosis.  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  She is being evaluated for patisiran treatment.  From a neurological standpoint, patient has had history of bilateral carpal tunnel release and continues to have hand tingling.  She has some weakness related to arthritis, but denies dropping objects. Over the past year, she has intermittent tingling of both feet.  She denies imbalance and has not suffered any falls.  She walks unassisted.  She manages her own household duties such as cooking, cleaning, and driving. Her son who is an amputee lives with her.  There is no family history of neuropathy.  Of note, she had a left cerebellar stroke in July 2018 secondary to embolic basilar occlusion and absent flow in bilateral SCA and PCAs.  She was treated with high-dose statin and dual antiplatelet therapy. Subsequent MRA from March 2019 shows no residual stenosis.  Out-side paper records, electronic medical record, and images have been reviewed where available and  summarized as:  Lab Results  Component Value Date   TSH 2.932 09/07/2017   Lab Results  Component Value Date   HGBA1C 5.5 11/25/2017   MRA head 10/14/2017:  Negative intracranial MRA. No residual or recurrent distal basilar Stenosis.  Cardiac SPECT 07/06/2017:  Visual and quantitative assessment (grade 3, H/CLL equal 1.94) are strongly suggestive of transthyretin amyloidosis.  MRI/A head 04/22/2017:    Chronic ischemic changes as above.  No acute infarct.  No significant intracranial stenosis on MRA. Previously noted embolic occlusion distal basilar extending into the posterior cerebral artery bilaterally has resolved.  Past Medical History:  Diagnosis Date  . 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 Cancer [facility])    kidney cancer  . Chronic combined systolic and diastolic CHF (congestive heart failure) ([place])   . CKD (chronic kidney disease), stage III ([place])    removal of right kidney due to carcinoma  . Dyspnea   . Headache   . History of kidney cancer    a. s/p right nephrectomy ~2007.  Marland Kitchen History of loop recorder   . Hypercholesterolemia   . Hypertension   . Hypothyroidism   . Mitral regurgitation   . Pulmonary hypertension ([place])   . Stroke [facility])    no residual    Past Surgical History:  Procedure Laterality Date  . BREAST EXCISIONAL BIOPSY Left   . BREAST EXCISIONAL BIOPSY Right   . CARDIAC CATHETERIZATION N/A 01/12/2016   Procedure: Right/Left Heart Cath and Coronary/Graft Angiography;  Surgeon: Belva Crome, MD;  Location: [facility];  Service: Cardiovascular;  Laterality: N/A;  . CERVICAL POLYPECTOMY N/A 04/23/2013   Procedure: CERVICAL POLYPECTOMY;  Surgeon: Maeola Sarah. Landry Mellow, MD;  Location: [facility];  Service: Gynecology;  Laterality: N/A;  Possible Polypectomy  . HYSTEROSCOPY W/D&C N/A 04/23/2013   Procedure: DILATATION AND CURETTAGE /HYSTEROSCOPY;  Surgeon: Maeola Sarah. Landry Mellow, MD;  Location: [facility];  Service: Gynecology;  Laterality: N/A;  . IR ANGIO INTRA EXTRACRAN SEL COM  CAROTID INNOMINATE BILAT MOD SED  02/11/2017  . IR ANGIO VERTEBRAL SEL VERTEBRAL BILAT MOD SED  02/11/2017  . KNEE ARTHROSCOPY Left   . LOOP RECORDER INSERTION N/A 02/18/2017   Procedure: Loop Recorder Insertion;  Surgeon: Constance Haw, MD;  Location: [facility];  Service: Cardiovascular;  Laterality: N/A;  . MITRAL VALVE REPAIR  11/30/2017  . MITRAL VALVE REPAIR N/A 11/30/2017   Procedure: MITRAL VALVE REPAIR;  Surgeon: Sherren Mocha, MD;  Location: [facility];  Service: Cardiovascular;  Laterality: N/A;  . NEPHRECTOMY Right    due to carcinoma  . TEE WITHOUT CARDIOVERSION N/A 02/18/2017   Procedure: TRANSESOPHAGEAL ECHOCARDIOGRAM (TEE) WITH LOOP;  Surgeon: Thayer Headings, MD;  Location: [facility];  Service: Cardiovascular;  Laterality: N/A;     Medications:  Outpatient Encounter Medications as of 12/15/2017  Medication Sig  . acetaminophen (TYLENOL) 500 MG tablet Take 1,000 mg by mouth every 6 (six) hours as needed for headache (pain).  Marland Kitchen aspirin 81 MG tablet Take 1 tablet (81 mg total) daily by mouth.  . butalbital-acetaminophen-caffeine (FIORICET, ESGIC) 50-325-40 MG tablet Take 1 tablet by mouth every 12 (twelve) hours as needed for headache.  . carvedilol (COREG) 6.25 MG tablet Take 0.5 tablets (3.125 mg total) by mouth 2 (two) times daily.  . clopidogrel (PLAVIX) 75 MG tablet Take 1 tablet (75 mg total) by mouth daily.  . feeding supplement, ENSURE ENLIVE, (ENSURE ENLIVE) LIQD Take 237 mLs by mouth 2 (two) times daily between meals.  . furosemide (LASIX) 80 MG tablet Take 1 tablet (80 mg total) by mouth 2 (two) times daily.  . Hypromellose (ARTIFICIAL TEARS OP) Place 1 drop into both eyes daily as needed (dry eyes).  Marland Kitchen levothyroxine (SYNTHROID, LEVOTHROID) 25 MCG tablet Take 25 mcg by mouth daily before breakfast.  . potassium chloride SA (K-DUR,KLOR-CON) 20 MEQ tablet Take 1 tablet (20 mEq total) by mouth 2 (two) times daily.  . pravastatin (PRAVACHOL) 80  MG tablet Take 1 tablet (80 mg total) by mouth at bedtime.   No facility-administered encounter medications on file as of 12/15/2017.      Allergies:  Allergies  Allergen Reactions  . Tramadol Shortness Of Breath and Palpitations    Family History: Family History  Problem Relation Age of Onset  . Stroke Brother   . Diabetes Mellitus II Sister   . Breast cancer Sister   . Colon cancer Sister     Social History: Social History   Tobacco Use  . Smoking status: Never Smoker  . Smokeless tobacco: Never Used  Substance Use Topics  . Alcohol use: No  . Drug use: No   Social History   Social History Narrative   Lives alone in a one story home.  Her son stays with her some.  Has 5 children, 10 grandchildren and 10 great grandchildren.  Retired Training and development officer at [employer].  Education: high school.      Review of Systems:  CONSTITUTIONAL: No fevers, chills, night sweats, or weight loss.   EYES: No visual changes or eye pain ENT: No hearing changes.  No history of nose bleeds.   RESPIRATORY: No cough, wheezing +  shortness of breath.   CARDIOVASCULAR: Negative for chest pain, and palpitations.   GI: Negative for abdominal discomfort, blood in stools or black stools.  No recent change in bowel habits.   GU:  No history of incontinence.   MUSCLOSKELETAL: +history of joint pain or swelling.  No myalgias.   SKIN: Negative for lesions, rash, and itching.   HEMATOLOGY/ONCOLOGY: Negative for prolonged bleeding, bruising easily, and swollen nodes.  +history of cancer.   ENDOCRINE: Negative for cold or heat intolerance, polydipsia or goiter.   PSYCH:  No depression or anxiety symptoms.   NEURO: As Above.   Vital Signs:  BP 110/68   Pulse 71   Ht 5\' 3"  (1.6 m)   Wt 142 lb 8 oz (64.6 kg)   LMP 02/12/2013   SpO2 98%   BMI 25.24 kg/m    General Medical Exam:   General:  Well appearing, comfortable.   Eyes/ENT: see cranial nerve examination.   Neck: No masses appreciated.  Full  range of motion without tenderness.  No carotid bruits. Respiratory:  Clear to auscultation, good air entry bilaterally.   Cardiac:  Regular rate and rhythm, no murmur.   Extremities:  No deformities, edema, or skin discoloration.  Skin:  No rashes or lesions.  Neurological Exam: MENTAL STATUS including orientation to time, place, person, recent and remote memory, attention span and concentration, language, and fund of knowledge is normal.  Speech is not dysarthric.  CRANIAL NERVES: II:  No visual field defects.  Unremarkable fundi.   III-IV-VI: Pupils equal round and reactive to light.  Normal conjugate, extra-ocular eye movements in all directions of gaze.  No nystagmus.  No ptosis.   V:  Normal facial sensation.     VII:  Normal facial symmetry and movements VIII:  Normal hearing and vestibular function.   IX-X:  Normal palatal movement.   XI:  Normal shoulder shrug and head rotation.   XII:  Normal tongue strength and range of motion, no deviation or fasciculation.  MOTOR:  No atrophy, fasciculations or abnormal movements.  No pronator drift.  Tone is normal.    Right Upper Extremity:    Left Upper Extremity:    Deltoid  5/5   Deltoid  5/5   Biceps  5/5   Biceps  5/5   Triceps  5/5   Triceps  5/5   Wrist extensors  5/5   Wrist extensors  5/5   Wrist flexors  5/5   Wrist flexors  5/5   Finger extensors  5/5   Finger extensors  5/5   Finger flexors  5/5   Finger flexors  5/5   Dorsal interossei  5-/5   Dorsal interossei  5-/5   Abductor pollicis  5/5   Abductor pollicis  5/5   Tone (Ashworth scale)  0  Tone (Ashworth scale)  0   Right Lower Extremity:    Left Lower Extremity:    Hip flexors  5/5   Hip flexors  5/5   Hip extensors  5/5   Hip extensors  5/5   Knee flexors  5/5   Knee flexors  5/5   Knee extensors  5/5   Knee extensors  5/5   Dorsiflexors  5/5   Dorsiflexors  5/5   Plantarflexors  5/5   Plantarflexors  5/5   Toe extensors  5/5   Toe extensors  5/5   Toe  flexors  5/5   Toe flexors  5/5   Tone (Ashworth scale)  0  Tone (Ashworth scale)  0   MSRs:  Right                                                                 Left brachioradialis 2+  brachioradialis 2+  biceps 2+  biceps 2+  triceps 2+  triceps 2+  patellar 2+  patellar 2+  ankle jerk 1+  ankle jerk 1+  Hoffman no  Hoffman no  plantar response down  plantar response down   SENSORY:  Vibration is mildly reduced at the great toe bilaterally Pin prick, temperature, and proprioception is intact.Sensation to all modalities intact in the upper extremities  Romberg's sign absent.   COORDINATION/GAIT: Normal finger-to- nose-finger.  Intact rapid alternating movements bilaterally.  Able to rise from a chair without using arms.  Gait narrow based and stable. Mild unsteadiness with tandem gait.   IMPRESSION: Ms. Been is a 72 year old female with history of bilateral carpal tunnel release, left cerebellar embolic stroke (1607), and hereditary ATTR cardiac amyloidosis referred for evaluation of neuropathy. 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  She does endorse subjective paresthesia of the feet which may be too early to detect on electrodiagnostic testing.She does not have history of diabetes or alcohol abuse.  A few additional neuropathy labs including vitamin B-12, folate, and copper level will be checked. If these are unremarkable, it is reasonable to conclude her neuropathy is due to amyloidosis. Her polyneuropathy disability (PND) score in stage I.  Recommend treatment with patisiran.  At this time, her paresthesias are not bothersome enough to start medications for neuropathic pain.    Return to clinic in 4 months   Thank you for allowing me to participate in patient's care.  If I can answer any additional questions, I would be pleased to do so.    Sincerely,    Yaw Escoto  K. Posey Pronto, DO

## 2017-12-16 NOTE — Progress Notes (Signed)
Patisiran treatment recommended by neurology, can we start now?

## 2017-12-17 LAB — COPPER, SERUM: Copper: 111 ug/dL (ref 70–175)

## 2017-12-19 LAB — CUP PACEART REMOTE DEVICE CHECK
Date Time Interrogation Session: 20190423003519
Implantable Pulse Generator Implant Date: 20180720

## 2017-12-20 ENCOUNTER — Telehealth: Payer: Self-pay | Admitting: *Deleted

## 2017-12-20 NOTE — Telephone Encounter (Signed)
Patient given results

## 2017-12-20 NOTE — Telephone Encounter (Signed)
-----   Message from Alda Berthold, DO sent at 12/19/2017  8:16 AM EDT ----- Please notify patient lab are within normal limits.  Thank you.

## 2017-12-27 ENCOUNTER — Ambulatory Visit (INDEPENDENT_AMBULATORY_CARE_PROVIDER_SITE_OTHER): Payer: Medicare Other | Admitting: *Deleted

## 2017-12-27 DIAGNOSIS — I639 Cerebral infarction, unspecified: Secondary | ICD-10-CM | POA: Diagnosis not present

## 2017-12-27 NOTE — Progress Notes (Signed)
Carelink Summary Report / Loop Recorder 

## 2017-12-28 ENCOUNTER — Other Ambulatory Visit: Payer: Self-pay

## 2017-12-28 NOTE — Patient Outreach (Signed)
[facility]) Care Management  12/28/2017  Katie [name] 1945/12/06 [id number]   [facility] will close close the case due to the patient was discharged from the hospital for mitral clip.   Plan: The patient  referred to community.  Lazaro Arms RN, BSN, [place] Direct Dial:  [phone]  Fax: [phone]

## 2018-01-01 ENCOUNTER — Encounter: Payer: Self-pay | Admitting: *Deleted

## 2018-01-03 NOTE — Progress Notes (Signed)
[facility] [place]                                       Cardiology Office Note    Date:  01/04/2018   ID:  CHALISE PE, DOB May 17, 1946, MRN [id number]  PCP:  Seward Carol, MD  Cardiologist:  Dr. [name] / Dr. Burt Knack (MitraClip)  CC: 1 month follow up s/p MitraClip  History of Present Illness:  Katie [name] is a 72 y.o. female with a history of amyloidosis, chronic diastolic CHF, CVA, CKD and severe mitral regurgitation s/p MitralClip (11/30/17) who presents to clinic for follow up.   Patient was initially admitted with diastolic CHF in 4/27. Echo showed EF 60-65% with grade 3 diastolic dysfunction and moderate MR.  She had cath showing no coronary disease. 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  TEE this admission showed moderate-severe MR.  She was admitted again in 10/18 with acute on chronic diastolic CHF. Echo this admission showed EF 50-55%, severe LVH, restrictive diastolic dysfunction, severe MR, moderate-severe TR.  She was referred to Dr. [name] who saw her for mitral valve evaluation.  Upon review of studies, regurgitation thought to be more in the moderate range and likely functional.   Patient had PYP amyloid scan in 12/18, this was suggestive of transthyretin amyloidosis. 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   She was admitted for A/C diastolic HF 0/6-2/37/6283. Required IV diuresis, then transitioned to 80 mg PO lasix BID. Metop was DC'd due to low blood pressures. Cardiac MRI strongly suggestive of amyloidosis, moderate to severe MR (moderate by regurgitant fraction 41% but severe visually). Echo repeated: EF 40-45%, moderate to severe LVH, severe MR, severe dilation of LA and RA, moderate TR, PASP 49.  She had Mitraclip x 2 placed in 11/30/17. Repeat echo post-mitraclip showed EF 55-60%, moderate LVH, mean gradient 3 mmHg across the MV with trace-mild MR. She was seen  in the CHF clinic for follow up on 12/06/17 and was feeling better in terms of he breathing.   Today she presents to clinic for follow up. She has been feeling better since surgery. Her breathing is significantly improved. She is able to get out and shop with no issues. She is able to sweep the floor with no trouble breathing but does get fatigued. She still loves to cook. She says she doesn't let much "get her down." She occasionally gets a fullness in her chest with palpitations. She also gets lightheaded when she stands up from sitting but no syncope. No LE edema, orthopnea or PND. No blood in stool or urine.   Past Medical History:  Diagnosis Date  . Amyloid heart disease ([place])   . Cancer [facility])    kidney cancer  . Chronic combined systolic and diastolic CHF (congestive heart failure) ([place])   . 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 Dyspnea   . Headache   . History of kidney cancer    a. s/p right nephrectomy ~2007.  Marland Kitchen History of loop recorder   . Hypercholesterolemia   . Hypertension   . Hypothyroidism   . Mitral regurgitation   . Pulmonary hypertension ([place])   . Stroke [facility])    no residual    Past Surgical History:  Procedure Laterality Date  . BREAST EXCISIONAL BIOPSY Left   . BREAST EXCISIONAL BIOPSY Right   . CARDIAC CATHETERIZATION N/A 01/12/2016   Procedure: Right/Left Heart Cath and Coronary/Graft Angiography;  Surgeon: Belva Crome, MD;  Location: [place] [facility];  Service: Cardiovascular;  Laterality: N/A;  . CERVICAL POLYPECTOMY N/A 04/23/2013   Procedure: CERVICAL POLYPECTOMY;  Surgeon: Maeola Sarah. Landry Mellow, MD;  Location: [facility];  Service: Gynecology;  Laterality: N/A;  Possible Polypectomy  . HYSTEROSCOPY W/D&C N/A 04/23/2013   Procedure: DILATATION AND CURETTAGE /HYSTEROSCOPY;  Surgeon: Maeola Sarah. Landry Mellow, MD;  Location: [facility];  Service: Gynecology;  Laterality: N/A;  . IR ANGIO INTRA EXTRACRAN SEL COM CAROTID INNOMINATE  BILAT MOD SED  02/11/2017  . IR ANGIO VERTEBRAL SEL VERTEBRAL BILAT MOD SED  02/11/2017  . KNEE ARTHROSCOPY Left   . LOOP RECORDER INSERTION N/A 02/18/2017   Procedure: Loop Recorder Insertion;  Surgeon: Constance Haw, MD;  Location: [place] [facility];  Service: Cardiovascular;  Laterality: N/A;  . MITRAL VALVE REPAIR  11/30/2017  . MITRAL VALVE REPAIR N/A 11/30/2017   Procedure: MITRAL VALVE REPAIR;  Surgeon: Sherren Mocha, MD;  Location: [place] [facility];  Service: Cardiovascular;  Laterality: N/A;  . NEPHRECTOMY Right    due to carcinoma  . TEE WITHOUT CARDIOVERSION N/A 02/18/2017   Procedure: TRANSESOPHAGEAL ECHOCARDIOGRAM (TEE) WITH LOOP;  Surgeon: Acie Fredrickson Wonda Cheng, MD;  Location: [facility] ENDOSCOPY;  Service: Cardiovascular;  Laterality: N/A;    Current Medications: Outpatient Medications Prior to Visit  Medication Sig Dispense Refill  . acetaminophen (TYLENOL) 500 MG tablet Take 1,000 mg by mouth every 6 (six) hours as needed for headache (pain).    Marland Kitchen aspirin 81 MG tablet Take 1 tablet (81 mg total) daily by mouth.    . butalbital-acetaminophen-caffeine (FIORICET, ESGIC) 50-325-40 MG tablet Take 1 tablet by mouth every 12 (twelve) hours as needed for headache. 14 tablet 0  . carvedilol (COREG) 6.25 MG tablet Take 0.5 tablets (3.125 mg total) by mouth 2 (two) times daily. 180 tablet 0  . clopidogrel (PLAVIX) 75 MG tablet Take 1 tablet (75 mg total) by mouth daily. 30 tablet 1  . feeding supplement, ENSURE ENLIVE, (ENSURE ENLIVE) LIQD Take 237 mLs by mouth 2 (two) times daily between meals. 237 mL 0  . furosemide (LASIX) 80 MG tablet Take 1 tablet (80 mg total) by mouth 2 (two) times daily. 60 tablet 0  . Hypromellose (ARTIFICIAL TEARS OP) Place 1 drop into both eyes daily as needed (dry eyes).    Marland Kitchen levothyroxine (SYNTHROID, LEVOTHROID) 25 MCG tablet Take 25 mcg by mouth daily before breakfast.    . potassium chloride SA (K-DUR,KLOR-CON) 20 MEQ tablet Take 1 tablet (20 mEq total) by  mouth 2 (two) times daily. 60 tablet 0  . pravastatin (PRAVACHOL) 80 MG tablet Take 1 tablet (80 mg total) by mouth at bedtime. 30 tablet 11   No facility-administered medications prior to visit.      Allergies:   Tramadol   Social History   Socioeconomic History  . Marital status: Divorced    Spouse name: Not on file  . Number of children: 5  . Years of education: 46  . Highest education level: Not on file  Occupational History  . Occupation: retired    Fish farm manager: [employer]  . Financial resource strain: Not on file  . Food insecurity:    Worry: Not on file    Inability: Not on file  .  Transportation needs:    Medical: Not on file    Non-medical: Not on file  Tobacco Use  . Smoking status: Never Smoker  . Smokeless tobacco: Never Used  Substance and Sexual Activity  . Alcohol use: No  . Drug use: No  . Sexual activity: Never  Lifestyle  . Physical activity:    Days per week: Not on file    Minutes per session: Not on file  . Stress: Not on file  Relationships  . Social connections:    Talks on phone: Not on file    Gets together: Not on file    Attends religious service: Not on file    Active member of club or organization: Not on file    Attends meetings of clubs or organizations: Not on file    Relationship status: Not on file  Other Topics Concern  . Not on file  Social History Narrative   Lives alone in a one story home.  Her son stays with her some.  Has 5 children, 10 grandchildren and 10 great grandchildren.  Retired Training and development officer at [employer].  Education: high school.       Family History:  The patient's family history includes Breast cancer in her sister; Colon cancer in her sister; Diabetes Mellitus II in her sister; Stroke in her brother.      ROS:   Please see the history of present illness.    ROS All other systems reviewed and are negative.   PHYSICAL EXAM:   VS:  BP 122/78   Pulse 73   Ht 5\' 3"  (1.6 m)   Wt 144 lb 4 oz  (65.4 kg)   LMP 02/12/2013   SpO2 99%   BMI 25.55 kg/m    GEN: Well nourished, well developed, in no acute distress  HEENT: normal  Neck: no JVD, carotid bruits, or masses Cardiac: RRR; no murmurs, rubs, or gallops,no edema  Respiratory:  clear to auscultation bilaterally, normal work of breathing GI: soft, nontender, nondistended, + BS MS: no deformity or atrophy  Skin: warm and dry, no rash Neuro:  Alert and Oriented x 3, Strength and sensation are intact Psych: euthymic mood, full affect   Wt Readings from Last 3 Encounters:  01/04/18 144 lb 4 oz (65.4 kg)  12/15/17 142 lb 8 oz (64.6 kg)  12/12/17 142 lb (64.4 kg)      Studies/Labs Reviewed:   EKG:  EKG is NOT ordered today.   Recent Labs: 05/11/2017: Magnesium 2.1 09/07/2017: TSH 2.932 11/25/2017: ALT 17; B Natriuretic Peptide 240.0 12/01/2017: BUN 21; Creatinine, Ser 1.40; Hemoglobin 10.4; Platelets 202; Potassium 4.1; Sodium 141   Lipid Panel    Component Value Date/Time   CHOL 126 09/08/2017 0356   TRIG 24 09/08/2017 0356   HDL 52 09/08/2017 0356   CHOLHDL 2.4 09/08/2017 0356   VLDL 5 09/08/2017 0356   LDLCALC 69 09/08/2017 0356    Additional studies/ records that were reviewed today include:  2D ECHO 12/01/17 Study Conclusions - Left ventricle: The cavity size was normal. Wall thickness was   increased in a pattern of moderate LVH. There was severe focal   basal hypertrophy of the septum. 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 Systolic   function was normal. The estimated ejection fraction was in the   range of 55% to 60%. LV mid ventricle false tendon. Diffuse   hypokinesis. The study is not technically sufficient to allow   evaluation of LV  diastolic function. - Aortic valve: Trileaflet. Sclerosis without stenosis. There was   no regurgitation. - Mitral valve: Leaflet thickening. Echogenic MitraClips (x2) noted   centrally, coapting the mitral leaflets. No  significant stenosis   (mean gradient 3 mmHg) at HR of 79. Trace to mild regurgitation.   Valve area by continuity equation (using LVOT flow): 1.24 cm^2. - Left atrium: Severely dilated. - Right atrium: Moderately dilated. - Tricuspid valve: There was moderate regurgitation. - Pulmonary arteries: PA peak pressure: 62 mm Hg (S). - Inferior vena cava: The vessel was dilated. The respirophasic   diameter changes were blunted (< 50%), consistent with elevated   central venous pressure. Impressions: - Compared to a prior study on 5/1, the LVEF is stable. The   mitraclips are stable and there is trace to mild MR.  ________________  2D ECHO 01/04/18 ( 30 days post MitraClip) - Left ventricle: LV myocardium has a speckled appearence Wall   thickness was increased in a pattern of moderate LVH. Systolic   function was normal. The estimated ejection fraction was in the   range of 55% to 60%. The study is not technically sufficient to   allow evaluation of LV diastolic function. - Mitral valve: Patient is s/p mitral clip procedure. There are 2   clips side by side creating a double oriface between the A2/P2   scallops. There is trivial residual MR   with stable mean gradient in diastole of 2 mmHg and peak 8 mmHg   at a HR of 69 bpm. MVA by pressure half time 2.3 cm2. Valve area   by pressure half-time: 2.29 cm^2. Valve area by continuity   equation (using LVOT flow): 1.05 cm^2. - Left atrium: The atrium was moderately dilated. - Right atrium: The atrium was moderately dilated. - Atrial septum: No residual PFO/ASD noted on limited images post   transeptal No defect or patent foramen ovale was identified. - Tricuspid valve: There was mild-moderate regurgitation. - Pulmonary arteries: PA peak pressure: 61 mm Hg (S).    ASSESSMENT & PLAN:   Severe MR s/p MitraClip: 2D ECHO today shows EF 55% with trivial MR with 2 clips side by side creating a double oriface between the A2/P2 scallops, mean  gradient 2 mm Hg and MVA  2.3 cm2. She has NYHA class II symptoms, mainly of fatigue. Continue on ASA and plavix. Plavix can be discontinued after 6 months of therapy. SBE prophylaxis discussed, I have RX'd amoxicillin. I will see her back in 1 year with an echo and PT assessment  Chronic diastolic CHF: she appears euvolemic. Followed closely in the advanced CHF clinic by Dr. [name]  CKD stage III: this has remained stable; creat baseline 1.2-1.4  Hx of CVA: has a Linq in place with no afib detected so far. Continue antiplatelet therapy.    Medication Adjustments/Labs and Tests Ordered: Current medicines are reviewed at length with the patient today.  Concerns regarding medicines are outlined above.  Medication changes, Labs and Tests ordered today are listed in the Patient Instructions below. Patient Instructions  Medication Instructions:  Your provider recommends that you continue on your current medications as directed. Please refer to the Current Medication list given to you today.    Your physician discussed the importance of taking an antibiotic prior to any dental, gastrointestinal, genitourinary procedures to prevent damage to the heart valves from infection. You have been called in AMOXIL 2,000mg  to take one hour prior to dental procedures.  Labwork: None  Testing/Procedures: Your  provider has requested that you have an echocardiogram in 1 year. Echocardiography is a painless test that uses sound waves to create images of your heart. It provides your doctor with information about the size and shape of your heart and how well your heart's chambers and valves are working. This procedure takes approximately one hour. There are no restrictions for this procedure.  Follow-Up: Your provider recommends that you schedule a follow-up appointment in 1 YEAR with the Structural Heart Team.    Any Other Special Instructions Will Be Listed Below (If Applicable).     If you need a refill on  your cardiac medications before your next appointment, please call your pharmacy.      Signed, Angelena Form, PA-C  01/04/2018 4:08 PM    [facility] [address], [place], [place]  [address] Phone: [phone]; Fax: [phone]

## 2018-01-04 ENCOUNTER — Encounter: Payer: Self-pay | Admitting: Physician Assistant

## 2018-01-04 ENCOUNTER — Encounter (INDEPENDENT_AMBULATORY_CARE_PROVIDER_SITE_OTHER): Payer: Self-pay

## 2018-01-04 ENCOUNTER — Ambulatory Visit (INDEPENDENT_AMBULATORY_CARE_PROVIDER_SITE_OTHER): Payer: Medicare Other | Admitting: Physician Assistant

## 2018-01-04 ENCOUNTER — Other Ambulatory Visit: Payer: Self-pay

## 2018-01-04 ENCOUNTER — Ambulatory Visit (HOSPITAL_COMMUNITY): Payer: Medicare Other | Attending: Cardiovascular Disease

## 2018-01-04 ENCOUNTER — Other Ambulatory Visit: Payer: Self-pay | Admitting: *Deleted

## 2018-01-04 VITALS — BP 122/78 | HR 73 | Ht 63.0 in | Wt 144.2 lb

## 2018-01-04 DIAGNOSIS — I340 Nonrheumatic mitral (valve) insufficiency: Secondary | ICD-10-CM

## 2018-01-04 DIAGNOSIS — Z8673 Personal history of transient ischemic attack (TIA), and cerebral infarction without residual deficits: Secondary | ICD-10-CM | POA: Diagnosis not present

## 2018-01-04 DIAGNOSIS — E785 Hyperlipidemia, unspecified: Secondary | ICD-10-CM | POA: Diagnosis not present

## 2018-01-04 DIAGNOSIS — I509 Heart failure, unspecified: Secondary | ICD-10-CM | POA: Insufficient documentation

## 2018-01-04 DIAGNOSIS — I361 Nonrheumatic tricuspid (valve) insufficiency: Secondary | ICD-10-CM | POA: Insufficient documentation

## 2018-01-04 DIAGNOSIS — I272 Pulmonary hypertension, unspecified: Secondary | ICD-10-CM | POA: Insufficient documentation

## 2018-01-04 DIAGNOSIS — Z9889 Other specified postprocedural states: Secondary | ICD-10-CM | POA: Diagnosis present

## 2018-01-04 DIAGNOSIS — I5032 Chronic diastolic (congestive) heart failure: Secondary | ICD-10-CM | POA: Diagnosis not present

## 2018-01-04 DIAGNOSIS — I110 Hypertensive heart disease with heart failure: Secondary | ICD-10-CM | POA: Diagnosis not present

## 2018-01-04 LAB — ECHOCARDIOGRAM COMPLETE: Height: 63 in

## 2018-01-04 MED ORDER — AMOXICILLIN 500 MG PO TABS: ORAL_TABLET | ORAL | 6 refills | Status: DC

## 2018-01-04 NOTE — Patient Instructions (Signed)
Medication Instructions:  Your provider recommends that you continue on your current medications as directed. Please refer to the Current Medication list given to you today.    Your physician discussed the importance of taking an antibiotic prior to any dental, gastrointestinal, genitourinary procedures to prevent damage to the heart valves from infection. You have been called in AMOXIL 2,000mg  to take one hour prior to dental procedures.  Labwork: None  Testing/Procedures: Your provider has requested that you have an echocardiogram in 1 year. Echocardiography is a painless test that uses sound waves to create images of your heart. It provides your doctor with information about the size and shape of your heart and how well your heart's chambers and valves are working. This procedure takes approximately one hour. There are no restrictions for this procedure.  Follow-Up: Your provider recommends that you schedule a follow-up appointment in 1 YEAR with the Structural Heart Team.    Any Other Special Instructions Will Be Listed Below (If Applicable).     If you need a refill on your cardiac medications before your next appointment, please call your pharmacy.

## 2018-01-04 NOTE — Patient Outreach (Signed)
[facility]) Care Management  01/04/2018  Katie [name] [birth year] [id number]   Call placed to member to follow up on current health condition and to inquire about concerns regarding procedure tomorrow.  She state she read over the booklet again and feel she is prepared, denies questions.  Also state she has appointment with cardiologist today.  Unsure how long she will be in hospital, this care manager will follow up after discharge.  [name], [place], MSN [place] [phone]

## 2018-01-05 ENCOUNTER — Ambulatory Visit (HOSPITAL_COMMUNITY)
Admission: RE | Admit: 2018-01-05 | Discharge: 2018-01-05 | Disposition: A | Discharge status: Home / Self Care | Payer: Medicare Other | Source: Ambulatory Visit | Attending: Cardiology | Admitting: Cardiology

## 2018-01-05 ENCOUNTER — Encounter (HOSPITAL_COMMUNITY)
Admission: RE | Disposition: A | Discharge status: Home / Self Care | Payer: Self-pay | Source: Ambulatory Visit | Attending: Cardiology

## 2018-01-05 DIAGNOSIS — I130 Hypertensive heart and chronic kidney disease with heart failure and stage 1 through stage 4 chronic kidney disease, or unspecified chronic kidney disease: Secondary | ICD-10-CM | POA: Diagnosis present

## 2018-01-05 DIAGNOSIS — Z905 Acquired absence of kidney: Secondary | ICD-10-CM | POA: Insufficient documentation

## 2018-01-05 DIAGNOSIS — E785 Hyperlipidemia, unspecified: Secondary | ICD-10-CM | POA: Insufficient documentation

## 2018-01-05 DIAGNOSIS — I5032 Chronic diastolic (congestive) heart failure: Secondary | ICD-10-CM | POA: Diagnosis not present

## 2018-01-05 DIAGNOSIS — I43 Cardiomyopathy in diseases classified elsewhere: Secondary | ICD-10-CM | POA: Diagnosis not present

## 2018-01-05 DIAGNOSIS — G629 Polyneuropathy, unspecified: Secondary | ICD-10-CM | POA: Insufficient documentation

## 2018-01-05 DIAGNOSIS — E039 Hypothyroidism, unspecified: Secondary | ICD-10-CM | POA: Insufficient documentation

## 2018-01-05 DIAGNOSIS — Z85528 Personal history of other malignant neoplasm of kidney: Secondary | ICD-10-CM | POA: Insufficient documentation

## 2018-01-05 DIAGNOSIS — Z7902 Long term (current) use of antithrombotics/antiplatelets: Secondary | ICD-10-CM | POA: Insufficient documentation

## 2018-01-05 DIAGNOSIS — Z8673 Personal history of transient ischemic attack (TIA), and cerebral infarction without residual deficits: Secondary | ICD-10-CM | POA: Insufficient documentation

## 2018-01-05 DIAGNOSIS — I340 Nonrheumatic mitral (valve) insufficiency: Secondary | ICD-10-CM | POA: Insufficient documentation

## 2018-01-05 DIAGNOSIS — N183 Chronic kidney disease, stage 3 (moderate): Secondary | ICD-10-CM | POA: Insufficient documentation

## 2018-01-05 DIAGNOSIS — E854 Organ-limited amyloidosis: Secondary | ICD-10-CM | POA: Diagnosis not present

## 2018-01-05 DIAGNOSIS — Z7982 Long term (current) use of aspirin: Secondary | ICD-10-CM | POA: Diagnosis not present

## 2018-01-05 DIAGNOSIS — I509 Heart failure, unspecified: Secondary | ICD-10-CM

## 2018-01-05 LAB — CBC
HCT: 37.5 % (ref 36.0–46.0)
Hemoglobin: 12.4 g/dL (ref 12.0–15.0)
MCH: 31 pg (ref 26.0–34.0)
MCHC: 33.1 g/dL (ref 30.0–36.0)
MCV: 93.8 fL (ref 78.0–100.0)
Platelets: 264 10*3/uL (ref 150–400)
RBC: 4 MIL/uL (ref 3.87–5.11)
RDW: 12.9 % (ref 11.5–15.5)
WBC: 4 10*3/uL (ref 4.0–10.5)

## 2018-01-05 LAB — POCT I-STAT 3, VENOUS BLOOD GAS (G3P V)
Acid-Base Excess: 3 mmol/L — ABNORMAL HIGH (ref 0.0–2.0)
Bicarbonate: 28.8 mmol/L — ABNORMAL HIGH (ref 20.0–28.0)
O2 Saturation: 71 %
TCO2: 30 mmol/L (ref 22–32)
pCO2, Ven: 48.8 mmHg (ref 44.0–60.0)
pH, Ven: 7.378 (ref 7.250–7.430)
pO2, Ven: 39 mmHg (ref 32.0–45.0)

## 2018-01-05 LAB — BASIC METABOLIC PANEL
Anion gap: 8 (ref 5–15)
BUN: 18 mg/dL (ref 6–20)
CO2: 31 mmol/L (ref 22–32)
Calcium: 9.9 mg/dL (ref 8.9–10.3)
Chloride: 106 mmol/L (ref 101–111)
Creatinine, Ser: 1.41 mg/dL — ABNORMAL HIGH (ref 0.44–1.00)
GFR calc Af Amer: 42 mL/min — ABNORMAL LOW (ref >60)
GFR calc non Af Amer: 36 mL/min — ABNORMAL LOW (ref >60)
Glucose, Bld: 100 mg/dL — ABNORMAL HIGH (ref 65–99)
Potassium: 4.2 mmol/L (ref 3.5–5.1)
Sodium: 145 mmol/L (ref 135–145)

## 2018-01-05 LAB — PROTIME-INR
INR: 1.07
Prothrombin Time: 13.8 seconds (ref 11.4–15.2)

## 2018-01-05 SURGERY — PRESSURE SENSOR/CARDIOMEMS
Anesthesia: LOCAL

## 2018-01-05 MED ORDER — ONDANSETRON HCL 4 MG/2ML IJ SOLN: 4.0000 mg | Freq: Four times a day (QID) | INTRAMUSCULAR | Status: DC | PRN

## 2018-01-05 MED ORDER — SODIUM CHLORIDE 0.9 % IV SOLN: 250.0000 mL | INTRAVENOUS | Status: DC | PRN

## 2018-01-05 MED ORDER — SODIUM CHLORIDE 0.9% FLUSH: 3.0000 mL | Freq: Two times a day (BID) | INTRAVENOUS | Status: DC

## 2018-01-05 MED ORDER — LIDOCAINE HCL (PF) 1 % IJ SOLN
INTRAMUSCULAR | Status: AC
  Filled 2018-01-05: qty 30

## 2018-01-05 MED ORDER — HEPARIN (PORCINE) IN NACL 2-0.9 UNITS/ML
INTRAMUSCULAR | Status: AC | PRN
  Administered 2018-01-05 (×3): 500 mL

## 2018-01-05 MED ORDER — HEPARIN (PORCINE) IN NACL 1000-0.9 UT/500ML-% IV SOLN
INTRAVENOUS | Status: AC
  Filled 2018-01-05: qty 500

## 2018-01-05 MED ORDER — SODIUM CHLORIDE 0.9% FLUSH: 3.0000 mL | INTRAVENOUS | Status: DC | PRN

## 2018-01-05 MED ORDER — FENTANYL CITRATE (PF) 100 MCG/2ML IJ SOLN
INTRAMUSCULAR | Status: DC | PRN
  Administered 2018-01-05 (×2): 25 ug via INTRAVENOUS

## 2018-01-05 MED ORDER — FENTANYL CITRATE (PF) 100 MCG/2ML IJ SOLN
INTRAMUSCULAR | Status: AC
  Filled 2018-01-05: qty 2

## 2018-01-05 MED ORDER — IOHEXOL 350 MG/ML SOLN
INTRAVENOUS | Status: DC | PRN
  Administered 2018-01-05: 20 mL via INTRAVENOUS

## 2018-01-05 MED ORDER — MIDAZOLAM HCL 2 MG/2ML IJ SOLN
INTRAMUSCULAR | Status: AC
  Filled 2018-01-05: qty 2

## 2018-01-05 MED ORDER — LIDOCAINE HCL (PF) 1 % IJ SOLN
INTRAMUSCULAR | Status: DC | PRN
  Administered 2018-01-05: 20 mL

## 2018-01-05 MED ORDER — MIDAZOLAM HCL 2 MG/2ML IJ SOLN
INTRAMUSCULAR | Status: DC | PRN
  Administered 2018-01-05 (×2): 1 mg via INTRAVENOUS

## 2018-01-05 MED ORDER — ACETAMINOPHEN 325 MG PO TABS: 650.0000 mg | ORAL_TABLET | ORAL | Status: DC | PRN

## 2018-01-05 MED ORDER — HEPARIN (PORCINE) IN NACL 1000-0.9 UT/500ML-% IV SOLN
INTRAVENOUS | Status: AC
  Filled 2018-01-05: qty 1000

## 2018-01-05 MED ORDER — SODIUM CHLORIDE 0.9 % IV SOLN
INTRAVENOUS | Status: DC
  Administered 2018-01-05: 08:00:00 via INTRAVENOUS

## 2018-01-05 SURGICAL SUPPLY — 13 items
CARDIOMEMS PA SENSOR W/DELIVER (Prosthesis & Implant Heart) ×2 IMPLANT
CATH SWAN GANZ 7F STRAIGHT (CATHETERS) ×2 IMPLANT
COVER PRB 48X5XTLSCP FOLD TPE (BAG) ×1 IMPLANT
COVER PROBE 5X48 (BAG) ×1
KIT HEART LEFT (KITS) ×2 IMPLANT
PROTECTION STATION PRESSURIZED (MISCELLANEOUS) ×2
SENSOR CARDIOMEMS PA W/DELIVER (Prosthesis & Implant Heart) ×1 IMPLANT
SHEATH FAST CATH 14F (SHEATH) ×2 IMPLANT
SHEATH PINNACLE 6F 10CM (SHEATH) ×2 IMPLANT
SHEATH PINNACLE 9F 10CM (SHEATH) ×2 IMPLANT
STATION PROTECTION PRESSURIZED (MISCELLANEOUS) ×1 IMPLANT
WIRE EMERALD 3MM-J .025X260CM (WIRE) ×2 IMPLANT
WIRE G V18X300CM (WIRE) ×2 IMPLANT

## 2018-01-05 NOTE — Interval H&P Note (Signed)
History and Physical Interval Note:  01/05/2018 11:24 AM  Katie [name]  has presented today for surgery, with the diagnosis of hf  The various methods of treatment have been discussed with the patient and family. After consideration of risks, benefits and other options for treatment, the patient has consented to  Procedure(s): PRESSURE SENSOR/CARDIOMEMS (N/A) as a surgical intervention .  The patient's history has been reviewed, patient examined, no change in status, stable for surgery.  I have reviewed the patient's chart and labs.  Questions were answered to the patient's satisfaction.     [name] [employer]

## 2018-01-05 NOTE — Progress Notes (Signed)
Right groin 14Fr venous sheath Level 0 upon arrival Manual pressure held for 15 min. Bedrest begins at 1300 Pt remained stable during sheath pull Site is Level 0 post sheath pull Gaze with transparent dressing applied to site Right DP 3+ post sheath pull Post instruction given to patient Pt remains stable and VS are WNL

## 2018-01-05 NOTE — Discharge Instructions (Signed)
° ° °  Discharge Instructions   DRINK PLENTY OF FLUIDS FOR THE NEXT 2-3 DAYS TO KEEP HYDRATED.  Please refer to the following instructions for your post-procedure care. Your surgeon or physician assistant will discuss any changes with you.  Activity  Avoid lifting more than 8 pounds (1 gallons of milk) for 72 hours (3 days) after your procedure. You may walk as much as you can tolerate. It's OK to drive after 72 hours.  Bathing/Showering  You may shower the day after your procedure. If you have a bandage, you may remove it at 24- 48 hours. Clean your incision site with mild soap and water. Pat the area dry with a clean towel.  Diet  Resume your pre-procedure diet. There are no special food restrictions following this procedure. All patients with peripheral vascular disease should follow a low fat/low cholesterol diet. In order to heal from your surgery, it is CRITICAL to get adequate nutrition. Your body requires vitamins, minerals, and protein. Vegetables are the best source of vitamins and minerals. Vegetables also provide the perfect balance of protein. Processed food has little nutritional value, so try to avoid this.  Medications  Resume taking all of your medications unless your doctor tells you not to. If your incision is causing pain, you may take over-the-counter pain relievers such as acetaminophen (Tylenol)  Follow Up  Follow up will be arranged at the time of your procedure. You may have an office visit scheduled or may be scheduled for surgery. Ask your surgeon if you have any questions.  Please call us immediately for any of the following conditions: Severe or worsening pain your legs or feet at rest or with walking. Increased pain, redness, drainage at your groin puncture site. Fever of 101 degrees or higher. If you have any mild or slow bleeding from your puncture site: lie down, apply firm constant pressure over the area with a piece of gauze or a clean wash cloth  for 30 minutes- no peeking!, call 911 right away if you are still bleeding after 30 minutes, or if the bleeding is heavy and unmanageable.  Reduce your risk factors of vascular disease:  Stop smoking. If you would like help call [facility] at 1-800-QUIT-NOW (251)432-7483) or [place] at (534)165-8546. Manage your cholesterol Maintain a desired weight Control your diabetes Keep your blood pressure down  If you have any questions, please call the office at [phone]

## 2018-01-06 ENCOUNTER — Other Ambulatory Visit: Payer: Self-pay | Admitting: *Deleted

## 2018-01-06 NOTE — Patient Outreach (Signed)
[place] [facility]) Care Management  01/06/2018  Katie [name] 04/22/1946 [id number]   Noted member was discharged after procedure yesterday.  Call placed to follow up on current health status.  She report she is doing well, was able to do a reading for her sensor when she arrived home yesterday as well as today.  Denies shortness of breath at this time.  Agrees to home visit next week, denies urgent concerns.  [name], [place], MSN [place] [phone]

## 2018-01-09 ENCOUNTER — Other Ambulatory Visit: Payer: Self-pay | Admitting: *Deleted

## 2018-01-09 NOTE — Patient Outreach (Signed)
[facility]) Care Management   01/09/2018  Katie [name] 08-Feb-1946 [id number]  Katie [name] is an 72 y.o. female  Subjective:   Member alert and oriented x3, denies shortness of breath at this time.  Report she has some soreness at the site of incision after procedure, no bleeding.  Compliant with medications and daily weights.  Objective:   Review of Systems  Constitutional: Negative.   HENT: Negative.   Eyes: Negative.   Respiratory: Negative.   Cardiovascular: Negative.   Gastrointestinal: Negative.   Genitourinary: Negative.   Musculoskeletal: Negative.   Skin: Negative.   Neurological: Negative.   Endo/Heme/Allergies: Negative.   Psychiatric/Behavioral: Negative.     Physical Exam  Constitutional: She is oriented to person, place, and time. She appears well-developed and well-nourished.  Neck: Normal range of motion.  Cardiovascular: Normal rate, regular rhythm and normal heart sounds.  Respiratory: Effort normal and breath sounds normal.  GI: Soft. Bowel sounds are normal.  Musculoskeletal: Normal range of motion.  Neurological: She is alert and oriented to person, place, and time.  Skin: Skin is warm and dry.   BP 110/62 (BP Location: Left Arm, Patient Position: Sitting, Cuff Size: Normal)   Pulse 66   Resp 18   Wt 142 lb (64.4 kg)   LMP 02/12/2013   SpO2 98%   BMI 25.15 kg/m   Encounter Medications:   Outpatient Encounter Medications as of 01/09/2018  Medication Sig  . acetaminophen (TYLENOL) 500 MG tablet Take 1,000 mg by mouth every 6 (six) hours as needed for headache (pain).  Marland Kitchen amoxicillin (AMOXIL) 500 MG tablet Take 4 tablets (2,000 mg) 1 hour prior to dental procedures.  Marland Kitchen aspirin 81 MG tablet Take 1 tablet (81 mg total) daily by mouth.  . butalbital-acetaminophen-caffeine (FIORICET, ESGIC) 50-325-40 MG tablet Take 1 tablet by mouth every 12 (twelve) hours as needed for headache.  . carvedilol (COREG) 6.25 MG tablet Take 0.5 tablets  (3.125 mg total) by mouth 2 (two) times daily.  . clopidogrel (PLAVIX) 75 MG tablet Take 1 tablet (75 mg total) by mouth daily.  . feeding supplement, ENSURE ENLIVE, (ENSURE ENLIVE) LIQD Take 237 mLs by mouth 2 (two) times daily between meals.  . furosemide (LASIX) 80 MG tablet Take 1 tablet (80 mg total) by mouth 2 (two) times daily.  . Hypromellose (ARTIFICIAL TEARS OP) Place 1 drop into both eyes daily as needed (dry eyes).  Marland Kitchen levothyroxine (SYNTHROID, LEVOTHROID) 25 MCG tablet Take 25 mcg by mouth daily before breakfast.  . potassium chloride SA (K-DUR,KLOR-CON) 20 MEQ tablet Take 1 tablet (20 mEq total) by mouth 2 (two) times daily.  . pravastatin (PRAVACHOL) 80 MG tablet Take 1 tablet (80 mg total) by mouth at bedtime.   No facility-administered encounter medications on file as of 01/09/2018.     Functional Status:   In your present state of health, do you have any difficulty performing the following activities: 12/12/2017 11/30/2017  Hearing? N -  Vision? N -  Comment - -  Difficulty concentrating or making decisions? N -  Walking or climbing stairs? Y -  Comment - -  Dressing or bathing? N -  Doing errands, shopping? Tempie Donning  Preparing Food and eating ? N -  Using the Toilet? N -  In the past six months, have you accidently leaked urine? N -  Do you have problems with loss of bowel control? N -  Managing your Medications? N -  Managing your Finances? N -  Housekeeping or managing your Housekeeping? N -  Some recent data might be hidden    Fall/Depression Screening:    Fall Risk  12/15/2017 12/12/2017 11/23/2017  Falls in the past year? No Yes No  Number falls in past yr: - 1 -  Injury with Fall? - No -  Risk for fall due to : - - -  Follow up - Falls prevention discussed -   PHQ 2/9 Scores 12/12/2017 07/28/2017 03/03/2017 02/22/2017  PHQ - 2 Score 0 0 0 0    Assessment:    Met with member at scheduled time.  No distress noted.  Daily weights remain stable, no complications  since cardiomems implanted.  Has been performing daily readings that go directly to MD office.  She is unaware of readings herself, but will continue to monitor daily weights to monitor fluid status.  Follow up appointment with cardiology next month.  Denies any urgent concerns, advised to contact this care manager with questions.    Plan:   Will follow up next month, if remain stable will consider transition to health coach.  [facility] CM Care Plan Problem One     Most Recent Value  Care Plan Problem One  risk for admission regarding treatment of cardiac amyloidosis as evidenced by recent mitral clipping procedure  Role Documenting the Problem One  Care Management [place] for Problem One  Active  [facility] Long Term Goal   In the next 31 days the patient will not be admitted to the hospital as inpatient  [place] Term Goal Start Date  12/12/17  [facility] Long Term Goal Met Date  01/09/18  [facility] CM Short Term Goal #1   Member will report no signs/symptoms of heart failure (elevated weight, sweling, shortness of breath)  [facility] CM Short Term Goal #1 Start Date  12/12/17  [facility] CM Short Term Goal #1 Met Date  01/09/18  [facility] CM Short Term Goal #2   Member will keep and attend all appointments with specialty MDs over the next 4 weeks  [facility] CM Short Term Goal #2 Start Date  12/12/17  [facility] CM Short Term Goal #2 Met Date  01/09/18     [name], RN, MSN [place] [phone]

## 2018-01-10 ENCOUNTER — Encounter: Payer: Self-pay | Admitting: Thoracic Surgery (Cardiothoracic Vascular Surgery)

## 2018-01-11 ENCOUNTER — Encounter (HOSPITAL_COMMUNITY): Payer: Self-pay | Admitting: Cardiovascular Disease

## 2018-01-12 ENCOUNTER — Encounter (HOSPITAL_COMMUNITY): Payer: Self-pay | Admitting: Cardiovascular Disease

## 2018-01-17 LAB — CUP PACEART REMOTE DEVICE CHECK
Date Time Interrogation Session: 20190526014110
Implantable Pulse Generator Implant Date: 20180720

## 2018-01-19 ENCOUNTER — Telehealth (HOSPITAL_COMMUNITY): Payer: Self-pay

## 2018-01-19 NOTE — Telephone Encounter (Signed)
Called pt to see if she would like to proceed with scheduling for CR, she stated yes. Pt will come in for orientation on 02/16/18 @ 7:45 AM and will attend the 6:45 AM exer. Class.  Mailed out homework package.

## 2018-01-26 ENCOUNTER — Ambulatory Visit (INDEPENDENT_AMBULATORY_CARE_PROVIDER_SITE_OTHER): Payer: Medicare Other | Admitting: *Deleted

## 2018-01-26 ENCOUNTER — Encounter: Payer: Medicare Other | Admitting: Genetic Counselor

## 2018-01-26 DIAGNOSIS — I639 Cerebral infarction, unspecified: Secondary | ICD-10-CM | POA: Diagnosis not present

## 2018-01-27 NOTE — Progress Notes (Signed)
Carelink Summary Report / Loop Recorder 

## 2018-02-01 NOTE — Progress Notes (Signed)
[facility]  PATIENT: Katie [name] DOB: [birth year]   REASON FOR VISIT: Follow-up of stroke HISTORY FROM: Patient    HISTORY OF PRESENT ILLNESS: 9/24/18PSMs [name] is a 61 year african american lady seen today for first office f/u visit after [facility] admission for stroke in July 2018.Katie Weatherholtz Deanis an 72 y.o.femalewith history of CHF, renal cancer s/p R nephrectomy 2007, stage III CKD, HTN, HLD, and just discharged on 02/12/17 for strokewho was admitted again for CP. She was found to have acute on chronic heart failure and was treated for such. However, pt continues to complain of HA. Neurology was consulted for HA management.Pt was admitted from 02/10/17 to 02/12/17 for HA. Stroke workup found acute infarct affecting the superior cerebellum on the left. 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 TTE showed no cardiac source of emboli however moderate pulmonary hypertension was found. LDL 88, A1c 5.6. She was put on dual antiplatelet for 3 months and then Plavix alone. She was also continued on pravastatin 80 mg daily. She had outpatient transesophageal echocardiogram which was negative for cardiac source of embolism and had loop recorder placed and so for atrial fibrillation has not yet been found.. During the admission, she continued complaining of headache, but gradually getting better at discharge, although not resolved. Since discharge, she continued to have intermittent headache. She states the headaches and now much less in about once a week or so. She is complaining of some swelling in the legs and weight gain which may be related to Depakote. She denies any focal neurological symptoms. She states her blood pressure is well controlled and today it is 115/83. She is starting protocol well without muscle aches and pains. She remains on aspirin and Plavix which is tolerating well but denies significant  bruising or bleeding. She has an upcoming appointment to see Dr. Ricard Dillon to discuss heart valve surgery. She had follow-up MRI scan of the brain done on 04/22/17 which I personally reviewed shows old left cerebellar and left parietal infarcts without acute finding. MRA of the brain shows complete recanalization of the previously occluded distal basilar artery indicating that the previous occlusion with embolic and not intrinsic atherosclerosis. UPDATE 1/8/2019[name], 72 year old female returns for follow-up with history of stroke event in July 2018.  She has had 2 additional admissions for congestive heart failure since that time.  She is followed by Dr. Ricard Dillon and Dr. [name].  She has not had mitral valve repair.  Loop recorder so far no atrial fibrillation.  She remains on Plavix and aspirin for secondary stroke prevention.  She has not had further stroke or TIA symptoms.  She has minimal bruising and no bleeding.  Most recent lipid profile 05/09/2017 with LDL 56 HDL 39.  She remains on Pravachol without myalgias.  She continues to have intermittent mild headache.  She claims this is usually relieved with Tylenol arthritis.  She was asked to stop the Depakote she is not getting much benefit from this as a preventive.  She has mild peripheral edema today.  She complains with shortness of breath with exertion at times although she claims she walks to the grocery store which is a couple of blocks from her home.  Blood pressure in the office today 136/87.  She returns for reevaluation  UPDATE 7 8/2019[name], 72 year old female returns for follow-up with history of stroke event July 2018.  She remains on Plavix and aspirin  for secondary stroke prevention as well as cardiac prevention.  She has had minimal bruising and no bleeding.  She had mitral valve repair Nov 30, 2017.  She has had much less shortness of breath since her surgery.  She is to start cardiac rehab later this month.  She continues to be active.   She continues to drive without difficulty.  Loop recorder so far no atrial fibrillation.  She remains on Pravachol  without myalgias.  No peripheral edema noted.  She returns for reevaluation  REVIEW OF SYSTEMS: Full 14 system review of systems performed and notable only for those listed, all others are neg:  Constitutional: neg  Cardiovascular:  Neg Ear/Nose/Throat: neg  Skin: neg Eyes: neg Respiratory: neg Gastroitestinal: neg  Hematology/Lymphatic: neg  Endocrine: neg Musculoskeletal: Back pain Allergy/Immunology: neg Neurological: Occasional headache Psychiatric: neg Sleep : neg   ALLERGIES: Allergies  Allergen Reactions  . Tramadol Shortness Of Breath and Palpitations    HOME MEDICATIONS: Outpatient Medications Prior to Visit  Medication Sig Dispense Refill  . acetaminophen (TYLENOL) 500 MG tablet Take 1,000 mg by mouth every 6 (six) hours as needed for headache (pain).    Marland Kitchen amoxicillin (AMOXIL) 500 MG tablet Take 4 tablets (2,000 mg) 1 hour prior to dental procedures. 8 tablet 6  . aspirin 81 MG tablet Take 1 tablet (81 mg total) daily by mouth.    . butalbital-acetaminophen-caffeine (FIORICET, ESGIC) 50-325-40 MG tablet Take 1 tablet by mouth every 12 (twelve) hours as needed for headache. 14 tablet 0  . carvedilol (COREG) 6.25 MG tablet Take 0.5 tablets (3.125 mg total) by mouth 2 (two) times daily. 180 tablet 0  . clopidogrel (PLAVIX) 75 MG tablet Take 1 tablet (75 mg total) by mouth daily. 30 tablet 1  . feeding supplement, ENSURE ENLIVE, (ENSURE ENLIVE) LIQD Take 237 mLs by mouth 2 (two) times daily between meals. 237 mL 0  . furosemide (LASIX) 80 MG tablet Take 1 tablet (80 mg total) by mouth 2 (two) times daily. 60 tablet 0  . Hypromellose (ARTIFICIAL TEARS OP) Place 1 drop into both eyes daily as needed (dry eyes).    Marland Kitchen levothyroxine (SYNTHROID, LEVOTHROID) 25 MCG tablet Take 25 mcg by mouth daily before breakfast.    . potassium chloride SA (K-DUR,KLOR-CON) 20 MEQ  tablet Take 1 tablet (20 mEq total) by mouth 2 (two) times daily. 60 tablet 0  . pravastatin (PRAVACHOL) 80 MG tablet Take 1 tablet (80 mg total) by mouth at bedtime. 30 tablet 11   No facility-administered medications prior to visit.     PAST MEDICAL HISTORY: Past Medical History:  Diagnosis Date  . 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 Cancer [facility])    kidney cancer  . Chronic combined systolic and diastolic CHF (congestive heart failure) ([place])   . CKD (chronic kidney disease), stage III ([place])    removal of right kidney due to carcinoma  . Dyspnea   . Headache   . History of kidney cancer    a. s/p right nephrectomy ~2007.  Marland Kitchen History of loop recorder   . Hypercholesterolemia   . Hypertension   . Hypothyroidism   . Mitral regurgitation   . Pulmonary hypertension ([place])   . Stroke [facility])    no residual    PAST SURGICAL HISTORY: Past Surgical History:  Procedure Laterality Date  . BREAST EXCISIONAL BIOPSY Left   . BREAST EXCISIONAL BIOPSY Right   . CARDIAC CATHETERIZATION N/A 01/12/2016   Procedure: Right/Left Heart Cath and  Coronary/Graft Angiography;  Surgeon: Belva Crome, MD;  Location: [place] [facility];  Service: Cardiovascular;  Laterality: N/A;  . CERVICAL POLYPECTOMY N/A 04/23/2013   Procedure: CERVICAL POLYPECTOMY;  Surgeon: Maeola Sarah. Landry Mellow, MD;  Location: [place] [facility];  Service: Gynecology;  Laterality: N/A;  Possible Polypectomy  . HYSTEROSCOPY W/D&C N/A 04/23/2013   Procedure: DILATATION AND CURETTAGE /HYSTEROSCOPY;  Surgeon: Maeola Sarah. Landry Mellow, MD;  Location: [place] [facility];  Service: Gynecology;  Laterality: N/A;  . IR ANGIO INTRA EXTRACRAN SEL COM CAROTID INNOMINATE BILAT MOD SED  02/11/2017  . IR ANGIO VERTEBRAL SEL VERTEBRAL BILAT MOD SED  02/11/2017  . KNEE ARTHROSCOPY Left   . LOOP RECORDER INSERTION N/A 02/18/2017   Procedure: Loop Recorder Insertion;  Surgeon: Constance Haw, MD;  Location: [place] [facility];  Service: Cardiovascular;  Laterality: N/A;  . MITRAL VALVE REPAIR   11/30/2017  . MITRAL VALVE REPAIR N/A 11/30/2017   Procedure: MITRAL VALVE REPAIR;  Surgeon: Sherren Mocha, MD;  Location: [place] [facility];  Service: Cardiovascular;  Laterality: N/A;  . NEPHRECTOMY Right    due to carcinoma  . TEE WITHOUT CARDIOVERSION N/A 02/18/2017   Procedure: TRANSESOPHAGEAL ECHOCARDIOGRAM (TEE) WITH LOOP;  Surgeon: Acie Fredrickson Wonda Cheng, MD;  Location: [facility];  Service: Cardiovascular;  Laterality: N/A;    FAMILY HISTORY: Family History  Problem Relation Age of Onset  . Stroke Brother   . Diabetes Mellitus II Sister   . Breast cancer Sister   . Colon cancer Sister     SOCIAL HISTORY: Social History   Socioeconomic History  . Marital status: Divorced    Spouse name: Not on file  . Number of children: 5  . Years of education: 84  . Highest education level: Not on file  Occupational History  . Occupation: retired    Fish farm manager: [employer]  . Financial resource strain: Not on file  . Food insecurity:    Worry: Not on file    Inability: Not on file  . Transportation needs:    Medical: Not on file    Non-medical: Not on file  Tobacco Use  . Smoking status: Never Smoker  . Smokeless tobacco: Never Used  Substance and Sexual Activity  . Alcohol use: No  . Drug use: No  . Sexual activity: Never  Lifestyle  . Physical activity:    Days per week: Not on file    Minutes per session: Not on file  . Stress: Not on file  Relationships  . Social connections:    Talks on phone: Not on file    Gets together: Not on file    Attends religious service: Not on file    Active member of club or organization: Not on file    Attends meetings of clubs or organizations: Not on file    Relationship status: Not on file  . Intimate partner violence:    Fear of current or ex partner: Not on file    Emotionally abused: Not on file    Physically abused: Not on file    Forced sexual activity: Not on file  Other Topics Concern  . Not on file    Social History Narrative   Lives alone in a one story home.  Her son stays with her some.  Has 5 children, 10 grandchildren and 10 great grandchildren.  Retired Training and development officer at [employer].  Education: high school.       PHYSICAL EXAM  Vitals:   02/06/18 0744  BP: 98/63  Pulse: 64  Weight: 141 lb (64 kg)  Height: 5\' 3"  (1.6 m)   Body mass index is 24.98 kg/m.  Generalized: Well developed, in no acute distress  Head: normocephalic and atraumatic,. Oropharynx benign  Neck: Supple, no carotid bruits  Cardiac: Regular rate rhythm, no murmur  Musculoskeletal: No deformity  Skin no pedal edema  Neurological examination   Mentation: Alert oriented to time, place, history taking. Attention span and concentration appropriate. Recent and remote memory intact.  Follows all commands speech and language fluent.   Cranial nerve II-XII: Pupils were equal round reactive to light extraocular movements were full, visual field were full on confrontational test. Facial sensation and strength were normal. hearing was intact to finger rubbing bilaterally. Uvula tongue midline. head turning and shoulder shrug were normal and symmetric.Tongue protrusion into cheek strength was normal. Motor: normal bulk and tone, full strength in the BUE, BLE,  Sensory: normal and symmetric to light touch, pinprick, and  Vibration, in the upper and lower extremities   Coordination: finger-nose-finger, heel-to-shin bilaterally, no dysmetria, no tremor  Reflexes: 1+ upper and lower and symmetric, plantar responses were flexor bilaterally. Gait and Station: Rising up from seated position without assistance, normal stance,  moderate stride, good arm swing, smooth turning, able to perform tiptoe, and heel walking without difficulty. Tandem gait is steady.  No assistive device.   DIAGNOSTIC DATA (LABS, IMAGING, TESTING) - I reviewed patient records, labs, notes, testing and imaging myself where available.  Lab Results   Component Value Date   WBC 4.0 01/05/2018   HGB 12.4 01/05/2018   HCT 37.5 01/05/2018   MCV 93.8 01/05/2018   PLT 264 01/05/2018      Component Value Date/Time   NA 145 01/05/2018 0725   NA 142 05/26/2017 0856   K 4.2 01/05/2018 0725   CL 106 01/05/2018 0725   CO2 31 01/05/2018 0725   GLUCOSE 100 (H) 01/05/2018 0725   BUN 18 01/05/2018 0725   BUN 21 05/26/2017 0856   CREATININE 1.41 (H) 01/05/2018 0725   CREATININE 1.48 (H) 05/03/2016 0830   CALCIUM 9.9 01/05/2018 0725   PROT 7.2 11/25/2017 1313   PROT 7.0 02/24/2017 0821   ALBUMIN 3.7 11/25/2017 1313   ALBUMIN 4.0 02/24/2017 0821   AST 22 11/25/2017 1313   ALT 17 11/25/2017 1313   ALKPHOS 130 (H) 11/25/2017 1313   BILITOT 0.7 11/25/2017 1313   BILITOT 0.5 02/24/2017 0821   GFRNONAA 36 (L) 01/05/2018 0725   GFRAA 42 (L) 01/05/2018 0725   Lab Results  Component Value Date   CHOL 126 09/08/2017   HDL 52 09/08/2017   LDLCALC 69 09/08/2017   TRIG 24 09/08/2017   CHOLHDL 2.4 09/08/2017   Lab Results  Component Value Date   HGBA1C 5.5 11/25/2017    Lab Results  Component Value Date   TSH 2.932 09/07/2017      ASSESSMENT AND PLAN   28 lady with the 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 Remote silent left parietal MCA branch infarct as well. Vascular risk factors of hypertension hyperlipidemia.CHF. Post stroke headaches seem to have improved.  They are now relieved with Tylenol.The patient is a current patient of Dr. Leonie Man who is out of the office today . This note is sent to the work in doctor.  Mitral valve replacement Nov 30, 2017    PLAN: Stressed the importance of management of risk factors to prevent further stroke Continue Plavix  and aspirin for secondary stroke prevention Maintain strict control of hypertension with blood pressure goal below 130/90,  continue antihypertensive medications Control of diabetes with hemoglobin A1c below 6.5 followed by  primary care  Cholesterol with LDL cholesterol less than 70, followed by primary care,   continue Pravachol Exercise by walking, slowly increase , watch for shortness of breath , pt to begin cardiac rehab stay well hydrated eat healthy diet with whole grains,  fresh fruits and vegetables Continue follow-up with cardiology  Dr.McLean and Dr. Burt Knack Loop recorder so far no atrial fibrillation Discharge from stroke clinic I spent 25 minutes in total face to face time with the patient more than 50% of which was spent counseling and coordination of care, reviewing test results , hospital admissions reviewing medications and discussing and reviewing the diagnosis of stroke and management of risk factors.  Patient was given written instructions and these were reviewed Dennie Bible, [facility], [facility], APRN  [facility] [address], [place], [place] [address] [phone]

## 2018-02-06 ENCOUNTER — Ambulatory Visit (INDEPENDENT_AMBULATORY_CARE_PROVIDER_SITE_OTHER): Payer: Medicare Other | Admitting: Nurse Practitioner

## 2018-02-06 ENCOUNTER — Encounter: Payer: Self-pay | Admitting: Nurse Practitioner

## 2018-02-06 VITALS — BP 98/63 | HR 64 | Ht 63.0 in | Wt 141.0 lb

## 2018-02-06 DIAGNOSIS — E785 Hyperlipidemia, unspecified: Secondary | ICD-10-CM | POA: Diagnosis not present

## 2018-02-06 DIAGNOSIS — Z8673 Personal history of transient ischemic attack (TIA), and cerebral infarction without residual deficits: Secondary | ICD-10-CM | POA: Insufficient documentation

## 2018-02-06 NOTE — Patient Instructions (Addendum)
Stressed the importance of management of risk factors to prevent further stroke Continue Plavix and aspirin for secondary stroke prevention Maintain strict control of hypertension with blood pressure goal below 130/90,  continue antihypertensive medications Control of diabetes with hemoglobin A1c below 6.5 followed by primary care  Cholesterol with LDL cholesterol less than 70, followed by primary care,   continue Pravachol Exercise by walking, slowly increase , watch for shortness of breath stay well hydrated eat healthy diet with whole grains,  fresh fruits and vegetables Continue follow-up with cardiology  [place] and Dr. Burt Knack Loop recorder so far no atrial fibrillation Discharge from stroke clinic  Stroke Prevention Some health problems and behaviors may make it more likely for you to have a stroke. Below are ways to lessen your risk of having a stroke.  Be active for at least 30 minutes on most or all days.  Do not smoke. Try not to be around others who smoke.  Do not drink too much alcohol. ? Do not have more than 2 drinks a day if you are a man. ? Do not have more than 1 drink a day if you are a woman and are not pregnant.  Eat healthy foods, such as fruits and vegetables. If you were put on a specific diet, follow the diet as told.  Keep your cholesterol levels under control through diet and medicines. Look for foods that are low in saturated fat, trans fat, cholesterol, and are high in fiber.  If you have diabetes, follow all diet plans and take your medicine as told.  Ask your doctor if you need treatment to lower your blood pressure. If you have high blood pressure (hypertension), follow all diet plans and take your medicine as told by your doctor.  If you are 42-54 years old, have your blood pressure checked every 3-5 years. If you are age 81 or older, have your blood pressure checked every year.  Keep a healthy weight. Eat foods that are low in calories, salt,  saturated fat, trans fat, and cholesterol.  Do not take drugs.  Avoid birth control pills, if this applies. Talk to your doctor about the risks of taking birth control pills.  Talk to your doctor if you have sleep problems (sleep apnea).  Take all medicine as told by your doctor. ? You may be told to take aspirin or blood thinner medicine. Take this medicine as told by your doctor. ? Understand your medicine instructions.  Make sure any other conditions you have are being taken care of.  Get help right away if:  You suddenly lose feeling (you feel numb) or have weakness in your face, arm, or leg.  Your face or eyelid hangs down to one side.  You suddenly feel confused.  You have trouble talking (aphasia) or understanding what people are saying.  You suddenly have trouble seeing in one or both eyes.  You suddenly have trouble walking.  You are dizzy.  You lose your balance or your movements are clumsy (uncoordinated).  You suddenly have a very bad headache and you do not know the cause.  You have new chest pain.  Your heart feels like it is fluttering or skipping a beat (irregular heartbeat). Do not wait to see if the symptoms above go away. Get help right away. Call your local emergency services (911 in [place] Do not drive yourself to the hospital. This information is not intended to replace advice given to you by your health care provider. Make sure  you discuss any questions you have with your health care provider. Document Released: 01/18/2012 Document Revised: 12/25/2015 Document Reviewed: 01/19/2013 Elsevier Interactive Patient Education  [employer].

## 2018-02-06 NOTE — Progress Notes (Signed)
I have read the note, and I agree with the clinical assessment and plan.  Lennie Dunnigan A. Jaivion Kingsley, MD, PhD, FAAN Certified in Neurology, Clinical Neurophysiology, Sleep Medicine, Pain Medicine and Neuroimaging  [facility] [address], [address] [place], [place] [address] [phone]  

## 2018-02-08 ENCOUNTER — Telehealth (HOSPITAL_COMMUNITY): Payer: Self-pay

## 2018-02-08 NOTE — Telephone Encounter (Signed)
Cardiac Rehab Medication Review by a Pharmacist  Does the patient  feel that his/her medications are working for him/her?  yes  Has the patient been experiencing any side effects to the medications prescribed?  Yes - dizziness, pt thinks it is blood pressure related, when it is under <99  Does the patient measure his/her own blood pressure or blood glucose at home?  yes 118/85, 113/80, 130/74, 108/64  Does the patient have any problems obtaining medications due to transportation or finances?   yes   Understanding of regimen: good Understanding of indications: good Potential of compliance: good    Pharmacist comments: Patient describes some episodes of dizziness related to her blood pressure. She notices the episodes correlate when her systolic blood pressure is under 100. She checks her blood pressure 4-5 times a day to monitor this.    Katie [name], Sherian Rein D PGY1 Pharmacy Resident  Phone [phone] 02/08/2018      3:58 PM

## 2018-02-09 ENCOUNTER — Encounter (HOSPITAL_COMMUNITY): Payer: Self-pay | Admitting: Cardiology

## 2018-02-09 ENCOUNTER — Ambulatory Visit (HOSPITAL_COMMUNITY)
Admission: RE | Admit: 2018-02-09 | Discharge: 2018-02-09 | Disposition: A | Discharge status: Home / Self Care | Payer: Medicare Other | Source: Ambulatory Visit | Attending: Cardiology | Admitting: Cardiology

## 2018-02-09 ENCOUNTER — Other Ambulatory Visit: Payer: Self-pay

## 2018-02-09 VITALS — BP 114/60 | HR 64 | Wt 142.0 lb

## 2018-02-09 DIAGNOSIS — I5032 Chronic diastolic (congestive) heart failure: Secondary | ICD-10-CM | POA: Insufficient documentation

## 2018-02-09 DIAGNOSIS — Z803 Family history of malignant neoplasm of breast: Secondary | ICD-10-CM | POA: Insufficient documentation

## 2018-02-09 DIAGNOSIS — I340 Nonrheumatic mitral (valve) insufficiency: Secondary | ICD-10-CM | POA: Insufficient documentation

## 2018-02-09 DIAGNOSIS — Z7982 Long term (current) use of aspirin: Secondary | ICD-10-CM | POA: Diagnosis not present

## 2018-02-09 DIAGNOSIS — Z7902 Long term (current) use of antithrombotics/antiplatelets: Secondary | ICD-10-CM | POA: Diagnosis not present

## 2018-02-09 DIAGNOSIS — Z79890 Hormone replacement therapy (postmenopausal): Secondary | ICD-10-CM | POA: Diagnosis not present

## 2018-02-09 DIAGNOSIS — Z79899 Other long term (current) drug therapy: Secondary | ICD-10-CM | POA: Diagnosis not present

## 2018-02-09 DIAGNOSIS — Z9889 Other specified postprocedural states: Secondary | ICD-10-CM | POA: Diagnosis not present

## 2018-02-09 DIAGNOSIS — Z823 Family history of stroke: Secondary | ICD-10-CM | POA: Diagnosis not present

## 2018-02-09 DIAGNOSIS — E785 Hyperlipidemia, unspecified: Secondary | ICD-10-CM | POA: Insufficient documentation

## 2018-02-09 DIAGNOSIS — N183 Chronic kidney disease, stage 3 (moderate): Secondary | ICD-10-CM | POA: Insufficient documentation

## 2018-02-09 DIAGNOSIS — Z905 Acquired absence of kidney: Secondary | ICD-10-CM | POA: Insufficient documentation

## 2018-02-09 DIAGNOSIS — Z833 Family history of diabetes mellitus: Secondary | ICD-10-CM | POA: Insufficient documentation

## 2018-02-09 DIAGNOSIS — Z8673 Personal history of transient ischemic attack (TIA), and cerebral infarction without residual deficits: Secondary | ICD-10-CM | POA: Insufficient documentation

## 2018-02-09 DIAGNOSIS — Z800 Family history of malignant neoplasm of digestive organs: Secondary | ICD-10-CM | POA: Diagnosis not present

## 2018-02-09 DIAGNOSIS — E039 Hypothyroidism, unspecified: Secondary | ICD-10-CM | POA: Diagnosis not present

## 2018-02-09 DIAGNOSIS — Z95818 Presence of other cardiac implants and grafts: Secondary | ICD-10-CM | POA: Diagnosis not present

## 2018-02-09 DIAGNOSIS — G629 Polyneuropathy, unspecified: Secondary | ICD-10-CM | POA: Insufficient documentation

## 2018-02-09 DIAGNOSIS — I130 Hypertensive heart and chronic kidney disease with heart failure and stage 1 through stage 4 chronic kidney disease, or unspecified chronic kidney disease: Secondary | ICD-10-CM | POA: Insufficient documentation

## 2018-02-09 DIAGNOSIS — I43 Cardiomyopathy in diseases classified elsewhere: Secondary | ICD-10-CM | POA: Insufficient documentation

## 2018-02-09 DIAGNOSIS — Z85528 Personal history of other malignant neoplasm of kidney: Secondary | ICD-10-CM | POA: Diagnosis not present

## 2018-02-09 DIAGNOSIS — E854 Organ-limited amyloidosis: Secondary | ICD-10-CM | POA: Diagnosis not present

## 2018-02-09 LAB — BASIC METABOLIC PANEL
Anion gap: 9 (ref 5–15)
BUN: 18 mg/dL (ref 8–23)
CO2: 28 mmol/L (ref 22–32)
Calcium: 9.7 mg/dL (ref 8.9–10.3)
Chloride: 105 mmol/L (ref 98–111)
Creatinine, Ser: 1.38 mg/dL — ABNORMAL HIGH (ref 0.44–1.00)
GFR calc Af Amer: 43 mL/min — ABNORMAL LOW (ref >60)
GFR calc non Af Amer: 37 mL/min — ABNORMAL LOW (ref >60)
Glucose, Bld: 98 mg/dL (ref 70–99)
Potassium: 4 mmol/L (ref 3.5–5.1)
Sodium: 142 mmol/L (ref 135–145)

## 2018-02-09 NOTE — Patient Instructions (Signed)
Start Tafamadis (they will call you)   Labs drawn today (if we do not call you, then your lab work was stable)   Your physician recommends that you schedule a follow-up appointment in: 2 months with Dr. [name]

## 2018-02-10 ENCOUNTER — Encounter (HOSPITAL_COMMUNITY): Payer: Self-pay | Admitting: *Deleted

## 2018-02-10 ENCOUNTER — Other Ambulatory Visit: Payer: Self-pay | Admitting: *Deleted

## 2018-02-10 NOTE — Patient Outreach (Signed)
[facility]) Care Management  02/10/2018  DOYLENE SPLINTER 06-15-1946 [id number]   Call placed to member to follow up on current health status and recent visit with heart failure clinic.  She report she is "doing good."  Denies MD having any concerns, weight continues to be stable/decreasing.  She expresses no concern for shortness of breath, performs readings of her cardiomems daily.  Sate she will be starting on Tafamidis for treatment of amyloidosis, which will be provided by the drug company.    Denies any further needs for complex case management, discussed transition back to health coach.  She agrees to transition, advised to contact this care manager or notify health coach if health conditions change.  Will notify primary MD of transition to health coach. Will place referral to health coach.  [name], [place], MSN [place] [phone]

## 2018-02-10 NOTE — Progress Notes (Signed)
PCP: Dr. [name] HF Cardiology: Dr. [name]  72 yo with history of chronic diastolic CHF, mitral regurgitation, CVA, and CKD returns for followup of CHF.   Patient was initially admitted with diastolic CHF in 2/35. Echo showed EF 60-65% with grade 3 diastolic dysfunction and moderate MR.  She had cath showing no coronary disease. 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  TEE this admission showed moderate-severe MR.  She was admitted again in 10/18 with acute on chronic diastolic CHF. Echo this admission showed EF 50-55%, severe LVH, restrictive diastolic dysfunction, severe MR, moderate-severe TR.  She was referred to Dr. [name] who saw her recently for mitral valve evaluation.  Upon review of studies, regurgitation thought to be more in the moderate range and likely functional.   Patient had PYP amyloid scan in 12/18, this was suggestive of transthyretin amyloidosis.  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   Admission for A/C diastolic HF 3/6-1/44/3154. Required IV diuresis, then transitioned to 80 mg PO lasix BID. Metop was DC'd due to low blood pressures. Cardiac MRI strongly suggestive of amyloidosis, moderate to severe MR (moderate by regurgitant fraction 41% but severe visually). Echo repeated: EF 40-45%, moderate to severe LVH, severe MR, severe dilation of LA and RA, moderate TR, PASP 49.  She had Mitraclip x 2 placed in 5/19.  Repeat echo post-mitraclip showed EF 55-60%, moderate LVH, mean gradient 3 mmHg across the MV with trace-mild MR.   Cardiomems was placed 6/19.    She returns today for followup of CHF.  PADP is 11 by Cardiomems. No significant exertional dyspnea at this point. Main complaint continues to be neuropathy pain in her legs.  She has occasional episodes of lightheadedness but these seem to be rare.  No chest pain.  No orthopnea/PND.  Weight down 2 bls.    Labs (10/18): K 4.4, creatinine 1.27, LDL 56, HDL 39 Labs  (11/18): K 3.9, creatinine 1.43, SPEP negative Labs (2/19): K 3.8, creatinine 1.40, hgb 13.4 Labs (5/19): K 4.1, creatinine 1.4, hgb 10.8 Labs (6/19): K 4.2, creatinine 1.4, hgb 12.4  PMH: 1. HTN 2. Hyperlipidemia 3. 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  LINQ monitor placed.  4. Chronic diastolic CHF: She has Cardiomems.  - LHC (6/17): Normal coronaries.  - Echo (10/18): EF 50-55%, severe LVH with speckled myocardium concerning for amyloidosis, restrictive diastolic function with septal flattening, severe MR, moderate-severe TR, severe biatrial enlargement, PASP 54 mmHg.  - PYP amyloid scan: Grade 3, suggestive of transthyretin amyloidosis.  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   - Echo (5/19): EF 55-60%, moderate LVH c/w amyloidosis, Mitraclip x 2 with mean MV gradient 3 mmHg, trace-mild MR, moderate TR, PASP 62 mmHg.  - Echo (6/19): EF 55-60%, moderate LVH with speckled myocardium, trivial MR s/p Mitraclip with mean gradient 2 mmHg, PASP 61.  5. Mitral regurgitation: Think mixed functional/degenerative - TEE (7/18): EF 45-50%, moderate-severe MR, mild-moderate TR.  - Echo (10/18) read as severe MR.  - 2/19 echo with severe MR.  - 2/19 cardiac MRI with moderate to severe MR.  -  Mitraclip x 2 in 5/19.   - Echo 5/19 post-Mitraclip with mean MV gradient 3 mmHg, trace-mild MR.  6. CKD stage 3 7. Hypothyroidism 8. H/o renal cell CA s/p nephrectomy.  9. Bilateral carpal tunnel releases.   Social History   Socioeconomic History  . Marital status: Divorced    Spouse  name: Not on file  . Number of children: 5  . Years of education: 62  . Highest education level: Not on file  Occupational History  . Occupation: retired    Fish farm manager: [employer]  . Financial resource strain: Not on file  . Food insecurity:    Worry: Not on file    Inability: Not on file  . Transportation needs:    Medical: Not on file    Non-medical: Not on file  Tobacco Use  . Smoking status: Never Smoker  . Smokeless tobacco: Never Used  Substance and Sexual Activity  . Alcohol use: No  . Drug use: No  . Sexual activity: Never  Lifestyle  . Physical activity:    Days per week: Not on file    Minutes per session: Not on file  . Stress: Not on file  Relationships  . Social connections:    Talks on phone: Not on file    Gets together: Not on file    Attends religious service: Not on file    Active member of club or organization: Not on file    Attends meetings of clubs or organizations: Not on file    Relationship status: Not on file  . Intimate partner violence:    Fear of current or ex partner: Not on file    Emotionally abused: Not on file    Physically abused: Not on file    Forced sexual activity: Not on file  Other Topics Concern  . Not on file  Social History Narrative   Lives alone in a one story home.  Her son stays with her some.  Has 5 children, 10 grandchildren and 10 great grandchildren.  Retired Training and development officer at [employer].  Education: high school.     Family History  Problem Relation Age of Onset  . Stroke Brother   . Diabetes Mellitus II Sister   . Breast cancer Sister   . Colon cancer Sister    ROS: All systems reviewed and negative except as per HPI.   Current Outpatient Medications  Medication Sig Dispense Refill  . acetaminophen (TYLENOL) 500 MG tablet Take 1,000 mg by mouth every 6 (six) hours as needed for headache (pain).    Marland Kitchen amoxicillin (AMOXIL) 500 MG tablet Take 4 tablets (2,000 mg) 1 hour prior to dental procedures. 8  tablet 6  . aspirin 81 MG tablet Take 1 tablet (81 mg total) daily by mouth.    . butalbital-acetaminophen-caffeine (FIORICET, ESGIC) 50-325-40 MG tablet Take 1 tablet by mouth every 12 (twelve) hours as needed for headache. 14 tablet 0  . carvedilol (COREG) 6.25 MG tablet Take 0.5 tablets (3.125 mg total) by mouth 2 (two) times daily. 180 tablet 0  . clopidogrel (PLAVIX) 75 MG tablet Take 1 tablet (75 mg total) by mouth daily. 30 tablet 1  . feeding supplement, ENSURE ENLIVE, (ENSURE ENLIVE) LIQD Take 237 mLs by mouth 2 (two) times daily between meals. 237 mL 0  . furosemide (LASIX) 80 MG tablet Take 1 tablet (80 mg total) by mouth 2 (two) times daily. 60 tablet 0  . Hypromellose (ARTIFICIAL TEARS OP) Place 1 drop  into both eyes daily as needed (dry eyes).    Marland Kitchen levothyroxine (SYNTHROID, LEVOTHROID) 25 MCG tablet Take 25 mcg by mouth daily before breakfast.    . potassium chloride SA (K-DUR,KLOR-CON) 20 MEQ tablet Take 1 tablet (20 mEq total) by mouth 2 (two) times daily. 60 tablet 0  . pravastatin (PRAVACHOL) 80 MG tablet Take 1 tablet (80 mg total) by mouth at bedtime. 30 tablet 11   No current facility-administered medications for this encounter.    BP 114/60   Pulse 64   Wt 142 lb (64.4 kg)   LMP 02/12/2013   SpO2 99%   BMI 25.15 kg/m  General: NAD Neck: No JVD, no thyromegaly or thyroid nodule.  Lungs: Clear to auscultation bilaterally with normal respiratory effort. CV: Nondisplaced PMI.  Heart regular S1/S2, no S3/S4, no murmur.  No peripheral edema.  No carotid bruit.  Normal pedal pulses.  Abdomen: Soft, nontender, no hepatosplenomegaly, no distention.  Skin: Intact without lesions or rashes.  Neurologic: Alert and oriented x 3.  Psych: Normal affect. Extremities: No clubbing or cyanosis.  HEENT: Normal.    Assessment/Plan: 1. Chronic diastolic CHF: Normal EF on 10/18 echo with severe LVH and speckled myocardium consistent with cardiac amyloidosis.  SPEP was negative. PYP  amyloidosis scan was grade 3, consistent with transthyretin amyloidosis. 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 Cardiac MRI also strongly suggestive of cardiac amyloidosis.  On exam today and by Cardiomems evaluation, she is not volume overloaded. She is overall improved, NYHA class II.    - Continue Lasix 80 mg BID.  - Continue carvedilol 3.125 mg BID. 2. Mitral regurgitation: Mitral regurgitation seems to fluctuate somewhat based on loading conditions.  Most recently, echo in 2/19 suggested severe MR and cardiac MRI suggested moderate to severe MR (moderate by 41% regurgitant fraction but visually severe). I suspect that the etiology is mixed - functional as well as degenerative with thickening and restriction of posterior leaflet. When she is volume overloaded, her murmur is more prominent. She is now s/p Mitraclip procedure in 5/19. Post-clip echo in 6/19 showed mean gradient 2 mmHg across the MV with trace MR.  3. CKD: Stage III.  BMET today.  4. CVA: 7/18. Has LINQ monitor, no atrial fibrillation detected so far.  - Continue ASA 81.   - Continue Plavix. - Continue statin, lipids ok 09/2017 5. 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  6. Cardiac amyloidosis: Hereditary TTR amyloidosis.  She has peripheral neuropathy as well as bilateral carpal tunnel syndrome.   - She is going to start tafamidis.  Followup in 3 months.   Loralie Champagne,  02/10/2018 6:23 AM

## 2018-02-16 ENCOUNTER — Encounter (HOSPITAL_COMMUNITY): Payer: Self-pay

## 2018-02-16 ENCOUNTER — Encounter (HOSPITAL_COMMUNITY)
Admission: RE | Admit: 2018-02-16 | Discharge: 2018-02-16 | Disposition: A | Discharge status: Home / Self Care | Payer: Medicare Other | Source: Ambulatory Visit | Attending: Cardiology | Admitting: Cardiology

## 2018-02-16 VITALS — Ht 62.5 in | Wt 141.1 lb

## 2018-02-16 DIAGNOSIS — Z9889 Other specified postprocedural states: Secondary | ICD-10-CM | POA: Diagnosis not present

## 2018-02-16 DIAGNOSIS — I5042 Chronic combined systolic (congestive) and diastolic (congestive) heart failure: Secondary | ICD-10-CM | POA: Diagnosis not present

## 2018-02-16 DIAGNOSIS — Z79899 Other long term (current) drug therapy: Secondary | ICD-10-CM | POA: Insufficient documentation

## 2018-02-16 DIAGNOSIS — E039 Hypothyroidism, unspecified: Secondary | ICD-10-CM | POA: Insufficient documentation

## 2018-02-16 DIAGNOSIS — Z95818 Presence of other cardiac implants and grafts: Secondary | ICD-10-CM | POA: Insufficient documentation

## 2018-02-16 DIAGNOSIS — Z7902 Long term (current) use of antithrombotics/antiplatelets: Secondary | ICD-10-CM | POA: Insufficient documentation

## 2018-02-16 DIAGNOSIS — I130 Hypertensive heart and chronic kidney disease with heart failure and stage 1 through stage 4 chronic kidney disease, or unspecified chronic kidney disease: Secondary | ICD-10-CM | POA: Insufficient documentation

## 2018-02-16 DIAGNOSIS — Z79890 Hormone replacement therapy (postmenopausal): Secondary | ICD-10-CM | POA: Insufficient documentation

## 2018-02-16 DIAGNOSIS — E7800 Pure hypercholesterolemia, unspecified: Secondary | ICD-10-CM | POA: Diagnosis not present

## 2018-02-16 DIAGNOSIS — N183 Chronic kidney disease, stage 3 (moderate): Secondary | ICD-10-CM | POA: Diagnosis not present

## 2018-02-16 DIAGNOSIS — Z7982 Long term (current) use of aspirin: Secondary | ICD-10-CM | POA: Diagnosis not present

## 2018-02-16 NOTE — Progress Notes (Signed)
Cardiac Individual Treatment Plan  Patient Details  Name: Katie [name] MRN: [id number] Date of Birth: 1946/01/20 Referring Provider:     CARDIAC REHAB PHASE II ORIENTATION from 02/16/2018 in [place]  Referring Provider  Larey Dresser MD       Initial Encounter Date:    CARDIAC REHAB PHASE II ORIENTATION from 02/16/2018 in [place]  Date  02/16/18      Visit Diagnosis: 11/30/17 S/P MVR (mitral valve repair)  Patient's Home Medications on Admission:  Current Outpatient Medications:  .  acetaminophen (TYLENOL) 500 MG tablet, Take 1,000 mg by mouth every 6 (six) hours as needed for headache (pain)., Disp: , Rfl:  .  amoxicillin (AMOXIL) 500 MG tablet, Take 4 tablets (2,000 mg) 1 hour prior to dental procedures., Disp: 8 tablet, Rfl: 6 .  aspirin 81 MG tablet, Take 1 tablet (81 mg total) daily by mouth., Disp: , Rfl:  .  butalbital-acetaminophen-caffeine (FIORICET, ESGIC) 50-325-40 MG tablet, Take 1 tablet by mouth every 12 (twelve) hours as needed for headache., Disp: 14 tablet, Rfl: 0 .  carvedilol (COREG) 6.25 MG tablet, Take 0.5 tablets (3.125 mg total) by mouth 2 (two) times daily., Disp: 180 tablet, Rfl: 0 .  clopidogrel (PLAVIX) 75 MG tablet, Take 1 tablet (75 mg total) by mouth daily., Disp: 30 tablet, Rfl: 1 .  feeding supplement, ENSURE ENLIVE, (ENSURE ENLIVE) LIQD, Take 237 mLs by mouth 2 (two) times daily between meals., Disp: 237 mL, Rfl: 0 .  furosemide (LASIX) 80 MG tablet, Take 1 tablet (80 mg total) by mouth 2 (two) times daily., Disp: 60 tablet, Rfl: 0 .  Hypromellose (ARTIFICIAL TEARS OP), Place 1 drop into both eyes daily as needed (dry eyes)., Disp: , Rfl:  .  levothyroxine (SYNTHROID, LEVOTHROID) 25 MCG tablet, Take 25 mcg by mouth daily before breakfast., Disp: , Rfl:  .  potassium chloride SA (K-DUR,KLOR-CON) 20 MEQ tablet, Take 1 tablet (20 mEq total) by mouth 2 (two) times daily., Disp: 60 tablet,  Rfl: 0 .  pravastatin (PRAVACHOL) 80 MG tablet, Take 1 tablet (80 mg total) by mouth at bedtime., Disp: 30 tablet, Rfl: 11  Past Medical History: Past Medical History:  Diagnosis Date  . 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 Cancer [facility])    kidney cancer  . Chronic combined systolic and diastolic CHF (congestive heart failure) ([place])   . 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 Dyspnea   . Headache   . History of kidney cancer    a. s/p right nephrectomy ~2007.  Marland Kitchen History of loop recorder   . Hypercholesterolemia   . Hypertension   . Hypothyroidism   . Mitral regurgitation   . Pulmonary hypertension ([place])   . Stroke [facility])    no residual    Tobacco Use: Social History   Tobacco Use  Smoking Status Never Smoker  Smokeless Tobacco Never Used    Labs: Recent Review Flowsheet Data    Labs for ITP Cardiac and Pulmonary Rehab Latest Ref Rng & Units 02/10/2017 05/09/2017 09/08/2017 11/25/2017 01/05/2018   Cholestrol 0 - 200 mg/dL 159 105 126 - -   LDLCALC 0 - 99 mg/dL 88 56 69 - -   HDL >40 mg/dL 58 39(L) 52 - -   Trlycerides <150 mg/dL 66 48 24 - -   Hemoglobin A1c 4.8 - 5.6 % 5.6 5.2 -  5.5 -   PHART 7.350 - 7.450 - - - 7.441 -   PCO2ART 32.0 - 48.0 mmHg - - - 42.3 -   HCO3 20.0 - 28.0 mmol/L - - - 28.3(H) 28.8(H)   TCO2 22 - 32 mmol/L - - - - 30   O2SAT % - - - 98.4 71.0      Capillary Blood Glucose: Lab Results  Component Value Date   GLUCAP 84 01/08/2016   GLUCAP 80 01/08/2016     Exercise Target Goals: Date: 02/16/18  Exercise Program Goal: Individual exercise prescription set using results from initial 6 min walk test and THRR while considering  patient's activity barriers and safety.   Exercise Prescription Goal: Initial exercise prescription builds to 30-45 minutes a day of aerobic activity, 2-3 days per week.  Home exercise guidelines will be given to patient during program as part of exercise prescription that  the participant will acknowledge.  Activity Barriers & Risk Stratification: Activity Barriers & Cardiac Risk Stratification - 02/16/18 0821      Activity Barriers & Cardiac Risk Stratification   Activity Barriers  Joint Problems;Deconditioning;Muscular Weakness;Back Problems;Arthritis;Other (comment)    Comments  carpal tunnel syndrome in hands    Cardiac Risk Stratification  High       6 Minute Walk: 6 Minute Walk    Row Name 02/16/18 1013         6 Minute Walk   Phase  Initial     Distance  1052 feet     Walk Time  6 minutes     # of Rest Breaks  0     MPH  1.99     METS  2.01     RPE  12     Perceived Dyspnea   0     VO2 Peak  7.03     Symptoms  No     Resting HR  69 bpm     Resting BP  100/54     Resting Oxygen Saturation   98 %     Exercise Oxygen Saturation  during 6 min walk  97 %     Max Ex. HR  78 bpm     Max Ex. BP  104/60     2 Minute Post BP  104/70        Oxygen Initial Assessment:   Oxygen Re-Evaluation:   Oxygen Discharge (Final Oxygen Re-Evaluation):   Initial Exercise Prescription: Initial Exercise Prescription - 02/16/18 1000      Date of Initial Exercise RX and Referring Provider   Date  02/16/18    Referring Provider  Larey Dresser MD     Expected Discharge Date  05/26/18      NuStep   Level  2    SPM  75    Minutes  10    METs  2      Arm Ergometer   Level  1.5    Watts  20    Minutes  10    METs  2.61      Track   Laps  9    Minutes  10    METs  2.53      Prescription Details   Frequency (times per week)  3x    Duration  Progress to 30 minutes of continuous aerobic without signs/symptoms of physical distress      Intensity   THRR 40-80% of Max Heartrate  59-118    Ratings of Perceived Exertion  11-13  Perceived Dyspnea  0-4      Progression   Progression  Continue progressive overload as per policy without signs/symptoms or physical distress.      Resistance Training   Training Prescription  Yes     Weight  2lbs    Reps  10-15       Perform Capillary Blood Glucose checks as needed.  Exercise Prescription Changes:   Exercise Comments:   Exercise Goals and Review: Exercise Goals    Row Name 02/16/18 0017             Exercise Goals   Increase Physical Activity  Yes       Intervention  Provide advice, education, support and counseling about physical activity/exercise needs.;Develop an individualized exercise prescription for aerobic and resistive training based on initial evaluation findings, risk stratification, comorbidities and participant's personal goals.       Expected Outcomes  Short Term: Attend rehab on a regular basis to increase amount of physical activity.;Long Term: Add in home exercise to make exercise part of routine and to increase amount of physical activity.;Long Term: Exercising regularly at least 3-5 days a week.       Increase Strength and Stamina  Yes       Intervention  Provide advice, education, support and counseling about physical activity/exercise needs.;Develop an individualized exercise prescription for aerobic and resistive training based on initial evaluation findings, risk stratification, comorbidities and participant's personal goals.       Expected Outcomes  Short Term: Increase workloads from initial exercise prescription for resistance, speed, and METs.;Short Term: Perform resistance training exercises routinely during rehab and add in resistance training at home;Long Term: Improve cardiorespiratory fitness, muscular endurance and strength as measured by increased METs and functional capacity (6MWT)       Able to understand and use rate of perceived exertion (RPE) scale  Yes       Intervention  Provide education and explanation on how to use RPE scale       Expected Outcomes  Short Term: Able to use RPE daily in rehab to express subjective intensity level;Long Term:  Able to use RPE to guide intensity level when exercising independently        Knowledge and understanding of Target Heart Rate Range (THRR)  Yes       Intervention  Provide education and explanation of THRR including how the numbers were predicted and where they are located for reference       Expected Outcomes  Short Term: Able to state/look up THRR;Long Term: Able to use THRR to govern intensity when exercising independently;Short Term: Able to use daily as guideline for intensity in rehab       Able to check pulse independently  Yes       Intervention  Provide education and demonstration on how to check pulse in carotid and radial arteries.;Review the importance of being able to check your own pulse for safety during independent exercise       Expected Outcomes  Short Term: Able to explain why pulse checking is important during independent exercise;Long Term: Able to check pulse independently and accurately       Understanding of Exercise Prescription  Yes       Intervention  Provide education, explanation, and written materials on patient's individual exercise prescription       Expected Outcomes  Short Term: Able to explain program exercise prescription;Long Term: Able to explain home exercise prescription to exercise independently  Exercise Goals Re-Evaluation :    Discharge Exercise Prescription (Final Exercise Prescription Changes):   Nutrition:  Target Goals: Understanding of nutrition guidelines, daily intake of sodium 1500mg , cholesterol 200mg , calories 30% from fat and 7% or less from saturated fats, daily to have 5 or more servings of fruits and vegetables.  Biometrics: Pre Biometrics - 02/16/18 1014      Pre Biometrics   Height  5' 2.5" (1.588 m)    Weight  141 lb 1.5 oz (64 kg)    Waist Circumference  33.75 inches    Hip Circumference  39.75 inches    Waist to Hip Ratio  0.85 %    BMI (Calculated)  25.38    Triceps Skinfold  36 mm    % Body Fat  39.3 %    Grip Strength  28 kg    Flexibility  12.5 in    Single Leg Stand  30 seconds         Nutrition Therapy Plan and Nutrition Goals:   Nutrition Assessments:   Nutrition Goals Re-Evaluation:   Nutrition Goals Re-Evaluation:   Nutrition Goals Discharge (Final Nutrition Goals Re-Evaluation):   Psychosocial: Target Goals: Acknowledge presence or absence of significant depression and/or stress, maximize coping skills, provide positive support system. Participant is able to verbalize types and ability to use techniques and skills needed for reducing stress and depression.  Initial Review & Psychosocial Screening: Initial Psych Review & Screening - 02/16/18 1043      Initial Review   Current issues with  None Identified      Family Dynamics   Good Support System?  Yes Jennine reports her family and friends as sources of support.       Barriers   Psychosocial barriers to participate in program  There are no identifiable barriers or psychosocial needs.      Screening Interventions   Interventions  Encouraged to exercise       Quality of Life Scores: Quality of Life - 02/16/18 1015      Quality of Life   Select  Quality of Life      Quality of Life Scores   Health/Function Pre  30 %    Socioeconomic Pre  27 %    Psych/Spiritual Pre  30 %    Family Pre  30 %    GLOBAL Pre  29.42 %      Scores of 19 and below usually indicate a poorer quality of life in these areas.  A difference of  2-3 points is a clinically meaningful difference.  A difference of 2-3 points in the total score of the Quality of Life Index has been associated with significant improvement in overall quality of life, self-image, physical symptoms, and general health in studies assessing change in quality of life.  PHQ-9: Recent Review Flowsheet Data    Depression screen [facility] 2/9 12/12/2017 07/28/2017 03/03/2017 02/22/2017   Decreased Interest 0 0 0 0   Down, Depressed, Hopeless 0 0 0 0   PHQ - 2 Score 0 0 0 0     Interpretation of Total Score  Total Score Depression Severity:  1-4 =  Minimal depression, 5-9 = Mild depression, 10-14 = Moderate depression, 15-19 = Moderately severe depression, 20-27 = Severe depression   Psychosocial Evaluation and Intervention:   Psychosocial Re-Evaluation:   Psychosocial Discharge (Final Psychosocial Re-Evaluation):   Vocational Rehabilitation: Provide vocational rehab assistance to qualifying candidates.   Vocational Rehab Evaluation & Intervention: Vocational Rehab - 02/16/18 1043  Initial Vocational Rehab Evaluation & Intervention   Assessment shows need for Vocational Rehabilitation  No       Education: Education Goals: Education classes will be provided on a weekly basis, covering required topics. Participant will state understanding/return demonstration of topics presented.  Learning Barriers/Preferences: Learning Barriers/Preferences - 02/16/18 0820      Learning Barriers/Preferences   Learning Barriers  Sight    Learning Preferences  Written Material       Education Topics: Count Your Pulse:  -Group instruction provided by verbal instruction, demonstration, patient participation and written materials to support subject.  Instructors address importance of being able to find your pulse and how to count your pulse when at home without a heart monitor.  Patients get hands on experience counting their pulse with staff help and individually.   Heart Attack, Angina, and Risk Factor Modification:  -Group instruction provided by verbal instruction, video, and written materials to support subject.  Instructors address signs and symptoms of angina and heart attacks.    Also discuss risk factors for heart disease and how to make changes to improve heart health risk factors.   Functional Fitness:  -Group instruction provided by verbal instruction, demonstration, patient participation, and written materials to support subject.  Instructors address safety measures for doing things around the house.  Discuss how to get up  and down off the floor, how to pick things up properly, how to safely get out of a chair without assistance, and balance training.   Meditation and Mindfulness:  -Group instruction provided by verbal instruction, patient participation, and written materials to support subject.  Instructor addresses importance of mindfulness and meditation practice to help reduce stress and improve awareness.  Instructor also leads participants through a meditation exercise.    Stretching for Flexibility and Mobility:  -Group instruction provided by verbal instruction, patient participation, and written materials to support subject.  Instructors lead participants through series of stretches that are designed to increase flexibility thus improving mobility.  These stretches are additional exercise for major muscle groups that are typically performed during regular warm up and cool down.   Hands Only CPR:  -Group verbal, video, and participation provides a basic overview of AHA guidelines for community CPR. Role-play of emergencies allow participants the opportunity to practice calling for help and chest compression technique with discussion of AED use.   Hypertension: -Group verbal and written instruction that provides a basic overview of hypertension including the most recent diagnostic guidelines, risk factor reduction with self-care instructions and medication management.    Nutrition I class: Heart Healthy Eating:  -Group instruction provided by PowerPoint slides, verbal discussion, and written materials to support subject matter. The instructor gives an explanation and review of the Therapeutic Lifestyle Changes diet recommendations, which includes a discussion on lipid goals, dietary fat, sodium, fiber, plant stanol/sterol esters, sugar, and the components of a well-balanced, healthy diet.   Nutrition II class: Lifestyle Skills:  -Group instruction provided by PowerPoint slides, verbal discussion, and  written materials to support subject matter. The instructor gives an explanation and review of label reading, grocery shopping for heart health, heart healthy recipe modifications, and ways to make healthier choices when eating out.   Diabetes Question & Answer:  -Group instruction provided by PowerPoint slides, verbal discussion, and written materials to support subject matter. The instructor gives an explanation and review of diabetes co-morbidities, pre- and post-prandial blood glucose goals, pre-exercise blood glucose goals, signs, symptoms, and treatment of hypoglycemia and hyperglycemia, and foot  care basics.   Diabetes Blitz:  -Group instruction provided by PowerPoint slides, verbal discussion, and written materials to support subject matter. The instructor gives an explanation and review of the physiology behind type 1 and type 2 diabetes, diabetes medications and rational behind using different medications, pre- and post-prandial blood glucose recommendations and Hemoglobin A1c goals, diabetes diet, and exercise including blood glucose guidelines for exercising safely.    Portion Distortion:  -Group instruction provided by PowerPoint slides, verbal discussion, written materials, and food models to support subject matter. The instructor gives an explanation of serving size versus portion size, changes in portions sizes over the last 20 years, and what consists of a serving from each food group.   Stress Management:  -Group instruction provided by verbal instruction, video, and written materials to support subject matter.  Instructors review role of stress in heart disease and how to cope with stress positively.     Exercising on Your Own:  -Group instruction provided by verbal instruction, power point, and written materials to support subject.  Instructors discuss benefits of exercise, components of exercise, frequency and intensity of exercise, and end points for exercise.  Also discuss  use of nitroglycerin and activating EMS.  Review options of places to exercise outside of rehab.  Review guidelines for sex with heart disease.   Cardiac Drugs I:  -Group instruction provided by verbal instruction and written materials to support subject.  Instructor reviews cardiac drug classes: antiplatelets, anticoagulants, beta blockers, and statins.  Instructor discusses reasons, side effects, and lifestyle considerations for each drug class.   Cardiac Drugs II:  -Group instruction provided by verbal instruction and written materials to support subject.  Instructor reviews cardiac drug classes: angiotensin converting enzyme inhibitors (ACE-I), angiotensin II receptor blockers (ARBs), nitrates, and calcium channel blockers.  Instructor discusses reasons, side effects, and lifestyle considerations for each drug class.   Anatomy and Physiology of the Circulatory System:  Group verbal and written instruction and models provide basic cardiac anatomy and physiology, with the coronary electrical and arterial systems. Review of: AMI, Angina, Valve disease, Heart Failure, Peripheral Artery Disease, Cardiac Arrhythmia, Pacemakers, and the ICD.   Other Education:  -Group or individual verbal, written, or video instructions that support the educational goals of the cardiac rehab program.   Holiday Eating Survival Tips:  -Group instruction provided by PowerPoint slides, verbal discussion, and written materials to support subject matter. The instructor gives patients tips, tricks, and techniques to help them not only survive but enjoy the holidays despite the onslaught of food that accompanies the holidays.   Knowledge Questionnaire Score: Knowledge Questionnaire Score - 02/16/18 1014      Knowledge Questionnaire Score   Pre Score  18/24       Core Components/Risk Factors/Patient Goals at Admission: Personal Goals and Risk Factors at Admission - 02/16/18 1010      Core Components/Risk  Factors/Patient Goals on Admission    Weight Management  Yes;Weight Loss    Intervention  Weight Management: Develop a combined nutrition and exercise program designed to reach desired caloric intake, while maintaining appropriate intake of nutrient and fiber, sodium and fats, and appropriate energy expenditure required for the weight goal.;Weight Management: Provide education and appropriate resources to help participant work on and attain dietary goals.;Weight Management/Obesity: Establish reasonable short term and long term weight goals.;Obesity: Provide education and appropriate resources to help participant work on and attain dietary goals.    Admit Weight  141 lb 1.5 oz (64 kg)  Goal Weight: Short Term  135 lb (61.2 kg)    Goal Weight: Long Term  131 lb (59.4 kg)    Expected Outcomes  Short Term: Continue to assess and modify interventions until short term weight is achieved;Long Term: Adherence to nutrition and physical activity/exercise program aimed toward attainment of established weight goal;Weight Loss: Understanding of general recommendations for a balanced deficit meal plan, which promotes 1-2 lb weight loss per week and includes a negative energy balance of [phone] kcal/d;Understanding recommendations for meals to include 15-35% energy as protein, 25-35% energy from fat, 35-60% energy from carbohydrates, less than 200mg  of dietary cholesterol, 20-35 gm of total fiber daily;Understanding of distribution of calorie intake throughout the day with the consumption of 4-5 meals/snacks    Hypertension  Yes    Intervention  Provide education on lifestyle modifcations including regular physical activity/exercise, weight management, moderate sodium restriction and increased consumption of fresh fruit, vegetables, and low fat dairy, alcohol moderation, and smoking cessation.;Monitor prescription use compliance.    Expected Outcomes  Short Term: Continued assessment and intervention until BP is <  140/8mm HG in hypertensive participants. < 130/28mm HG in hypertensive participants with diabetes, heart failure or chronic kidney disease.;Long Term: Maintenance of blood pressure at goal levels.    Lipids  Yes    Intervention  Provide education and support for participant on nutrition & aerobic/resistive exercise along with prescribed medications to achieve LDL 70mg , HDL >40mg .    Expected Outcomes  Short Term: Participant states understanding of desired cholesterol values and is compliant with medications prescribed. Participant is following exercise prescription and nutrition guidelines.;Long Term: Cholesterol controlled with medications as prescribed, with individualized exercise RX and with personalized nutrition plan. Value goals: LDL < 70mg , HDL > 40 mg.    Stress  Yes    Intervention  Offer individual and/or small group education and counseling on adjustment to heart disease, stress management and health-related lifestyle change. Teach and support self-help strategies.;Refer participants experiencing significant psychosocial distress to appropriate mental health specialists for further evaluation and treatment. When possible, include family members and significant others in education/counseling sessions.    Expected Outcomes  Short Term: Participant demonstrates changes in health-related behavior, relaxation and other stress management skills, ability to obtain effective social support, and compliance with psychotropic medications if prescribed.;Long Term: Emotional wellbeing is indicated by absence of clinically significant psychosocial distress or social isolation.       Core Components/Risk Factors/Patient Goals Review:    Core Components/Risk Factors/Patient Goals at Discharge (Final Review):    ITP Comments: ITP Comments    Row Name 02/16/18 0817           ITP Comments  Dr. Fransico Him, Medical Director          Comments: Patient attended orientation from 0745 to [phone] to  review rules and guidelines for program. Completed 6 minute walk test, Intitial ITP, and exercise prescription.  VSS. Telemetry-SR.  Asymptomatic.

## 2018-02-16 NOTE — Progress Notes (Signed)
Katie [name] 72 y.o. female DOB: 02-03-1946 MRN: [id number]      Nutrition Note  1. 11/30/17 S/P MVR (mitral valve repair)    Past Medical History:  Diagnosis Date  . 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 Cancer [facility])    kidney cancer  . Chronic combined systolic and diastolic CHF (congestive heart failure) ([place])   . 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 Dyspnea   . Headache   . History of kidney cancer    a. s/p right nephrectomy ~2007.  Marland Kitchen History of loop recorder   . Hypercholesterolemia   . Hypertension   . Hypothyroidism   . Mitral regurgitation   . Pulmonary hypertension ([place])   . Stroke [facility])    no residual   Meds reviewed. Coreg, lasix, k-dur, pravastatin noted  HT: Ht Readings from Last 1 Encounters:  02/16/18 5' 2.5" (1.588 m)    WT: Wt Readings from Last 5 Encounters:  02/16/18 141 lb 1.5 oz (64 kg)  02/09/18 142 lb (64.4 kg)  02/06/18 141 lb (64 kg)  01/09/18 142 lb (64.4 kg)  01/05/18 143 lb (64.9 kg)     Body mass index is 25.4 kg/m.   Current tobacco use? no  Labs:  Lipid Panel     Component Value Date/Time   CHOL 126 09/08/2017 0356   TRIG 24 09/08/2017 0356   HDL 52 09/08/2017 0356   CHOLHDL 2.4 09/08/2017 0356   VLDL 5 09/08/2017 0356   LDLCALC 69 09/08/2017 0356    Lab Results  Component Value Date   HGBA1C 5.5 11/25/2017   CBG (last 3)  No results for input(s): GLUCAP in the last 72 hours.  Nutrition Note Spoke with pt. Nutrition plan and goals reviewed with pt. Pt is following Step 2 of the Therapeutic Lifestyle Changes diet. Pt wants to maintain wt, shared she has worked to lose ~15 lbs but likes the weight she is at currently. Wt maintenance tips reviewed.  Last A1c 5.5. Discussed aiming for complex carbohydrates, over simple and refined grains. Will modify is she experiences high levels of phosphorus. Pt with dx of CHF. Per discussion, pt does use canned/convenience foods often. Pt  rarely adds salt to food. Pt eats out infrequently. Discussed decreasing sodium by reading labels and choosing low sodium foods. Pt expressed understanding of the information reviewed. Pt aware of nutrition education classes offered and plans on attending nutrition classes.  Nutrition Diagnosis ? Food-and nutrition-related knowledge deficit related to lack of exposure to information as related to diagnosis of: ? CVD ? CHF  ? Overweight related to excessive energy intake as evidenced by a Body mass index is 25.4 kg/m.  Nutrition Intervention ? Pt's individual nutrition plan and goals reviewed with pt.  Nutrition Goal(s):  ? Pt to identify and limit food sources of saturated fat, trans fat, and sodium ? Pt to identify and limit food sources of refined grains and simple sugars   Plan:  ? Pt to attend nutrition classes ? Nutrition I ? Nutrition II ? Portion Distortion  ? Diabetes Blitz ? Diabetes Q & Ae determined ? Will provide client-centered nutrition education as part of interdisciplinary care ? Monitor and evaluate progress toward nutrition goal with team.   Laurina Bustle, MS, RD, LDN 02/16/2018 11:53 AM

## 2018-02-22 ENCOUNTER — Encounter (HOSPITAL_COMMUNITY)
Admission: RE | Admit: 2018-02-22 | Discharge: 2018-02-22 | Disposition: A | Discharge status: Home / Self Care | Payer: Medicare Other | Source: Ambulatory Visit | Attending: Cardiology | Admitting: Cardiology

## 2018-02-22 ENCOUNTER — Encounter (HOSPITAL_COMMUNITY): Payer: Self-pay

## 2018-02-22 ENCOUNTER — Encounter (HOSPITAL_COMMUNITY): Payer: Medicare Other

## 2018-02-22 DIAGNOSIS — Z9889 Other specified postprocedural states: Secondary | ICD-10-CM

## 2018-02-22 NOTE — Progress Notes (Signed)
Daily Session Note  Patient Details  Name: Katie [name] MRN: [id number] Date of Birth: May 15, 1946 Referring Provider:     CARDIAC REHAB PHASE II ORIENTATION from 02/16/2018 in [place]  Referring Provider  Larey Dresser MD       Encounter Date: 02/22/2018  Check In: Session Check In - 02/22/18 0718      Check-In   Location  [facility]    Staff Present  Seward Carol, MS, ACSM CEP, Exercise Physiologist;Tarris Delbene Karle Starch, RN, Mosie Epstein, MS,ACSM CEP, Exercise Physiologist;Joann Rion, RN, BSN    Supervising physician immediately available to respond to emergencies  [facility] immediately available    Physician(s)  Dr. Marthenia Rolling    Medication changes reported      No    Fall or balance concerns reported     No    Tobacco Cessation  No Change    Warm-up and Cool-down  Performed as group-led instruction    Resistance Training Performed  No    VAD Patient?  No    PAD/SET Patient?  No      Pain Assessment   Currently in Pain?  No/denies    Multiple Pain Sites  No       Capillary Blood Glucose: No results found for this or any previous visit (from the past 24 hour(s)).  Exercise Prescription Changes - 02/22/18 0900      Response to Exercise   Blood Pressure (Admit)  110/62    Blood Pressure (Exercise)  120/80    Blood Pressure (Exit)  114/80    Heart Rate (Admit)  85 bpm    Heart Rate (Exercise)  93 bpm    Heart Rate (Exit)  66 bpm    Rating of Perceived Exertion (Exercise)  13    Perceived Dyspnea (Exercise)  0    Symptoms  None     Comments  Pt oriented to exercise equipment     Duration  Progress to 30 minutes of  aerobic without signs/symptoms of physical distress    Intensity  THRR New      Progression   Progression  Continue to progress workloads to maintain intensity without signs/symptoms of physical distress.    Average METs  2.44      Resistance Training   Training Prescription  No      Interval  Training   Interval Training  No      NuStep   Level  3    SPM  85    Minutes  10    METs  2.8      Arm Ergometer   Level  1.5    Watts  20    Minutes  10    METs  2.64      Track   Laps  5    Minutes  10    METs  2       Social History   Tobacco Use  Smoking Status Never Smoker  Smokeless Tobacco Never Used    Goals Met:  Exercise tolerated well  Goals Unmet:  Not Applicable  Comments: Pt started cardiac rehab today.  Pt tolerated light exercise without difficulty. VSS, telemetry-SR, asymptomatic.  Medication list reconciled. Pt denies barriers to medicaiton compliance.  PSYCHOSOCIAL ASSESSMENT:  PHQ-0. Pt exhibits positive coping skills, hopeful outlook with supportive family. No psychosocial needs identified at this time, no psychosocial interventions necessary.  Pt oriented to exercise equipment and routine.    Understanding  verbalized.    Dr. Fransico Him is Medical Director for Cardiac Rehab at [facility].

## 2018-02-24 ENCOUNTER — Encounter (HOSPITAL_COMMUNITY)
Admission: RE | Admit: 2018-02-24 | Discharge: 2018-02-24 | Disposition: A | Discharge status: Home / Self Care | Payer: Medicare Other | Source: Ambulatory Visit | Attending: Cardiology | Admitting: Cardiology

## 2018-02-24 ENCOUNTER — Encounter (HOSPITAL_COMMUNITY): Payer: Medicare Other

## 2018-02-24 VITALS — Ht 62.5 in | Wt 141.5 lb

## 2018-02-24 DIAGNOSIS — Z9889 Other specified postprocedural states: Secondary | ICD-10-CM

## 2018-02-24 LAB — CUP PACEART REMOTE DEVICE CHECK
Date Time Interrogation Session: 20190628013943
Implantable Pulse Generator Implant Date: 20180720

## 2018-02-24 NOTE — Progress Notes (Signed)
Katie [name] 72 y.o. female Nutrition Note Spoke with pt. Nutrition Plan and Nutrition Survey goals reviewed with pt. Pt is following a Heart Healthy diet. Pt wants to maintain wt, shared she has worked to lose ~15 lbs but likes the weight she is at currently. Wt maintenance tips reviewed. As pts last A1c was 5.5. discussed aiming for complex carbohydrates, over simple and refined grains as able. Will modify is she experiences high levels of phosphorus. Pt with dx of CHF. Per discussion, pt does use canned/convenience foods often. Pt rarely adds salt to food. Pt eats out infrequently. Discussed decreasing sodium by reading labels and choosing low sodium foods. Pt expressed understanding of the information reviewed. Pt aware of nutrition education classes offered and plans on attending nutrition classes.   Lab Results  Component Value Date   HGBA1C 5.5 11/25/2017    Wt Readings from Last 3 Encounters:  02/16/18 141 lb 1.5 oz (64 kg)  02/09/18 142 lb (64.4 kg)  02/06/18 141 lb (64 kg)    Nutrition Diagnosis ? Food-and nutrition-related knowledge deficit related to lack of exposure to information as related to diagnosis of: ? CVD ? CHF   Nutrition Intervention ? Pt's individual nutrition plan reviewed with pt. ? Benefits of adopting Heart Healthy diet discussed when Medficts reviewed.    Goal(s)   ? Pt to identify and limit food sources of saturated fat, trans fat, and sodium ? Pt to identify and limit food sources of refined grains and simple sugars   Plan:  Pt to attend nutrition classes ? Nutrition I ? Nutrition II ? Portion Distortion  Will provide client-centered nutrition education as part of interdisciplinary care.  Monitor and evaluate progress toward nutrition goal with team.   Laurina Bustle, MS, RD, LDN 02/24/2018 8:32 AM

## 2018-02-27 ENCOUNTER — Encounter (HOSPITAL_COMMUNITY): Payer: Medicare Other

## 2018-03-01 ENCOUNTER — Encounter (HOSPITAL_COMMUNITY): Payer: Medicare Other

## 2018-03-01 ENCOUNTER — Ambulatory Visit (INDEPENDENT_AMBULATORY_CARE_PROVIDER_SITE_OTHER): Payer: Medicare Other | Admitting: *Deleted

## 2018-03-01 ENCOUNTER — Encounter (HOSPITAL_COMMUNITY)
Admission: RE | Admit: 2018-03-01 | Discharge: 2018-03-01 | Disposition: A | Discharge status: Home / Self Care | Payer: Medicare Other | Source: Ambulatory Visit | Attending: Cardiology | Admitting: Cardiology

## 2018-03-01 DIAGNOSIS — I639 Cerebral infarction, unspecified: Secondary | ICD-10-CM

## 2018-03-01 DIAGNOSIS — Z9889 Other specified postprocedural states: Secondary | ICD-10-CM | POA: Diagnosis not present

## 2018-03-01 NOTE — Progress Notes (Signed)
Carelink Summary Report / Loop Recorder 

## 2018-03-02 ENCOUNTER — Other Ambulatory Visit: Payer: Self-pay

## 2018-03-02 NOTE — Patient Outreach (Signed)
[facility]) Care Management  03/02/2018  MAO LOCKNER 11/29/1945 [id number]    1st outreach attempt to contact the patient for initial assessment. No answer. HIPAA compliant voicemail left with contact information.  Plan: RN Health Coach will send letter. RN Health Coach will make another attempt to the patient within four business days.  Lazaro Arms RN, BSN, [place] Direct Dial:  [phone]  Fax: [phone]

## 2018-03-03 ENCOUNTER — Encounter (HOSPITAL_COMMUNITY): Payer: Medicare Other

## 2018-03-03 ENCOUNTER — Encounter (HOSPITAL_COMMUNITY)
Admission: RE | Admit: 2018-03-03 | Discharge: 2018-03-03 | Disposition: A | Discharge status: Home / Self Care | Payer: Medicare Other | Source: Ambulatory Visit | Attending: Cardiology | Admitting: Cardiology

## 2018-03-03 DIAGNOSIS — I5042 Chronic combined systolic (congestive) and diastolic (congestive) heart failure: Secondary | ICD-10-CM | POA: Insufficient documentation

## 2018-03-03 DIAGNOSIS — Z7982 Long term (current) use of aspirin: Secondary | ICD-10-CM | POA: Insufficient documentation

## 2018-03-03 DIAGNOSIS — Z95818 Presence of other cardiac implants and grafts: Secondary | ICD-10-CM | POA: Diagnosis not present

## 2018-03-03 DIAGNOSIS — I130 Hypertensive heart and chronic kidney disease with heart failure and stage 1 through stage 4 chronic kidney disease, or unspecified chronic kidney disease: Secondary | ICD-10-CM | POA: Diagnosis not present

## 2018-03-03 DIAGNOSIS — E7800 Pure hypercholesterolemia, unspecified: Secondary | ICD-10-CM | POA: Diagnosis not present

## 2018-03-03 DIAGNOSIS — Z79890 Hormone replacement therapy (postmenopausal): Secondary | ICD-10-CM | POA: Insufficient documentation

## 2018-03-03 DIAGNOSIS — Z7902 Long term (current) use of antithrombotics/antiplatelets: Secondary | ICD-10-CM | POA: Diagnosis not present

## 2018-03-03 DIAGNOSIS — Z79899 Other long term (current) drug therapy: Secondary | ICD-10-CM | POA: Diagnosis not present

## 2018-03-03 DIAGNOSIS — N183 Chronic kidney disease, stage 3 (moderate): Secondary | ICD-10-CM | POA: Diagnosis not present

## 2018-03-03 DIAGNOSIS — E039 Hypothyroidism, unspecified: Secondary | ICD-10-CM | POA: Insufficient documentation

## 2018-03-03 DIAGNOSIS — Z9889 Other specified postprocedural states: Secondary | ICD-10-CM | POA: Diagnosis present

## 2018-03-06 ENCOUNTER — Encounter (HOSPITAL_COMMUNITY): Payer: Medicare Other

## 2018-03-06 ENCOUNTER — Other Ambulatory Visit (HOSPITAL_COMMUNITY): Payer: Self-pay | Admitting: Cardiology

## 2018-03-06 ENCOUNTER — Encounter (HOSPITAL_COMMUNITY)
Admission: RE | Admit: 2018-03-06 | Discharge: 2018-03-06 | Disposition: A | Discharge status: Home / Self Care | Payer: Medicare Other | Source: Ambulatory Visit | Attending: Cardiology | Admitting: Cardiology

## 2018-03-06 ENCOUNTER — Encounter (HOSPITAL_COMMUNITY): Payer: Self-pay

## 2018-03-06 DIAGNOSIS — Z9889 Other specified postprocedural states: Secondary | ICD-10-CM | POA: Diagnosis not present

## 2018-03-07 ENCOUNTER — Other Ambulatory Visit: Payer: Self-pay

## 2018-03-07 NOTE — Patient Outreach (Signed)
[name] Care Management  03/07/2018   Katie [name] [birth year] [id number]      Outreach attempt # 2.  HIPAA verified.  The patient was able to complete the initial assessment.  Social: The patient lives in the home with her son.  She is able to perform her ADLS/IADLS.  She states that she drives herself to appointments. The patient denies falls.  Durable medical equipment in the home consists of cane, blood pressure cuff, scale, dentures and eye glasses.   Conditions: per chart review the patient conditions include HTN, CHF, Amyloid heart disease, and CKD. The patient reports that she is seen at the heart clinic on M-W-FR. Her weight today was 141.  She denies any chest pain or swelling. She does states that she has some problems walking at times and will use her cane. She reports that she is observant of her diet and does not use any salt..  She checked her blood pressure today and it was 112/76 pulse 68.  Medications: the patient states that she is on 12 medications.  She is able to manage and pay for her medications.  Appointments: The patient has an appointment with Dr. [name] on 8/12. She has an appointment with Dr. Kathlen Mody in two weeks.  She states that she had an appointment with her Nephrologist yesterday.   Advanced Directives: The patient does not have an advanced directive and declined information.   Current Medications:  Current Outpatient Medications  Medication Sig Dispense Refill  . acetaminophen (TYLENOL) 500 MG tablet Take 1,000 mg by mouth every 6 (six) hours as needed for headache (pain).    Marland Kitchen amoxicillin (AMOXIL) 500 MG tablet Take 4 tablets (2,000 mg) 1 hour prior to dental procedures. 8 tablet 6  . aspirin 81 MG tablet Take 1 tablet (81 mg total) daily by mouth.    . butalbital-acetaminophen-caffeine (FIORICET, ESGIC) 50-325-40 MG tablet Take 1 tablet by mouth every 12 (twelve) hours as needed for headache. 14 tablet 0  . carvedilol (COREG)  6.25 MG tablet Take 0.5 tablets (3.125 mg total) by mouth 2 (two) times daily. 180 tablet 0  . clopidogrel (PLAVIX) 75 MG tablet Take 1 tablet (75 mg total) by mouth daily. 30 tablet 1  . feeding supplement, ENSURE ENLIVE, (ENSURE ENLIVE) LIQD Take 237 mLs by mouth 2 (two) times daily between meals. 237 mL 0  . furosemide (LASIX) 80 MG tablet Take 1 tablet (80 mg total) by mouth 2 (two) times daily. 60 tablet 0  . Hypromellose (ARTIFICIAL TEARS OP) Place 1 drop into both eyes daily as needed (dry eyes).    Marland Kitchen levothyroxine (SYNTHROID, LEVOTHROID) 25 MCG tablet Take 25 mcg by mouth daily before breakfast.    . potassium chloride SA (K-DUR,KLOR-CON) 20 MEQ tablet Take 1 tablet (20 mEq total) by mouth 2 (two) times daily. 60 tablet 0  . pravastatin (PRAVACHOL) 80 MG tablet Take 1 tablet (80 mg total) by mouth at bedtime. 30 tablet 11   No current facility-administered medications for this visit.     Functional Status:  In your present state of health, do you have any difficulty performing the following activities: 03/07/2018 12/12/2017  Hearing? N N  Vision? N N  Difficulty concentrating or making decisions? N N  Walking or climbing stairs? Y Y  Dressing or bathing? N N  Doing errands, shopping? N Y  Conservation officer, nature and eating ? - N  Using the Toilet? - N  In the past six months, have  you accidently leaked urine? - N  Do you have problems with loss of bowel control? - N  Managing your Medications? - N  Managing your Finances? - N  Housekeeping or managing your Housekeeping? - N  Some recent data might be hidden    Fall/Depression Screening: Fall Risk  03/07/2018 02/06/2018 12/15/2017  Falls in the past year? No No No  Number falls in past yr: - - -  Injury with Fall? - - -  Risk for fall due to : - - -  Follow up - - -   PHQ 2/9 Scores 03/07/2018 02/22/2018 12/12/2017 07/28/2017 03/03/2017 02/22/2017  PHQ - 2 Score 0 0 0 0 0 0    Assessment: Patient will benefit from health coach outreach for  disease management and support.   THN CM Care Plan Problem One     Most Recent Value  Care Plan Problem One  knowledge deficit related to CHF  Role Documenting the Problem One  [place] for Problem One  Active  THN Long Term Goal   in 90 days the patient will improve her health and verbalize no impatient admissions  THN Long Term Goal Start Date  03/07/18  Interventions for Problem One Long Term Goal  Reviewed signs and symptoms of heart failure, discussed with patient chf action plan, encouraged patient to weigh daily, verified with patient correct dose of fluid pills she should be taking from his last appointment, encouraged medication compliance     Plan: [facility] will provide ongoing education for patient on heart failure through phone calls and sending printed information to patient for further discussion.  RN Health Coach will send welcome packet with consent to patient as well as printed information on heart failure.  RN Health Coach will send initial barriers letter, assessment, and care plan to primary care physician. RN Health Coach will contact patient in the month of September and patient agrees to next outreach.  Lazaro Arms RN, BSN, [place] Direct Dial:  [phone]  Fax: [phone]

## 2018-03-08 ENCOUNTER — Encounter (HOSPITAL_COMMUNITY): Payer: Medicare Other

## 2018-03-08 ENCOUNTER — Encounter (HOSPITAL_COMMUNITY)
Admission: RE | Admit: 2018-03-08 | Discharge: 2018-03-08 | Disposition: A | Discharge status: Home / Self Care | Payer: Medicare Other | Source: Ambulatory Visit | Attending: Cardiology | Admitting: Cardiology

## 2018-03-08 DIAGNOSIS — Z9889 Other specified postprocedural states: Secondary | ICD-10-CM | POA: Diagnosis not present

## 2018-03-09 NOTE — Progress Notes (Signed)
Cardiac Individual Treatment Plan  Patient Details  Name: Katie [name] MRN: [id number] Date of Birth: 06/01/1946 Referring Provider:     CARDIAC REHAB PHASE II ORIENTATION from 02/16/2018 in [place]  Referring Provider  Larey Dresser MD       Initial Encounter Date:    CARDIAC REHAB PHASE II ORIENTATION from 02/16/2018 in [place]  Date  02/16/18      Visit Diagnosis: 11/30/17 S/P MVR (mitral valve repair)  Patient's Home Medications on Admission:  Current Outpatient Medications:  .  acetaminophen (TYLENOL) 500 MG tablet, Take 1,000 mg by mouth every 6 (six) hours as needed for headache (pain)., Disp: , Rfl:  .  amoxicillin (AMOXIL) 500 MG tablet, Take 4 tablets (2,000 mg) 1 hour prior to dental procedures., Disp: 8 tablet, Rfl: 6 .  aspirin 81 MG tablet, Take 1 tablet (81 mg total) daily by mouth., Disp: , Rfl:  .  butalbital-acetaminophen-caffeine (FIORICET, ESGIC) 50-325-40 MG tablet, Take 1 tablet by mouth every 12 (twelve) hours as needed for headache., Disp: 14 tablet, Rfl: 0 .  carvedilol (COREG) 6.25 MG tablet, Take 0.5 tablets (3.125 mg total) by mouth 2 (two) times daily., Disp: 180 tablet, Rfl: 0 .  clopidogrel (PLAVIX) 75 MG tablet, Take 1 tablet (75 mg total) by mouth daily., Disp: 30 tablet, Rfl: 1 .  feeding supplement, ENSURE ENLIVE, (ENSURE ENLIVE) LIQD, Take 237 mLs by mouth 2 (two) times daily between meals., Disp: 237 mL, Rfl: 0 .  furosemide (LASIX) 80 MG tablet, Take 1 tablet (80 mg total) by mouth 2 (two) times daily., Disp: 60 tablet, Rfl: 0 .  Hypromellose (ARTIFICIAL TEARS OP), Place 1 drop into both eyes daily as needed (dry eyes)., Disp: , Rfl:  .  levothyroxine (SYNTHROID, LEVOTHROID) 25 MCG tablet, Take 25 mcg by mouth daily before breakfast., Disp: , Rfl:  .  potassium chloride SA (K-DUR,KLOR-CON) 20 MEQ tablet, Take 1 tablet (20 mEq total) by mouth 2 (two) times daily., Disp: 60 tablet,  Rfl: 0 .  pravastatin (PRAVACHOL) 80 MG tablet, Take 1 tablet (80 mg total) by mouth at bedtime., Disp: 30 tablet, Rfl: 11  Past Medical History: Past Medical History:  Diagnosis Date  . 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 Cancer [facility])    kidney cancer  . Chronic combined systolic and diastolic CHF (congestive heart failure) ([place])   . 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 Dyspnea   . Headache   . History of kidney cancer    a. s/p right nephrectomy ~2007.  Marland Kitchen History of loop recorder   . Hypercholesterolemia   . Hypertension   . Hypothyroidism   . Mitral regurgitation   . Pulmonary hypertension ([place])   . Stroke [facility])    no residual    Tobacco Use: Social History   Tobacco Use  Smoking Status Never Smoker  Smokeless Tobacco Never Used    Labs: Recent Review Flowsheet Data    Labs for ITP Cardiac and Pulmonary Rehab Latest Ref Rng & Units 02/10/2017 05/09/2017 09/08/2017 11/25/2017 01/05/2018   Cholestrol 0 - 200 mg/dL 159 105 126 - -   LDLCALC 0 - 99 mg/dL 88 56 69 - -   HDL >40 mg/dL 58 39(L) 52 - -   Trlycerides <150 mg/dL 66 48 24 - -   Hemoglobin A1c 4.8 - 5.6 % 5.6 5.2 -  5.5 -   PHART 7.350 - 7.450 - - - 7.441 -   PCO2ART 32.0 - 48.0 mmHg - - - 42.3 -   HCO3 20.0 - 28.0 mmol/L - - - 28.3(H) 28.8(H)   TCO2 22 - 32 mmol/L - - - - 30   O2SAT % - - - 98.4 71.0      Capillary Blood Glucose: Lab Results  Component Value Date   GLUCAP 84 01/08/2016   GLUCAP 80 01/08/2016     Exercise Target Goals:    Exercise Program Goal: Individual exercise prescription set using results from initial 6 min walk test and THRR while considering  patient's activity barriers and safety.   Exercise Prescription Goal: Initial exercise prescription builds to 30-45 minutes a day of aerobic activity, 2-3 days per week.  Home exercise guidelines will be given to patient during program as part of exercise prescription that the participant  will acknowledge.  Activity Barriers & Risk Stratification: Activity Barriers & Cardiac Risk Stratification - 02/16/18 0821      Activity Barriers & Cardiac Risk Stratification   Activity Barriers  Joint Problems;Deconditioning;Muscular Weakness;Back Problems;Arthritis;Other (comment)    Comments  carpal tunnel syndrome in hands    Cardiac Risk Stratification  High       6 Minute Walk: 6 Minute Walk    Row Name 02/16/18 1013         6 Minute Walk   Phase  Initial     Distance  1052 feet     Walk Time  6 minutes     # of Rest Breaks  0     MPH  1.99     METS  2.01     RPE  12     Perceived Dyspnea   0     VO2 Peak  7.03     Symptoms  No     Resting HR  69 bpm     Resting BP  100/54     Resting Oxygen Saturation   98 %     Exercise Oxygen Saturation  during 6 min walk  97 %     Max Ex. HR  78 bpm     Max Ex. BP  104/60     2 Minute Post BP  104/70        Oxygen Initial Assessment:   Oxygen Re-Evaluation:   Oxygen Discharge (Final Oxygen Re-Evaluation):   Initial Exercise Prescription: Initial Exercise Prescription - 02/16/18 1000      Date of Initial Exercise RX and Referring Provider   Date  02/16/18    Referring Provider  Larey Dresser MD     Expected Discharge Date  05/26/18      NuStep   Level  2    SPM  75    Minutes  10    METs  2      Arm Ergometer   Level  1.5    Watts  20    Minutes  10    METs  2.61      Track   Laps  9    Minutes  10    METs  2.53      Prescription Details   Frequency (times per week)  3x    Duration  Progress to 30 minutes of continuous aerobic without signs/symptoms of physical distress      Intensity   THRR 40-80% of Max Heartrate  59-118    Ratings of Perceived Exertion  11-13  Perceived Dyspnea  0-4      Progression   Progression  Continue progressive overload as per policy without signs/symptoms or physical distress.      Resistance Training   Training Prescription  Yes    Weight  2lbs    Reps   10-15       Perform Capillary Blood Glucose checks as needed.  Exercise Prescription Changes: Exercise Prescription Changes    Row Name 02/22/18 0900 03/06/18 0810           Response to Exercise   Blood Pressure (Admit)  110/62  112/80      Blood Pressure (Exercise)  120/80  120/70      Blood Pressure (Exit)  114/80  102/70      Heart Rate (Admit)  85 bpm  80 bpm      Heart Rate (Exercise)  93 bpm  106 bpm      Heart Rate (Exit)  66 bpm  82 bpm      Rating of Perceived Exertion (Exercise)  13  9      Perceived Dyspnea (Exercise)  0  0      Symptoms  None   None       Comments  Pt oriented to exercise equipment   None       Duration  Progress to 30 minutes of  aerobic without signs/symptoms of physical distress  Continue with 30 min of aerobic exercise without signs/symptoms of physical distress.      Intensity  THRR New  THRR unchanged        Progression   Progression  Continue to progress workloads to maintain intensity without signs/symptoms of physical distress.  Continue to progress workloads to maintain intensity without signs/symptoms of physical distress.      Average METs  2.44  2.53        Resistance Training   Training Prescription  No  Yes      Weight  -  2lbs      Reps  -  10-15      Time  -  10 Minutes        Interval Training   Interval Training  No  No        NuStep   Level  3  3      SPM  85  85      Minutes  10  10      METs  2.8  2.4        Arm Ergometer   Level  1.5  2      Watts  20  20      Minutes  10  10      METs  2.64  2.6        Track   Laps  5  9      Minutes  10  10      METs  2  2.53         Exercise Comments: Exercise Comments    Row Name 02/22/18 0956 03/08/18 0809         Exercise Comments  Pt's first day of exercise. Pt responded well to exercise prescription. Will continue to monitor and progress pt as tolerated.   Pt is continuing to respond well to exercise prescription. Pt is able to exercise 30 minutes with no  difficulty. Will continue to montior and folllow up with pt regarding home exercies plans.  Exercise Goals and Review: Exercise Goals    Row Name 02/16/18 3474             Exercise Goals   Increase Physical Activity  Yes       Intervention  Provide advice, education, support and counseling about physical activity/exercise needs.;Develop an individualized exercise prescription for aerobic and resistive training based on initial evaluation findings, risk stratification, comorbidities and participant's personal goals.       Expected Outcomes  Short Term: Attend rehab on a regular basis to increase amount of physical activity.;Long Term: Add in home exercise to make exercise part of routine and to increase amount of physical activity.;Long Term: Exercising regularly at least 3-5 days a week.       Increase Strength and Stamina  Yes       Intervention  Provide advice, education, support and counseling about physical activity/exercise needs.;Develop an individualized exercise prescription for aerobic and resistive training based on initial evaluation findings, risk stratification, comorbidities and participant's personal goals.       Expected Outcomes  Short Term: Increase workloads from initial exercise prescription for resistance, speed, and METs.;Short Term: Perform resistance training exercises routinely during rehab and add in resistance training at home;Long Term: Improve cardiorespiratory fitness, muscular endurance and strength as measured by increased METs and functional capacity (6MWT)       Able to understand and use rate of perceived exertion (RPE) scale  Yes       Intervention  Provide education and explanation on how to use RPE scale       Expected Outcomes  Short Term: Able to use RPE daily in rehab to express subjective intensity level;Long Term:  Able to use RPE to guide intensity level when exercising independently       Knowledge and understanding of Target Heart Rate Range  (THRR)  Yes       Intervention  Provide education and explanation of THRR including how the numbers were predicted and where they are located for reference       Expected Outcomes  Short Term: Able to state/look up THRR;Long Term: Able to use THRR to govern intensity when exercising independently;Short Term: Able to use daily as guideline for intensity in rehab       Able to check pulse independently  Yes       Intervention  Provide education and demonstration on how to check pulse in carotid and radial arteries.;Review the importance of being able to check your own pulse for safety during independent exercise       Expected Outcomes  Short Term: Able to explain why pulse checking is important during independent exercise;Long Term: Able to check pulse independently and accurately       Understanding of Exercise Prescription  Yes       Intervention  Provide education, explanation, and written materials on patient's individual exercise prescription       Expected Outcomes  Short Term: Able to explain program exercise prescription;Long Term: Able to explain home exercise prescription to exercise independently          Exercise Goals Re-Evaluation :    Discharge Exercise Prescription (Final Exercise Prescription Changes): Exercise Prescription Changes - 03/06/18 0810      Response to Exercise   Blood Pressure (Admit)  112/80    Blood Pressure (Exercise)  120/70    Blood Pressure (Exit)  102/70    Heart Rate (Admit)  80 bpm    Heart Rate (Exercise)  106 bpm  Heart Rate (Exit)  82 bpm    Rating of Perceived Exertion (Exercise)  9    Perceived Dyspnea (Exercise)  0    Symptoms  None     Comments  None     Duration  Continue with 30 min of aerobic exercise without signs/symptoms of physical distress.    Intensity  THRR unchanged      Progression   Progression  Continue to progress workloads to maintain intensity without signs/symptoms of physical distress.    Average METs  2.53       Resistance Training   Training Prescription  Yes    Weight  2lbs    Reps  10-15    Time  10 Minutes      Interval Training   Interval Training  No      NuStep   Level  3    SPM  85    Minutes  10    METs  2.4      Arm Ergometer   Level  2    Watts  20    Minutes  10    METs  2.6      Track   Laps  9    Minutes  10    METs  2.53       Nutrition:  Target Goals: Understanding of nutrition guidelines, daily intake of sodium 1500mg , cholesterol 200mg , calories 30% from fat and 7% or less from saturated fats, daily to have 5 or more servings of fruits and vegetables.  Biometrics: Pre Biometrics - 02/16/18 1014      Pre Biometrics   Height  5' 2.5" (1.588 m)    Weight  64 kg    Waist Circumference  33.75 inches    Hip Circumference  39.75 inches    Waist to Hip Ratio  0.85 %    BMI (Calculated)  25.38    Triceps Skinfold  36 mm    % Body Fat  39.3 %    Grip Strength  28 kg    Flexibility  12.5 in    Single Leg Stand  30 seconds        Nutrition Therapy Plan and Nutrition Goals: Nutrition Therapy & Goals - 02/16/18 1214      Nutrition Therapy   Diet  consistent carbohydrate heart healthy      Personal Nutrition Goals   Nutrition Goal  Pt to identify and limit food sources of saturated fat, trans fat, and sodium    Personal Goal #2  Pt to identify and limit food sources of refined grains and simple sugars      Intervention Plan   Intervention  Prescribe, educate and counsel regarding individualized specific dietary modifications aiming towards targeted core components such as weight, hypertension, lipid management, diabetes, heart failure and other comorbidities.    Expected Outcomes  Short Term Goal: Understand basic principles of dietary content, such as calories, fat, sodium, cholesterol and nutrients.       Nutrition Assessments: Nutrition Assessments - 02/16/18 1216      MEDFICTS Scores   Pre Score  29       Nutrition Goals  Re-Evaluation: Nutrition Goals Re-Evaluation    Row Name 02/16/18 1215 02/16/18 1216           Goals   Current Weight  141 lb 1.5 oz (64 kg)  141 lb 1.5 oz (64 kg)      Nutrition Goal  Pt to identify and limit food sources of  saturated fat, trans fat, and sodium  Pt to identify and limit food sources of saturated fat, trans fat, and sodium        Personal Goal #2 Re-Evaluation   Personal Goal #2  -  Pt to identify and limit food sources of refined grains and simple sugars         Nutrition Goals Re-Evaluation: Nutrition Goals Re-Evaluation    [place] Name 02/16/18 1215 02/16/18 1216           Goals   Current Weight  141 lb 1.5 oz (64 kg)  141 lb 1.5 oz (64 kg)      Nutrition Goal  Pt to identify and limit food sources of saturated fat, trans fat, and sodium  Pt to identify and limit food sources of saturated fat, trans fat, and sodium        Personal Goal #2 Re-Evaluation   Personal Goal #2  -  Pt to identify and limit food sources of refined grains and simple sugars         Nutrition Goals Discharge (Final Nutrition Goals Re-Evaluation): Nutrition Goals Re-Evaluation - 02/16/18 1216      Goals   Current Weight  141 lb 1.5 oz (64 kg)    Nutrition Goal  Pt to identify and limit food sources of saturated fat, trans fat, and sodium      Personal Goal #2 Re-Evaluation   Personal Goal #2  Pt to identify and limit food sources of refined grains and simple sugars       Psychosocial: Target Goals: Acknowledge presence or absence of significant depression and/or stress, maximize coping skills, provide positive support system. Participant is able to verbalize types and ability to use techniques and skills needed for reducing stress and depression.  Initial Review & Psychosocial Screening: Initial Psych Review & Screening - 02/16/18 1043      Initial Review   Current issues with  None Identified      Family Dynamics   Good Support System?  Yes      Barriers   Psychosocial  barriers to participate in program  There are no identifiable barriers or psychosocial needs.      Screening Interventions   Interventions  Encouraged to exercise       Quality of Life Scores: Quality of Life - 02/16/18 1015      Quality of Life   Select  Quality of Life      Quality of Life Scores   Health/Function Pre  30 %    Socioeconomic Pre  27 %    Psych/Spiritual Pre  30 %    Family Pre  30 %    GLOBAL Pre  29.42 %      Scores of 19 and below usually indicate a poorer quality of life in these areas.  A difference of  2-3 points is a clinically meaningful difference.  A difference of 2-3 points in the total score of the Quality of Life Index has been associated with significant improvement in overall quality of life, self-image, physical symptoms, and general health in studies assessing change in quality of life.  PHQ-9: Recent Review Flowsheet Data    Depression screen [facility] 2/9 03/07/2018 02/22/2018 12/12/2017 07/28/2017 03/03/2017   Decreased Interest 0 0 0 0 0   Down, Depressed, Hopeless 0 0 0 0 0   PHQ - 2 Score 0 0 0 0 0     Interpretation of Total Score  Total Score Depression Severity:  1-4 =  Minimal depression, 5-9 = Mild depression, 10-14 = Moderate depression, 15-19 = Moderately severe depression, 20-27 = Severe depression   Psychosocial Evaluation and Intervention: Psychosocial Evaluation - 02/22/18 1020      Psychosocial Evaluation & Interventions   Interventions  Encouraged to exercise with the program and follow exercise prescription    Comments  No psychosocial needs identified. Rosaria enjoys visiting her brother in assisted living and shopping.    Expected Outcomes  Madia will maintain a positive outlook with good coping skills.    Continue Psychosocial Services   No Follow up required       Psychosocial Re-Evaluation: Psychosocial Re-Evaluation    [place] Name 03/06/18 [phone]             Psychosocial Re-Evaluation   Current issues with  None Identified        Comments  No psychosocial needs identified.        Expected Outcomes  [place] will continue to have a positive outlook with good coping skills.       Interventions  Encouraged to attend Cardiac Rehabilitation for the exercise       Continue Psychosocial Services   No Follow up required          Psychosocial Discharge (Final Psychosocial Re-Evaluation): Psychosocial Re-Evaluation - 03/06/18 3149      Psychosocial Re-Evaluation   Current issues with  None Identified    Comments  No psychosocial needs identified.     Expected Outcomes  Danial will continue to have a positive outlook with good coping skills.    Interventions  Encouraged to attend Cardiac Rehabilitation for the exercise    Continue Psychosocial Services   No Follow up required       Vocational Rehabilitation: Provide vocational rehab assistance to qualifying candidates.   Vocational Rehab Evaluation & Intervention: Vocational Rehab - 02/16/18 1043      Initial Vocational Rehab Evaluation & Intervention   Assessment shows need for Vocational Rehabilitation  No       Education: Education Goals: Education classes will be provided on a weekly basis, covering required topics. Participant will state understanding/return demonstration of topics presented.  Learning Barriers/Preferences: Learning Barriers/Preferences - 02/16/18 0820      Learning Barriers/Preferences   Learning Barriers  Sight    Learning Preferences  Written Material       Education Topics: Count Your Pulse:  -Group instruction provided by verbal instruction, demonstration, patient participation and written materials to support subject.  Instructors address importance of being able to find your pulse and how to count your pulse when at home without a heart monitor.  Patients get hands on experience counting their pulse with staff help and individually.   Heart Attack, Angina, and Risk Factor Modification:  -Group instruction provided by verbal  instruction, video, and written materials to support subject.  Instructors address signs and symptoms of angina and heart attacks.    Also discuss risk factors for heart disease and how to make changes to improve heart health risk factors.   Functional Fitness:  -Group instruction provided by verbal instruction, demonstration, patient participation, and written materials to support subject.  Instructors address safety measures for doing things around the house.  Discuss how to get up and down off the floor, how to pick things up properly, how to safely get out of a chair without assistance, and balance training.   Meditation and Mindfulness:  -Group instruction provided by verbal instruction, patient participation, and written materials to support subject.  Instructor addresses importance of mindfulness and meditation practice to help reduce stress and improve awareness.  Instructor also leads participants through a meditation exercise.    Stretching for Flexibility and Mobility:  -Group instruction provided by verbal instruction, patient participation, and written materials to support subject.  Instructors lead participants through series of stretches that are designed to increase flexibility thus improving mobility.  These stretches are additional exercise for major muscle groups that are typically performed during regular warm up and cool down.   Hands Only CPR:  -Group verbal, video, and participation provides a basic overview of AHA guidelines for community CPR. Role-play of emergencies allow participants the opportunity to practice calling for help and chest compression technique with discussion of AED use.   Hypertension: -Group verbal and written instruction that provides a basic overview of hypertension including the most recent diagnostic guidelines, risk factor reduction with self-care instructions and medication management.    Nutrition I class: Heart Healthy Eating:  -Group  instruction provided by PowerPoint slides, verbal discussion, and written materials to support subject matter. The instructor gives an explanation and review of the Therapeutic Lifestyle Changes diet recommendations, which includes a discussion on lipid goals, dietary fat, sodium, fiber, plant stanol/sterol esters, sugar, and the components of a well-balanced, healthy diet.   Nutrition II class: Lifestyle Skills:  -Group instruction provided by PowerPoint slides, verbal discussion, and written materials to support subject matter. The instructor gives an explanation and review of label reading, grocery shopping for heart health, heart healthy recipe modifications, and ways to make healthier choices when eating out.   Diabetes Question & Answer:  -Group instruction provided by PowerPoint slides, verbal discussion, and written materials to support subject matter. The instructor gives an explanation and review of diabetes co-morbidities, pre- and post-prandial blood glucose goals, pre-exercise blood glucose goals, signs, symptoms, and treatment of hypoglycemia and hyperglycemia, and foot care basics.   Diabetes Blitz:  -Group instruction provided by PowerPoint slides, verbal discussion, and written materials to support subject matter. The instructor gives an explanation and review of the physiology behind type 1 and type 2 diabetes, diabetes medications and rational behind using different medications, pre- and post-prandial blood glucose recommendations and Hemoglobin A1c goals, diabetes diet, and exercise including blood glucose guidelines for exercising safely.    Portion Distortion:  -Group instruction provided by PowerPoint slides, verbal discussion, written materials, and food models to support subject matter. The instructor gives an explanation of serving size versus portion size, changes in portions sizes over the last 20 years, and what consists of a serving from each food group.   Stress  Management:  -Group instruction provided by verbal instruction, video, and written materials to support subject matter.  Instructors review role of stress in heart disease and how to cope with stress positively.     Exercising on Your Own:  -Group instruction provided by verbal instruction, power point, and written materials to support subject.  Instructors discuss benefits of exercise, components of exercise, frequency and intensity of exercise, and end points for exercise.  Also discuss use of nitroglycerin and activating EMS.  Review options of places to exercise outside of rehab.  Review guidelines for sex with heart disease.   Cardiac Drugs I:  -Group instruction provided by verbal instruction and written materials to support subject.  Instructor reviews cardiac drug classes: antiplatelets, anticoagulants, beta blockers, and statins.  Instructor discusses reasons, side effects, and lifestyle considerations for each drug class.   Cardiac Drugs II:  -Group instruction  provided by verbal instruction and written materials to support subject.  Instructor reviews cardiac drug classes: angiotensin converting enzyme inhibitors (ACE-I), angiotensin II receptor blockers (ARBs), nitrates, and calcium channel blockers.  Instructor discusses reasons, side effects, and lifestyle considerations for each drug class.   Anatomy and Physiology of the Circulatory System:  Group verbal and written instruction and models provide basic cardiac anatomy and physiology, with the coronary electrical and arterial systems. Review of: AMI, Angina, Valve disease, Heart Failure, Peripheral Artery Disease, Cardiac Arrhythmia, Pacemakers, and the ICD.   CARDIAC REHAB PHASE II EXERCISE from 03/01/2018 in [place]  Date  03/01/18  Educator  RN  Instruction Review Code  2- Demonstrated Understanding      Other Education:  -Group or individual verbal, written, or video instructions that  support the educational goals of the cardiac rehab program.   Holiday Eating Survival Tips:  -Group instruction provided by PowerPoint slides, verbal discussion, and written materials to support subject matter. The instructor gives patients tips, tricks, and techniques to help them not only survive but enjoy the holidays despite the onslaught of food that accompanies the holidays.   Knowledge Questionnaire Score: Knowledge Questionnaire Score - 02/16/18 1014      Knowledge Questionnaire Score   Pre Score  18/24       Core Components/Risk Factors/Patient Goals at Admission: Personal Goals and Risk Factors at Admission - 02/16/18 1010      Core Components/Risk Factors/Patient Goals on Admission    Weight Management  Yes;Weight Loss    Intervention  Weight Management: Develop a combined nutrition and exercise program designed to reach desired caloric intake, while maintaining appropriate intake of nutrient and fiber, sodium and fats, and appropriate energy expenditure required for the weight goal.;Weight Management: Provide education and appropriate resources to help participant work on and attain dietary goals.;Weight Management/Obesity: Establish reasonable short term and long term weight goals.;Obesity: Provide education and appropriate resources to help participant work on and attain dietary goals.    Admit Weight  141 lb 1.5 oz (64 kg)    Goal Weight: Short Term  135 lb (61.2 kg)    Goal Weight: Long Term  131 lb (59.4 kg)    Expected Outcomes  Short Term: Continue to assess and modify interventions until short term weight is achieved;Long Term: Adherence to nutrition and physical activity/exercise program aimed toward attainment of established weight goal;Weight Loss: Understanding of general recommendations for a balanced deficit meal plan, which promotes 1-2 lb weight loss per week and includes a negative energy balance of [phone] kcal/d;Understanding recommendations for meals to  include 15-35% energy as protein, 25-35% energy from fat, 35-60% energy from carbohydrates, less than 200mg  of dietary cholesterol, 20-35 gm of total fiber daily;Understanding of distribution of calorie intake throughout the day with the consumption of 4-5 meals/snacks    Hypertension  Yes    Intervention  Provide education on lifestyle modifcations including regular physical activity/exercise, weight management, moderate sodium restriction and increased consumption of fresh fruit, vegetables, and low fat dairy, alcohol moderation, and smoking cessation.;Monitor prescription use compliance.    Expected Outcomes  Short Term: Continued assessment and intervention until BP is < 140/67mm HG in hypertensive participants. < 130/46mm HG in hypertensive participants with diabetes, heart failure or chronic kidney disease.;Long Term: Maintenance of blood pressure at goal levels.    Lipids  Yes    Intervention  Provide education and support for participant on nutrition & aerobic/resistive exercise along with prescribed medications  to achieve LDL 70mg , HDL >40mg .    Expected Outcomes  Short Term: Participant states understanding of desired cholesterol values and is compliant with medications prescribed. Participant is following exercise prescription and nutrition guidelines.;Long Term: Cholesterol controlled with medications as prescribed, with individualized exercise RX and with personalized nutrition plan. Value goals: LDL < 70mg , HDL > 40 mg.    Stress  Yes    Intervention  Offer individual and/or small group education and counseling on adjustment to heart disease, stress management and health-related lifestyle change. Teach and support self-help strategies.;Refer participants experiencing significant psychosocial distress to appropriate mental health specialists for further evaluation and treatment. When possible, include family members and significant others in education/counseling sessions.    Expected Outcomes   Short Term: Participant demonstrates changes in health-related behavior, relaxation and other stress management skills, ability to obtain effective social support, and compliance with psychotropic medications if prescribed.;Long Term: Emotional wellbeing is indicated by absence of clinically significant psychosocial distress or social isolation.       Core Components/Risk Factors/Patient Goals Review:  Goals and Risk Factor Review    Row Name 02/22/18 8453 02/22/18 1018 03/06/18 0838         Core Components/Risk Factors/Patient Goals Review   Personal Goals Review  Weight Management/Obesity;Hypertension;Stress;Lipids  (Pended)   Weight Management/Obesity;Hypertension;Stress;Lipids  Weight Management/Obesity;Hypertension;Stress;Lipids     Review  -  Pt with multiple CAD RFs willing to participate in CR Program. Shantika would like to get back to volunteering at her church.  Pt with multiple CAD RFs willing to participate in CR Program. Raianna is tolerating exercise well with her recent start in the program.     Expected Outcomes  -  Pt will continue to participate in CR exercise, nutrition, and lifestlye opportunities.   Pt will continue to participate in CR exercise, nutrition, and lifestlye opportunities.         Core Components/Risk Factors/Patient Goals at Discharge (Final Review):  Goals and Risk Factor Review - 03/06/18 0838      Core Components/Risk Factors/Patient Goals Review   Personal Goals Review  Weight Management/Obesity;Hypertension;Stress;Lipids    Review  Pt with multiple CAD RFs willing to participate in CR Program. Melodi is tolerating exercise well with her recent start in the program.    Expected Outcomes  Pt will continue to participate in CR exercise, nutrition, and lifestlye opportunities.        ITP Comments: ITP Comments    Row Name 02/16/18 0817 02/22/18 0920 03/06/18 0834       ITP Comments  Dr. Fransico Him, Medical Director  [facility] started exercise today and  tolerated it well.   30 Day ITP Review.  Shrinika has started CR recently and is tolerating it well. VSS.        Comments: See ITP Comments.

## 2018-03-10 ENCOUNTER — Encounter (HOSPITAL_COMMUNITY): Payer: Medicare Other

## 2018-03-10 ENCOUNTER — Encounter (HOSPITAL_COMMUNITY)
Admission: RE | Admit: 2018-03-10 | Discharge: 2018-03-10 | Disposition: A | Discharge status: Home / Self Care | Payer: Medicare Other | Source: Ambulatory Visit | Attending: Cardiology | Admitting: Cardiology

## 2018-03-10 DIAGNOSIS — Z9889 Other specified postprocedural states: Secondary | ICD-10-CM | POA: Diagnosis not present

## 2018-03-13 ENCOUNTER — Encounter (HOSPITAL_COMMUNITY)
Admission: RE | Admit: 2018-03-13 | Discharge: 2018-03-13 | Disposition: A | Discharge status: Home / Self Care | Payer: Medicare Other | Source: Ambulatory Visit | Attending: Cardiology | Admitting: Cardiology

## 2018-03-13 ENCOUNTER — Encounter (HOSPITAL_COMMUNITY): Payer: Medicare Other

## 2018-03-13 DIAGNOSIS — Z9889 Other specified postprocedural states: Secondary | ICD-10-CM

## 2018-03-15 ENCOUNTER — Encounter (HOSPITAL_COMMUNITY)
Admission: RE | Admit: 2018-03-15 | Discharge: 2018-03-15 | Disposition: A | Discharge status: Home / Self Care | Payer: Medicare Other | Source: Ambulatory Visit | Attending: Cardiology | Admitting: Cardiology

## 2018-03-15 ENCOUNTER — Encounter (HOSPITAL_COMMUNITY): Payer: Medicare Other

## 2018-03-15 DIAGNOSIS — Z9889 Other specified postprocedural states: Secondary | ICD-10-CM | POA: Diagnosis not present

## 2018-03-17 ENCOUNTER — Encounter (HOSPITAL_COMMUNITY)
Admission: RE | Admit: 2018-03-17 | Discharge: 2018-03-17 | Disposition: A | Discharge status: Home / Self Care | Payer: Medicare Other | Source: Ambulatory Visit | Attending: Cardiology | Admitting: Cardiology

## 2018-03-17 ENCOUNTER — Encounter (HOSPITAL_COMMUNITY): Payer: Medicare Other

## 2018-03-17 DIAGNOSIS — Z9889 Other specified postprocedural states: Secondary | ICD-10-CM | POA: Diagnosis not present

## 2018-03-20 ENCOUNTER — Encounter (HOSPITAL_COMMUNITY)
Admission: RE | Admit: 2018-03-20 | Discharge: 2018-03-20 | Disposition: A | Discharge status: Home / Self Care | Payer: Medicare Other | Source: Ambulatory Visit | Attending: Cardiology | Admitting: Cardiology

## 2018-03-20 ENCOUNTER — Encounter (HOSPITAL_COMMUNITY): Payer: Medicare Other

## 2018-03-20 DIAGNOSIS — Z9889 Other specified postprocedural states: Secondary | ICD-10-CM | POA: Diagnosis not present

## 2018-03-22 ENCOUNTER — Encounter (HOSPITAL_COMMUNITY): Payer: Medicare Other

## 2018-03-22 ENCOUNTER — Encounter (HOSPITAL_COMMUNITY)
Admission: RE | Admit: 2018-03-22 | Discharge: 2018-03-22 | Disposition: A | Discharge status: Home / Self Care | Payer: Medicare Other | Source: Ambulatory Visit | Attending: Cardiology | Admitting: Cardiology

## 2018-03-22 DIAGNOSIS — Z9889 Other specified postprocedural states: Secondary | ICD-10-CM

## 2018-03-22 NOTE — Progress Notes (Signed)
I have reviewed a Home Exercise Prescription with [name] . Brodie is currently exercising at home. The patient was advised to continue to walk 2-3 days a week for 20-30 minutes.  Faryal and I discussed how to progress their exercise prescription. The patient stated that they understand the exercise prescription. We reviewed exercise guidelines, target heart rate during exercise, RPE Scale, weather conditions, NTG use, endpoints for exercise, warmup and cool down. Patient is encouraged to come to me with any questions. I will continue to follow up with the patient to assist them with progression and safety.    Carma Lair MS, ACSM CEP 10:44 AM 03/22/2018

## 2018-03-24 ENCOUNTER — Encounter (HOSPITAL_COMMUNITY): Payer: Medicare Other

## 2018-03-24 ENCOUNTER — Encounter (HOSPITAL_COMMUNITY)
Admission: RE | Admit: 2018-03-24 | Discharge: 2018-03-24 | Disposition: A | Discharge status: Home / Self Care | Payer: Medicare Other | Source: Ambulatory Visit | Attending: Cardiology | Admitting: Cardiology

## 2018-03-24 DIAGNOSIS — Z9889 Other specified postprocedural states: Secondary | ICD-10-CM | POA: Diagnosis not present

## 2018-03-27 ENCOUNTER — Encounter (HOSPITAL_COMMUNITY)
Admission: RE | Admit: 2018-03-27 | Discharge: 2018-03-27 | Disposition: A | Discharge status: Home / Self Care | Payer: Medicare Other | Source: Ambulatory Visit | Attending: Cardiology | Admitting: Cardiology

## 2018-03-27 ENCOUNTER — Encounter (HOSPITAL_COMMUNITY): Payer: Medicare Other

## 2018-03-27 DIAGNOSIS — Z9889 Other specified postprocedural states: Secondary | ICD-10-CM

## 2018-03-29 ENCOUNTER — Encounter (HOSPITAL_COMMUNITY)
Admission: RE | Admit: 2018-03-29 | Discharge: 2018-03-29 | Disposition: A | Discharge status: Home / Self Care | Payer: Medicare Other | Source: Ambulatory Visit | Attending: Cardiology | Admitting: Cardiology

## 2018-03-29 ENCOUNTER — Encounter (HOSPITAL_COMMUNITY): Payer: Medicare Other

## 2018-03-29 DIAGNOSIS — Z9889 Other specified postprocedural states: Secondary | ICD-10-CM

## 2018-03-31 ENCOUNTER — Encounter (HOSPITAL_COMMUNITY): Payer: Medicare Other

## 2018-03-31 ENCOUNTER — Encounter (HOSPITAL_COMMUNITY): Payer: Self-pay

## 2018-03-31 ENCOUNTER — Encounter (HOSPITAL_COMMUNITY)
Admission: RE | Admit: 2018-03-31 | Discharge: 2018-03-31 | Disposition: A | Discharge status: Home / Self Care | Payer: Medicare Other | Source: Ambulatory Visit | Attending: Cardiology | Admitting: Cardiology

## 2018-03-31 DIAGNOSIS — Z9889 Other specified postprocedural states: Secondary | ICD-10-CM | POA: Diagnosis not present

## 2018-04-04 ENCOUNTER — Ambulatory Visit (INDEPENDENT_AMBULATORY_CARE_PROVIDER_SITE_OTHER): Payer: Medicare Other | Admitting: *Deleted

## 2018-04-04 DIAGNOSIS — I639 Cerebral infarction, unspecified: Secondary | ICD-10-CM

## 2018-04-05 ENCOUNTER — Encounter (HOSPITAL_COMMUNITY)
Admission: RE | Admit: 2018-04-05 | Discharge: 2018-04-05 | Disposition: A | Discharge status: Home / Self Care | Payer: Medicare Other | Source: Ambulatory Visit | Attending: Cardiology | Admitting: Cardiology

## 2018-04-05 ENCOUNTER — Encounter (HOSPITAL_COMMUNITY): Payer: Medicare Other

## 2018-04-05 DIAGNOSIS — Z7902 Long term (current) use of antithrombotics/antiplatelets: Secondary | ICD-10-CM | POA: Diagnosis not present

## 2018-04-05 DIAGNOSIS — Z95818 Presence of other cardiac implants and grafts: Secondary | ICD-10-CM | POA: Diagnosis not present

## 2018-04-05 DIAGNOSIS — Z7982 Long term (current) use of aspirin: Secondary | ICD-10-CM | POA: Insufficient documentation

## 2018-04-05 DIAGNOSIS — Z9889 Other specified postprocedural states: Secondary | ICD-10-CM | POA: Diagnosis not present

## 2018-04-05 DIAGNOSIS — I5042 Chronic combined systolic (congestive) and diastolic (congestive) heart failure: Secondary | ICD-10-CM | POA: Insufficient documentation

## 2018-04-05 DIAGNOSIS — Z79899 Other long term (current) drug therapy: Secondary | ICD-10-CM | POA: Diagnosis not present

## 2018-04-05 DIAGNOSIS — N183 Chronic kidney disease, stage 3 (moderate): Secondary | ICD-10-CM | POA: Insufficient documentation

## 2018-04-05 DIAGNOSIS — Z79890 Hormone replacement therapy (postmenopausal): Secondary | ICD-10-CM | POA: Insufficient documentation

## 2018-04-05 DIAGNOSIS — I130 Hypertensive heart and chronic kidney disease with heart failure and stage 1 through stage 4 chronic kidney disease, or unspecified chronic kidney disease: Secondary | ICD-10-CM | POA: Insufficient documentation

## 2018-04-05 DIAGNOSIS — E7800 Pure hypercholesterolemia, unspecified: Secondary | ICD-10-CM | POA: Insufficient documentation

## 2018-04-05 DIAGNOSIS — E039 Hypothyroidism, unspecified: Secondary | ICD-10-CM | POA: Insufficient documentation

## 2018-04-05 NOTE — Progress Notes (Signed)
Carelink Summary Report / Loop Recorder 

## 2018-04-06 NOTE — Progress Notes (Signed)
Cardiac Individual Treatment Plan  Patient Details  Name: CHAKIRA JACHIM MRN: [id number] Date of Birth: 09/21/1945 Referring Provider:     CARDIAC REHAB PHASE II ORIENTATION from 02/16/2018 in [place]  Referring Provider  Larey Dresser MD       Initial Encounter Date:    CARDIAC REHAB PHASE II ORIENTATION from 02/16/2018 in [place]  Date  02/16/18      Visit Diagnosis: 11/30/17 S/P MVR (mitral valve repair)  Patient's Home Medications on Admission:  Current Outpatient Medications:  .  acetaminophen (TYLENOL) 500 MG tablet, Take 1,000 mg by mouth every 6 (six) hours as needed for headache (pain)., Disp: , Rfl:  .  amoxicillin (AMOXIL) 500 MG tablet, Take 4 tablets (2,000 mg) 1 hour prior to dental procedures., Disp: 8 tablet, Rfl: 6 .  aspirin 81 MG tablet, Take 1 tablet (81 mg total) daily by mouth., Disp: , Rfl:  .  butalbital-acetaminophen-caffeine (FIORICET, ESGIC) 50-325-40 MG tablet, Take 1 tablet by mouth every 12 (twelve) hours as needed for headache., Disp: 14 tablet, Rfl: 0 .  carvedilol (COREG) 6.25 MG tablet, Take 0.5 tablets (3.125 mg total) by mouth 2 (two) times daily., Disp: 180 tablet, Rfl: 0 .  clopidogrel (PLAVIX) 75 MG tablet, Take 1 tablet (75 mg total) by mouth daily., Disp: 30 tablet, Rfl: 1 .  feeding supplement, ENSURE ENLIVE, (ENSURE ENLIVE) LIQD, Take 237 mLs by mouth 2 (two) times daily between meals., Disp: 237 mL, Rfl: 0 .  furosemide (LASIX) 80 MG tablet, Take 1 tablet (80 mg total) by mouth 2 (two) times daily., Disp: 60 tablet, Rfl: 0 .  Hypromellose (ARTIFICIAL TEARS OP), Place 1 drop into both eyes daily as needed (dry eyes)., Disp: , Rfl:  .  levothyroxine (SYNTHROID, LEVOTHROID) 25 MCG tablet, Take 25 mcg by mouth daily before breakfast., Disp: , Rfl:  .  potassium chloride SA (K-DUR,KLOR-CON) 20 MEQ tablet, Take 1 tablet (20 mEq total) by mouth 2 (two) times daily., Disp: 60 tablet,  Rfl: 0 .  pravastatin (PRAVACHOL) 80 MG tablet, Take 1 tablet (80 mg total) by mouth at bedtime., Disp: 30 tablet, Rfl: 11  Past Medical History: Past Medical History:  Diagnosis Date  . 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 Cancer [facility])    kidney cancer  . Chronic combined systolic and diastolic CHF (congestive heart failure) ([place])   . 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 Dyspnea   . Headache   . History of kidney cancer    a. s/p right nephrectomy ~2007.  Marland Kitchen History of loop recorder   . Hypercholesterolemia   . Hypertension   . Hypothyroidism   . Mitral regurgitation   . Pulmonary hypertension ([place])   . Stroke [facility])    no residual    Tobacco Use: Social History   Tobacco Use  Smoking Status Never Smoker  Smokeless Tobacco Never Used    Labs: Recent Review Flowsheet Data    Labs for ITP Cardiac and Pulmonary Rehab Latest Ref Rng & Units 02/10/2017 05/09/2017 09/08/2017 11/25/2017 01/05/2018   Cholestrol 0 - 200 mg/dL 159 105 126 - -   LDLCALC 0 - 99 mg/dL 88 56 69 - -   HDL >40 mg/dL 58 39(L) 52 - -   Trlycerides <150 mg/dL 66 48 24 - -   Hemoglobin A1c 4.8 - 5.6 % 5.6 5.2 -  5.5 -   PHART 7.350 - 7.450 - - - 7.441 -   PCO2ART 32.0 - 48.0 mmHg - - - 42.3 -   HCO3 20.0 - 28.0 mmol/L - - - 28.3(H) 28.8(H)   TCO2 22 - 32 mmol/L - - - - 30   O2SAT % - - - 98.4 71.0      Capillary Blood Glucose: Lab Results  Component Value Date   GLUCAP 84 01/08/2016   GLUCAP 80 01/08/2016     Exercise Target Goals: Exercise Program Goal: Individual exercise prescription set using results from initial 6 min walk test and THRR while considering  patient's activity barriers and safety.   Exercise Prescription Goal: Initial exercise prescription builds to 30-45 minutes a day of aerobic activity, 2-3 days per week.  Home exercise guidelines will be given to patient during program as part of exercise prescription that the participant will  acknowledge.  Activity Barriers & Risk Stratification: Activity Barriers & Cardiac Risk Stratification - 02/16/18 0821      Activity Barriers & Cardiac Risk Stratification   Activity Barriers  Joint Problems;Deconditioning;Muscular Weakness;Back Problems;Arthritis;Other (comment)    Comments  carpal tunnel syndrome in hands    Cardiac Risk Stratification  High       6 Minute Walk: 6 Minute Walk    Row Name 02/16/18 1013         6 Minute Walk   Phase  Initial     Distance  1052 feet     Walk Time  6 minutes     # of Rest Breaks  0     MPH  1.99     METS  2.01     RPE  12     Perceived Dyspnea   0     VO2 Peak  7.03     Symptoms  No     Resting HR  69 bpm     Resting BP  100/54     Resting Oxygen Saturation   98 %     Exercise Oxygen Saturation  during 6 min walk  97 %     Max Ex. HR  78 bpm     Max Ex. BP  104/60     2 Minute Post BP  104/70        Oxygen Initial Assessment:   Oxygen Re-Evaluation:   Oxygen Discharge (Final Oxygen Re-Evaluation):   Initial Exercise Prescription: Initial Exercise Prescription - 02/16/18 1000      Date of Initial Exercise RX and Referring Provider   Date  02/16/18    Referring Provider  Larey Dresser MD     Expected Discharge Date  05/26/18      NuStep   Level  2    SPM  75    Minutes  10    METs  2      Arm Ergometer   Level  1.5    Watts  20    Minutes  10    METs  2.61      Track   Laps  9    Minutes  10    METs  2.53      Prescription Details   Frequency (times per week)  3x    Duration  Progress to 30 minutes of continuous aerobic without signs/symptoms of physical distress      Intensity   THRR 40-80% of Max Heartrate  59-118    Ratings of Perceived Exertion  11-13  Perceived Dyspnea  0-4      Progression   Progression  Continue progressive overload as per policy without signs/symptoms or physical distress.      Resistance Training   Training Prescription  Yes    Weight  2lbs    Reps   10-15       Perform Capillary Blood Glucose checks as needed.  Exercise Prescription Changes: Exercise Prescription Changes    Row Name 02/22/18 0900 03/06/18 0810 03/22/18 1510 03/31/18 1448       Response to Exercise   Blood Pressure (Admit)  110/62  112/80  126/70  110/70    Blood Pressure (Exercise)  120/80  120/70  110/68  118/70    Blood Pressure (Exit)  114/80  102/70  104/60  98/62    Heart Rate (Admit)  85 bpm  80 bpm  94 bpm  88 bpm    Heart Rate (Exercise)  93 bpm  106 bpm  116 bpm  114 bpm    Heart Rate (Exit)  66 bpm  82 bpm  75 bpm  82 bpm    Rating of Perceived Exertion (Exercise)  13  9  11  11     Perceived Dyspnea (Exercise)  0  0  0  0    Symptoms  None   None   None   None     Comments  Pt oriented to exercise equipment   None   None   -    Duration  Progress to 30 minutes of  aerobic without signs/symptoms of physical distress  Continue with 30 min of aerobic exercise without signs/symptoms of physical distress.  Continue with 30 min of aerobic exercise without signs/symptoms of physical distress.  Continue with 30 min of aerobic exercise without signs/symptoms of physical distress.    Intensity  THRR New  THRR unchanged  THRR unchanged  THRR unchanged      Progression   Progression  Continue to progress workloads to maintain intensity without signs/symptoms of physical distress.  Continue to progress workloads to maintain intensity without signs/symptoms of physical distress.  Continue to progress workloads to maintain intensity without signs/symptoms of physical distress.  Continue to progress workloads to maintain intensity without signs/symptoms of physical distress.    Average METs  2.44  2.53  2.7  3.13      Resistance Training   Training Prescription  No  Yes  No  Yes    Weight  -  2lbs  -  3lbs    Reps  -  10-15  -  10-15    Time  -  10 Minutes  -  10 Minutes      Interval Training   Interval Training  No  No  No  No      NuStep   Level  3  3  3  3      SPM  85  85  80  85    Minutes  10  10  10  10     METs  2.8  2.4  2.2  3      Arm Ergometer   Level  1.5  2  2  2     Watts  20  20  25   42    Minutes  10  10  10  10     METs  2.64  2.6  3  3.45      Track   Laps  5  9  11   12  Minutes  10  10  10  10     METs  2  2.53  2.91  3.07      Home Exercise Plan   Plans to continue exercise at  -  -  Home (comment) Walking  Home (comment) Walking    Frequency  -  -  Add 2 additional days to program exercise sessions.  Add 2 additional days to program exercise sessions.    Initial Home Exercises Provided  -  -  03/17/18  03/17/18       Exercise Comments: Exercise Comments    Row Name 02/22/18 0956 03/08/18 0809 03/22/18 1045       Exercise Comments  Pt's first day of exercise. Pt responded well to exercise prescription. Will continue to monitor and progress pt as tolerated.   Pt is continuing to respond well to exercise prescription. Pt is able to exercise 30 minutes with no difficulty. Will continue to montior and folllow up with pt regarding home exercies plans.   Reviewed HEP with pt. Pt is currently walking at home. Will continue to follow up with pt regarding exercise at home. Will continue to monitor and progress pt.         Exercise Goals and Review: Exercise Goals    Row Name 02/16/18 1610             Exercise Goals   Increase Physical Activity  Yes       Intervention  Provide advice, education, support and counseling about physical activity/exercise needs.;Develop an individualized exercise prescription for aerobic and resistive training based on initial evaluation findings, risk stratification, comorbidities and participant's personal goals.       Expected Outcomes  Short Term: Attend rehab on a regular basis to increase amount of physical activity.;Long Term: Add in home exercise to make exercise part of routine and to increase amount of physical activity.;Long Term: Exercising regularly at least 3-5 days a week.        Increase Strength and Stamina  Yes       Intervention  Provide advice, education, support and counseling about physical activity/exercise needs.;Develop an individualized exercise prescription for aerobic and resistive training based on initial evaluation findings, risk stratification, comorbidities and participant's personal goals.       Expected Outcomes  Short Term: Increase workloads from initial exercise prescription for resistance, speed, and METs.;Short Term: Perform resistance training exercises routinely during rehab and add in resistance training at home;Long Term: Improve cardiorespiratory fitness, muscular endurance and strength as measured by increased METs and functional capacity (6MWT)       Able to understand and use rate of perceived exertion (RPE) scale  Yes       Intervention  Provide education and explanation on how to use RPE scale       Expected Outcomes  Short Term: Able to use RPE daily in rehab to express subjective intensity level;Long Term:  Able to use RPE to guide intensity level when exercising independently       Knowledge and understanding of Target Heart Rate Range (THRR)  Yes       Intervention  Provide education and explanation of THRR including how the numbers were predicted and where they are located for reference       Expected Outcomes  Short Term: Able to state/look up THRR;Long Term: Able to use THRR to govern intensity when exercising independently;Short Term: Able to use daily as guideline for intensity in rehab  Able to check pulse independently  Yes       Intervention  Provide education and demonstration on how to check pulse in carotid and radial arteries.;Review the importance of being able to check your own pulse for safety during independent exercise       Expected Outcomes  Short Term: Able to explain why pulse checking is important during independent exercise;Long Term: Able to check pulse independently and accurately       Understanding of Exercise  Prescription  Yes       Intervention  Provide education, explanation, and written materials on patient's individual exercise prescription       Expected Outcomes  Short Term: Able to explain program exercise prescription;Long Term: Able to explain home exercise prescription to exercise independently          Exercise Goals Re-Evaluation : Exercise Goals Re-Evaluation    Row Name 03/22/18 1046             Exercise Goal Re-Evaluation   Exercise Goals Review  Increase Physical Activity;Able to understand and use rate of perceived exertion (RPE) scale;Knowledge and understanding of Target Heart Rate Range (THRR);Understanding of Exercise Prescription;Improve claudication pain tolerance and improve walking ability;Able to check pulse independently;Increase Strength and Stamina       Comments  Reviewed HEP with pt. Also reviewed THRR, RPE Scale, weather precautions, endpoints of exercise, NTG use, warmup and cool down.        Expected Outcomes  Pt will continue to walk 2-3 days a week for 20-30 minutes. Pt will continue to increase cardiorespiratory fitness. Will continue to monitor.           Discharge Exercise Prescription (Final Exercise Prescription Changes): Exercise Prescription Changes - 03/31/18 1448      Response to Exercise   Blood Pressure (Admit)  110/70    Blood Pressure (Exercise)  118/70    Blood Pressure (Exit)  98/62    Heart Rate (Admit)  88 bpm    Heart Rate (Exercise)  114 bpm    Heart Rate (Exit)  82 bpm    Rating of Perceived Exertion (Exercise)  11    Perceived Dyspnea (Exercise)  0    Symptoms  None     Duration  Continue with 30 min of aerobic exercise without signs/symptoms of physical distress.    Intensity  THRR unchanged      Progression   Progression  Continue to progress workloads to maintain intensity without signs/symptoms of physical distress.    Average METs  3.13      Resistance Training   Training Prescription  Yes    Weight  3lbs    Reps   10-15    Time  10 Minutes      Interval Training   Interval Training  No      NuStep   Level  3    SPM  85    Minutes  10    METs  3      Arm Ergometer   Level  2    Watts  42    Minutes  10    METs  3.45      Track   Laps  12    Minutes  10    METs  3.07      Home Exercise Plan   Plans to continue exercise at  Home (comment)   Walking   Frequency  Add 2 additional days to program exercise sessions.    Initial  Home Exercises Provided  03/17/18       Nutrition:  Target Goals: Understanding of nutrition guidelines, daily intake of sodium 1500mg , cholesterol 200mg , calories 30% from fat and 7% or less from saturated fats, daily to have 5 or more servings of fruits and vegetables.  Biometrics: Pre Biometrics - 02/16/18 1014      Pre Biometrics   Height  5' 2.5" (1.588 m)    Weight  64 kg    Waist Circumference  33.75 inches    Hip Circumference  39.75 inches    Waist to Hip Ratio  0.85 %    BMI (Calculated)  25.38    Triceps Skinfold  36 mm    % Body Fat  39.3 %    Grip Strength  28 kg    Flexibility  12.5 in    Single Leg Stand  30 seconds        Nutrition Therapy Plan and Nutrition Goals: Nutrition Therapy & Goals - 02/16/18 1214      Nutrition Therapy   Diet  consistent carbohydrate heart healthy      Personal Nutrition Goals   Nutrition Goal  Pt to identify and limit food sources of saturated fat, trans fat, and sodium    Personal Goal #2  Pt to identify and limit food sources of refined grains and simple sugars      Intervention Plan   Intervention  Prescribe, educate and counsel regarding individualized specific dietary modifications aiming towards targeted core components such as weight, hypertension, lipid management, diabetes, heart failure and other comorbidities.    Expected Outcomes  Short Term Goal: Understand basic principles of dietary content, such as calories, fat, sodium, cholesterol and nutrients.       Nutrition  Assessments: Nutrition Assessments - 02/16/18 1216      MEDFICTS Scores   Pre Score  29       Nutrition Goals Re-Evaluation: Nutrition Goals Re-Evaluation    Row Name 02/16/18 1215 02/16/18 1216           Goals   Current Weight  141 lb 1.5 oz (64 kg)  141 lb 1.5 oz (64 kg)      Nutrition Goal  Pt to identify and limit food sources of saturated fat, trans fat, and sodium  Pt to identify and limit food sources of saturated fat, trans fat, and sodium        Personal Goal #2 Re-Evaluation   Personal Goal #2  -  Pt to identify and limit food sources of refined grains and simple sugars         Nutrition Goals Re-Evaluation: Nutrition Goals Re-Evaluation    [place] Name 02/16/18 1215 02/16/18 1216           Goals   Current Weight  141 lb 1.5 oz (64 kg)  141 lb 1.5 oz (64 kg)      Nutrition Goal  Pt to identify and limit food sources of saturated fat, trans fat, and sodium  Pt to identify and limit food sources of saturated fat, trans fat, and sodium        Personal Goal #2 Re-Evaluation   Personal Goal #2  -  Pt to identify and limit food sources of refined grains and simple sugars         Nutrition Goals Discharge (Final Nutrition Goals Re-Evaluation): Nutrition Goals Re-Evaluation - 02/16/18 1216      Goals   Current Weight  141 lb 1.5 oz (64 kg)  Nutrition Goal  Pt to identify and limit food sources of saturated fat, trans fat, and sodium      Personal Goal #2 Re-Evaluation   Personal Goal #2  Pt to identify and limit food sources of refined grains and simple sugars       Psychosocial: Target Goals: Acknowledge presence or absence of significant depression and/or stress, maximize coping skills, provide positive support system. Participant is able to verbalize types and ability to use techniques and skills needed for reducing stress and depression.  Initial Review & Psychosocial Screening: Initial Psych Review & Screening - 02/16/18 1043      Initial Review    Current issues with  None Identified      Family Dynamics   Good Support System?  Yes   Charnise reports her family and friends as sources of support.      Barriers   Psychosocial barriers to participate in program  There are no identifiable barriers or psychosocial needs.      Screening Interventions   Interventions  Encouraged to exercise       Quality of Life Scores: Quality of Life - 02/16/18 1015      Quality of Life   Select  Quality of Life      Quality of Life Scores   Health/Function Pre  30 %    Socioeconomic Pre  27 %    Psych/Spiritual Pre  30 %    Family Pre  30 %    GLOBAL Pre  29.42 %      Scores of 19 and below usually indicate a poorer quality of life in these areas.  A difference of  2-3 points is a clinically meaningful difference.  A difference of 2-3 points in the total score of the Quality of Life Index has been associated with significant improvement in overall quality of life, self-image, physical symptoms, and general health in studies assessing change in quality of life.  PHQ-9: Recent Review Flowsheet Data    Depression screen [facility] 2/9 03/07/2018 02/22/2018 12/12/2017 07/28/2017 03/03/2017   Decreased Interest 0 0 0 0 0   Down, Depressed, Hopeless 0 0 0 0 0   PHQ - 2 Score 0 0 0 0 0     Interpretation of Total Score  Total Score Depression Severity:  1-4 = Minimal depression, 5-9 = Mild depression, 10-14 = Moderate depression, 15-19 = Moderately severe depression, 20-27 = Severe depression   Psychosocial Evaluation and Intervention: Psychosocial Evaluation - 02/22/18 1020      Psychosocial Evaluation & Interventions   Interventions  Encouraged to exercise with the program and follow exercise prescription    Comments  No psychosocial needs identified. Anastassia enjoys visiting her brother in assisted living and shopping.    Expected Outcomes  Adalind will maintain a positive outlook with good coping skills.    Continue Psychosocial Services   No Follow up  required       Psychosocial Re-Evaluation: Psychosocial Re-Evaluation    [place] Name 03/06/18 7096 03/31/18 1433           Psychosocial Re-Evaluation   Current issues with  None Identified  None Identified      Comments  No psychosocial needs identified.   No psychosocial needs identified.       Expected Outcomes  Israella will continue to have a positive outlook with good coping skills.  Jassica will continue to have a positive outlook with good coping skills.      Interventions  Encouraged  to attend Cardiac Rehabilitation for the exercise  Encouraged to attend Cardiac Rehabilitation for the exercise      Continue Psychosocial Services   No Follow up required  No Follow up required         Psychosocial Discharge (Final Psychosocial Re-Evaluation): Psychosocial Re-Evaluation - 03/31/18 1433      Psychosocial Re-Evaluation   Current issues with  None Identified    Comments  No psychosocial needs identified.     Expected Outcomes  Kadiatou will continue to have a positive outlook with good coping skills.    Interventions  Encouraged to attend Cardiac Rehabilitation for the exercise    Continue Psychosocial Services   No Follow up required       Vocational Rehabilitation: Provide vocational rehab assistance to qualifying candidates.   Vocational Rehab Evaluation & Intervention: Vocational Rehab - 02/16/18 1043      Initial Vocational Rehab Evaluation & Intervention   Assessment shows need for Vocational Rehabilitation  No       Education: Education Goals: Education classes will be provided on a weekly basis, covering required topics. Participant will state understanding/return demonstration of topics presented.  Learning Barriers/Preferences: Learning Barriers/Preferences - 02/16/18 0820      Learning Barriers/Preferences   Learning Barriers  Sight    Learning Preferences  Written Material       Education Topics: Count Your Pulse:  -Group instruction provided by verbal  instruction, demonstration, patient participation and written materials to support subject.  Instructors address importance of being able to find your pulse and how to count your pulse when at home without a heart monitor.  Patients get hands on experience counting their pulse with staff help and individually.   Heart Attack, Angina, and Risk Factor Modification:  -Group instruction provided by verbal instruction, video, and written materials to support subject.  Instructors address signs and symptoms of angina and heart attacks.    Also discuss risk factors for heart disease and how to make changes to improve heart health risk factors.   Functional Fitness:  -Group instruction provided by verbal instruction, demonstration, patient participation, and written materials to support subject.  Instructors address safety measures for doing things around the house.  Discuss how to get up and down off the floor, how to pick things up properly, how to safely get out of a chair without assistance, and balance training.   Meditation and Mindfulness:  -Group instruction provided by verbal instruction, patient participation, and written materials to support subject.  Instructor addresses importance of mindfulness and meditation practice to help reduce stress and improve awareness.  Instructor also leads participants through a meditation exercise.    Stretching for Flexibility and Mobility:  -Group instruction provided by verbal instruction, patient participation, and written materials to support subject.  Instructors lead participants through series of stretches that are designed to increase flexibility thus improving mobility.  These stretches are additional exercise for major muscle groups that are typically performed during regular warm up and cool down.   Hands Only CPR:  -Group verbal, video, and participation provides a basic overview of AHA guidelines for community CPR. Role-play of emergencies allow  participants the opportunity to practice calling for help and chest compression technique with discussion of AED use.   Hypertension: -Group verbal and written instruction that provides a basic overview of hypertension including the most recent diagnostic guidelines, risk factor reduction with self-care instructions and medication management.    Nutrition I class: Heart Healthy Eating:  -Group instruction  provided by PowerPoint slides, verbal discussion, and written materials to support subject matter. The instructor gives an explanation and review of the Therapeutic Lifestyle Changes diet recommendations, which includes a discussion on lipid goals, dietary fat, sodium, fiber, plant stanol/sterol esters, sugar, and the components of a well-balanced, healthy diet.   Nutrition II class: Lifestyle Skills:  -Group instruction provided by PowerPoint slides, verbal discussion, and written materials to support subject matter. The instructor gives an explanation and review of label reading, grocery shopping for heart health, heart healthy recipe modifications, and ways to make healthier choices when eating out.   Diabetes Question & Answer:  -Group instruction provided by PowerPoint slides, verbal discussion, and written materials to support subject matter. The instructor gives an explanation and review of diabetes co-morbidities, pre- and post-prandial blood glucose goals, pre-exercise blood glucose goals, signs, symptoms, and treatment of hypoglycemia and hyperglycemia, and foot care basics.   Diabetes Blitz:  -Group instruction provided by PowerPoint slides, verbal discussion, and written materials to support subject matter. The instructor gives an explanation and review of the physiology behind type 1 and type 2 diabetes, diabetes medications and rational behind using different medications, pre- and post-prandial blood glucose recommendations and Hemoglobin A1c goals, diabetes diet, and exercise  including blood glucose guidelines for exercising safely.    Portion Distortion:  -Group instruction provided by PowerPoint slides, verbal discussion, written materials, and food models to support subject matter. The instructor gives an explanation of serving size versus portion size, changes in portions sizes over the last 20 years, and what consists of a serving from each food group.   Stress Management:  -Group instruction provided by verbal instruction, video, and written materials to support subject matter.  Instructors review role of stress in heart disease and how to cope with stress positively.     Exercising on Your Own:  -Group instruction provided by verbal instruction, power point, and written materials to support subject.  Instructors discuss benefits of exercise, components of exercise, frequency and intensity of exercise, and end points for exercise.  Also discuss use of nitroglycerin and activating EMS.  Review options of places to exercise outside of rehab.  Review guidelines for sex with heart disease.   Cardiac Drugs I:  -Group instruction provided by verbal instruction and written materials to support subject.  Instructor reviews cardiac drug classes: antiplatelets, anticoagulants, beta blockers, and statins.  Instructor discusses reasons, side effects, and lifestyle considerations for each drug class.   Cardiac Drugs II:  -Group instruction provided by verbal instruction and written materials to support subject.  Instructor reviews cardiac drug classes: angiotensin converting enzyme inhibitors (ACE-I), angiotensin II receptor blockers (ARBs), nitrates, and calcium channel blockers.  Instructor discusses reasons, side effects, and lifestyle considerations for each drug class.   Anatomy and Physiology of the Circulatory System:  Group verbal and written instruction and models provide basic cardiac anatomy and physiology, with the coronary electrical and arterial systems.  Review of: AMI, Angina, Valve disease, Heart Failure, Peripheral Artery Disease, Cardiac Arrhythmia, Pacemakers, and the ICD.   CARDIAC REHAB PHASE II EXERCISE from 03/01/2018 in [place]  Date  03/01/18  Educator  RN  Instruction Review Code  2- Demonstrated Understanding      Other Education:  -Group or individual verbal, written, or video instructions that support the educational goals of the cardiac rehab program.   Holiday Eating Survival Tips:  -Group instruction provided by PowerPoint slides, verbal discussion, and written materials to support  subject matter. The instructor gives patients tips, tricks, and techniques to help them not only survive but enjoy the holidays despite the onslaught of food that accompanies the holidays.   Knowledge Questionnaire Score: Knowledge Questionnaire Score - 02/16/18 1014      Knowledge Questionnaire Score   Pre Score  18/24       Core Components/Risk Factors/Patient Goals at Admission: Personal Goals and Risk Factors at Admission - 02/16/18 1010      Core Components/Risk Factors/Patient Goals on Admission    Weight Management  Yes;Weight Loss    Intervention  Weight Management: Develop a combined nutrition and exercise program designed to reach desired caloric intake, while maintaining appropriate intake of nutrient and fiber, sodium and fats, and appropriate energy expenditure required for the weight goal.;Weight Management: Provide education and appropriate resources to help participant work on and attain dietary goals.;Weight Management/Obesity: Establish reasonable short term and long term weight goals.;Obesity: Provide education and appropriate resources to help participant work on and attain dietary goals.    Admit Weight  141 lb 1.5 oz (64 kg)    Goal Weight: Short Term  135 lb (61.2 kg)    Goal Weight: Long Term  131 lb (59.4 kg)    Expected Outcomes  Short Term: Continue to assess and modify  interventions until short term weight is achieved;Long Term: Adherence to nutrition and physical activity/exercise program aimed toward attainment of established weight goal;Weight Loss: Understanding of general recommendations for a balanced deficit meal plan, which promotes 1-2 lb weight loss per week and includes a negative energy balance of [phone] kcal/d;Understanding recommendations for meals to include 15-35% energy as protein, 25-35% energy from fat, 35-60% energy from carbohydrates, less than 200mg  of dietary cholesterol, 20-35 gm of total fiber daily;Understanding of distribution of calorie intake throughout the day with the consumption of 4-5 meals/snacks    Hypertension  Yes    Intervention  Provide education on lifestyle modifcations including regular physical activity/exercise, weight management, moderate sodium restriction and increased consumption of fresh fruit, vegetables, and low fat dairy, alcohol moderation, and smoking cessation.;Monitor prescription use compliance.    Expected Outcomes  Short Term: Continued assessment and intervention until BP is < 140/26mm HG in hypertensive participants. < 130/73mm HG in hypertensive participants with diabetes, heart failure or chronic kidney disease.;Long Term: Maintenance of blood pressure at goal levels.    Lipids  Yes    Intervention  Provide education and support for participant on nutrition & aerobic/resistive exercise along with prescribed medications to achieve LDL 70mg , HDL >40mg .    Expected Outcomes  Short Term: Participant states understanding of desired cholesterol values and is compliant with medications prescribed. Participant is following exercise prescription and nutrition guidelines.;Long Term: Cholesterol controlled with medications as prescribed, with individualized exercise RX and with personalized nutrition plan. Value goals: LDL < 70mg , HDL > 40 mg.    Stress  Yes    Intervention  Offer individual and/or small group  education and counseling on adjustment to heart disease, stress management and health-related lifestyle change. Teach and support self-help strategies.;Refer participants experiencing significant psychosocial distress to appropriate mental health specialists for further evaluation and treatment. When possible, include family members and significant others in education/counseling sessions.    Expected Outcomes  Short Term: Participant demonstrates changes in health-related behavior, relaxation and other stress management skills, ability to obtain effective social support, and compliance with psychotropic medications if prescribed.;Long Term: Emotional wellbeing is indicated by absence of clinically significant psychosocial distress or social isolation.  Core Components/Risk Factors/Patient Goals Review:  Goals and Risk Factor Review    Row Name 02/22/18 3007 02/22/18 1018 03/06/18 0838 03/31/18 1434       Core Components/Risk Factors/Patient Goals Review   Personal Goals Review  Weight Management/Obesity;Hypertension;Stress;Lipids  (Pended)   Weight Management/Obesity;Hypertension;Stress;Lipids  Weight Management/Obesity;Hypertension;Stress;Lipids  Weight Management/Obesity;Hypertension;Stress;Lipids    Review  -  Pt with multiple CAD RFs willing to participate in CR Program. Talayah would like to get back to volunteering at her church.  Pt with multiple CAD RFs willing to participate in CR Program. Jazia is tolerating exercise well with her recent start in the program.  Pt with multiple CAD RFs willing to participate in CR Program. Palma is tolerating exercise.  She feels that she is increasing her strength.     Expected Outcomes  -  Pt will continue to participate in CR exercise, nutrition, and lifestlye opportunities.   Pt will continue to participate in CR exercise, nutrition, and lifestlye opportunities.   Pt will continue to participate in CR exercise, nutrition, and lifestlye opportunities.         Core Components/Risk Factors/Patient Goals at Discharge (Final Review):  Goals and Risk Factor Review - 03/31/18 1434      Core Components/Risk Factors/Patient Goals Review   Personal Goals Review  Weight Management/Obesity;Hypertension;Stress;Lipids    Review  Pt with multiple CAD RFs willing to participate in CR Program. Mikeila is tolerating exercise.  She feels that she is increasing her strength.     Expected Outcomes  Pt will continue to participate in CR exercise, nutrition, and lifestlye opportunities.        ITP Comments: ITP Comments    Row Name 02/16/18 0817 02/22/18 0920 03/06/18 0834 03/31/18 1433     ITP Comments  Dr. Fransico Him, Medical Director  [facility] started exercise today and tolerated it well.   30 Day ITP Review.  Fianna has started CR recently and is tolerating it well. VSS.  30 Day ITP Review. Carisma is continuing to do well in CR.  She has great attendance.        Comments: See ITP Comments.

## 2018-04-07 ENCOUNTER — Encounter (HOSPITAL_COMMUNITY)
Admission: RE | Admit: 2018-04-07 | Discharge: 2018-04-07 | Disposition: A | Discharge status: Home / Self Care | Payer: Medicare Other | Source: Ambulatory Visit | Attending: Cardiology | Admitting: Cardiology

## 2018-04-07 ENCOUNTER — Encounter (HOSPITAL_COMMUNITY): Payer: Medicare Other

## 2018-04-07 DIAGNOSIS — Z9889 Other specified postprocedural states: Secondary | ICD-10-CM | POA: Diagnosis not present

## 2018-04-10 ENCOUNTER — Encounter (HOSPITAL_COMMUNITY)
Admission: RE | Admit: 2018-04-10 | Discharge: 2018-04-10 | Disposition: A | Discharge status: Home / Self Care | Payer: Medicare Other | Source: Ambulatory Visit | Attending: Cardiology | Admitting: Cardiology

## 2018-04-10 ENCOUNTER — Encounter (HOSPITAL_COMMUNITY): Payer: Medicare Other

## 2018-04-10 DIAGNOSIS — Z9889 Other specified postprocedural states: Secondary | ICD-10-CM | POA: Diagnosis not present

## 2018-04-11 ENCOUNTER — Other Ambulatory Visit: Payer: Self-pay

## 2018-04-11 NOTE — Patient Outreach (Signed)
[facility]) Care Management  04/11/2018  Katie [name] 1946-07-08 [id number]    1st outreach attempt to the patient for assessment. Patient answered the phone and stated that she is with her son at the doctors.  She asked if I could call her back at a later time.  Plan: RN Health Coach will make another outreach attempt to the patient within thirty days.  Lazaro Arms RN, BSN, [place] Direct Dial:  [phone]  Fax: [phone]

## 2018-04-11 NOTE — Patient Outreach (Signed)
[facility]) Care Management  04/11/2018   Katie [name] [birth year] [id number]  Subjective: Received call from the patient for assessment. HIPAA verified. The patient states that she has been doing fine.  She denies any chest pain, swelling and shortness of breath.  She states that she is taking her medication as prescribed.   She states that she is following a low salt diet. The patient states that she had not weighed today but she usually stays between 141-142 lbs.  I encouraged the patient to weigh daily.  She verbalized understanding. She exercises three days a week on Monday-Wednesday and Friday.  She reports that she has an appointment to see her Cardiologist on Thursday the 12 th.    Current Medications:  Current Outpatient Medications  Medication Sig Dispense Refill  . acetaminophen (TYLENOL) 500 MG tablet Take 1,000 mg by mouth every 6 (six) hours as needed for headache (pain).    Marland Kitchen aspirin 81 MG tablet Take 1 tablet (81 mg total) daily by mouth.    . butalbital-acetaminophen-caffeine (FIORICET, ESGIC) 50-325-40 MG tablet Take 1 tablet by mouth every 12 (twelve) hours as needed for headache. 14 tablet 0  . carvedilol (COREG) 6.25 MG tablet Take 0.5 tablets (3.125 mg total) by mouth 2 (two) times daily. 180 tablet 0  . clopidogrel (PLAVIX) 75 MG tablet Take 1 tablet (75 mg total) by mouth daily. 30 tablet 1  . feeding supplement, ENSURE ENLIVE, (ENSURE ENLIVE) LIQD Take 237 mLs by mouth 2 (two) times daily between meals. 237 mL 0  . furosemide (LASIX) 80 MG tablet Take 1 tablet (80 mg total) by mouth 2 (two) times daily. 60 tablet 0  . Hypromellose (ARTIFICIAL TEARS OP) Place 1 drop into both eyes daily as needed (dry eyes).    Marland Kitchen levothyroxine (SYNTHROID, LEVOTHROID) 25 MCG tablet Take 25 mcg by mouth daily before breakfast.    . potassium chloride SA (K-DUR,KLOR-CON) 20 MEQ tablet Take 1 tablet (20 mEq total) by mouth 2 (two) times daily. 60 tablet 0  . pravastatin  (PRAVACHOL) 80 MG tablet Take 1 tablet (80 mg total) by mouth at bedtime. 30 tablet 11  . amoxicillin (AMOXIL) 500 MG tablet Take 4 tablets (2,000 mg) 1 hour prior to dental procedures. (Patient not taking: Reported on 04/11/2018) 8 tablet 6   No current facility-administered medications for this visit.     Functional Status:  In your present state of health, do you have any difficulty performing the following activities: 03/07/2018 12/12/2017  Hearing? N N  Vision? N N  Difficulty concentrating or making decisions? N N  Walking or climbing stairs? Y Y  Dressing or bathing? N N  Doing errands, shopping? N Y  Conservation officer, nature and eating ? - N  Using the Toilet? - N  In the past six months, have you accidently leaked urine? - N  Do you have problems with loss of bowel control? - N  Managing your Medications? - N  Managing your Finances? - N  Housekeeping or managing your Housekeeping? - N  Some recent data might be hidden    Fall/Depression Screening: Fall Risk  04/11/2018 03/07/2018 02/06/2018  Falls in the past year? No No No  Number falls in past yr: - - -  Injury with Fall? - - -  Risk for fall due to : - - -  Follow up - - -   [facility] 2/9 Scores 03/07/2018 02/22/2018 12/12/2017 07/28/2017 03/03/2017 02/22/2017  PHQ - 2 Score 0  0 0 0 0 0    Assessment: Patient will continue to benefit from health coach outreach for disease management and support.  THN CM Care Plan Problem One     Most Recent Value  Care Plan Problem One  knowledge deficit related to CHF  THN Long Term Goal   in 90 days the patient will improve her health and verbalize no impatient admissions  THN Long Term Goal Start Date  04/11/18  Interventions for Problem One Long Term Goal  Discussed diet, signs and sypmtoms of CHF, medication managment and encouraged the patient to weigh daily.     Plan: RN Health Coach will contact patient in the month of December and patient agrees to next outreach.  Lazaro Arms RN, BSN, [place] Direct Dial:  [phone]  Fax: [phone]

## 2018-04-12 ENCOUNTER — Encounter (HOSPITAL_COMMUNITY)
Admission: RE | Admit: 2018-04-12 | Discharge: 2018-04-12 | Disposition: A | Discharge status: Home / Self Care | Payer: Medicare Other | Source: Ambulatory Visit | Attending: Cardiology | Admitting: Cardiology

## 2018-04-12 ENCOUNTER — Encounter (HOSPITAL_COMMUNITY): Payer: Medicare Other

## 2018-04-12 DIAGNOSIS — Z9889 Other specified postprocedural states: Secondary | ICD-10-CM | POA: Diagnosis not present

## 2018-04-12 LAB — CUP PACEART REMOTE DEVICE CHECK
Date Time Interrogation Session: 20190731021106
Implantable Pulse Generator Implant Date: 20180720

## 2018-04-13 ENCOUNTER — Ambulatory Visit (HOSPITAL_COMMUNITY)
Admission: RE | Admit: 2018-04-13 | Discharge: 2018-04-13 | Disposition: A | Discharge status: Home / Self Care | Payer: Medicare Other | Source: Ambulatory Visit | Attending: Cardiology | Admitting: Cardiology

## 2018-04-13 ENCOUNTER — Encounter (HOSPITAL_COMMUNITY): Payer: Self-pay | Admitting: Cardiology

## 2018-04-13 VITALS — BP 102/68 | HR 78 | Wt 141.2 lb

## 2018-04-13 DIAGNOSIS — I43 Cardiomyopathy in diseases classified elsewhere: Secondary | ICD-10-CM

## 2018-04-13 DIAGNOSIS — N183 Chronic kidney disease, stage 3 (moderate): Secondary | ICD-10-CM | POA: Diagnosis not present

## 2018-04-13 DIAGNOSIS — E785 Hyperlipidemia, unspecified: Secondary | ICD-10-CM | POA: Diagnosis not present

## 2018-04-13 DIAGNOSIS — Z8673 Personal history of transient ischemic attack (TIA), and cerebral infarction without residual deficits: Secondary | ICD-10-CM | POA: Diagnosis not present

## 2018-04-13 DIAGNOSIS — Z79899 Other long term (current) drug therapy: Secondary | ICD-10-CM | POA: Insufficient documentation

## 2018-04-13 DIAGNOSIS — Z79890 Hormone replacement therapy (postmenopausal): Secondary | ICD-10-CM | POA: Insufficient documentation

## 2018-04-13 DIAGNOSIS — Z85528 Personal history of other malignant neoplasm of kidney: Secondary | ICD-10-CM | POA: Insufficient documentation

## 2018-04-13 DIAGNOSIS — E8589 Other amyloidosis: Secondary | ICD-10-CM | POA: Insufficient documentation

## 2018-04-13 DIAGNOSIS — Z9889 Other specified postprocedural states: Secondary | ICD-10-CM

## 2018-04-13 DIAGNOSIS — Z905 Acquired absence of kidney: Secondary | ICD-10-CM | POA: Insufficient documentation

## 2018-04-13 DIAGNOSIS — G629 Polyneuropathy, unspecified: Secondary | ICD-10-CM | POA: Insufficient documentation

## 2018-04-13 DIAGNOSIS — E854 Organ-limited amyloidosis: Secondary | ICD-10-CM

## 2018-04-13 DIAGNOSIS — Z7982 Long term (current) use of aspirin: Secondary | ICD-10-CM | POA: Diagnosis not present

## 2018-04-13 DIAGNOSIS — I340 Nonrheumatic mitral (valve) insufficiency: Secondary | ICD-10-CM | POA: Insufficient documentation

## 2018-04-13 DIAGNOSIS — E039 Hypothyroidism, unspecified: Secondary | ICD-10-CM | POA: Insufficient documentation

## 2018-04-13 DIAGNOSIS — I5032 Chronic diastolic (congestive) heart failure: Secondary | ICD-10-CM | POA: Diagnosis not present

## 2018-04-13 DIAGNOSIS — I130 Hypertensive heart and chronic kidney disease with heart failure and stage 1 through stage 4 chronic kidney disease, or unspecified chronic kidney disease: Secondary | ICD-10-CM | POA: Diagnosis not present

## 2018-04-13 DIAGNOSIS — Z7902 Long term (current) use of antithrombotics/antiplatelets: Secondary | ICD-10-CM | POA: Diagnosis not present

## 2018-04-13 MED ORDER — FUROSEMIDE 80 MG PO TABS: ORAL_TABLET | ORAL | 0 refills | Status: DC

## 2018-04-13 NOTE — Progress Notes (Signed)
PCP: Dr. [name] HF Cardiology: Dr. [name]  72 yo with history of chronic diastolic CHF, mitral regurgitation, CVA, and CKD returns for followup of CHF.   Patient was initially admitted with diastolic CHF in 9/23. Echo showed EF 60-65% with grade 3 diastolic dysfunction and moderate MR.  She had cath showing no coronary disease. 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  TEE this admission showed moderate-severe MR.  She was admitted again in 10/18 with acute on chronic diastolic CHF. Echo this admission showed EF 50-55%, severe LVH, restrictive diastolic dysfunction, severe MR, moderate-severe TR.  She was referred to Dr. [name] who saw her recently for mitral valve evaluation.  Upon review of studies, regurgitation thought to be more in the moderate range and likely functional.   Patient had PYP amyloid scan in 12/18, this was suggestive of transthyretin amyloidosis.  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   Admission for A/C diastolic HF 3/0-0/76/2263. Required IV diuresis, then transitioned to 80 mg PO lasix BID. Metop was DC'd due to low blood pressures. Cardiac MRI strongly suggestive of amyloidosis, moderate to severe MR (moderate by regurgitant fraction 41% but severe visually). Echo repeated: EF 40-45%, moderate to severe LVH, severe MR, severe dilation of LA and RA, moderate TR, PASP 49.  She had Mitraclip x 2 placed in 5/19.  Repeat echo post-mitraclip showed EF 55-60%, moderate LVH, mean gradient 3 mmHg across the MV with trace-mild MR.   Cardiomems was placed 6/19.    She returns today for followup of CHF.  PADP is 9 by Cardiomems. She continues to have neuropathy pain in her legs.  Weight is stable.  She has lightheadedness with standing.  BP is relatively soft today.  She still fatigues easily but does not have significant dyspnea. She has started cardiac rehab. No chest pain, no orthopnea/PND.    ECG (personally reviewed):  NSR, low voltage  Labs (10/18): K 4.4, creatinine 1.27, LDL 56, HDL 39 Labs (11/18): K 3.9, creatinine 1.43, SPEP negative Labs (2/19): K 3.8, creatinine 1.40, hgb 13.4 Labs (5/19): K 4.1, creatinine 1.4, hgb 10.8 Labs (6/19): K 4.2, creatinine 1.4, hgb 12.4 Labs (7/19): K 4, creatinine 1.38  PMH: 1. HTN 2. Hyperlipidemia 3. 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  LINQ monitor placed.  4. Chronic diastolic CHF: She has Cardiomems.  - LHC (6/17): Normal coronaries.  - Echo (10/18): EF 50-55%, severe LVH with speckled myocardium concerning for amyloidosis, restrictive diastolic function with septal flattening, severe MR, moderate-severe TR, severe biatrial enlargement, PASP 54 mmHg.  - PYP amyloid scan: Grade 3, suggestive of transthyretin amyloidosis.  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   - Echo (5/19): EF 55-60%, moderate LVH c/w amyloidosis, Mitraclip x 2 with mean MV gradient 3 mmHg, trace-mild MR, moderate TR, PASP 62 mmHg.  - Echo (6/19): EF 55-60%, moderate LVH with speckled myocardium, trivial MR s/p Mitraclip with mean gradient 2 mmHg, PASP 61.  5. Mitral regurgitation: Think mixed functional/degenerative - TEE (7/18): EF 45-50%, moderate-severe MR, mild-moderate TR.  - Echo (10/18) read as severe MR.  -  2/19 echo with severe MR.  - 2/19 cardiac MRI with moderate to severe MR.  - Mitraclip x 2 in 5/19.   - Echo 5/19 post-Mitraclip with mean MV gradient 3 mmHg, trace-mild MR.  6. CKD stage 3 7. Hypothyroidism 8. H/o renal cell CA s/p nephrectomy.  9. Bilateral  carpal tunnel releases.   Social History   Socioeconomic History  . Marital status: Divorced    Spouse name: Not on file  . Number of children: 5  . Years of education: 54  . Highest education level: Not on file  Occupational History  . Occupation: retired    Fish farm manager: [employer]  . Financial resource strain: Not on file  . Food insecurity:    Worry: Not on file    Inability: Not on file  . Transportation needs:    Medical: Not on file    Non-medical: Not on file  Tobacco Use  . Smoking status: Never Smoker  . Smokeless tobacco: Never Used  Substance and Sexual Activity  . Alcohol use: No  . Drug use: No  . Sexual activity: Never  Lifestyle  . Physical activity:    Days per week: Not on file    Minutes per session: Not on file  . Stress: Not on file  Relationships  . Social connections:    Talks on phone: Not on file    Gets together: Not on file    Attends religious service: Not on file    Active member of club or organization: Not on file    Attends meetings of clubs or organizations: Not on file    Relationship status: Not on file  . Intimate partner violence:    Fear of current or ex partner: Not on file    Emotionally abused: Not on file    Physically abused: Not on file    Forced sexual activity: Not on file  Other Topics Concern  . Not on file  Social History Narrative   Lives alone in a one story home.  Her son stays with her some.  Has 5 children, 10 grandchildren and 10 great grandchildren.  Retired Training and development officer at [employer].  Education: high school.     Family History  Problem Relation Age of Onset  . Stroke Brother   . Diabetes Mellitus II Brother   . Diabetes Mellitus II Sister   . Breast cancer Sister   . Colon cancer Sister    ROS: All systems reviewed and negative except as per HPI.   Current Outpatient Medications  Medication Sig Dispense Refill  . acetaminophen (TYLENOL) 500 MG tablet Take 1,000 mg by mouth every 6  (six) hours as needed for headache (pain).    Marland Kitchen aspirin 81 MG tablet Take 1 tablet (81 mg total) daily by mouth.    . butalbital-acetaminophen-caffeine (FIORICET, ESGIC) 50-325-40 MG tablet Take 1 tablet by mouth every 12 (twelve) hours as needed for headache. 14 tablet 0  . carvedilol (COREG) 6.25 MG tablet Take 0.5 tablets (3.125 mg total) by mouth 2 (two) times daily. 180 tablet 0  . clopidogrel (PLAVIX) 75 MG tablet Take 1 tablet (75 mg total) by mouth daily. 30 tablet 1  . feeding supplement, ENSURE ENLIVE, (ENSURE ENLIVE) LIQD Take 237 mLs by mouth 2 (two) times daily between meals. 237 mL 0  . furosemide (LASIX) 80 MG tablet Take 1 tab in AM and 1/2 tab in PM 60 tablet 0  . Hypromellose (ARTIFICIAL TEARS OP) Place 1  drop into both eyes daily as needed (dry eyes).    Marland Kitchen levothyroxine (SYNTHROID, LEVOTHROID) 25 MCG tablet Take 25 mcg by mouth daily before breakfast.    . potassium chloride SA (K-DUR,KLOR-CON) 20 MEQ tablet Take 1 tablet (20 mEq total) by mouth 2 (two) times daily. 60 tablet 0  . pravastatin (PRAVACHOL) 80 MG tablet Take 1 tablet (80 mg total) by mouth at bedtime. 30 tablet 11  . amoxicillin (AMOXIL) 500 MG tablet Take 4 tablets (2,000 mg) 1 hour prior to dental procedures. (Patient not taking: Reported on 04/11/2018) 8 tablet 6   No current facility-administered medications for this encounter.    BP 102/68   Pulse 78   Wt 64 kg (141 lb 3.2 oz)   LMP 02/12/2013   SpO2 98%   BMI 25.41 kg/m  General: NAD Neck: No JVD, no thyromegaly or thyroid nodule.  Lungs: Clear to auscultation bilaterally with normal respiratory effort. CV: Nondisplaced PMI.  Heart regular S1/S2, no S3/S4, no murmur.  No peripheral edema.  No carotid bruit.  Normal pedal pulses.  Abdomen: Soft, nontender, no hepatosplenomegaly, no distention.  Skin: Intact without lesions or rashes.  Neurologic: Alert and oriented x 3.  Psych: Normal affect. Extremities: No clubbing or cyanosis.  HEENT: Normal.    Assessment/Plan: 1. Chronic diastolic CHF: Normal EF on 10/18 echo with severe LVH and speckled myocardium consistent with cardiac amyloidosis.  SPEP was negative. 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 Cardiac MRI also strongly suggestive of cardiac amyloidosis.  On exam today and by Cardiomems evaluation, she is not volume overloaded. She is overall improved, NYHA class II.    - With lightheadedness, she can decrease Lasix to 80 qam/40 qpm.  - Continue carvedilol 3.125 mg BID. 2. Mitral regurgitation: Mitral regurgitation seems to fluctuate somewhat based on loading conditions.  Most recently, echo in 2/19 suggested severe MR and cardiac MRI suggested moderate to severe MR (moderate by 41% regurgitant fraction but visually severe). I suspect that the etiology is mixed - functional as well as degenerative with thickening and restriction of posterior leaflet. She is now s/p Mitraclip procedure in 5/19. Post-clip echo in 6/19 showed mean gradient 2 mmHg across the MV with trace MR.  3. CKD: Stage III.  BMET today.  4. CVA: 7/18. Has LINQ monitor, no atrial fibrillation detected so far.  - Continue ASA 81.   - Continue Plavix. - Continue statin, lipids ok 09/2017 5. 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  6. Cardiac amyloidosis: Hereditary TTR amyloidosis.  She has peripheral neuropathy as well as bilateral carpal tunnel syndrome.   - Tafamidis has been approved for her, we will give her a prescription today.   Followup in 3 months.   Loralie Champagne,  04/13/2018 10:51 PM

## 2018-04-13 NOTE — Patient Instructions (Signed)
Decrease Furosemide to 80 mg (1 tab) in AM and 40 mg (1/2 tab) in PM  Lab today  Your physician recommends that you schedule a follow-up appointment in: 3 months

## 2018-04-14 ENCOUNTER — Encounter (HOSPITAL_COMMUNITY): Payer: Medicare Other

## 2018-04-14 ENCOUNTER — Encounter (HOSPITAL_COMMUNITY)
Admission: RE | Admit: 2018-04-14 | Discharge: 2018-04-14 | Disposition: A | Discharge status: Home / Self Care | Payer: Medicare Other | Source: Ambulatory Visit | Attending: Cardiology | Admitting: Cardiology

## 2018-04-14 DIAGNOSIS — Z9889 Other specified postprocedural states: Secondary | ICD-10-CM

## 2018-04-16 LAB — CUP PACEART REMOTE DEVICE CHECK
Date Time Interrogation Session: 20190902021119
Implantable Pulse Generator Implant Date: 20180720

## 2018-04-17 ENCOUNTER — Encounter (HOSPITAL_COMMUNITY): Payer: Medicare Other

## 2018-04-17 ENCOUNTER — Ambulatory Visit (HOSPITAL_COMMUNITY)
Admission: RE | Admit: 2018-04-17 | Discharge: 2018-04-17 | Disposition: A | Discharge status: Home / Self Care | Payer: Medicare Other | Source: Ambulatory Visit | Attending: Cardiology | Admitting: Cardiology

## 2018-04-17 ENCOUNTER — Encounter (HOSPITAL_COMMUNITY)
Admission: RE | Admit: 2018-04-17 | Discharge: 2018-04-17 | Disposition: A | Discharge status: Home / Self Care | Payer: Medicare Other | Source: Ambulatory Visit | Attending: Cardiology | Admitting: Cardiology

## 2018-04-17 ENCOUNTER — Other Ambulatory Visit (HOSPITAL_COMMUNITY): Payer: Self-pay | Admitting: *Deleted

## 2018-04-17 VITALS — BP 120/68 | HR 57

## 2018-04-17 DIAGNOSIS — I5032 Chronic diastolic (congestive) heart failure: Secondary | ICD-10-CM | POA: Diagnosis not present

## 2018-04-17 DIAGNOSIS — R001 Bradycardia, unspecified: Secondary | ICD-10-CM | POA: Insufficient documentation

## 2018-04-17 DIAGNOSIS — Z9889 Other specified postprocedural states: Secondary | ICD-10-CM

## 2018-04-17 DIAGNOSIS — R9431 Abnormal electrocardiogram [ECG] [EKG]: Secondary | ICD-10-CM | POA: Diagnosis not present

## 2018-04-17 LAB — BASIC METABOLIC PANEL
Anion gap: 9 (ref 5–15)
BUN: 18 mg/dL (ref 8–23)
CO2: 27 mmol/L (ref 22–32)
Calcium: 9.5 mg/dL (ref 8.9–10.3)
Chloride: 105 mmol/L (ref 98–111)
Creatinine, Ser: 1.42 mg/dL — ABNORMAL HIGH (ref 0.44–1.00)
GFR calc Af Amer: 42 mL/min — ABNORMAL LOW (ref >60)
GFR calc non Af Amer: 36 mL/min — ABNORMAL LOW (ref >60)
Glucose, Bld: 90 mg/dL (ref 70–99)
Potassium: 4.7 mmol/L (ref 3.5–5.1)
Sodium: 141 mmol/L (ref 135–145)

## 2018-04-17 NOTE — Progress Notes (Signed)
Pt in for labs today and stated she had chest pain over the weekend.  She states on Fri she did not feel well and rested most of the day.  On Saturday she started having the chest pain and it was off/on.  She states she was going to come to ER but then started feeling better.  She states she she has felt ok since then, has had the pain occ off/on since.  Will check VS and get EKG while pt is here.

## 2018-04-17 NOTE — Progress Notes (Signed)
Pt described the CP as "tightness" denied any SOB.  VS stable, EKG with no changes.  Cardiomems was at 17 on Sat which is high for pt, yesterday it was down to 11 and it has not been done for today yet.  Discussed all above w/Dr [name].  He feels chest pain/tightess was probably due to fluid as cardiomems was elevated and cath is 2017 showed widely patent cors.  He wants pt to increase Lasix to 80 mg BID for 2 days.  Pt is aware, agreeable and verbalizes understanding

## 2018-04-17 NOTE — Patient Instructions (Signed)
Increase Lasix (furosemide) to 80 mg Twice daily FOR 2 DAYS ONLY, then go back to 80 mg in AM and 40 mg (1/2 tab) in PM  Please call and let us know if you have anymore chest tightness  Please keep follow up appt as scheduled

## 2018-04-17 NOTE — Progress Notes (Signed)
Incomplete Session Note  Patient Details  Name: Katie [name] MRN: [id number] Date of Birth: 1945-09-08 Referring Provider:     CARDIAC REHAB PHASE II ORIENTATION from 02/16/2018 in [place]  Referring Provider  Larey Dresser MD       Katie [name] did not complete her rehab session.  Katie [name] reported CP off and on during the weekend (9/14 & 9/15). The pain intensified to 9/10 at times. She did not go to ED.  She has an appointment this morning with CHF clinic as soon as it opens for blood work and was planning to inform the clinic. Phone call made to clinic to update. Spoke with Nira Conn on triage line.

## 2018-04-19 ENCOUNTER — Encounter (HOSPITAL_COMMUNITY)
Admission: RE | Admit: 2018-04-19 | Discharge: 2018-04-19 | Disposition: A | Discharge status: Home / Self Care | Payer: Medicare Other | Source: Ambulatory Visit | Attending: Cardiology | Admitting: Cardiology

## 2018-04-19 ENCOUNTER — Encounter (HOSPITAL_COMMUNITY): Payer: Medicare Other

## 2018-04-19 DIAGNOSIS — Z9889 Other specified postprocedural states: Secondary | ICD-10-CM | POA: Diagnosis not present

## 2018-04-21 ENCOUNTER — Ambulatory Visit (INDEPENDENT_AMBULATORY_CARE_PROVIDER_SITE_OTHER): Payer: Medicare Other | Admitting: Neurology

## 2018-04-21 ENCOUNTER — Encounter (HOSPITAL_COMMUNITY)
Admission: RE | Admit: 2018-04-21 | Discharge: 2018-04-21 | Disposition: A | Discharge status: Home / Self Care | Payer: Medicare Other | Source: Ambulatory Visit | Attending: Cardiology | Admitting: Cardiology

## 2018-04-21 ENCOUNTER — Encounter: Payer: Self-pay | Admitting: Neurology

## 2018-04-21 ENCOUNTER — Encounter (HOSPITAL_COMMUNITY): Payer: Medicare Other

## 2018-04-21 VITALS — BP 110/70 | HR 72 | Ht 63.0 in | Wt 141.2 lb

## 2018-04-21 DIAGNOSIS — E854 Organ-limited amyloidosis: Secondary | ICD-10-CM

## 2018-04-21 DIAGNOSIS — G990 Autonomic neuropathy in diseases classified elsewhere: Secondary | ICD-10-CM | POA: Diagnosis not present

## 2018-04-21 DIAGNOSIS — I639 Cerebral infarction, unspecified: Secondary | ICD-10-CM | POA: Diagnosis not present

## 2018-04-21 DIAGNOSIS — Z9889 Other specified postprocedural states: Secondary | ICD-10-CM | POA: Diagnosis not present

## 2018-04-21 DIAGNOSIS — D472 Monoclonal gammopathy: Secondary | ICD-10-CM

## 2018-04-21 NOTE — Progress Notes (Signed)
Follow-up Visit   Date: 04/21/18    Katie [name] MRN: [id number] DOB: 08-12-1945   Interim History: Katie [name] is a 72 y.o. 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  The patient was accompanied to the clinic by self.  History of present illness: She has history of heart failure since 2017 and underwent PYP amyloid scan in December suggestive of transthyretin amyloidosis.  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  She is being evaluated for patisiran treatment.  From a neurological standpoint, patient has had history of bilateral carpal tunnel release and continues to have hand tingling.  She has some weakness related to arthritis, but denies dropping objects. Over the past year, she has intermittent tingling of both feet.  She denies imbalance and has not suffered any falls.  She walks unassisted.  She manages her own household duties such as cooking, cleaning, and driving. Her son who is an amputee lives with her.  There is no family history of neuropathy.  Of note, she had a left cerebellar stroke in July 2018 secondary to embolic basilar occlusion and absent flow in bilateral SCA and PCAs.  She was treated with high-dose statin and dual antiplatelet therapy. Subsequent MRA from March 2019 shows no residual stenosis.  UPDATE 04/21/2018:  She is here for routine follow-up visit for neuropathy.  She continues to have paresthesias of hands, painful or bothersome.  She was recently approved for tafamidis, but pharmacy does not have it available to pick up.  She denies any new neurological symptoms, falls, hospitalizations.  Over the past week, she has been  having some increased shortness of breath and chest discomfort.  She contacted her cardiologist's office and was told to make some changes to her Lasix.  She does have some lightheadedness, this is more positional.  No dry eyes or dry mouth.  She does not have any new complaints today.  Medications:  Current Outpatient Medications on File Prior to Visit  Medication Sig Dispense Refill  . acetaminophen (TYLENOL) 500 MG tablet Take 1,000 mg by mouth every 6 (six) hours as needed for headache (pain).    Marland Kitchen amoxicillin (AMOXIL) 500 MG tablet Take 4 tablets (2,000 mg) 1 hour prior to dental procedures. 8 tablet 6  . aspirin 81 MG tablet Take 1 tablet (81 mg total) daily by mouth.    . butalbital-acetaminophen-caffeine (FIORICET, ESGIC) 50-325-40 MG tablet Take 1 tablet by mouth every 12 (twelve) hours as needed for headache. 14 tablet 0  . carvedilol (COREG) 6.25 MG tablet Take 0.5 tablets (3.125 mg total) by mouth 2 (two) times daily. 180 tablet 0  . clopidogrel (PLAVIX) 75 MG tablet Take 1 tablet (75 mg total) by mouth daily. 30 tablet 1  . feeding supplement, ENSURE ENLIVE, (ENSURE ENLIVE) LIQD Take 237 mLs by mouth 2 (two) times daily between meals. 237 mL 0  . furosemide (LASIX) 80 MG tablet Take 1 tab in AM and 1/2 tab in PM 60 tablet 0  . Hypromellose (ARTIFICIAL TEARS OP) Place 1 drop into both eyes daily as needed (dry eyes).    Marland Kitchen levothyroxine (SYNTHROID, LEVOTHROID) 25 MCG tablet Take 25 mcg by mouth daily before breakfast.    . potassium chloride SA (K-DUR,KLOR-CON) 20 MEQ tablet  Take 1 tablet (20 mEq total) by mouth 2 (two) times daily. 60 tablet 0  . pravastatin (PRAVACHOL) 80 MG tablet Take 1 tablet (80 mg total) by mouth at bedtime. 30 tablet 11   No current facility-administered medications on file prior to visit.     Allergies:  Allergies  Allergen Reactions  . Tramadol Shortness Of Breath and Palpitations    Review of Systems:  CONSTITUTIONAL: No fevers, chills, night sweats,  or weight loss.  EYES: No visual changes or eye pain ENT: No hearing changes.  No history of nose bleeds.   RESPIRATORY: No cough, wheezing +shortness of breath.   CARDIOVASCULAR: +chest pain, and palpitations.   GI: Negative for abdominal discomfort, blood in stools or black stools.  No recent change in bowel habits.   GU:  No history of incontinence.   MUSCLOSKELETAL: No history of joint pain +swelling.  No myalgias.   SKIN: Negative for lesions, rash, and itching.   ENDOCRINE: Negative for cold or heat intolerance, polydipsia or goiter.   PSYCH:  No depression or anxiety symptoms.   NEURO: As Above.   Vital Signs:  BP 110/70   Pulse 72   Ht 5\' 3"  (1.6 m)   Wt 141 lb 4 oz (64.1 kg)   LMP 02/12/2013   SpO2 99%   BMI 25.02 kg/m    General Medical Exam:   General:  Well appearing, comfortable  Eyes/ENT: see cranial nerve examination.   Neck: No masses appreciated.   No carotid bruits. Respiratory:  Mildly reduced breath sounds at the bases, no rales or wheezes.   Cardiac:  Regular rate and rhythm, no murmur.   Ext:  Trace edema in the ankles  Neurological Exam: MENTAL STATUS including orientation to time, place, person, recent and remote memory, attention span and concentration, language, and fund of knowledge is normal.  Speech is not dysarthric.  CRANIAL NERVES: Pupils equal round and reactive to light.  Normal conjugate, extra-ocular eye movements in all directions of gaze.  No ptosis. Normal facial sensation.  Face is symmetric. Palate elevates symmetrically.  Tongue is midline.  MOTOR:  Motor strength is 5/5 in all extremities, except 5-/5 bilateral interosseus muscles.  No atrophy, fasciculations or abnormal movements.  No pronator drift.  Tone is normal.    MSRs:  Reflexes are 2+/4 throughout, except 1+/4 bilateral Achilles reflex.  SENSORY: Vibration is mildly reduced at the great toe bilaterally.  Sensation is intact at the MCP bilaterally.  COORDINATION/GAIT:     Gait narrow based and stable.   Data: MRA head 10/14/2017:  Negative intracranial MRA. No residual or recurrent distal basilar Stenosis.  Cardiac SPECT 07/06/2017:  Visual and quantitative assessment (grade 3, H/CLL equal 1.94) are strongly suggestive of transthyretin amyloidosis.  MRI/A head 04/22/2017:    Chronic ischemic changes as above. No acute infarct.  No significant intracranial stenosis on MRA. Previously noted embolic occlusion distal basilar extending into the posterior cerebral artery bilaterally has resolved.  NCS/EMG of the right side 12/15/2017: The electrophysiologic findings are most consistent with a sensorimotor polyneuropathy, demyelinating with secondary axon loss, affecting the right upper extremity; moderate in degree electrically.    Alternatively, right carpal tunnel and cubital tunnel syndrome cannot be excluded. Correlate clinically.  IMPRESSION/PLAN: 1.  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 Subjectively and by exam, she has evidence of has  neuropathy in the feet.  There has been no progression of symptoms based on exam.  Overall, her gait and distal strength is intact. Paresthesias are mild and not bothersome to want to take medications.  Gait is unassisted and stable. If pain gets worse, plan to start low dose gabapentin. She is followed by Dr. [name] for cardiac issues and was recently prescribed tafamidis, however pharmacy does not have the medication so she has not started taking it yet. Hopefully, she will have some stability of neuropathy symptoms with tafamidis.   2.  History of left cerebellar embolic stroke (6433) secondary to basilar occlusion, no residual deficits.    Return to clinic in 6 months or sooner as needed    Thank you for allowing me to participate in patient's  care.  If I can answer any additional questions, I would be pleased to do so.    Sincerely,    Jahni Nazar K. Posey Pronto, DO

## 2018-04-21 NOTE — Patient Instructions (Signed)
Return to clinic in 6 months.

## 2018-04-24 ENCOUNTER — Encounter (HOSPITAL_COMMUNITY)
Admission: RE | Admit: 2018-04-24 | Discharge: 2018-04-24 | Disposition: A | Discharge status: Home / Self Care | Payer: Medicare Other | Source: Ambulatory Visit | Attending: Cardiology | Admitting: Cardiology

## 2018-04-24 ENCOUNTER — Encounter (HOSPITAL_COMMUNITY): Payer: Medicare Other

## 2018-04-24 DIAGNOSIS — Z9889 Other specified postprocedural states: Secondary | ICD-10-CM | POA: Diagnosis not present

## 2018-04-26 ENCOUNTER — Encounter (HOSPITAL_COMMUNITY): Payer: Medicare Other

## 2018-04-26 ENCOUNTER — Encounter (HOSPITAL_COMMUNITY)
Admission: RE | Admit: 2018-04-26 | Discharge: 2018-04-26 | Disposition: A | Discharge status: Home / Self Care | Payer: Medicare Other | Source: Ambulatory Visit | Attending: Cardiology | Admitting: Cardiology

## 2018-04-26 DIAGNOSIS — Z9889 Other specified postprocedural states: Secondary | ICD-10-CM

## 2018-04-28 ENCOUNTER — Encounter (HOSPITAL_COMMUNITY): Payer: Medicare Other

## 2018-04-28 ENCOUNTER — Ambulatory Visit: Payer: Self-pay

## 2018-04-28 ENCOUNTER — Ambulatory Visit
Admission: RE | Admit: 2018-04-28 | Discharge: 2018-04-28 | Disposition: A | Discharge status: Home / Self Care | Payer: Medicare Other | Source: Ambulatory Visit | Attending: Internal Medicine | Admitting: Internal Medicine

## 2018-04-28 ENCOUNTER — Other Ambulatory Visit: Payer: Self-pay | Admitting: Internal Medicine

## 2018-04-28 DIAGNOSIS — M2550 Pain in unspecified joint: Secondary | ICD-10-CM

## 2018-04-28 DIAGNOSIS — E041 Nontoxic single thyroid nodule: Secondary | ICD-10-CM

## 2018-05-01 ENCOUNTER — Encounter (HOSPITAL_COMMUNITY): Payer: Medicare Other

## 2018-05-01 ENCOUNTER — Encounter (HOSPITAL_COMMUNITY)
Admission: RE | Admit: 2018-05-01 | Discharge: 2018-05-01 | Disposition: A | Discharge status: Home / Self Care | Payer: Medicare Other | Source: Ambulatory Visit | Attending: Cardiology | Admitting: Cardiology

## 2018-05-01 ENCOUNTER — Encounter (HOSPITAL_COMMUNITY): Payer: Self-pay

## 2018-05-01 DIAGNOSIS — Z9889 Other specified postprocedural states: Secondary | ICD-10-CM | POA: Diagnosis not present

## 2018-05-03 ENCOUNTER — Encounter (HOSPITAL_COMMUNITY)
Admission: RE | Admit: 2018-05-03 | Discharge: 2018-05-03 | Disposition: A | Discharge status: Home / Self Care | Payer: Medicare Other | Source: Ambulatory Visit | Attending: Cardiology | Admitting: Cardiology

## 2018-05-03 ENCOUNTER — Encounter (HOSPITAL_COMMUNITY): Payer: Medicare Other

## 2018-05-03 DIAGNOSIS — Z7902 Long term (current) use of antithrombotics/antiplatelets: Secondary | ICD-10-CM | POA: Insufficient documentation

## 2018-05-03 DIAGNOSIS — Z79890 Hormone replacement therapy (postmenopausal): Secondary | ICD-10-CM | POA: Insufficient documentation

## 2018-05-03 DIAGNOSIS — Z9889 Other specified postprocedural states: Secondary | ICD-10-CM | POA: Insufficient documentation

## 2018-05-03 DIAGNOSIS — E7800 Pure hypercholesterolemia, unspecified: Secondary | ICD-10-CM | POA: Insufficient documentation

## 2018-05-03 DIAGNOSIS — Z79899 Other long term (current) drug therapy: Secondary | ICD-10-CM | POA: Diagnosis not present

## 2018-05-03 DIAGNOSIS — N183 Chronic kidney disease, stage 3 (moderate): Secondary | ICD-10-CM | POA: Insufficient documentation

## 2018-05-03 DIAGNOSIS — I130 Hypertensive heart and chronic kidney disease with heart failure and stage 1 through stage 4 chronic kidney disease, or unspecified chronic kidney disease: Secondary | ICD-10-CM | POA: Insufficient documentation

## 2018-05-03 DIAGNOSIS — Z95818 Presence of other cardiac implants and grafts: Secondary | ICD-10-CM | POA: Diagnosis not present

## 2018-05-03 DIAGNOSIS — Z7982 Long term (current) use of aspirin: Secondary | ICD-10-CM | POA: Diagnosis not present

## 2018-05-03 DIAGNOSIS — E039 Hypothyroidism, unspecified: Secondary | ICD-10-CM | POA: Insufficient documentation

## 2018-05-03 DIAGNOSIS — I5042 Chronic combined systolic (congestive) and diastolic (congestive) heart failure: Secondary | ICD-10-CM | POA: Insufficient documentation

## 2018-05-04 ENCOUNTER — Ambulatory Visit
Admission: RE | Admit: 2018-05-04 | Discharge: 2018-05-04 | Disposition: A | Discharge status: Home / Self Care | Payer: Medicare Other | Source: Ambulatory Visit | Attending: Internal Medicine | Admitting: Internal Medicine

## 2018-05-04 DIAGNOSIS — E041 Nontoxic single thyroid nodule: Secondary | ICD-10-CM

## 2018-05-04 NOTE — Progress Notes (Signed)
Cardiac Individual Treatment Plan  Patient Details  Name: Katie [name] MRN: [id number] Date of Birth: [birth year] Referring Provider:     CARDIAC REHAB PHASE II ORIENTATION from 02/16/2018 in [facility]  Referring Provider  Larey Dresser MD       Initial Encounter Date:    CARDIAC REHAB PHASE II ORIENTATION from 02/16/2018 in [facility]  Date  02/16/18      Visit Diagnosis: 11/30/17 S/P MVR (mitral valve repair)  Patient's Home Medications on Admission:  Current Outpatient Medications:  .  acetaminophen (TYLENOL) 500 MG tablet, Take 1,000 mg by mouth every 6 (six) hours as needed for headache (pain)., Disp: , Rfl:  .  amoxicillin (AMOXIL) 500 MG tablet, Take 4 tablets (2,000 mg) 1 hour prior to dental procedures., Disp: 8 tablet, Rfl: 6 .  aspirin 81 MG tablet, Take 1 tablet (81 mg total) daily by mouth., Disp: , Rfl:  .  butalbital-acetaminophen-caffeine (FIORICET, ESGIC) 50-325-40 MG tablet, Take 1 tablet by mouth every 12 (twelve) hours as needed for headache., Disp: 14 tablet, Rfl: 0 .  carvedilol (COREG) 6.25 MG tablet, Take 0.5 tablets (3.125 mg total) by mouth 2 (two) times daily., Disp: 180 tablet, Rfl: 0 .  clopidogrel (PLAVIX) 75 MG tablet, Take 1 tablet (75 mg total) by mouth daily., Disp: 30 tablet, Rfl: 1 .  feeding supplement, ENSURE ENLIVE, (ENSURE ENLIVE) LIQD, Take 237 mLs by mouth 2 (two) times daily between meals., Disp: 237 mL, Rfl: 0 .  furosemide (LASIX) 80 MG tablet, Take 1 tab in AM and 1/2 tab in PM, Disp: 60 tablet, Rfl: 0 .  Hypromellose (ARTIFICIAL TEARS OP), Place 1 drop into both eyes daily as needed (dry eyes)., Disp: , Rfl:  .  levothyroxine (SYNTHROID, LEVOTHROID) 25 MCG tablet, Take 25 mcg by mouth daily before breakfast., Disp: , Rfl:  .  potassium chloride SA (K-DUR,KLOR-CON) 20 MEQ tablet, Take 1 tablet (20 mEq total) by mouth 2 (two) times daily., Disp: 60 tablet, Rfl: 0 .  pravastatin  (PRAVACHOL) 80 MG tablet, Take 1 tablet (80 mg total) by mouth at bedtime., Disp: 30 tablet, Rfl: 11  Past Medical History: Past Medical History:  Diagnosis Date  . 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 Cancer [facility])    kidney cancer  . Chronic combined systolic and diastolic CHF (congestive heart failure) ([place])   . 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 Dyspnea   . Headache   . History of kidney cancer    a. s/p right nephrectomy ~2007.  Marland Kitchen History of loop recorder   . Hypercholesterolemia   . Hypertension   . Hypothyroidism   . Mitral regurgitation   . Pulmonary hypertension ([place])   . Stroke ([place])    no residual    Tobacco Use: Social History   Tobacco Use  Smoking Status Never Smoker  Smokeless Tobacco Never Used    Labs: Recent Review Flowsheet Data    Labs for ITP Cardiac and Pulmonary Rehab Latest Ref Rng & Units 02/10/2017 05/09/2017 09/08/2017 11/25/2017 01/05/2018   Cholestrol 0 - 200 mg/dL 159 105 126 - -   LDLCALC 0 - 99 mg/dL 88 56 69 - -   HDL >40 mg/dL 58 39(L) 52 - -   Trlycerides <150 mg/dL 66 48 24 - -   Hemoglobin A1c 4.8 - 5.6 % 5.6 5.2 - 5.5 -  PHART 7.350 - 7.450 - - - 7.441 -   PCO2ART 32.0 - 48.0 mmHg - - - 42.3 -   HCO3 20.0 - 28.0 mmol/L - - - 28.3(H) 28.8(H)   TCO2 22 - 32 mmol/L - - - - 30   O2SAT % - - - 98.4 71.0      Capillary Blood Glucose: Lab Results  Component Value Date   GLUCAP 84 01/08/2016   GLUCAP 80 01/08/2016     Exercise Target Goals: Exercise Program Goal: Individual exercise prescription set using results from initial 6 min walk test and THRR while considering  patient's activity barriers and safety.   Exercise Prescription Goal: Initial exercise prescription builds to 30-45 minutes a day of aerobic activity, 2-3 days per week.  Home exercise guidelines will be given to patient during program as part of exercise prescription that the participant will  acknowledge.  Activity Barriers & Risk Stratification: Activity Barriers & Cardiac Risk Stratification - 02/16/18 0821      Activity Barriers & Cardiac Risk Stratification   Activity Barriers  Joint Problems;Deconditioning;Muscular Weakness;Back Problems;Arthritis;Other (comment)    Comments  carpal tunnel syndrome in hands    Cardiac Risk Stratification  High       6 Minute Walk: 6 Minute Walk    Row Name 02/16/18 1013         6 Minute Walk   Phase  Initial     Distance  1052 feet     Walk Time  6 minutes     # of Rest Breaks  0     MPH  1.99     METS  2.01     RPE  12     Perceived Dyspnea   0     VO2 Peak  7.03     Symptoms  No     Resting HR  69 bpm     Resting BP  100/54     Resting Oxygen Saturation   98 %     Exercise Oxygen Saturation  during 6 min walk  97 %     Max Ex. HR  78 bpm     Max Ex. BP  104/60     2 Minute Post BP  104/70        Oxygen Initial Assessment:   Oxygen Re-Evaluation:   Oxygen Discharge (Final Oxygen Re-Evaluation):   Initial Exercise Prescription: Initial Exercise Prescription - 02/16/18 1000      Date of Initial Exercise RX and Referring Provider   Date  02/16/18    Referring Provider  Larey Dresser MD     Expected Discharge Date  05/26/18      NuStep   Level  2    SPM  75    Minutes  10    METs  2      Arm Ergometer   Level  1.5    Watts  20    Minutes  10    METs  2.61      Track   Laps  9    Minutes  10    METs  2.53      Prescription Details   Frequency (times per week)  3x    Duration  Progress to 30 minutes of continuous aerobic without signs/symptoms of physical distress      Intensity   THRR 40-80% of Max Heartrate  59-118    Ratings of Perceived Exertion  11-13    Perceived Dyspnea  0-4  Progression   Progression  Continue progressive overload as per policy without signs/symptoms or physical distress.      Resistance Training   Training Prescription  Yes    Weight  2lbs    Reps   10-15       Perform Capillary Blood Glucose checks as needed.  Exercise Prescription Changes: Exercise Prescription Changes    Row Name 02/22/18 0900 03/06/18 0810 03/22/18 1510 03/31/18 1448 04/19/18 0900     Response to Exercise   Blood Pressure (Admit)  110/62  112/80  126/70  110/70  102/78   Blood Pressure (Exercise)  120/80  120/70  110/68  118/70  112/72   Blood Pressure (Exit)  114/80  102/70  104/60  98/62  114/68   Heart Rate (Admit)  85 bpm  80 bpm  94 bpm  88 bpm  79 bpm   Heart Rate (Exercise)  93 bpm  106 bpm  116 bpm  114 bpm  90 bpm   Heart Rate (Exit)  66 bpm  82 bpm  75 bpm  82 bpm  74 bpm   Rating of Perceived Exertion (Exercise)  13  9  11  11  12    Perceived Dyspnea (Exercise)  0  0  0  0  0   Symptoms  None   None   None   None   None    Comments  Pt oriented to exercise equipment   None   None   -  None    Duration  Progress to 30 minutes of  aerobic without signs/symptoms of physical distress  Continue with 30 min of aerobic exercise without signs/symptoms of physical distress.  Continue with 30 min of aerobic exercise without signs/symptoms of physical distress.  Continue with 30 min of aerobic exercise without signs/symptoms of physical distress.  Continue with 30 min of aerobic exercise without signs/symptoms of physical distress.   Intensity  THRR New  THRR unchanged  THRR unchanged  THRR unchanged  THRR unchanged     Progression   Progression  Continue to progress workloads to maintain intensity without signs/symptoms of physical distress.  Continue to progress workloads to maintain intensity without signs/symptoms of physical distress.  Continue to progress workloads to maintain intensity without signs/symptoms of physical distress.  Continue to progress workloads to maintain intensity without signs/symptoms of physical distress.  Continue to progress workloads to maintain intensity without signs/symptoms of physical distress.   Average METs  2.44  2.53  2.7   3.13  3.01     Resistance Training   Training Prescription  No  Yes  No  Yes  No   Weight  -  2lbs  -  3lbs  -   Reps  -  10-15  -  10-15  -   Time  -  10 Minutes  -  10 Minutes  -     Interval Training   Interval Training  No  No  No  No  No     NuStep   Level  3  3  3  3  3    SPM  85  85  80  85  95   Minutes  10  10  10  10  10    METs  2.8  2.4  2.2  3  2.4     Arm Ergometer   Level  1.5  2  2  2  2    Watts  20  20  25  42  26   Minutes  10  10  10  10  10    METs  2.64  2.6  3  3.45  3.05     Track   Laps  5  9  11  12  12    Minutes  10  10  10  10  10    METs  2  2.53  2.91  3.07  3.07     Home Exercise Plan   Plans to continue exercise at  -  -  Home (comment) Walking  Home (comment) Walking  Home (comment) Walking   Frequency  -  -  Add 2 additional days to program exercise sessions.  Add 2 additional days to program exercise sessions.  Add 2 additional days to program exercise sessions.   Initial Home Exercises Provided  -  -  03/17/18  03/17/18  03/17/18   Row Name 05/03/18 1600             Response to Exercise   Blood Pressure (Admit)  128/68       Blood Pressure (Exercise)  126/68       Blood Pressure (Exit)  108/68       Heart Rate (Admit)  87 bpm       Heart Rate (Exercise)  109 bpm       Heart Rate (Exit)  78 bpm       Rating of Perceived Exertion (Exercise)  11       Perceived Dyspnea (Exercise)  0       Symptoms  None       Duration  Continue with 30 min of aerobic exercise without signs/symptoms of physical distress.       Intensity  THRR unchanged         Progression   Progression  Continue to progress workloads to maintain intensity without signs/symptoms of physical distress.       Average METs  2.36         Resistance Training   Training Prescription  No         Interval Training   Interval Training  No         NuStep   Level  3       SPM  95       Minutes  10       METs  2.6         Arm Ergometer   Level  2       Minutes  10        METs  1.4         Track   Laps  12       Minutes  10       METs  3.07         Home Exercise Plan   Plans to continue exercise at  Home (comment) Walking       Frequency  Add 2 additional days to program exercise sessions.       Initial Home Exercises Provided  03/17/18          Exercise Comments: Exercise Comments    Row Name 02/22/18 5366 03/08/18 0809 03/22/18 1045 04/24/18 1644     Exercise Comments  Pt's first day of exercise. Pt responded well to exercise prescription. Will continue to monitor and progress pt as tolerated.   Pt is continuing to respond well to exercise prescription. Pt is able to exercise 30 minutes with no difficulty.  Will continue to montior and folllow up with pt regarding home exercies plans.   Reviewed HEP with pt. Pt is currently walking at home. Will continue to follow up with pt regarding exercise at home. Will continue to monitor and progress pt.   Reviewed METs and Goals with pt. Pt is continuing to respond well to exercise workloads. Still working with pt to report all symptoms to rehab staff and to not keep those important details to herself before, during or after exercise.        Exercise Goals and Review: Exercise Goals    Row Name 02/16/18 5366             Exercise Goals   Increase Physical Activity  Yes       Intervention  Provide advice, education, support and counseling about physical activity/exercise needs.;Develop an individualized exercise prescription for aerobic and resistive training based on initial evaluation findings, risk stratification, comorbidities and participant's personal goals.       Expected Outcomes  Short Term: Attend rehab on a regular basis to increase amount of physical activity.;Long Term: Add in home exercise to make exercise part of routine and to increase amount of physical activity.;Long Term: Exercising regularly at least 3-5 days a week.       Increase Strength and Stamina  Yes       Intervention  Provide  advice, education, support and counseling about physical activity/exercise needs.;Develop an individualized exercise prescription for aerobic and resistive training based on initial evaluation findings, risk stratification, comorbidities and participant's personal goals.       Expected Outcomes  Short Term: Increase workloads from initial exercise prescription for resistance, speed, and METs.;Short Term: Perform resistance training exercises routinely during rehab and add in resistance training at home;Long Term: Improve cardiorespiratory fitness, muscular endurance and strength as measured by increased METs and functional capacity (6MWT)       Able to understand and use rate of perceived exertion (RPE) scale  Yes       Intervention  Provide education and explanation on how to use RPE scale       Expected Outcomes  Short Term: Able to use RPE daily in rehab to express subjective intensity level;Long Term:  Able to use RPE to guide intensity level when exercising independently       Knowledge and understanding of Target Heart Rate Range (THRR)  Yes       Intervention  Provide education and explanation of THRR including how the numbers were predicted and where they are located for reference       Expected Outcomes  Short Term: Able to state/look up THRR;Long Term: Able to use THRR to govern intensity when exercising independently;Short Term: Able to use daily as guideline for intensity in rehab       Able to check pulse independently  Yes       Intervention  Provide education and demonstration on how to check pulse in carotid and radial arteries.;Review the importance of being able to check your own pulse for safety during independent exercise       Expected Outcomes  Short Term: Able to explain why pulse checking is important during independent exercise;Long Term: Able to check pulse independently and accurately       Understanding of Exercise Prescription  Yes       Intervention  Provide education,  explanation, and written materials on patient's individual exercise prescription       Expected Outcomes  Short Term: Able  to explain program exercise prescription;Long Term: Able to explain home exercise prescription to exercise independently          Exercise Goals Re-Evaluation : Exercise Goals Re-Evaluation    Row Name 03/22/18 1046 04/24/18 1646           Exercise Goal Re-Evaluation   Exercise Goals Review  Increase Physical Activity;Able to understand and use rate of perceived exertion (RPE) scale;Knowledge and understanding of Target Heart Rate Range (THRR);Understanding of Exercise Prescription;Improve claudication pain tolerance and improve walking ability;Able to check pulse independently;Increase Strength and Stamina  Increase Physical Activity;Understanding of Exercise Prescription      Comments  Reviewed HEP with pt. Also reviewed THRR, RPE Scale, weather precautions, endpoints of exercise, NTG use, warmup and cool down.   Pt has been struggling with headaches and blurry vision. Pt had appointment with Neurology. Pt has habit of not reporting symptoms when she is not feeling well. Stressed the importance of telling staff when she is feeling symtomatic before, during or after exercise.       Expected Outcomes  Pt will continue to walk 2-3 days a week for 20-30 minutes. Pt will continue to increase cardiorespiratory fitness. Will continue to monitor.   Pt will continue to walk 2-3 days a week for 20-30 minutes. Pt will continue to increase stamina and strength. Will continue to monitor.          Discharge Exercise Prescription (Final Exercise Prescription Changes): Exercise Prescription Changes - 05/03/18 1600      Response to Exercise   Blood Pressure (Admit)  128/68    Blood Pressure (Exercise)  126/68    Blood Pressure (Exit)  108/68    Heart Rate (Admit)  87 bpm    Heart Rate (Exercise)  109 bpm    Heart Rate (Exit)  78 bpm    Rating of Perceived Exertion (Exercise)  11     Perceived Dyspnea (Exercise)  0    Symptoms  None    Duration  Continue with 30 min of aerobic exercise without signs/symptoms of physical distress.    Intensity  THRR unchanged      Progression   Progression  Continue to progress workloads to maintain intensity without signs/symptoms of physical distress.    Average METs  2.36      Resistance Training   Training Prescription  No      Interval Training   Interval Training  No      NuStep   Level  3    SPM  95    Minutes  10    METs  2.6      Arm Ergometer   Level  2    Minutes  10    METs  1.4      Track   Laps  12    Minutes  10    METs  3.07      Home Exercise Plan   Plans to continue exercise at  Home (comment)   Walking   Frequency  Add 2 additional days to program exercise sessions.    Initial Home Exercises Provided  03/17/18       Nutrition:  Target Goals: Understanding of nutrition guidelines, daily intake of sodium 1500mg , cholesterol 200mg , calories 30% from fat and 7% or less from saturated fats, daily to have 5 or more servings of fruits and vegetables.  Biometrics: Pre Biometrics - 02/16/18 1014      Pre Biometrics   Height  5' 2.5" (1.588  m)    Weight  64 kg    Waist Circumference  33.75 inches    Hip Circumference  39.75 inches    Waist to Hip Ratio  0.85 %    BMI (Calculated)  25.38    Triceps Skinfold  36 mm    % Body Fat  39.3 %    Grip Strength  28 kg    Flexibility  12.5 in    Single Leg Stand  30 seconds        Nutrition Therapy Plan and Nutrition Goals: Nutrition Therapy & Goals - 02/16/18 1214      Nutrition Therapy   Diet  consistent carbohydrate heart healthy      Personal Nutrition Goals   Nutrition Goal  Pt to identify and limit food sources of saturated fat, trans fat, and sodium    Personal Goal #2  Pt to identify and limit food sources of refined grains and simple sugars      Intervention Plan   Intervention  Prescribe, educate and counsel regarding  individualized specific dietary modifications aiming towards targeted core components such as weight, hypertension, lipid management, diabetes, heart failure and other comorbidities.    Expected Outcomes  Short Term Goal: Understand basic principles of dietary content, such as calories, fat, sodium, cholesterol and nutrients.       Nutrition Assessments: Nutrition Assessments - 02/16/18 1216      MEDFICTS Scores   Pre Score  29       Nutrition Goals Re-Evaluation: Nutrition Goals Re-Evaluation    Row Name 02/16/18 1215 02/16/18 1216           Goals   Current Weight  141 lb 1.5 oz (64 kg)  141 lb 1.5 oz (64 kg)      Nutrition Goal  Pt to identify and limit food sources of saturated fat, trans fat, and sodium  Pt to identify and limit food sources of saturated fat, trans fat, and sodium        Personal Goal #2 Re-Evaluation   Personal Goal #2  -  Pt to identify and limit food sources of refined grains and simple sugars         Nutrition Goals Re-Evaluation: Nutrition Goals Re-Evaluation    [place] Name 02/16/18 1215 02/16/18 1216           Goals   Current Weight  141 lb 1.5 oz (64 kg)  141 lb 1.5 oz (64 kg)      Nutrition Goal  Pt to identify and limit food sources of saturated fat, trans fat, and sodium  Pt to identify and limit food sources of saturated fat, trans fat, and sodium        Personal Goal #2 Re-Evaluation   Personal Goal #2  -  Pt to identify and limit food sources of refined grains and simple sugars         Nutrition Goals Discharge (Final Nutrition Goals Re-Evaluation): Nutrition Goals Re-Evaluation - 02/16/18 1216      Goals   Current Weight  141 lb 1.5 oz (64 kg)    Nutrition Goal  Pt to identify and limit food sources of saturated fat, trans fat, and sodium      Personal Goal #2 Re-Evaluation   Personal Goal #2  Pt to identify and limit food sources of refined grains and simple sugars       Psychosocial: Target Goals: Acknowledge presence or  absence of significant depression and/or stress, maximize coping skills, provide  positive support system. Participant is able to verbalize types and ability to use techniques and skills needed for reducing stress and depression.  Initial Review & Psychosocial Screening: Initial Psych Review & Screening - 02/16/18 1043      Initial Review   Current issues with  None Identified      Family Dynamics   Good Support System?  Yes   Katie [name] reports her family and friends as sources of support.      Barriers   Psychosocial barriers to participate in program  There are no identifiable barriers or psychosocial needs.      Screening Interventions   Interventions  Encouraged to exercise       Quality of Life Scores: Quality of Life - 02/16/18 1015      Quality of Life   Select  Quality of Life      Quality of Life Scores   Health/Function Pre  30 %    Socioeconomic Pre  27 %    Psych/Spiritual Pre  30 %    Family Pre  30 %    GLOBAL Pre  29.42 %      Scores of 19 and below usually indicate a poorer quality of life in these areas.  A difference of  2-3 points is a clinically meaningful difference.  A difference of 2-3 points in the total score of the Quality of Life Index has been associated with significant improvement in overall quality of life, self-image, physical symptoms, and general health in studies assessing change in quality of life.  PHQ-9: Recent Review Flowsheet Data    Depression screen [facility] 2/9 03/07/2018 02/22/2018 12/12/2017 07/28/2017 03/03/2017   Decreased Interest 0 0 0 0 0   Down, Depressed, Hopeless 0 0 0 0 0   PHQ - 2 Score 0 0 0 0 0     Interpretation of Total Score  Total Score Depression Severity:  1-4 = Minimal depression, 5-9 = Mild depression, 10-14 = Moderate depression, 15-19 = Moderately severe depression, 20-27 = Severe depression   Psychosocial Evaluation and Intervention: Psychosocial Evaluation - 02/22/18 1020      Psychosocial Evaluation &  Interventions   Interventions  Encouraged to exercise with the program and follow exercise prescription    Comments  No psychosocial needs identified. Katie [name] enjoys visiting her brother in assisted living and shopping.    Expected Outcomes  Katie [name] will maintain a positive outlook with good coping skills.    Continue Psychosocial Services   No Follow up required       Psychosocial Re-Evaluation: Psychosocial Re-Evaluation    [place] Name 03/06/18 1610 03/31/18 1433 05/01/18 1642         Psychosocial Re-Evaluation   Current issues with  None Identified  None Identified  None Identified     Comments  No psychosocial needs identified.   No psychosocial needs identified.   No psychosocial needs identified.      Expected Outcomes  Katie [name] will continue to have a positive outlook with good coping skills.  Katie [name] will continue to have a positive outlook with good coping skills.  Katie [name] will continue to have a positive outlook with good coping skills.     Interventions  Encouraged to attend Cardiac Rehabilitation for the exercise  Encouraged to attend Cardiac Rehabilitation for the exercise  Encouraged to attend Cardiac Rehabilitation for the exercise     Continue Psychosocial Services   No Follow up required  No Follow up required  No Follow up required  Psychosocial Discharge (Final Psychosocial Re-Evaluation): Psychosocial Re-Evaluation - 05/01/18 1642      Psychosocial Re-Evaluation   Current issues with  None Identified    Comments  No psychosocial needs identified.     Expected Outcomes  Katie [name] will continue to have a positive outlook with good coping skills.    Interventions  Encouraged to attend Cardiac Rehabilitation for the exercise    Continue Psychosocial Services   No Follow up required       Vocational Rehabilitation: Provide vocational rehab assistance to qualifying candidates.   Vocational Rehab Evaluation & Intervention: Vocational Rehab - 02/16/18 1043      Initial  Vocational Rehab Evaluation & Intervention   Assessment shows need for Vocational Rehabilitation  No       Education: Education Goals: Education classes will be provided on a weekly basis, covering required topics. Participant will state understanding/return demonstration of topics presented.  Learning Barriers/Preferences: Learning Barriers/Preferences - 02/16/18 0820      Learning Barriers/Preferences   Learning Barriers  Sight    Learning Preferences  Written Material       Education Topics: Count Your Pulse:  -Group instruction provided by verbal instruction, demonstration, patient participation and written materials to support subject.  Instructors address importance of being able to find your pulse and how to count your pulse when at home without a heart monitor.  Patients get hands on experience counting their pulse with staff help and individually.   Heart Attack, Angina, and Risk Factor Modification:  -Group instruction provided by verbal instruction, video, and written materials to support subject.  Instructors address signs and symptoms of angina and heart attacks.    Also discuss risk factors for heart disease and how to make changes to improve heart health risk factors.   Functional Fitness:  -Group instruction provided by verbal instruction, demonstration, patient participation, and written materials to support subject.  Instructors address safety measures for doing things around the house.  Discuss how to get up and down off the floor, how to pick things up properly, how to safely get out of a chair without assistance, and balance training.   Meditation and Mindfulness:  -Group instruction provided by verbal instruction, patient participation, and written materials to support subject.  Instructor addresses importance of mindfulness and meditation practice to help reduce stress and improve awareness.  Instructor also leads participants through a meditation exercise.     Stretching for Flexibility and Mobility:  -Group instruction provided by verbal instruction, patient participation, and written materials to support subject.  Instructors lead participants through series of stretches that are designed to increase flexibility thus improving mobility.  These stretches are additional exercise for major muscle groups that are typically performed during regular warm up and cool down.   Hands Only CPR:  -Group verbal, video, and participation provides a basic overview of AHA guidelines for community CPR. Role-play of emergencies allow participants the opportunity to practice calling for help and chest compression technique with discussion of AED use.   Hypertension: -Group verbal and written instruction that provides a basic overview of hypertension including the most recent diagnostic guidelines, risk factor reduction with self-care instructions and medication management.    Nutrition I class: Heart Healthy Eating:  -Group instruction provided by PowerPoint slides, verbal discussion, and written materials to support subject matter. The instructor gives an explanation and review of the Therapeutic Lifestyle Changes diet recommendations, which includes a discussion on lipid goals, dietary fat, sodium, fiber, plant stanol/sterol esters, sugar, and  the components of a well-balanced, healthy diet.   Nutrition II class: Lifestyle Skills:  -Group instruction provided by PowerPoint slides, verbal discussion, and written materials to support subject matter. The instructor gives an explanation and review of label reading, grocery shopping for heart health, heart healthy recipe modifications, and ways to make healthier choices when eating out.   Diabetes Question & Answer:  -Group instruction provided by PowerPoint slides, verbal discussion, and written materials to support subject matter. The instructor gives an explanation and review of diabetes co-morbidities, pre-  and post-prandial blood glucose goals, pre-exercise blood glucose goals, signs, symptoms, and treatment of hypoglycemia and hyperglycemia, and foot care basics.   Diabetes Blitz:  -Group instruction provided by PowerPoint slides, verbal discussion, and written materials to support subject matter. The instructor gives an explanation and review of the physiology behind type 1 and type 2 diabetes, diabetes medications and rational behind using different medications, pre- and post-prandial blood glucose recommendations and Hemoglobin A1c goals, diabetes diet, and exercise including blood glucose guidelines for exercising safely.    Portion Distortion:  -Group instruction provided by PowerPoint slides, verbal discussion, written materials, and food models to support subject matter. The instructor gives an explanation of serving size versus portion size, changes in portions sizes over the last 20 years, and what consists of a serving from each food group.   Stress Management:  -Group instruction provided by verbal instruction, video, and written materials to support subject matter.  Instructors review role of stress in heart disease and how to cope with stress positively.     Exercising on Your Own:  -Group instruction provided by verbal instruction, power point, and written materials to support subject.  Instructors discuss benefits of exercise, components of exercise, frequency and intensity of exercise, and end points for exercise.  Also discuss use of nitroglycerin and activating EMS.  Review options of places to exercise outside of rehab.  Review guidelines for sex with heart disease.   Cardiac Drugs I:  -Group instruction provided by verbal instruction and written materials to support subject.  Instructor reviews cardiac drug classes: antiplatelets, anticoagulants, beta blockers, and statins.  Instructor discusses reasons, side effects, and lifestyle considerations for each drug  class.   Cardiac Drugs II:  -Group instruction provided by verbal instruction and written materials to support subject.  Instructor reviews cardiac drug classes: angiotensin converting enzyme inhibitors (ACE-I), angiotensin II receptor blockers (ARBs), nitrates, and calcium channel blockers.  Instructor discusses reasons, side effects, and lifestyle considerations for each drug class.   Anatomy and Physiology of the Circulatory System:  Group verbal and written instruction and models provide basic cardiac anatomy and physiology, with the coronary electrical and arterial systems. Review of: AMI, Angina, Valve disease, Heart Failure, Peripheral Artery Disease, Cardiac Arrhythmia, Pacemakers, and the ICD.   CARDIAC REHAB PHASE II EXERCISE from 03/01/2018 in [place]  Date  03/01/18  Educator  RN  Instruction Review Code  2- Demonstrated Understanding      Other Education:  -Group or individual verbal, written, or video instructions that support the educational goals of the cardiac rehab program.   Holiday Eating Survival Tips:  -Group instruction provided by PowerPoint slides, verbal discussion, and written materials to support subject matter. The instructor gives patients tips, tricks, and techniques to help them not only survive but enjoy the holidays despite the onslaught of food that accompanies the holidays.   Knowledge Questionnaire Score: Knowledge Questionnaire Score - 02/16/18 1014  Knowledge Questionnaire Score   Pre Score  18/24       Core Components/Risk Factors/Patient Goals at Admission: Personal Goals and Risk Factors at Admission - 02/16/18 1010      Core Components/Risk Factors/Patient Goals on Admission    Weight Management  Yes;Weight Loss    Intervention  Weight Management: Develop a combined nutrition and exercise program designed to reach desired caloric intake, while maintaining appropriate intake of nutrient and fiber,  sodium and fats, and appropriate energy expenditure required for the weight goal.;Weight Management: Provide education and appropriate resources to help participant work on and attain dietary goals.;Weight Management/Obesity: Establish reasonable short term and long term weight goals.;Obesity: Provide education and appropriate resources to help participant work on and attain dietary goals.    Admit Weight  141 lb 1.5 oz (64 kg)    Goal Weight: Short Term  135 lb (61.2 kg)    Goal Weight: Long Term  131 lb (59.4 kg)    Expected Outcomes  Short Term: Continue to assess and modify interventions until short term weight is achieved;Long Term: Adherence to nutrition and physical activity/exercise program aimed toward attainment of established weight goal;Weight Loss: Understanding of general recommendations for a balanced deficit meal plan, which promotes 1-2 lb weight loss per week and includes a negative energy balance of [phone] kcal/d;Understanding recommendations for meals to include 15-35% energy as protein, 25-35% energy from fat, 35-60% energy from carbohydrates, less than 200mg  of dietary cholesterol, 20-35 gm of total fiber daily;Understanding of distribution of calorie intake throughout the day with the consumption of 4-5 meals/snacks    Hypertension  Yes    Intervention  Provide education on lifestyle modifcations including regular physical activity/exercise, weight management, moderate sodium restriction and increased consumption of fresh fruit, vegetables, and low fat dairy, alcohol moderation, and smoking cessation.;Monitor prescription use compliance.    Expected Outcomes  Short Term: Continued assessment and intervention until BP is < 140/67mm HG in hypertensive participants. < 130/46mm HG in hypertensive participants with diabetes, heart failure or chronic kidney disease.;Long Term: Maintenance of blood pressure at goal levels.    Lipids  Yes    Intervention  Provide education and support for  participant on nutrition & aerobic/resistive exercise along with prescribed medications to achieve LDL 70mg , HDL >40mg .    Expected Outcomes  Short Term: Participant states understanding of desired cholesterol values and is compliant with medications prescribed. Participant is following exercise prescription and nutrition guidelines.;Long Term: Cholesterol controlled with medications as prescribed, with individualized exercise RX and with personalized nutrition plan. Value goals: LDL < 70mg , HDL > 40 mg.    Stress  Yes    Intervention  Offer individual and/or small group education and counseling on adjustment to heart disease, stress management and health-related lifestyle change. Teach and support self-help strategies.;Refer participants experiencing significant psychosocial distress to appropriate mental health specialists for further evaluation and treatment. When possible, include family members and significant others in education/counseling sessions.    Expected Outcomes  Short Term: Participant demonstrates changes in health-related behavior, relaxation and other stress management skills, ability to obtain effective social support, and compliance with psychotropic medications if prescribed.;Long Term: Emotional wellbeing is indicated by absence of clinically significant psychosocial distress or social isolation.       Core Components/Risk Factors/Patient Goals Review:  Goals and Risk Factor Review    Row Name 02/22/18 9562 02/22/18 1018 03/06/18 0838 03/31/18 1434 05/01/18 1642     Core Components/Risk Factors/Patient Goals Review   Personal Goals  Review  Weight Management/Obesity;Hypertension;Stress;Lipids  (Pended)   Weight Management/Obesity;Hypertension;Stress;Lipids  Weight Management/Obesity;Hypertension;Stress;Lipids  Weight Management/Obesity;Hypertension;Stress;Lipids  Weight Management/Obesity;Hypertension;Stress;Lipids   Review  -  Pt with multiple CAD RFs willing to participate in CR  Program. Katie [name] would like to get back to volunteering at her church.  Pt with multiple CAD RFs willing to participate in CR Program. Katie [name] is tolerating exercise well with her recent start in the program.  Pt with multiple CAD RFs willing to participate in CR Program. Katie [name] is tolerating exercise.  She feels that she is increasing her strength.   Pt with multiple CAD RFs willing to participate in CR Program. Katie [name] is tolerating exercise.  She has reported CP over a weekend.  This was treated by her cardiologist. No symptoms now.    Expected Outcomes  -  Pt will continue to participate in CR exercise, nutrition, and lifestlye opportunities.   Pt will continue to participate in CR exercise, nutrition, and lifestlye opportunities.   Pt will continue to participate in CR exercise, nutrition, and lifestlye opportunities.   Pt will continue to participate in CR exercise, nutrition, and lifestlye opportunities.       Core Components/Risk Factors/Patient Goals at Discharge (Final Review):  Goals and Risk Factor Review - 05/01/18 1642      Core Components/Risk Factors/Patient Goals Review   Personal Goals Review  Weight Management/Obesity;Hypertension;Stress;Lipids    Review  Pt with multiple CAD RFs willing to participate in CR Program. Katie [name] is tolerating exercise.  She has reported CP over a weekend.  This was treated by her cardiologist. No symptoms now.     Expected Outcomes  Pt will continue to participate in CR exercise, nutrition, and lifestlye opportunities.        ITP Comments: ITP Comments    Row Name 02/16/18 0817 02/22/18 0920 03/06/18 0834 03/31/18 1433 05/01/18 1641   ITP Comments  Dr. Fransico Him, Medical Director  Katie [name] started exercise today and tolerated it well.   30 Day ITP Review.  Katie [name] has started CR recently and is tolerating it well. VSS.  30 Day ITP Review. Katie [name] is continuing to do well in CR.  She has great attendance.   30 Day ITP Review. Katie [name] is tolerating exercise well.   Vital signs are stable. Had one episdoe of chest discomfort treated by cardiologist.  No reported symptoms since.      Comments:  See ITP Comments.

## 2018-05-05 ENCOUNTER — Encounter (HOSPITAL_COMMUNITY)
Admission: RE | Admit: 2018-05-05 | Discharge: 2018-05-05 | Disposition: A | Discharge status: Home / Self Care | Payer: Medicare Other | Source: Ambulatory Visit | Attending: Cardiology | Admitting: Cardiology

## 2018-05-05 ENCOUNTER — Encounter (HOSPITAL_COMMUNITY): Payer: Medicare Other

## 2018-05-05 ENCOUNTER — Ambulatory Visit (INDEPENDENT_AMBULATORY_CARE_PROVIDER_SITE_OTHER): Payer: Medicare Other | Admitting: *Deleted

## 2018-05-05 DIAGNOSIS — Z9889 Other specified postprocedural states: Secondary | ICD-10-CM | POA: Diagnosis not present

## 2018-05-05 DIAGNOSIS — I639 Cerebral infarction, unspecified: Secondary | ICD-10-CM

## 2018-05-06 NOTE — Progress Notes (Signed)
Carelink Summary Report / Loop Recorder 

## 2018-05-07 LAB — CUP PACEART REMOTE DEVICE CHECK
Date Time Interrogation Session: 20191005024449
Implantable Pulse Generator Implant Date: 20180720

## 2018-05-08 ENCOUNTER — Encounter (HOSPITAL_COMMUNITY): Payer: Medicare Other

## 2018-05-08 ENCOUNTER — Encounter (HOSPITAL_COMMUNITY)
Admission: RE | Admit: 2018-05-08 | Discharge: 2018-05-08 | Disposition: A | Discharge status: Home / Self Care | Payer: Medicare Other | Source: Ambulatory Visit | Attending: Cardiology | Admitting: Cardiology

## 2018-05-08 VITALS — Ht 62.5 in | Wt 144.4 lb

## 2018-05-08 DIAGNOSIS — Z9889 Other specified postprocedural states: Secondary | ICD-10-CM | POA: Diagnosis not present

## 2018-05-10 ENCOUNTER — Encounter (HOSPITAL_COMMUNITY): Payer: Medicare Other

## 2018-05-10 ENCOUNTER — Encounter (HOSPITAL_COMMUNITY)
Admission: RE | Admit: 2018-05-10 | Discharge: 2018-05-10 | Disposition: A | Discharge status: Home / Self Care | Payer: Medicare Other | Source: Ambulatory Visit | Attending: Cardiology | Admitting: Cardiology

## 2018-05-10 DIAGNOSIS — Z9889 Other specified postprocedural states: Secondary | ICD-10-CM | POA: Diagnosis not present

## 2018-05-12 ENCOUNTER — Encounter (HOSPITAL_COMMUNITY)
Admission: RE | Admit: 2018-05-12 | Discharge: 2018-05-12 | Disposition: A | Discharge status: Home / Self Care | Payer: Medicare Other | Source: Ambulatory Visit | Attending: Cardiology | Admitting: Cardiology

## 2018-05-12 ENCOUNTER — Encounter (HOSPITAL_COMMUNITY): Payer: Medicare Other

## 2018-05-12 DIAGNOSIS — Z9889 Other specified postprocedural states: Secondary | ICD-10-CM | POA: Diagnosis not present

## 2018-05-15 ENCOUNTER — Telehealth (HOSPITAL_COMMUNITY): Payer: Self-pay | Admitting: Adult Health

## 2018-05-15 ENCOUNTER — Encounter (HOSPITAL_COMMUNITY)
Admission: RE | Admit: 2018-05-15 | Discharge: 2018-05-15 | Disposition: A | Discharge status: Home / Self Care | Payer: Medicare Other | Source: Ambulatory Visit | Attending: Cardiology | Admitting: Cardiology

## 2018-05-15 ENCOUNTER — Encounter (HOSPITAL_COMMUNITY): Payer: Medicare Other

## 2018-05-15 DIAGNOSIS — Z9889 Other specified postprocedural states: Secondary | ICD-10-CM

## 2018-05-15 NOTE — Telephone Encounter (Signed)
See above  Katie [name] 1:41 PM

## 2018-05-15 NOTE — Telephone Encounter (Signed)
   Cardiomems elevated today.   I called and spoke to Ms Weinheimer and she said that her PCP instructed her to cut back morning lasix to 40 mg and continue 40 mg in the evening.   I asked her to increased lasix back to 80 mg in the morning and continue 40 mg in the pm.    She verbalized understanding.   Plan to check cardiomems on Thursday.   Keeon Zurn NP-C  12:35 PM

## 2018-05-17 ENCOUNTER — Encounter (HOSPITAL_COMMUNITY): Payer: Medicare Other

## 2018-05-17 ENCOUNTER — Encounter (HOSPITAL_COMMUNITY)
Admission: RE | Admit: 2018-05-17 | Discharge: 2018-05-17 | Disposition: A | Discharge status: Home / Self Care | Payer: Medicare Other | Source: Ambulatory Visit | Attending: Cardiology | Admitting: Cardiology

## 2018-05-17 DIAGNOSIS — Z9889 Other specified postprocedural states: Secondary | ICD-10-CM

## 2018-05-19 ENCOUNTER — Encounter (HOSPITAL_COMMUNITY)
Admission: RE | Admit: 2018-05-19 | Discharge: 2018-05-19 | Disposition: A | Discharge status: Home / Self Care | Payer: Medicare Other | Source: Ambulatory Visit | Attending: Cardiology | Admitting: Cardiology

## 2018-05-19 ENCOUNTER — Encounter (HOSPITAL_COMMUNITY): Payer: Medicare Other

## 2018-05-19 DIAGNOSIS — Z9889 Other specified postprocedural states: Secondary | ICD-10-CM

## 2018-05-22 ENCOUNTER — Encounter (HOSPITAL_COMMUNITY): Payer: Medicare Other

## 2018-05-22 ENCOUNTER — Encounter (HOSPITAL_COMMUNITY)
Admission: RE | Admit: 2018-05-22 | Discharge: 2018-05-22 | Disposition: A | Discharge status: Home / Self Care | Payer: Medicare Other | Source: Ambulatory Visit | Attending: Cardiology | Admitting: Cardiology

## 2018-05-22 DIAGNOSIS — Z9889 Other specified postprocedural states: Secondary | ICD-10-CM

## 2018-05-24 ENCOUNTER — Encounter (HOSPITAL_COMMUNITY): Payer: Medicare Other

## 2018-05-26 ENCOUNTER — Encounter (HOSPITAL_COMMUNITY): Payer: Medicare Other

## 2018-06-01 NOTE — Progress Notes (Signed)
Discharge Progress Report  Patient Details  Name: Katie [name] MRN: [id number] Date of Birth: [birth year] Referring Provider:     [facility] from 02/16/2018 in [place]  Referring Provider  Larey Dresser MD        Number of Visits: 36  Reason for Discharge:  Patient reached a stable level of exercise. Patient independent in their exercise. Patient has met program and personal goals.  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)  ADL UCSD:   Initial Exercise Prescription: Initial Exercise Prescription - 02/16/18 1000      Date of Initial Exercise RX and Referring Provider   Date  02/16/18    Referring Provider  Larey Dresser MD     Expected Discharge Date  05/26/18      NuStep   Level  2    SPM  75    Minutes  10    METs  2      Arm Ergometer   Level  1.5    Watts  20    Minutes  10    METs  2.61      Track   Laps  9    Minutes  10    METs  2.53      Prescription Details   Frequency (times per week)  3x    Duration  Progress to 30 minutes of continuous aerobic without signs/symptoms of physical distress      Intensity   THRR 40-80% of Max Heartrate  59-118    Ratings of Perceived Exertion  11-13    Perceived Dyspnea  0-4      Progression   Progression  Continue progressive overload as per policy without signs/symptoms or physical distress.      Resistance Training   Training Prescription  Yes    Weight  2lbs    Reps  10-15       Discharge Exercise Prescription (Final Exercise Prescription Changes): Exercise Prescription Changes - 05/22/18 1300      Response to Exercise   Blood Pressure (Admit)  118/80    Blood Pressure (Exercise)  122/80    Blood Pressure (Exit)  102/68    Heart Rate (Admit)  73 bpm    Heart Rate (Exercise)  105 bpm    Heart Rate (Exit)  69 bpm     Rating of Perceived Exertion (Exercise)  11    Perceived Dyspnea (Exercise)  0    Symptoms  None    Comments  Pt graduated from Cardiac Rehab     Duration  Progress to 45 minutes of aerobic exercise without signs/symptoms of physical distress    Intensity  THRR unchanged      Progression   Progression  Continue to progress workloads to maintain intensity without signs/symptoms of physical distress.    Average METs  3.19      Resistance Training   Training Prescription  Yes    Weight  3lbs    Reps  10-15    Time  10 Minutes      Interval Training   Interval Training  No      NuStep   Level  4    SPM  95    Minutes  10    METs  2.4      Arm Ergometer  Level  2    Minutes  10    METs  2      Track   Laps  14    Minutes  10    METs  3.45      Home Exercise Plan   Plans to continue exercise at  Home (comment)   Walking   Frequency  Add 3 additional days to program exercise sessions.    Initial Home Exercises Provided  03/17/18       Functional Capacity: 6 Minute Walk    Row Name 02/16/18 1013 05/10/18 1519       6 Minute Walk   Phase  Initial  Discharge    Distance  1052 feet  1380 feet    Distance % Change  -  31.18 %    Distance Feet Change  -  328 ft    Walk Time  6 minutes  6 minutes    # of Rest Breaks  0  0    MPH  1.99  2.61    METS  2.01  3.12    RPE  12  11    Perceived Dyspnea   0  0    VO2 Peak  7.03  10.94    Symptoms  No  Yes (comment)    Comments  -  bilateral knee pain +5    Resting HR  69 bpm  97 bpm    Resting BP  100/54  120/70    Resting Oxygen Saturation   98 %  -    Exercise Oxygen Saturation  during 6 min walk  97 %  -    Max Ex. HR  78 bpm  130 bpm    Max Ex. BP  104/60  122/80    2 Minute Post BP  104/70  100/60       Psychological, QOL, Others - Outcomes: PHQ 2/9: Depression screen [facility] 2/9 03/07/2018 02/22/2018 12/12/2017 07/28/2017 03/03/2017  Decreased Interest 0 0 0 0 0  Down, Depressed, Hopeless 0 0 0 0 0  PHQ - 2  Score 0 0 0 0 0  Some recent data might be hidden    Quality of Life: Quality of Life - 05/22/18 1347      Quality of Life   Select  Quality of Life      Quality of Life Scores   Health/Function Pre  30 %    Health/Function Post  30 %    Health/Function % Change  0 %    Socioeconomic Pre  27 %    Socioeconomic Post  30 %    Socioeconomic % Change   11.11 %    Psych/Spiritual Pre  30 %    Psych/Spiritual Post  30 %    Psych/Spiritual % Change  0 %    Family Pre  30 %    Family Post  30 %    Family % Change  0 %    GLOBAL Pre  29.42 %    GLOBAL Post  30 %    GLOBAL % Change  1.97 %       Personal Goals: Goals established at orientation with interventions provided to work toward goal. Personal Goals and Risk Factors at Admission - 02/16/18 1010      Core Components/Risk Factors/Patient Goals on Admission    Weight Management  Yes;Weight Loss    Intervention  Weight Management: Develop a combined nutrition and exercise program designed to reach desired caloric intake,  while maintaining appropriate intake of nutrient and fiber, sodium and fats, and appropriate energy expenditure required for the weight goal.;Weight Management: Provide education and appropriate resources to help participant work on and attain dietary goals.;Weight Management/Obesity: Establish reasonable short term and long term weight goals.;Obesity: Provide education and appropriate resources to help participant work on and attain dietary goals.    Admit Weight  141 lb 1.5 oz (64 kg)    Goal Weight: Short Term  135 lb (61.2 kg)    Goal Weight: Long Term  131 lb (59.4 kg)    Expected Outcomes  Short Term: Continue to assess and modify interventions until short term weight is achieved;Long Term: Adherence to nutrition and physical activity/exercise program aimed toward attainment of established weight goal;Weight Loss: Understanding of general recommendations for a balanced deficit meal plan, which promotes 1-2 lb  weight loss per week and includes a negative energy balance of [phone] kcal/d;Understanding recommendations for meals to include 15-35% energy as protein, 25-35% energy from fat, 35-60% energy from carbohydrates, less than '200mg'$  of dietary cholesterol, 20-35 gm of total fiber daily;Understanding of distribution of calorie intake throughout the day with the consumption of 4-5 meals/snacks    Hypertension  Yes    Intervention  Provide education on lifestyle modifcations including regular physical activity/exercise, weight management, moderate sodium restriction and increased consumption of fresh fruit, vegetables, and low fat dairy, alcohol moderation, and smoking cessation.;Monitor prescription use compliance.    Expected Outcomes  Short Term: Continued assessment and intervention until BP is < 140/73m HG in hypertensive participants. < 130/880mHG in hypertensive participants with diabetes, heart failure or chronic kidney disease.;Long Term: Maintenance of blood pressure at goal levels.    Lipids  Yes    Intervention  Provide education and support for participant on nutrition & aerobic/resistive exercise along with prescribed medications to achieve LDL '70mg'$ , HDL >'40mg'$ .    Expected Outcomes  Short Term: Participant states understanding of desired cholesterol values and is compliant with medications prescribed. Participant is following exercise prescription and nutrition guidelines.;Long Term: Cholesterol controlled with medications as prescribed, with individualized exercise RX and with personalized nutrition plan. Value goals: LDL < '70mg'$ , HDL > 40 mg.    Stress  Yes    Intervention  Offer individual and/or small group education and counseling on adjustment to heart disease, stress management and health-related lifestyle change. Teach and support self-help strategies.;Refer participants experiencing significant psychosocial distress to appropriate mental health specialists for further evaluation and  treatment. When possible, include family members and significant others in education/counseling sessions.    Expected Outcomes  Short Term: Participant demonstrates changes in health-related behavior, relaxation and other stress management skills, ability to obtain effective social support, and compliance with psychotropic medications if prescribed.;Long Term: Emotional wellbeing is indicated by absence of clinically significant psychosocial distress or social isolation.        Personal Goals Discharge: Goals and Risk Factor Review    Row Name 02/22/18 [id number]/19 1018 03/06/18 0838 03/31/18 1434 05/01/18 1642     Core Components/Risk Factors/Patient Goals Review   Personal Goals Review  Weight Management/Obesity;Hypertension;Stress;Lipids  (Pended)   Weight Management/Obesity;Hypertension;Stress;Lipids  Weight Management/Obesity;Hypertension;Stress;Lipids  Weight Management/Obesity;Hypertension;Stress;Lipids  Weight Management/Obesity;Hypertension;Stress;Lipids   Review  -  Pt with multiple CAD RFs willing to participate in CR Program. ElDeeannould like to get back to volunteering at her church.  Pt with multiple CAD RFs willing to participate in CR Program. ElCarlettas tolerating exercise well with her recent start in the program.  Pt with multiple CAD RFs willing to participate in CR Program. Grecia is tolerating exercise.  She feels that she is increasing her strength.   Pt with multiple CAD RFs willing to participate in CR Program. Shasha is tolerating exercise.  She has reported CP over a weekend.  This was treated by her cardiologist. No symptoms now.    Expected Outcomes  -  Pt will continue to participate in CR exercise, nutrition, and lifestlye opportunities.   Pt will continue to participate in CR exercise, nutrition, and lifestlye opportunities.   Pt will continue to participate in CR exercise, nutrition, and lifestlye opportunities.   Pt will continue to participate in CR exercise, nutrition,  and lifestlye opportunities.    Kennard Name 06/01/18 1521             Core Components/Risk Factors/Patient Goals Review   Personal Goals Review  Weight Management/Obesity;Hypertension;Stress;Lipids       Review  Jakaya graduated from Center with 36 complete sessions.  She was happy with her progress.        Expected Outcomes  Pt will continue to participate in exercise, nutrition, and lifestlye opportunities. Tykira plans to walk.           Exercise Goals and Review: Exercise Goals    Row Name 02/16/18 3903             Exercise Goals   Increase Physical Activity  Yes       Intervention  Provide advice, education, support and counseling about physical activity/exercise needs.;Develop an individualized exercise prescription for aerobic and resistive training based on initial evaluation findings, risk stratification, comorbidities and participant's personal goals.       Expected Outcomes  Short Term: Attend rehab on a regular basis to increase amount of physical activity.;Long Term: Add in home exercise to make exercise part of routine and to increase amount of physical activity.;Long Term: Exercising regularly at least 3-5 days a week.       Increase Strength and Stamina  Yes       Intervention  Provide advice, education, support and counseling about physical activity/exercise needs.;Develop an individualized exercise prescription for aerobic and resistive training based on initial evaluation findings, risk stratification, comorbidities and participant's personal goals.       Expected Outcomes  Short Term: Increase workloads from initial exercise prescription for resistance, speed, and METs.;Short Term: Perform resistance training exercises routinely during rehab and add in resistance training at home;Long Term: Improve cardiorespiratory fitness, muscular endurance and strength as measured by increased METs and functional capacity (6MWT)       Able to understand and use rate of perceived exertion  (RPE) scale  Yes       Intervention  Provide education and explanation on how to use RPE scale       Expected Outcomes  Short Term: Able to use RPE daily in rehab to express subjective intensity level;Long Term:  Able to use RPE to guide intensity level when exercising independently       Knowledge and understanding of Target Heart Rate Range (THRR)  Yes       Intervention  Provide education and explanation of THRR including how the numbers were predicted and where they are located for reference       Expected Outcomes  Short Term: Able to state/look up THRR;Long Term: Able to use THRR to govern intensity when exercising independently;Short Term: Able to use daily as guideline for intensity in rehab  Able to check pulse independently  Yes       Intervention  Provide education and demonstration on how to check pulse in carotid and radial arteries.;Review the importance of being able to check your own pulse for safety during independent exercise       Expected Outcomes  Short Term: Able to explain why pulse checking is important during independent exercise;Long Term: Able to check pulse independently and accurately       Understanding of Exercise Prescription  Yes       Intervention  Provide education, explanation, and written materials on patient's individual exercise prescription       Expected Outcomes  Short Term: Able to explain program exercise prescription;Long Term: Able to explain home exercise prescription to exercise independently          Nutrition & Weight - Outcomes: Pre Biometrics - 02/16/18 1014      Pre Biometrics   Height  5' 2.5" (1.588 m)    Weight  64 kg    Waist Circumference  33.75 inches    Hip Circumference  39.75 inches    Waist to Hip Ratio  0.85 %    BMI (Calculated)  25.38    Triceps Skinfold  36 mm    % Body Fat  39.3 %    Grip Strength  28 kg    Flexibility  12.5 in    Single Leg Stand  30 seconds      Post Biometrics - 05/10/18 1521       Post   Biometrics   Height  5' 2.5" (1.588 m)    Weight  65.5 kg    Waist Circumference  34 inches    Hip Circumference  39 inches    Waist to Hip Ratio  0.87 %    BMI (Calculated)  25.97    Triceps Skinfold  29 mm    % Body Fat  38.5 %    Grip Strength  29 kg    Flexibility  17 in    Single Leg Stand  10 seconds       Nutrition: Nutrition Therapy & Goals - 05/29/18 1413      Nutrition Therapy   Diet  consistent carbohydrate heart healthy      Personal Nutrition Goals   Nutrition Goal  Pt to identify and limit food sources of saturated fat, trans fat, and sodium    Personal Goal #2  Pt to identify and limit food sources of refined grains and simple sugars      Intervention Plan   Intervention  Prescribe, educate and counsel regarding individualized specific dietary modifications aiming towards targeted core components such as weight, hypertension, lipid management, diabetes, heart failure and other comorbidities.    Expected Outcomes  Short Term Goal: Understand basic principles of dietary content, such as calories, fat, sodium, cholesterol and nutrients.       Nutrition Discharge: Nutrition Assessments - 05/29/18 1414      MEDFICTS Scores   Pre Score  29    Post Score  15    Score Difference  -14       Education Questionnaire Score: Knowledge Questionnaire Score - 05/22/18 1346      Knowledge Questionnaire Score   Pre Score  18/24    Post Score  20/24       Goals reviewed with patient; copy given to patient.

## 2018-06-07 ENCOUNTER — Ambulatory Visit (INDEPENDENT_AMBULATORY_CARE_PROVIDER_SITE_OTHER): Payer: Medicare Other | Admitting: *Deleted

## 2018-06-07 DIAGNOSIS — I639 Cerebral infarction, unspecified: Secondary | ICD-10-CM

## 2018-06-08 NOTE — Progress Notes (Signed)
Carelink Summary Report / Loop Recorder 

## 2018-06-27 ENCOUNTER — Encounter (HOSPITAL_COMMUNITY): Payer: Self-pay | Admitting: Adult Health

## 2018-06-27 ENCOUNTER — Telehealth (HOSPITAL_COMMUNITY): Payer: Self-pay | Admitting: Adult Health

## 2018-06-27 ENCOUNTER — Other Ambulatory Visit (HOSPITAL_COMMUNITY): Payer: Self-pay

## 2018-06-27 MED ORDER — FUROSEMIDE 80 MG PO TABS: 80.0000 mg | ORAL_TABLET | Freq: Two times a day (BID) | ORAL | 6 refills | Status: DC

## 2018-06-27 NOTE — Telephone Encounter (Signed)
Called regarding elevated cardiomems reading. .    Instructed to add extra 40 mg lasix in the evening.   She will now take 80 mg lasix twice a day.   Katie [name] verbalized understanding.    Michio Thier NP-C  3:22 PM

## 2018-06-28 ENCOUNTER — Ambulatory Visit
Admission: RE | Admit: 2018-06-28 | Discharge: 2018-06-28 | Disposition: A | Discharge status: Home / Self Care | Payer: Medicare Other | Source: Ambulatory Visit | Attending: Internal Medicine | Admitting: Internal Medicine

## 2018-06-28 ENCOUNTER — Other Ambulatory Visit: Payer: Self-pay | Admitting: Internal Medicine

## 2018-06-28 DIAGNOSIS — M25511 Pain in right shoulder: Secondary | ICD-10-CM

## 2018-07-10 ENCOUNTER — Ambulatory Visit (INDEPENDENT_AMBULATORY_CARE_PROVIDER_SITE_OTHER): Payer: Medicare Other

## 2018-07-10 DIAGNOSIS — I639 Cerebral infarction, unspecified: Secondary | ICD-10-CM | POA: Diagnosis not present

## 2018-07-11 ENCOUNTER — Telehealth (HOSPITAL_COMMUNITY): Payer: Self-pay | Admitting: Adult Health

## 2018-07-11 ENCOUNTER — Other Ambulatory Visit: Payer: Self-pay

## 2018-07-11 NOTE — Telephone Encounter (Signed)
Called regarding poor readings on Cardiomems.   She has had multiple poor readings. I have provided her with the number to call [facility] for instructions regarding Cardiomems.   She given the number [phone] to call and try to work issues with her readings.   She also has concerns about tafamidis. Says she has not been able to obtain. I have sent a message to Dorian Heckle, HF pharmacist.   12:44 PM   Darrell Hauk 12:43 PM

## 2018-07-11 NOTE — Patient Outreach (Signed)
[facility]) Care Management  07/11/2018   Katie [name] [birth year] [id number]  Subjective: Successful call to the patient for assesment.  HIPAA verified.  Patient denies any chest pain, shortness of breath, swelling or falls.  She states that she did have swelling previously and she contacted her physician.  Her Lasix was changed to 80 mg in the am and 80 mg in the pm.  She states that the swelling has gone down.  Her weight today was 142.  She states that she is having pain in her legs that she rates at a 5/10.  When she went to her primary care she had a xray done on her neck and shoulder, which showed she has arthritis. The patient reports that she received a flu shot at her September appointment.  The patient has an appointment with her Cardiologist this Thursday and her primary care in March of next year.   Current Medications:  Current Outpatient Medications  Medication Sig Dispense Refill  . acetaminophen (TYLENOL) 500 MG tablet Take 1,000 mg by mouth every 6 (six) hours as needed for headache (pain).    Marland Kitchen amoxicillin (AMOXIL) 500 MG tablet Take 4 tablets (2,000 mg) 1 hour prior to dental procedures. 8 tablet 6  . aspirin 81 MG tablet Take 1 tablet (81 mg total) daily by mouth.    . butalbital-acetaminophen-caffeine (FIORICET, ESGIC) 50-325-40 MG tablet Take 1 tablet by mouth every 12 (twelve) hours as needed for headache. 14 tablet 0  . carvedilol (COREG) 6.25 MG tablet Take 0.5 tablets (3.125 mg total) by mouth 2 (two) times daily. 180 tablet 0  . clopidogrel (PLAVIX) 75 MG tablet Take 1 tablet (75 mg total) by mouth daily. 30 tablet 1  . feeding supplement, ENSURE ENLIVE, (ENSURE ENLIVE) LIQD Take 237 mLs by mouth 2 (two) times daily between meals. 237 mL 0  . furosemide (LASIX) 80 MG tablet Take 1 tablet (80 mg total) by mouth 2 (two) times daily. Take 1 tab in AM and 1/2 tab in PM 60 tablet 6  . Hypromellose (ARTIFICIAL TEARS OP) Place 1 drop into both eyes daily  as needed (dry eyes).    Marland Kitchen levothyroxine (SYNTHROID, LEVOTHROID) 25 MCG tablet Take 25 mcg by mouth daily before breakfast.    . potassium chloride SA (K-DUR,KLOR-CON) 20 MEQ tablet Take 1 tablet (20 mEq total) by mouth 2 (two) times daily. 60 tablet 0  . pravastatin (PRAVACHOL) 80 MG tablet Take 1 tablet (80 mg total) by mouth at bedtime. 30 tablet 11   No current facility-administered medications for this visit.     Functional Status:  In your present state of health, do you have any difficulty performing the following activities: 03/07/2018 12/12/2017  Hearing? N N  Vision? N N  Difficulty concentrating or making decisions? N N  Walking or climbing stairs? Y Y  Dressing or bathing? N N  Doing errands, shopping? N Y  Conservation officer, nature and eating ? - N  Using the Toilet? - N  In the past six months, have you accidently leaked urine? - N  Do you have problems with loss of bowel control? - N  Managing your Medications? - N  Managing your Finances? - N  Housekeeping or managing your Housekeeping? - N  Some recent data might be hidden    Fall/Depression Screening: Fall Risk  07/11/2018 04/21/2018 04/11/2018  Falls in the past year? 0 No No  Number falls in past yr: - - -  Injury with  Fall? - - -  Risk for fall due to : - - -  Follow up - - -   PHQ 2/9 Scores 03/07/2018 02/22/2018 12/12/2017 07/28/2017 03/03/2017 02/22/2017  PHQ - 2 Score 0 0 0 0 0 0    Assessment: Patient will continue to benefit from health coach outreach for disease management and support.  THN CM Care Plan Problem One     Most Recent Value  THN Long Term Goal   in 30 days the patient will improve her health and verbalize no impatient admissions  THN Long Term Goal Start Date  07/11/18  Interventions for Problem One Long Term Goal  Talked about her change with her fluid pill,  signs and symptoms of chf, action plan and diet.      Plan:  RN Health Coach will contact patient in the month of January and patient agrees to  next outreach.   Lazaro Arms RN, BSN, [place] Direct Dial:  [phone]  Fax: [phone]

## 2018-07-11 NOTE — Progress Notes (Signed)
Carelink Summary Report / Loop Recorder 

## 2018-07-13 ENCOUNTER — Other Ambulatory Visit: Payer: Self-pay

## 2018-07-13 ENCOUNTER — Ambulatory Visit (HOSPITAL_COMMUNITY)
Admission: RE | Admit: 2018-07-13 | Discharge: 2018-07-13 | Disposition: A | Discharge status: Home / Self Care | Payer: Medicare Other | Source: Ambulatory Visit | Attending: Cardiology | Admitting: Cardiology

## 2018-07-13 ENCOUNTER — Encounter (HOSPITAL_COMMUNITY): Payer: Self-pay

## 2018-07-13 VITALS — BP 110/66 | HR 72 | Wt 140.8 lb

## 2018-07-13 DIAGNOSIS — I340 Nonrheumatic mitral (valve) insufficiency: Secondary | ICD-10-CM | POA: Insufficient documentation

## 2018-07-13 DIAGNOSIS — Z800 Family history of malignant neoplasm of digestive organs: Secondary | ICD-10-CM | POA: Insufficient documentation

## 2018-07-13 DIAGNOSIS — G629 Polyneuropathy, unspecified: Secondary | ICD-10-CM | POA: Insufficient documentation

## 2018-07-13 DIAGNOSIS — I5032 Chronic diastolic (congestive) heart failure: Secondary | ICD-10-CM | POA: Diagnosis not present

## 2018-07-13 DIAGNOSIS — G5603 Carpal tunnel syndrome, bilateral upper limbs: Secondary | ICD-10-CM | POA: Insufficient documentation

## 2018-07-13 DIAGNOSIS — Z85528 Personal history of other malignant neoplasm of kidney: Secondary | ICD-10-CM | POA: Diagnosis not present

## 2018-07-13 DIAGNOSIS — E039 Hypothyroidism, unspecified: Secondary | ICD-10-CM | POA: Diagnosis not present

## 2018-07-13 DIAGNOSIS — Z79890 Hormone replacement therapy (postmenopausal): Secondary | ICD-10-CM | POA: Diagnosis not present

## 2018-07-13 DIAGNOSIS — Z7902 Long term (current) use of antithrombotics/antiplatelets: Secondary | ICD-10-CM | POA: Insufficient documentation

## 2018-07-13 DIAGNOSIS — Z8673 Personal history of transient ischemic attack (TIA), and cerebral infarction without residual deficits: Secondary | ICD-10-CM | POA: Diagnosis not present

## 2018-07-13 DIAGNOSIS — Z803 Family history of malignant neoplasm of breast: Secondary | ICD-10-CM | POA: Insufficient documentation

## 2018-07-13 DIAGNOSIS — G990 Autonomic neuropathy in diseases classified elsewhere: Secondary | ICD-10-CM

## 2018-07-13 DIAGNOSIS — E785 Hyperlipidemia, unspecified: Secondary | ICD-10-CM | POA: Insufficient documentation

## 2018-07-13 DIAGNOSIS — I130 Hypertensive heart and chronic kidney disease with heart failure and stage 1 through stage 4 chronic kidney disease, or unspecified chronic kidney disease: Secondary | ICD-10-CM | POA: Insufficient documentation

## 2018-07-13 DIAGNOSIS — Z9889 Other specified postprocedural states: Secondary | ICD-10-CM | POA: Diagnosis not present

## 2018-07-13 DIAGNOSIS — Z7982 Long term (current) use of aspirin: Secondary | ICD-10-CM | POA: Diagnosis not present

## 2018-07-13 DIAGNOSIS — E854 Organ-limited amyloidosis: Secondary | ICD-10-CM

## 2018-07-13 DIAGNOSIS — D472 Monoclonal gammopathy: Secondary | ICD-10-CM

## 2018-07-13 DIAGNOSIS — Z79899 Other long term (current) drug therapy: Secondary | ICD-10-CM | POA: Diagnosis not present

## 2018-07-13 DIAGNOSIS — N183 Chronic kidney disease, stage 3 unspecified: Secondary | ICD-10-CM

## 2018-07-13 DIAGNOSIS — Z833 Family history of diabetes mellitus: Secondary | ICD-10-CM | POA: Diagnosis not present

## 2018-07-13 DIAGNOSIS — Z905 Acquired absence of kidney: Secondary | ICD-10-CM | POA: Diagnosis not present

## 2018-07-13 DIAGNOSIS — I43 Cardiomyopathy in diseases classified elsewhere: Secondary | ICD-10-CM

## 2018-07-13 LAB — BASIC METABOLIC PANEL
Anion gap: 11 (ref 5–15)
BUN: 25 mg/dL — ABNORMAL HIGH (ref 8–23)
CO2: 29 mmol/L (ref 22–32)
Calcium: 9.8 mg/dL (ref 8.9–10.3)
Chloride: 102 mmol/L (ref 98–111)
Creatinine, Ser: 1.52 mg/dL — ABNORMAL HIGH (ref 0.44–1.00)
GFR calc Af Amer: 39 mL/min — ABNORMAL LOW (ref >60)
GFR calc non Af Amer: 34 mL/min — ABNORMAL LOW (ref >60)
Glucose, Bld: 93 mg/dL (ref 70–99)
Potassium: 3.8 mmol/L (ref 3.5–5.1)
Sodium: 142 mmol/L (ref 135–145)

## 2018-07-13 NOTE — Progress Notes (Signed)
Advanced Heart Failure Clinic Note PCP: Dr. 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 female  with history of chronic diastolic CHF, mitral regurgitation, CVA, and CKD returns for followup of CHF.   Patient was initially admitted with diastolic CHF in 9/89. Echo showed EF 60-65% with grade 3 diastolic dysfunction and moderate MR.  She had cath showing no coronary disease. 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  TEE this admission showed moderate-severe MR.  She was admitted again in 10/18 with acute on chronic diastolic CHF. Echo this admission showed EF 50-55%, severe LVH, restrictive diastolic dysfunction, severe MR, moderate-severe TR.  She was referred to Dr. [name] who saw her recently for mitral valve evaluation.  Upon review of studies, regurgitation thought to be more in the moderate range and likely functional.   Patient had PYP amyloid scan in 12/18, this was suggestive of transthyretin amyloidosis.  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   Admission for A/C diastolic HF 2/1-1/94/1740. Required IV diuresis, then transitioned to 80 mg PO lasix BID. Metop was DC'd due to low blood pressures. Cardiac MRI strongly suggestive of amyloidosis, moderate to severe MR (moderate by regurgitant fraction 41% but severe visually). Echo repeated: EF 40-45%, moderate to severe LVH, severe MR, severe dilation of LA and RA, moderate TR, PASP 49.  She had Mitraclip x 2 placed in 5/19.  Repeat echo post-mitraclip showed EF 55-60%, moderate LVH, mean gradient 3 mmHg across the MV with trace-mild MR.   Cardiomems was placed 6/19.    She presents today for regular foloow up. PADP is 12 by cardiomems. She has had multiple poor readings. She spoke with [facility] via phone, and had a normal reading this am after troubleshooting. She is going out of town 12/14 - 12/16 and not taking pillow with her. Overall she feels about the same. She  denies SOB with ADLs or running errands, mostly limited by arthritis and neuropathy. She finished cardiac rehab. Denies CP, orthopnea, or PND. Taking all medication as directed.   Labs (10/18): K 4.4, creatinine 1.27, LDL 56, HDL 39 Labs (11/18): K 3.9, creatinine 1.43, SPEP negative Labs (2/19): K 3.8, creatinine 1.40, hgb 13.4 Labs (5/19): K 4.1, creatinine 1.4, hgb 10.8 Labs (6/19): K 4.2, creatinine 1.4, hgb 12.4 Labs (7/19): K 4, creatinine 1.38  PMH: 1. HTN 2. Hyperlipidemia 3. 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  LINQ monitor placed.  4. Chronic diastolic CHF: She has Cardiomems.  - LHC (6/17): Normal coronaries.  - Echo (10/18): EF 50-55%, severe LVH with speckled myocardium concerning for amyloidosis, restrictive diastolic function with septal flattening, severe MR, moderate-severe TR, severe biatrial enlargement, PASP 54 mmHg.  - PYP amyloid scan: Grade 3, suggestive of transthyretin amyloidosis.  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   - Echo (5/19): EF 55-60%, moderate LVH c/w amyloidosis, Mitraclip x 2 with mean MV gradient 3 mmHg, trace-mild MR, moderate TR, PASP 62 mmHg.  - Echo (6/19): EF 55-60%, moderate LVH with speckled myocardium, trivial MR s/p Mitraclip with mean gradient 2 mmHg,  PASP 61.  5. Mitral regurgitation: Think mixed functional/degenerative - TEE (7/18): EF 45-50%, moderate-severe MR, mild-moderate TR.  - Echo (10/18) read as severe MR.  - 2/19 echo with severe MR.  - 2/19 cardiac MRI with moderate to severe MR.  - Mitraclip x 2 in 5/19.     - Echo 5/19 post-Mitraclip with mean MV gradient 3 mmHg, trace-mild MR.  6. CKD stage 3 7. Hypothyroidism 8. H/o renal cell CA s/p nephrectomy.  9. Bilateral carpal tunnel releases.   Social History   Socioeconomic History  . Marital status: Divorced    Spouse name: Not on file  . Number of children: 5  . Years of education: 59  . Highest education level: Not on file  Occupational History  . Occupation: retired    Fish farm manager: [employer]  . Financial resource strain: Not on file  . Food insecurity:    Worry: Not on file    Inability: Not on file  . Transportation needs:    Medical: Not on file    Non-medical: Not on file  Tobacco Use  . Smoking status: Never Smoker  . Smokeless tobacco: Never Used  Substance and Sexual Activity  . Alcohol use: No  . Drug use: No  . Sexual activity: Not Currently  Lifestyle  . Physical activity:    Days per week: Not on file    Minutes per session: Not on file  . Stress: Not on file  Relationships  . Social connections:    Talks on phone: Not on file    Gets together: Not on file    Attends religious service: Not on file    Active member of club or organization: Not on file    Attends meetings of clubs or organizations: Not on file    Relationship status: Not on file  . Intimate partner violence:    Fear of current or ex partner: Not on file    Emotionally abused: Not on file    Physically abused: Not on file    Forced sexual activity: Not on file  Other Topics Concern  . Not on file  Social History Narrative   Lives alone in a one story home.  Her son stays with her some.  Has 5 children, 10 grandchildren and 10 great grandchildren.  Retired Training and development officer at [employer].  Education: high school.     Family History  Problem Relation Age of Onset  . Stroke Brother   . Diabetes Mellitus II Brother   . Diabetes Mellitus II Sister   . Breast cancer Sister   . Colon cancer Sister    Review of systems complete and  found to be negative unless listed in HPI.    Current Outpatient Medications  Medication Sig Dispense Refill  . acetaminophen (TYLENOL) 500 MG tablet Take 1,000 mg by mouth every 6 (six) hours as needed for headache (pain).    Marland Kitchen amoxicillin (AMOXIL) 500 MG tablet Take 4 tablets (2,000 mg) 1 hour prior to dental procedures. 8 tablet 6  . aspirin 81 MG tablet Take 1 tablet (81 mg total) daily by mouth.    . butalbital-acetaminophen-caffeine (FIORICET, ESGIC) 50-325-40 MG tablet Take 1 tablet by mouth every 12 (twelve) hours as needed for headache. 14 tablet 0  . carvedilol (COREG) 6.25 MG tablet Take 0.5 tablets (3.125 mg total) by mouth 2 (two) times daily. 180 tablet 0  . clopidogrel (PLAVIX) 75 MG tablet Take 1 tablet (75 mg total) by mouth  daily. 30 tablet 1  . feeding supplement, ENSURE ENLIVE, (ENSURE ENLIVE) LIQD Take 237 mLs by mouth 2 (two) times daily between meals. 237 mL 0  . furosemide (LASIX) 80 MG tablet Take 80 mg by mouth 2 (two) times daily.    . Hypromellose (ARTIFICIAL TEARS OP) Place 1 drop into both eyes daily as needed (dry eyes).    Marland Kitchen levothyroxine (SYNTHROID, LEVOTHROID) 25 MCG tablet Take 25 mcg by mouth daily before breakfast.    . potassium chloride SA (K-DUR,KLOR-CON) 20 MEQ tablet Take 1 tablet (20 mEq total) by mouth 2 (two) times daily. 60 tablet 0  . pravastatin (PRAVACHOL) 80 MG tablet Take 1 tablet (80 mg total) by mouth at bedtime. 30 tablet 11   No current facility-administered medications for this encounter.    Vitals:   07/13/18 0859  BP: 110/66  Pulse: 72  SpO2: 99%  Weight: 63.9 kg (140 lb 12.8 oz)    Wt Readings from Last 3 Encounters:  07/13/18 63.9 kg (140 lb 12.8 oz)  05/10/18 65.5 kg (144 lb 6.4 oz)  04/21/18 64.1 kg (141 lb 4 oz)    Physical General: Well appearing. No resp difficulty. HEENT: Normal Neck: Supple. JVP 5-6. Carotids 2+ bilat; no bruits. No thyromegaly or nodule noted. Cor: PMI nondisplaced. RRR, No M/G/R noted Lungs:  CTAB, normal effort. Abdomen: Soft, non-tender, non-distended, no HSM. No bruits or masses. +BS  Extremities: No cyanosis, clubbing, or rash. R and LLE no edema.  Neuro: Alert & orientedx3, cranial nerves grossly intact. moves all 4 extremities w/o difficulty. Affect pleasant   Assessment/Plan: 1. Chronic diastolic CHF: Normal EF on 10/18 echo with severe LVH and speckled myocardium consistent with cardiac amyloidosis.  SPEP was negative. 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 Cardiac MRI also strongly suggestive of cardiac amyloidosis. Volume status stable by exam and Cardiomems. NYHA class II overall.  - Continue lasix 80 mg BID  - Continue carvedilol 3.125 mg BID. - Reinforced fluid restriction to < 2 L daily, sodium restriction to less than 2000 mg daily, and the importance of daily weights.   2. Mitral regurgitation: Mitral regurgitation seems to fluctuate somewhat based on loading conditions.  Most recently, echo in 2/19 suggested severe MR and cardiac MRI suggested moderate to severe MR (moderate by 41% regurgitant fraction but visually severe). Suspect that the etiology is mixed - functional as well as degenerative with thickening and restriction of posterior leaflet. She is now s/p Mitraclip procedure in 5/19. Post-clip echo in 6/19 showed mean gradient 2 mmHg across the MV with trace MR.  - No change.  3. CKD: Stage III - BMET today.  4. CVA: 7/18. Has LINQ monitor, no atrial fibrillation detected so far.  - Continue ASA 81.   - Continue Plavix. - Continue statin, lipids ok 09/2017. No change.  5. 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  - Awaiting Tafamidis.  6. Cardiac amyloidosis: Hereditary TTR amyloidosis.  She has peripheral neuropathy as well as bilateral carpal tunnel syndrome.   - Tafamidis has been approved  for her. We are working on getting her.   Doing well overall from CHF perspective. Working on tafamidis.   Shirley Friar, PA-C  07/13/2018 9:10 AM   Greater than 50% of the 25 minute visit was spent in counseling/coordination of care regarding disease state education, salt/fluid restriction, sliding scale diuretics, and medication compliance.

## 2018-07-13 NOTE — Patient Instructions (Signed)
Labs today We will only contact you if something comes back abnormal or we need to make some changes. Otherwise no news is good news!  Your physician recommends that you schedule a follow-up appointment in: 3 months with Dr. McLean   

## 2018-07-14 ENCOUNTER — Telehealth (HOSPITAL_COMMUNITY): Payer: Self-pay | Admitting: Licensed Clinical Social Worker

## 2018-07-14 ENCOUNTER — Encounter (HOSPITAL_COMMUNITY): Payer: Self-pay

## 2018-07-14 NOTE — Telephone Encounter (Signed)
CSW reached out pt to to discuss concerns expressed on SDOH assessment.  Pt reported that it is sometimes true that she runs out of money to buy more food.  Pt states that she never runs out of food but that she sometimes can't afford anything she wants.  Pt familiar with food pantries but has not visited one in months- no issues getting to and from pantries if she needs food.  Pt has also already applied for food stamps and gets $15/month.  Pt also reports issues with medical bills- pt has insurance and has Medicaid for extra help with insurance premiums.  CSW encouraged pt to call billing to discuss a payment plan that she can manage so bills don't go to collection.  CSW will continue to follow in clinic and assist as needed  Jorge Ny, [place] Worker [facility] [phone]

## 2018-07-14 NOTE — Addendum Note (Signed)
Encounter addended by: Jorge Ny, LCSW on: 07/14/2018 8:52 AM  Actions taken: Visit Navigator Flowsheet section accepted

## 2018-07-19 ENCOUNTER — Telehealth (HOSPITAL_COMMUNITY): Payer: Self-pay

## 2018-07-19 MED ORDER — TAFAMIDIS MEGLUMINE (CARDIAC) 20 MG PO CAPS: 80.0000 mg | ORAL_CAPSULE | Freq: Every day | ORAL | 11 refills | Status: DC

## 2018-07-19 NOTE — Telephone Encounter (Signed)
Patient approved for Vyndaqel 20mg  (4 tabs) daily. Called [facility], they state patient can fill script at [facility] as patient does not have an account for mail order with [facility].  Prescription sent to pharmacy. Patient aware of same. Patient will contact office if she has any difficulty with obtaining medication.

## 2018-07-20 ENCOUNTER — Telehealth (HOSPITAL_COMMUNITY): Payer: Self-pay

## 2018-07-20 MED ORDER — TAFAMIDIS MEGLUMINE (CARDIAC) 20 MG PO CAPS: 80.0000 mg | ORAL_CAPSULE | Freq: Every day | ORAL | 11 refills | Status: DC

## 2018-07-20 NOTE — Telephone Encounter (Signed)
Called and spoke with [facility] Pharmacist Iris, order for Vyndaqel 20mg  4 capsules by mouth daily phoned in for 120 capsules with 11 refills.  Pt aware she has to call pharmacy to arrange delivery. Will f/u with patient next week to ensure patient communicated with pharmacy.

## 2018-07-20 NOTE — Addendum Note (Signed)
Encounter addended by: Shirley Friar, PA-C on: 07/20/2018 4:21 PM  Actions taken: LOS modified

## 2018-07-24 ENCOUNTER — Telehealth (HOSPITAL_COMMUNITY): Payer: Self-pay

## 2018-07-24 NOTE — Telephone Encounter (Signed)
Pt left message on office voicemail with regard to new medication tafamidis.  Pt called, and she reports having concerns about side effects of medication.  Pt reassured that a lot of medications carry side effects, however it does not necessarily mean she would have a reaction.  She was advised she can start medication and alert the office if she think she is experiencing a side effect.  Pt was appreciative and will start medication.  Advised to call office if she has any questions.

## 2018-07-29 LAB — CUP PACEART REMOTE DEVICE CHECK
Date Time Interrogation Session: 20191107030532
Implantable Pulse Generator Implant Date: 20180720

## 2018-08-04 ENCOUNTER — Telehealth (HOSPITAL_COMMUNITY): Payer: Self-pay | Admitting: Adult Health

## 2018-08-04 ENCOUNTER — Encounter (HOSPITAL_COMMUNITY): Payer: Self-pay | Admitting: Adult Health

## 2018-08-04 NOTE — Telephone Encounter (Signed)
   Called regarding elevated cardiomems reading.   Instructed to take an extra 80 mg lasix + extra 20 meq potassium for 2 days.    Ms Augspurger verbalized understanding.   Akeem Heppler NP-C  3:13 PM

## 2018-08-07 ENCOUNTER — Telehealth (HOSPITAL_COMMUNITY): Payer: Self-pay | Admitting: Adult Health

## 2018-08-07 ENCOUNTER — Encounter (HOSPITAL_COMMUNITY): Payer: Self-pay | Admitting: Adult Health

## 2018-08-07 DIAGNOSIS — I5022 Chronic systolic (congestive) heart failure: Secondary | ICD-10-CM

## 2018-08-07 NOTE — Telephone Encounter (Signed)
   Cardiomems elevated.   Instructed to increase morning lasix to 120 mg in the morning and continue 80 mg in the pm.   Check BMET this week. Ms Costabile verbalized understanding.  Caretha Rumbaugh NP-C  3:24 PM

## 2018-08-07 NOTE — Addendum Note (Signed)
Addended by: Harvie Junior on: 08/07/2018 03:34 PM   Modules accepted: Orders

## 2018-08-07 NOTE — Telephone Encounter (Signed)
Attempted to call regarding elevated cardiomems.   Asked her to call HF clinic back.   Melaine Mcphee NP-C  .1:32 PM

## 2018-08-09 ENCOUNTER — Ambulatory Visit (HOSPITAL_COMMUNITY)
Admission: RE | Admit: 2018-08-09 | Discharge: 2018-08-09 | Disposition: A | Discharge status: Home / Self Care | Payer: Medicare Other | Source: Ambulatory Visit | Attending: Internal Medicine | Admitting: Internal Medicine

## 2018-08-09 DIAGNOSIS — I5022 Chronic systolic (congestive) heart failure: Secondary | ICD-10-CM | POA: Diagnosis not present

## 2018-08-09 LAB — BASIC METABOLIC PANEL
Anion gap: 8 (ref 5–15)
BUN: 24 mg/dL — ABNORMAL HIGH (ref 8–23)
CO2: 31 mmol/L (ref 22–32)
Calcium: 9.7 mg/dL (ref 8.9–10.3)
Chloride: 102 mmol/L (ref 98–111)
Creatinine, Ser: 1.36 mg/dL — ABNORMAL HIGH (ref 0.44–1.00)
GFR calc Af Amer: 45 mL/min — ABNORMAL LOW (ref >60)
GFR calc non Af Amer: 39 mL/min — ABNORMAL LOW (ref >60)
Glucose, Bld: 88 mg/dL (ref 70–99)
Potassium: 3.6 mmol/L (ref 3.5–5.1)
Sodium: 141 mmol/L (ref 135–145)

## 2018-08-14 ENCOUNTER — Ambulatory Visit (INDEPENDENT_AMBULATORY_CARE_PROVIDER_SITE_OTHER): Payer: Medicare Other

## 2018-08-14 DIAGNOSIS — I5022 Chronic systolic (congestive) heart failure: Secondary | ICD-10-CM | POA: Diagnosis not present

## 2018-08-15 LAB — CUP PACEART REMOTE DEVICE CHECK
Date Time Interrogation Session: 20200112033735
Implantable Pulse Generator Implant Date: 20180720

## 2018-08-15 NOTE — Progress Notes (Signed)
Carelink Summary Report / Loop Recorder 

## 2018-08-16 ENCOUNTER — Other Ambulatory Visit: Payer: Self-pay

## 2018-08-16 NOTE — Patient Outreach (Signed)
[facility]) Care Management  08/16/2018   Katie [name] 1945/10/29 [id number]  Subjective: Successful outreach to the patient for assessment.  HIPAA verified.  The patient states that she is not in the home at this time.  She states that she received the coupons I had mailed to her for ensure.  She denies any chest pain, shortness of breath, or swelling.  She states that she did have an episode of swelling and the doctor increased her Furosemide 80mg  to 120 mg in the am and 80 mg in the evening.  She also states that she is having an aching pain in her right arm that has gradually gotten worse.   She rates that pain at a 10/10 at times.  Discussed with the patient about informing her physician about the issue that she is having.  She verbalized understanding.  The patient states that she is taking her medications as she should and monitoring her food intake for salt.  She states that her next appointment with her Cardiologist is in March.   Current Medications:  Current Outpatient Medications  Medication Sig Dispense Refill  . acetaminophen (TYLENOL) 500 MG tablet Take 1,000 mg by mouth every 6 (six) hours as needed for headache (pain).    Marland Kitchen amoxicillin (AMOXIL) 500 MG tablet Take 4 tablets (2,000 mg) 1 hour prior to dental procedures. 8 tablet 6  . aspirin 81 MG tablet Take 1 tablet (81 mg total) daily by mouth.    . butalbital-acetaminophen-caffeine (FIORICET, ESGIC) 50-325-40 MG tablet Take 1 tablet by mouth every 12 (twelve) hours as needed for headache. 14 tablet 0  . carvedilol (COREG) 6.25 MG tablet Take 0.5 tablets (3.125 mg total) by mouth 2 (two) times daily. 180 tablet 0  . clopidogrel (PLAVIX) 75 MG tablet Take 1 tablet (75 mg total) by mouth daily. 30 tablet 1  . furosemide (LASIX) 80 MG tablet Take 80 mg by mouth 2 (two) times daily.    . Hypromellose (ARTIFICIAL TEARS OP) Place 1 drop into both eyes daily as needed (dry eyes).    Marland Kitchen levothyroxine (SYNTHROID,  LEVOTHROID) 25 MCG tablet Take 25 mcg by mouth daily before breakfast.    . potassium chloride SA (K-DUR,KLOR-CON) 20 MEQ tablet Take 1 tablet (20 mEq total) by mouth 2 (two) times daily. 60 tablet 0  . pravastatin (PRAVACHOL) 80 MG tablet Take 1 tablet (80 mg total) by mouth at bedtime. 30 tablet 11  . Tafamidis Meglumine, Cardiac, (VYNDAQEL) 20 MG CAPS Take 80 mg by mouth daily. 120 capsule 11  . feeding supplement, ENSURE ENLIVE, (ENSURE ENLIVE) LIQD Take 237 mLs by mouth 2 (two) times daily between meals. (Patient not taking: Reported on 08/16/2018) 237 mL 0   No current facility-administered medications for this visit.     Functional Status:  In your present state of health, do you have any difficulty performing the following activities: 03/07/2018 12/12/2017  Hearing? N N  Vision? N N  Difficulty concentrating or making decisions? N N  Walking or climbing stairs? Y Y  Dressing or bathing? N N  Doing errands, shopping? N Y  Conservation officer, nature and eating ? - N  Using the Toilet? - N  In the past six months, have you accidently leaked urine? - N  Do you have problems with loss of bowel control? - N  Managing your Medications? - N  Managing your Finances? - N  Housekeeping or managing your Housekeeping? - N  Some recent data might be  hidden    Fall/Depression Screening: Fall Risk  08/16/2018 07/11/2018 04/21/2018  Falls in the past year? 0 0 No  Number falls in past yr: - - -  Injury with Fall? - - -  Risk for fall due to : - - -  Follow up - - -   PHQ 2/9 Scores 03/07/2018 02/22/2018 12/12/2017 07/28/2017 03/03/2017 02/22/2017  PHQ - 2 Score 0 0 0 0 0 0    Assessment: Patient will continue to benefit from health coach outreach for disease management and support.  THN CM Care Plan Problem One     Most Recent Value  THN Long Term Goal   in 60 days the patient will improve her health and verbalize no impatient admissions  THN Long Term Goal Start Date  08/16/18  Interventions for Problem  One Long Term Goal  Reviewed with hte patient about salt in her diet, signs and symptoms of CHF and medication adherence.       Plan: RN Health Coach will contact patient in the month of March and patient agrees to next outreach.   Lazaro Arms RN, BSN, [place] Direct Dial:  [phone]  Fax: [phone]

## 2018-08-20 LAB — CUP PACEART REMOTE DEVICE CHECK
Date Time Interrogation Session: 20191210030617
Implantable Pulse Generator Implant Date: 20180720

## 2018-08-21 ENCOUNTER — Telehealth (HOSPITAL_COMMUNITY): Payer: Self-pay

## 2018-08-21 NOTE — Telephone Encounter (Signed)
error 

## 2018-08-31 ENCOUNTER — Telehealth (HOSPITAL_COMMUNITY): Payer: Self-pay | Admitting: Cardiology

## 2018-08-31 NOTE — Telephone Encounter (Signed)
cardioMems reports having been reviewed by Amy Clegg,NP   Multiple reports this week, have been reported as a "ERROR". Advised patient to remember to send transmission w/o brassiere, remove oxygen tank from room that equipment is in, also advised patient to contact [place] for further trouble shoot/recommendations.     Patient reports she will contact [facility] but she also believes she knows what may have been causing the issue, will discuss further with customer support.

## 2018-09-04 ENCOUNTER — Other Ambulatory Visit: Payer: Self-pay | Admitting: Internal Medicine

## 2018-09-04 DIAGNOSIS — Z1231 Encounter for screening mammogram for malignant neoplasm of breast: Secondary | ICD-10-CM

## 2018-09-14 ENCOUNTER — Ambulatory Visit (INDEPENDENT_AMBULATORY_CARE_PROVIDER_SITE_OTHER): Payer: Medicare Other

## 2018-09-14 DIAGNOSIS — I639 Cerebral infarction, unspecified: Secondary | ICD-10-CM | POA: Diagnosis not present

## 2018-09-15 ENCOUNTER — Other Ambulatory Visit: Payer: Self-pay | Admitting: Cardiovascular Disease

## 2018-09-15 LAB — CUP PACEART REMOTE DEVICE CHECK
Date Time Interrogation Session: 20200214051155
Implantable Pulse Generator Implant Date: 20180720

## 2018-09-26 NOTE — Progress Notes (Signed)
Carelink Summary Report / Loop Recorder 

## 2018-09-27 ENCOUNTER — Other Ambulatory Visit (HOSPITAL_COMMUNITY): Payer: Self-pay | Admitting: Interventional Radiology

## 2018-09-27 DIAGNOSIS — I639 Cerebral infarction, unspecified: Secondary | ICD-10-CM

## 2018-10-09 ENCOUNTER — Ambulatory Visit
Admission: RE | Admit: 2018-10-09 | Discharge: 2018-10-09 | Disposition: A | Discharge status: Home / Self Care | Payer: Medicare Other | Source: Ambulatory Visit | Attending: Internal Medicine | Admitting: Internal Medicine

## 2018-10-09 DIAGNOSIS — Z1231 Encounter for screening mammogram for malignant neoplasm of breast: Secondary | ICD-10-CM

## 2018-10-10 ENCOUNTER — Other Ambulatory Visit: Payer: Self-pay | Admitting: Internal Medicine

## 2018-10-10 DIAGNOSIS — R928 Other abnormal and inconclusive findings on diagnostic imaging of breast: Secondary | ICD-10-CM

## 2018-10-11 ENCOUNTER — Encounter (HOSPITAL_COMMUNITY): Payer: Self-pay

## 2018-10-11 ENCOUNTER — Ambulatory Visit (HOSPITAL_COMMUNITY)
Admission: RE | Admit: 2018-10-11 | Discharge: 2018-10-11 | Disposition: A | Discharge status: Home / Self Care | Payer: Medicare Other | Source: Ambulatory Visit | Attending: Interventional Radiology | Admitting: Interventional Radiology

## 2018-10-11 DIAGNOSIS — I639 Cerebral infarction, unspecified: Secondary | ICD-10-CM

## 2018-10-12 ENCOUNTER — Other Ambulatory Visit: Payer: Self-pay

## 2018-10-12 ENCOUNTER — Ambulatory Visit
Admission: RE | Admit: 2018-10-12 | Discharge: 2018-10-12 | Disposition: A | Discharge status: Home / Self Care | Payer: Medicare Other | Source: Ambulatory Visit | Attending: Internal Medicine | Admitting: Internal Medicine

## 2018-10-12 ENCOUNTER — Other Ambulatory Visit: Payer: Self-pay | Admitting: Internal Medicine

## 2018-10-12 DIAGNOSIS — R928 Other abnormal and inconclusive findings on diagnostic imaging of breast: Secondary | ICD-10-CM

## 2018-10-12 DIAGNOSIS — N6320 Unspecified lump in the left breast, unspecified quadrant: Secondary | ICD-10-CM

## 2018-10-13 ENCOUNTER — Ambulatory Visit (HOSPITAL_COMMUNITY)
Admission: RE | Admit: 2018-10-13 | Discharge: 2018-10-13 | Disposition: A | Discharge status: Home / Self Care | Payer: Medicare Other | Source: Ambulatory Visit | Attending: Cardiology | Admitting: Cardiology

## 2018-10-13 VITALS — BP 108/64 | HR 76 | Wt 143.4 lb

## 2018-10-13 DIAGNOSIS — I5032 Chronic diastolic (congestive) heart failure: Secondary | ICD-10-CM | POA: Insufficient documentation

## 2018-10-13 DIAGNOSIS — Z79890 Hormone replacement therapy (postmenopausal): Secondary | ICD-10-CM | POA: Diagnosis not present

## 2018-10-13 DIAGNOSIS — Z905 Acquired absence of kidney: Secondary | ICD-10-CM | POA: Insufficient documentation

## 2018-10-13 DIAGNOSIS — Z85528 Personal history of other malignant neoplasm of kidney: Secondary | ICD-10-CM | POA: Insufficient documentation

## 2018-10-13 DIAGNOSIS — Z8673 Personal history of transient ischemic attack (TIA), and cerebral infarction without residual deficits: Secondary | ICD-10-CM | POA: Diagnosis not present

## 2018-10-13 DIAGNOSIS — Z7982 Long term (current) use of aspirin: Secondary | ICD-10-CM | POA: Insufficient documentation

## 2018-10-13 DIAGNOSIS — N183 Chronic kidney disease, stage 3 (moderate): Secondary | ICD-10-CM | POA: Insufficient documentation

## 2018-10-13 DIAGNOSIS — I43 Cardiomyopathy in diseases classified elsewhere: Secondary | ICD-10-CM | POA: Insufficient documentation

## 2018-10-13 DIAGNOSIS — E039 Hypothyroidism, unspecified: Secondary | ICD-10-CM | POA: Insufficient documentation

## 2018-10-13 DIAGNOSIS — I130 Hypertensive heart and chronic kidney disease with heart failure and stage 1 through stage 4 chronic kidney disease, or unspecified chronic kidney disease: Secondary | ICD-10-CM | POA: Insufficient documentation

## 2018-10-13 DIAGNOSIS — Z7902 Long term (current) use of antithrombotics/antiplatelets: Secondary | ICD-10-CM | POA: Diagnosis not present

## 2018-10-13 DIAGNOSIS — I340 Nonrheumatic mitral (valve) insufficiency: Secondary | ICD-10-CM | POA: Diagnosis not present

## 2018-10-13 DIAGNOSIS — E854 Organ-limited amyloidosis: Secondary | ICD-10-CM | POA: Diagnosis not present

## 2018-10-13 DIAGNOSIS — G5603 Carpal tunnel syndrome, bilateral upper limbs: Secondary | ICD-10-CM | POA: Diagnosis not present

## 2018-10-13 DIAGNOSIS — Z823 Family history of stroke: Secondary | ICD-10-CM | POA: Insufficient documentation

## 2018-10-13 DIAGNOSIS — Z79899 Other long term (current) drug therapy: Secondary | ICD-10-CM | POA: Diagnosis not present

## 2018-10-13 DIAGNOSIS — G629 Polyneuropathy, unspecified: Secondary | ICD-10-CM | POA: Insufficient documentation

## 2018-10-13 DIAGNOSIS — E785 Hyperlipidemia, unspecified: Secondary | ICD-10-CM | POA: Insufficient documentation

## 2018-10-13 LAB — LIPID PANEL
Cholesterol: 188 mg/dL (ref 0–200)
HDL: 70 mg/dL (ref >40)
LDL Cholesterol: 107 mg/dL — ABNORMAL HIGH (ref 0–99)
Total CHOL/HDL Ratio: 2.7 ratio
Triglycerides: 54 mg/dL (ref <150)
VLDL: 11 mg/dL (ref 0–40)

## 2018-10-13 LAB — BASIC METABOLIC PANEL WITH GFR
Anion gap: 10 (ref 5–15)
BUN: 21 mg/dL (ref 8–23)
CO2: 26 mmol/L (ref 22–32)
Calcium: 9.8 mg/dL (ref 8.9–10.3)
Chloride: 105 mmol/L (ref 98–111)
Creatinine, Ser: 1.31 mg/dL — ABNORMAL HIGH (ref 0.44–1.00)
GFR calc Af Amer: 47 mL/min — ABNORMAL LOW (ref >60)
GFR calc non Af Amer: 40 mL/min — ABNORMAL LOW (ref >60)
Glucose, Bld: 88 mg/dL (ref 70–99)
Potassium: 3.6 mmol/L (ref 3.5–5.1)
Sodium: 141 mmol/L (ref 135–145)

## 2018-10-13 NOTE — Patient Instructions (Signed)
Labs were done today. We will call you with any ABNORMAL results. No news is good news!  Your physician recommends that you schedule a follow-up appointment in: 3 MONTHS

## 2018-10-15 NOTE — Progress Notes (Signed)
PCP: Dr. [name] HF Cardiology: Dr. [name]  73 y.o. with history of chronic diastolic CHF, mitral regurgitation, CVA, and CKD returns for followup of CHF.   Patient was initially admitted with diastolic CHF in 5/17. Echo showed EF 60-65% with grade 3 diastolic dysfunction and moderate MR.  She had cath showing no coronary disease. 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  TEE this admission showed moderate-severe MR.  She was admitted again in 10/18 with acute on chronic diastolic CHF. Echo this admission showed EF 50-55%, severe LVH, restrictive diastolic dysfunction, severe MR, moderate-severe TR.  She was referred to Dr. [name] who saw her recently for mitral valve evaluation.  Upon review of studies, regurgitation thought to be more in the moderate range and likely functional.   Patient had PYP amyloid scan in 12/18, this was suggestive of transthyretin amyloidosis.  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   Admission for A/C diastolic HF 6/1-6/01/3709. Required IV diuresis, then transitioned to 80 mg PO lasix BID. Metop was DC'd due to low blood pressures. Cardiac MRI strongly suggestive of amyloidosis, moderate to severe MR (moderate by regurgitant fraction 41% but severe visually). Echo repeated: EF 40-45%, moderate to severe LVH, severe MR, severe dilation of LA and RA, moderate TR, PASP 49.  She had Mitraclip x 2 placed in 5/19.  Repeat echo post-mitraclip showed EF 55-60%, moderate LVH, mean gradient 3 mmHg across the MV with trace-mild MR.   Cardiomems was placed 6/19.    She returns today for followup of CHF.  PADP is 12-15 by Cardiomems over the last 3 days. She fatigues easiliy and is occasionally short of breath with heavier exertion.  She does fine walking on flat ground without dyspnea though.  No chest pain.  No orthopnea/PND.  She is on tafamidis.   Labs (10/18): K 4.4, creatinine 1.27, LDL 56, HDL 39 Labs (11/18): K 3.9,  creatinine 1.43, SPEP negative Labs (2/19): K 3.8, creatinine 1.40, hgb 13.4 Labs (5/19): K 4.1, creatinine 1.4, hgb 10.8 Labs (6/19): K 4.2, creatinine 1.4, hgb 12.4 Labs (7/19): K 4, creatinine 1.38 Labs (1/20): K 3.6, creatinine 1.36  PMH: 1. HTN 2. Hyperlipidemia 3. 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  LINQ monitor placed.  4. Chronic diastolic CHF: She has Cardiomems.  - LHC (6/17): Normal coronaries.  - Echo (10/18): EF 50-55%, severe LVH with speckled myocardium concerning for amyloidosis, restrictive diastolic function with septal flattening, severe MR, moderate-severe TR, severe biatrial enlargement, PASP 54 mmHg.  - PYP amyloid scan: Grade 3, suggestive of transthyretin amyloidosis.  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   - Echo (5/19): EF 55-60%, moderate LVH c/w amyloidosis, Mitraclip x 2 with mean MV gradient 3 mmHg, trace-mild MR, moderate TR, PASP 62 mmHg.  - Echo (6/19): EF 55-60%, moderate LVH with speckled myocardium, trivial MR s/p Mitraclip with mean gradient 2 mmHg, PASP 61.  5. Mitral regurgitation: Think mixed functional/degenerative - TEE (7/18): EF 45-50%, moderate-severe MR, mild-moderate TR.  - Echo (10/18) read as severe MR.  - 2/19 echo with severe MR.  -  2/19 cardiac MRI with moderate to severe MR.  - Mitraclip x 2 in 5/19.   - Echo 5/19 post-Mitraclip with mean MV gradient 3 mmHg, trace-mild MR.  6. CKD stage 3 7. Hypothyroidism 8. H/o renal cell CA s/p nephrectomy.  9. Bilateral carpal tunnel releases.   Social History    Socioeconomic History  . Marital status: Divorced    Spouse name: Not on file  . Number of children: 5  . Years of education: 76  . Highest education level: Not on file  Occupational History  . Occupation: retired    Fish farm manager: [employer]  . Financial resource strain: Somewhat hard  . Food insecurity:    Worry: Sometimes true    Inability: Sometimes true  . Transportation needs:    Medical: No    Non-medical: No  Tobacco Use  . Smoking status: Never Smoker  . Smokeless tobacco: Never Used  Substance and Sexual Activity  . Alcohol use: No  . Drug use: No  . Sexual activity: Not Currently  Lifestyle  . Physical activity:    Days per week: Not on file    Minutes per session: Not on file  . Stress: Not on file  Relationships  . Social connections:    Talks on phone: Not on file    Gets together: Not on file    Attends religious service: Not on file    Active member of club or organization: Not on file    Attends meetings of clubs or organizations: Not on file    Relationship status: Not on file  . Intimate partner violence:    Fear of current or ex partner: Not on file    Emotionally abused: Not on file    Physically abused: Not on file    Forced sexual activity: Not on file  Other Topics Concern  . Not on file  Social History Narrative   Lives alone in a one story home.  Her son stays with her some.  Has 5 children, 10 grandchildren and 10 great grandchildren.  Retired Training and development officer at [employer].  Education: high school.        07/14/18- list food insecurity but is aware of food pantries and gets $15 in food stamps, reports issues with medical bills but has insurance and gets extra help through medicaid for premiums- encouraged pt to call billing to set up reasonable payment plan so it doesn't go to collections.   Family History  Problem Relation Age of Onset  . Stroke Brother   . Diabetes Mellitus II Brother   . Diabetes Mellitus II Sister   .  Breast cancer Sister   . Colon cancer Sister    ROS: All systems reviewed and negative except as per HPI.   Current Outpatient Medications  Medication Sig Dispense Refill  . acetaminophen (TYLENOL) 500 MG tablet Take 1,000 mg by mouth every 6 (six) hours as needed for headache (pain).    Marland Kitchen amoxicillin (AMOXIL) 500 MG tablet Take 4 tablets (2,000 mg) 1 hour prior to dental procedures. 8 tablet 6  . aspirin 81 MG tablet Take 1 tablet (81 mg total) daily by mouth.    . butalbital-acetaminophen-caffeine (FIORICET, ESGIC) 50-325-40 MG tablet Take 1 tablet by mouth every 12 (twelve) hours as needed for headache. 14 tablet 0  . carvedilol (COREG) 6.25 MG tablet Take 0.5 tablets (3.125 mg total) by mouth 2 (two) times daily. 180 tablet 0  . clopidogrel (PLAVIX) 75 MG tablet Take  1 tablet (75 mg total) by mouth daily. 30 tablet 1  . feeding supplement, ENSURE ENLIVE, (ENSURE ENLIVE) LIQD Take 237 mLs by mouth 2 (two) times daily between meals. 237 mL 0  . furosemide (LASIX) 80 MG tablet Take 80 mg by mouth 2 (two) times daily.    . Hypromellose (ARTIFICIAL TEARS OP) Place 1 drop into both eyes daily as needed (dry eyes).    Marland Kitchen levothyroxine (SYNTHROID, LEVOTHROID) 25 MCG tablet Take 25 mcg by mouth daily before breakfast.    . potassium chloride SA (K-DUR,KLOR-CON) 20 MEQ tablet Take 1 tablet (20 mEq total) by mouth 2 (two) times daily. 60 tablet 0  . pravastatin (PRAVACHOL) 80 MG tablet Take 1 tablet (80 mg total) by mouth at bedtime. Please make yearly appt with Dr. Acie Fredrickson for June for future refills. 1st attempt 30 tablet 4  . Tafamidis Meglumine, Cardiac, (VYNDAQEL) 20 MG CAPS Take 80 mg by mouth daily. 120 capsule 11   No current facility-administered medications for this encounter.    BP 108/64   Pulse 76   Wt 65 kg (143 lb 6.4 oz)   LMP 02/12/2013   SpO2 95%   BMI 25.81 kg/m  General: NAD Neck: No JVD, no thyromegaly or thyroid nodule.  Lungs: Clear to auscultation bilaterally with  normal respiratory effort. CV: Nondisplaced PMI.  Heart regular S1/S2, no S3/S4, no murmur.  No peripheral edema.  No carotid bruit.  Normal pedal pulses.  Abdomen: Soft, nontender, no hepatosplenomegaly, no distention.  Skin: Intact without lesions or rashes.  Neurologic: Alert and oriented x 3.  Psych: Normal affect. Extremities: No clubbing or cyanosis.  HEENT: Normal.   Assessment/Plan: 1. Chronic diastolic CHF: Normal EF on 10/18 echo with severe LVH and speckled myocardium consistent with cardiac amyloidosis.  SPEP was negative. 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 Cardiac MRI also strongly suggestive of cardiac amyloidosis.  On exam today and by Cardiomems evaluation, she is not significantly volume overloaded.   NYHA class II.    - Continue Lasix 80 mg bid.  - Continue carvedilol 3.125 mg BID. 2. Mitral regurgitation: Mitral regurgitation seems to fluctuate somewhat based on loading conditions.  Most recently, echo in 2/19 suggested severe MR and cardiac MRI suggested moderate to severe MR (moderate by 41% regurgitant fraction but visually severe). I suspect that the etiology is mixed - functional as well as degenerative with thickening and restriction of posterior leaflet. She is now s/p Mitraclip procedure in 5/19. Post-clip echo in 6/19 showed mean gradient 2 mmHg across the MV with trace MR.  3. CKD: Stage III.  BMET today.  4. CVA: 7/18. Has LINQ monitor, no atrial fibrillation detected so far.  - Continue ASA 81.   - Continue Plavix. - Continue statin, check lipids.  5. 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  6. Cardiac amyloidosis: Hereditary TTR amyloidosis.  She has peripheral neuropathy as well as bilateral carpal tunnel syndrome.   - She is now on tafamidis, no side effects.    Followup in 3 months.    Loralie Champagne,  10/15/2018 9:10 PM

## 2018-10-17 ENCOUNTER — Other Ambulatory Visit: Payer: Self-pay

## 2018-10-17 ENCOUNTER — Ambulatory Visit (INDEPENDENT_AMBULATORY_CARE_PROVIDER_SITE_OTHER): Payer: Medicare Other | Admitting: *Deleted

## 2018-10-17 DIAGNOSIS — I639 Cerebral infarction, unspecified: Secondary | ICD-10-CM

## 2018-10-18 LAB — CUP PACEART REMOTE DEVICE CHECK
Date Time Interrogation Session: 20200318111726
Implantable Pulse Generator Implant Date: 20180720

## 2018-10-19 ENCOUNTER — Telehealth (HOSPITAL_COMMUNITY): Payer: Self-pay

## 2018-10-19 NOTE — Telephone Encounter (Signed)
Pt agreed to f/u in 1 year with mra. AW  °

## 2018-10-20 ENCOUNTER — Other Ambulatory Visit: Payer: Self-pay

## 2018-10-20 ENCOUNTER — Encounter: Payer: Self-pay | Admitting: Neurology

## 2018-10-20 ENCOUNTER — Ambulatory Visit (INDEPENDENT_AMBULATORY_CARE_PROVIDER_SITE_OTHER): Payer: Medicare Other | Admitting: Neurology

## 2018-10-20 VITALS — BP 110/70 | HR 72 | Temp 97.5°F | Ht 63.0 in | Wt 143.5 lb

## 2018-10-20 DIAGNOSIS — E854 Organ-limited amyloidosis: Secondary | ICD-10-CM | POA: Diagnosis not present

## 2018-10-20 DIAGNOSIS — D472 Monoclonal gammopathy: Secondary | ICD-10-CM

## 2018-10-20 DIAGNOSIS — G990 Autonomic neuropathy in diseases classified elsewhere: Secondary | ICD-10-CM | POA: Diagnosis not present

## 2018-10-20 NOTE — Patient Instructions (Signed)
Return to clinic in 9 months 

## 2018-10-20 NOTE — Patient Outreach (Signed)
[facility]) Care Management  10/20/2018   Katie [name] 11-Jul-1946 [id number]  Subjective: Successful call to the patient.  HIPAA verified.  The patient states that she is doing ok.  She denies any chest pain, shortness of breath, swelling or falls.  She states that her weight today was 143 lbs.  She is taking her medications.  She had an appointment with her cardiologist on 3/2 and she states that they told her she was doing fine.  She had to have a mammogram and they found a mass on the left breast.  She went today to have a biopsy done.  She states that she goes on the 25th to speak with the surgeon and find out the results of the biopsy and if she will have surgery.  Advised the patient when she leaves the home to avoid people who are sick, washing hands with soap and water or using hand sanitizer.  She verbalized understanding.  Current Medications:  Current Outpatient Medications  Medication Sig Dispense Refill  . acetaminophen (TYLENOL) 500 MG tablet Take 1,000 mg by mouth every 6 (six) hours as needed for headache (pain).    Marland Kitchen amoxicillin (AMOXIL) 500 MG tablet Take 4 tablets (2,000 mg) 1 hour prior to dental procedures. 8 tablet 6  . aspirin 81 MG tablet Take 1 tablet (81 mg total) daily by mouth.    . butalbital-acetaminophen-caffeine (FIORICET, ESGIC) 50-325-40 MG tablet Take 1 tablet by mouth every 12 (twelve) hours as needed for headache. 14 tablet 0  . carvedilol (COREG) 6.25 MG tablet Take 0.5 tablets (3.125 mg total) by mouth 2 (two) times daily. 180 tablet 0  . clopidogrel (PLAVIX) 75 MG tablet Take 1 tablet (75 mg total) by mouth daily. 30 tablet 1  . feeding supplement, ENSURE ENLIVE, (ENSURE ENLIVE) LIQD Take 237 mLs by mouth 2 (two) times daily between meals. 237 mL 0  . furosemide (LASIX) 80 MG tablet Take 80 mg by mouth 2 (two) times daily.    . Hypromellose (ARTIFICIAL TEARS OP) Place 1 drop into both eyes daily as needed (dry eyes).    Marland Kitchen levothyroxine  (SYNTHROID, LEVOTHROID) 25 MCG tablet Take 25 mcg by mouth daily before breakfast.    . potassium chloride SA (K-DUR,KLOR-CON) 20 MEQ tablet Take 1 tablet (20 mEq total) by mouth 2 (two) times daily. 60 tablet 0  . pravastatin (PRAVACHOL) 80 MG tablet Take 1 tablet (80 mg total) by mouth at bedtime. Please make yearly appt with Dr. Acie Fredrickson for June for future refills. 1st attempt 30 tablet 4  . Tafamidis Meglumine, Cardiac, (VYNDAQEL) 20 MG CAPS Take 80 mg by mouth daily. 120 capsule 11  . traMADol (ULTRAM) 50 MG tablet TAKE 1 TABLET BY MOUTH DAILY AS NEEDED FOR LEG AND BACK PAIN     No current facility-administered medications for this visit.     Functional Status:  In your present state of health, do you have any difficulty performing the following activities: 03/07/2018 12/12/2017  Hearing? N N  Vision? N N  Difficulty concentrating or making decisions? N N  Walking or climbing stairs? Y Y  Dressing or bathing? N N  Doing errands, shopping? N Y  Conservation officer, nature and eating ? - N  Using the Toilet? - N  In the past six months, have you accidently leaked urine? - N  Do you have problems with loss of bowel control? - N  Managing your Medications? - N  Managing your Finances? - N  Housekeeping or managing your Housekeeping? - N  Some recent data might be hidden    Fall/Depression Screening: Fall Risk  10/20/2018 08/16/2018 07/11/2018  Falls in the past year? 0 0 0  Number falls in past yr: 0 - -  Injury with Fall? 0 - -  Risk for fall due to : - - -  Follow up Falls evaluation completed - -   PHQ 2/9 Scores 03/07/2018 02/22/2018 12/12/2017 07/28/2017 03/03/2017 02/22/2017  PHQ - 2 Score 0 0 0 0 0 0    Assessment: Patient will continue to benefit from health coach outreach for disease management and support.  THN CM Care Plan Problem One     Most Recent Value  THN Long Term Goal   in 60 days the patient will improve her health and verbalize no impatient admissions  THN Long Term Goal Start  Date  10/20/18  Interventions for Problem One Long Term Goal  Reviewed signs and symptoms of COPD, discussed the action plan and medication adherence, Encouraged patient to keep her appointments Advised her to avoid isick people, washing hands with soap and water or use hand sanitizer.       Plan: RN Health Coach will contact patient in the month of May and patient agrees to next outreach.   Lazaro Arms RN, BSN, [place] Direct Dial:  [phone]  Fax: [phone]

## 2018-10-20 NOTE — Progress Notes (Signed)
Follow-up Visit   Date: 10/20/18    Katie [name] MRN: [id number] DOB: 06/25/1946   Interim History: Katie [name] is a 73 y.o. 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  The patient was accompanied to the clinic by self.  History of present illness: She has history of heart failure since 2017 and underwent PYP amyloid scan in December suggestive of transthyretin amyloidosis.  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  She is being evaluated for patisiran treatment.  From a neurological standpoint, patient has had history of bilateral carpal tunnel release and continues to have hand tingling.  She has some weakness related to arthritis, but denies dropping objects. Over the past year, she has intermittent tingling of both feet.  She denies imbalance and has not suffered any falls.  She walks unassisted.  She manages her own household duties such as cooking, cleaning, and driving. Her son who is an amputee lives with her.  There is no family history of neuropathy.  Of note, she had a left cerebellar stroke in July 2018 secondary to embolic basilar occlusion and absent flow in bilateral SCA and PCAs.  She was treated with high-dose statin and dual antiplatelet therapy. Subsequent MRA from March 2019 shows no residual stenosis.  UPDATE 04/21/2018:  She continues to have paresthesias of hands, painful or bothersome.  She was recently approved for tafamidis, but pharmacy does not have it available to pick up.    UPDATE 10/20/2018:  She is here for follow-up visit.  She started tafamidis and is tolerating this well.  She has not noticed any worsening numbness/tingling  of the hands and feet.  Her balance is doing well and she continues to walk unassisted.  Overall, she is doing well and does not have any new neurological complaints.    Medications:  Current Outpatient Medications on File Prior to Visit  Medication Sig Dispense Refill  . acetaminophen (TYLENOL) 500 MG tablet Take 1,000 mg by mouth every 6 (six) hours as needed for headache (pain).    Marland Kitchen amoxicillin (AMOXIL) 500 MG tablet Take 4 tablets (2,000 mg) 1 hour prior to dental procedures. 8 tablet 6  . aspirin 81 MG tablet Take 1 tablet (81 mg total) daily by mouth.    . butalbital-acetaminophen-caffeine (FIORICET, ESGIC) 50-325-40 MG tablet Take 1 tablet by mouth every 12 (twelve) hours as needed for headache. 14 tablet 0  . carvedilol (COREG) 6.25 MG tablet Take 0.5 tablets (3.125 mg total) by mouth 2 (two) times daily. 180 tablet 0  . clopidogrel (PLAVIX) 75 MG tablet Take 1 tablet (75 mg total) by mouth daily. 30 tablet 1  . feeding supplement, ENSURE ENLIVE, (ENSURE ENLIVE) LIQD Take 237 mLs by mouth 2 (two) times daily between meals. 237 mL 0  . furosemide (LASIX) 80 MG tablet Take 80 mg by mouth 2 (two) times daily.    . Hypromellose (ARTIFICIAL TEARS OP) Place 1 drop into both eyes daily as needed (dry eyes).    Marland Kitchen levothyroxine (SYNTHROID, LEVOTHROID) 25 MCG tablet Take 25 mcg by mouth daily before breakfast.    . potassium chloride SA (K-DUR,KLOR-CON) 20 MEQ tablet Take 1 tablet (20 mEq total) by mouth 2 (two) times daily. 60 tablet 0  . pravastatin (  PRAVACHOL) 80 MG tablet Take 1 tablet (80 mg total) by mouth at bedtime. Please make yearly appt with Dr. Acie Fredrickson for June for future refills. 1st attempt 30 tablet 4  . Tafamidis Meglumine, Cardiac, (VYNDAQEL) 20 MG CAPS Take 80 mg by mouth daily. 120 capsule 11  . traMADol (ULTRAM) 50 MG tablet TAKE 1 TABLET BY MOUTH DAILY AS NEEDED FOR LEG AND BACK PAIN     No current facility-administered medications on file prior to visit.     Allergies:   Allergies  Allergen Reactions  . Tramadol Shortness Of Breath and Palpitations    Review of Systems:  CONSTITUTIONAL: No fevers, chills, night sweats, or weight loss.  EYES: No visual changes or eye pain ENT: No hearing changes.  No history of nose bleeds.   RESPIRATORY: No cough, wheezing +shortness of breath.   CARDIOVASCULAR: +chest pain, and palpitations.   GI: Negative for abdominal discomfort, blood in stools or black stools.  No recent change in bowel habits.   GU:  No history of incontinence.   MUSCLOSKELETAL: No history of joint pain +swelling.  No myalgias.   SKIN: Negative for lesions, rash, and itching.   ENDOCRINE: Negative for cold or heat intolerance, polydipsia or goiter.   PSYCH:  No depression or anxiety symptoms.   NEURO: As Above.   Vital Signs:  BP 110/70   Pulse 72   Temp (!) 97.5 F (36.4 C) (Oral)   Ht 5\' 3"  (1.6 m)   Wt 143 lb 8 oz (65.1 kg)   LMP 02/12/2013   SpO2 97%   BMI 25.42 kg/m    Neurological Exam: MENTAL STATUS including orientation to time, place, person, recent and remote memory, attention span and concentration, language, and fund of knowledge is normal.  Speech is not dysarthric.  CRANIAL NERVES: Pupils equal round and reactive to light.  Normal conjugate, extra-ocular eye movements in all directions of gaze.  No ptosis. Normal facial sensation.  Face is symmetric. Palate elevates symmetrically.  Tongue is midline.  MOTOR:  Motor strength is 5/5 in all extremities, except 5-/5 bilateral interosseus muscles.  No atrophy, fasciculations or abnormal movements.  No pronator drift.  Tone is normal.    MSRs:  Reflexes are 2+/4 throughout, except 1+/4 bilateral Achilles reflex.  SENSORY: Vibration is mildly reduced at the great toe bilaterally.  Sensation is intact at the MCP bilaterally.  COORDINATION/GAIT:    Gait narrow based and stable.  Stressed and tandem gait intact.  Data: MRA head 10/14/2017:  Negative intracranial MRA. No  residual or recurrent distal basilar Stenosis.  Cardiac SPECT 07/06/2017:  Visual and quantitative assessment (grade 3, H/CLL equal 1.94) are strongly suggestive of transthyretin amyloidosis.  MRI/A head 04/22/2017:    Chronic ischemic changes as above. No acute infarct.  No significant intracranial stenosis on MRA. Previously noted embolic occlusion distal basilar extending into the posterior cerebral artery bilaterally has resolved.  NCS/EMG of the right side 12/15/2017: The electrophysiologic findings are most consistent with a sensorimotor polyneuropathy, demyelinating with secondary axon loss, affecting the right upper extremity; moderate in degree electrically.    Alternatively, right carpal tunnel and cubital tunnel syndrome cannot be excluded. Correlate clinically.  IMPRESSION/PLAN: 1.  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 She started tafamidis in December 2019 and is tolerating this well.  There has been no progression of symptoms and symptoms are relatively mild and not bothersome.   2.  History of left cerebellar embolic stroke (3810) secondary to basilar occlusion, no residual deficits.    Return to clinic in 9 months   Thank you for allowing me to participate in patient's care.  If I can answer any additional questions, I would be pleased to do so.    Sincerely,    Criag Wicklund K. Posey Pronto, DO

## 2018-10-26 NOTE — Progress Notes (Signed)
Carelink Summary Report / Loop Recorder 

## 2018-10-30 ENCOUNTER — Telehealth (HOSPITAL_COMMUNITY): Payer: Self-pay | Admitting: Adult Health

## 2018-10-30 ENCOUNTER — Other Ambulatory Visit (HOSPITAL_COMMUNITY): Payer: Self-pay | Admitting: Adult Health

## 2018-10-30 MED ORDER — FUROSEMIDE 80 MG PO TABS: ORAL_TABLET | ORAL | 6 refills | Status: DC

## 2018-10-30 NOTE — Telephone Encounter (Signed)
   Cardiomems reading elevated.   I called and instructed to take an extra 40 mg of lasix for the next 2 days.   I will send in a refill for lasix today.   Ms Pickert verbalized understanding.   Alvah Lagrow NP-C  2:39 PM

## 2018-11-20 ENCOUNTER — Other Ambulatory Visit: Payer: Self-pay

## 2018-11-20 ENCOUNTER — Ambulatory Visit (INDEPENDENT_AMBULATORY_CARE_PROVIDER_SITE_OTHER): Payer: Medicare Other | Admitting: *Deleted

## 2018-11-20 DIAGNOSIS — I639 Cerebral infarction, unspecified: Secondary | ICD-10-CM

## 2018-11-20 LAB — CUP PACEART REMOTE DEVICE CHECK
Date Time Interrogation Session: 20200420134205
Implantable Pulse Generator Implant Date: 20180720

## 2018-11-27 ENCOUNTER — Telehealth (HOSPITAL_COMMUNITY): Payer: Self-pay | Admitting: Adult Health

## 2018-11-27 NOTE — Progress Notes (Signed)
Carelink Summary Report / Loop Recorder 

## 2018-11-27 NOTE — Telephone Encounter (Signed)
   Cardiomems elevated.   She had stopped taking the evening dose of lasix but restarted on 4/25.   Weight at home trending up to 144 pounds.   I have asked her to continue lasix 80 mg twice a day and take and extra 80 mg for the next 2 days.    Ms Gassen verbalized understanding.   Kam Kushnir NP-C  11:42 AM

## 2018-12-21 ENCOUNTER — Other Ambulatory Visit: Payer: Self-pay

## 2018-12-21 NOTE — Patient Outreach (Signed)
[facility]) Care Management  12/21/2018   Katie [name] [birth year] [id number]  Subjective: Successful outreach to the patient.  Two patient identifier obtained.   The patient states that she is doing well.  She denies any shortness of breath, swelling, chest pain, or falls.  She states that he weight today was 143 lbs.  She is taking her medication as prescribed.  She states that she is eating good meals three times a day and monitoring the salt.  She states that she just received a new scale from [facility].  She states that Melissa a RN with [facility] has spoken with her regarding her CHF. RN Health Coach called and left a message for Melissa to discuss engagement.     Current Medications:  Current Outpatient Medications  Medication Sig Dispense Refill  . acetaminophen (TYLENOL) 500 MG tablet Take 1,000 mg by mouth every 6 (six) hours as needed for headache (pain).    Marland Kitchen amoxicillin (AMOXIL) 500 MG tablet Take 4 tablets (2,000 mg) 1 hour prior to dental procedures. 8 tablet 6  . aspirin 81 MG tablet Take 1 tablet (81 mg total) daily by mouth.    . butalbital-acetaminophen-caffeine (FIORICET, ESGIC) 50-325-40 MG tablet Take 1 tablet by mouth every 12 (twelve) hours as needed for headache. 14 tablet 0  . carvedilol (COREG) 6.25 MG tablet Take 0.5 tablets (3.125 mg total) by mouth 2 (two) times daily. 180 tablet 0  . clopidogrel (PLAVIX) 75 MG tablet Take 1 tablet (75 mg total) by mouth daily. 30 tablet 1  . feeding supplement, ENSURE ENLIVE, (ENSURE ENLIVE) LIQD Take 237 mLs by mouth 2 (two) times daily between meals. 237 mL 0  . furosemide (LASIX) 80 MG tablet 80 mg twice a day with extra as needed. 100 tablet 6  . Hypromellose (ARTIFICIAL TEARS OP) Place 1 drop into both eyes daily as needed (dry eyes).    Marland Kitchen levothyroxine (SYNTHROID, LEVOTHROID) 25 MCG tablet Take 25 mcg by mouth daily before breakfast.    . potassium chloride SA (K-DUR,KLOR-CON) 20 MEQ tablet Take 1 tablet (20 mEq  total) by mouth 2 (two) times daily. 60 tablet 0  . pravastatin (PRAVACHOL) 80 MG tablet Take 1 tablet (80 mg total) by mouth at bedtime. Please make yearly appt with Dr. Acie Fredrickson for June for future refills. 1st attempt 30 tablet 4  . Tafamidis Meglumine, Cardiac, (VYNDAQEL) 20 MG CAPS Take 80 mg by mouth daily. 120 capsule 11  . traMADol (ULTRAM) 50 MG tablet TAKE 1 TABLET BY MOUTH DAILY AS NEEDED FOR LEG AND BACK PAIN     No current facility-administered medications for this visit.     Functional Status:  In your present state of health, do you have any difficulty performing the following activities: 03/07/2018  Hearing? N  Vision? N  Difficulty concentrating or making decisions? N  Walking or climbing stairs? Y  Dressing or bathing? N  Doing errands, shopping? N  Some recent data might be hidden    Fall/Depression Screening: Fall Risk  12/21/2018 10/20/2018 08/16/2018  Falls in the past year? 0 0 0  Number falls in past yr: - 0 -  Injury with Fall? - 0 -  Risk for fall due to : - - -  Follow up - Falls evaluation completed -   PHQ 2/9 Scores 03/07/2018 02/22/2018 12/12/2017 07/28/2017 03/03/2017 02/22/2017  PHQ - 2 Score 0 0 0 0 0 0    Assessment: Patient will continue to benefit from health coach  outreach for disease management and support. THN CM Care Plan Problem One     Most Recent Value  THN Long Term Goal   in 90 days the patient will improve her health and verbalize no impatient admissions  THN Long Term Goal Start Date  12/21/18  Interventions for Problem One Long Term Goal  Reviewed signs and symptoms of CHF, Reviewed action plan, discucssed diet, reveiwed medications and encouraged medication adherence, discussed preventative measures for covid-19.       Plan: RN Health Coach will contact patient in the month of August and patient agrees to next outreach.   Lazaro Arms RN, BSN, [place] Direct Dial:  [phone]   Fax: [phone]

## 2018-12-25 LAB — CUP PACEART REMOTE DEVICE CHECK
Date Time Interrogation Session: 20200523134221
Implantable Pulse Generator Implant Date: 20180720

## 2018-12-26 ENCOUNTER — Ambulatory Visit (INDEPENDENT_AMBULATORY_CARE_PROVIDER_SITE_OTHER): Payer: Medicare Other | Admitting: *Deleted

## 2018-12-26 DIAGNOSIS — I639 Cerebral infarction, unspecified: Secondary | ICD-10-CM | POA: Diagnosis not present

## 2018-12-26 DIAGNOSIS — I5022 Chronic systolic (congestive) heart failure: Secondary | ICD-10-CM

## 2018-12-27 ENCOUNTER — Other Ambulatory Visit: Payer: Self-pay | Admitting: Physician Assistant

## 2018-12-27 DIAGNOSIS — Z9889 Other specified postprocedural states: Secondary | ICD-10-CM

## 2018-12-29 ENCOUNTER — Telehealth (HOSPITAL_COMMUNITY): Payer: Self-pay

## 2018-12-29 MED ORDER — FUROSEMIDE 80 MG PO TABS: ORAL_TABLET | ORAL | 6 refills | Status: DC

## 2018-12-29 MED ORDER — CARVEDILOL 3.125 MG PO TABS: 3.1250 mg | ORAL_TABLET | Freq: Two times a day (BID) | ORAL | 1 refills | Status: DC

## 2018-12-29 NOTE — Telephone Encounter (Signed)
Advised patient to change how she takes Lasix. 80mg  in the morning and 40mg  in the evening. Pt verbalized understanding. Pt also requesting refills for Coreg 3.125mg  tabs.  Rx sent to pharmacy. Pt appreciative.

## 2018-12-29 NOTE — Telephone Encounter (Signed)
Stop torsemide, decrease Lasix to 80 qam/40 qpm.

## 2018-12-29 NOTE — Telephone Encounter (Signed)
Received call from [facility].  Blood pressure running low according to their monitor she yesterday 69/59> 88-44> 97/54; today 80/50>119/70. They called her after they received the low reading and she repeated the BP check as read above. Pt reports feeling dizzy every day, however does not last long.  She been taking all her meds as scheduled. Pt reports have been seeing black spots for about 2 days.  However, pt reports feeling well today and no black spots.  Pt denies any other symptoms.  Please advise

## 2019-01-03 NOTE — Progress Notes (Signed)
Carelink Summary Report / Loop Recorder 

## 2019-01-17 ENCOUNTER — Other Ambulatory Visit (HOSPITAL_COMMUNITY): Payer: Medicare Other

## 2019-01-22 ENCOUNTER — Telehealth (HOSPITAL_COMMUNITY): Payer: Self-pay

## 2019-01-22 NOTE — Telephone Encounter (Signed)
COVID-19 pre-appointment screening questions:   Do you have a history of COVID-19 or a positive test result in the past 7-10 days? No  To the best of your knowledge, have you been in close contact with anyone with a confirmed diagnosis of COVID 19? No  Have you had any one or more of the following: Fever, chills, cough, shortness of breath (out of the normal for you) or any flu-like symptoms? No  Are you experiencing any of the following symptoms that is new or out of usual for you: No  . Ear, nose or throat discomfort . Sore throat . Headache . Muscle Pain . Diarrhea . Loss of taste or smell   Reviewed all the following with patient: . Use of hand sanitizer when entering the building . Everyone is required to wear a mask in the building, if you do not have a mask we are happy to provide you with one when you arrive . NO Visitor guidelines   If patient answers YES to any of questions they must change to a virtual visit and place note in comments about symptoms  

## 2019-01-23 ENCOUNTER — Telehealth (HOSPITAL_COMMUNITY): Payer: Self-pay | Admitting: Cardiology

## 2019-01-23 ENCOUNTER — Other Ambulatory Visit (HOSPITAL_COMMUNITY): Payer: Self-pay | Admitting: Cardiology

## 2019-01-23 ENCOUNTER — Other Ambulatory Visit: Payer: Self-pay

## 2019-01-23 ENCOUNTER — Encounter (HOSPITAL_COMMUNITY): Payer: Self-pay | Admitting: Cardiology

## 2019-01-23 ENCOUNTER — Ambulatory Visit (HOSPITAL_COMMUNITY)
Admission: RE | Admit: 2019-01-23 | Discharge: 2019-01-23 | Disposition: A | Discharge status: Home / Self Care | Payer: Medicare Other | Source: Ambulatory Visit | Attending: Cardiology | Admitting: Cardiology

## 2019-01-23 ENCOUNTER — Ambulatory Visit (HOSPITAL_BASED_OUTPATIENT_CLINIC_OR_DEPARTMENT_OTHER)
Admission: RE | Admit: 2019-01-23 | Discharge: 2019-01-23 | Disposition: A | Discharge status: Home / Self Care | Payer: Medicare Other | Source: Ambulatory Visit | Attending: Physician Assistant | Admitting: Physician Assistant

## 2019-01-23 VITALS — BP 130/80 | HR 81 | Wt 146.2 lb

## 2019-01-23 DIAGNOSIS — I43 Cardiomyopathy in diseases classified elsewhere: Secondary | ICD-10-CM | POA: Diagnosis not present

## 2019-01-23 DIAGNOSIS — Z7982 Long term (current) use of aspirin: Secondary | ICD-10-CM | POA: Diagnosis not present

## 2019-01-23 DIAGNOSIS — Z833 Family history of diabetes mellitus: Secondary | ICD-10-CM | POA: Diagnosis not present

## 2019-01-23 DIAGNOSIS — I5032 Chronic diastolic (congestive) heart failure: Secondary | ICD-10-CM | POA: Diagnosis present

## 2019-01-23 DIAGNOSIS — Z7902 Long term (current) use of antithrombotics/antiplatelets: Secondary | ICD-10-CM | POA: Insufficient documentation

## 2019-01-23 DIAGNOSIS — Z79890 Hormone replacement therapy (postmenopausal): Secondary | ICD-10-CM | POA: Insufficient documentation

## 2019-01-23 DIAGNOSIS — Z800 Family history of malignant neoplasm of digestive organs: Secondary | ICD-10-CM | POA: Insufficient documentation

## 2019-01-23 DIAGNOSIS — I340 Nonrheumatic mitral (valve) insufficiency: Secondary | ICD-10-CM | POA: Diagnosis not present

## 2019-01-23 DIAGNOSIS — E039 Hypothyroidism, unspecified: Secondary | ICD-10-CM | POA: Diagnosis not present

## 2019-01-23 DIAGNOSIS — Z79899 Other long term (current) drug therapy: Secondary | ICD-10-CM | POA: Diagnosis not present

## 2019-01-23 DIAGNOSIS — M79606 Pain in leg, unspecified: Secondary | ICD-10-CM | POA: Diagnosis not present

## 2019-01-23 DIAGNOSIS — E854 Organ-limited amyloidosis: Secondary | ICD-10-CM

## 2019-01-23 DIAGNOSIS — Z905 Acquired absence of kidney: Secondary | ICD-10-CM | POA: Diagnosis not present

## 2019-01-23 DIAGNOSIS — Z9889 Other specified postprocedural states: Secondary | ICD-10-CM | POA: Diagnosis not present

## 2019-01-23 DIAGNOSIS — I130 Hypertensive heart and chronic kidney disease with heart failure and stage 1 through stage 4 chronic kidney disease, or unspecified chronic kidney disease: Secondary | ICD-10-CM | POA: Diagnosis not present

## 2019-01-23 DIAGNOSIS — N183 Chronic kidney disease, stage 3 (moderate): Secondary | ICD-10-CM | POA: Insufficient documentation

## 2019-01-23 DIAGNOSIS — Z85528 Personal history of other malignant neoplasm of kidney: Secondary | ICD-10-CM | POA: Diagnosis not present

## 2019-01-23 DIAGNOSIS — E785 Hyperlipidemia, unspecified: Secondary | ICD-10-CM | POA: Insufficient documentation

## 2019-01-23 DIAGNOSIS — G629 Polyneuropathy, unspecified: Secondary | ICD-10-CM | POA: Insufficient documentation

## 2019-01-23 DIAGNOSIS — Z803 Family history of malignant neoplasm of breast: Secondary | ICD-10-CM | POA: Diagnosis not present

## 2019-01-23 DIAGNOSIS — Z8673 Personal history of transient ischemic attack (TIA), and cerebral infarction without residual deficits: Secondary | ICD-10-CM | POA: Insufficient documentation

## 2019-01-23 DIAGNOSIS — Z823 Family history of stroke: Secondary | ICD-10-CM | POA: Diagnosis not present

## 2019-01-23 DIAGNOSIS — I739 Peripheral vascular disease, unspecified: Secondary | ICD-10-CM

## 2019-01-23 LAB — BASIC METABOLIC PANEL
Anion gap: 9 (ref 5–15)
BUN: 23 mg/dL (ref 8–23)
CO2: 25 mmol/L (ref 22–32)
Calcium: 9.5 mg/dL (ref 8.9–10.3)
Chloride: 108 mmol/L (ref 98–111)
Creatinine, Ser: 1.6 mg/dL — ABNORMAL HIGH (ref 0.44–1.00)
GFR calc Af Amer: 37 mL/min — ABNORMAL LOW (ref >60)
GFR calc non Af Amer: 32 mL/min — ABNORMAL LOW (ref >60)
Glucose, Bld: 91 mg/dL (ref 70–99)
Potassium: 4.4 mmol/L (ref 3.5–5.1)
Sodium: 142 mmol/L (ref 135–145)

## 2019-01-23 MED ORDER — ROSUVASTATIN CALCIUM 10 MG PO TABS: 10.0000 mg | ORAL_TABLET | Freq: Every day | ORAL | 3 refills | Status: DC

## 2019-01-23 NOTE — Patient Instructions (Signed)
Labs were done today. We will call you with any ABNORMAL results. No news is good news!  A 6 minute walk was completed today to access your ability to maintain your oxygen saturation during activity.   EKG was completed.  STOP taking Pravastatin.  BEGIN taking Crestor 10 mg (1 tab) each day.  Dr [name] would like you to complete a Peripheral Arterial Doppler study, this is done at our [facility] office. They will call to schedule an appointment with you.  Please follow up with lab work in 2 months.  Your physician recommends that you schedule a follow-up appointment in: 3 months with Dr [name].  At the [facility], you and your health needs are our priority. As part of our continuing mission to provide you with exceptional heart care, we have created designated Provider Care Teams. These Care Teams include your primary Cardiologist (physician) and Advanced Practice Providers (APPs- Physician Assistants and Nurse Practitioners) who all work together to provide you with the care you need, when you need it.   You may see any of the following providers on your designated Care Team at your next follow up: Marland Kitchen Dr Glori Bickers . Dr Loralie Champagne . Darrick Grinder, NP

## 2019-01-23 NOTE — Telephone Encounter (Signed)
Notes recorded by Kerry Dory, CMA on 01/23/2019 at 3:07 PM EDT  Pt aware and repeat labs 02/06/19  ------   Notes recorded by Larey Dresser, MD on 01/23/2019 at 10:59 AM EDT  Creatinine is a little higher. No changes today but would like to get repeat BMET in 2 wks.

## 2019-01-23 NOTE — Progress Notes (Signed)
PCP: Dr. [name] HF Cardiology: Dr. [name]  73 y.o. with history of chronic diastolic CHF, mitral regurgitation, CVA, and CKD returns for followup of CHF.   Patient was initially admitted with diastolic CHF in 5/36. Echo showed EF 60-65% with grade 3 diastolic dysfunction and moderate MR.  She had cath showing no coronary disease. 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  TEE this admission showed moderate-severe MR.  She was admitted again in 10/18 with acute on chronic diastolic CHF. Echo this admission showed EF 50-55%, severe LVH, restrictive diastolic dysfunction, severe MR, moderate-severe TR.  She was referred to Dr. [name] who saw her for mitral valve evaluation.  Upon review of studies at that time, regurgitation thought to be more in the moderate range and likely functional.   Patient had PYP amyloid scan in 12/18, this was suggestive of transthyretin amyloidosis.  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   Admission for A/C diastolic HF 1/4-4/31/5400. Required IV diuresis, then transitioned to 80 mg PO lasix BID. Metop was DC'd due to low blood pressures. Cardiac MRI strongly suggestive of amyloidosis, moderate to severe MR (moderate by regurgitant fraction 41% but severe visually). Echo repeated: EF 40-45%, moderate to severe LVH, severe MR, severe dilation of LA and RA, moderate TR, PASP 49.  She had Mitraclip x 2 placed in 5/19.  Repeat echo post-mitraclip showed EF 55-60%, moderate LVH, mean gradient 3 mmHg across the MV with trace-mild MR.   Cardiomems was placed 6/19.    Echo was done today and reviewed, EF 55% with moderate LVH, s/p Mitraclip with mean gradient 3 mmHg, mild MR, normal RV, PASP 41 mmHg.   She returns today for followup of CHF.  PADP is 14 by Cardiomems. She fatigues at times, this seems to wax and wane.  No dyspnea walking on flat ground.  No orthopnea/PND.  No chest pain.  No lightheadedness.  She does  report bilateral leg pain "all the time."  It does not seem to be worse with exertion.   ECG (personally reviewed): NSR, low voltage.   6 min walk today: [place] (10/18): K 4.4, creatinine 1.27, LDL 56, HDL 39 Labs (11/18): K 3.9, creatinine 1.43, SPEP negative Labs (2/19): K 3.8, creatinine 1.40, hgb 13.4 Labs (5/19): K 4.1, creatinine 1.4, hgb 10.8 Labs (6/19): K 4.2, creatinine 1.4, hgb 12.4 Labs (7/19): K 4, creatinine 1.38 Labs (1/20): K 3.6, creatinine 1.36 Labs (3/20): K 3.6, creatinine 1.31, LDL 107  PMH: 1. HTN 2. Hyperlipidemia 3. 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  LINQ monitor placed.  4. Chronic diastolic CHF: She has Cardiomems.  - LHC (6/17): Normal coronaries.  - Echo (10/18): EF 50-55%, severe LVH with speckled myocardium concerning for amyloidosis, restrictive diastolic function with septal flattening, severe MR, moderate-severe TR, severe biatrial enlargement, PASP 54 mmHg.  - PYP amyloid scan: Grade 3, suggestive of transthyretin amyloidosis.  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   - Echo (5/19): EF 55-60%, moderate LVH c/w amyloidosis, Mitraclip x 2 with mean MV gradient 3 mmHg, trace-mild MR, moderate  TR, PASP 62 mmHg.  - Echo (6/19): EF 55-60%, moderate LVH with speckled myocardium, trivial MR s/p Mitraclip with mean gradient 2 mmHg, PASP 61.  - Echo (6/20): EF 55% with moderate LVH, s/p Mitraclip with mean gradient 3 mmHg, mild MR, normal RV, PASP 41 mmHg.  5. Mitral regurgitation: Think mixed functional/degenerative - TEE (7/18): EF  45-50%, moderate-severe MR, mild-moderate TR.  - Echo (10/18) read as severe MR.  - 2/19 echo with severe MR.  - 2/19 cardiac MRI with moderate to severe MR.  - Mitraclip x 2 in 5/19.   - Echo 5/19 post-Mitraclip with mean MV gradient 3 mmHg, trace-mild MR.  - Echo 6/20 post-Mitraclip with mean MV gradient 3 mmHg, mild MR.  6. CKD stage 3 7. Hypothyroidism 8. H/o renal cell CA s/p nephrectomy.  9. Bilateral carpal tunnel releases.   Social History   Socioeconomic History  . Marital status: Divorced    Spouse name: Not on file  . Number of children: 5  . Years of education: 54  . Highest education level: Not on file  Occupational History  . Occupation: retired    Fish farm manager: [employer]  . Financial resource strain: Somewhat hard  . Food insecurity    Worry: Sometimes true    Inability: Sometimes true  . Transportation needs    Medical: No    Non-medical: No  Tobacco Use  . Smoking status: Never Smoker  . Smokeless tobacco: Never Used  Substance and Sexual Activity  . Alcohol use: No  . Drug use: No  . Sexual activity: Not Currently  Lifestyle  . Physical activity    Days per week: Not on file    Minutes per session: Not on file  . Stress: Not on file  Relationships  . Social Herbalist on phone: Not on file    Gets together: Not on file    Attends religious service: Not on file    Active member of club or organization: Not on file    Attends meetings of clubs or organizations: Not on file    Relationship status: Not on file  . Intimate partner violence    Fear of current or ex partner: Not on file    Emotionally abused: Not on file    Physically abused: Not on file    Forced sexual activity: Not on file  Other Topics Concern  . Not on file  Social History Narrative   Lives alone in a one story home.  Her son stays with her some.  Has 5 children, 10 grandchildren and 10 great grandchildren.  Retired Training and development officer at [employer].   Education: high school.        07/14/18- list food insecurity but is aware of food pantries and gets $15 in food stamps, reports issues with medical bills but has insurance and gets extra help through medicaid for premiums- encouraged pt to call billing to set up reasonable payment plan so it doesn't go to collections.   Family History  Problem Relation Age of Onset  . Stroke Brother   . Diabetes Mellitus II Brother   . Diabetes Mellitus II Sister   . Breast cancer Sister   . Colon cancer Sister    ROS: All systems reviewed and negative except as per HPI.   Current Outpatient Medications  Medication Sig Dispense Refill  . acetaminophen (TYLENOL) 500 MG tablet Take 1,000 mg by mouth every 6 (six) hours as needed for headache (  pain).    . amoxicillin (AMOXIL) 500 MG tablet Take 4 tablets (2,000 mg) 1 hour prior to dental procedures. 8 tablet 6  . aspirin 81 MG tablet Take 1 tablet (81 mg total) daily by mouth.    . butalbital-acetaminophen-caffeine (FIORICET, ESGIC) 50-325-40 MG tablet Take 1 tablet by mouth every 12 (twelve) hours as needed for headache. 14 tablet 0  . carvedilol (COREG) 3.125 MG tablet Take 1 tablet (3.125 mg total) by mouth 2 (two) times daily. 180 tablet 1  . clopidogrel (PLAVIX) 75 MG tablet Take 1 tablet (75 mg total) by mouth daily. 30 tablet 1  . feeding supplement, ENSURE ENLIVE, (ENSURE ENLIVE) LIQD Take 237 mLs by mouth 2 (two) times daily between meals. 237 mL 0  . furosemide (LASIX) 80 MG tablet Take 1 tablet (80 mg total) by mouth every morning AND 0.5 tablets (40 mg total) every evening. 45 tablet 6  . Hypromellose (ARTIFICIAL TEARS OP) Place 1 drop into both eyes daily as needed (dry eyes).    Marland Kitchen levothyroxine (SYNTHROID, LEVOTHROID) 25 MCG tablet Take 25 mcg by mouth daily before breakfast.    . potassium chloride SA (K-DUR,KLOR-CON) 20 MEQ tablet Take 1 tablet (20 mEq total) by mouth 2 (two) times daily. 60 tablet 0  . Tafamidis Meglumine, Cardiac,  (VYNDAQEL) 20 MG CAPS Take 80 mg by mouth daily. 120 capsule 11  . rosuvastatin (CRESTOR) 10 MG tablet Take 1 tablet (10 mg total) by mouth daily. 90 tablet 3   No current facility-administered medications for this encounter.    BP 130/80   Pulse 81   Wt 66.3 kg (146 lb 3.2 oz)   LMP 02/12/2013   SpO2 97%   BMI 25.90 kg/m  General: NAD Neck: No JVD, no thyromegaly or thyroid nodule.  Lungs: Clear to auscultation bilaterally with normal respiratory effort. CV: Nondisplaced PMI.  Heart regular S1/S2, no S3/S4, no murmur.  No peripheral edema.  No carotid bruit.  Trace PT pulses bilaterally.   Abdomen: Soft, nontender, no hepatosplenomegaly, no distention.  Skin: Intact without lesions or rashes.  Neurologic: Alert and oriented x 3.  Psych: Normal affect. Extremities: No clubbing or cyanosis.  HEENT: Normal.   Assessment/Plan: 1. Chronic diastolic CHF: Normal EF on 10/18 echo with severe LVH and speckled myocardium consistent with cardiac amyloidosis.  SPEP was negative. 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 Cardiac MRI also strongly suggestive of cardiac amyloidosis.  On exam today and by Cardiomems evaluation, she is not significantly volume overloaded.   NYHA class II with prominent fatigue.   - Continue Lasix 80 qam/40 qpm.  BMET today.  - Continue carvedilol 3.125 mg BID. 2. Mitral regurgitation: Mitral regurgitation seems to fluctuate somewhat based on loading conditions.  Most recently, echo in 2/19 suggested severe MR and cardiac MRI suggested moderate to severe MR (moderate by 41% regurgitant fraction but visually severe). I suspect that the etiology is mixed - functional as well as degenerative with thickening and restriction of posterior leaflet. She is now s/p Mitraclip procedure in 5/19. Echo today was reviewed, showed mean gradient 3 mmHg across the MV with Mitraclip in  place, MR is mild.  3. CKD: Stage III.  BMET today.  4. CVA: 7/18. Has LINQ monitor, no atrial fibrillation detected so far.  - Continue ASA 81.   - Continue Plavix. 5. 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  6. Cardiac amyloidosis: Hereditary TTR amyloidosis.  She has peripheral neuropathy as well as bilateral carpal tunnel syndrome.   - She is now on tafamidis, no side effects.   7. Leg pain: May be related to neuropathy or statin pain but PT pulses are weak.   - I will order peripheral arterial dopplers to evaluate.  8. Hyperlipidemia: Goal LDL < 70 with CVA.  LDL was 105 when last checked.  She has diffuse leg pain which could be from her pravastatin.  - I will have her stop pravastatin and start Crestor 10 mg daily with lipids/LFTs in 2 months.   Followup in 3 months.   Loralie Champagne,  01/23/2019 4:26 PM

## 2019-01-23 NOTE — Progress Notes (Signed)
2D Echocardiogram has been performed.  Katie [name] 01/23/2019, 9:06 AM

## 2019-01-23 NOTE — Progress Notes (Signed)
6 min walk  Starting O2 sat- 98% Ending O2 sat- 97%  Starting heartrate- 73 Ending heartrate- 100  How many feet walked? 1,080 How many meters walked? 329.184  Completed full 51min? yes  Comments: Test was overall tolerated. No complaints. Pt denies fatigue, dizziness, shortness of breath.

## 2019-01-25 ENCOUNTER — Other Ambulatory Visit: Payer: Self-pay

## 2019-01-25 ENCOUNTER — Ambulatory Visit (HOSPITAL_COMMUNITY)
Admission: RE | Admit: 2019-01-25 | Discharge: 2019-01-25 | Disposition: A | Discharge status: Home / Self Care | Payer: Medicare Other | Source: Ambulatory Visit | Attending: Internal Medicine | Admitting: Internal Medicine

## 2019-01-25 ENCOUNTER — Ambulatory Visit (INDEPENDENT_AMBULATORY_CARE_PROVIDER_SITE_OTHER): Payer: Medicare Other | Admitting: *Deleted

## 2019-01-25 DIAGNOSIS — I739 Peripheral vascular disease, unspecified: Secondary | ICD-10-CM | POA: Diagnosis present

## 2019-01-25 DIAGNOSIS — I639 Cerebral infarction, unspecified: Secondary | ICD-10-CM

## 2019-01-25 LAB — CUP PACEART REMOTE DEVICE CHECK
Date Time Interrogation Session: 20200625125930
Implantable Pulse Generator Implant Date: 20180720

## 2019-01-26 ENCOUNTER — Telehealth (HOSPITAL_COMMUNITY): Payer: Self-pay

## 2019-01-26 ENCOUNTER — Encounter: Payer: Self-pay | Admitting: Thoracic Surgery (Cardiothoracic Vascular Surgery)

## 2019-01-26 NOTE — Telephone Encounter (Signed)
-----   Message from Larey Dresser, MD sent at 01/25/2019  9:50 PM EDT ----- Normal, no evidence for PAD

## 2019-01-26 NOTE — Telephone Encounter (Signed)
Pt aware of results. Verbalized understanding and appreciation.

## 2019-02-01 ENCOUNTER — Telehealth (HOSPITAL_COMMUNITY): Payer: Self-pay | Admitting: Cardiology

## 2019-02-01 NOTE — Telephone Encounter (Signed)
COVID screening completed. ° °-[facility] °

## 2019-02-02 NOTE — Progress Notes (Signed)
Carelink Summary Report / Loop Recorder 

## 2019-02-06 ENCOUNTER — Ambulatory Visit (HOSPITAL_COMMUNITY)
Admission: RE | Admit: 2019-02-06 | Discharge: 2019-02-06 | Disposition: A | Discharge status: Home / Self Care | Payer: Medicare Other | Source: Ambulatory Visit | Attending: Cardiology | Admitting: Cardiology

## 2019-02-06 ENCOUNTER — Other Ambulatory Visit: Payer: Self-pay

## 2019-02-06 DIAGNOSIS — I5032 Chronic diastolic (congestive) heart failure: Secondary | ICD-10-CM | POA: Diagnosis not present

## 2019-02-06 LAB — BASIC METABOLIC PANEL
Anion gap: 9 (ref 5–15)
BUN: 20 mg/dL (ref 8–23)
CO2: 25 mmol/L (ref 22–32)
Calcium: 9.6 mg/dL (ref 8.9–10.3)
Chloride: 107 mmol/L (ref 98–111)
Creatinine, Ser: 1.48 mg/dL — ABNORMAL HIGH (ref 0.44–1.00)
GFR calc Af Amer: 40 mL/min — ABNORMAL LOW (ref >60)
GFR calc non Af Amer: 35 mL/min — ABNORMAL LOW (ref >60)
Glucose, Bld: 97 mg/dL (ref 70–99)
Potassium: 3.8 mmol/L (ref 3.5–5.1)
Sodium: 141 mmol/L (ref 135–145)

## 2019-02-06 LAB — LIPID PANEL
Cholesterol: 149 mg/dL (ref 0–200)
HDL: 55 mg/dL (ref >40)
LDL Cholesterol: 72 mg/dL (ref 0–99)
Total CHOL/HDL Ratio: 2.7 RATIO
Triglycerides: 109 mg/dL (ref <150)
VLDL: 22 mg/dL (ref 0–40)

## 2019-02-06 LAB — HEPATIC FUNCTION PANEL
ALT: 11 U/L (ref 0–44)
AST: 17 U/L (ref 15–41)
Albumin: 3.7 g/dL (ref 3.5–5.0)
Alkaline Phosphatase: 107 U/L (ref 38–126)
Bilirubin, Direct: 0.1 mg/dL (ref 0.0–0.2)
Indirect Bilirubin: 0.6 mg/dL (ref 0.3–0.9)
Total Bilirubin: 0.7 mg/dL (ref 0.3–1.2)
Total Protein: 7.7 g/dL (ref 6.5–8.1)

## 2019-02-27 ENCOUNTER — Ambulatory Visit (INDEPENDENT_AMBULATORY_CARE_PROVIDER_SITE_OTHER): Payer: Medicare Other | Admitting: *Deleted

## 2019-02-27 DIAGNOSIS — I639 Cerebral infarction, unspecified: Secondary | ICD-10-CM | POA: Diagnosis not present

## 2019-02-27 LAB — CUP PACEART REMOTE DEVICE CHECK
Date Time Interrogation Session: 20200728153609
Implantable Pulse Generator Implant Date: 20180720

## 2019-03-12 NOTE — Progress Notes (Signed)
Carelink Summary Report / Loop Recorder 

## 2019-03-23 ENCOUNTER — Other Ambulatory Visit: Payer: Self-pay

## 2019-03-23 NOTE — Patient Outreach (Signed)
[facility]) Care Management  [place]  03/23/2019   Katie [name] 1945-11-10 [id number]  Subjective: Successful outreach.  Two patient identifies given.  The patient states that she is doing good.  She denies any shortness of breath , pain, or falls.  She states that she does have some swelling in her feet after she has put her shoes on.  Advised the patient to elevate her feet.  She states that she does when she is able.  She reports that her weight today is 142 lbs.  She states she is eating 3 meals a day, and monitoring her sodium intake.  She states she walks some when it is not raining. She has an appointment 03/26/19 to have blood work done and a follow up appointment with cardiology in September.   Encounter Medications:  Outpatient Encounter Medications as of 03/23/2019  Medication Sig  . acetaminophen (TYLENOL) 500 MG tablet Take 1,000 mg by mouth every 6 (six) hours as needed for headache (pain).  Marland Kitchen amoxicillin (AMOXIL) 500 MG tablet Take 4 tablets (2,000 mg) 1 hour prior to dental procedures.  Marland Kitchen aspirin 81 MG tablet Take 1 tablet (81 mg total) daily by mouth.  . butalbital-acetaminophen-caffeine (FIORICET, ESGIC) 50-325-40 MG tablet Take 1 tablet by mouth every 12 (twelve) hours as needed for headache.  . carvedilol (COREG) 3.125 MG tablet Take 1 tablet (3.125 mg total) by mouth 2 (two) times daily.  . clopidogrel (PLAVIX) 75 MG tablet Take 1 tablet (75 mg total) by mouth daily.  . feeding supplement, ENSURE ENLIVE, (ENSURE ENLIVE) LIQD Take 237 mLs by mouth 2 (two) times daily between meals.  . furosemide (LASIX) 80 MG tablet Take 1 tablet (80 mg total) by mouth every morning AND 0.5 tablets (40 mg total) every evening.  . Hypromellose (ARTIFICIAL TEARS OP) Place 1 drop into both eyes daily as needed (dry eyes).  Marland Kitchen levothyroxine (SYNTHROID, LEVOTHROID) 25 MCG tablet Take 25 mcg by mouth daily before breakfast.  . potassium chloride SA (K-DUR,KLOR-CON) 20  MEQ tablet Take 1 tablet (20 mEq total) by mouth 2 (two) times daily.  . rosuvastatin (CRESTOR) 10 MG tablet Take 1 tablet (10 mg total) by mouth daily.  . Tafamidis Meglumine, Cardiac, (VYNDAQEL) 20 MG CAPS Take 80 mg by mouth daily.   No facility-administered encounter medications on file as of 03/23/2019.     Functional Status:  No flowsheet data found.  Fall/Depression Screening: Fall Risk  03/23/2019 12/21/2018 10/20/2018  Falls in the past year? 0 0 0  Number falls in past yr: - - 0  Injury with Fall? - - 0  Risk for fall due to : - - -  Follow up - - Falls evaluation completed   PHQ 2/9 Scores 03/07/2018 02/22/2018 12/12/2017 07/28/2017 03/03/2017 02/22/2017  PHQ - 2 Score 0 0 0 0 0 0    Assessment: Patient will continue to benefit from health coach outreach for disease management and support. THN CM Care Plan Problem One     Most Recent Value  THN Long Term Goal   in 90 days the patient will improve her health and verbalize no impatient admissions  THN Long Term Goal Start Date  03/23/19  Interventions for Problem One Long Term Goal  Discussed signs and symtom. encouraged medication adherence, encouraged continue eating 3 meals a day, encouraged elevating her feet when possible, encouraged exercise when possible, encouraged the patient to keep her appointments.     Plan: RN Health Coach  will contact patient in the month of November and patient agrees to next outreach.   Lazaro Arms RN, BSN, [place] Direct Dial:  [phone]  Fax: [phone]

## 2019-03-26 ENCOUNTER — Ambulatory Visit (HOSPITAL_COMMUNITY)
Admission: RE | Admit: 2019-03-26 | Discharge: 2019-03-26 | Disposition: A | Discharge status: Home / Self Care | Payer: Medicare Other | Source: Ambulatory Visit | Attending: Cardiology | Admitting: Cardiology

## 2019-03-26 ENCOUNTER — Other Ambulatory Visit: Payer: Self-pay

## 2019-03-26 ENCOUNTER — Telehealth (HOSPITAL_COMMUNITY): Payer: Self-pay

## 2019-03-26 ENCOUNTER — Other Ambulatory Visit (HOSPITAL_COMMUNITY): Payer: Self-pay

## 2019-03-26 DIAGNOSIS — I5032 Chronic diastolic (congestive) heart failure: Secondary | ICD-10-CM

## 2019-03-26 DIAGNOSIS — E7800 Pure hypercholesterolemia, unspecified: Secondary | ICD-10-CM

## 2019-03-26 LAB — HEPATIC FUNCTION PANEL
ALT: 14 U/L (ref 0–44)
AST: 25 U/L (ref 15–41)
Albumin: 4 g/dL (ref 3.5–5.0)
Alkaline Phosphatase: 114 U/L (ref 38–126)
Bilirubin, Direct: 0.2 mg/dL (ref 0.0–0.2)
Indirect Bilirubin: 0.6 mg/dL (ref 0.3–0.9)
Total Bilirubin: 0.8 mg/dL (ref 0.3–1.2)
Total Protein: 8.2 g/dL — ABNORMAL HIGH (ref 6.5–8.1)

## 2019-03-26 LAB — LIPID PANEL
Cholesterol: 165 mg/dL (ref 0–200)
HDL: 61 mg/dL (ref >40)
LDL Cholesterol: 86 mg/dL (ref 0–99)
Total CHOL/HDL Ratio: 2.7 RATIO
Triglycerides: 92 mg/dL (ref <150)
VLDL: 18 mg/dL (ref 0–40)

## 2019-03-26 MED ORDER — ROSUVASTATIN CALCIUM 20 MG PO TABS: 20.0000 mg | ORAL_TABLET | Freq: Every day | ORAL | 6 refills | Status: DC

## 2019-03-26 NOTE — Telephone Encounter (Signed)
-----   Message from Larey Dresser, MD sent at 03/26/2019 11:12 AM EDT ----- If she is tolerating Crestor ok, increase to 20 mg daily with lipids/LFTs in 2 months (goal LDL < 70).

## 2019-03-26 NOTE — Telephone Encounter (Signed)
Pt aware of lab results and medication change. Verbalized understanding.  Lfts/lipid in 2 months. appt card mailed.

## 2019-04-02 ENCOUNTER — Ambulatory Visit (INDEPENDENT_AMBULATORY_CARE_PROVIDER_SITE_OTHER): Payer: Medicare Other | Admitting: *Deleted

## 2019-04-02 DIAGNOSIS — I639 Cerebral infarction, unspecified: Secondary | ICD-10-CM | POA: Diagnosis not present

## 2019-04-02 LAB — CUP PACEART REMOTE DEVICE CHECK
Date Time Interrogation Session: 20200830164123
Implantable Pulse Generator Implant Date: 20180720

## 2019-04-05 ENCOUNTER — Other Ambulatory Visit: Payer: Self-pay | Admitting: General Surgery

## 2019-04-05 DIAGNOSIS — N6320 Unspecified lump in the left breast, unspecified quadrant: Secondary | ICD-10-CM

## 2019-04-10 NOTE — Progress Notes (Signed)
Carelink Summary Report / Loop Recorder 

## 2019-04-16 ENCOUNTER — Other Ambulatory Visit: Payer: Self-pay

## 2019-04-16 ENCOUNTER — Ambulatory Visit
Admission: RE | Admit: 2019-04-16 | Discharge: 2019-04-16 | Disposition: A | Discharge status: Home / Self Care | Payer: Medicare Other | Source: Ambulatory Visit | Attending: General Surgery | Admitting: General Surgery

## 2019-04-16 ENCOUNTER — Ambulatory Visit: Payer: Medicare Other

## 2019-04-16 DIAGNOSIS — N6320 Unspecified lump in the left breast, unspecified quadrant: Secondary | ICD-10-CM

## 2019-04-27 ENCOUNTER — Encounter (HOSPITAL_COMMUNITY): Payer: Medicare Other | Admitting: Cardiology

## 2019-05-04 ENCOUNTER — Ambulatory Visit (INDEPENDENT_AMBULATORY_CARE_PROVIDER_SITE_OTHER): Payer: Medicare Other | Admitting: *Deleted

## 2019-05-04 DIAGNOSIS — I639 Cerebral infarction, unspecified: Secondary | ICD-10-CM

## 2019-05-05 LAB — CUP PACEART REMOTE DEVICE CHECK
Date Time Interrogation Session: 20201002164112
Implantable Pulse Generator Implant Date: 20180720

## 2019-05-08 NOTE — Progress Notes (Signed)
Carelink Summary Report / Loop Recorder 

## 2019-05-18 ENCOUNTER — Other Ambulatory Visit (HOSPITAL_COMMUNITY): Payer: Self-pay | Admitting: Cardiology

## 2019-05-21 ENCOUNTER — Telehealth (HOSPITAL_COMMUNITY): Payer: Self-pay | Admitting: Adult Health

## 2019-05-21 NOTE — Telephone Encounter (Signed)
   Cardiomems elevated.   She reports increased leg edema.   Instructed to increase lasix to 80 mg twice a day x 2 days then back to lasix 80 mg/40mg  .   Ms Simmon verbalized understanding.   Maximilliano Kersh NP-C  3:46 PM

## 2019-05-24 ENCOUNTER — Other Ambulatory Visit: Payer: Self-pay

## 2019-05-24 ENCOUNTER — Ambulatory Visit (HOSPITAL_COMMUNITY)
Admission: RE | Admit: 2019-05-24 | Discharge: 2019-05-24 | Disposition: A | Discharge status: Home / Self Care | Payer: Medicare Other | Source: Ambulatory Visit | Attending: Internal Medicine | Admitting: Internal Medicine

## 2019-05-24 DIAGNOSIS — E7800 Pure hypercholesterolemia, unspecified: Secondary | ICD-10-CM | POA: Diagnosis present

## 2019-05-24 LAB — LIPID PANEL
Cholesterol: 162 mg/dL (ref 0–200)
HDL: 60 mg/dL (ref >40)
LDL Cholesterol: 87 mg/dL (ref 0–99)
Total CHOL/HDL Ratio: 2.7 RATIO
Triglycerides: 75 mg/dL (ref <150)
VLDL: 15 mg/dL (ref 0–40)

## 2019-05-24 LAB — HEPATIC FUNCTION PANEL
ALT: 22 U/L (ref 0–44)
AST: 40 U/L (ref 15–41)
Albumin: 4 g/dL (ref 3.5–5.0)
Alkaline Phosphatase: 107 U/L (ref 38–126)
Bilirubin, Direct: 0.1 mg/dL (ref 0.0–0.2)
Indirect Bilirubin: 0.6 mg/dL (ref 0.3–0.9)
Total Bilirubin: 0.7 mg/dL (ref 0.3–1.2)
Total Protein: 8.3 g/dL — ABNORMAL HIGH (ref 6.5–8.1)

## 2019-06-04 IMAGING — MG DIGITAL SCREENING BILATERAL MAMMOGRAM WITH TOMO AND CAD
6 of 10 series · 6 of 30 positions shown · non-contrast
Comparison: Previous exam(s).

CLINICAL DATA: Screening.

EXAM:
DIGITAL SCREENING BILATERAL MAMMOGRAM WITH TOMO AND CAD

[L CC synth-2D (1 of 2)]
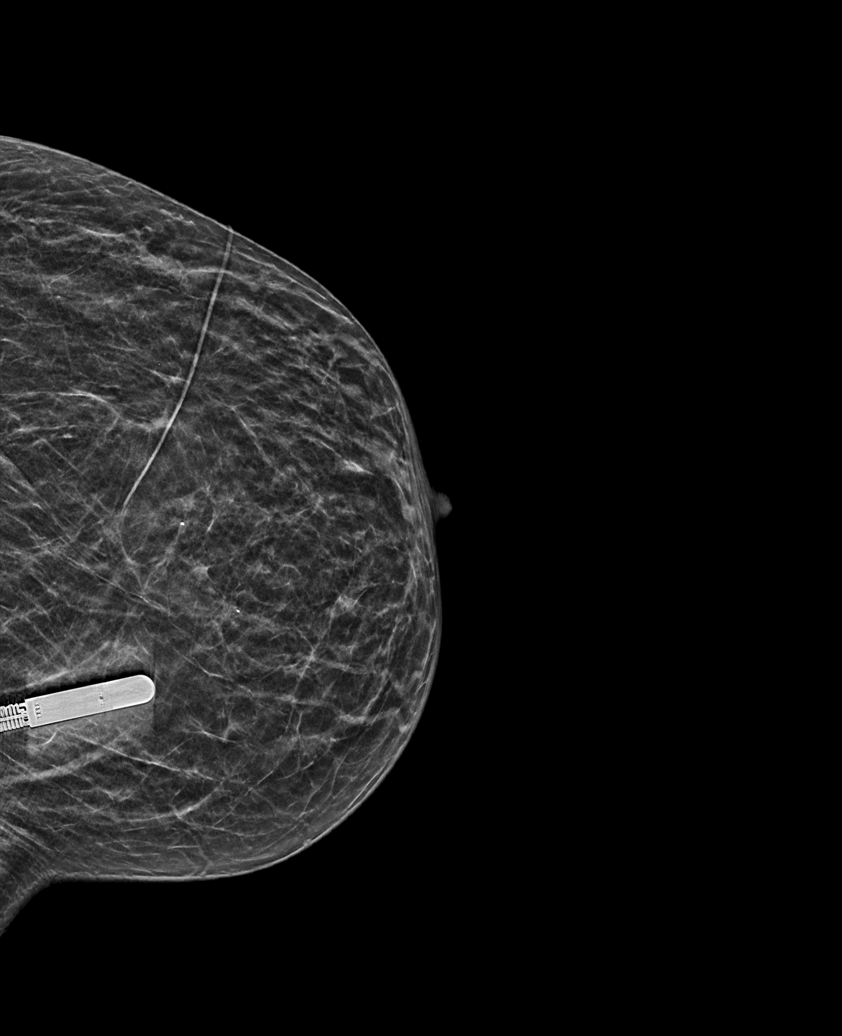

[L MLO synth-2D]
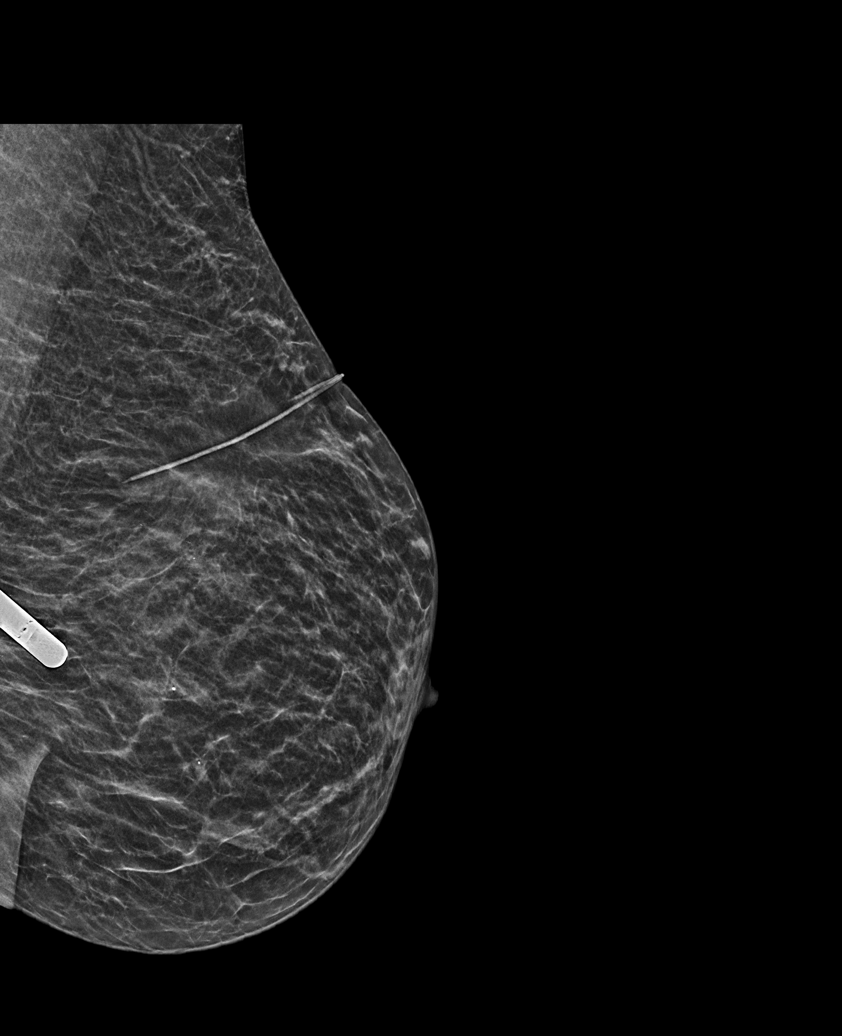

[L CC synth-2D (2 of 2)]
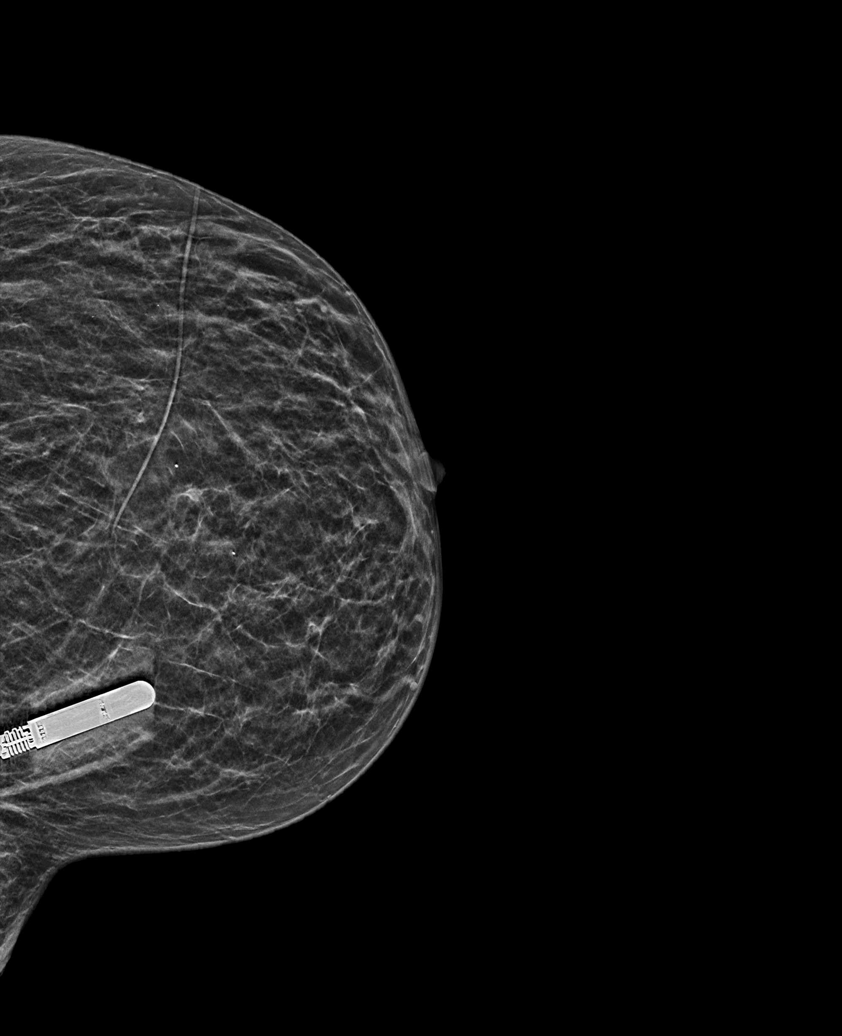

[R CC synth-2D]
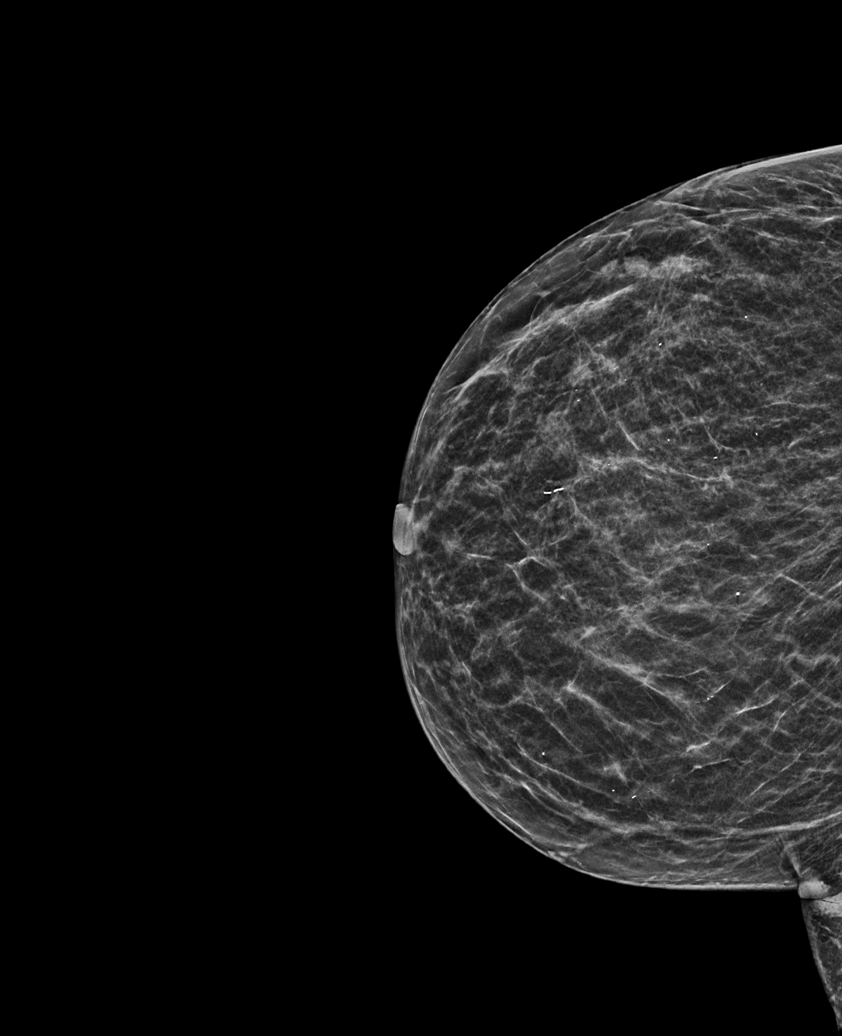

[R MLO synth-2D]
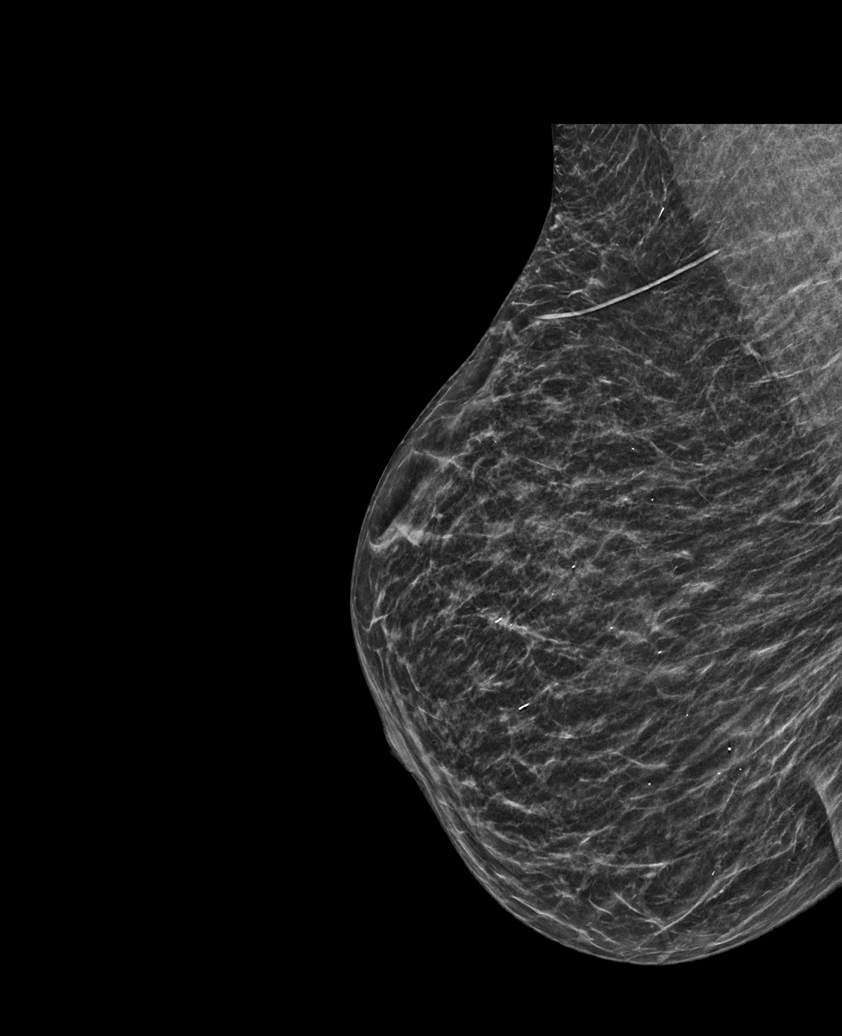

[L CC tomo · tomo slice 19/36.0]
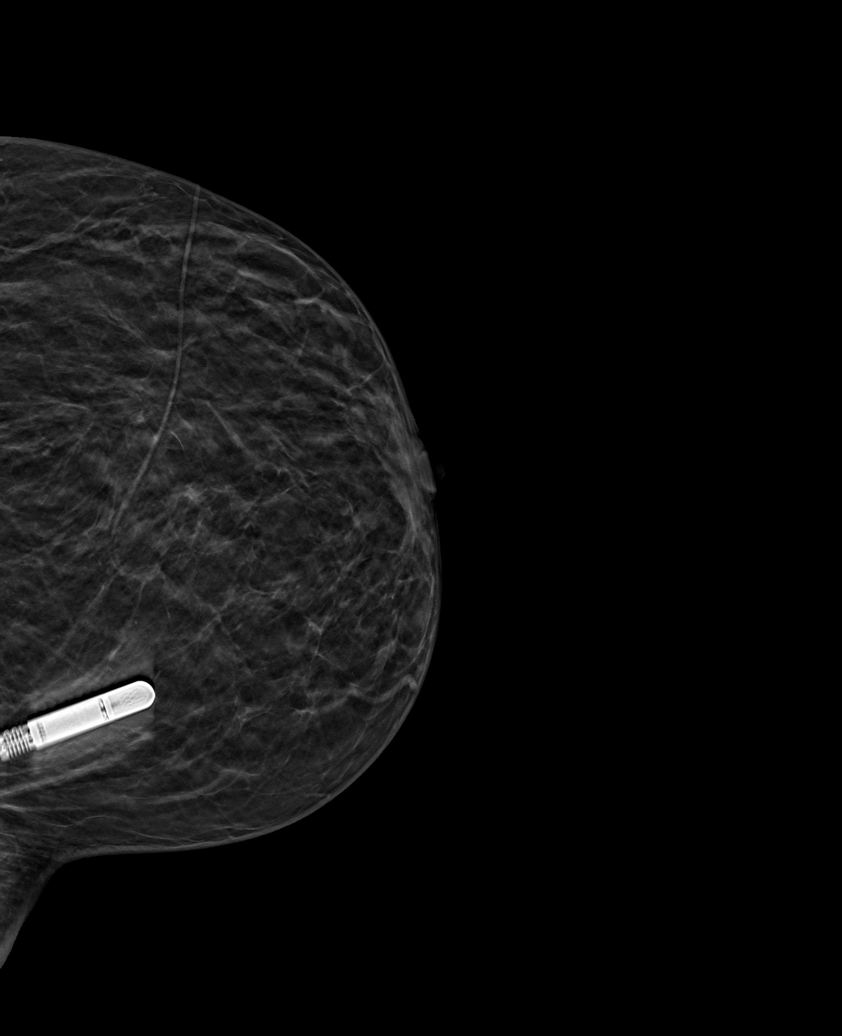

[6 of 30 positions shown; findings below may reference images not displayed]

ACR Breast Density Category b: There are scattered areas of
fibroglandular density.
FINDINGS: In the right breast, calcifications warrant further evaluation with
magnified views. In the left breast, no findings suspicious for
malignancy. Images were processed with CAD.
IMPRESSION: Further evaluation is suggested for calcifications in the right
breast.

RECOMMENDATION:
Diagnostic mammogram of the right breast. (Code:[id number])

The patient will be contacted regarding the findings, and additional
imaging will be scheduled.

BI-RADS CATEGORY  0: Incomplete. Need additional imaging evaluation
and/or prior mammograms for comparison.

## 2019-06-06 ENCOUNTER — Ambulatory Visit (INDEPENDENT_AMBULATORY_CARE_PROVIDER_SITE_OTHER): Payer: Medicare Other | Admitting: *Deleted

## 2019-06-06 DIAGNOSIS — I5032 Chronic diastolic (congestive) heart failure: Secondary | ICD-10-CM | POA: Diagnosis not present

## 2019-06-06 LAB — CUP PACEART REMOTE DEVICE CHECK
Date Time Interrogation Session: 20201104164212
Implantable Pulse Generator Implant Date: 20180720

## 2019-06-13 ENCOUNTER — Other Ambulatory Visit (HOSPITAL_COMMUNITY): Payer: Self-pay

## 2019-06-13 MED ORDER — FUROSEMIDE 80 MG PO TABS: ORAL_TABLET | ORAL | 6 refills | Status: DC

## 2019-06-15 ENCOUNTER — Encounter (HOSPITAL_COMMUNITY): Payer: Medicare Other | Admitting: Cardiology

## 2019-06-21 ENCOUNTER — Other Ambulatory Visit: Payer: Self-pay | Admitting: *Deleted

## 2019-06-21 ENCOUNTER — Encounter: Payer: Self-pay | Admitting: *Deleted

## 2019-06-21 NOTE — Patient Outreach (Signed)
[facility]) Care Management  [place]  06/21/2019   ALA KRATZ March 07, 1946 [id number]   RN Health Coach Quarterly Outreach  Referral Date:  02/11/2019 Referral Source:  Transfer from [place] Reason for Referral:  Continued Disease Management Education Insurance:  [employer]   Outreach Attempt: Successful telephone outreach to patient for follow up.  HIPAA verified with patient.  Patient reporting she is doing ok.  Denies any shortness of breath or lower extremity edema.  Monitors blood pressures and weights daily and downloads information to [employer] tablet.  Blood pressure this morning was 100/61 and weight was 143 pounds (in her normal range).  Discussed COVID and encouraged to continue to practice social distancing, hand hygiene and wearing a mask when she is out in the public, and only to go out if necessary.  Encounter Medications:  Outpatient Encounter Medications as of 06/21/2019  Medication Sig Note  . acetaminophen (TYLENOL) 500 MG tablet Take 1,000 mg by mouth every 6 (six) hours as needed for headache (pain).   Marland Kitchen amoxicillin (AMOXIL) 500 MG tablet Take 4 tablets (2,000 mg) 1 hour prior to dental procedures.   Marland Kitchen aspirin 81 MG tablet Take 1 tablet (81 mg total) daily by mouth.   . butalbital-acetaminophen-caffeine (FIORICET, ESGIC) 50-325-40 MG tablet Take 1 tablet by mouth every 12 (twelve) hours as needed for headache.   . carvedilol (COREG) 3.125 MG tablet Take 1 tablet (3.125 mg total) by mouth 2 (two) times daily.   . clopidogrel (PLAVIX) 75 MG tablet Take 1 tablet (75 mg total) by mouth daily.   . feeding supplement, ENSURE ENLIVE, (ENSURE ENLIVE) LIQD Take 237 mLs by mouth 2 (two) times daily between meals.   . furosemide (LASIX) 80 MG tablet Take 1 tablet (80 mg total) by mouth every morning AND 0.5 tablets (40 mg total) every evening.   . Hypromellose (ARTIFICIAL TEARS OP) Place 1 drop into both  eyes daily as needed (dry eyes).   Marland Kitchen levothyroxine (SYNTHROID, LEVOTHROID) 25 MCG tablet Take 25 mcg by mouth daily before breakfast.   . potassium chloride SA (K-DUR,KLOR-CON) 20 MEQ tablet Take 1 tablet (20 mEq total) by mouth 2 (two) times daily.   . rosuvastatin (CRESTOR) 20 MG tablet Take 1 tablet (20 mg total) by mouth daily. 06/21/2019: Reports taking 40 mg daily  . VYNDAQEL 20 MG CAPS TAKE 4 CAPSULES ('80MG'$ ) BY  MOUTH DAILY    No facility-administered encounter medications on file as of 06/21/2019.     Functional Status:  In your present state of health, do you have any difficulty performing the following activities: 06/21/2019  Hearing? N  Vision? N  Difficulty concentrating or making decisions? N  Walking or climbing stairs? Y  Comment some difficulties climbing stairs  Dressing or bathing? N  Doing errands, shopping? N  Preparing Food and eating ? N  Using the Toilet? N  In the past six months, have you accidently leaked urine? N  Do you have problems with loss of bowel control? N  Managing your Medications? N  Managing your Finances? N  Housekeeping or managing your Housekeeping? N  Some recent data might be hidden    Fall/Depression Screening: Fall Risk  06/21/2019 03/23/2019 12/21/2018  Falls in the past year? 0 0 0  Number falls in past yr: - - -  Injury with Fall? - - -  Risk for fall due to : Medication side effect;Impaired balance/gait - -  Follow up  Education provided;Falls prevention discussed;Falls evaluation completed - -   PHQ 2/9 Scores 03/07/2018 02/22/2018 12/12/2017 07/28/2017 03/03/2017 02/22/2017  PHQ - 2 Score 0 0 0 0 0 0   THN CM Care Plan Problem One     Most Recent Value  Care Plan Problem One  Knowledge deficiet related to self care management of heart failure  Role Documenting the Problem One  [place] for Problem One  Active  THN Long Term Goal   Patient will report no unplanned hospitalizations within the next 90 days.  THN Long  Term Goal Start Date  06/21/19  THN Long Term Goal Met Date  06/21/19  Interventions for Problem One Long Term Goal  Care plan and goals reviewed and discussed, encouraged to keep and attend scheduled medical appointments, reviewed signs and symptoms of heart failure, reviewed heart failure action plan and zones, encouraged patient to continue to monitor weights and blood pressures daily to send to [employer], reviewed medications and indications and encouraged medication compliance, encouraged to continue to review heart failure education previously mailed     Appointments:  Attended appointment with primary care provider, Dr. [name] on 05/04/2019.  Has scheduled appointment with Cardiologist on 08/07/2019.  Plan: RN Health Coach will send primary care provider quarterly update. RN Health Coach will make next telephone outreach to patient within the month of February.  [place] [phone] [email or web address]

## 2019-06-22 ENCOUNTER — Ambulatory Visit: Payer: Medicare Other

## 2019-06-22 ENCOUNTER — Other Ambulatory Visit (HOSPITAL_COMMUNITY): Payer: Self-pay

## 2019-06-22 MED ORDER — CARVEDILOL 3.125 MG PO TABS: 3.1250 mg | ORAL_TABLET | Freq: Two times a day (BID) | ORAL | 1 refills | Status: DC

## 2019-06-24 NOTE — Progress Notes (Signed)
Carelink Summary Report / Loop Recorder 

## 2019-07-05 ENCOUNTER — Telehealth (HOSPITAL_COMMUNITY): Payer: Self-pay | Admitting: Pharmacy Technician

## 2019-07-05 NOTE — Telephone Encounter (Signed)
Patient Advocate Encounter   Received notification from [facility] Medicare Part D that prior authorization for Vyndamax is required.   PA submitted on CoverMyMeds Key  [id number] Status is pending   Will continue to follow.  [name], CPhT

## 2019-07-06 NOTE — Telephone Encounter (Signed)
Advanced Heart Failure Patient Advocate Encounter  Prior Authorization for Katie [name] has been approved.    PA# [id number] Effective dates: 07/06/2019 through 08/01/2020  Patients co-pay is $0.00  [name], CPhT

## 2019-07-09 ENCOUNTER — Ambulatory Visit (INDEPENDENT_AMBULATORY_CARE_PROVIDER_SITE_OTHER): Payer: Medicare Other | Admitting: *Deleted

## 2019-07-09 DIAGNOSIS — Z8673 Personal history of transient ischemic attack (TIA), and cerebral infarction without residual deficits: Secondary | ICD-10-CM | POA: Diagnosis not present

## 2019-07-11 LAB — CUP PACEART REMOTE DEVICE CHECK
Date Time Interrogation Session: 20201208110819
Implantable Pulse Generator Implant Date: 20180720

## 2019-07-17 ENCOUNTER — Other Ambulatory Visit (HOSPITAL_COMMUNITY): Payer: Self-pay | Admitting: *Deleted

## 2019-07-23 ENCOUNTER — Telehealth (INDEPENDENT_AMBULATORY_CARE_PROVIDER_SITE_OTHER): Payer: Medicare Other | Admitting: Neurology

## 2019-07-23 ENCOUNTER — Other Ambulatory Visit: Payer: Self-pay

## 2019-07-23 ENCOUNTER — Encounter: Payer: Self-pay | Admitting: Neurology

## 2019-07-23 VITALS — Ht 63.0 in | Wt 143.0 lb

## 2019-07-23 DIAGNOSIS — G990 Autonomic neuropathy in diseases classified elsewhere: Secondary | ICD-10-CM | POA: Diagnosis not present

## 2019-07-23 DIAGNOSIS — E854 Organ-limited amyloidosis: Secondary | ICD-10-CM

## 2019-07-23 DIAGNOSIS — D472 Monoclonal gammopathy: Secondary | ICD-10-CM

## 2019-07-23 DIAGNOSIS — I639 Cerebral infarction, unspecified: Secondary | ICD-10-CM

## 2019-07-23 NOTE — Progress Notes (Signed)
    Virtual Visit via Telephone Note The purpose of this virtual visit is to provide medical care while limiting exposure to the novel coronavirus.    Consent was obtained for phone visit:  Yes.   Answered questions that patient had about telehealth interaction:  Yes.   I discussed the limitations, risks, security and privacy concerns of performing an evaluation and management service by telephone. I also discussed with the patient that there may be a patient responsible charge related to this service. The patient expressed understanding and agreed to proceed.  Pt location: Home Physician Location: office Name of referring provider:  Seward Carol, MD I connected with .Katie [name] at patients initiation/request on 07/23/2019 at  8:30 AM EST by telephone and verified that I am speaking with the correct person using two identifiers.  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  She reports doing very well with no interval hospitalizations, falls, or illnesses.  She does not have any neurological complaints, such as leg pain, hand tingling, or imbalance.  She is very compliant with her medications.  She does not take any pain medication for neuropathy as symptoms are very mild.    Assessment and Plan:   1.  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  Clinically, she denies any new neurological complaints, such as burning, numbness, or tingling of the hands and feet.  Balance is good.  No interval falls.  She remains on tafamidis which was started in December 2019 and is tolerating this well.  She will contact my office, if she develop any new neurological concerns.   2.  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  She is on aspirin 81mg , plavix 75mg , and statin therapy (LDL 87)      Follow Up Instructions:   I discussed the assessment and treatment plan with the patient. The patient was provided an opportunity to ask questions and all were answered. The patient agreed with the plan and demonstrated an understanding of the instructions.   The patient was advised to call back or seek an in-person evaluation if the symptoms worsen or if the condition fails to improve as anticipated.  Return to clinic in 1 year  Total Time spent in visit with the patient was:  8 min, of which 100% of the time was spent in counseling and/or coordinating care.   Pt understands and agrees with the plan of care outlined.     Alda Berthold, DO

## 2019-08-07 ENCOUNTER — Telehealth (HOSPITAL_COMMUNITY): Payer: Self-pay

## 2019-08-07 ENCOUNTER — Other Ambulatory Visit: Payer: Self-pay

## 2019-08-07 ENCOUNTER — Ambulatory Visit (HOSPITAL_COMMUNITY)
Admission: RE | Admit: 2019-08-07 | Discharge: 2019-08-07 | Disposition: A | Discharge status: Home / Self Care | Payer: Medicare Other | Source: Ambulatory Visit | Attending: Cardiology | Admitting: Cardiology

## 2019-08-07 ENCOUNTER — Encounter (HOSPITAL_COMMUNITY): Payer: Self-pay | Admitting: Cardiology

## 2019-08-07 VITALS — BP 120/64 | HR 64 | Wt 143.2 lb

## 2019-08-07 DIAGNOSIS — G5603 Carpal tunnel syndrome, bilateral upper limbs: Secondary | ICD-10-CM | POA: Insufficient documentation

## 2019-08-07 DIAGNOSIS — G629 Polyneuropathy, unspecified: Secondary | ICD-10-CM | POA: Diagnosis not present

## 2019-08-07 DIAGNOSIS — Z7902 Long term (current) use of antithrombotics/antiplatelets: Secondary | ICD-10-CM | POA: Insufficient documentation

## 2019-08-07 DIAGNOSIS — Z8673 Personal history of transient ischemic attack (TIA), and cerebral infarction without residual deficits: Secondary | ICD-10-CM | POA: Insufficient documentation

## 2019-08-07 DIAGNOSIS — I43 Cardiomyopathy in diseases classified elsewhere: Secondary | ICD-10-CM | POA: Diagnosis not present

## 2019-08-07 DIAGNOSIS — Z823 Family history of stroke: Secondary | ICD-10-CM | POA: Insufficient documentation

## 2019-08-07 DIAGNOSIS — E854 Organ-limited amyloidosis: Secondary | ICD-10-CM | POA: Diagnosis not present

## 2019-08-07 DIAGNOSIS — N183 Chronic kidney disease, stage 3 unspecified: Secondary | ICD-10-CM | POA: Diagnosis not present

## 2019-08-07 DIAGNOSIS — E785 Hyperlipidemia, unspecified: Secondary | ICD-10-CM | POA: Insufficient documentation

## 2019-08-07 DIAGNOSIS — E039 Hypothyroidism, unspecified: Secondary | ICD-10-CM | POA: Diagnosis not present

## 2019-08-07 DIAGNOSIS — I5032 Chronic diastolic (congestive) heart failure: Secondary | ICD-10-CM | POA: Insufficient documentation

## 2019-08-07 DIAGNOSIS — Z803 Family history of malignant neoplasm of breast: Secondary | ICD-10-CM | POA: Diagnosis not present

## 2019-08-07 DIAGNOSIS — I340 Nonrheumatic mitral (valve) insufficiency: Secondary | ICD-10-CM | POA: Diagnosis not present

## 2019-08-07 DIAGNOSIS — I130 Hypertensive heart and chronic kidney disease with heart failure and stage 1 through stage 4 chronic kidney disease, or unspecified chronic kidney disease: Secondary | ICD-10-CM | POA: Diagnosis not present

## 2019-08-07 DIAGNOSIS — Z85528 Personal history of other malignant neoplasm of kidney: Secondary | ICD-10-CM | POA: Diagnosis not present

## 2019-08-07 DIAGNOSIS — Z7982 Long term (current) use of aspirin: Secondary | ICD-10-CM | POA: Diagnosis not present

## 2019-08-07 DIAGNOSIS — Z800 Family history of malignant neoplasm of digestive organs: Secondary | ICD-10-CM | POA: Diagnosis not present

## 2019-08-07 DIAGNOSIS — Z905 Acquired absence of kidney: Secondary | ICD-10-CM | POA: Insufficient documentation

## 2019-08-07 DIAGNOSIS — Z833 Family history of diabetes mellitus: Secondary | ICD-10-CM | POA: Diagnosis not present

## 2019-08-07 DIAGNOSIS — Z79899 Other long term (current) drug therapy: Secondary | ICD-10-CM | POA: Insufficient documentation

## 2019-08-07 DIAGNOSIS — E7849 Other hyperlipidemia: Secondary | ICD-10-CM | POA: Diagnosis not present

## 2019-08-07 DIAGNOSIS — Z79890 Hormone replacement therapy (postmenopausal): Secondary | ICD-10-CM | POA: Diagnosis not present

## 2019-08-07 LAB — BASIC METABOLIC PANEL
Anion gap: 11 (ref 5–15)
BUN: 27 mg/dL — ABNORMAL HIGH (ref 8–23)
CO2: 28 mmol/L (ref 22–32)
Calcium: 9.7 mg/dL (ref 8.9–10.3)
Chloride: 103 mmol/L (ref 98–111)
Creatinine, Ser: 2.24 mg/dL — ABNORMAL HIGH (ref 0.44–1.00)
GFR calc Af Amer: 24 mL/min — ABNORMAL LOW (ref >60)
GFR calc non Af Amer: 21 mL/min — ABNORMAL LOW (ref >60)
Glucose, Bld: 98 mg/dL (ref 70–99)
Potassium: 3.6 mmol/L (ref 3.5–5.1)
Sodium: 142 mmol/L (ref 135–145)

## 2019-08-07 MED ORDER — ROSUVASTATIN CALCIUM 40 MG PO TABS: 40.0000 mg | ORAL_TABLET | Freq: Every day | ORAL | 5 refills | Status: DC

## 2019-08-07 MED ORDER — FUROSEMIDE 80 MG PO TABS: 40.0000 mg | ORAL_TABLET | Freq: Every day | ORAL | 3 refills | Status: DC

## 2019-08-07 NOTE — Telephone Encounter (Signed)
-----   Message from Larey Dresser, MD sent at 08/07/2019  4:26 PM EST ----- Creatinine much higher.  Stop Lasix for 1 day then decrease to 40 mg once daily. BMET 1 week.

## 2019-08-07 NOTE — Patient Instructions (Signed)
INCREASE Crestor to 40mg  (1 tab) daily  Labs today and repeat in 2 months We will only contact you if something comes back abnormal or we need to make some changes. Otherwise no news is good news!  Your physician recommends that you schedule a follow-up appointment in: 6 months with Dr [name]. You will get a call to schedule this appointment in the spring.  Please call office at [phone] option 2 if you have any questions or concerns.   At the [facility], you and your health needs are our priority. As part of our continuing mission to provide you with exceptional heart care, we have created designated Provider Care Teams. These Care Teams include your primary Cardiologist (physician) and Advanced Practice Providers (APPs- Physician Assistants and Nurse Practitioners) who all work together to provide you with the care you need, when you need it.   You may see any of the following providers on your designated Care Team at your next follow up: Marland Kitchen Dr Glori Bickers . Dr Loralie Champagne . Darrick Grinder, NP . Lyda Jester, PA . Audry Riles, PharmD   Please be sure to bring in all your medications bottles to every appointment.

## 2019-08-07 NOTE — Telephone Encounter (Signed)
Called and spoke to pt to review lab work and medication changes. Pt agreeable. Lab appt made.

## 2019-08-07 NOTE — Progress Notes (Signed)
PCP: Dr. [name] HF Cardiology: Dr. [name]  74 y.o. with history of chronic diastolic CHF, mitral regurgitation, CVA, and CKD returns for followup of CHF.   Patient was initially admitted with diastolic CHF in 9/62. Echo showed EF 60-65% with grade 3 diastolic dysfunction and moderate MR.  She had cath showing no coronary disease. 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  TEE this admission showed moderate-severe MR.  She was admitted again in 10/18 with acute on chronic diastolic CHF. Echo this admission showed EF 50-55%, severe LVH, restrictive diastolic dysfunction, severe MR, moderate-severe TR.  She was referred to Dr. [name] who saw her for mitral valve evaluation.  Upon review of studies at that time, regurgitation thought to be more in the moderate range and likely functional.   Patient had PYP amyloid scan in 12/18, this was suggestive of transthyretin amyloidosis.  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   Admission for A/C diastolic HF 8/3-6/62/9476. Required IV diuresis, then transitioned to 80 mg PO lasix BID. Metop was DC'd due to low blood pressures. Cardiac MRI strongly suggestive of amyloidosis, moderate to severe MR (moderate by regurgitant fraction 41% but severe visually). Echo repeated: EF 40-45%, moderate to severe LVH, severe MR, severe dilation of LA and RA, moderate TR, PASP 49.  She had Mitraclip x 2 placed in 5/19.  Repeat echo post-mitraclip showed EF 55-60%, moderate LVH, mean gradient 3 mmHg across the MV with trace-mild MR.   Cardiomems was placed 6/19.    Echo 6/20 showed EF 55% with moderate LVH, s/p Mitraclip with mean gradient 3 mmHg, mild MR, normal RV, PASP 41 mmHg.   She returns today for followup of CHF.  PADP is 12 by Cardiomems. No dyspnea walking on flat ground.  No chest pain. Still with "aches and pains" somewhat diffusely but no burning/numbness/tingling in hands/feet. No orthopnea/PND.   ECG  (personally reviewed): NSR, nonspecific T wave flattening.   Labs (10/18): K 4.4, creatinine 1.27, LDL 56, HDL 39 Labs (11/18): K 3.9, creatinine 1.43, SPEP negative Labs (2/19): K 3.8, creatinine 1.40, hgb 13.4 Labs (5/19): K 4.1, creatinine 1.4, hgb 10.8 Labs (6/19): K 4.2, creatinine 1.4, hgb 12.4 Labs (7/19): K 4, creatinine 1.38 Labs (1/20): K 3.6, creatinine 1.36 Labs (3/20): K 3.6, creatinine 1.31, LDL 107 Labs (7/20): creatinine 1.48 Labs (10/20): LDL 87  PMH: 1. HTN 2. Hyperlipidemia 3. 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  LINQ monitor placed.  4. Chronic diastolic CHF: She has Cardiomems.  - LHC (6/17): Normal coronaries.  - Echo (10/18): EF 50-55%, severe LVH with speckled myocardium concerning for amyloidosis, restrictive diastolic function with septal flattening, severe MR, moderate-severe TR, severe biatrial enlargement, PASP 54 mmHg.  - PYP amyloid scan: Grade 3, suggestive of transthyretin amyloidosis.  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   - Echo (5/19): EF 55-60%, moderate LVH c/w amyloidosis, Mitraclip x 2 with mean MV gradient 3 mmHg, trace-mild MR, moderate TR, PASP 62 mmHg.  - Echo (6/19): EF 55-60%, moderate LVH with speckled myocardium, trivial MR s/p Mitraclip with mean gradient 2  mmHg, PASP 61.  - Echo (6/20): EF 55% with moderate LVH, s/p Mitraclip with mean gradient 3 mmHg, mild MR, normal RV, PASP 41 mmHg.  5. Mitral regurgitation: Think mixed functional/degenerative - TEE (7/18): EF 45-50%, moderate-severe MR, mild-moderate TR.  - Echo  (10/18) read as severe MR.  - 2/19 echo with severe MR.  - 2/19 cardiac MRI with moderate to severe MR.  - Mitraclip x 2 in 5/19.   - Echo 5/19 post-Mitraclip with mean MV gradient 3 mmHg, trace-mild MR.  - Echo 6/20 post-Mitraclip with mean MV gradient 3 mmHg, mild MR.  6. CKD stage 3 7. Hypothyroidism 8. H/o renal cell CA s/p nephrectomy.  9. Bilateral carpal tunnel releases.  10. ABIs (6/20): Normal  Social History   Socioeconomic History  . Marital status: Divorced    Spouse name: Not on file  . Number of children: 5  . Years of education: 62  . Highest education level: Not on file  Occupational History  . Occupation: retired    Fish farm manager: [employer]  Tobacco Use  . Smoking status: Never Smoker  . Smokeless tobacco: Never Used  Substance and Sexual Activity  . Alcohol use: No  . Drug use: No  . Sexual activity: Not Currently  Other Topics Concern  . Not on file  Social History Narrative   Lives alone in a one story home.  Her son stays with her some.  Has 5 children, 10 grandchildren and 10 great grandchildren.  Retired Training and development officer at [employer].  Education: high school.        07/14/18- list food insecurity but is aware of food pantries and gets $15 in food stamps, reports issues with medical bills but has insurance and gets extra help through medicaid for premiums- encouraged pt to call billing to set up reasonable payment plan so it doesn't go to collections.   Social Determinants of Health   Financial Resource Strain:   . Difficulty of Paying Living Expenses: Not on file  Food Insecurity:   . Worried About Charity fundraiser in the Last Year: Not on file  . Ran Out of Food in the Last Year: Not on file  Transportation Needs:   . Lack of Transportation (Medical): Not on file  . Lack of Transportation (Non-Medical): Not on file  Physical Activity:   . Days of Exercise per Week: Not on file  . Minutes of Exercise per Session: Not on file  Stress:   .  Feeling of Stress : Not on file  Social Connections:   . Frequency of Communication with Friends and Family: Not on file  . Frequency of Social Gatherings with Friends and Family: Not on file  . Attends Religious Services: Not on file  . Active Member of Clubs or Organizations: Not on file  . Attends Archivist Meetings: Not on file  . Marital Status: Not on file  Intimate Partner Violence: Not At Risk  . Fear of Current or Ex-Partner: No  . Emotionally Abused: No  . Physically Abused: No  . Sexually Abused: No   Family History  Problem Relation Age of Onset  . Stroke Brother   . Diabetes Mellitus II Brother   . Diabetes Mellitus II Sister   . Breast cancer Sister   . Colon cancer Sister    ROS: All systems reviewed and negative except as per HPI.   Current Outpatient Medications  Medication Sig Dispense Refill  . acetaminophen (TYLENOL) 500 MG tablet Take  1,000 mg by mouth every 6 (six) hours as needed for headache (pain).    Marland Kitchen amoxicillin (AMOXIL) 500 MG tablet Take 4 tablets (2,000 mg) 1 hour prior to dental procedures. 8 tablet 6  . aspirin 81 MG tablet Take 1 tablet (81 mg total) daily by mouth.    . butalbital-acetaminophen-caffeine (FIORICET, ESGIC) 50-325-40 MG tablet Take 1 tablet by mouth every 12 (twelve) hours as needed for headache. 14 tablet 0  . carvedilol (COREG) 3.125 MG tablet Take 1 tablet (3.125 mg total) by mouth 2 (two) times daily. 180 tablet 1  . clopidogrel (PLAVIX) 75 MG tablet Take 1 tablet (75 mg total) by mouth daily. 30 tablet 1  . feeding supplement, ENSURE ENLIVE, (ENSURE ENLIVE) LIQD Take 237 mLs by mouth 2 (two) times daily between meals. 237 mL 0  . furosemide (LASIX) 80 MG tablet Take 1 tablet (80 mg total) by mouth every morning AND 0.5 tablets (40 mg total) every evening. 45 tablet 6  . Hypromellose (ARTIFICIAL TEARS OP) Place 1 drop into both eyes daily as needed (dry eyes).    Marland Kitchen levothyroxine (SYNTHROID, LEVOTHROID) 25 MCG tablet  Take 25 mcg by mouth daily before breakfast.    . potassium chloride SA (K-DUR,KLOR-CON) 20 MEQ tablet Take 1 tablet (20 mEq total) by mouth 2 (two) times daily. 60 tablet 0  . rosuvastatin (CRESTOR) 40 MG tablet Take 1 tablet (40 mg total) by mouth daily. 30 tablet 5  . VYNDAQEL 20 MG CAPS TAKE 4 CAPSULES (80MG ) BY  MOUTH DAILY 120 capsule 11   No current facility-administered medications for this encounter.   BP 120/64   Pulse 64   Wt 65 kg (143 lb 3.2 oz)   LMP 02/12/2013   SpO2 100%   BMI 25.37 kg/m  General: NAD Neck: No JVD, no thyromegaly or thyroid nodule.  Lungs: Clear to auscultation bilaterally with normal respiratory effort. CV: Nondisplaced PMI.  Heart regular S1/S2, no S3/S4, no murmur.  No peripheral edema.  No carotid bruit.  Normal pedal pulses.  Abdomen: Soft, nontender, no hepatosplenomegaly, no distention.  Skin: Intact without lesions or rashes.  Neurologic: Alert and oriented x 3.  Psych: Normal affect. Extremities: No clubbing or cyanosis.  HEENT: Normal.   Assessment/Plan: 1. Chronic diastolic CHF: Normal EF on 10/18 echo with severe LVH and speckled myocardium consistent with cardiac amyloidosis.  SPEP was negative. 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 Cardiac MRI also strongly suggestive of cardiac amyloidosis.  On exam today and by Cardiomems evaluation, she is not significantly volume overloaded. NYHA class II.  She does not have typical painful peripheral neuropathy symptoms, but she does have generalized "aches and pains."    - Continue Lasix 80 qam/40 qpm.  BMET today.  - Continue carvedilol 3.125 mg BID. 2. Mitral regurgitation: Mitral regurgitation seems to fluctuate somewhat based on loading conditions.  Most recently, echo in 2/19 suggested severe MR and cardiac MRI suggested moderate to severe MR (moderate by 41% regurgitant fraction but  visually severe). I suspect that the etiology is mixed - functional as well as degenerative with thickening and restriction of posterior leaflet. She is now s/p Mitraclip procedure in 5/19. Echo in 6/20 showed mean gradient 3 mmHg across the MV with Mitraclip in place, MR is mild.  3. CKD: Stage III.  BMET today.  4. CVA: 7/18. Has LINQ monitor, no atrial fibrillation detected so far.  - Continue ASA 81.   -  Continue Plavix. 5. Neuropathy: She has "aches and pains," somewhat generalized, but does not describe significant typical peripheral neuropathy symptoms today.  6. Cardiac amyloidosis: Hereditary TTR amyloidosis.  She has peripheral neuropathy as well as bilateral carpal tunnel syndrome.   - She is now on tafamidis, no side effects.   - Peripheral neuropathy symptoms are not classic.  If she has any progression, could consider addition of patisiran.  7. Hyperlipidemia: Goal LDL < 70 with CVA.  Plan to increase Crestor to 40 mg daily today with lipids/LFTs in 2 months.  Followup in 6 months.   Katie [name],  08/07/2019 1:27 PM

## 2019-08-10 ENCOUNTER — Ambulatory Visit (INDEPENDENT_AMBULATORY_CARE_PROVIDER_SITE_OTHER): Payer: Medicare Other | Admitting: *Deleted

## 2019-08-10 DIAGNOSIS — Z8673 Personal history of transient ischemic attack (TIA), and cerebral infarction without residual deficits: Secondary | ICD-10-CM | POA: Diagnosis not present

## 2019-08-13 ENCOUNTER — Telehealth (HOSPITAL_COMMUNITY): Payer: Self-pay | Admitting: Pharmacy Technician

## 2019-08-13 LAB — CUP PACEART REMOTE DEVICE CHECK
Date Time Interrogation Session: 20210110111008
Implantable Pulse Generator Implant Date: 20180720

## 2019-08-13 NOTE — Telephone Encounter (Signed)
Spoke to patient regarding Specialty Pharmacy services. Will set her up with Vyndamax to be mailed to her home. She is aware that we will not switch pharmacies until the beginning of February and that they will call her every month to set up her refill.   Will follow up with patient when we ship out her Vyndamax. Patient is aware that when we switch to [facility], that she will no longer take Vyndaqel.  [name], CPhT

## 2019-08-15 ENCOUNTER — Other Ambulatory Visit: Payer: Self-pay

## 2019-08-15 ENCOUNTER — Ambulatory Visit (HOSPITAL_COMMUNITY)
Admission: RE | Admit: 2019-08-15 | Discharge: 2019-08-15 | Disposition: A | Discharge status: Home / Self Care | Payer: Medicare Other | Source: Ambulatory Visit | Attending: Internal Medicine | Admitting: Internal Medicine

## 2019-08-15 ENCOUNTER — Telehealth (HOSPITAL_COMMUNITY): Payer: Self-pay

## 2019-08-15 DIAGNOSIS — I5032 Chronic diastolic (congestive) heart failure: Secondary | ICD-10-CM

## 2019-08-15 DIAGNOSIS — E7849 Other hyperlipidemia: Secondary | ICD-10-CM

## 2019-08-15 LAB — LIPID PANEL
Cholesterol: 142 mg/dL (ref 0–200)
HDL: 58 mg/dL (ref >40)
LDL Cholesterol: 71 mg/dL (ref 0–99)
Total CHOL/HDL Ratio: 2.4 RATIO
Triglycerides: 63 mg/dL (ref <150)
VLDL: 13 mg/dL (ref 0–40)

## 2019-08-15 LAB — BASIC METABOLIC PANEL
Anion gap: 10 (ref 5–15)
BUN: 20 mg/dL (ref 8–23)
CO2: 23 mmol/L (ref 22–32)
Calcium: 9.6 mg/dL (ref 8.9–10.3)
Chloride: 106 mmol/L (ref 98–111)
Creatinine, Ser: 2.11 mg/dL — ABNORMAL HIGH (ref 0.44–1.00)
GFR calc Af Amer: 26 mL/min — ABNORMAL LOW (ref >60)
GFR calc non Af Amer: 22 mL/min — ABNORMAL LOW (ref >60)
Glucose, Bld: 93 mg/dL (ref 70–99)
Potassium: 3.9 mmol/L (ref 3.5–5.1)
Sodium: 139 mmol/L (ref 135–145)

## 2019-08-15 LAB — HEPATIC FUNCTION PANEL
ALT: 19 U/L (ref 0–44)
AST: 33 U/L (ref 15–41)
Albumin: 3.5 g/dL (ref 3.5–5.0)
Alkaline Phosphatase: 90 U/L (ref 38–126)
Bilirubin, Direct: 0.1 mg/dL (ref 0.0–0.2)
Indirect Bilirubin: 0.6 mg/dL (ref 0.3–0.9)
Total Bilirubin: 0.7 mg/dL (ref 0.3–1.2)
Total Protein: 8.1 g/dL (ref 6.5–8.1)

## 2019-08-15 NOTE — Telephone Encounter (Signed)
-----   Message from Larey Dresser, MD sent at 08/15/2019  4:06 PM EST ----- Creatinine lower.  Repeat BMET again 1 week.

## 2019-08-22 ENCOUNTER — Telehealth (HOSPITAL_COMMUNITY): Payer: Self-pay | Admitting: Pharmacist

## 2019-08-22 ENCOUNTER — Telehealth (HOSPITAL_COMMUNITY): Payer: Self-pay

## 2019-08-22 ENCOUNTER — Other Ambulatory Visit: Payer: Self-pay

## 2019-08-22 ENCOUNTER — Ambulatory Visit (HOSPITAL_COMMUNITY)
Admission: RE | Admit: 2019-08-22 | Discharge: 2019-08-22 | Disposition: A | Discharge status: Home / Self Care | Payer: Medicare Other | Source: Ambulatory Visit | Attending: Internal Medicine | Admitting: Internal Medicine

## 2019-08-22 DIAGNOSIS — I5032 Chronic diastolic (congestive) heart failure: Secondary | ICD-10-CM

## 2019-08-22 LAB — BASIC METABOLIC PANEL
Anion gap: 8 (ref 5–15)
BUN: 21 mg/dL (ref 8–23)
CO2: 21 mmol/L — ABNORMAL LOW (ref 22–32)
Calcium: 9.9 mg/dL (ref 8.9–10.3)
Chloride: 112 mmol/L — ABNORMAL HIGH (ref 98–111)
Creatinine, Ser: 2.3 mg/dL — ABNORMAL HIGH (ref 0.44–1.00)
GFR calc Af Amer: 23 mL/min — ABNORMAL LOW (ref >60)
GFR calc non Af Amer: 20 mL/min — ABNORMAL LOW (ref >60)
Glucose, Bld: 89 mg/dL (ref 70–99)
Potassium: 4.4 mmol/L (ref 3.5–5.1)
Sodium: 141 mmol/L (ref 135–145)

## 2019-08-22 MED ORDER — VYNDAMAX 61 MG PO CAPS: 61.0000 mg | ORAL_CAPSULE | Freq: Every day | ORAL | 11 refills | Status: DC

## 2019-08-22 MED ORDER — FUROSEMIDE 20 MG PO TABS: 20.0000 mg | ORAL_TABLET | Freq: Every day | ORAL | 3 refills | Status: DC

## 2019-08-22 MED FILL — VYNDAMAX 61 MG CAPS: 61 | 30 days supply | Qty: 30 | Fill #0

## 2019-08-22 NOTE — Telephone Encounter (Signed)
Sent new prescription for Vyndamax to [facility]. Changing therapy from Vyndaqel to [place] based on insurance preference.   Audry Riles, PharmD, BCPS, BCCP, CPP Heart Failure Clinic Pharmacist [phone]

## 2019-08-22 NOTE — Telephone Encounter (Signed)
-----   Message from Larey Dresser, MD sent at 08/22/2019 12:15 PM EST ----- Decrease Lasix to 20 mg daily. BMET 1 week.

## 2019-08-22 NOTE — Telephone Encounter (Signed)
Will mail patient's Vyndamax from [place]. They will call her revery month to set up mailing and refilling of Vyndamax.  She should receive her first shipment of Vyndamax by Friday. We are going to go ahead and mail it to her to make sure there is no gap in therapy between switching from Vyndaqel to [place]. She is aware that Vyndamax is a once daily capsule and that she will no longer take the Vyndaqel once received.   [name], CPhT

## 2019-08-29 ENCOUNTER — Telehealth (HOSPITAL_COMMUNITY): Payer: Self-pay

## 2019-08-29 ENCOUNTER — Other Ambulatory Visit: Payer: Self-pay

## 2019-08-29 ENCOUNTER — Ambulatory Visit (HOSPITAL_COMMUNITY)
Admission: RE | Admit: 2019-08-29 | Discharge: 2019-08-29 | Disposition: A | Discharge status: Home / Self Care | Payer: Medicare Other | Source: Ambulatory Visit | Attending: Internal Medicine | Admitting: Internal Medicine

## 2019-08-29 DIAGNOSIS — I5032 Chronic diastolic (congestive) heart failure: Secondary | ICD-10-CM | POA: Diagnosis present

## 2019-08-29 LAB — BASIC METABOLIC PANEL
Anion gap: 9 (ref 5–15)
BUN: 20 mg/dL (ref 8–23)
CO2: 22 mmol/L (ref 22–32)
Calcium: 10 mg/dL (ref 8.9–10.3)
Chloride: 109 mmol/L (ref 98–111)
Creatinine, Ser: 2.17 mg/dL — ABNORMAL HIGH (ref 0.44–1.00)
GFR calc Af Amer: 25 mL/min — ABNORMAL LOW (ref >60)
GFR calc non Af Amer: 22 mL/min — ABNORMAL LOW (ref >60)
Glucose, Bld: 104 mg/dL — ABNORMAL HIGH (ref 70–99)
Potassium: 3.8 mmol/L (ref 3.5–5.1)
Sodium: 140 mmol/L (ref 135–145)

## 2019-08-29 NOTE — Telephone Encounter (Signed)
-----   Message from Larey Dresser, MD sent at 08/29/2019 11:59 AM EST ----- Stop Lasix, will need to follow closely via Cardiomems with no Lasix.

## 2019-08-29 NOTE — Telephone Encounter (Signed)
Pt aware of results. Advised to stop lasix.  Advised to call office if she has any concerns at home while being off lasix.

## 2019-09-03 ENCOUNTER — Other Ambulatory Visit: Payer: Self-pay | Admitting: Internal Medicine

## 2019-09-03 DIAGNOSIS — N63 Unspecified lump in unspecified breast: Secondary | ICD-10-CM

## 2019-09-04 ENCOUNTER — Encounter: Payer: Self-pay | Admitting: *Deleted

## 2019-09-04 ENCOUNTER — Other Ambulatory Visit: Payer: Self-pay | Admitting: *Deleted

## 2019-09-04 NOTE — Patient Outreach (Signed)
[facility]) Care Management  [facility] Care Manager  09/04/2019   Katie [name] Mar 17, 1946 [id number]   RN Health Coach Quarterly Outreach   Referral Date:  02/11/2019 Referral Source:  Transfer from RN Care Coordinator Reason for Referral:  Continued Disease Management Education Insurance:  [employer]   Outreach Attempt:  Successful telephone outreach to patient for follow up.  HIPAA verified with patient.  Patient reporting she is doing well.  Reports her Cardiologist has asked her to stop taking her fluid medication (lasix) due to her worsening kidney function.  Has been weighing herself daily.  Weight this morning was 146 pounds (baseline prior to holding lasix was 142-144 pounds).  Encouraged patient to contact Cardiologist if weight continues to increase.  Currently denies any shortness of breath or swelling in her extremities.  Patient unsure of when she will get repeated blood work.  Denies any recent falls or sick days.  Encounter Medications:  Outpatient Encounter Medications as of 09/04/2019  Medication Sig  . acetaminophen (TYLENOL) 500 MG tablet Take 1,000 mg by mouth every 6 (six) hours as needed for headache (pain).  Marland Kitchen aspirin 81 MG tablet Take 1 tablet (81 mg total) daily by mouth.  . butalbital-acetaminophen-caffeine (FIORICET, ESGIC) 50-325-40 MG tablet Take 1 tablet by mouth every 12 (twelve) hours as needed for headache.  . carvedilol (COREG) 3.125 MG tablet Take 1 tablet (3.125 mg total) by mouth 2 (two) times daily.  . clopidogrel (PLAVIX) 75 MG tablet Take 1 tablet (75 mg total) by mouth daily.  . feeding supplement, ENSURE ENLIVE, (ENSURE ENLIVE) LIQD Take 237 mLs by mouth 2 (two) times daily between meals.  . Hypromellose (ARTIFICIAL TEARS OP) Place 1 drop into both eyes daily as needed (dry eyes).  Marland Kitchen levothyroxine (SYNTHROID, LEVOTHROID) 25 MCG tablet Take 25 mcg by mouth daily before breakfast.  . potassium chloride SA (K-DUR,KLOR-CON)  20 MEQ tablet Take 1 tablet (20 mEq total) by mouth 2 (two) times daily.  . rosuvastatin (CRESTOR) 40 MG tablet Take 1 tablet (40 mg total) by mouth daily.  . Tafamidis (VYNDAMAX) 61 MG CAPS Take 61 mg by mouth daily.  Marland Kitchen amoxicillin (AMOXIL) 500 MG tablet Take 4 tablets (2,000 mg) 1 hour prior to dental procedures.   No facility-administered encounter medications on file as of 09/04/2019.    Functional Status:  In your present state of health, do you have any difficulty performing the following activities: 06/21/2019  Hearing? N  Vision? N  Difficulty concentrating or making decisions? N  Walking or climbing stairs? Y  Comment some difficulties climbing stairs  Dressing or bathing? N  Doing errands, shopping? N  Preparing Food and eating ? N  Using the Toilet? N  In the past six months, have you accidently leaked urine? N  Do you have problems with loss of bowel control? N  Managing your Medications? N  Managing your Finances? N  Housekeeping or managing your Housekeeping? N  Some recent data might be hidden    Fall/Depression Screening: Fall Risk  09/04/2019 07/23/2019 06/21/2019  Falls in the past year? 0 0 0  Number falls in past yr: 0 - -  Injury with Fall? 0 - -  Risk for fall due to : Medication side effect;Impaired mobility;Impaired balance/gait - Medication side effect;Impaired balance/gait  Follow up Falls evaluation completed;Education provided;Falls prevention discussed - Education provided;Falls prevention discussed;Falls evaluation completed   PHQ 2/9 Scores 09/04/2019 03/07/2018 02/22/2018 12/12/2017 07/28/2017 03/03/2017 02/22/2017  PHQ - 2  Score 0 0 0 0 0 0 0   [facility] CM Care Plan Problem One     Most Recent Value  Care Plan Problem One  Knowledge deficiet related to self care management of heart failure  Role Documenting the Problem One  [place] for Problem One  Active  [facility] Long Term Goal   Patient will report no emergency room visits or hospitalizations  within the next 90 days  [facility] Long Term Goal Start Date  09/04/19  [facility] Long Term Goal Met Date  09/04/19  Interventions for Problem One Long Term Goal  Reviewed and discussed care plan and goals, encouraged patient to continue to weigh herself daily and to notify provider if weight increased to about 150 pounds while not taking fluid medication, encouraged patient to discuss fluid medication on hold and continued taking potassium pills with nephrologist, reviwed signs and symptoms of heart failure and discussed with patient when to notify provider for symptoms, reviewed heart failure zones and action plan, sending 2021 Calendar booklet to assist with keeping appointments and documenting vitals, reviewed medications and encouraged medication compliance, encouraged to keep and attend scheduled medical appointments     Appointments:   Last attended appointment with primary care provider, Dr. [name] on 08/24/2019 and has follow up scheduled for March per patient.  Last attended appointment with Cardiologist, Dr. [name] on 08/07/2019 and report follow up is not for another 6 months.  Patient states she does have appointment with Nephrologist, Dr. Hollie Salk on 09/12/2019.  Plan: RN Health Coach will send primary care provider quarterly update. RN Health Coach will send patient 2021 Calendar Booklet. RN Health Coach will make next telephone outreach to patient within the month of March and patient agrees to future outreach.  [place] [phone] [name]

## 2019-09-10 ENCOUNTER — Ambulatory Visit (INDEPENDENT_AMBULATORY_CARE_PROVIDER_SITE_OTHER): Payer: Medicare Other | Admitting: *Deleted

## 2019-09-10 DIAGNOSIS — Z8673 Personal history of transient ischemic attack (TIA), and cerebral infarction without residual deficits: Secondary | ICD-10-CM | POA: Diagnosis not present

## 2019-09-10 LAB — CUP PACEART REMOTE DEVICE CHECK
Date Time Interrogation Session: 20210207233248
Implantable Pulse Generator Implant Date: 20180720

## 2019-09-10 NOTE — Progress Notes (Signed)
ILR Remote 

## 2019-09-17 MED FILL — VYNDAMAX 61 MG CAPS: 61 | 30 days supply | Qty: 30 | Fill #1

## 2019-09-28 ENCOUNTER — Ambulatory Visit: Payer: Medicare Other | Attending: Internal Medicine

## 2019-09-28 DIAGNOSIS — Z23 Encounter for immunization: Secondary | ICD-10-CM | POA: Insufficient documentation

## 2019-09-28 NOTE — Progress Notes (Signed)
   Covid-19 Vaccination Clinic  Name:  HAILEIGH PITZ    MRN: [id number] DOB: 28-Nov-1945  09/28/2019  Ms. Rochford was observed post Covid-19 immunization for 15 minutes without incidence. She was provided with Vaccine Information Sheet and instruction to access the V-Safe system.   Ms. Golembeski was instructed to call 911 with any severe reactions post vaccine: Marland Kitchen Difficulty breathing  . Swelling of your face and throat  . A fast heartbeat  . A bad rash all over your body  . Dizziness and weakness    Immunizations Administered    Name Date Dose VIS Date Route   Pfizer COVID-19 Vaccine 09/28/2019 12:47 PM 0.3 mL 07/13/2019 Intramuscular   Manufacturer: [place]   Lot: [id number]   [place]: [id number]

## 2019-10-08 ENCOUNTER — Other Ambulatory Visit: Payer: Self-pay

## 2019-10-08 ENCOUNTER — Ambulatory Visit (HOSPITAL_COMMUNITY)
Admission: RE | Admit: 2019-10-08 | Discharge: 2019-10-08 | Disposition: A | Discharge status: Home / Self Care | Payer: Medicare Other | Source: Ambulatory Visit | Attending: Cardiology | Admitting: Cardiology

## 2019-10-08 ENCOUNTER — Other Ambulatory Visit (HOSPITAL_COMMUNITY): Payer: Self-pay

## 2019-10-08 DIAGNOSIS — I5032 Chronic diastolic (congestive) heart failure: Secondary | ICD-10-CM | POA: Diagnosis present

## 2019-10-08 DIAGNOSIS — E854 Organ-limited amyloidosis: Secondary | ICD-10-CM

## 2019-10-08 DIAGNOSIS — I43 Cardiomyopathy in diseases classified elsewhere: Secondary | ICD-10-CM

## 2019-10-08 LAB — LIPID PANEL
Cholesterol: 140 mg/dL (ref 0–200)
HDL: 56 mg/dL (ref >40)
LDL Cholesterol: 68 mg/dL (ref 0–99)
Total CHOL/HDL Ratio: 2.5 RATIO
Triglycerides: 82 mg/dL (ref <150)
VLDL: 16 mg/dL (ref 0–40)

## 2019-10-08 LAB — HEPATIC FUNCTION PANEL
ALT: 22 U/L (ref 0–44)
AST: 34 U/L (ref 15–41)
Albumin: 3.7 g/dL (ref 3.5–5.0)
Alkaline Phosphatase: 95 U/L (ref 38–126)
Bilirubin, Direct: 0.2 mg/dL (ref 0.0–0.2)
Indirect Bilirubin: 0.5 mg/dL (ref 0.3–0.9)
Total Bilirubin: 0.7 mg/dL (ref 0.3–1.2)
Total Protein: 8.2 g/dL — ABNORMAL HIGH (ref 6.5–8.1)

## 2019-10-11 ENCOUNTER — Ambulatory Visit (INDEPENDENT_AMBULATORY_CARE_PROVIDER_SITE_OTHER): Payer: Medicare Other | Admitting: *Deleted

## 2019-10-11 DIAGNOSIS — Z8673 Personal history of transient ischemic attack (TIA), and cerebral infarction without residual deficits: Secondary | ICD-10-CM | POA: Diagnosis not present

## 2019-10-11 LAB — CUP PACEART REMOTE DEVICE CHECK
Date Time Interrogation Session: 20210310235805
Implantable Pulse Generator Implant Date: 20180720

## 2019-10-11 MED FILL — VYNDAMAX 61 MG CAPS: 61 | 30 days supply | Qty: 30 | Fill #2

## 2019-10-11 NOTE — Progress Notes (Signed)
ILR Remote 

## 2019-10-19 ENCOUNTER — Other Ambulatory Visit: Payer: Self-pay | Admitting: *Deleted

## 2019-10-19 NOTE — Patient Outreach (Signed)
[place] [facility]) Care Management  10/19/2019  Katie [name] 1946-04-23 [id number]   [place] Quarterly Outreach  Referral Date: 02/11/2019 Referral Source: Transfer from [place] Reason for Referral: Continued Disease Management Education Insurance:[employer] Medicare   Outreach Attempt:  Outreach attempt #1 to patient for follow up. No answer. RN Health Coach left HIPAA compliant voicemail message along with contact information.  Plan:  RN Health Coach will make another outreach attempt within the month of April if no return call back from patient.  [place] [phone] [email or web address]

## 2019-10-23 ENCOUNTER — Ambulatory Visit: Payer: Medicare Other | Attending: Internal Medicine

## 2019-10-23 DIAGNOSIS — Z23 Encounter for immunization: Secondary | ICD-10-CM

## 2019-10-23 NOTE — Progress Notes (Signed)
   Covid-19 Vaccination Clinic  Name:  DENNI FRANCE    MRN: [id number] DOB: Mar 25, 1946  10/23/2019  Ms. Steinruck was observed post Covid-19 immunization for 15 minutes without incident. She was provided with Vaccine Information Sheet and instruction to access the V-Safe system.   Ms. Mendel was instructed to call 911 with any severe reactions post vaccine: Marland Kitchen Difficulty breathing  . Swelling of face and throat  . A fast heartbeat  . A bad rash all over body  . Dizziness and weakness   Immunizations Administered    Name Date Dose VIS Date Route   Pfizer COVID-19 Vaccine 10/23/2019  3:36 PM 0.3 mL 07/13/2019 Intramuscular   Manufacturer: [place]   Lot: [id number]   [place]: [address]-8

## 2019-10-29 ENCOUNTER — Encounter: Payer: Self-pay | Admitting: *Deleted

## 2019-10-29 ENCOUNTER — Other Ambulatory Visit: Payer: Self-pay | Admitting: *Deleted

## 2019-10-29 NOTE — Patient Outreach (Signed)
[place] [facility]) Care Management  10/29/2019  Katie [name] Jul 20, 1946 [id number]   [place] Quarterly Outreach  Referral Date: 02/11/2019 Referral Source: Transfer from [place] Reason for Referral: Continued Disease Management Education Insurance:[employer] Medicare   Outreach Attempt:  Successful telephone outreach to patient for follow up.  HIPAA verified with patient.  Patient reporting receiving both doses of COVID vaccine with some tiredness after the second dose that resolved after a few days.  Does report nephrologist has resumed her Lasix at a reduced dose of 20 mg three times a week.  Patient reporting she is currently taking the Lasix 20 mg on Sunday, Monday, and Tuesdays.  Continues to weigh daily.  Weight this morning was 144 pounds (normal range).  Denies any shortness of breath or lower extremity swelling.  Discussed with patient spreading Lasix throughout the week like Monday/Wednesday/Friday or Tuesday/Thursday/Saturday. Stated her understanding.  Appointments:  States she has appointment with primary care provider, Dr. [name] on 11/06/19.  Plan:  RN Health Coach will make next telephone outreach to patient within the month of May and patient agrees to future outreach.   [place] [phone] Nil [email or web address]

## 2019-11-01 ENCOUNTER — Other Ambulatory Visit (HOSPITAL_COMMUNITY): Payer: Self-pay | Admitting: Interventional Radiology

## 2019-11-01 DIAGNOSIS — I671 Cerebral aneurysm, nonruptured: Secondary | ICD-10-CM

## 2019-11-01 DIAGNOSIS — I639 Cerebral infarction, unspecified: Secondary | ICD-10-CM

## 2019-11-05 ENCOUNTER — Ambulatory Visit (HOSPITAL_COMMUNITY): Admission: RE | Admit: 2019-11-05 | Payer: Medicare Other | Source: Ambulatory Visit

## 2019-11-05 ENCOUNTER — Other Ambulatory Visit: Payer: Self-pay

## 2019-11-05 ENCOUNTER — Encounter (HOSPITAL_COMMUNITY): Payer: Self-pay

## 2019-11-12 ENCOUNTER — Ambulatory Visit (INDEPENDENT_AMBULATORY_CARE_PROVIDER_SITE_OTHER): Payer: Medicare Other | Admitting: *Deleted

## 2019-11-12 DIAGNOSIS — Z8673 Personal history of transient ischemic attack (TIA), and cerebral infarction without residual deficits: Secondary | ICD-10-CM | POA: Diagnosis not present

## 2019-11-12 LAB — CUP PACEART REMOTE DEVICE CHECK
Date Time Interrogation Session: 20210411024900
Implantable Pulse Generator Implant Date: 20180720

## 2019-11-13 NOTE — Progress Notes (Signed)
ILR Remote 

## 2019-11-16 MED FILL — VYNDAMAX 61 MG CAPS: 61 | 30 days supply | Qty: 30 | Fill #3

## 2019-12-03 ENCOUNTER — Other Ambulatory Visit: Payer: Self-pay

## 2019-12-03 ENCOUNTER — Ambulatory Visit (HOSPITAL_COMMUNITY)
Admission: RE | Admit: 2019-12-03 | Discharge: 2019-12-03 | Disposition: A | Discharge status: Home / Self Care | Payer: Medicare Other | Source: Ambulatory Visit | Attending: Interventional Radiology | Admitting: Interventional Radiology

## 2019-12-03 DIAGNOSIS — I639 Cerebral infarction, unspecified: Secondary | ICD-10-CM

## 2019-12-03 DIAGNOSIS — I5032 Chronic diastolic (congestive) heart failure: Secondary | ICD-10-CM

## 2019-12-03 DIAGNOSIS — I671 Cerebral aneurysm, nonruptured: Secondary | ICD-10-CM | POA: Insufficient documentation

## 2019-12-03 NOTE — Progress Notes (Signed)
       Cardiomems Update   Implant Date 01/05/2018    RHC RA mean 2 RV 27/1 PA 28/5, mean 14 PCWP mean 7 Oxygen saturations: PA 71% AO 99% Cardiac Output (Fick) 4.73  Cardiac Index (Fick) 2.82      Labs 08/29/19  Creatinine 2.17 BUN 20  Potassium 3.8   PAD Goal 19  Current PAD 21        Recommendations  I have reviewed the patients PA monitoring at least weekly to bring PA pressures within optimal range.   Check BMET   Dauntae Derusha NP-C  4:16 PM

## 2019-12-04 ENCOUNTER — Ambulatory Visit
Admission: RE | Admit: 2019-12-04 | Discharge: 2019-12-04 | Disposition: A | Discharge status: Home / Self Care | Payer: Medicare Other | Source: Ambulatory Visit | Attending: Internal Medicine | Admitting: Internal Medicine

## 2019-12-04 ENCOUNTER — Telehealth (HOSPITAL_COMMUNITY): Payer: Self-pay

## 2019-12-04 ENCOUNTER — Other Ambulatory Visit: Payer: Self-pay | Admitting: Internal Medicine

## 2019-12-04 DIAGNOSIS — N63 Unspecified lump in unspecified breast: Secondary | ICD-10-CM

## 2019-12-04 NOTE — Telephone Encounter (Signed)
Called pt to inform her that per Dr. Estanislado Pandy, she no longer has to continue f/u's. She should just continue regular f/u's with her PCP and neurology. She was very happy about this news. She will continue to f/u with the other two physicians. AW

## 2019-12-07 ENCOUNTER — Other Ambulatory Visit (HOSPITAL_COMMUNITY): Payer: Self-pay

## 2019-12-07 MED ORDER — CARVEDILOL 3.125 MG PO TABS: 3.1250 mg | ORAL_TABLET | Freq: Two times a day (BID) | ORAL | 3 refills | Status: DC

## 2019-12-10 ENCOUNTER — Other Ambulatory Visit: Payer: Self-pay | Admitting: *Deleted

## 2019-12-10 ENCOUNTER — Encounter: Payer: Self-pay | Admitting: *Deleted

## 2019-12-10 NOTE — Patient Outreach (Signed)
[place] [facility]) Care Management  12/10/2019  Katie [name] Apr 28, 1946 [id number]   [place] Quarterly Outreach  Referral Date: 02/11/2019 Referral Source: Transfer from [place] Reason for Referral: Continued Disease Management Education Insurance:[employer] Medicare    Outreach Attempt:  Successful telephone outreach to patient for follow up. Patient reporting she has pinched nerve in neck and that pain radiates down to her right arm.  States pain is controlled with tylenol and pain cream.  Denies any recent falls or sick days.  Reports ankle swelling during the day that resolves at night when feet elevated.  Continues to weigh daily.  Weight this morning was 144 pounds (within normal range).  Denies any shortness of breath.  Does report compliance with Lasix every other day as prescribed.  Appointments:  Attended appointment with primary care provider, Dr. [name] on 11/06/2019 and has scheduled follow up on 04/04/2020.  Plan: RN Health Coach will make next telephone outreach to patient within the month of August and patient agrees to future outreach.  [place] [phone] [email or web address]

## 2019-12-15 LAB — CUP PACEART REMOTE DEVICE CHECK
Date Time Interrogation Session: 20210512025324
Implantable Pulse Generator Implant Date: 20180720

## 2019-12-17 ENCOUNTER — Ambulatory Visit (INDEPENDENT_AMBULATORY_CARE_PROVIDER_SITE_OTHER): Payer: Medicare Other | Admitting: *Deleted

## 2019-12-17 DIAGNOSIS — I639 Cerebral infarction, unspecified: Secondary | ICD-10-CM | POA: Diagnosis not present

## 2019-12-17 NOTE — Progress Notes (Signed)
Carelink Summary Report / Loop Recorder 

## 2019-12-18 MED FILL — VYNDAMAX 61 MG CAPS: 61 | 30 days supply | Qty: 30 | Fill #4

## 2020-01-07 ENCOUNTER — Ambulatory Visit (HOSPITAL_COMMUNITY)
Admission: RE | Admit: 2020-01-07 | Discharge: 2020-01-07 | Disposition: A | Discharge status: Home / Self Care | Payer: Medicare Other | Source: Ambulatory Visit | Attending: Adult Health | Admitting: Adult Health

## 2020-01-07 ENCOUNTER — Other Ambulatory Visit: Payer: Self-pay

## 2020-01-07 DIAGNOSIS — I5032 Chronic diastolic (congestive) heart failure: Secondary | ICD-10-CM

## 2020-01-07 NOTE — Progress Notes (Signed)
Goal 19 mm hg  PAD Reading 15 mm Hg today.   I have reviewed current PAD from cardiomems device. PAD reading stable. .  Continue current diuretic regimen.   Follow monthly readings.   Sendy Pluta  NP-C  5:31 PM

## 2020-01-09 ENCOUNTER — Other Ambulatory Visit (HOSPITAL_COMMUNITY): Payer: Self-pay

## 2020-01-09 MED ORDER — ROSUVASTATIN CALCIUM 40 MG PO TABS: 40.0000 mg | ORAL_TABLET | Freq: Every day | ORAL | 3 refills | Status: DC

## 2020-01-09 NOTE — Telephone Encounter (Signed)
Meds ordered this encounter  Medications  . rosuvastatin (CRESTOR) 40 MG tablet    Sig: Take 1 tablet (40 mg total) by mouth daily.    Dispense:  90 tablet    Refill:  3    Please cancel all previous orders for current medication. Change in dosage or pill size.

## 2020-01-14 MED FILL — VYNDAMAX 61 MG CAPS: 61 | 30 days supply | Qty: 30 | Fill #5

## 2020-01-21 ENCOUNTER — Ambulatory Visit (INDEPENDENT_AMBULATORY_CARE_PROVIDER_SITE_OTHER): Payer: Medicare Other | Admitting: *Deleted

## 2020-01-21 DIAGNOSIS — I639 Cerebral infarction, unspecified: Secondary | ICD-10-CM | POA: Diagnosis not present

## 2020-01-21 LAB — CUP PACEART REMOTE DEVICE CHECK
Date Time Interrogation Session: 20210621005449
Implantable Pulse Generator Implant Date: 20180720

## 2020-01-21 NOTE — Progress Notes (Signed)
Carelink Summary Report / Loop Recorder 

## 2020-01-28 ENCOUNTER — Encounter (HOSPITAL_COMMUNITY): Payer: Medicare Other

## 2020-02-06 ENCOUNTER — Encounter (HOSPITAL_COMMUNITY): Payer: Self-pay

## 2020-02-06 ENCOUNTER — Telehealth (HOSPITAL_COMMUNITY): Payer: Self-pay | Admitting: *Deleted

## 2020-02-06 ENCOUNTER — Ambulatory Visit (HOSPITAL_COMMUNITY)
Admission: RE | Admit: 2020-02-06 | Discharge: 2020-02-06 | Disposition: A | Discharge status: Home / Self Care | Payer: Medicare Other | Source: Ambulatory Visit | Attending: Cardiology | Admitting: Cardiology

## 2020-02-06 ENCOUNTER — Other Ambulatory Visit: Payer: Self-pay

## 2020-02-06 VITALS — BP 140/78 | HR 99 | Wt 146.2 lb

## 2020-02-06 DIAGNOSIS — R001 Bradycardia, unspecified: Secondary | ICD-10-CM | POA: Diagnosis present

## 2020-02-06 DIAGNOSIS — I495 Sick sinus syndrome: Secondary | ICD-10-CM

## 2020-02-06 NOTE — Progress Notes (Signed)
Darrick Grinder NP reviewed EKG and spoke with pt. Per Amy CLegg NP stop carvedilol for now and place Zio monitor for 7 days to eval for bradycardia.  Zio monitor placed, all instructions reviewed with pt, pt tolerated well and understood.

## 2020-02-06 NOTE — Telephone Encounter (Signed)
Per Darrick Grinder, NP pt's cardiomems report shows her HR has been running in the mid 30's the past couple of days. Amy called pt and she stated she checks her BP and HR at home and has noticed HR 36 recently. Pt will come to the office now for an EKG.

## 2020-02-06 NOTE — Patient Instructions (Signed)
Stop Carvedilol  Your provider has recommended that  you wear a Zio Patch for 7 days.  This monitor will record your heart rhythm for our review.  IF you have any symptoms while wearing the monitor please press the button.  If you have any issues with the patch or you notice a red or orange light on it please call the company at [phone].  Once you remove the patch please mail it back to the company as soon as possible so we can get the results.

## 2020-02-14 MED FILL — VYNDAMAX 61 MG CAPS: 61 | 30 days supply | Qty: 30 | Fill #6

## 2020-02-25 ENCOUNTER — Ambulatory Visit (INDEPENDENT_AMBULATORY_CARE_PROVIDER_SITE_OTHER): Payer: Medicare Other | Admitting: *Deleted

## 2020-02-25 DIAGNOSIS — I739 Peripheral vascular disease, unspecified: Secondary | ICD-10-CM

## 2020-02-25 LAB — CUP PACEART REMOTE DEVICE CHECK
Date Time Interrogation Session: 20210725231527
Implantable Pulse Generator Implant Date: 20180720

## 2020-02-26 NOTE — Progress Notes (Signed)
Carelink Summary Report / Loop Recorder 

## 2020-02-29 NOTE — Addendum Note (Signed)
Encounter addended by: Micki Riley, RN on: 02/29/2020 3:04 PM  Actions taken: Imaging Exam ended

## 2020-03-01 ENCOUNTER — Other Ambulatory Visit (HOSPITAL_COMMUNITY): Payer: Self-pay | Admitting: Cardiology

## 2020-03-03 ENCOUNTER — Telehealth (HOSPITAL_COMMUNITY): Payer: Self-pay

## 2020-03-03 MED ORDER — CARVEDILOL 3.125 MG PO TABS
3.1250 mg | ORAL_TABLET | Freq: Two times a day (BID) | ORAL | 3 refills | Status: DC
Start: 2020-03-03 — End: 2021-05-25

## 2020-03-03 NOTE — Telephone Encounter (Signed)
Patient advised and verbalized understanding. New rx sent into pharmacy   Meds ordered this encounter  Medications  . carvedilol (COREG) 3.125 MG tablet    Sig: Take 1 tablet (3.125 mg total) by mouth 2 (two) times daily with a meal.    Dispense:  90 tablet    Refill:  3

## 2020-03-03 NOTE — Telephone Encounter (Signed)
-----   Message from Larey Dresser, MD sent at 02/29/2020  3:39 PM EDT ----- With these findings, needs to restart Coreg 3.125 mg bid. There was no significant bradycardia, lowest HR was 58 with average 74.

## 2020-03-04 ENCOUNTER — Other Ambulatory Visit: Payer: Self-pay | Admitting: *Deleted

## 2020-03-04 NOTE — Patient Outreach (Signed)
[place] [facility]) Care Management  03/04/2020  SHEYLIN SCHARNHORST 02-07-1946 [id number]   [place] Quarterly Outreach  Referral Date: 02/11/2019 Referral Source: Transfer from [place] Reason for Referral: Continued Disease Management Education Insurance:[employer] Medicare   Outreach Attempt:  Outreach attempt #1 to patient for follow up.  Patient answered and was driving; unable to speak at this time.   Plan:  RN Health Coach will make another outreach attempt within the month of September.   [place] [phone] [email or web address]

## 2020-03-17 ENCOUNTER — Encounter (HOSPITAL_COMMUNITY): Payer: Medicare Other

## 2020-03-17 MED FILL — VYNDAMAX 61 MG CAPS: 61 | 30 days supply | Qty: 30 | Fill #7

## 2020-03-23 NOTE — Progress Notes (Signed)
PCP: Dr. [name] HF Cardiology: Dr. [name]  74 y.o. with history of chronic diastolic CHF, mitral regurgitation, CVA, and CKD returns for followup of CHF.   Patient was initially admitted with diastolic CHF in 1/82. Echo showed EF 60-65% with grade 3 diastolic dysfunction and moderate MR.  She had cath showing no coronary disease. 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  TEE this admission showed moderate-severe MR.  She was admitted again in 10/18 with acute on chronic diastolic CHF. Echo this admission showed EF 50-55%, severe LVH, restrictive diastolic dysfunction, severe MR, moderate-severe TR.  She was referred to Dr. [name] who saw her recently for mitral valve evaluation.  Upon review of studies, regurgitation thought to be more in the moderate range and likely functional.   Patient had PYP amyloid scan in 12/18, this was suggestive of transthyretin amyloidosis.  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   Admission for A/C diastolic HF 9/9-3/71/6967. Required IV diuresis, then transitioned to 80 mg PO lasix BID. Metop was DC'd due to low blood pressures. Cardiac MRI strongly suggestive of amyloidosis, moderate to severe MR (moderate by regurgitant fraction 41% but severe visually). Echo repeated: EF 40-45%, moderate to severe LVH, severe MR, severe dilation of LA and RA, moderate TR, PASP 49.  She had Mitraclip x 2 placed in 5/19.  Repeat echo post-mitraclip showed EF 55-60%, moderate LVH, mean gradient 3 mmHg across the MV with trace-mild MR.   Cardiomems was placed 6/19.    Had Zio Patch placed 01/2020. Had 3 runs NSVT, 5.1% PVCs.   Today she returns for HF follow up.Overall feeling fine. Mild shortness of breath with exertion. Denies PND/Orthopnea. Has low blood pressure on occasion. HEart rate at home 50-60s  No bleeding issues.  Appetite ok. No fever or chills. Weight at home 142-144  pounds. Taking all medications.  Cardiomems  Goal 19 . Todays reading is 17.   Labs (10/18): K 4.4, creatinine 1.27, LDL 56, HDL 39 Labs (11/18): K 3.9, creatinine 1.43, SPEP negative Labs (2/19): K 3.8, creatinine 1.40, hgb 13.4 Labs (5/19): K 4.1, creatinine 1.4, hgb 10.8 Labs (6/19): K 4.2, creatinine 1.4, hgb 12.4 Labs (7/19): K 4, creatinine 1.38 Labs (1/20): K 3.6, creatinine 1.36 Labs (08/2019): K 3.8 Creatinine 2.17   PMH: 1. HTN 2. Hyperlipidemia 3. 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  LINQ monitor placed.  4. Chronic diastolic CHF: She has Cardiomems.  - LHC (6/17): Normal coronaries.  - Echo (10/18): EF 50-55%, severe LVH with speckled myocardium concerning for amyloidosis, restrictive diastolic function with septal flattening, severe MR, moderate-severe TR, severe biatrial enlargement, PASP 54 mmHg.  - PYP amyloid scan: Grade 3, suggestive of transthyretin amyloidosis.  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   - Echo (5/19): EF 55-60%, moderate LVH c/w amyloidosis, Mitraclip x 2 with mean MV gradient 3 mmHg, trace-mild MR, moderate TR, PASP 62 mmHg.  - Echo (6/19): EF 55-60%, moderate LVH with speckled myocardium, trivial MR s/p Mitraclip with mean gradient 2 mmHg, PASP 61.  - Cardiomems placed 2019  5. Mitral regurgitation:  Think mixed functional/degenerative - TEE (7/18): EF 45-50%, moderate-severe MR, mild-moderate TR.  - Echo (10/18) read as severe MR.  - 2/19 echo with severe MR.  - 2/19 cardiac MRI with moderate to severe MR.  - Mitraclip x 2 in 5/19.   - Echo 5/19 post-Mitraclip with mean MV  gradient 3 mmHg, trace-mild MR.  6. CKD stage 3 7. Hypothyroidism 8. H/o renal cell CA s/p nephrectomy.  9. Bilateral carpal tunnel releases.   Social History   Socioeconomic History  . Marital status: Divorced    Spouse name: Not on file  . Number of children: 5  . Years of education: 56  . Highest education level: Not on file  Occupational History  . Occupation: retired    Fish farm manager: [employer]  Tobacco Use  . Smoking status: Never Smoker  . Smokeless tobacco: Never Used  Vaping Use  . Vaping Use: Never used  Substance and Sexual Activity  . Alcohol use: No  . Drug use: No  . Sexual activity: Not Currently  Other Topics Concern  . Not on file  Social History Narrative   Lives alone in a one story home.  Her son stays with her some.  Has 5 children, 10 grandchildren and 10 great grandchildren.  Retired Training and development officer at [employer].  Education: high school.        07/14/18- list food insecurity but is aware of food pantries and gets $15 in food stamps, reports issues with medical bills but has insurance and gets extra help through medicaid for premiums- encouraged pt to call billing to set up reasonable payment plan so it doesn't go to collections.   Social Determinants of Health   Financial Resource Strain: Low Risk   . Difficulty of Paying Living Expenses: Not hard at all  Food Insecurity: No Food Insecurity  . Worried About Charity fundraiser in the Last Year: Never true  . Ran Out of Food in the Last Year: Never true  Transportation Needs: No Transportation Needs  . Lack of Transportation (Medical): No  . Lack of Transportation (Non-Medical): No  Physical Activity:   . Days of Exercise per Week: Not on file  . Minutes of Exercise per Session: Not on file  Stress:   . Feeling of Stress : Not on file  Social Connections:   . Frequency of Communication with Friends and Family: Not on file  . Frequency of Social Gatherings with Friends and Family: Not on file  .  Attends Religious Services: Not on file  . Active Member of Clubs or Organizations: Not on file  . Attends Archivist Meetings: Not on file  . Marital Status: Not on file  Intimate Partner Violence: Not At Risk  . Fear of Current or Ex-Partner: No  . Emotionally Abused: No  . Physically Abused: No  . Sexually Abused: No   Family History  Problem Relation Age of Onset  . Stroke Brother   . Diabetes Mellitus II Brother   . Diabetes Mellitus II Sister   . Breast cancer Sister   . Colon cancer Sister    ROS: All systems reviewed and negative except as per HPI.   Current Outpatient Medications  Medication Sig Dispense Refill  . acetaminophen (TYLENOL) 500 MG tablet Take 1,000 mg by mouth every 6 (six) hours as needed for headache (pain).    Marland Kitchen amoxicillin (AMOXIL) 500 MG tablet Take 4 tablets (2,000 mg) 1 hour prior to dental procedures. 8 tablet 6  .  aspirin 81 MG tablet Take 1 tablet (81 mg total) daily by mouth.    . butalbital-acetaminophen-caffeine (FIORICET, ESGIC) 50-325-40 MG tablet Take 1 tablet by mouth every 12 (twelve) hours as needed for headache. 14 tablet 0  . carvedilol (COREG) 3.125 MG tablet Take 1 tablet (3.125 mg total) by mouth 2 (two) times daily with a meal. 90 tablet 3  . clopidogrel (PLAVIX) 75 MG tablet Take 1 tablet (75 mg total) by mouth daily. 30 tablet 1  . feeding supplement, ENSURE ENLIVE, (ENSURE ENLIVE) LIQD Take 237 mLs by mouth 2 (two) times daily between meals. 237 mL 0  . furosemide (LASIX) 20 MG tablet Take 1 tablet (20 mg total) by mouth daily. 30 tablet 3  . Hypromellose (ARTIFICIAL TEARS OP) Place 1 drop into both eyes daily as needed (dry eyes).    Marland Kitchen levothyroxine (SYNTHROID, LEVOTHROID) 25 MCG tablet Take 25 mcg by mouth daily before breakfast.    . potassium chloride SA (K-DUR,KLOR-CON) 20 MEQ tablet Take 1 tablet (20 mEq total) by mouth 2 (two) times daily. 60 tablet 0  . rosuvastatin (CRESTOR) 40 MG tablet Take 1 tablet (40 mg total)  by mouth daily. 90 tablet 3  . Tafamidis (VYNDAMAX) 61 MG CAPS Take 61 mg by mouth daily. 30 capsule 11   No current facility-administered medications for this encounter.   BP 128/88   Pulse 88   Wt 64.5 kg (142 lb 4.8 oz)   LMP 02/12/2013   SpO2 98%   BMI 25.21 kg/m   Vitals:   03/24/20 0850  BP: 128/88  Pulse: 88  SpO2: 98%    Wt Readings from Last 3 Encounters:  03/24/20 64.5 kg (142 lb 4.8 oz)  02/06/20 66.3 kg (146 lb 4 oz)  08/07/19 65 kg (143 lb 3.2 oz)   General:  Well appearing. No resp difficulty HEENT: normal Neck: supple. no JVD. Carotids 2+ bilat; no bruits. No lymphadenopathy or thryomegaly appreciated. Cor: PMI nondisplaced. Regular rate & rhythm. No rubs, gallops or murmurs. Lungs: clear Abdomen: soft, nontender, nondistended. No hepatosplenomegaly. No bruits or masses. Good bowel sounds. Extremities: no cyanosis, clubbing, rash, edema Neuro: alert & orientedx3, cranial nerves grossly intact. moves all 4 extremities w/o difficulty. Affect pleasant  EKG: SR 68 bpm low voltage QRS  Assessment/Plan: 1. Chronic diastolic CHF: Normal EF on 10/18 echo with severe LVH and speckled myocardium consistent with cardiac amyloidosis.  SPEP was negative. 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 Cardiac MRI also strongly suggestive of cardiac amyloidosis.  Cardiomems placed 2019.  - Cardiomems Goal 19. Todays reading is 17.  - NYHA III.   Volume status stable. Continue lasix 20 mg three times a  Week.  - Continue carvedilol 3.125 mg BID. Would not increase with heart rate in the 50-60s. Need to continue on carvedilol.  - EKG today. NSR on PVCs.  - Repeat ECHO next visit.  2. Mitral regurgitation: Mitral regurgitation seems to fluctuate somewhat based on loading conditions.  Most recently, echo in 2/19 suggested severe MR and cardiac MRI suggested moderate to severe MR  (moderate by 41% regurgitant fraction but visually severe). I suspect that the etiology is mixed - functional as well as degenerative with thickening and restriction of posterior leaflet. She is now s/p Mitraclip procedure in 5/19. Post-clip echo in 6/19 showed mean gradient 2 mmHg across the MV with trace MR.  3. CKD: Stage IIIa -She has follow up with  nephrology.  -   Check BMET today.   4. CVA: 7/18. Has LINQ monitor, no atrial fibrillation detected so far.  - Continue ASA 81.   - Continue Plavix. - Continue statin,  5. 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  6. Cardiac amyloidosis: Hereditary TTR amyloidosis.  She has peripheral neuropathy as well as bilateral carpal tunnel syndrome.   - She is now on tafamidis, no issues.  7. PVCs Zio Patch placed 01/2020- NSVT + 5.1% PVCs. Carvedilol was restarted 3.125 mg twice a day. Continue.  EKG - NSR with no PVCs.   Follow up in 6 months with Dr [name] and an ECHO. Check BMET /EKG today.    Neoma Uhrich NP-C   03/24/2020 9:04 AM

## 2020-03-24 ENCOUNTER — Other Ambulatory Visit: Payer: Self-pay

## 2020-03-24 ENCOUNTER — Ambulatory Visit (HOSPITAL_COMMUNITY)
Admission: RE | Admit: 2020-03-24 | Discharge: 2020-03-24 | Disposition: A | Discharge status: Home / Self Care | Payer: Medicare Other | Source: Ambulatory Visit | Attending: Adult Health | Admitting: Adult Health

## 2020-03-24 VITALS — BP 128/88 | HR 88 | Wt 142.3 lb

## 2020-03-24 DIAGNOSIS — Z7902 Long term (current) use of antithrombotics/antiplatelets: Secondary | ICD-10-CM | POA: Insufficient documentation

## 2020-03-24 DIAGNOSIS — I5032 Chronic diastolic (congestive) heart failure: Secondary | ICD-10-CM | POA: Diagnosis not present

## 2020-03-24 DIAGNOSIS — N1831 Chronic kidney disease, stage 3a: Secondary | ICD-10-CM

## 2020-03-24 DIAGNOSIS — I130 Hypertensive heart and chronic kidney disease with heart failure and stage 1 through stage 4 chronic kidney disease, or unspecified chronic kidney disease: Secondary | ICD-10-CM | POA: Diagnosis present

## 2020-03-24 DIAGNOSIS — Z8673 Personal history of transient ischemic attack (TIA), and cerebral infarction without residual deficits: Secondary | ICD-10-CM | POA: Insufficient documentation

## 2020-03-24 DIAGNOSIS — I493 Ventricular premature depolarization: Secondary | ICD-10-CM | POA: Diagnosis not present

## 2020-03-24 DIAGNOSIS — Z905 Acquired absence of kidney: Secondary | ICD-10-CM | POA: Insufficient documentation

## 2020-03-24 DIAGNOSIS — Z79890 Hormone replacement therapy (postmenopausal): Secondary | ICD-10-CM | POA: Insufficient documentation

## 2020-03-24 DIAGNOSIS — Z85528 Personal history of other malignant neoplasm of kidney: Secondary | ICD-10-CM | POA: Insufficient documentation

## 2020-03-24 DIAGNOSIS — G629 Polyneuropathy, unspecified: Secondary | ICD-10-CM | POA: Diagnosis not present

## 2020-03-24 DIAGNOSIS — R001 Bradycardia, unspecified: Secondary | ICD-10-CM

## 2020-03-24 DIAGNOSIS — E039 Hypothyroidism, unspecified: Secondary | ICD-10-CM | POA: Insufficient documentation

## 2020-03-24 DIAGNOSIS — Z79899 Other long term (current) drug therapy: Secondary | ICD-10-CM | POA: Diagnosis not present

## 2020-03-24 DIAGNOSIS — Z954 Presence of other heart-valve replacement: Secondary | ICD-10-CM | POA: Diagnosis not present

## 2020-03-24 DIAGNOSIS — I43 Cardiomyopathy in diseases classified elsewhere: Secondary | ICD-10-CM | POA: Insufficient documentation

## 2020-03-24 DIAGNOSIS — I340 Nonrheumatic mitral (valve) insufficiency: Secondary | ICD-10-CM | POA: Diagnosis not present

## 2020-03-24 DIAGNOSIS — Z7982 Long term (current) use of aspirin: Secondary | ICD-10-CM | POA: Insufficient documentation

## 2020-03-24 DIAGNOSIS — E854 Organ-limited amyloidosis: Secondary | ICD-10-CM | POA: Diagnosis not present

## 2020-03-24 DIAGNOSIS — E785 Hyperlipidemia, unspecified: Secondary | ICD-10-CM | POA: Insufficient documentation

## 2020-03-24 LAB — BASIC METABOLIC PANEL
Anion gap: 9 (ref 5–15)
BUN: 18 mg/dL (ref 8–23)
CO2: 23 mmol/L (ref 22–32)
Calcium: 10 mg/dL (ref 8.9–10.3)
Chloride: 111 mmol/L (ref 98–111)
Creatinine, Ser: 1.88 mg/dL — ABNORMAL HIGH (ref 0.44–1.00)
GFR calc Af Amer: 30 mL/min — ABNORMAL LOW (ref >60)
GFR calc non Af Amer: 26 mL/min — ABNORMAL LOW (ref >60)
Glucose, Bld: 94 mg/dL (ref 70–99)
Potassium: 4.4 mmol/L (ref 3.5–5.1)
Sodium: 143 mmol/L (ref 135–145)

## 2020-03-24 NOTE — Addendum Note (Signed)
Encounter addended by: Kerry Dory, CMA on: 03/24/2020 5:16 PM  Actions taken: Order list changed, Diagnosis association updated

## 2020-03-24 NOTE — Patient Instructions (Addendum)
It was great to see you today! No medication changes are needed at this time.  Labs today We will only contact you if something comes back abnormal or we need to make some changes. Otherwise no news is good news!  Your physician recommends that you schedule a follow-up appointment in: 6 months with Dr [name] and echo -please give our office a call in January 2022 to arrange a follow up  Your physician has requested that you have an echocardiogram. Echocardiography is a painless test that uses sound waves to create images of your heart. It provides your doctor with information about the size and shape of your heart and how well your heart's chambers and valves are working. This procedure takes approximately one hour. There are no restrictions for this procedure.   Do the following things EVERYDAY: 1) Weigh yourself in the morning before breakfast. Write it down and keep it in a log. 2) Take your medicines as prescribed 3) Eat low salt foods--Limit salt (sodium) to 2000 mg per day.  4) Stay as active as you can everyday 5) Limit all fluids for the day to less than 2 liters  If you have any questions or concerns before your next appointment please send [place] a message through [place] or call our office at [phone].    TO LEAVE A MESSAGE FOR THE NURSE SELECT OPTION 2, PLEASE LEAVE A MESSAGE INCLUDING: . YOUR NAME . DATE OF BIRTH . CALL BACK NUMBER . REASON FOR CALL**this is important as we prioritize the call backs  YOU WILL RECEIVE A CALL BACK THE SAME DAY AS LONG AS YOU CALL BEFORE 4:00 PM

## 2020-03-30 LAB — CUP PACEART REMOTE DEVICE CHECK
Date Time Interrogation Session: 20210827231829
Implantable Pulse Generator Implant Date: 20180720

## 2020-03-31 ENCOUNTER — Ambulatory Visit (INDEPENDENT_AMBULATORY_CARE_PROVIDER_SITE_OTHER): Payer: Medicare Other | Admitting: *Deleted

## 2020-03-31 DIAGNOSIS — I639 Cerebral infarction, unspecified: Secondary | ICD-10-CM

## 2020-04-01 NOTE — Progress Notes (Signed)
Carelink Summary Report / Loop Recorder 

## 2020-04-02 ENCOUNTER — Other Ambulatory Visit: Payer: Self-pay | Admitting: *Deleted

## 2020-04-02 ENCOUNTER — Encounter: Payer: Self-pay | Admitting: *Deleted

## 2020-04-02 NOTE — Patient Outreach (Signed)
[facility]) Care Management  [place]  04/02/2020   Katie [name] 1945/12/22 [id number]   RN Health Coach Quarterly Outreach   Referral Date:  02/11/2019 Referral Source:  Transfer from [place] Reason for Referral:  Continued Disease Management Education Insurance:  [employer]    Outreach Attempt:  Successful telephone outreach to patient for follow up.  HIPAA verified with patient.  Patient reporting she overall doing well.  Does report having bursted blood vessel in eye that is healing after using dry eye drops and eye provider recommended.  Also reports some nasal stuffiness at times.  Denies fever or cough.  Continues to weigh herself daily.  Weight this morning was 144 pounds.  Reports lower extremity/ankle swelling throughout the day that goes away with rest at night.  Denies any shortness of breath or recent falls.  Encounter Medications:  Outpatient Encounter Medications as of 04/02/2020  Medication Sig Note  . aspirin 81 MG tablet Take 1 tablet (81 mg total) daily by mouth.   . carvedilol (COREG) 3.125 MG tablet Take 1 tablet (3.125 mg total) by mouth 2 (two) times daily with a meal.   . clopidogrel (PLAVIX) 75 MG tablet Take 1 tablet (75 mg total) by mouth daily.   . feeding supplement, ENSURE ENLIVE, (ENSURE ENLIVE) LIQD Take 237 mLs by mouth 2 (two) times daily between meals.   . furosemide (LASIX) 20 MG tablet Take 1 tablet (20 mg total) by mouth daily. 04/02/2020: Reports taking 3 days a week  . Hypromellose (ARTIFICIAL TEARS OP) Place 1 drop into both eyes daily as needed (dry eyes).   Marland Kitchen levothyroxine (SYNTHROID, LEVOTHROID) 25 MCG tablet Take 25 mcg by mouth daily before breakfast.   . potassium chloride SA (K-DUR,KLOR-CON) 20 MEQ tablet Take 1 tablet (20 mEq total) by mouth 2 (two) times daily.   . rosuvastatin (CRESTOR) 40 MG tablet Take 1 tablet (40 mg total) by mouth daily.   . Tafamidis (VYNDAMAX) 61 MG CAPS Take 61  mg by mouth daily.   Marland Kitchen acetaminophen (TYLENOL) 500 MG tablet Take 1,000 mg by mouth every 6 (six) hours as needed for headache (pain).   Marland Kitchen amoxicillin (AMOXIL) 500 MG tablet Take 4 tablets (2,000 mg) 1 hour prior to dental procedures.   . butalbital-acetaminophen-caffeine (FIORICET, ESGIC) 50-325-40 MG tablet Take 1 tablet by mouth every 12 (twelve) hours as needed for headache.    No facility-administered encounter medications on file as of 04/02/2020.    Functional Status:  In your present state of health, do you have any difficulty performing the following activities: 06/21/2019  Hearing? N  Vision? N  Difficulty concentrating or making decisions? N  Walking or climbing stairs? Y  Comment some difficulties climbing stairs  Dressing or bathing? N  Doing errands, shopping? N  Preparing Food and eating ? N  Using the Toilet? N  In the past six months, have you accidently leaked urine? N  Do you have problems with loss of bowel control? N  Managing your Medications? N  Managing your Finances? N  Housekeeping or managing your Housekeeping? N  Some recent data might be hidden    Fall/Depression Screening: Fall Risk  04/02/2020 12/10/2019 10/29/2019  Falls in the past year? 0 0 0  Number falls in past yr: 0 0 0  Injury with Fall? 0 0 0  Risk for fall due to : Impaired balance/gait;Impaired vision;Medication side effect Medication side effect Medication side effect  Follow up  Falls evaluation completed;Education provided;Falls prevention discussed Falls evaluation completed;Education provided;Falls prevention discussed Falls evaluation completed;Education provided;Falls prevention discussed   PHQ 2/9 Scores 09/04/2019 03/07/2018 02/22/2018 12/12/2017 07/28/2017 03/03/2017 02/22/2017  PHQ - 2 Score 0 0 0 0 0 0 0   Goals Addressed              This Visit's Progress   .  [facility]) Patient will report no unplanned hospitalizations within the next 90 days. (pt-stated)        CARE PLAN ENTRY (see  longtitudinal plan of care for additional care plan information)   Current Barriers:  Marland Kitchen Knowledge deficit related to basic heart failure pathophysiology and self care management  Case Manager Clinical Goal(s):  Marland Kitchen Over the next 90 days, patient will verbalize understanding of Heart Failure Action Plan and when to call doctor . Over the next 90 days, patient will take all Heart Failure mediations as prescribed . Over the next 90 days, patient will weigh daily and record (notifying MD of 3 lb weight gain over night or 5 lb in a week) . Patient will verbalize two symptoms of CHF exacerbation within the next 90 days.    Interventions:  . Basic overview and discussion of pathophysiology of Heart Failure reviewed  . Provided verbal education on low sodium diet . Reviewed Heart Failure Action Plan in depth and provided written copy . Assessed need for readable accurate scales in home . Provided education about placing scale on hard, flat surface . Advised patient to weigh each morning after emptying bladder . Discussed importance of daily weight and advised patient to weigh and record daily . Reviewed role of diuretics in prevention of fluid overload and management of heart failure  Patient Self Care Activities:  . Takes Heart Failure Medications as prescribed . Weighs daily and record (notifying MD of 3 lb weight gain over night or 5 lb in a week) . Verbalizes understanding of and follows CHF Action Plan . Adheres to low sodium diet  Initial goal documentation       Appointments:  Attended appointment with primary care provider, Dr. [name] on 01/25/2020 and has follow up scheduled this Friday, 9/3.  Attended appointment with Heart Failure [facility] on 03/24/2020 and was seen by Nephrologist today.  Plan: RN Health Coach will send primary care provider quarterly update. RN Health Coach will make next telephone outreach to patient within the month of December and patient agrees to future  outreach.  [place]a Paha [phone] [email or web address]

## 2020-04-10 ENCOUNTER — Ambulatory Visit: Payer: Self-pay | Admitting: *Deleted

## 2020-04-16 MED FILL — VYNDAMAX 61 MG CAPS: 61 | 30 days supply | Qty: 30 | Fill #8

## 2020-05-01 LAB — CUP PACEART REMOTE DEVICE CHECK
Date Time Interrogation Session: 20210929233313
Implantable Pulse Generator Implant Date: 20180720

## 2020-05-05 ENCOUNTER — Ambulatory Visit (INDEPENDENT_AMBULATORY_CARE_PROVIDER_SITE_OTHER): Payer: Medicare Other

## 2020-05-05 DIAGNOSIS — I639 Cerebral infarction, unspecified: Secondary | ICD-10-CM

## 2020-05-06 NOTE — Progress Notes (Signed)
Carelink Summary Report / Loop Recorder 

## 2020-05-19 MED FILL — VYNDAMAX 61 MG CAPS: 61 | 30 days supply | Qty: 30 | Fill #9

## 2020-06-04 LAB — CUP PACEART REMOTE DEVICE CHECK
Date Time Interrogation Session: 20211101233157
Implantable Pulse Generator Implant Date: 20180720

## 2020-06-05 ENCOUNTER — Encounter (HOSPITAL_COMMUNITY): Payer: Self-pay | Admitting: Family Medicine

## 2020-06-05 ENCOUNTER — Emergency Department (HOSPITAL_COMMUNITY): Payer: Medicare Other

## 2020-06-05 ENCOUNTER — Other Ambulatory Visit: Payer: Self-pay

## 2020-06-05 ENCOUNTER — Inpatient Hospital Stay (HOSPITAL_COMMUNITY)
Admission: EM | Admit: 2020-06-05 | Discharge: 2020-06-17 | DRG: 558 | Disposition: A | Discharge status: Skilled Nursing Facility | Payer: Medicare Other | Attending: Internal Medicine | Admitting: Internal Medicine

## 2020-06-05 ENCOUNTER — Observation Stay (HOSPITAL_COMMUNITY): Payer: Medicare Other

## 2020-06-05 DIAGNOSIS — M6282 Rhabdomyolysis: Secondary | ICD-10-CM | POA: Diagnosis not present

## 2020-06-05 DIAGNOSIS — E785 Hyperlipidemia, unspecified: Secondary | ICD-10-CM | POA: Diagnosis present

## 2020-06-05 DIAGNOSIS — N179 Acute kidney failure, unspecified: Secondary | ICD-10-CM

## 2020-06-05 DIAGNOSIS — I5042 Chronic combined systolic (congestive) and diastolic (congestive) heart failure: Secondary | ICD-10-CM | POA: Diagnosis present

## 2020-06-05 DIAGNOSIS — E872 Acidosis, unspecified: Secondary | ICD-10-CM | POA: Diagnosis present

## 2020-06-05 DIAGNOSIS — R0781 Pleurodynia: Secondary | ICD-10-CM | POA: Diagnosis present

## 2020-06-05 DIAGNOSIS — Z20822 Contact with and (suspected) exposure to covid-19: Secondary | ICD-10-CM | POA: Diagnosis present

## 2020-06-05 DIAGNOSIS — E7800 Pure hypercholesterolemia, unspecified: Secondary | ICD-10-CM | POA: Diagnosis present

## 2020-06-05 DIAGNOSIS — T466X5A Adverse effect of antihyperlipidemic and antiarteriosclerotic drugs, initial encounter: Secondary | ICD-10-CM | POA: Diagnosis present

## 2020-06-05 DIAGNOSIS — E854 Organ-limited amyloidosis: Secondary | ICD-10-CM | POA: Diagnosis not present

## 2020-06-05 DIAGNOSIS — R778 Other specified abnormalities of plasma proteins: Secondary | ICD-10-CM

## 2020-06-05 DIAGNOSIS — I5032 Chronic diastolic (congestive) heart failure: Secondary | ICD-10-CM

## 2020-06-05 DIAGNOSIS — Z85528 Personal history of other malignant neoplasm of kidney: Secondary | ICD-10-CM

## 2020-06-05 DIAGNOSIS — E039 Hypothyroidism, unspecified: Secondary | ICD-10-CM

## 2020-06-05 DIAGNOSIS — Z905 Acquired absence of kidney: Secondary | ICD-10-CM

## 2020-06-05 DIAGNOSIS — Z803 Family history of malignant neoplasm of breast: Secondary | ICD-10-CM

## 2020-06-05 DIAGNOSIS — E860 Dehydration: Secondary | ICD-10-CM | POA: Diagnosis not present

## 2020-06-05 DIAGNOSIS — E663 Overweight: Secondary | ICD-10-CM | POA: Diagnosis present

## 2020-06-05 DIAGNOSIS — I43 Cardiomyopathy in diseases classified elsewhere: Secondary | ICD-10-CM

## 2020-06-05 DIAGNOSIS — R7401 Elevation of levels of liver transaminase levels: Secondary | ICD-10-CM

## 2020-06-05 DIAGNOSIS — Z885 Allergy status to narcotic agent status: Secondary | ICD-10-CM

## 2020-06-05 DIAGNOSIS — I081 Rheumatic disorders of both mitral and tricuspid valves: Secondary | ICD-10-CM | POA: Diagnosis present

## 2020-06-05 DIAGNOSIS — I272 Pulmonary hypertension, unspecified: Secondary | ICD-10-CM | POA: Diagnosis present

## 2020-06-05 DIAGNOSIS — Z7982 Long term (current) use of aspirin: Secondary | ICD-10-CM

## 2020-06-05 DIAGNOSIS — G629 Polyneuropathy, unspecified: Secondary | ICD-10-CM | POA: Diagnosis present

## 2020-06-05 DIAGNOSIS — Z8673 Personal history of transient ischemic attack (TIA), and cerebral infarction without residual deficits: Secondary | ICD-10-CM

## 2020-06-05 DIAGNOSIS — Z800 Family history of malignant neoplasm of digestive organs: Secondary | ICD-10-CM

## 2020-06-05 DIAGNOSIS — Z7902 Long term (current) use of antithrombotics/antiplatelets: Secondary | ICD-10-CM

## 2020-06-05 DIAGNOSIS — Z833 Family history of diabetes mellitus: Secondary | ICD-10-CM

## 2020-06-05 DIAGNOSIS — Z6823 Body mass index (BMI) 23.0-23.9, adult: Secondary | ICD-10-CM

## 2020-06-05 DIAGNOSIS — N1832 Chronic kidney disease, stage 3b: Secondary | ICD-10-CM

## 2020-06-05 DIAGNOSIS — Z79899 Other long term (current) drug therapy: Secondary | ICD-10-CM

## 2020-06-05 DIAGNOSIS — I130 Hypertensive heart and chronic kidney disease with heart failure and stage 1 through stage 4 chronic kidney disease, or unspecified chronic kidney disease: Secondary | ICD-10-CM | POA: Diagnosis present

## 2020-06-05 DIAGNOSIS — Z79890 Hormone replacement therapy (postmenopausal): Secondary | ICD-10-CM

## 2020-06-05 DIAGNOSIS — Z823 Family history of stroke: Secondary | ICD-10-CM

## 2020-06-05 DIAGNOSIS — N189 Chronic kidney disease, unspecified: Secondary | ICD-10-CM

## 2020-06-05 LAB — URINALYSIS, ROUTINE W REFLEX MICROSCOPIC
Bilirubin Urine: NEGATIVE
Glucose, UA: NEGATIVE mg/dL
Ketones, ur: NEGATIVE mg/dL
Leukocytes,Ua: NEGATIVE
Nitrite: NEGATIVE
Protein, ur: 100 mg/dL — AB
Specific Gravity, Urine: 1.005 (ref 1.005–1.030)
pH: 5 (ref 5.0–8.0)

## 2020-06-05 LAB — COMPREHENSIVE METABOLIC PANEL
ALT: 335 U/L — ABNORMAL HIGH (ref 0–44)
AST: 697 U/L — ABNORMAL HIGH (ref 15–41)
Albumin: 3.2 g/dL — ABNORMAL LOW (ref 3.5–5.0)
Alkaline Phosphatase: 93 U/L (ref 38–126)
Anion gap: 12 (ref 5–15)
BUN: 41 mg/dL — ABNORMAL HIGH (ref 8–23)
CO2: 17 mmol/L — ABNORMAL LOW (ref 22–32)
Calcium: 10.3 mg/dL (ref 8.9–10.3)
Chloride: 112 mmol/L — ABNORMAL HIGH (ref 98–111)
Creatinine, Ser: 3.84 mg/dL — ABNORMAL HIGH (ref 0.44–1.00)
GFR, Estimated: 12 mL/min — ABNORMAL LOW (ref >60)
Glucose, Bld: 145 mg/dL — ABNORMAL HIGH (ref 70–99)
Potassium: 4.4 mmol/L (ref 3.5–5.1)
Sodium: 141 mmol/L (ref 135–145)
Total Bilirubin: 0.8 mg/dL (ref 0.3–1.2)
Total Protein: 7.5 g/dL (ref 6.5–8.1)

## 2020-06-05 LAB — CBC WITH DIFFERENTIAL/PLATELET
Abs Immature Granulocytes: 0.04 10*3/uL (ref 0.00–0.07)
Basophils Absolute: 0 10*3/uL (ref 0.0–0.1)
Basophils Relative: 0 %
Eosinophils Absolute: 0 10*3/uL (ref 0.0–0.5)
Eosinophils Relative: 0 %
HCT: 42.6 % (ref 36.0–46.0)
Hemoglobin: 13.4 g/dL (ref 12.0–15.0)
Immature Granulocytes: 1 %
Lymphocytes Relative: 17 %
Lymphs Abs: 1.4 10*3/uL (ref 0.7–4.0)
MCH: 31.2 pg (ref 26.0–34.0)
MCHC: 31.5 g/dL (ref 30.0–36.0)
MCV: 99.3 fL (ref 80.0–100.0)
Monocytes Absolute: 0.8 10*3/uL (ref 0.1–1.0)
Monocytes Relative: 10 %
Neutro Abs: 5.8 10*3/uL (ref 1.7–7.7)
Neutrophils Relative %: 72 %
Platelets: 270 10*3/uL (ref 150–400)
RBC: 4.29 MIL/uL (ref 3.87–5.11)
RDW: 13.4 % (ref 11.5–15.5)
WBC: 8 10*3/uL (ref 4.0–10.5)
nRBC: 0 % (ref 0.0–0.2)

## 2020-06-05 LAB — CBG MONITORING, ED: Glucose-Capillary: 132 mg/dL — ABNORMAL HIGH (ref 70–99)

## 2020-06-05 LAB — LACTIC ACID, PLASMA: Lactic Acid, Venous: 1.1 mmol/L (ref 0.5–1.9)

## 2020-06-05 LAB — RESPIRATORY PANEL BY RT PCR (FLU A&B, COVID)
Influenza A by PCR: NEGATIVE
Influenza B by PCR: NEGATIVE
SARS Coronavirus 2 by RT PCR: NEGATIVE

## 2020-06-05 LAB — TROPONIN I (HIGH SENSITIVITY): Troponin I (High Sensitivity): 269 ng/L (ref <18)

## 2020-06-05 MED ORDER — ONDANSETRON HCL 4 MG PO TABS: 4.0000 mg | ORAL_TABLET | Freq: Four times a day (QID) | ORAL | Status: DC | PRN

## 2020-06-05 MED ORDER — SODIUM CHLORIDE 0.9 % IV BOLUS
500.0000 mL | Freq: Once | INTRAVENOUS | Status: AC
  Administered 2020-06-05: 500 mL via INTRAVENOUS

## 2020-06-05 MED ORDER — ROSUVASTATIN CALCIUM 5 MG PO TABS: 10.0000 mg | ORAL_TABLET | Freq: Every day | ORAL | Status: DC

## 2020-06-05 MED ORDER — HEPARIN SODIUM (PORCINE) 5000 UNIT/ML IJ SOLN
5000.0000 [IU] | Freq: Three times a day (TID) | INTRAMUSCULAR | Status: DC
  Administered 2020-06-05 – 2020-06-17 (×34): 5000 [IU] via SUBCUTANEOUS
  Filled 2020-06-05 (×36): qty 1

## 2020-06-05 MED ORDER — SODIUM BICARBONATE 650 MG PO TABS
650.0000 mg | ORAL_TABLET | Freq: Two times a day (BID) | ORAL | Status: DC
  Administered 2020-06-05 – 2020-06-17 (×24): 650 mg via ORAL
  Filled 2020-06-05 (×24): qty 1

## 2020-06-05 MED ORDER — CLOPIDOGREL BISULFATE 75 MG PO TABS
75.0000 mg | ORAL_TABLET | Freq: Every day | ORAL | Status: DC
  Administered 2020-06-06 – 2020-06-17 (×12): 75 mg via ORAL
  Filled 2020-06-05 (×12): qty 1

## 2020-06-05 MED ORDER — CARVEDILOL 3.125 MG PO TABS
3.1250 mg | ORAL_TABLET | Freq: Two times a day (BID) | ORAL | Status: DC
  Administered 2020-06-06 – 2020-06-17 (×24): 3.125 mg via ORAL
  Filled 2020-06-05 (×26): qty 1

## 2020-06-05 MED ORDER — ACETAMINOPHEN 650 MG RE SUPP: 650.0000 mg | Freq: Four times a day (QID) | RECTAL | Status: DC | PRN

## 2020-06-05 MED ORDER — ACETAMINOPHEN 325 MG PO TABS
650.0000 mg | ORAL_TABLET | Freq: Four times a day (QID) | ORAL | Status: DC | PRN
  Administered 2020-06-06 – 2020-06-15 (×18): 650 mg via ORAL
  Filled 2020-06-05 (×19): qty 2

## 2020-06-05 MED ORDER — ONDANSETRON HCL 4 MG/2ML IJ SOLN
4.0000 mg | Freq: Four times a day (QID) | INTRAMUSCULAR | Status: DC | PRN
  Administered 2020-06-10: 4 mg via INTRAVENOUS
  Filled 2020-06-05: qty 2

## 2020-06-05 MED ORDER — ASPIRIN EC 81 MG PO TBEC
81.0000 mg | DELAYED_RELEASE_TABLET | Freq: Every day | ORAL | Status: DC
  Administered 2020-06-06 – 2020-06-17 (×12): 81 mg via ORAL
  Filled 2020-06-05 (×12): qty 1

## 2020-06-05 MED ORDER — TAFAMIDIS 61 MG PO CAPS
61.0000 mg | ORAL_CAPSULE | Freq: Every day | ORAL | Status: DC
  Administered 2020-06-07 – 2020-06-17 (×11): 61 mg via ORAL
  Filled 2020-06-05 (×13): qty 1

## 2020-06-05 MED ORDER — LEVOTHYROXINE SODIUM 25 MCG PO TABS
25.0000 ug | ORAL_TABLET | Freq: Every day | ORAL | Status: DC
  Administered 2020-06-06 – 2020-06-17 (×12): 25 ug via ORAL
  Filled 2020-06-05 (×12): qty 1

## 2020-06-05 MED ORDER — ROSUVASTATIN CALCIUM 5 MG PO TABS: 40.0000 mg | ORAL_TABLET | Freq: Every day | ORAL | Status: DC

## 2020-06-05 MED ORDER — SENNOSIDES-DOCUSATE SODIUM 8.6-50 MG PO TABS: 1.0000 | ORAL_TABLET | Freq: Every evening | ORAL | Status: DC | PRN

## 2020-06-05 MED ORDER — SODIUM CHLORIDE 0.9 % IV SOLN: INTRAVENOUS | Status: AC

## 2020-06-05 NOTE — H&P (Addendum)
History and Physical    Katie [name]:[id number] DOB: 30-Jun-1946 DOA: 06/05/2020  PCP: Seward Carol, MD   Patient coming from: Home   Chief Complaint: General malaise, lightheaded on standing, increased creatinine on outpatient labs   HPI: Katie [name] is a 74 y.o. female with medical history significant for chronic kidney disease stage IIIb, history of nephrectomy, history of CVA, cardiac amyloidosis and chronic diastolic CHF, and hypothyroidism, now presenting to emergency department for evaluation of general malaise, lightheadedness on standing, and increased creatinine on outpatient blood work.  Patient reports that she saw her PCP for evaluation of the aforementioned symptoms which began approximately 2 weeks ago, have blood work performed, and daughter was notified of her elevated creatinine and recommendation for evaluation in the emergency department.  She has lightheadedness on standing for the past couple weeks but denies any syncopal episodes.  She reports some palpitations over the past couple weeks, mainly when she stands up.  She denies any alcohol use, abdominal pain, nausea, vomiting, diarrhea, fevers, or chills.  She does not use alcohol.  No focal numbness or weakness.  ED Course: Upon arrival to the ED, patient is found to be afebrile, saturating well on room air, and with stable blood pressure.  EKG features sinus rhythm and chest x-rays negative for acute cardiopulmonary disease.  CBC is unremarkable and lactic acid reassuringly normal.  Chemistry panel is notable for bicarbonate of 17, BUN 41, creatinine of 3.84, AST 697, and ALT 335.  Urinalysis with 100 protein and large hemoglobin on dipstick but no RBC on microscopy.  Covid 19 screening test is negative.  Patient was given 500 cc of saline in the ED.  Review of Systems:  All other systems reviewed and apart from HPI, are negative.  Past Medical History:  Diagnosis Date  . 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 Cancer  [facility])    kidney cancer  . Chronic combined systolic and diastolic CHF (congestive heart failure) ([place])   . CKD (chronic kidney disease), stage III ([place])    removal of right kidney due to carcinoma  . Dyspnea   . Headache   . History of kidney cancer    a. s/p right nephrectomy ~2007.  Marland Kitchen History of loop recorder   . Hypercholesterolemia   . Hypertension   . Hypothyroidism   . Mitral regurgitation   . Pulmonary hypertension ([place])   . Stroke [facility])    no residual    Past Surgical History:  Procedure Laterality Date  . BREAST EXCISIONAL BIOPSY Left   . BREAST EXCISIONAL BIOPSY Right   . CARDIAC CATHETERIZATION N/A 01/12/2016   Procedure: Right/Left Heart Cath and Coronary/Graft Angiography;  Surgeon: Belva Crome, MD;  Location: [place] [facility];  Service: Cardiovascular;  Laterality: N/A;  . CERVICAL POLYPECTOMY N/A 04/23/2013   Procedure: CERVICAL POLYPECTOMY;  Surgeon: Maeola Sarah. Landry Mellow, MD;  Location: [facility];  Service: Gynecology;  Laterality: N/A;  Possible Polypectomy  . HYSTEROSCOPY WITH D & C N/A 04/23/2013   Procedure: DILATATION AND CURETTAGE /HYSTEROSCOPY;  Surgeon: Maeola Sarah. Landry Mellow, MD;  Location: [facility];  Service: Gynecology;  Laterality: N/A;  . IR ANGIO INTRA EXTRACRAN SEL COM CAROTID INNOMINATE BILAT MOD SED  02/11/2017  . IR ANGIO VERTEBRAL SEL VERTEBRAL BILAT MOD SED  02/11/2017  . KNEE ARTHROSCOPY Left   . LOOP RECORDER INSERTION N/A 02/18/2017   Procedure: Loop Recorder Insertion;  Surgeon: Constance Haw, MD;  Location: [place] [facility];  Service:  Cardiovascular;  Laterality: N/A;  . MITRAL VALVE REPAIR  11/30/2017  . MITRAL VALVE REPAIR N/A 11/30/2017   Procedure: MITRAL VALVE REPAIR;  Surgeon: Sherren Mocha, MD;  Location: [place] [facility];  Service: Cardiovascular;  Laterality: N/A;  . NEPHRECTOMY Right    due to carcinoma  . TEE WITHOUT CARDIOVERSION N/A 02/18/2017   Procedure: TRANSESOPHAGEAL ECHOCARDIOGRAM (TEE) WITH LOOP;  Surgeon: Acie Fredrickson Wonda Cheng, MD;   Location: [facility];  Service: Cardiovascular;  Laterality: N/A;    Social History:   reports that she has never smoked. She has never used smokeless tobacco. She reports that she does not drink alcohol and does not use drugs.  Allergies  Allergen Reactions  . Tramadol Shortness Of Breath and Palpitations    Family History  Problem Relation Age of Onset  . Stroke Brother   . Diabetes Mellitus II Brother   . Diabetes Mellitus II Sister   . Breast cancer Sister   . Colon cancer Sister      Prior to Admission medications   Medication Sig Start Date End Date Taking? Authorizing Provider  acetaminophen (TYLENOL) 500 MG tablet Take 1,000 mg by mouth every 6 (six) hours as needed for headache (pain).   Yes [provider]  amoxicillin (AMOXIL) 500 MG tablet Take 4 tablets (2,000 mg) 1 hour prior to dental procedures. 01/04/18  Yes Eileen Stanford, PA-C  aspirin 81 MG tablet Take 1 tablet (81 mg total) daily by mouth. 06/14/17  Yes Larey Dresser, MD  butalbital-acetaminophen-caffeine (FIORICET, ESGIC) [phone] MG tablet Take 1 tablet by mouth every 12 (twelve) hours as needed for headache. 02/17/17  Yes Eulogio Bear U, DO  carvedilol (COREG) 3.125 MG tablet Take 1 tablet (3.125 mg total) by mouth 2 (two) times daily with a meal. 03/03/20  Yes Larey Dresser, MD  clopidogrel (PLAVIX) 75 MG tablet Take 1 tablet (75 mg total) by mouth daily. 02/13/17  Yes Lady Deutscher, MD  feeding supplement, ENSURE ENLIVE, (ENSURE ENLIVE) LIQD Take 237 mLs by mouth 2 (two) times daily between meals. 02/17/17  Yes Vann, Jessica U, DO  furosemide (LASIX) 20 MG tablet Take 1 tablet (20 mg total) by mouth daily. Patient taking differently: Take 20 mg by mouth every Monday, Wednesday, and Friday.  03/03/20  Yes Larey Dresser, MD  Hypromellose (ARTIFICIAL TEARS OP) Place 1 drop into both eyes daily as needed (dry eyes).   Yes [provider]  levothyroxine (SYNTHROID, LEVOTHROID) 25  MCG tablet Take 25 mcg by mouth daily before breakfast.   Yes [provider]  potassium chloride SA (K-DUR,KLOR-CON) 20 MEQ tablet Take 1 tablet (20 mEq total) by mouth 2 (two) times daily. 09/11/17  Yes Mikhail, Maryann, DO  rosuvastatin (CRESTOR) 40 MG tablet Take 1 tablet (40 mg total) by mouth daily. 01/09/20  Yes Larey Dresser, MD  Tafamidis [facility]) 61 MG CAPS Take 61 mg by mouth daily. 08/22/19  Yes Larey Dresser, MD    Physical Exam: Vitals:   06/05/20 1929 06/05/20 1940  BP: (!) 149/68   Pulse: 91   Resp: 18   Temp: 98 F (36.7 C)   TempSrc: Oral   SpO2: 97%   Weight:  64.9 kg  Height:  5\' 5"  (1.651 m)    Constitutional: NAD, calm  Eyes: PERTLA, lids and conjunctivae normal ENMT: Mucous membranes are moist. Posterior pharynx clear of any exudate or lesions.   Neck: normal, supple, no masses, no thyromegaly Respiratory: no wheezing,  no crackles. No accessory muscle use.  Cardiovascular: S1 & S2 heard, regular rate and rhythm. No extremity edema.  Abdomen: No distension, no tenderness, soft. Bowel sounds active.  Musculoskeletal: no clubbing / cyanosis. No joint deformity upper and lower extremities.   Skin: no significant rashes, lesions, ulcers. Warm, dry, well-perfused. Neurologic: No gross facial asymmtery. Sensation intact. Moving all extremities.  Psychiatric: Alert and oriented to person, place, and situation. Very pleasant and cooperative.    Labs and Imaging on Admission: I have personally reviewed following labs and imaging studies  CBC: Recent Labs  Lab 06/05/20 1943  WBC 8.0  NEUTROABS 5.8  HGB 13.4  HCT 42.6  MCV 99.3  PLT 250   Basic Metabolic Panel: Recent Labs  Lab 06/05/20 1943  NA 141  K 4.4  CL 112*  CO2 17*  GLUCOSE 145*  BUN 41*  CREATININE 3.84*  CALCIUM 10.3   GFR: Estimated Creatinine Clearance: 11.4 mL/min (A) (by C-G formula based on SCr of 3.84 mg/dL (H)). Liver Function Tests: Recent Labs  Lab  06/05/20 1943  AST 697*  ALT 335*  ALKPHOS 93  BILITOT 0.8  PROT 7.5  ALBUMIN 3.2*   No results for input(s): LIPASE, AMYLASE in the last 168 hours. No results for input(s): AMMONIA in the last 168 hours. Coagulation Profile: No results for input(s): INR, PROTIME in the last 168 hours. Cardiac Enzymes: No results for input(s): CKTOTAL, CKMB, CKMBINDEX, TROPONINI in the last 168 hours. BNP (last 3 results) No results for input(s): PROBNP in the last 8760 hours. HbA1C: No results for input(s): HGBA1C in the last 72 hours. CBG: Recent Labs  Lab 06/05/20 2018  GLUCAP 132*   Lipid Profile: No results for input(s): CHOL, HDL, LDLCALC, TRIG, CHOLHDL, LDLDIRECT in the last 72 hours. Thyroid Function Tests: No results for input(s): TSH, T4TOTAL, FREET4, T3FREE, THYROIDAB in the last 72 hours. Anemia Panel: No results for input(s): VITAMINB12, FOLATE, FERRITIN, TIBC, IRON, RETICCTPCT in the last 72 hours. Urine analysis:    Component Value Date/Time   COLORURINE YELLOW 06/05/2020 1943   APPEARANCEUR HAZY (A) 06/05/2020 1943   LABSPEC 1.005 06/05/2020 1943   PHURINE 5.0 06/05/2020 1943   GLUCOSEU NEGATIVE 06/05/2020 1943   HGBUR LARGE (A) 06/05/2020 1943   BILIRUBINUR NEGATIVE 06/05/2020 [place] NEGATIVE 06/05/2020 1943   PROTEINUR 100 (A) 06/05/2020 1943   UROBILINOGEN 0.2 06/27/2009 1452   NITRITE NEGATIVE 06/05/2020 [place] 06/05/2020 1943   Sepsis Labs: @LABRCNTIP (procalcitonin:4,lacticidven:4) ) Recent Results (from the past 240 hour(s))  Respiratory Panel by RT PCR (Flu A&B, Covid) - Nasopharyngeal Swab     Status: None   Collection Time: 06/05/20  7:43 PM   Specimen: Nasopharyngeal Swab  Result Value Ref Range Status   SARS Coronavirus 2 by RT PCR NEGATIVE NEGATIVE Final    Comment: (NOTE) SARS-CoV-2 target nucleic acids are NOT DETECTED.  The SARS-CoV-2 RNA is generally detectable in upper respiratoy specimens during the acute  phase of infection. The lowest concentration of SARS-CoV-2 viral copies this assay can detect is 131 copies/mL. A negative result does not preclude SARS-Cov-2 infection and should not be used as the sole basis for treatment or other patient management decisions. A negative result may occur with  improper specimen collection/handling, submission of specimen other than nasopharyngeal swab, presence of viral mutation(s) within the areas targeted by this assay, and inadequate number of viral copies (<131 copies/mL). A negative result must be combined with clinical observations, patient history,  and epidemiological information. The expected result is Negative.  Fact Sheet for Patients:  PinkCheek.be  Fact Sheet for Healthcare Providers:  GravelBags.it  This test is no t yet approved or cleared by the [place] FDA and  has been authorized for detection and/or diagnosis of SARS-CoV-2 by FDA under an Emergency Use Authorization (EUA). This EUA will remain  in effect (meaning this test can be used) for the duration of the COVID-19 declaration under Section 564(b)(1) of the Act, 21 U.S.C. section 360bbb-3(b)(1), unless the authorization is terminated or revoked sooner.     Influenza A by PCR NEGATIVE NEGATIVE Final   Influenza B by PCR NEGATIVE NEGATIVE Final    Comment: (NOTE) The Xpert Xpress SARS-CoV-2/FLU/RSV assay is intended as an aid in  the diagnosis of influenza from Nasopharyngeal swab specimens and  should not be used as a sole basis for treatment. Nasal washings and  aspirates are unacceptable for Xpert Xpress SARS-CoV-2/FLU/RSV  testing.  Fact Sheet for Patients: PinkCheek.be  Fact Sheet for Healthcare Providers: GravelBags.it  This test is not yet approved or cleared by the [place] FDA and  has been authorized for detection and/or diagnosis of  SARS-CoV-2 by  FDA under an Emergency Use Authorization (EUA). This EUA will remain  in effect (meaning this test can be used) for the duration of the  Covid-19 declaration under Section 564(b)(1) of the Act, 21  U.S.C. section 360bbb-3(b)(1), unless the authorization is  terminated or revoked. Performed at [facility], [place] [address], [place], [place] [address]      Radiological Exams on Admission: DG Chest Portable 1 View  Result Date: 06/05/2020 CLINICAL DATA:  Altered mental status EXAM: PORTABLE CHEST 1 VIEW COMPARISON:  11/30/2017 FINDINGS: Heart is borderline in size. Loop recorder device projects over the left heart. Lingular atelectasis or scarring. Right lung clear. No effusions or edema. No acute bony abnormality. IMPRESSION: Lingular atelectasis or scarring.  No active disease. Electronically Signed   By: Rolm Baptise M.D.   On: 06/05/2020 20:16    EKG: Independently reviewed. Sinus rhythm.   Assessment/Plan   1. Acute kidney injury superimposed on CKD IIIb; metabolic acidosis - Pt reports remote nephrectomy due to cancer and presents with 2 weeks of general malaise, lightheadedness on standing, and increased creatinine on outpatient blood work  - SCr is 3.84 on admission, up from 1.88 in August 2021; potassium is normal, bicarb 17  - She has 100 protein and large Hgb on dipstick but no RBC on microscopy  - She was given 500 cc saline in ED  - Check FEUrea and serum CK, continue IVF hydration, start oral bicarbonate, renally-dose medications, and repeat chem panel in am    2. Elevated transaminases  - Presents with 2 weeks of general malaise and lightheadedness on standing and is found to have AST 697 and ALT 335  - She denies alcohol use or sick contacts, abdominal exam benign  - Check serum CK, viral hepatitis panel, and [place]; hold statin, repeat CMP in am    3. Chronic diastolic CHF; cardiac amyloidosis   - Appears compensated  - Hold diuretic while hydrating  with IVF, continue tafamidis    4. Hypothyroidism    - Continue Synthroid    5. History of CVA  - Continue ASA and Plavix, hold statin in light of acute transaminase elevation    ADDENDUM: HS troponin came back at 269. Patient not having any chest pain or SOB. No acute ischemic features  noted on EKG. Plan to trend, continue antiplatelets.    DVT prophylaxis: sq heparin  Code Status: Full  Family Communication: Daughter updated at bedside Disposition Plan:  Patient is from: Home  Anticipated d/c is to: Home  Anticipated d/c date is: Possibly as early as 06/06/20 Patient currently: Pending further workup of renal dysfunction and elevated LFTs with [place] and additional labs  Consults called: None  Admission status: Observation     Vianne Bulls, MD [facility]  06/05/2020, 10:55 PM

## 2020-06-05 NOTE — ED Notes (Signed)
Notified [place] MD of troponin 269

## 2020-06-05 NOTE — ED Triage Notes (Signed)
Pt ED from PCP via EMS.  PT here for elevated creatinine, generalized pain, headache, and lightheadedness

## 2020-06-05 NOTE — ED Provider Notes (Signed)
[facility] EMERGENCY DEPARTMENT Provider Note   CSN: [id number] Arrival date & time: 06/05/20  1929     History Chief Complaint  Patient presents with  . Dizziness    Katie [name] is a 74 y.o. female.  Pt presents to the ED today with not feeling well.  Pt said she saw her pcp today who told her that her Cr was elevated.  She does not know the number.  Pt does only have 1 kidney due to a right sided nephrectomy several years ago.  She does have a hx of CKD stage 3.  Pt said she feels weak and tired.  No f/c.  No sob.  No cp.        Past Medical History:  Diagnosis Date  . 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 Cancer [facility])    kidney cancer  . Chronic combined systolic and diastolic CHF (congestive heart failure) ([place])   . 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 Dyspnea   . Headache   . History of kidney cancer    a. s/p right nephrectomy ~2007.  Marland Kitchen History of loop recorder   . Hypercholesterolemia   . Hypertension   . Hypothyroidism   . Mitral regurgitation   . Pulmonary hypertension ([place])   . Stroke [facility])    no residual    Patient Active Problem List   Diagnosis Date Noted  . Acute renal failure superimposed on stage 3b chronic kidney disease ([place]) 06/05/2020  . History of stroke 02/06/2018  . 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 Severe mitral regurgitation 11/30/2017  . 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 Myopericarditis 09/06/2017  . Non-rheumatic mitral regurgitation   . Chronic diastolic congestive heart failure ([place]) 05/08/2017  . CHF exacerbation ([place]) 05/08/2017  . Chronic migraine without aura without status migrainosus, not intractable   . Hypertension 02/15/2017  . Acute on chronic diastolic CHF (congestive heart failure) ([place]) 02/15/2017  . Basilar artery occlusion   . Cerebellar stroke ([place]) 02/10/2017  . Headache 02/10/2017  . Chest  pain   . Mitral regurgitation 01/08/2016  . Moderate tricuspid regurgitation 01/08/2016  . Pulmonary HTN ([place]) 01/08/2016  . Acute CHF (congestive heart failure) ([place]) 01/07/2016  . Elevated troponin 01/07/2016  . Mild hypertension 01/07/2016  . CKD (chronic kidney disease), stage III ([place]) 01/07/2016  . Hyperlipidemia 01/07/2016  . Acute CHF ([place]) 01/07/2016    Past Surgical History:  Procedure Laterality Date  . BREAST EXCISIONAL BIOPSY Left   . BREAST EXCISIONAL BIOPSY Right   . CARDIAC CATHETERIZATION N/A 01/12/2016   Procedure: Right/Left Heart Cath and Coronary/Graft Angiography;  Surgeon: Belva Crome, MD;  Location: [facility] CV [facility];  Service: Cardiovascular;  Laterality: N/A;  . CERVICAL POLYPECTOMY N/A 04/23/2013   Procedure: CERVICAL POLYPECTOMY;  Surgeon: Maeola Sarah. Landry Mellow, MD;  Location: [facility];  Service: Gynecology;  Laterality: N/A;  Possible Polypectomy  . HYSTEROSCOPY WITH D & C N/A 04/23/2013   Procedure: DILATATION AND CURETTAGE /HYSTEROSCOPY;  Surgeon: Maeola Sarah. Landry Mellow, MD;  Location: [facility];  Service: Gynecology;  Laterality: N/A;  . IR ANGIO INTRA EXTRACRAN SEL COM CAROTID INNOMINATE BILAT MOD SED  02/11/2017  . IR ANGIO VERTEBRAL SEL VERTEBRAL BILAT MOD SED  02/11/2017  . KNEE ARTHROSCOPY Left   . LOOP RECORDER INSERTION N/A 02/18/2017   Procedure: Loop Recorder Insertion;  Surgeon: Constance Haw,  MD;  Location: [facility];  Service: Cardiovascular;  Laterality: N/A;  . MITRAL VALVE REPAIR  11/30/2017  . MITRAL VALVE REPAIR N/A 11/30/2017   Procedure: MITRAL VALVE REPAIR;  Surgeon: Sherren Mocha, MD;  Location: [facility];  Service: Cardiovascular;  Laterality: N/A;  . NEPHRECTOMY Right    due to carcinoma  . TEE WITHOUT CARDIOVERSION N/A 02/18/2017   Procedure: TRANSESOPHAGEAL ECHOCARDIOGRAM (TEE) WITH LOOP;  Surgeon: Acie Fredrickson Wonda Cheng, MD;  Location: [facility];  Service: Cardiovascular;  Laterality: N/A;     OB History   No obstetric history  on file.     Family History  Problem Relation Age of Onset  . Stroke Brother   . Diabetes Mellitus II Brother   . Diabetes Mellitus II Sister   . Breast cancer Sister   . Colon cancer Sister     Social History   Tobacco Use  . Smoking status: Never Smoker  . Smokeless tobacco: Never Used  Vaping Use  . Vaping Use: Never used  Substance Use Topics  . Alcohol use: No  . Drug use: No    Home Medications Prior to Admission medications   Medication Sig Start Date End Date Taking? Authorizing Provider  acetaminophen (TYLENOL) 500 MG tablet Take 1,000 mg by mouth every 6 (six) hours as needed for headache (pain).   Yes [provider]  amoxicillin (AMOXIL) 500 MG tablet Take 4 tablets (2,000 mg) 1 hour prior to dental procedures. 01/04/18  Yes Eileen Stanford, PA-C  aspirin 81 MG tablet Take 1 tablet (81 mg total) daily by mouth. 06/14/17  Yes Larey Dresser, MD  butalbital-acetaminophen-caffeine (FIORICET, ESGIC) [phone] MG tablet Take 1 tablet by mouth every 12 (twelve) hours as needed for headache. 02/17/17  Yes Eulogio Bear U, DO  carvedilol (COREG) 3.125 MG tablet Take 1 tablet (3.125 mg total) by mouth 2 (two) times daily with a meal. 03/03/20  Yes Larey Dresser, MD  clopidogrel (PLAVIX) 75 MG tablet Take 1 tablet (75 mg total) by mouth daily. 02/13/17  Yes Lady Deutscher, MD  feeding supplement, ENSURE ENLIVE, (ENSURE ENLIVE) LIQD Take 237 mLs by mouth 2 (two) times daily between meals. 02/17/17  Yes Vann, Jessica U, DO  furosemide (LASIX) 20 MG tablet Take 1 tablet (20 mg total) by mouth daily. Patient taking differently: Take 20 mg by mouth every Monday, Wednesday, and Friday.  03/03/20  Yes Larey Dresser, MD  Hypromellose (ARTIFICIAL TEARS OP) Place 1 drop into both eyes daily as needed (dry eyes).   Yes [provider]  levothyroxine (SYNTHROID, LEVOTHROID) 25 MCG tablet Take 25 mcg by mouth daily before breakfast.   Yes [provider]    potassium chloride SA (K-DUR,KLOR-CON) 20 MEQ tablet Take 1 tablet (20 mEq total) by mouth 2 (two) times daily. 09/11/17  Yes Mikhail, Maryann, DO  rosuvastatin (CRESTOR) 40 MG tablet Take 1 tablet (40 mg total) by mouth daily. 01/09/20  Yes Larey Dresser, MD  Tafamidis [facility]) 61 MG CAPS Take 61 mg by mouth daily. 08/22/19  Yes Larey Dresser, MD    Allergies    Tramadol  Review of Systems   Review of Systems  Constitutional: Positive for fatigue.  Neurological: Positive for weakness.  All other systems reviewed and are negative.   Physical Exam Updated Vital Signs BP (!) 149/68 (BP Location: Right Arm)   Pulse 91   Temp 98 F (36.7 C) (Oral)   Resp 18  Ht 5\' 5"  (1.651 m)   Wt 64.9 kg   LMP 02/12/2013   SpO2 97%   BMI 23.80 kg/m   Physical Exam Vitals and nursing note reviewed.  Constitutional:      Appearance: Normal appearance.  HENT:     Head: Normocephalic and atraumatic.     Right Ear: External ear normal.     Left Ear: External ear normal.     Nose: Nose normal.     Mouth/Throat:     Mouth: Mucous membranes are dry.  Eyes:     Extraocular Movements: Extraocular movements intact.     Conjunctiva/sclera: Conjunctivae normal.     Pupils: Pupils are equal, round, and reactive to light.  Cardiovascular:     Rate and Rhythm: Normal rate and regular rhythm.     Pulses: Normal pulses.     Heart sounds: Normal heart sounds.  Pulmonary:     Effort: Pulmonary effort is normal.     Breath sounds: Normal breath sounds.  Abdominal:     General: Abdomen is flat. Bowel sounds are normal.     Palpations: Abdomen is soft.  Musculoskeletal:        General: Normal range of motion.     Cervical back: Normal range of motion and neck supple.  Skin:    General: Skin is warm.     Capillary Refill: Capillary refill takes less than 2 seconds.  Neurological:     General: No focal deficit present.     Mental Status: She is alert and oriented to person, place, and time.   Psychiatric:        Mood and Affect: Mood normal.        Behavior: Behavior normal.     ED Results / Procedures / Treatments   Labs (all labs ordered are listed, but only abnormal results are displayed) Labs Reviewed  COMPREHENSIVE METABOLIC PANEL - Abnormal; Notable for the following components:      Result Value   Chloride 112 (*)    CO2 17 (*)    Glucose, Bld 145 (*)    BUN 41 (*)    Creatinine, Ser 3.84 (*)    Albumin 3.2 (*)    AST 697 (*)    ALT 335 (*)    GFR, Estimated 12 (*)    All other components within normal limits  URINALYSIS, ROUTINE W REFLEX MICROSCOPIC - Abnormal; Notable for the following components:   APPearance HAZY (*)    Hgb urine dipstick LARGE (*)    Protein, ur 100 (*)    Bacteria, UA RARE (*)    All other components within normal limits  CBG MONITORING, ED - Abnormal; Notable for the following components:   Glucose-Capillary 132 (*)    All other components within normal limits  URINE CULTURE  CULTURE, BLOOD (ROUTINE X 2)  CULTURE, BLOOD (ROUTINE X 2)  RESPIRATORY PANEL BY RT PCR (FLU A&B, COVID)  CBC WITH DIFFERENTIAL/PLATELET  LACTIC ACID, PLASMA  TROPONIN I (HIGH SENSITIVITY)    EKG EKG Interpretation  Date/Time:  Thursday June 05 2020 21:10:50 EDT Ventricular Rate:  83 PR Interval:    QRS Duration: 92 QT Interval:  386 QTC Calculation: 454 R Axis:   35 Text Interpretation: Sinus rhythm Low voltage, extremity and precordial leads No significant change since last tracing Confirmed by Isla Pence [phone]) on 06/05/2020 9:23:20 PM   Radiology DG Chest Portable 1 View  Result Date: 06/05/2020 CLINICAL DATA:  Altered mental status EXAM: PORTABLE CHEST  1 VIEW COMPARISON:  11/30/2017 FINDINGS: Heart is borderline in size. Loop recorder device projects over the left heart. Lingular atelectasis or scarring. Right lung clear. No effusions or edema. No acute bony abnormality. IMPRESSION: Lingular atelectasis or scarring.  No active  disease. Electronically Signed   By: Rolm Baptise M.D.   On: 06/05/2020 20:16    Procedures Procedures (including critical care time)  Medications Ordered in ED Medications  sodium chloride 0.9 % bolus 500 mL (500 mLs Intravenous New Bag/Given 06/05/20 2019)    ED Course  I have reviewed the triage vital signs and the nursing notes.  Pertinent labs & imaging results that were available during my care of the patient were reviewed by me and considered in my medical decision making (see chart for details).    MDM Rules/Calculators/A&P                          Most recent labs:   Labs (10/18): K 4.4, creatinine 1.27, LDL 56, HDL 39 Labs (11/18): K 3.9, creatinine 1.43, SPEP negative Labs (2/19): K 3.8, creatinine 1.40, hgb 13.4 Labs (5/19): K 4.1, creatinine 1.4, hgb 10.8 Labs (6/19): K 4.2, creatinine 1.4, hgb 12.4 Labs (7/19): K 4, creatinine 1.38 Labs (1/20): K 3.6, creatinine 1.36 Labs (08/2019): K 3.8 Creatinine 2.17   Pt's Cr today is 3.84.  She is given gentle hydration due to hx of CHF.  Dr. [name] called and said pt had elevated troponins at the office, so troponins were added on.  Pt denies cp.  Pt d/w Dr. Myna Hidalgo (triad) for admission.  Final Clinical Impression(s) / ED Diagnoses Final diagnoses:  Dehydration  Acute kidney injury superimposed on chronic kidney disease ([place])  Single kidney    Rx / DC Orders ED Discharge Orders    None       Isla Pence, MD 06/05/20 2142

## 2020-06-06 ENCOUNTER — Encounter (HOSPITAL_COMMUNITY): Payer: Self-pay | Admitting: Family Medicine

## 2020-06-06 DIAGNOSIS — E039 Hypothyroidism, unspecified: Secondary | ICD-10-CM | POA: Diagnosis present

## 2020-06-06 DIAGNOSIS — Z20822 Contact with and (suspected) exposure to covid-19: Secondary | ICD-10-CM | POA: Diagnosis present

## 2020-06-06 DIAGNOSIS — Z79890 Hormone replacement therapy (postmenopausal): Secondary | ICD-10-CM | POA: Diagnosis not present

## 2020-06-06 DIAGNOSIS — I130 Hypertensive heart and chronic kidney disease with heart failure and stage 1 through stage 4 chronic kidney disease, or unspecified chronic kidney disease: Secondary | ICD-10-CM | POA: Diagnosis present

## 2020-06-06 DIAGNOSIS — Z885 Allergy status to narcotic agent status: Secondary | ICD-10-CM | POA: Diagnosis not present

## 2020-06-06 DIAGNOSIS — R0600 Dyspnea, unspecified: Secondary | ICD-10-CM | POA: Diagnosis not present

## 2020-06-06 DIAGNOSIS — I272 Pulmonary hypertension, unspecified: Secondary | ICD-10-CM | POA: Diagnosis present

## 2020-06-06 DIAGNOSIS — Z7982 Long term (current) use of aspirin: Secondary | ICD-10-CM | POA: Diagnosis not present

## 2020-06-06 DIAGNOSIS — E854 Organ-limited amyloidosis: Secondary | ICD-10-CM | POA: Diagnosis present

## 2020-06-06 DIAGNOSIS — I639 Cerebral infarction, unspecified: Secondary | ICD-10-CM | POA: Diagnosis not present

## 2020-06-06 DIAGNOSIS — Z85528 Personal history of other malignant neoplasm of kidney: Secondary | ICD-10-CM | POA: Diagnosis not present

## 2020-06-06 DIAGNOSIS — Z905 Acquired absence of kidney: Secondary | ICD-10-CM | POA: Diagnosis not present

## 2020-06-06 DIAGNOSIS — M6282 Rhabdomyolysis: Secondary | ICD-10-CM | POA: Diagnosis present

## 2020-06-06 DIAGNOSIS — G629 Polyneuropathy, unspecified: Secondary | ICD-10-CM | POA: Diagnosis present

## 2020-06-06 DIAGNOSIS — I5042 Chronic combined systolic (congestive) and diastolic (congestive) heart failure: Secondary | ICD-10-CM | POA: Diagnosis present

## 2020-06-06 DIAGNOSIS — E872 Acidosis: Secondary | ICD-10-CM | POA: Diagnosis present

## 2020-06-06 DIAGNOSIS — I340 Nonrheumatic mitral (valve) insufficiency: Secondary | ICD-10-CM | POA: Diagnosis not present

## 2020-06-06 DIAGNOSIS — Z833 Family history of diabetes mellitus: Secondary | ICD-10-CM | POA: Diagnosis not present

## 2020-06-06 DIAGNOSIS — Z823 Family history of stroke: Secondary | ICD-10-CM | POA: Diagnosis not present

## 2020-06-06 DIAGNOSIS — R7401 Elevation of levels of liver transaminase levels: Secondary | ICD-10-CM | POA: Diagnosis not present

## 2020-06-06 DIAGNOSIS — I43 Cardiomyopathy in diseases classified elsewhere: Secondary | ICD-10-CM | POA: Diagnosis present

## 2020-06-06 DIAGNOSIS — N1832 Chronic kidney disease, stage 3b: Secondary | ICD-10-CM | POA: Diagnosis present

## 2020-06-06 DIAGNOSIS — N179 Acute kidney failure, unspecified: Secondary | ICD-10-CM | POA: Diagnosis present

## 2020-06-06 DIAGNOSIS — Z803 Family history of malignant neoplasm of breast: Secondary | ICD-10-CM | POA: Diagnosis not present

## 2020-06-06 DIAGNOSIS — E860 Dehydration: Secondary | ICD-10-CM | POA: Diagnosis present

## 2020-06-06 DIAGNOSIS — Z7902 Long term (current) use of antithrombotics/antiplatelets: Secondary | ICD-10-CM | POA: Diagnosis not present

## 2020-06-06 DIAGNOSIS — I5032 Chronic diastolic (congestive) heart failure: Secondary | ICD-10-CM | POA: Diagnosis not present

## 2020-06-06 DIAGNOSIS — T466X5A Adverse effect of antihyperlipidemic and antiarteriosclerotic drugs, initial encounter: Secondary | ICD-10-CM | POA: Diagnosis present

## 2020-06-06 DIAGNOSIS — Z8673 Personal history of transient ischemic attack (TIA), and cerebral infarction without residual deficits: Secondary | ICD-10-CM | POA: Diagnosis not present

## 2020-06-06 DIAGNOSIS — I361 Nonrheumatic tricuspid (valve) insufficiency: Secondary | ICD-10-CM | POA: Diagnosis not present

## 2020-06-06 LAB — COMPREHENSIVE METABOLIC PANEL
ALT: 272 U/L — ABNORMAL HIGH (ref 0–44)
AST: 566 U/L — ABNORMAL HIGH (ref 15–41)
Albumin: 2.6 g/dL — ABNORMAL LOW (ref 3.5–5.0)
Alkaline Phosphatase: 74 U/L (ref 38–126)
Anion gap: 7 (ref 5–15)
BUN: 42 mg/dL — ABNORMAL HIGH (ref 8–23)
CO2: 20 mmol/L — ABNORMAL LOW (ref 22–32)
Calcium: 9.8 mg/dL (ref 8.9–10.3)
Chloride: 117 mmol/L — ABNORMAL HIGH (ref 98–111)
Creatinine, Ser: 3.84 mg/dL — ABNORMAL HIGH (ref 0.44–1.00)
GFR, Estimated: 12 mL/min — ABNORMAL LOW (ref >60)
Glucose, Bld: 128 mg/dL — ABNORMAL HIGH (ref 70–99)
Potassium: 4.3 mmol/L (ref 3.5–5.1)
Sodium: 144 mmol/L (ref 135–145)
Total Bilirubin: 0.7 mg/dL (ref 0.3–1.2)
Total Protein: 6.4 g/dL — ABNORMAL LOW (ref 6.5–8.1)

## 2020-06-06 LAB — CBC
HCT: 41.8 % (ref 36.0–46.0)
HCT: 36.7 % (ref 36.0–46.0)
Hemoglobin: 13 g/dL (ref 12.0–15.0)
Hemoglobin: 11.8 g/dL — ABNORMAL LOW (ref 12.0–15.0)
MCH: 31.6 pg (ref 26.0–34.0)
MCH: 32 pg (ref 26.0–34.0)
MCHC: 31.1 g/dL (ref 30.0–36.0)
MCHC: 32.2 g/dL (ref 30.0–36.0)
MCV: 101.7 fL — ABNORMAL HIGH (ref 80.0–100.0)
MCV: 99.5 fL (ref 80.0–100.0)
Platelets: 262 10*3/uL (ref 150–400)
Platelets: 245 10*3/uL (ref 150–400)
RBC: 4.11 MIL/uL (ref 3.87–5.11)
RBC: 3.69 MIL/uL — ABNORMAL LOW (ref 3.87–5.11)
RDW: 13.5 % (ref 11.5–15.5)
RDW: 13.5 % (ref 11.5–15.5)
WBC: 6.8 10*3/uL (ref 4.0–10.5)
WBC: 6.3 10*3/uL (ref 4.0–10.5)
nRBC: 0 % (ref 0.0–0.2)
nRBC: 0 % (ref 0.0–0.2)

## 2020-06-06 LAB — CK
Total CK: 29145 U/L — ABNORMAL HIGH (ref 38–234)
Total CK: 23477 U/L — ABNORMAL HIGH (ref 38–234)

## 2020-06-06 LAB — HEPATITIS PANEL, ACUTE
HCV Ab: NONREACTIVE
Hep A IgM: NONREACTIVE
Hep B C IgM: NONREACTIVE
Hepatitis B Surface Ag: NONREACTIVE

## 2020-06-06 LAB — SODIUM, URINE, RANDOM: Sodium, Ur: 28 mmol/L

## 2020-06-06 LAB — CREATININE, SERUM
Creatinine, Ser: 3.92 mg/dL — ABNORMAL HIGH (ref 0.44–1.00)
GFR, Estimated: 11 mL/min — ABNORMAL LOW (ref >60)

## 2020-06-06 LAB — URINE CULTURE

## 2020-06-06 LAB — TROPONIN I (HIGH SENSITIVITY): Troponin I (High Sensitivity): 271 ng/L (ref <18)

## 2020-06-06 LAB — PHOSPHORUS: Phosphorus: 3.5 mg/dL (ref 2.5–4.6)

## 2020-06-06 LAB — CREATININE, URINE, RANDOM: Creatinine, Urine: 28.62 mg/dL

## 2020-06-06 MED ORDER — OXYCODONE HCL 5 MG PO TABS: 5.0000 mg | ORAL_TABLET | Freq: Four times a day (QID) | ORAL | Status: DC | PRN

## 2020-06-06 NOTE — Progress Notes (Signed)
Patient reported to RN that they were having chest pain, an EKG was completed and the MD has been notified. RN will continue to monitor this patient and await any new orders placed as there are no new orders at this time. Patient deny's chest pain after completing EKG.

## 2020-06-06 NOTE — ED Notes (Signed)
Report attempted, RN down on lab.

## 2020-06-06 NOTE — ED Notes (Signed)
Pt c/o back pain from chronic "back problems". Tylenol 650mg  PO administered

## 2020-06-06 NOTE — ED Notes (Signed)
Lunch Tray Ordered @ 1021. 

## 2020-06-06 NOTE — Progress Notes (Addendum)
NEW ADMISSION NOTE  Arrival Method: bed Mental Orientation: alert and oriented x4  Telemetry: yes Assessment: Completed Skin: see notes Iv: right upper arm Pain: 5 Tubes: 1 Safety Measures: Safety Fall Prevention Plan has been given, discussed and signed Admission: Completed [facility] Orientation: Patient has been orientated to the room, unit and staff.  Family: 0  Orders have been reviewed and implemented. Will continue to monitor the patient. Call light has been placed within reach and bed alarm has been activated.   Beatris Ship, RN

## 2020-06-06 NOTE — Progress Notes (Signed)
Progress Note    MARSHAE AZAM  KWI:[id number] DOB: 1946-07-31  DOA: 06/05/2020 PCP: Seward Carol, MD    Brief Narrative:    Medical records reviewed and are as summarized below:  Katie [name] is an 74 y.o. female with medical history significant for chronic kidney disease stage IIIb, history of nephrectomy, history of CVA, cardiac amyloidosis and chronic diastolic CHF, and hypothyroidism, now presenting to emergency department for evaluation of general malaise, lightheadedness on standing, and increased creatinine on outpatient blood work.  Patient reports that she saw her PCP for evaluation of the aforementioned symptoms which began approximately 2 weeks ago, have blood work performed, and daughter was notified of her elevated creatinine and recommendation for evaluation in the emergency department.   CK found to be markedly elevated.  Assessment/Plan:   Principal Problem:   Acute renal failure superimposed on stage 3b chronic kidney disease (HCC) Active Problems:   Elevated transaminase level   Chronic diastolic congestive heart failure (HCC)   Amyloid heart disease (HCC)   History of stroke   Hypothyroidism   Acidosis   Rhabdomyolysis    Rhabdomyolysis -hold statin -? etiology -denies viral infection/fall -has cardiac amyloidosis .Marland Kitchen but not systemic so ? Amyloid myopathy?  -IVF and trend CKs  Acute kidney injury superimposed on CKD IIIb; metabolic acidosis - Pt reports remote nephrectomy due to cancer and presents with 2 weeks of general malaise, lightheadedness on standing, and increased creatinine on outpatient blood work  - SCr is 3.84 on admission, up from a baseline of 2; potassium is normal, bicarb 17  - She has 100 protein and large Hgb on dipstick but no RBC on microscopy  - IVF and trend   Elevated transaminases  - Presents with 2 weeks of general malaise and lightheadedness on standing and is found to have AST 697 and ALT 335  -suspect due to  rhabdomyolysis -trend -RUQ with ? cirrhosis   Chronic diastolic CHF; cardiac amyloidosis   - Appears compensated  - Hold diuretic while hydrating with IVF, continue tafamidis    Hypothyroidism    - Continue Synthroid    History of CVA  - Continue ASA and Plavix, hold statin in light of acute transaminase elevation     Family Communication/Anticipated D/C date and plan/Code Status   DVT prophylaxis: heparin Code Status: Full Code.   Disposition Plan: Status is: inpt    Dispo: The patient is from: Home              Anticipated d/c is to: Home              Anticipated d/c date is: 3 days              Patient currently is not medically stable to d/c.         Medical Consultants:    None.     Subjective:   Denies falls or fevers  Objective:    Vitals:   06/06/20 1100 06/06/20 1115 06/06/20 1154 06/06/20 1230  BP: 100/60 93/69 115/67   Pulse: 63 65 71   Resp: 20 20 17    Temp: 98.2 F (36.8 C)  98.3 F (36.8 C)   TempSrc:   Oral   SpO2: 99% 99% 99%   Weight:    68.6 kg  Height:    5\' 3"  (1.6 m)    Intake/Output Summary (Last 24 hours) at 06/06/2020 1645 Last data filed at 06/06/2020 1247 Gross per 24 hour  Intake  1407.93 ml  Output --  Net 1407.93 ml   Filed Weights   06/05/20 1940 06/06/20 0936 06/06/20 1230  Weight: 64.9 kg 64.9 kg 68.6 kg    Exam:  General: Appearance:     Overweight female in no acute distress     Lungs:     respirations unlabored  Heart:    Normal heart rate. Normal rhythm.    MS:   All extremities are intact.   Neurologic:   Awake, alert, oriented x 3. No apparent focal neurological           defect.     Data Reviewed:   I have personally reviewed following labs and imaging studies:  Labs: Labs show the following:   Basic Metabolic Panel: Recent Labs  Lab 06/05/20 1943 06/05/20 2236 06/06/20 0235  NA 141  --  144  K 4.4  --  4.3  CL 112*  --  117*  CO2 17*  --  20*  GLUCOSE 145*  --  128*  BUN  41*  --  42*  CREATININE 3.84* 3.92* 3.84*  CALCIUM 10.3  --  9.8  PHOS  --   --  3.5   GFR Estimated Creatinine Clearance: 11.8 mL/min (A) (by C-G formula based on SCr of 3.84 mg/dL (H)). Liver Function Tests: Recent Labs  Lab 06/05/20 1943 06/06/20 0235  AST 697* 566*  ALT 335* 272*  ALKPHOS 93 74  BILITOT 0.8 0.7  PROT 7.5 6.4*  ALBUMIN 3.2* 2.6*   No results for input(s): LIPASE, AMYLASE in the last 168 hours. No results for input(s): AMMONIA in the last 168 hours. Coagulation profile No results for input(s): INR, PROTIME in the last 168 hours.  CBC: Recent Labs  Lab 06/05/20 1943 06/05/20 2236 06/06/20 0235  WBC 8.0 6.8 6.3  NEUTROABS 5.8  --   --   HGB 13.4 13.0 11.8*  HCT 42.6 41.8 36.7  MCV 99.3 101.7* 99.5  PLT 270 262 245   Cardiac Enzymes: Recent Labs  Lab 06/05/20 2236 06/06/20 0923  CKTOTAL 29,145* 23,477*   BNP (last 3 results) No results for input(s): PROBNP in the last 8760 hours. CBG: Recent Labs  Lab 06/05/20 2018  GLUCAP 132*   D-Dimer: No results for input(s): DDIMER in the last 72 hours. Hgb A1c: No results for input(s): HGBA1C in the last 72 hours. Lipid Profile: No results for input(s): CHOL, HDL, LDLCALC, TRIG, CHOLHDL, LDLDIRECT in the last 72 hours. Thyroid function studies: No results for input(s): TSH, T4TOTAL, T3FREE, THYROIDAB in the last 72 hours.  Invalid input(s): FREET3 Anemia work up: No results for input(s): VITAMINB12, FOLATE, FERRITIN, TIBC, IRON, RETICCTPCT in the last 72 hours. Sepsis Labs: Recent Labs  Lab 06/05/20 1943 06/05/20 2236 06/06/20 0235  WBC 8.0 6.8 6.3  LATICACIDVEN 1.1  --   --     Microbiology Recent Results (from the past 240 hour(s))  Culture, blood (routine x 2)     Status: None (Preliminary result)   Collection Time: 06/05/20  7:43 PM   Specimen: BLOOD  Result Value Ref Range Status   Specimen Description BLOOD SITE NOT SPECIFIED  Final   Special Requests   Final    BOTTLES  DRAWN AEROBIC AND ANAEROBIC Blood Culture results may not be optimal due to an inadequate volume of blood received in culture bottles   Culture   Final    NO GROWTH < 12 HOURS Performed at [place],  [place] [address]    Report Status PENDING  Incomplete  Respiratory Panel by RT PCR (Flu A&B, Covid) - Nasopharyngeal Swab     Status: None   Collection Time: 06/05/20  7:43 PM   Specimen: Nasopharyngeal Swab  Result Value Ref Range Status   SARS Coronavirus 2 by RT PCR NEGATIVE NEGATIVE Final    Comment: (NOTE) SARS-CoV-2 target nucleic acids are NOT DETECTED.  The SARS-CoV-2 RNA is generally detectable in upper respiratoy specimens during the acute phase of infection. The lowest concentration of SARS-CoV-2 viral copies this assay can detect is 131 copies/mL. A negative result does not preclude SARS-Cov-2 infection and should not be used as the sole basis for treatment or other patient management decisions. A negative result may occur with  improper specimen collection/handling, submission of specimen other than nasopharyngeal swab, presence of viral mutation(s) within the areas targeted by this assay, and inadequate number of viral copies (<131 copies/mL). A negative result must be combined with clinical observations, patient history, and epidemiological information. The expected result is Negative.  Fact Sheet for Patients:  PinkCheek.be  Fact Sheet for Healthcare Providers:  GravelBags.it  This test is no t yet approved or cleared by the [place] FDA and  has been authorized for detection and/or diagnosis of SARS-CoV-2 by FDA under an Emergency Use Authorization (EUA). This EUA will remain  in effect (meaning this test can be used) for the duration of the COVID-19 declaration under Section 564(b)(1) of the Act, 21 U.S.C. section 360bbb-3(b)(1), unless the authorization is terminated  or revoked sooner.     Influenza A by PCR NEGATIVE NEGATIVE Final   Influenza B by PCR NEGATIVE NEGATIVE Final    Comment: (NOTE) The Xpert Xpress SARS-CoV-2/FLU/RSV assay is intended as an aid in  the diagnosis of influenza from Nasopharyngeal swab specimens and  should not be used as a sole basis for treatment. Nasal washings and  aspirates are unacceptable for Xpert Xpress SARS-CoV-2/FLU/RSV  testing.  Fact Sheet for Patients: PinkCheek.be  Fact Sheet for Healthcare Providers: GravelBags.it  This test is not yet approved or cleared by the [place] FDA and  has been authorized for detection and/or diagnosis of SARS-CoV-2 by  FDA under an Emergency Use Authorization (EUA). This EUA will remain  in effect (meaning this test can be used) for the duration of the  Covid-19 declaration under Section 564(b)(1) of the Act, 21  U.S.C. section 360bbb-3(b)(1), unless the authorization is  terminated or revoked. Performed at [facility], [place] [address] [place], [place] [address]   Culture, blood (Routine X 2) w Reflex to ID Panel     Status: None (Preliminary result)   Collection Time: 06/05/20 11:41 PM   Specimen: BLOOD RIGHT FOREARM  Result Value Ref Range Status   Specimen Description BLOOD RIGHT FOREARM  Final   Special Requests   Final    BOTTLES DRAWN AEROBIC AND ANAEROBIC Blood Culture adequate volume   Culture   Final    NO GROWTH < 12 HOURS Performed at [facility], [place] [address], [place], [place] [address]    Report Status PENDING  Incomplete    Procedures and diagnostic studies:  US Abdomen Complete  Result Date: 06/05/2020 CLINICAL DATA:  Elevated transaminases, stage III B chronic kidney disease, history of renal cell carcinoma post right nephrectomy. EXAM: ABDOMEN ULTRASOUND COMPLETE COMPARISON:  CT abdomen 10/17/2015, CT abdomen and pelvis 09/05/2009 FINDINGS: Gallbladder: No gallstones  or wall thickening visualized. No sonographic Percell Miller  sign noted by sonographer. Common bile duct: Diameter: 6.7 mm, within normal limits for patient age. Liver: Diminished hepatic echogenicity with some mild heterogeneity and a lobular surface contour. No focal liver lesion. No intrahepatic biliary ductal dilatation. Portal vein is patent on color Doppler imaging with normal direction of blood flow towards the liver. IVC: No abnormality visualized. Pancreas: Visualized portion unremarkable. Spleen: A bulbous contour anteriorly is similar to comparison CT imaging. Splenic size is within normal limits. No focal splenic lesion. Right Kidney: Surgically absent. No sonographic abnormality in the right renal fossa. Left Kidney: Length: 10.2 cm. Echogenicity is within normal limits. No concerning renal mass, shadowing calculus or hydronephrosis. Abdominal aorta: No aneurysm visualized. Other findings: None. IMPRESSION: 1. Diminished hepatic echogenicity with some mild heterogeneity and a lobular surface contour. Findings can be seen with intrinsic liver disease/cirrhosis. 2. Status post right nephrectomy. No discernible sonographic abnormality in the right nephrectomy bed. 3. Otherwise unremarkable abdominal ultrasound. Electronically Signed   By: Lovena Le M.D.   On: 06/05/2020 23:45   DG Chest Portable 1 View  Result Date: 06/05/2020 CLINICAL DATA:  Altered mental status EXAM: PORTABLE CHEST 1 VIEW COMPARISON:  11/30/2017 FINDINGS: Heart is borderline in size. Loop recorder device projects over the left heart. Lingular atelectasis or scarring. Right lung clear. No effusions or edema. No acute bony abnormality. IMPRESSION: Lingular atelectasis or scarring.  No active disease. Electronically Signed   By: Rolm Baptise M.D.   On: 06/05/2020 20:16    Medications:   . aspirin EC  81 mg Oral Daily  . carvedilol  3.125 mg Oral BID WC  . clopidogrel  75 mg Oral Daily  . heparin  5,000 Units Subcutaneous Q8H  .  levothyroxine  25 mcg Oral QAC breakfast  . sodium bicarbonate  650 mg Oral BID  . Tafamidis  61 mg Oral Daily   Continuous Infusions: . sodium chloride 125 mL/hr at 06/06/20 0934     LOS: 0 days   Geradine Girt  [facility]   How to contact the [facility] Attending or Consulting provider [place] or covering provider during after hours [place], for this patient?  1. Check the care team in [facility] and look for a) attending/consulting TR[facility] provider listed and b) the [facility] team listed 2. Log into [email or web address] and use [place]'s universal password to access. If you do not have the password, please contact the hospital operator. 3. Locate the [facility] provider you are looking for under [facility] and page to a number that you can be directly reached. 4. If you still have difficulty reaching the provider, please page the [facility] (Director on Call) for the Hospitalists listed on amion for assistance.  06/06/2020, 4:45 PM

## 2020-06-07 DIAGNOSIS — E039 Hypothyroidism, unspecified: Secondary | ICD-10-CM | POA: Diagnosis not present

## 2020-06-07 DIAGNOSIS — E854 Organ-limited amyloidosis: Secondary | ICD-10-CM | POA: Diagnosis not present

## 2020-06-07 DIAGNOSIS — R7401 Elevation of levels of liver transaminase levels: Secondary | ICD-10-CM | POA: Diagnosis not present

## 2020-06-07 DIAGNOSIS — N179 Acute kidney failure, unspecified: Secondary | ICD-10-CM | POA: Diagnosis not present

## 2020-06-07 LAB — CBC
HCT: 35.2 % — ABNORMAL LOW (ref 36.0–46.0)
Hemoglobin: 11.2 g/dL — ABNORMAL LOW (ref 12.0–15.0)
MCH: 31.3 pg (ref 26.0–34.0)
MCHC: 31.8 g/dL (ref 30.0–36.0)
MCV: 98.3 fL (ref 80.0–100.0)
Platelets: 241 10*3/uL (ref 150–400)
RBC: 3.58 MIL/uL — ABNORMAL LOW (ref 3.87–5.11)
RDW: 13.7 % (ref 11.5–15.5)
WBC: 5.2 10*3/uL (ref 4.0–10.5)
nRBC: 0 % (ref 0.0–0.2)

## 2020-06-07 LAB — COMPREHENSIVE METABOLIC PANEL
ALT: 243 U/L — ABNORMAL HIGH (ref 0–44)
AST: 408 U/L — ABNORMAL HIGH (ref 15–41)
Albumin: 2.4 g/dL — ABNORMAL LOW (ref 3.5–5.0)
Alkaline Phosphatase: 50 U/L (ref 38–126)
Anion gap: 8 (ref 5–15)
BUN: 41 mg/dL — ABNORMAL HIGH (ref 8–23)
CO2: 19 mmol/L — ABNORMAL LOW (ref 22–32)
Calcium: 9 mg/dL (ref 8.9–10.3)
Chloride: 117 mmol/L — ABNORMAL HIGH (ref 98–111)
Creatinine, Ser: 3.65 mg/dL — ABNORMAL HIGH (ref 0.44–1.00)
GFR, Estimated: 12 mL/min — ABNORMAL LOW (ref >60)
Glucose, Bld: 96 mg/dL (ref 70–99)
Potassium: 4.3 mmol/L (ref 3.5–5.1)
Sodium: 144 mmol/L (ref 135–145)
Total Bilirubin: 0.5 mg/dL (ref 0.3–1.2)
Total Protein: 5.9 g/dL — ABNORMAL LOW (ref 6.5–8.1)

## 2020-06-07 LAB — CK: Total CK: 17161 U/L — ABNORMAL HIGH (ref 38–234)

## 2020-06-07 LAB — UREA NITROGEN, URINE: Urea Nitrogen, Ur: 206 mg/dL

## 2020-06-07 MED ORDER — DICLOFENAC SODIUM 1 % EX GEL
2.0000 g | Freq: Four times a day (QID) | CUTANEOUS | Status: DC
  Administered 2020-06-07 – 2020-06-10 (×7): 2 g via TOPICAL
  Filled 2020-06-07: qty 100

## 2020-06-07 NOTE — Evaluation (Signed)
Physical Therapy Evaluation Patient Details Name: Katie [name] MRN: [id number] DOB: [birth year] Today's Date: 06/07/2020   History of Present Illness  74 yo female with onset of light headed feeling and malaise was sent to hosp, admitted and noted elevation of creatinine and CK high.  Dx acute renal failure wiht elevated transaminase, CHF and amyloid disease. Has AKI and rhabdomyolysis.  PMHx: amyloid heart disease, stroke, CHF, CKD 3b, hypothyroidism,   Clinical Impression  Pt was tested for orthostatics, noting sitting BP 116/71, pulse 72, sat 98%;  Standing 131/72, pulse 80, sat 98%.  Initial standing was 122 for test of sit to stand ability.  Pt is unsteady with these findings, may need to be in rehab unless she feels well enough with light headed feeling controlled to try stairs for home.  Follow acutely for these needs.    Follow Up Recommendations SNF    Equipment Recommendations  None recommended by PT    Recommendations for Other Services       Precautions / Restrictions Precautions Precautions: Fall Precaution Comments: monitor vitals Restrictions Weight Bearing Restrictions: No      Mobility  Bed Mobility               General bed mobility comments: up in chair when PT arrived    Transfers Overall transfer level: Needs assistance Equipment used: 1 person hand held assist Transfers: Sit to/from Stand Sit to Stand: Min assist         General transfer comment: min assist to power up   Ambulation/Gait             General Gait Details: unable due to light headedness  Stairs            Wheelchair Mobility    Modified Rankin (Stroke Patients Only)       Balance Overall balance assessment: Needs assistance Sitting-balance support: Feet supported Sitting balance-Leahy Scale: Good     Standing balance support: Single extremity supported;During functional activity Standing balance-Leahy Scale: Fair Standing balance comment: min assist  to power up but then min guard                             Pertinent Vitals/Pain Pain Assessment: No/denies pain    Home Living Family/patient expects to be discharged to:: Private residence Living Arrangements: Alone Available Help at Discharge: Family;Friend(s);Available 24 hours/day Type of Home: House Home Access: Stairs to enter Entrance Stairs-Rails: Right Entrance Stairs-Number of Steps: 3 Home Layout: One level Home Equipment: Cane - single point Additional Comments: has driven, able to shop and cook, keeps house and does self care    Prior Function Level of Independence: Independent with assistive device(s)               Hand Dominance   Dominant Hand: Right    Extremity/Trunk Assessment   Upper Extremity Assessment Upper Extremity Assessment: Overall WFL for tasks assessed    Lower Extremity Assessment Lower Extremity Assessment: Overall WFL for tasks assessed (except hips 4- strength)    Cervical / Trunk Assessment Cervical / Trunk Assessment: Kyphotic (mild)  Communication   Communication: No difficulties  Cognition Arousal/Alertness: Awake/alert Behavior During Therapy: WFL for tasks assessed/performed Overall Cognitive Status: Within Functional Limits for tasks assessed  General Comments General comments (skin integrity, edema, etc.): Pt was given BP and sat checks for sitting and standing with no unusual readings that would indicate low BP but pulse up to 122 standing    Exercises     Assessment/Plan    PT Assessment Patient needs continued PT services  PT Problem List Decreased strength;Decreased range of motion;Decreased activity tolerance;Decreased balance;Decreased mobility;Cardiopulmonary status limiting activity       PT Treatment Interventions DME instruction;Gait training;Stair training;Functional mobility training;Therapeutic activities;Therapeutic exercise;Balance  training;Neuromuscular re-education;Patient/family education    PT Goals (Current goals can be found in the Care Plan section)  Acute Rehab PT Goals Patient Stated Goal: to be safe and not fall PT Goal Formulation: With patient Time For Goal Achievement: 06/21/20 Potential to Achieve Goals: Good    Frequency Min 3X/week   Barriers to discharge Inaccessible home environment;Decreased caregiver support home alone with stairs to enter    Co-evaluation               AM-PAC PT "6 Clicks" Mobility  Outcome Measure Help needed turning from your back to your side while in a flat bed without using bedrails?: None Help needed moving from lying on your back to sitting on the side of a flat bed without using bedrails?: A Little Help needed moving to and from a bed to a chair (including a wheelchair)?: A Little Help needed standing up from a chair using your arms (e.g., wheelchair or bedside chair)?: A Little Help needed to walk in hospital room?: A Little Help needed climbing 3-5 steps with a railing? : A Lot 6 Click Score: 18    End of Session Equipment Utilized During Treatment: Gait belt Activity Tolerance: Patient tolerated treatment well;Treatment limited secondary to medical complications (Comment) Patient left: in chair;with call bell/phone within reach Nurse Communication: Mobility status PT Visit Diagnosis: Unsteadiness on feet (R26.81);Muscle weakness (generalized) (M62.81);Difficulty in walking, not elsewhere classified (R26.2)    Time: 3343-5686 PT Time Calculation (min) (ACUTE ONLY): 30 min   Charges:   PT Evaluation $PT Eval Moderate Complexity: 1 Mod PT Treatments $Therapeutic Activity: 8-22 mins       Ramond Dial 06/07/2020, 3:31 PM  Mee Hives, PT MS Acute Rehab Dept. Number: [place] and [place]

## 2020-06-07 NOTE — Progress Notes (Signed)
PT Cancellation Note  Patient Details Name: Katie [name] MRN: [id number] DOB: 19-Mar-1946   Cancelled Treatment:    Reason Eval/Treat Not Completed: Other (comment).  Pt is getting bathed, will try again as time and pt allow.   Ramond Dial 06/07/2020, 2:00 PM   Mee Hives, PT MS Acute Rehab Dept. Number: [place] and [place]

## 2020-06-07 NOTE — Progress Notes (Signed)
Progress Note    Katie [name]  SUP:[id number] DOB: 04/08/1946  DOA: 06/05/2020 PCP: Seward Carol, MD    Brief Narrative:    Medical records reviewed and are as summarized below:  Katie [name] is an 74 y.o. female with medical history significant for chronic kidney disease stage IIIb, history of nephrectomy, history of CVA, cardiac amyloidosis and chronic diastolic CHF, and hypothyroidism, now presenting to emergency department for evaluation of general malaise, lightheadedness on standing, and increased creatinine on outpatient blood work.  Patient reports that she saw her PCP for evaluation of the aforementioned symptoms which began approximately 2 weeks ago, have blood work performed, and daughter was notified of her elevated creatinine and recommendation for evaluation in the emergency department.   CK found to be markedly elevated.   Assessment/Plan:   Principal Problem:   Acute renal failure superimposed on stage 3b chronic kidney disease (HCC) Active Problems:   Elevated transaminase level   Chronic diastolic congestive heart failure (HCC)   Amyloid heart disease (HCC)   History of stroke   Hypothyroidism   Acidosis   Rhabdomyolysis    Rhabdomyolysis -hold statin -? etiology -denies viral infection/fall -has cardiac amyloidosis .Marland Kitchen but not systemic so ? Amyloid myopathy?  -IVF and trend CKs  Acute kidney injury superimposed on CKD IIIb; metabolic acidosis - Pt reports remote nephrectomy due to cancer and presents with 2 weeks of general malaise, lightheadedness on standing, and increased creatinine on outpatient blood work  - SCr is 3.84 on admission, up from a baseline of 2 - IVF and trend  Elevated transaminases  - Presents with 2 weeks of general malaise and lightheadedness on standing and is found to have AST 697 and ALT 335  -suspect due to rhabdomyolysis -trend -RUQ with ? cirrhosis   Chronic diastolic CHF; cardiac amyloidosis   - Appears compensated   - Hold diuretic while hydrating with IVF, continue tafamidis    Hypothyroidism    - Continue Synthroid    History of CVA  - Continue ASA and Plavix, hold statin in light of acute transaminase elevation    PT Eval   Family Communication/Anticipated D/C date and plan/Code Status   DVT prophylaxis: heparin Code Status: Full Code.  Disposition Plan: Status is: inpt    Dispo: The patient is from: Home              Anticipated d/c is to: Home              Anticipated d/c date is: 3 days              Patient currently is not medically stable to d/c.         Medical Consultants:    None.     Subjective:   C/o some muscle pain/joint pain but not able to state where exactly other than feet  Objective:    Vitals:   06/06/20 2056 06/07/20 0005 06/07/20 0532 06/07/20 0935  BP: (!) 95/56 102/67 109/63 112/66  Pulse: 63 75 70 74  Resp: 17 18 17 18   Temp: 98.4 F (36.9 C) 98 F (36.7 C) 97.9 F (36.6 C) 98 F (36.7 C)  TempSrc:   Oral Oral  SpO2: 98% 99% 97% 98%  Weight:      Height:        Intake/Output Summary (Last 24 hours) at 06/07/2020 1402 Last data filed at 06/07/2020 0900 Gross per 24 hour  Intake 2755.26 ml  Output 500 ml  Net 2255.26 ml   Filed Weights   06/05/20 1940 06/06/20 0936 06/06/20 1230  Weight: 64.9 kg 64.9 kg 68.6 kg    Exam:   General: Appearance:     Overweight female in no acute distress     Lungs:      respirations unlabored  Heart:    Normal heart rate. Normal rhythm.    MS:   All extremities are intact.  Moves all 4 ext, muscles are not tender to palpation  Neurologic:   Awake, alert- seems to have some memory impairment                       Data Reviewed:   I have personally reviewed following labs and imaging studies:  Labs: Labs show the following:   Basic Metabolic Panel: Recent Labs  Lab 06/05/20 1943 06/05/20 1943 06/05/20 2236 06/06/20 0235 06/07/20 0037  NA 141  --   --  144 144  K 4.4    < >  --  4.3 4.3  CL 112*  --   --  117* 117*  CO2 17*  --   --  20* 19*  GLUCOSE 145*  --   --  128* 96  BUN 41*  --   --  42* 41*  CREATININE 3.84*  --  3.92* 3.84* 3.65*  CALCIUM 10.3  --   --  9.8 9.0  PHOS  --   --   --  3.5  --    < > = values in this interval not displayed.   GFR Estimated Creatinine Clearance: 12.4 mL/min (A) (by C-G formula based on SCr of 3.65 mg/dL (H)). Liver Function Tests: Recent Labs  Lab 06/05/20 1943 06/06/20 0235 06/07/20 0037  AST 697* 566* 408*  ALT 335* 272* 243*  ALKPHOS 93 74 50  BILITOT 0.8 0.7 0.5  PROT 7.5 6.4* 5.9*  ALBUMIN 3.2* 2.6* 2.4*   No results for input(s): LIPASE, AMYLASE in the last 168 hours. No results for input(s): AMMONIA in the last 168 hours. Coagulation profile No results for input(s): INR, PROTIME in the last 168 hours.  CBC: Recent Labs  Lab 06/05/20 1943 06/05/20 2236 06/06/20 0235 06/07/20 0037  WBC 8.0 6.8 6.3 5.2  NEUTROABS 5.8  --   --   --   HGB 13.4 13.0 11.8* 11.2*  HCT 42.6 41.8 36.7 35.2*  MCV 99.3 101.7* 99.5 98.3  PLT 270 262 245 241   Cardiac Enzymes: Recent Labs  Lab 06/05/20 2236 06/06/20 0923 06/07/20 0037  CKTOTAL 29,145* 23,477* 17,161*   BNP (last 3 results) No results for input(s): PROBNP in the last 8760 hours. CBG: Recent Labs  Lab 06/05/20 2018  GLUCAP 132*   D-Dimer: No results for input(s): DDIMER in the last 72 hours. Hgb A1c: No results for input(s): HGBA1C in the last 72 hours. Lipid Profile: No results for input(s): CHOL, HDL, LDLCALC, TRIG, CHOLHDL, LDLDIRECT in the last 72 hours. Thyroid function studies: No results for input(s): TSH, T4TOTAL, T3FREE, THYROIDAB in the last 72 hours.  Invalid input(s): FREET3 Anemia work up: No results for input(s): VITAMINB12, FOLATE, FERRITIN, TIBC, IRON, RETICCTPCT in the last 72 hours. Sepsis Labs: Recent Labs  Lab 06/05/20 1943 06/05/20 2236 06/06/20 0235 06/07/20 0037  WBC 8.0 6.8 6.3 5.2  LATICACIDVEN 1.1   --   --   --     Microbiology Recent Results (from the past 240 hour(s))  Urine culture  Status: Abnormal   Collection Time: 06/05/20  7:43 PM   Specimen: Urine, Random  Result Value Ref Range Status   Specimen Description URINE, RANDOM  Final   Special Requests   Final    NONE Performed at [facility], [place] [address], [place], [place] [address]    Culture MULTIPLE SPECIES PRESENT, SUGGEST RECOLLECTION (A)  Final   Report Status 06/06/2020 FINAL  Final  Culture, blood (routine x 2)     Status: None (Preliminary result)   Collection Time: 06/05/20  7:43 PM   Specimen: BLOOD  Result Value Ref Range Status   Specimen Description BLOOD SITE NOT SPECIFIED  Final   Special Requests   Final    BOTTLES DRAWN AEROBIC AND ANAEROBIC Blood Culture results may not be optimal due to an inadequate volume of blood received in culture bottles   Culture   Final    NO GROWTH 2 DAYS Performed at [facility], [place] [address] [place],  [address]    Report Status PENDING  Incomplete  Respiratory Panel by RT PCR (Flu A&B, Covid) - Nasopharyngeal Swab     Status: None   Collection Time: 06/05/20  7:43 PM   Specimen: Nasopharyngeal Swab  Result Value Ref Range Status   SARS Coronavirus 2 by RT PCR NEGATIVE NEGATIVE Final    Comment: (NOTE) SARS-CoV-2 target nucleic acids are NOT DETECTED.  The SARS-CoV-2 RNA is generally detectable in upper respiratoy specimens during the acute phase of infection. The lowest concentration of SARS-CoV-2 viral copies this assay can detect is 131 copies/mL. A negative result does not preclude SARS-Cov-2 infection and should not be used as the sole basis for treatment or other patient management decisions. A negative result may occur with  improper specimen collection/handling, submission of specimen other than nasopharyngeal swab, presence of viral mutation(s) within the areas targeted by this assay, and inadequate number of viral  copies (<131 copies/mL). A negative result must be combined with clinical observations, patient history, and epidemiological information. The expected result is Negative.  Fact Sheet for Patients:  PinkCheek.be  Fact Sheet for Healthcare Providers:  GravelBags.it  This test is no t yet approved or cleared by the [place] FDA and  has been authorized for detection and/or diagnosis of SARS-CoV-2 by FDA under an Emergency Use Authorization (EUA). This EUA will remain  in effect (meaning this test can be used) for the duration of the COVID-19 declaration under Section 564(b)(1) of the Act, 21 U.S.C. section 360bbb-3(b)(1), unless the authorization is terminated or revoked sooner.     Influenza A by PCR NEGATIVE NEGATIVE Final   Influenza B by PCR NEGATIVE NEGATIVE Final    Comment: (NOTE) The Xpert Xpress SARS-CoV-2/FLU/RSV assay is intended as an aid in  the diagnosis of influenza from Nasopharyngeal swab specimens and  should not be used as a sole basis for treatment. Nasal washings and  aspirates are unacceptable for Xpert Xpress SARS-CoV-2/FLU/RSV  testing.  Fact Sheet for Patients: PinkCheek.be  Fact Sheet for Healthcare Providers: GravelBags.it  This test is not yet approved or cleared by the [place] FDA and  has been authorized for detection and/or diagnosis of SARS-CoV-2 by  FDA under an Emergency Use Authorization (EUA). This EUA will remain  in effect (meaning this test can be used) for the duration of the  Covid-19 declaration under Section 564(b)(1) of the Act, 21  U.S.C. section 360bbb-3(b)(1), unless the authorization is  terminated or revoked. Performed at [facility], [place] [address] [place], [place] [address]   Culture, blood (Routine X 2) w Reflex to ID Panel     Status: None (Preliminary result)   Collection Time: 06/05/20  11:41 PM   Specimen: BLOOD RIGHT FOREARM  Result Value Ref Range Status   Specimen Description BLOOD RIGHT FOREARM  Final   Special Requests   Final    BOTTLES DRAWN AEROBIC AND ANAEROBIC Blood Culture adequate volume   Culture   Final    NO GROWTH 2 DAYS Performed at [facility], [place] [address], [place], [place] [address]    Report Status PENDING  Incomplete    Procedures and diagnostic studies:  US Abdomen Complete  Result Date: 06/05/2020 CLINICAL DATA:  Elevated transaminases, stage III B chronic kidney disease, history of renal cell carcinoma post right nephrectomy. EXAM: ABDOMEN ULTRASOUND COMPLETE COMPARISON:  CT abdomen 10/17/2015, CT abdomen and pelvis 09/05/2009 FINDINGS: Gallbladder: No gallstones or wall thickening visualized. No sonographic Murphy sign noted by sonographer. Common bile duct: Diameter: 6.7 mm, within normal limits for patient age. Liver: Diminished hepatic echogenicity with some mild heterogeneity and a lobular surface contour. No focal liver lesion. No intrahepatic biliary ductal dilatation. Portal vein is patent on color Doppler imaging with normal direction of blood flow towards the liver. IVC: No abnormality visualized. Pancreas: Visualized portion unremarkable. Spleen: A bulbous contour anteriorly is similar to comparison CT imaging. Splenic size is within normal limits. No focal splenic lesion. Right Kidney: Surgically absent. No sonographic abnormality in the right renal fossa. Left Kidney: Length: 10.2 cm. Echogenicity is within normal limits. No concerning renal mass, shadowing calculus or hydronephrosis. Abdominal aorta: No aneurysm visualized. Other findings: None. IMPRESSION: 1. Diminished hepatic echogenicity with some mild heterogeneity and a lobular surface contour. Findings can be seen with intrinsic liver disease/cirrhosis. 2. Status post right nephrectomy. No discernible sonographic abnormality in the right nephrectomy bed. 3. Otherwise  unremarkable abdominal ultrasound. Electronically Signed   By: Lovena Le M.D.   On: 06/05/2020 23:45   DG Chest Portable 1 View  Result Date: 06/05/2020 CLINICAL DATA:  Altered mental status EXAM: PORTABLE CHEST 1 VIEW COMPARISON:  11/30/2017 FINDINGS: Heart is borderline in size. Loop recorder device projects over the left heart. Lingular atelectasis or scarring. Right lung clear. No effusions or edema. No acute bony abnormality. IMPRESSION: Lingular atelectasis or scarring.  No active disease. Electronically Signed   By: Rolm Baptise M.D.   On: 06/05/2020 20:16    Medications:   . aspirin EC  81 mg Oral Daily  . carvedilol  3.125 mg Oral BID WC  . clopidogrel  75 mg Oral Daily  . diclofenac Sodium  2 g Topical QID  . heparin  5,000 Units Subcutaneous Q8H  . levothyroxine  25 mcg Oral QAC breakfast  . sodium bicarbonate  650 mg Oral BID  . Tafamidis  61 mg Oral Daily   Continuous Infusions: . sodium chloride 125 mL/hr at 06/07/20 0537     LOS: 1 day   Geradine Girt  [facility]   How to contact the [facility] Attending or Consulting provider [place] or covering provider during after hours [place], for this patient?  1. Check the care team in [facility] and look for a) attending/consulting TRH provider listed and b) the [facility] team listed 2. Log into [email or web address] and use [place]'s universal password to access. If you do not have the password, please contact the hospital operator. 3. Locate  the [facility] provider you are looking for under [facility] and page to a number that you can be directly reached. 4. If you still have difficulty reaching the provider, please page the [facility] (Director on Call) for the Hospitalists listed on amion for assistance.  06/07/2020, 2:02 PM

## 2020-06-07 NOTE — Progress Notes (Signed)
Patient brought her home medication - Vyndamax.  Our pharmacy does not stock.  Medication taken to pharmacy to store.  Pharmacy will package each individual dose and send via Tube station when due.  Patient educated on process.  Earleen Reaper RN

## 2020-06-08 DIAGNOSIS — M6282 Rhabdomyolysis: Secondary | ICD-10-CM | POA: Diagnosis not present

## 2020-06-08 DIAGNOSIS — N179 Acute kidney failure, unspecified: Secondary | ICD-10-CM | POA: Diagnosis not present

## 2020-06-08 DIAGNOSIS — E854 Organ-limited amyloidosis: Secondary | ICD-10-CM | POA: Diagnosis not present

## 2020-06-08 DIAGNOSIS — R7401 Elevation of levels of liver transaminase levels: Secondary | ICD-10-CM | POA: Diagnosis not present

## 2020-06-08 LAB — COMPREHENSIVE METABOLIC PANEL
ALT: 228 U/L — ABNORMAL HIGH (ref 0–44)
AST: 249 U/L — ABNORMAL HIGH (ref 15–41)
Albumin: 2.5 g/dL — ABNORMAL LOW (ref 3.5–5.0)
Alkaline Phosphatase: 58 U/L (ref 38–126)
Anion gap: 8 (ref 5–15)
BUN: 39 mg/dL — ABNORMAL HIGH (ref 8–23)
CO2: 19 mmol/L — ABNORMAL LOW (ref 22–32)
Calcium: 9.2 mg/dL (ref 8.9–10.3)
Chloride: 117 mmol/L — ABNORMAL HIGH (ref 98–111)
Creatinine, Ser: 3.48 mg/dL — ABNORMAL HIGH (ref 0.44–1.00)
GFR, Estimated: 13 mL/min — ABNORMAL LOW (ref >60)
Glucose, Bld: 92 mg/dL (ref 70–99)
Potassium: 4.1 mmol/L (ref 3.5–5.1)
Sodium: 144 mmol/L (ref 135–145)
Total Bilirubin: 0.5 mg/dL (ref 0.3–1.2)
Total Protein: 6.2 g/dL — ABNORMAL LOW (ref 6.5–8.1)

## 2020-06-08 LAB — CBC
HCT: 34.5 % — ABNORMAL LOW (ref 36.0–46.0)
Hemoglobin: 11.3 g/dL — ABNORMAL LOW (ref 12.0–15.0)
MCH: 31.9 pg (ref 26.0–34.0)
MCHC: 32.8 g/dL (ref 30.0–36.0)
MCV: 97.5 fL (ref 80.0–100.0)
Platelets: 226 10*3/uL (ref 150–400)
RBC: 3.54 MIL/uL — ABNORMAL LOW (ref 3.87–5.11)
RDW: 13.8 % (ref 11.5–15.5)
WBC: 5.9 10*3/uL (ref 4.0–10.5)
nRBC: 0 % (ref 0.0–0.2)

## 2020-06-08 LAB — CK: Total CK: 9051 U/L — ABNORMAL HIGH (ref 38–234)

## 2020-06-08 MED ORDER — SODIUM CHLORIDE 0.9 % IV SOLN: INTRAVENOUS | Status: DC

## 2020-06-08 NOTE — TOC Initial Note (Addendum)
Transition of Care [facility]) - Initial/Assessment Note    Patient Details  Name: Katie [name] MRN: [id number] Date of Birth: Jun 21, 1946  Transition of Care [facility]) CM/SW Contact:    Katie [name], [place] Phone Number: 06/08/2020, 2:51 PM  Clinical Narrative:                 CSW met with pt and daughter, Katie [name], at bedside. They are agreeable to SNF and have no preference besides not wanting [place]. Chart notes she had both pfizer vaccines. Will complete workup, SW will continue to follow.  Insurance auth started  Expected Discharge Plan: Skilled Nursing Facility Barriers to Discharge: Continued Medical Work up, SNF Pending bed offer   Patient Goals and CMS Choice Patient states their goals for this hospitalization and ongoing recovery are:: Pt and daughter agreeable to SNF. CMS Medicare.gov Compare Post Acute Care list provided to:: Patient Choice offered to / list presented to : Patient  Expected Discharge Plan and Services Expected Discharge Plan: [place] Acute Care Choice: [place] arrangements for the past 2 months: Single Family Home                                      Prior Living Arrangements/Services Living arrangements for the past 2 months: Single Family Home Lives with:: Adult Children Patient language and need for interpreter reviewed:: Yes Do you feel safe going back to the place where you live?: Yes      Need for Family Participation in Patient Care: No (Comment) Care giver support system in place?: Yes (comment)   Criminal Activity/Legal Involvement Pertinent to Current Situation/Hospitalization: No - Comment as needed  Activities of Daily Living Home Assistive Devices/Equipment: Cane (specify quad or straight) ADL Screening (condition at time of admission) Patient's cognitive ability adequate to safely complete daily activities?: Yes Is the patient deaf or have difficulty hearing?: No Does the  patient have difficulty seeing, even when wearing glasses/contacts?: No Does the patient have difficulty concentrating, remembering, or making decisions?: No Patient able to express need for assistance with ADLs?: Yes Does the patient have difficulty dressing or bathing?: No Independently performs ADLs?: Yes (appropriate for developmental age) Does the patient have difficulty walking or climbing stairs?: Yes Weakness of Legs: Both Weakness of Arms/Hands: None  Permission Sought/Granted Permission sought to share information with : Family Supports Permission granted to share information with : Yes, Verbal Permission Granted  Share Information with NAME: Katie [name]     Permission granted to share info w Relationship: Daughter  Permission granted to share info w Contact Information: [phone]  Emotional Assessment Appearance:: Appears stated age Attitude/Demeanor/Rapport: Engaged Affect (typically observed): Pleasant Orientation: : Oriented to Self, Oriented to Place, Oriented to  Time, Oriented to Situation Alcohol / Substance Use: Not Applicable Psych Involvement: No (comment)  Admission diagnosis:  Dehydration [E86.0] 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 Rhabdomyolysis 06/06/2020  . Acute renal failure superimposed on stage 3b chronic kidney disease ([place]) 06/05/2020  . Hypothyroidism   . Acidosis   . History of stroke 02/06/2018  . 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 Severe mitral regurgitation 11/30/2017  . Amyloid heart disease ([place])   . Myopericarditis 09/06/2017  . Non-rheumatic mitral regurgitation   . Chronic  diastolic congestive heart failure ([place]) 05/08/2017  . CHF exacerbation ([place]) 05/08/2017  . Chronic migraine without aura without status migrainosus, not intractable   . Hypertension 02/15/2017  . Basilar artery occlusion   . Cerebellar stroke ([place]) 02/10/2017  . Headache 02/10/2017  . Chest pain   . Mitral regurgitation 01/08/2016  . Moderate tricuspid regurgitation 01/08/2016  . Pulmonary HTN ([place]) 01/08/2016  . Acute CHF (congestive heart failure) ([place]) 01/07/2016  . Elevated transaminase level 01/07/2016  . Mild hypertension 01/07/2016  . CKD (chronic kidney disease), stage III ([place]) 01/07/2016  . Hyperlipidemia 01/07/2016  . Acute CHF ([place]) 01/07/2016   PCP:  Katie Carol, MD Pharmacy:   [facility] [place], [place] [address] [place] [address] Phone: [phone] Fax: [phone]  [facility] ([place], [address] [place] [address] Phone: [phone] Fax: [place], [place] [address] Phone: [phone] Fax: [phone]     Social Determinants of Health (SDOH) Interventions    Readmission Risk Interventions No flowsheet data found.

## 2020-06-08 NOTE — NC FL2 (Signed)
[place] LEVEL OF CARE SCREENING TOOL     IDENTIFICATION  Patient Name: Katie [name] Birthdate: 07-Dec-1945 Sex: female Admission Date (Current Location): 06/05/2020  [facility] and [place] Number:  Herbalist and Address:  [facility], [place] [address] [place], [place] [address]      Provider Number: [id number]  Attending Physician Name and Address:  Geradine Girt, DO  Relative Name and Phone Number:  Wilberta Dorvil, [place]    Current Level of Care: Hospital Recommended Level of Care: [place] Prior Approval Number:    Date Approved/Denied:   PASRR Number: [id number]  Discharge Plan: SNF    Current Diagnoses: Patient Active Problem List   Diagnosis Date Noted  . Rhabdomyolysis 06/06/2020  . Acute renal failure superimposed on stage 3b chronic kidney disease ([place]) 06/05/2020  . Hypothyroidism   . Acidosis   . History of stroke 02/06/2018  . 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 Severe mitral regurgitation 11/30/2017  . Amyloid heart disease ([place])   . Myopericarditis 09/06/2017  . Non-rheumatic mitral regurgitation   . Chronic diastolic congestive heart failure ([place]) 05/08/2017  . CHF exacerbation ([place]) 05/08/2017  . Chronic migraine without aura without status migrainosus, not intractable   . Hypertension 02/15/2017  . Basilar artery occlusion   . Cerebellar stroke ([place]) 02/10/2017  . Headache 02/10/2017  . Chest pain   . Mitral regurgitation 01/08/2016  . Moderate tricuspid regurgitation 01/08/2016  . Pulmonary HTN ([place]) 01/08/2016  . Acute CHF (congestive heart failure) ([place]) 01/07/2016  . Elevated transaminase level 01/07/2016  . Mild hypertension 01/07/2016  . CKD (chronic kidney disease), stage III ([place]) 01/07/2016  . Hyperlipidemia 01/07/2016  . Acute CHF ([place]) 01/07/2016    Orientation RESPIRATION BLADDER Height & Weight      Self, Time, Situation, Place  Normal External catheter Weight: 160 lb 11.5 oz (72.9 kg) Height:  5\' 3"  (160 cm)  BEHAVIORAL SYMPTOMS/MOOD NEUROLOGICAL BOWEL NUTRITION STATUS      Continent Diet (See discharge summary)  AMBULATORY STATUS COMMUNICATION OF NEEDS Skin   Limited Assist Verbally Normal                       Personal Care Assistance Level of Assistance  Bathing, Feeding, Dressing Bathing Assistance: Limited assistance Feeding assistance: Independent Dressing Assistance: Limited assistance     Functional Limitations Info  Sight, Hearing, Speech Sight Info: Adequate Hearing Info: Adequate Speech Info: Adequate    SPECIAL CARE FACTORS FREQUENCY  PT (By licensed PT), OT (By licensed OT)     PT Frequency: 5x week OT Frequency: 5x week            Contractures Contractures Info: Not present    Additional Factors Info  Code Status, Allergies Code Status Info: Full Allergies Info: Tramadol           Current Medications (06/08/2020):  This is the current hospital active medication list Current Facility-Administered Medications  Medication Dose Route Frequency Provider Last Rate Last Admin  . 0.9 %  sodium chloride infusion   Intravenous Continuous Eulogio Bear U, DO 100 mL/hr at 06/08/20 1057 New Bag at 06/08/20 1057  . acetaminophen (TYLENOL) tablet 650 mg  650 mg Oral Q6H PRN Opyd, Ilene Qua, MD   650 mg at 06/08/20 1324   Or  . acetaminophen (TYLENOL) suppository 650 mg  650 mg Rectal Q6H PRN Opyd, Christia Reading  S, MD      . aspirin EC tablet 81 mg  81 mg Oral Daily Opyd, Ilene Qua, MD   81 mg at 06/08/20 1051  . carvedilol (COREG) tablet 3.125 mg  3.125 mg Oral BID WC Opyd, Ilene Qua, MD   3.125 mg at 06/08/20 0739  . clopidogrel (PLAVIX) tablet 75 mg  75 mg Oral Daily Opyd, Ilene Qua, MD   75 mg at 06/08/20 1051  . diclofenac Sodium (VOLTAREN) 1 % topical gel 2 g  2 g Topical QID Vann, Marshel Golubski U, DO   2 g at 06/08/20 1340  . heparin injection 5,000 Units   5,000 Units Subcutaneous Q8H Opyd, Ilene Qua, MD   5,000 Units at 06/08/20 1324  . levothyroxine (SYNTHROID) tablet 25 mcg  25 mcg Oral QAC breakfast Opyd, Ilene Qua, MD   25 mcg at 06/08/20 0649  . ondansetron (ZOFRAN) tablet 4 mg  4 mg Oral Q6H PRN Opyd, Ilene Qua, MD       Or  . ondansetron (ZOFRAN) injection 4 mg  4 mg Intravenous Q6H PRN Opyd, Ilene Qua, MD      . senna-docusate (Senokot-S) tablet 1 tablet  1 tablet Oral QHS PRN Opyd, Ilene Qua, MD      . sodium bicarbonate tablet 650 mg  650 mg Oral BID Opyd, Ilene Qua, MD   650 mg at 06/08/20 1051  . Tafamidis CAPS 61 mg  61 mg Oral Daily Opyd, Ilene Qua, MD   61 mg at 06/08/20 1052     Discharge Medications: Please see discharge summary for a list of discharge medications.  Relevant Imaging Results:  Relevant Lab Results:   Additional Information SS# [id number], [id number]

## 2020-06-08 NOTE — Progress Notes (Signed)
Progress Note    Katie [name]  TTS:[id number] DOB: 01/03/1946  DOA: 06/05/2020 PCP: Seward Carol, MD    Brief Narrative:    Medical records reviewed and are as summarized below:  Katie [name] is an 74 y.o. female with medical history significant for chronic kidney disease stage IIIb, history of nephrectomy, history of CVA, cardiac amyloidosis and chronic diastolic CHF, and hypothyroidism, now presenting to emergency department for evaluation of general malaise, lightheadedness on standing, and increased creatinine on outpatient blood work.  Patient reports that she saw her PCP for evaluation of the aforementioned symptoms which began approximately 2 weeks ago, have blood work performed, and daughter was notified of her elevated creatinine and recommendation for evaluation in the emergency department.   CK found to be markedly elevated.   Assessment/Plan:   Principal Problem:   Acute renal failure superimposed on stage 3b chronic kidney disease (HCC) Active Problems:   Elevated transaminase level   Chronic diastolic congestive heart failure (HCC)   Amyloid heart disease (HCC)   History of stroke   Hypothyroidism   Acidosis   Rhabdomyolysis    Rhabdomyolysis -hold statin -? etiology -denies viral infection/fall -IVF and trend CKs -further work up outpatient  Acute kidney injury superimposed on CKD IIIb; metabolic acidosis - Pt reports remote nephrectomy due to cancer and presents with 2 weeks of general malaise, lightheadedness on standing, and increased creatinine on outpatient blood work  - SCr is 3.84 on admission, up from a baseline of 2 - IVF and trend  Elevated transaminases  - Presents with 2 weeks of general malaise and lightheadedness on standing and is found to have AST 697 and ALT 335  -suspect due to rhabdomyolysis -trend -RUQ with ? cirrhosis   Chronic diastolic CHF; cardiac amyloidosis   - Appears compensated  - Hold diuretic while hydrating with  IVF, continue tafamidis    Hypothyroidism    - Continue Synthroid    History of CVA  - Continue ASA and Plavix, hold statin in light of acute transaminase elevation    PT Eval-->SNF   Family Communication/Anticipated D/C date and plan/Code Status   DVT prophylaxis: heparin Code Status: Full Code.  Disposition Plan: Status is: inpt Spoke with daughter on the phone    Dispo: The patient is from: Home              Anticipated d/c is to: Home              Anticipated d/c date is: 3 days              Patient currently is not medically stable to d/c.         Medical Consultants:    None.     Subjective:   C/o dizziness  Objective:    Vitals:   06/07/20 2030 06/08/20 0436 06/08/20 0739 06/08/20 1109  BP: 122/75 108/69 105/64 116/72  Pulse: 75 68 73 68  Resp: 18 18  18   Temp: 98.6 F (37 C) 97.8 F (36.6 C)  97.8 F (36.6 C)  TempSrc: Oral   Oral  SpO2: 96% 97%  99%  Weight: 72.9 kg     Height:        Intake/Output Summary (Last 24 hours) at 06/08/2020 1513 Last data filed at 06/08/2020 1400 Gross per 24 hour  Intake 2091.89 ml  Output 1300 ml  Net 791.89 ml   Filed Weights   06/06/20 0936 06/06/20 1230 06/07/20 2030  Weight:  64.9 kg 68.6 kg 72.9 kg    Exam:   General: Appearance:     Overweight female in no acute distress     Lungs:      respirations unlabored  Heart:    Normal heart rate. Normal rhythm.    MS:   All extremities are intact.   Neurologic:   Awake, alert, oriented x 3. No apparent focal neurological           defect.      Data Reviewed:   I have personally reviewed following labs and imaging studies:  Labs: Labs show the following:   Basic Metabolic Panel: Recent Labs  Lab 06/05/20 1943 06/05/20 1943 06/05/20 2236 06/06/20 0235 06/06/20 0235 06/07/20 0037 06/08/20 1125  NA 141  --   --  144  --  144 144  K 4.4   < >  --  4.3   < > 4.3 4.1  CL 112*  --   --  117*  --  117* 117*  CO2 17*  --   --  20*  --   19* 19*  GLUCOSE 145*  --   --  128*  --  96 92  BUN 41*  --   --  42*  --  41* 39*  CREATININE 3.84*  --  3.92* 3.84*  --  3.65* 3.48*  CALCIUM 10.3  --   --  9.8  --  9.0 9.2  PHOS  --   --   --  3.5  --   --   --    < > = values in this interval not displayed.   GFR Estimated Creatinine Clearance: 13.4 mL/min (A) (by C-G formula based on SCr of 3.48 mg/dL (H)). Liver Function Tests: Recent Labs  Lab 06/05/20 1943 06/06/20 0235 06/07/20 0037 06/08/20 1125  AST 697* 566* 408* 249*  ALT 335* 272* 243* 228*  ALKPHOS 93 74 50 58  BILITOT 0.8 0.7 0.5 0.5  PROT 7.5 6.4* 5.9* 6.2*  ALBUMIN 3.2* 2.6* 2.4* 2.5*   No results for input(s): LIPASE, AMYLASE in the last 168 hours. No results for input(s): AMMONIA in the last 168 hours. Coagulation profile No results for input(s): INR, PROTIME in the last 168 hours.  CBC: Recent Labs  Lab 06/05/20 1943 06/05/20 2236 06/06/20 0235 06/07/20 0037  WBC 8.0 6.8 6.3 5.2  NEUTROABS 5.8  --   --   --   HGB 13.4 13.0 11.8* 11.2*  HCT 42.6 41.8 36.7 35.2*  MCV 99.3 101.7* 99.5 98.3  PLT 270 262 245 241   Cardiac Enzymes: Recent Labs  Lab 06/05/20 2236 06/06/20 0923 06/07/20 0037 06/08/20 1125  CKTOTAL 29,145* 23,477* 17,161* 9,051*   BNP (last 3 results) No results for input(s): PROBNP in the last 8760 hours. CBG: Recent Labs  Lab 06/05/20 2018  GLUCAP 132*   D-Dimer: No results for input(s): DDIMER in the last 72 hours. Hgb A1c: No results for input(s): HGBA1C in the last 72 hours. Lipid Profile: No results for input(s): CHOL, HDL, LDLCALC, TRIG, CHOLHDL, LDLDIRECT in the last 72 hours. Thyroid function studies: No results for input(s): TSH, T4TOTAL, T3FREE, THYROIDAB in the last 72 hours.  Invalid input(s): FREET3 Anemia work up: No results for input(s): VITAMINB12, FOLATE, FERRITIN, TIBC, IRON, RETICCTPCT in the last 72 hours. Sepsis Labs: Recent Labs  Lab 06/05/20 1943 06/05/20 2236 06/06/20 0235  06/07/20 0037  WBC 8.0 6.8 6.3 5.2  LATICACIDVEN 1.1  --   --   --  Microbiology Recent Results (from the past 240 hour(s))  Urine culture     Status: Abnormal   Collection Time: 06/05/20  7:43 PM   Specimen: Urine, Random  Result Value Ref Range Status   Specimen Description URINE, RANDOM  Final   Special Requests   Final    NONE Performed at [facility], [address] [place], [place] [address]    Culture MULTIPLE SPECIES PRESENT, SUGGEST RECOLLECTION (A)  Final   Report Status 06/06/2020 FINAL  Final  Culture, blood (routine x 2)     Status: None (Preliminary result)   Collection Time: 06/05/20  7:43 PM   Specimen: BLOOD  Result Value Ref Range Status   Specimen Description BLOOD SITE NOT SPECIFIED  Final   Special Requests   Final    BOTTLES DRAWN AEROBIC AND ANAEROBIC Blood Culture results may not be optimal due to an inadequate volume of blood received in culture bottles   Culture   Final    NO GROWTH 3 DAYS Performed at [facility], [place] [address], [place], [place] [address]    Report Status PENDING  Incomplete  Respiratory Panel by RT PCR (Flu A&B, Covid) - Nasopharyngeal Swab     Status: None   Collection Time: 06/05/20  7:43 PM   Specimen: Nasopharyngeal Swab  Result Value Ref Range Status   SARS Coronavirus 2 by RT PCR NEGATIVE NEGATIVE Final    Comment: (NOTE) SARS-CoV-2 target nucleic acids are NOT DETECTED.  The SARS-CoV-2 RNA is generally detectable in upper respiratoy specimens during the acute phase of infection. The lowest concentration of SARS-CoV-2 viral copies this assay can detect is 131 copies/mL. A negative result does not preclude SARS-Cov-2 infection and should not be used as the sole basis for treatment or other patient management decisions. A negative result may occur with  improper specimen collection/handling, submission of specimen other than nasopharyngeal swab, presence of viral mutation(s) within the areas targeted by  this assay, and inadequate number of viral copies (<131 copies/mL). A negative result must be combined with clinical observations, patient history, and epidemiological information. The expected result is Negative.  Fact Sheet for Patients:  PinkCheek.be  Fact Sheet for Healthcare Providers:  GravelBags.it  This test is no t yet approved or cleared by the [place] FDA and  has been authorized for detection and/or diagnosis of SARS-CoV-2 by FDA under an Emergency Use Authorization (EUA). This EUA will remain  in effect (meaning this test can be used) for the duration of the COVID-19 declaration under Section 564(b)(1) of the Act, 21 U.S.C. section 360bbb-3(b)(1), unless the authorization is terminated or revoked sooner.     Influenza A by PCR NEGATIVE NEGATIVE Final   Influenza B by PCR NEGATIVE NEGATIVE Final    Comment: (NOTE) The Xpert Xpress SARS-CoV-2/FLU/RSV assay is intended as an aid in  the diagnosis of influenza from Nasopharyngeal swab specimens and  should not be used as a sole basis for treatment. Nasal washings and  aspirates are unacceptable for Xpert Xpress SARS-CoV-2/FLU/RSV  testing.  Fact Sheet for Patients: PinkCheek.be  Fact Sheet for Healthcare Providers: GravelBags.it  This test is not yet approved or cleared by the [place] FDA and  has been authorized for detection and/or diagnosis of SARS-CoV-2 by  FDA under an Emergency Use Authorization (EUA). This EUA will remain  in effect (meaning this test can be used) for the duration of the  Covid-19 declaration under Section 564(b)(1) of the Act, 21  U.S.C. section 360bbb-3(b)(1), unless the authorization is  terminated or revoked. Performed at [facility], [place] [address], [place], [place] [address]   Culture, blood (Routine X 2) w Reflex to ID Panel     Status: None  (Preliminary result)   Collection Time: 06/05/20 11:41 PM   Specimen: BLOOD RIGHT FOREARM  Result Value Ref Range Status   Specimen Description BLOOD RIGHT FOREARM  Final   Special Requests   Final    BOTTLES DRAWN AEROBIC AND ANAEROBIC Blood Culture adequate volume   Culture   Final    NO GROWTH 3 DAYS Performed at [facility], [place] [address], [place], [place] [address]    Report Status PENDING  Incomplete    Procedures and diagnostic studies:  No results found.  Medications:   . aspirin EC  81 mg Oral Daily  . carvedilol  3.125 mg Oral BID WC  . clopidogrel  75 mg Oral Daily  . diclofenac Sodium  2 g Topical QID  . heparin  5,000 Units Subcutaneous Q8H  . levothyroxine  25 mcg Oral QAC breakfast  . sodium bicarbonate  650 mg Oral BID  . Tafamidis  61 mg Oral Daily   Continuous Infusions: . sodium chloride 100 mL/hr at 06/08/20 1057     LOS: 2 days   Geradine Girt  [facility]   How to contact the [facility] Attending or Consulting provider [place] or covering provider during after hours [place], for this patient?  1. Check the care team in [facility] and look for a) attending/consulting TRH provider listed and b) the [facility] team listed 2. Log into [email or web address] and use [place]'s universal password to access. If you do not have the password, please contact the hospital operator. 3. Locate the [facility] provider you are looking for under [facility] and page to a number that you can be directly reached. 4. If you still have difficulty reaching the provider, please page the [facility] (Director on Call) for the Hospitalists listed on amion for assistance.  06/08/2020, 3:13 PM

## 2020-06-09 ENCOUNTER — Inpatient Hospital Stay (HOSPITAL_COMMUNITY): Payer: Medicare Other

## 2020-06-09 ENCOUNTER — Ambulatory Visit (INDEPENDENT_AMBULATORY_CARE_PROVIDER_SITE_OTHER): Payer: Medicare Other

## 2020-06-09 ENCOUNTER — Other Ambulatory Visit: Payer: Self-pay | Admitting: *Deleted

## 2020-06-09 DIAGNOSIS — N179 Acute kidney failure, unspecified: Secondary | ICD-10-CM | POA: Diagnosis not present

## 2020-06-09 DIAGNOSIS — N1832 Chronic kidney disease, stage 3b: Secondary | ICD-10-CM | POA: Diagnosis not present

## 2020-06-09 DIAGNOSIS — I639 Cerebral infarction, unspecified: Secondary | ICD-10-CM

## 2020-06-09 DIAGNOSIS — R7401 Elevation of levels of liver transaminase levels: Secondary | ICD-10-CM | POA: Diagnosis not present

## 2020-06-09 DIAGNOSIS — E854 Organ-limited amyloidosis: Secondary | ICD-10-CM | POA: Diagnosis not present

## 2020-06-09 LAB — CK: Total CK: 5830 U/L — ABNORMAL HIGH (ref 38–234)

## 2020-06-09 MED ORDER — LIDOCAINE 5 % EX PTCH
1.0000 | MEDICATED_PATCH | CUTANEOUS | Status: DC
  Administered 2020-06-09 – 2020-06-17 (×9): 1 via TRANSDERMAL
  Filled 2020-06-09 (×9): qty 1

## 2020-06-09 NOTE — Progress Notes (Addendum)
Progress Note    Katie [name]  BLT:[id number] DOB: [birth year]  DOA: 06/05/2020 PCP: Seward Carol, MD    Brief Narrative:    Medical records reviewed and are as summarized below:  Katie [name] is an 74 y.o. female with medical history significant for chronic kidney disease stage IIIb, history of nephrectomy, history of CVA, cardiac amyloidosis and chronic diastolic CHF, and hypothyroidism, now presenting to emergency department for evaluation of general malaise, lightheadedness on standing, and increased creatinine on outpatient blood work.  Patient reports that she saw her PCP for evaluation of the aforementioned symptoms which began approximately 2 weeks ago, have blood work performed, and daughter was notified of her elevated creatinine and recommendation for evaluation in the emergency department.   CK found to be markedly elevated.   Assessment/Plan:   Principal Problem:   Acute renal failure superimposed on stage 3b chronic kidney disease (HCC) Active Problems:   Elevated transaminase level   Chronic diastolic congestive heart failure (HCC)   Amyloid heart disease (HCC)   History of stroke   Hypothyroidism   Acidosis   Rhabdomyolysis    Rhabdomyolysis -hold statin -? etiology -denies viral infection/fall -IVF and trend CKs -further work up outpatient  Acute kidney injury superimposed on CKD IIIb; metabolic acidosis - Pt reports remote nephrectomy due to cancer and presents with 2 weeks of general malaise, lightheadedness on standing, and increased creatinine on outpatient blood work  - SCr is 3.84 on admission, up from a baseline of 2 - IVF and trend  Elevated transaminases  - Presents with 2 weeks of general malaise and lightheadedness on standing and is found to have AST 697 and ALT 335  -suspected due to rhabdomyolysis -trend -RUQ with ? Cirrhosis- outpatient follow up   Chronic diastolic CHF; cardiac amyloidosis   - Appears compensated  - Hold  diuretic while hydrating with IVF, continue tafamidis    Hypothyroidism    - Continue Synthroid    History of CVA  - Continue ASA and Plavix, hold statin in light of acute transaminase elevation    Headache/dizziness -check CT scan of head   PT Eval-->SNF   Family Communication/Anticipated D/C date and plan/Code Status    DVT prophylaxis: heparin Code Status: Full Code Disposition Plan: Status is: inpt Spoke with daughter on the phone    Dispo: The patient is from: Home              Anticipated d/c is to: snf              Anticipated d/c date is: 3 days              Patient currently is not medically stable to d/c.     Medical Consultants:    None.    Subjective:   Continues to c/o headache and dizziness  Objective:    Vitals:   06/08/20 1750 06/08/20 2022 06/09/20 0609 06/09/20 0932  BP: 111/64 109/68 111/70 111/63  Pulse: 71 76 70 67  Resp: 18 17 18 18   Temp: 97.7 F (36.5 C) 97.6 F (36.4 C) 98.5 F (36.9 C) 97.8 F (36.6 C)  TempSrc: Oral Oral Oral Oral  SpO2: 99% 98% 98% 100%  Weight:      Height:        Intake/Output Summary (Last 24 hours) at 06/09/2020 1327 Last data filed at 06/09/2020 1000 Gross per 24 hour  Intake 2408.21 ml  Output 400 ml  Net 2008.21 ml  Filed Weights   06/06/20 0936 06/06/20 1230 06/07/20 2030  Weight: 64.9 kg 68.6 kg 72.9 kg    Exam:  General: Appearance:     Overweight female in no acute distress     Lungs:     respirations unlabored  Heart:    Normal heart rate. Normal rhythm.    MS:   All extremities are intact.   Neurologic:   Awake, alert      Data Reviewed:   I have personally reviewed following labs and imaging studies:  Labs: Labs show the following:   Basic Metabolic Panel: Recent Labs  Lab 06/05/20 1943 06/05/20 1943 06/05/20 2236 06/06/20 0235 06/06/20 0235 06/07/20 0037 06/08/20 1125  NA 141  --   --  144  --  144 144  K 4.4   < >  --  4.3   < > 4.3 4.1  CL 112*  --    --  117*  --  117* 117*  CO2 17*  --   --  20*  --  19* 19*  GLUCOSE 145*  --   --  128*  --  96 92  BUN 41*  --   --  42*  --  41* 39*  CREATININE 3.84*  --  3.92* 3.84*  --  3.65* 3.48*  CALCIUM 10.3  --   --  9.8  --  9.0 9.2  PHOS  --   --   --  3.5  --   --   --    < > = values in this interval not displayed.   GFR Estimated Creatinine Clearance: 13.4 mL/min (A) (by C-G formula based on SCr of 3.48 mg/dL (H)). Liver Function Tests: Recent Labs  Lab 06/05/20 1943 06/06/20 0235 06/07/20 0037 06/08/20 1125  AST 697* 566* 408* 249*  ALT 335* 272* 243* 228*  ALKPHOS 93 74 50 58  BILITOT 0.8 0.7 0.5 0.5  PROT 7.5 6.4* 5.9* 6.2*  ALBUMIN 3.2* 2.6* 2.4* 2.5*   No results for input(s): LIPASE, AMYLASE in the last 168 hours. No results for input(s): AMMONIA in the last 168 hours. Coagulation profile No results for input(s): INR, PROTIME in the last 168 hours.  CBC: Recent Labs  Lab 06/05/20 1943 06/05/20 2236 06/06/20 0235 06/07/20 0037 06/08/20 1551  WBC 8.0 6.8 6.3 5.2 5.9  NEUTROABS 5.8  --   --   --   --   HGB 13.4 13.0 11.8* 11.2* 11.3*  HCT 42.6 41.8 36.7 35.2* 34.5*  MCV 99.3 101.7* 99.5 98.3 97.5  PLT 270 262 245 241 226   Cardiac Enzymes: Recent Labs  Lab 06/05/20 2236 06/06/20 0923 06/07/20 0037 06/08/20 1125 06/09/20 0247  CKTOTAL 29,145* 23,477* 17,161* 9,051* 5,830*   BNP (last 3 results) No results for input(s): PROBNP in the last 8760 hours. CBG: Recent Labs  Lab 06/05/20 2018  GLUCAP 132*   D-Dimer: No results for input(s): DDIMER in the last 72 hours. Hgb A1c: No results for input(s): HGBA1C in the last 72 hours. Lipid Profile: No results for input(s): CHOL, HDL, LDLCALC, TRIG, CHOLHDL, LDLDIRECT in the last 72 hours. Thyroid function studies: No results for input(s): TSH, T4TOTAL, T3FREE, THYROIDAB in the last 72 hours.  Invalid input(s): FREET3 Anemia work up: No results for input(s): VITAMINB12, FOLATE, FERRITIN, TIBC, IRON,  RETICCTPCT in the last 72 hours. Sepsis Labs: Recent Labs  Lab 06/05/20 1943 06/05/20 1943 06/05/20 2236 06/06/20 0235 06/07/20 0037 06/08/20 1551  WBC 8.0   < >  6.8 6.3 5.2 5.9  LATICACIDVEN 1.1  --   --   --   --   --    < > = values in this interval not displayed.    Microbiology Recent Results (from the past 240 hour(s))  Urine culture     Status: Abnormal   Collection Time: 06/05/20  7:43 PM   Specimen: Urine, Random  Result Value Ref Range Status   Specimen Description URINE, RANDOM  Final   Special Requests   Final    NONE Performed at [facility], [address], [place], [place] [address]    Culture MULTIPLE SPECIES PRESENT, SUGGEST RECOLLECTION (A)  Final   Report Status 06/06/2020 FINAL  Final  Culture, blood (routine x 2)     Status: None (Preliminary result)   Collection Time: 06/05/20  7:43 PM   Specimen: BLOOD  Result Value Ref Range Status   Specimen Description BLOOD SITE NOT SPECIFIED  Final   Special Requests   Final    BOTTLES DRAWN AEROBIC AND ANAEROBIC Blood Culture results may not be optimal due to an inadequate volume of blood received in culture bottles   Culture   Final    NO GROWTH 4 DAYS Performed at [facility], [place] [address], [place], [place] [address]    Report Status PENDING  Incomplete  Respiratory Panel by RT PCR (Flu A&B, Covid) - Nasopharyngeal Swab     Status: None   Collection Time: 06/05/20  7:43 PM   Specimen: Nasopharyngeal Swab  Result Value Ref Range Status   SARS Coronavirus 2 by RT PCR NEGATIVE NEGATIVE Final    Comment: (NOTE) SARS-CoV-2 target nucleic acids are NOT DETECTED.  The SARS-CoV-2 RNA is generally detectable in upper respiratoy specimens during the acute phase of infection. The lowest concentration of SARS-CoV-2 viral copies this assay can detect is 131 copies/mL. A negative result does not preclude SARS-Cov-2 infection and should not be used as the sole basis for treatment or other patient  management decisions. A negative result may occur with  improper specimen collection/handling, submission of specimen other than nasopharyngeal swab, presence of viral mutation(s) within the areas targeted by this assay, and inadequate number of viral copies (<131 copies/mL). A negative result must be combined with clinical observations, patient history, and epidemiological information. The expected result is Negative.  Fact Sheet for Patients:  PinkCheek.be  Fact Sheet for Healthcare Providers:  GravelBags.it  This test is no t yet approved or cleared by the [place] FDA and  has been authorized for detection and/or diagnosis of SARS-CoV-2 by FDA under an Emergency Use Authorization (EUA). This EUA will remain  in effect (meaning this test can be used) for the duration of the COVID-19 declaration under Section 564(b)(1) of the Act, 21 U.S.C. section 360bbb-3(b)(1), unless the authorization is terminated or revoked sooner.     Influenza A by PCR NEGATIVE NEGATIVE Final   Influenza B by PCR NEGATIVE NEGATIVE Final    Comment: (NOTE) The Xpert Xpress SARS-CoV-2/FLU/RSV assay is intended as an aid in  the diagnosis of influenza from Nasopharyngeal swab specimens and  should not be used as a sole basis for treatment. Nasal washings and  aspirates are unacceptable for Xpert Xpress SARS-CoV-2/FLU/RSV  testing.  Fact Sheet for Patients: PinkCheek.be  Fact Sheet for Healthcare Providers: GravelBags.it  This test is not yet approved or cleared by the [place] FDA and  has been authorized for detection and/or diagnosis of SARS-CoV-2 by  FDA under an Emergency Use Authorization (EUA). This EUA will remain  in effect (meaning this test can be used) for the duration of the  Covid-19 declaration under Section 564(b)(1) of the Act, 21  U.S.C. section 360bbb-3(b)(1),  unless the authorization is  terminated or revoked. Performed at [facility], [place] [address], [place], [place] [address]   Culture, blood (Routine X 2) w Reflex to ID Panel     Status: None (Preliminary result)   Collection Time: 06/05/20 11:41 PM   Specimen: BLOOD RIGHT FOREARM  Result Value Ref Range Status   Specimen Description BLOOD RIGHT FOREARM  Final   Special Requests   Final    BOTTLES DRAWN AEROBIC AND ANAEROBIC Blood Culture adequate volume   Culture   Final    NO GROWTH 4 DAYS Performed at [facility], [place] [address], [place], [place] [address]    Report Status PENDING  Incomplete    Procedures and diagnostic studies:  No results found.  Medications:   . aspirin EC  81 mg Oral Daily  . carvedilol  3.125 mg Oral BID WC  . clopidogrel  75 mg Oral Daily  . diclofenac Sodium  2 g Topical QID  . heparin  5,000 Units Subcutaneous Q8H  . levothyroxine  25 mcg Oral QAC breakfast  . lidocaine  1 patch Transdermal Q24H  . sodium bicarbonate  650 mg Oral BID  . Tafamidis  61 mg Oral Daily   Continuous Infusions: . sodium chloride 50 mL/hr at 06/09/20 1000     LOS: 3 days   Geradine Girt  [facility]   How to contact the [facility] Attending or Consulting provider [place] or covering provider during after hours [place], for this patient?  1. Check the care team in [facility] and look for a) attending/consulting TRH provider listed and b) the [facility] team listed 2. Log into [email or web address] and use [place]'s universal password to access. If you do not have the password, please contact the hospital operator. 3. Locate the [facility] provider you are looking for under [facility] and page to a number that you can be directly reached. 4. If you still have difficulty reaching the provider, please page the [facility] (Director on Call) for the Hospitalists listed on amion for assistance.  06/09/2020, 1:27 PM

## 2020-06-09 NOTE — Progress Notes (Signed)
Carelink Summary Report / Loop Recorder 

## 2020-06-09 NOTE — Consult Note (Signed)
   [facility] Inpatient Consult   06/09/2020  Katie [name] 08-Mar-1946 [id number]   Patient is currently active with [facility] for chronic disease management services and education.  Patient has been engaged by a [place] [employer] fundraiser.  Per chart review, current recommendation is for skilled nursing facility. Will update community RN and continue to follow for progression.  Of note, [facility] Care Management services does not replace or interfere with any services that are arranged by inpatient case management or social work.  [name], MSN, [facility] Liaison Nurse Mobile Phone [phone]  Toll free office [phone]

## 2020-06-09 NOTE — Patient Outreach (Signed)
[facility]) Care Management  06/09/2020  NEA GITTENS 01-17-1946 [id number]   RN Health Coach Hospitalization  Referral Date: 02/11/2019 Referral Source: Transfer from [place] Reason for Referral: Continued Disease Management Education Insurance:[employer] Medicare   Outreach Attempt:  Patient admitted to [place] updated [facility] Liaison of patients admission.  Plan:  RN Health Coach will await Hospital Liaisons discharge follow up recommendations.   [place] [phone] [email or web address]

## 2020-06-10 DIAGNOSIS — E854 Organ-limited amyloidosis: Secondary | ICD-10-CM | POA: Diagnosis not present

## 2020-06-10 DIAGNOSIS — M6282 Rhabdomyolysis: Secondary | ICD-10-CM | POA: Diagnosis not present

## 2020-06-10 DIAGNOSIS — R7401 Elevation of levels of liver transaminase levels: Secondary | ICD-10-CM | POA: Diagnosis not present

## 2020-06-10 DIAGNOSIS — N179 Acute kidney failure, unspecified: Secondary | ICD-10-CM | POA: Diagnosis not present

## 2020-06-10 LAB — TROPONIN I (HIGH SENSITIVITY)
Troponin I (High Sensitivity): 101 ng/L (ref <18)
Troponin I (High Sensitivity): 88 ng/L — ABNORMAL HIGH (ref <18)

## 2020-06-10 LAB — COMPREHENSIVE METABOLIC PANEL
ALT: 147 U/L — ABNORMAL HIGH (ref 0–44)
AST: 115 U/L — ABNORMAL HIGH (ref 15–41)
Albumin: 2.2 g/dL — ABNORMAL LOW (ref 3.5–5.0)
Alkaline Phosphatase: 47 U/L (ref 38–126)
Anion gap: 7 (ref 5–15)
BUN: 34 mg/dL — ABNORMAL HIGH (ref 8–23)
CO2: 18 mmol/L — ABNORMAL LOW (ref 22–32)
Calcium: 8.8 mg/dL — ABNORMAL LOW (ref 8.9–10.3)
Chloride: 119 mmol/L — ABNORMAL HIGH (ref 98–111)
Creatinine, Ser: 3.24 mg/dL — ABNORMAL HIGH (ref 0.44–1.00)
GFR, Estimated: 14 mL/min — ABNORMAL LOW (ref >60)
Glucose, Bld: 92 mg/dL (ref 70–99)
Potassium: 3.8 mmol/L (ref 3.5–5.1)
Sodium: 144 mmol/L (ref 135–145)
Total Bilirubin: 0.6 mg/dL (ref 0.3–1.2)
Total Protein: 5.5 g/dL — ABNORMAL LOW (ref 6.5–8.1)

## 2020-06-10 LAB — CBC
HCT: 34 % — ABNORMAL LOW (ref 36.0–46.0)
Hemoglobin: 10.9 g/dL — ABNORMAL LOW (ref 12.0–15.0)
MCH: 31.7 pg (ref 26.0–34.0)
MCHC: 32.1 g/dL (ref 30.0–36.0)
MCV: 98.8 fL (ref 80.0–100.0)
Platelets: 234 10*3/uL (ref 150–400)
RBC: 3.44 MIL/uL — ABNORMAL LOW (ref 3.87–5.11)
RDW: 14 % (ref 11.5–15.5)
WBC: 4.4 10*3/uL (ref 4.0–10.5)
nRBC: 0 % (ref 0.0–0.2)

## 2020-06-10 LAB — CULTURE, BLOOD (ROUTINE X 2)
Culture: NO GROWTH
Culture: NO GROWTH
Special Requests: ADEQUATE

## 2020-06-10 LAB — CK: Total CK: 3818 U/L — ABNORMAL HIGH (ref 38–234)

## 2020-06-10 MED ORDER — MECLIZINE HCL 25 MG PO TABS
12.5000 mg | ORAL_TABLET | Freq: Three times a day (TID) | ORAL | Status: DC
  Administered 2020-06-10 – 2020-06-11 (×4): 12.5 mg via ORAL
  Filled 2020-06-10 (×4): qty 1

## 2020-06-10 MED ORDER — NITROGLYCERIN 0.4 MG SL SUBL
0.4000 mg | SUBLINGUAL_TABLET | SUBLINGUAL | Status: DC | PRN
  Administered 2020-06-10: 0.4 mg via SUBLINGUAL
  Filled 2020-06-10: qty 1

## 2020-06-10 NOTE — Progress Notes (Addendum)
Progress Note    Katie [name]  GUY:[id number] DOB: 25-Oct-1945  DOA: 06/05/2020 PCP: Seward Carol, MD    Brief Narrative:    Medical records reviewed and are as summarized below:  Katie [name] is an 74 y.o. female with medical history significant for chronic kidney disease stage IIIb, history of nephrectomy, history of CVA, cardiac amyloidosis and chronic diastolic CHF, and hypothyroidism, now presenting to emergency department for evaluation of general malaise, lightheadedness on standing, and increased creatinine on outpatient blood work.  Patient reports that she saw her PCP for evaluation of the aforementioned symptoms which began approximately 2 weeks ago, have blood work performed, and daughter was notified of her elevated creatinine and recommendation for evaluation in the emergency department.   CK found to be markedly elevated.   Assessment/Plan:   Principal Problem:   Acute renal failure superimposed on stage 3b chronic kidney disease (HCC) Active Problems:   Elevated transaminase level   Chronic diastolic congestive heart failure (HCC)   Amyloid heart disease (HCC)   History of stroke   Hypothyroidism   Acidosis   Rhabdomyolysis    Rhabdomyolysis -hold statin -? etiology -denies viral infection/fall -IVF and trend CKs -further work up outpatient- had amyloid so ? If related to that-- ? Need for biopsy once recovered  Acute kidney injury superimposed on CKD IIIb; metabolic acidosis - Pt reports remote nephrectomy due to cancer and presents with 2 weeks of general malaise, lightheadedness on standing, and increased creatinine on outpatient blood work  - SCr is 3.84 on admission, up from a baseline of 2 - slow to improve  Elevated transaminases  - Presents with 2 weeks of general malaise and lightheadedness on standing and is found to have AST 697 and ALT 335  -suspected due to rhabdomyolysis -trend -RUQ with ? Cirrhosis- outpatient follow up   Chronic  diastolic CHF; cardiac amyloidosis   - Appears compensated  - Hold diuretic while hydrating with IVF, continue tafamidis    Hypothyroidism    - Continue Synthroid    History of CVA  - Continue ASA and Plavix, hold statin in light of acute transaminase elevation    Headache/dizziness - CT scan of head w/o new issues -trial of meclizine -BPPV eval with PT in AM  Chest pain -suspect musculoskeletal -CE improved -EKG w/o changes  Will need OSA work up outpatient  PT Eval-->SNF   Family Communication/Anticipated D/C date and plan/Code Status    DVT prophylaxis: heparin Code Status: Full Code Disposition Plan: Status is: inpt Spoke with daughter on the phone    Dispo: The patient is from: Home              Anticipated d/c is to: snf              Anticipated d/c date is: 1-2 days              Patient currently is not medically stable to d/c.     Medical Consultants:    None.    Subjective:   Difficult historian- dizziness has been going on for a long time on and off Also says she wakes up with daily headaches  Objective:    Vitals:   06/10/20 0402 06/10/20 0500 06/10/20 0640 06/10/20 0829  BP: 113/68  128/73 (!) 121/57  Pulse: 69  72 75  Resp: 20  19 18   Temp: 97.6 F (36.4 C)   97.9 F (36.6 C)  TempSrc: Oral   Oral  SpO2: 100%  100% 98%  Weight:  74.3 kg    Height:        Intake/Output Summary (Last 24 hours) at 06/10/2020 1520 Last data filed at 06/10/2020 1238 Gross per 24 hour  Intake 1304.12 ml  Output 2500 ml  Net -1195.88 ml   Filed Weights   06/07/20 2030 06/09/20 2100 06/10/20 0500  Weight: 72.9 kg 72.9 kg 74.3 kg    Exam:  General: Appearance:     Overweight female in no acute distress     Lungs:     Clear to auscultation bilaterally, respirations unlabored  Heart:    Normal heart rate. Normal rhythm.    MS:   All extremities are intact. Min LE edema  Neurologic:   Awake, alert, pleasant and cooperative       Data  Reviewed:   I have personally reviewed following labs and imaging studies:  Labs: Labs show the following:   Basic Metabolic Panel: Recent Labs  Lab 06/05/20 1943 06/05/20 1943 06/05/20 2236 06/06/20 0235 06/06/20 0235 06/07/20 0037 06/07/20 0037 06/08/20 1125 06/10/20 0231  NA 141  --   --  144  --  144  --  144 144  K 4.4   < >  --  4.3   < > 4.3   < > 4.1 3.8  CL 112*  --   --  117*  --  117*  --  117* 119*  CO2 17*  --   --  20*  --  19*  --  19* 18*  GLUCOSE 145*  --   --  128*  --  96  --  92 92  BUN 41*  --   --  42*  --  41*  --  39* 34*  CREATININE 3.84*   < > 3.92* 3.84*  --  3.65*  --  3.48* 3.24*  CALCIUM 10.3  --   --  9.8  --  9.0  --  9.2 8.8*  PHOS  --   --   --  3.5  --   --   --   --   --    < > = values in this interval not displayed.   GFR Estimated Creatinine Clearance: 14.5 mL/min (A) (by C-G formula based on SCr of 3.24 mg/dL (H)). Liver Function Tests: Recent Labs  Lab 06/05/20 1943 06/06/20 0235 06/07/20 0037 06/08/20 1125 06/10/20 0231  AST 697* 566* 408* 249* 115*  ALT 335* 272* 243* 228* 147*  ALKPHOS 93 74 50 58 47  BILITOT 0.8 0.7 0.5 0.5 0.6  PROT 7.5 6.4* 5.9* 6.2* 5.5*  ALBUMIN 3.2* 2.6* 2.4* 2.5* 2.2*   No results for input(s): LIPASE, AMYLASE in the last 168 hours. No results for input(s): AMMONIA in the last 168 hours. Coagulation profile No results for input(s): INR, PROTIME in the last 168 hours.  CBC: Recent Labs  Lab 06/05/20 1943 06/05/20 1943 06/05/20 2236 06/06/20 0235 06/07/20 0037 06/08/20 1551 06/10/20 0231  WBC 8.0   < > 6.8 6.3 5.2 5.9 4.4  NEUTROABS 5.8  --   --   --   --   --   --   HGB 13.4   < > 13.0 11.8* 11.2* 11.3* 10.9*  HCT 42.6   < > 41.8 36.7 35.2* 34.5* 34.0*  MCV 99.3   < > 101.7* 99.5 98.3 97.5 98.8  PLT 270   < > 262 245 241 226 234   < > =  values in this interval not displayed.   Cardiac Enzymes: Recent Labs  Lab 06/06/20 0923 06/07/20 0037 06/08/20 1125 06/09/20 0247  06/10/20 0231  CKTOTAL 23,477* 17,161* 9,051* 5,830* 3,818*   BNP (last 3 results) No results for input(s): PROBNP in the last 8760 hours. CBG: Recent Labs  Lab 06/05/20 2018  GLUCAP 132*   D-Dimer: No results for input(s): DDIMER in the last 72 hours. Hgb A1c: No results for input(s): HGBA1C in the last 72 hours. Lipid Profile: No results for input(s): CHOL, HDL, LDLCALC, TRIG, CHOLHDL, LDLDIRECT in the last 72 hours. Thyroid function studies: No results for input(s): TSH, T4TOTAL, T3FREE, THYROIDAB in the last 72 hours.  Invalid input(s): FREET3 Anemia work up: No results for input(s): VITAMINB12, FOLATE, FERRITIN, TIBC, IRON, RETICCTPCT in the last 72 hours. Sepsis Labs: Recent Labs  Lab 06/05/20 1943 06/05/20 2236 06/06/20 0235 06/07/20 0037 06/08/20 1551 06/10/20 0231  WBC 8.0   < > 6.3 5.2 5.9 4.4  LATICACIDVEN 1.1  --   --   --   --   --    < > = values in this interval not displayed.    Microbiology Recent Results (from the past 240 hour(s))  Urine culture     Status: Abnormal   Collection Time: 06/05/20  7:43 PM   Specimen: Urine, Random  Result Value Ref Range Status   Specimen Description URINE, RANDOM  Final   Special Requests   Final    NONE Performed at [facility], [address], [place], [place] [address]    Culture MULTIPLE SPECIES PRESENT, SUGGEST RECOLLECTION (A)  Final   Report Status 06/06/2020 FINAL  Final  Culture, blood (routine x 2)     Status: None   Collection Time: 06/05/20  7:43 PM   Specimen: BLOOD  Result Value Ref Range Status   Specimen Description BLOOD SITE NOT SPECIFIED  Final   Special Requests   Final    BOTTLES DRAWN AEROBIC AND ANAEROBIC Blood Culture results may not be optimal due to an inadequate volume of blood received in culture bottles   Culture   Final    NO GROWTH 5 DAYS Performed at [facility], [place] [address], [place],  [address]    Report Status 06/10/2020 FINAL  Final  Respiratory  Panel by RT PCR (Flu A&B, Covid) - Nasopharyngeal Swab     Status: None   Collection Time: 06/05/20  7:43 PM   Specimen: Nasopharyngeal Swab  Result Value Ref Range Status   SARS Coronavirus 2 by RT PCR NEGATIVE NEGATIVE Final    Comment: (NOTE) SARS-CoV-2 target nucleic acids are NOT DETECTED.  The SARS-CoV-2 RNA is generally detectable in upper respiratoy specimens during the acute phase of infection. The lowest concentration of SARS-CoV-2 viral copies this assay can detect is 131 copies/mL. A negative result does not preclude SARS-Cov-2 infection and should not be used as the sole basis for treatment or other patient management decisions. A negative result may occur with  improper specimen collection/handling, submission of specimen other than nasopharyngeal swab, presence of viral mutation(s) within the areas targeted by this assay, and inadequate number of viral copies (<131 copies/mL). A negative result must be combined with clinical observations, patient history, and epidemiological information. The expected result is Negative.  Fact Sheet for Patients:  PinkCheek.be  Fact Sheet for Healthcare Providers:  GravelBags.it  This test is no t yet approved or cleared by the [place] FDA and  has been  authorized for detection and/or diagnosis of SARS-CoV-2 by FDA under an Emergency Use Authorization (EUA). This EUA will remain  in effect (meaning this test can be used) for the duration of the COVID-19 declaration under Section 564(b)(1) of the Act, 21 U.S.C. section 360bbb-3(b)(1), unless the authorization is terminated or revoked sooner.     Influenza A by PCR NEGATIVE NEGATIVE Final   Influenza B by PCR NEGATIVE NEGATIVE Final    Comment: (NOTE) The Xpert Xpress SARS-CoV-2/FLU/RSV assay is intended as an aid in  the diagnosis of influenza from Nasopharyngeal swab specimens and  should not be used as a sole basis  for treatment. Nasal washings and  aspirates are unacceptable for Xpert Xpress SARS-CoV-2/FLU/RSV  testing.  Fact Sheet for Patients: PinkCheek.be  Fact Sheet for Healthcare Providers: GravelBags.it  This test is not yet approved or cleared by the [place] FDA and  has been authorized for detection and/or diagnosis of SARS-CoV-2 by  FDA under an Emergency Use Authorization (EUA). This EUA will remain  in effect (meaning this test can be used) for the duration of the  Covid-19 declaration under Section 564(b)(1) of the Act, 21  U.S.C. section 360bbb-3(b)(1), unless the authorization is  terminated or revoked. Performed at [facility], [place] [address], [place], [place] [address]   Culture, blood (Routine X 2) w Reflex to ID Panel     Status: None   Collection Time: 06/05/20 11:41 PM   Specimen: BLOOD RIGHT FOREARM  Result Value Ref Range Status   Specimen Description BLOOD RIGHT FOREARM  Final   Special Requests   Final    BOTTLES DRAWN AEROBIC AND ANAEROBIC Blood Culture adequate volume   Culture   Final    NO GROWTH 5 DAYS Performed at [facility], [place] [address] [place], [place] [address]    Report Status 06/10/2020 FINAL  Final    Procedures and diagnostic studies:  CT HEAD WO CONTRAST  Result Date: 06/09/2020 CLINICAL DATA:  Vertigo, central. Additional history provided: Vertigo when standing. EXAM: CT HEAD WITHOUT CONTRAST TECHNIQUE: Contiguous axial images were obtained from the base of the skull through the vertex without intravenous contrast. COMPARISON:  MRA head 12/03/2019.  Brain MRI 04/22/2017. FINDINGS: Brain: Mild generalized cerebral atrophy. Redemonstrated small chronic cortically based infarcts within the posterosuperior left temporal lobe and left parietal lobe. Background mild ill-defined hypoattenuation within the cerebral white matter is nonspecific, but compatible with chronic small  vessel ischemic disease. Redemonstrated chronic infarct within the left cerebellar hemisphere. There is no acute intracranial hemorrhage. No acute demarcated cortical infarct is identified. No extra-axial fluid collection. No evidence of intracranial mass. No midline shift. Vascular: No hyperdense vessel. Skull: Normal. Negative for fracture or focal lesion. Sinuses/Orbits: Visualized orbits show no acute finding. Chronic medially displaced deformity of the right lamina papyracea. No significant paranasal sinus disease at the imaged levels. IMPRESSION: No evidence of acute intracranial abnormality. Redemonstrated small chronic cortically-based infarcts within the posterosuperior left temporal lobe and left parietal lobe. Redemonstrated chronic left cerebellar infarct. Background mild cerebral atrophy and chronic small vessel ischemic disease. Electronically Signed   By: Kellie Simmering DO   On: 06/09/2020 19:32    Medications:   . aspirin EC  81 mg Oral Daily  . carvedilol  3.125 mg Oral BID WC  . clopidogrel  75 mg Oral Daily  . diclofenac Sodium  2 g Topical QID  . heparin  5,000 Units Subcutaneous Q8H  . levothyroxine  25 mcg  Oral QAC breakfast  . lidocaine  1 patch Transdermal Q24H  . meclizine  12.5 mg Oral TID  . sodium bicarbonate  650 mg Oral BID  . Tafamidis  61 mg Oral Daily   Continuous Infusions:    LOS: 4 days   Geradine Girt  [facility]   How to contact the [facility] Attending or Consulting provider [place] or covering provider during after hours [place], for this patient?  1. Check the care team in [facility] and look for a) attending/consulting TR[facility] provider listed and b) the [facility] team listed 2. Log into [email or web address] and use [place]'s universal password to access. If you do not have the password, please contact the hospital operator. 3. Locate the [facility] provider you are looking for under [facility] and page to a number that you can be directly reached. 4. If you still  have difficulty reaching the provider, please page the [facility] (Director on Call) for the Hospitalists listed on amion for assistance.  06/10/2020, 3:20 PM

## 2020-06-10 NOTE — Progress Notes (Addendum)
Physical Therapy Treatment Patient Details Name: GRACEN RINGWALD MRN: [id number] DOB: 07-Jan-1946 Today's Date: 06/10/2020    History of Present Illness 74 yo female with onset of light headed feeling and malaise was sent to hosp, admitted and noted elevation of creatinine and CK high.  Dx acute renal failure wiht elevated transaminase, CHF and amyloid disease. Has AKI and rhabdomyolysis.  PMHx: amyloid heart disease, stroke, CHF, CKD 3b, hypothyroidism,     PT Comments    Patient received in bed, pleasant and cooperative with therapy. Performed formal orthostatic check, VSS on RA and did not become tachycardic today. She did report chest pain with mobility, so made attending MD aware through epic secure messenger and limited to primarily transfers only. Light headed throughout session- note she is unable to really give me specifics on what makes her symptoms worse or better, but reported increased dizziness after bending forward at trunk to wipe herself after using bathroom. Right now would suspect possible BPPV versus central origin from past strokes, will plan for more in depth assessment of vestibular system tomorrow. Left up in recliner with all needs met, chair alarm active and patient stable.  Continue to recommend SNF.    Follow Up Recommendations  SNF     Equipment Recommendations  None recommended by PT    Recommendations for Other Services       Precautions / Restrictions Precautions Precautions: Fall Restrictions Weight Bearing Restrictions: No    Mobility  Bed Mobility Overal bed mobility: Needs Assistance Bed Mobility: Supine to Sit     Supine to sit: Min assist;HOB elevated     General bed mobility comments: able to bring BLEs off EOB but needed MinA at trunk to bring hips around and scoot to EOB  Transfers Overall transfer level: Needs assistance Equipment used: Rolling walker (2 wheeled) Transfers: Sit to/from Stand, Stand Pivot  Sit to Stand: Min assist   Stand Pivot: Min guard        General transfer comment: min assist to power up, cues for hand placement and sequencing  Ambulation/Gait Ambulation/Gait assistance: Min guard Gait Distance (Feet): 3 Feet Assistive device: Rolling walker (2 wheeled) Gait Pattern/deviations: Trunk flexed;Step-through pattern;Decreased step length - right;Decreased step length - left;Decreased stride length Gait velocity: decreased   General Gait Details: limited due to chest pain with mobility (MD messaged during session and aware); slow but steady with RW   Stairs             Wheelchair Mobility    Modified Rankin (Stroke Patients Only)       Balance Overall balance assessment: Needs assistance Sitting-balance support: Feet supported Sitting balance-Leahy Scale: Good     Standing balance support: Single extremity supported;During functional activity Standing balance-Leahy Scale: Fair Standing balance comment: reliant on BUE support                            Cognition Arousal/Alertness: Awake/alert Behavior During Therapy: WFL for tasks assessed/performed Overall Cognitive Status: Within Functional Limits for tasks assessed                                        Exercises      General Comments General comments (skin integrity, edema, etc.): performed formal orthostatic check- VSS on RA      Pertinent Vitals/Pain Pain Assessment: No/denies pain    Home  Living                      Prior Function            PT Goals (current goals can now be found in the care plan section) Acute Rehab PT Goals Patient Stated Goal: to be safe and not fall PT Goal Formulation: With patient Time For Goal Achievement: 06/21/20 Potential to Achieve Goals: Good Progress towards PT goals: Progressing toward goals    Frequency    Min 3X/week      PT Plan Current plan remains appropriate    Co-evaluation              AM-PAC PT "6 Clicks"  Mobility   Outcome Measure  Help needed turning from your back to your side while in a flat bed without using bedrails?: None Help needed moving from lying on your back to sitting on the side of a flat bed without using bedrails?: A Little Help needed moving to and from a bed to a chair (including a wheelchair)?: A Little Help needed standing up from a chair using your arms (e.g., wheelchair or bedside chair)?: A Little Help needed to walk in hospital room?: A Little Help needed climbing 3-5 steps with a railing? : A Lot 6 Click Score: 18    End of Session Equipment Utilized During Treatment: Gait belt Activity Tolerance: Treatment limited secondary to medical complications (Comment) (chest pain with mobility) Patient left: in chair;with call bell/phone within reach;with chair alarm set Nurse Communication: Mobility status PT Visit Diagnosis: Unsteadiness on feet (R26.81);Muscle weakness (generalized) (M62.81);Difficulty in walking, not elsewhere classified (R26.2)     Time: 2162-4469 PT Time Calculation (min) (ACUTE ONLY): 43 min  Charges:  $Therapeutic Activity: 23-37 mins $Self Care/Home Management: 8-22                     Windell Norfolk, DPT, PN1   Supplemental Physical Therapist [place]    Pager [phone] Acute Rehab Office [phone]

## 2020-06-10 NOTE — Progress Notes (Signed)
Pt reports aching 6/10 L chest pain. Does not radiate, no pressure, no SOB. Vitals WDL. Pt also reports some light headedness. EKG performed. Contacted MD, orders placed for PRN nitro as well as troponin. Pt reports relief of chest pain after nitro administration. Day shift RN updated on POC.

## 2020-06-11 ENCOUNTER — Inpatient Hospital Stay (HOSPITAL_COMMUNITY): Payer: Medicare Other

## 2020-06-11 DIAGNOSIS — I361 Nonrheumatic tricuspid (valve) insufficiency: Secondary | ICD-10-CM | POA: Diagnosis not present

## 2020-06-11 DIAGNOSIS — R0600 Dyspnea, unspecified: Secondary | ICD-10-CM

## 2020-06-11 DIAGNOSIS — I340 Nonrheumatic mitral (valve) insufficiency: Secondary | ICD-10-CM | POA: Diagnosis not present

## 2020-06-11 DIAGNOSIS — N1832 Chronic kidney disease, stage 3b: Secondary | ICD-10-CM | POA: Diagnosis not present

## 2020-06-11 DIAGNOSIS — N189 Chronic kidney disease, unspecified: Secondary | ICD-10-CM

## 2020-06-11 DIAGNOSIS — I5032 Chronic diastolic (congestive) heart failure: Secondary | ICD-10-CM | POA: Diagnosis not present

## 2020-06-11 DIAGNOSIS — N179 Acute kidney failure, unspecified: Secondary | ICD-10-CM | POA: Diagnosis not present

## 2020-06-11 DIAGNOSIS — E854 Organ-limited amyloidosis: Secondary | ICD-10-CM | POA: Diagnosis not present

## 2020-06-11 LAB — COMPREHENSIVE METABOLIC PANEL
ALT: 130 U/L — ABNORMAL HIGH (ref 0–44)
AST: 86 U/L — ABNORMAL HIGH (ref 15–41)
Albumin: 2.3 g/dL — ABNORMAL LOW (ref 3.5–5.0)
Alkaline Phosphatase: 57 U/L (ref 38–126)
Anion gap: 8 (ref 5–15)
BUN: 34 mg/dL — ABNORMAL HIGH (ref 8–23)
CO2: 19 mmol/L — ABNORMAL LOW (ref 22–32)
Calcium: 9 mg/dL (ref 8.9–10.3)
Chloride: 120 mmol/L — ABNORMAL HIGH (ref 98–111)
Creatinine, Ser: 3.1 mg/dL — ABNORMAL HIGH (ref 0.44–1.00)
GFR, Estimated: 15 mL/min — ABNORMAL LOW (ref >60)
Glucose, Bld: 96 mg/dL (ref 70–99)
Potassium: 4 mmol/L (ref 3.5–5.1)
Sodium: 147 mmol/L — ABNORMAL HIGH (ref 135–145)
Total Bilirubin: 0.5 mg/dL (ref 0.3–1.2)
Total Protein: 5.6 g/dL — ABNORMAL LOW (ref 6.5–8.1)

## 2020-06-11 LAB — CBC
HCT: 33.5 % — ABNORMAL LOW (ref 36.0–46.0)
Hemoglobin: 10.7 g/dL — ABNORMAL LOW (ref 12.0–15.0)
MCH: 31.3 pg (ref 26.0–34.0)
MCHC: 31.9 g/dL (ref 30.0–36.0)
MCV: 98 fL (ref 80.0–100.0)
Platelets: 221 10*3/uL (ref 150–400)
RBC: 3.42 MIL/uL — ABNORMAL LOW (ref 3.87–5.11)
RDW: 14.4 % (ref 11.5–15.5)
WBC: 5.4 10*3/uL (ref 4.0–10.5)
nRBC: 0 % (ref 0.0–0.2)

## 2020-06-11 LAB — ECHOCARDIOGRAM COMPLETE
Area-P 1/2: 2.69 cm2
Height: 63 in
S' Lateral: 3.9 cm
Weight: 2620.83 oz

## 2020-06-11 LAB — CK: Total CK: 2627 U/L — ABNORMAL HIGH (ref 38–234)

## 2020-06-11 MED ORDER — SODIUM CHLORIDE 0.9 % IV SOLN: INTRAVENOUS | Status: DC

## 2020-06-11 MED ORDER — MECLIZINE HCL 25 MG PO TABS
12.5000 mg | ORAL_TABLET | Freq: Two times a day (BID) | ORAL | Status: DC
  Administered 2020-06-11 – 2020-06-12 (×3): 12.5 mg via ORAL
  Filled 2020-06-11 (×4): qty 1

## 2020-06-11 NOTE — Progress Notes (Signed)
Physical Therapy Treatment Patient Details Name: Katie [name] MRN: [id number] DOB: September 04, 1945 Today's Date: 06/11/2020    History of Present Illness 74 yo female with onset of light headed feeling and malaise was sent to hosp, admitted and noted elevation of creatinine and CK high.  Dx acute renal failure wiht elevated transaminase, CHF and amyloid disease. Has AKI and rhabdomyolysis.  PMHx: amyloid heart disease, stroke, CHF, CKD 3b, hypothyroidism,     PT Comments    Pt admitted with above diagnosis. Pt treated for right posterior canal BPPV and was dizzy after treatment. IV team came in and needed to place IV therefore could not sit pt in chair.  Did sit pt up in bed.  Will continue to follow acutely.  Pt currently with functional limitations due to balance and endurance deficits. Pt will benefit from skilled PT to increase their independence and safety with mobility to allow discharge to the venue listed below.    Follow Up Recommendations  SNF     Equipment Recommendations  None recommended by PT    Recommendations for Other Services       Precautions / Restrictions Precautions Precautions: Fall Precaution Comments: monitor vitals Restrictions Weight Bearing Restrictions: No    Mobility  Bed Mobility Overal bed mobility: Needs Assistance Bed Mobility: Supine to Sit     Supine to sit: Min assist;HOB elevated     General bed mobility comments: able to bring BLEs off EOB but needed MinA at trunk to bring hips around and scoot to EOB  Transfers Overall transfer level: Needs assistance   Transfers: Sit to/from Stand Sit to Stand: Min assist         General transfer comment: min assist to power up, cues for hand placement and sequencing  Ambulation/Gait             General Gait Details: IV team came in and needed pt back in bed   Stairs             Wheelchair Mobility    Modified Rankin (Stroke Patients Only)       Balance Overall balance  assessment: Needs assistance Sitting-balance support: Feet supported;No upper extremity supported Sitting balance-Leahy Scale: Good     Standing balance support: Single extremity supported;During functional activity Standing balance-Leahy Scale: Fair Standing balance comment: reliant on BUE support                            Cognition Arousal/Alertness: Awake/alert Behavior During Therapy: WFL for tasks assessed/performed Overall Cognitive Status: Within Functional Limits for tasks assessed                                        Exercises      General Comments General comments (skin integrity, edema, etc.): Pt tested positive for right BPPV therefore treated with canalith repositioning maneuver for right posterior canal BPPV.  Pt dizziness incr but was subsiding after treatment some.        Pertinent Vitals/Pain Pain Assessment: No/denies pain    Home Living                      Prior Function            PT Goals (current goals can now be found in the care plan section) Acute Rehab PT Goals Patient Stated Goal:  to be safe and not fall Progress towards PT goals: Progressing toward goals    Frequency    Min 3X/week      PT Plan Current plan remains appropriate    Co-evaluation              AM-PAC PT "6 Clicks" Mobility   Outcome Measure  Help needed turning from your back to your side while in a flat bed without using bedrails?: None Help needed moving from lying on your back to sitting on the side of a flat bed without using bedrails?: A Little Help needed moving to and from a bed to a chair (including a wheelchair)?: A Little Help needed standing up from a chair using your arms (e.g., wheelchair or bedside chair)?: A Little Help needed to walk in hospital room?: A Little Help needed climbing 3-5 steps with a railing? : A Lot 6 Click Score: 18    End of Session Equipment Utilized During Treatment: Gait  belt Activity Tolerance: Patient limited by fatigue Patient left: with call bell/phone within reach;in bed;with nursing/sitter in room Nurse Communication: Mobility status PT Visit Diagnosis: Unsteadiness on feet (R26.81);Muscle weakness (generalized) (M62.81);Difficulty in walking, not elsewhere classified (R26.2)     Time: 4270-6237 PT Time Calculation (min) (ACUTE ONLY): 23 min  Charges:  $Therapeutic Activity: 8-22 mins $Canalith Rep Proc: 8-22 mins                     Laynie Espy W,PT Acute Rehabilitation Services Pager:  [phone]  Office:  [place] 06/11/2020, 11:36 AM

## 2020-06-11 NOTE — Progress Notes (Signed)
  Echocardiogram 2D Echocardiogram has been performed.  Matilde Bash 06/11/2020, 2:58 PM

## 2020-06-11 NOTE — Care Management Important Message (Signed)
Important Message  Patient Details  Name: TZIPORA MCINROY MRN: [id number] Date of Birth: 02-10-1946   Medicare Important Message Given:  Yes     Barb Merino Jayliani Wanner 06/11/2020, 3:30 PM

## 2020-06-11 NOTE — TOC Progression Note (Addendum)
Transition of Care [facility]) - Progression Note    Patient Details  Name: Katie [name] MRN: [id number] Date of Birth: 1945/09/05  Transition of Care [facility]) CM/SW Contact  Sharlet Salina Mila Homer, LCSW Phone Number: 06/11/2020, 12:43 PM  Clinical Narrative:  Visited with patient at the bedside and informed [name] that visit was to give her the names of the skilled nursing facilities that could take her for [place] rehab. When asked, patient gave CSW permission to call her daughter and call was made in patient's room. Contacted daughter Okla Qazi [phone]) and provided her with facilities responses. Daughter explained that she is at work and will check on the facilities after she gets off work (6:30 pm) and made a decision this evening. [name] provided with information regarding Medicare.gov and informed daughter that she would can contact CSW tomorrow.   2:26 pm: Contacted [employer] and talked with Olin Hauser regarding patient and the possibility of extending the authorization another day. Olin Hauser requested updated physical therapy notes and these (11/9 and 11/10) were faxed to her [phone]). CSW should hear from [place] later today or tomorrow regarding insurance auth being extended.     Expected Discharge Plan: [place] Barriers to Discharge: Continued Medical Work up, SNF Pending bed offer  Expected Discharge Plan and Services Expected Discharge Plan: [place] Choice: [place] arrangements for the past 2 months: Single Family Home                                      Social Determinants of Health (SDOH) Interventions  No SDOH interventions requested or needed at this time  Readmission Risk Interventions No flowsheet data found.

## 2020-06-11 NOTE — Progress Notes (Addendum)
Progress Note    Katie [name]  HEN:[id number] DOB: [birth year]  DOA: 06/05/2020 PCP: Seward Carol, MD    Brief Narrative:    Medical records reviewed and are as summarized below:  Katie [name] is an 74 y.o. female with medical history significant for chronic systolic and diastolic CHF, moderate to severe MR, mitral clip, cerebellar stroke, chronic kidney disease stage IIIb, history of nephrectomy, history of CVA, cardiac amyloidosis and hypothyroidism, now presenting to emergency department for evaluation of general malaise, lightheadedness on standing, and increased creatinine on outpatient blood work.  Patient reports that she saw her PCP for evaluation of the aforementioned symptoms which began approximately 2 weeks ago, have blood work performed, and daughter was notified of her elevated creatinine and recommendation for evaluation in the emergency department.   CK found to be markedly elevated.   Assessment/Plan:   Rhabdomyolysis -Suspect secondary to drug drug interactions in the setting of tafamidis and rosuvastatin, no other etiology, no falls, no trauma/ seizures, or strenuous exercise -CK improving with hydration -Statin discontinued -Continue to trend  Acute kidney injury superimposed on CKD IIIb; metabolic acidosis - Pt reports remote nephrectomy due to cancer and presents with 2 weeks of general malaise, lightheadedness on standing, and increased creatinine on outpatient blood work  - SCr is 3.84 on admission, up from a baseline of 2 - Slowly improving,, creatinine down to 3.1, continue IV fluids another 1-2 more days -BMP in a.m. -UA with 100 proteinuria,, will need nephrology follow-up  Elevated transaminases  -This is due to rhabdomyolysis -Improving   Chronic diastolic CHF; cardiac amyloidosis   - Appears compensated  - Hold diuretic while hydrating with IVF, continue tafamidis    Hypothyroidism    - Continue Synthroid    History of CVA  - Continue  ASA and Plavix, statin stopped due to rhabdomyolysis  Headache/dizziness -Check orthostatics, ECHO -History of prior cerebellar stroke -PT eval  Rib pain -Tenderness over left lower lateral ribs -Do not suspect ACS -EKG w/o changes  Will need OSA work up outpatient  PT Eval-->SNF   Family Communication/Anticipated D/C date and plan/Code Status    DVT prophylaxis: heparin Code Status: Full Code Disposition Plan: Status is: inpt Family communication: Discussed with patient in detail, no family at bedside    Dispo: The patient is from: Home              Anticipated d/c is to: snf              Anticipated d/c date is: Likely 48 hours              Patient currently is not medically stable to d/c.     Medical Consultants:    None.    Subjective:   -Complains of intermittent headaches and dizziness, reports weakness in her legs  Objective:    Vitals:   06/10/20 1701 06/10/20 2006 06/11/20 0644 06/11/20 0951  BP: 130/77 120/69 122/72 125/79  Pulse: 67 70 84 79  Resp: 16 17 18 18   Temp: 97.7 F (36.5 C) 98 F (36.7 C) 99.5 F (37.5 C) 98.4 F (36.9 C)  TempSrc: Oral Oral Oral Oral  SpO2: 99% 99% 98% 100%  Weight:      Height:        Intake/Output Summary (Last 24 hours) at 06/11/2020 1137 Last data filed at 06/11/2020 1107 Gross per 24 hour  Intake 440 ml  Output 2650 ml  Net -2210 ml   Filed  Weights   06/07/20 2030 06/09/20 2100 06/10/20 0500  Weight: 72.9 kg 72.9 kg 74.3 kg    Exam Gen: Awake, Alert, Oriented X 3, no distress HEENT: PERRLA, Neck supple, no JVD Lungs: CTAB CVS: RRR,No Gallops,Rubs or new Murmurs Abd: soft, Non tender, non distended, BS present Extremities: No edema Skin: no new rashes   Data Reviewed:   I have personally reviewed following labs and imaging studies:  Labs: Labs show the following:   Basic Metabolic Panel: Recent Labs  Lab 06/06/20 0235 06/06/20 0235 06/07/20 0037 06/07/20 0037 06/08/20 1125  06/08/20 1125 06/10/20 0231 06/11/20 0140  NA 144  --  144  --  144  --  144 147*  K 4.3   < > 4.3   < > 4.1   < > 3.8 4.0  CL 117*  --  117*  --  117*  --  119* 120*  CO2 20*  --  19*  --  19*  --  18* 19*  GLUCOSE 128*  --  96  --  92  --  92 96  BUN 42*  --  41*  --  39*  --  34* 34*  CREATININE 3.84*  --  3.65*  --  3.48*  --  3.24* 3.10*  CALCIUM 9.8  --  9.0  --  9.2  --  8.8* 9.0  PHOS 3.5  --   --   --   --   --   --   --    < > = values in this interval not displayed.   GFR Estimated Creatinine Clearance: 15.1 mL/min (A) (by C-G formula based on SCr of 3.1 mg/dL (H)). Liver Function Tests: Recent Labs  Lab 06/06/20 0235 06/07/20 0037 06/08/20 1125 06/10/20 0231 06/11/20 0140  AST 566* 408* 249* 115* 86*  ALT 272* 243* 228* 147* 130*  ALKPHOS 74 50 58 47 57  BILITOT 0.7 0.5 0.5 0.6 0.5  PROT 6.4* 5.9* 6.2* 5.5* 5.6*  ALBUMIN 2.6* 2.4* 2.5* 2.2* 2.3*   No results for input(s): LIPASE, AMYLASE in the last 168 hours. No results for input(s): AMMONIA in the last 168 hours. Coagulation profile No results for input(s): INR, PROTIME in the last 168 hours.  CBC: Recent Labs  Lab 06/05/20 1943 06/05/20 2236 06/06/20 0235 06/07/20 0037 06/08/20 1551 06/10/20 0231 06/11/20 0140  WBC 8.0   < > 6.3 5.2 5.9 4.4 5.4  NEUTROABS 5.8  --   --   --   --   --   --   HGB 13.4   < > 11.8* 11.2* 11.3* 10.9* 10.7*  HCT 42.6   < > 36.7 35.2* 34.5* 34.0* 33.5*  MCV 99.3   < > 99.5 98.3 97.5 98.8 98.0  PLT 270   < > 245 241 226 234 221   < > = values in this interval not displayed.   Cardiac Enzymes: Recent Labs  Lab 06/07/20 0037 06/08/20 1125 06/09/20 0247 06/10/20 0231 06/11/20 0140  CKTOTAL 17,161* 9,051* 5,830* 3,818* 2,627*   BNP (last 3 results) No results for input(s): PROBNP in the last 8760 hours. CBG: Recent Labs  Lab 06/05/20 2018  GLUCAP 132*   D-Dimer: No results for input(s): DDIMER in the last 72 hours. Hgb A1c: No results for input(s): HGBA1C  in the last 72 hours. Lipid Profile: No results for input(s): CHOL, HDL, LDLCALC, TRIG, CHOLHDL, LDLDIRECT in the last 72 hours. Thyroid function studies: No results for input(s): TSH, T4TOTAL,  T3FREE, THYROIDAB in the last 72 hours.  Invalid input(s): FREET3 Anemia work up: No results for input(s): VITAMINB12, FOLATE, FERRITIN, TIBC, IRON, RETICCTPCT in the last 72 hours. Sepsis Labs: Recent Labs  Lab 06/05/20 1943 06/05/20 2236 06/07/20 0037 06/08/20 1551 06/10/20 0231 06/11/20 0140  WBC 8.0   < > 5.2 5.9 4.4 5.4  LATICACIDVEN 1.1  --   --   --   --   --    < > = values in this interval not displayed.    Microbiology Recent Results (from the past 240 hour(s))  Urine culture     Status: Abnormal   Collection Time: 06/05/20  7:43 PM   Specimen: Urine, Random  Result Value Ref Range Status   Specimen Description URINE, RANDOM  Final   Special Requests   Final    NONE Performed at [facility], [address], [place], [place] [address]    Culture MULTIPLE SPECIES PRESENT, SUGGEST RECOLLECTION (A)  Final   Report Status 06/06/2020 FINAL  Final  Culture, blood (routine x 2)     Status: None   Collection Time: 06/05/20  7:43 PM   Specimen: BLOOD  Result Value Ref Range Status   Specimen Description BLOOD SITE NOT SPECIFIED  Final   Special Requests   Final    BOTTLES DRAWN AEROBIC AND ANAEROBIC Blood Culture results may not be optimal due to an inadequate volume of blood received in culture bottles   Culture   Final    NO GROWTH 5 DAYS Performed at [facility], [place] [address] [place],  [address]    Report Status 06/10/2020 FINAL  Final  Respiratory Panel by RT PCR (Flu A&B, Covid) - Nasopharyngeal Swab     Status: None   Collection Time: 06/05/20  7:43 PM   Specimen: Nasopharyngeal Swab  Result Value Ref Range Status   SARS Coronavirus 2 by RT PCR NEGATIVE NEGATIVE Final    Comment: (NOTE) SARS-CoV-2 target nucleic acids are NOT DETECTED.  The  SARS-CoV-2 RNA is generally detectable in upper respiratoy specimens during the acute phase of infection. The lowest concentration of SARS-CoV-2 viral copies this assay can detect is 131 copies/mL. A negative result does not preclude SARS-Cov-2 infection and should not be used as the sole basis for treatment or other patient management decisions. A negative result may occur with  improper specimen collection/handling, submission of specimen other than nasopharyngeal swab, presence of viral mutation(s) within the areas targeted by this assay, and inadequate number of viral copies (<131 copies/mL). A negative result must be combined with clinical observations, patient history, and epidemiological information. The expected result is Negative.  Fact Sheet for Patients:  PinkCheek.be  Fact Sheet for Healthcare Providers:  GravelBags.it  This test is no t yet approved or cleared by the [place] FDA and  has been authorized for detection and/or diagnosis of SARS-CoV-2 by FDA under an Emergency Use Authorization (EUA). This EUA will remain  in effect (meaning this test can be used) for the duration of the COVID-19 declaration under Section 564(b)(1) of the Act, 21 U.S.C. section 360bbb-3(b)(1), unless the authorization is terminated or revoked sooner.     Influenza A by PCR NEGATIVE NEGATIVE Final   Influenza B by PCR NEGATIVE NEGATIVE Final    Comment: (NOTE) The Xpert Xpress SARS-CoV-2/FLU/RSV assay is intended as an aid in  the diagnosis of influenza from Nasopharyngeal swab specimens and  should not be used as a sole basis for  treatment. Nasal washings and  aspirates are unacceptable for Xpert Xpress SARS-CoV-2/FLU/RSV  testing.  Fact Sheet for Patients: PinkCheek.be  Fact Sheet for Healthcare Providers: GravelBags.it  This test is not yet approved or cleared  by the [place] FDA and  has been authorized for detection and/or diagnosis of SARS-CoV-2 by  FDA under an Emergency Use Authorization (EUA). This EUA will remain  in effect (meaning this test can be used) for the duration of the  Covid-19 declaration under Section 564(b)(1) of the Act, 21  U.S.C. section 360bbb-3(b)(1), unless the authorization is  terminated or revoked. Performed at [facility], [place] [address], [place], [place] [address]   Culture, blood (Routine X 2) w Reflex to ID Panel     Status: None   Collection Time: 06/05/20 11:41 PM   Specimen: BLOOD RIGHT FOREARM  Result Value Ref Range Status   Specimen Description BLOOD RIGHT FOREARM  Final   Special Requests   Final    BOTTLES DRAWN AEROBIC AND ANAEROBIC Blood Culture adequate volume   Culture   Final    NO GROWTH 5 DAYS Performed at [facility], [place] [address] [place], [place] [address]    Report Status 06/10/2020 FINAL  Final    Procedures and diagnostic studies:  CT HEAD WO CONTRAST  Result Date: 06/09/2020 CLINICAL DATA:  Vertigo, central. Additional history provided: Vertigo when standing. EXAM: CT HEAD WITHOUT CONTRAST TECHNIQUE: Contiguous axial images were obtained from the base of the skull through the vertex without intravenous contrast. COMPARISON:  MRA head 12/03/2019.  Brain MRI 04/22/2017. FINDINGS: Brain: Mild generalized cerebral atrophy. Redemonstrated small chronic cortically based infarcts within the posterosuperior left temporal lobe and left parietal lobe. Background mild ill-defined hypoattenuation within the cerebral white matter is nonspecific, but compatible with chronic small vessel ischemic disease. Redemonstrated chronic infarct within the left cerebellar hemisphere. There is no acute intracranial hemorrhage. No acute demarcated cortical infarct is identified. No extra-axial fluid collection. No evidence of intracranial mass. No midline shift. Vascular: No hyperdense vessel.  Skull: Normal. Negative for fracture or focal lesion. Sinuses/Orbits: Visualized orbits show no acute finding. Chronic medially displaced deformity of the right lamina papyracea. No significant paranasal sinus disease at the imaged levels. IMPRESSION: No evidence of acute intracranial abnormality. Redemonstrated small chronic cortically-based infarcts within the posterosuperior left temporal lobe and left parietal lobe. Redemonstrated chronic left cerebellar infarct. Background mild cerebral atrophy and chronic small vessel ischemic disease. Electronically Signed   By: Kellie Simmering DO   On: 06/09/2020 19:32    Medications:   . aspirin EC  81 mg Oral Daily  . carvedilol  3.125 mg Oral BID WC  . clopidogrel  75 mg Oral Daily  . heparin  5,000 Units Subcutaneous Q8H  . levothyroxine  25 mcg Oral QAC breakfast  . lidocaine  1 patch Transdermal Q24H  . meclizine  12.5 mg Oral TID  . sodium bicarbonate  650 mg Oral BID  . Tafamidis  61 mg Oral Daily   Continuous Infusions: . sodium chloride 75 mL/hr at 06/11/20 1058     LOS: 5 days   Domenic Polite  [facility] 06/11/2020, 11:37 AM

## 2020-06-12 DIAGNOSIS — N1832 Chronic kidney disease, stage 3b: Secondary | ICD-10-CM | POA: Diagnosis not present

## 2020-06-12 DIAGNOSIS — E854 Organ-limited amyloidosis: Secondary | ICD-10-CM | POA: Diagnosis not present

## 2020-06-12 DIAGNOSIS — N179 Acute kidney failure, unspecified: Secondary | ICD-10-CM | POA: Diagnosis not present

## 2020-06-12 DIAGNOSIS — I5032 Chronic diastolic (congestive) heart failure: Secondary | ICD-10-CM | POA: Diagnosis not present

## 2020-06-12 LAB — VITAMIN B12: Vitamin B-12: 282 pg/mL (ref 180–914)

## 2020-06-12 LAB — CBC
HCT: 33 % — ABNORMAL LOW (ref 36.0–46.0)
Hemoglobin: 10.6 g/dL — ABNORMAL LOW (ref 12.0–15.0)
MCH: 31.6 pg (ref 26.0–34.0)
MCHC: 32.1 g/dL (ref 30.0–36.0)
MCV: 98.5 fL (ref 80.0–100.0)
Platelets: 229 10*3/uL (ref 150–400)
RBC: 3.35 MIL/uL — ABNORMAL LOW (ref 3.87–5.11)
RDW: 14.4 % (ref 11.5–15.5)
WBC: 6.7 10*3/uL (ref 4.0–10.5)
nRBC: 0 % (ref 0.0–0.2)

## 2020-06-12 LAB — BASIC METABOLIC PANEL
Anion gap: 11 (ref 5–15)
BUN: 31 mg/dL — ABNORMAL HIGH (ref 8–23)
CO2: 16 mmol/L — ABNORMAL LOW (ref 22–32)
Calcium: 8.9 mg/dL (ref 8.9–10.3)
Chloride: 118 mmol/L — ABNORMAL HIGH (ref 98–111)
Creatinine, Ser: 2.91 mg/dL — ABNORMAL HIGH (ref 0.44–1.00)
GFR, Estimated: 16 mL/min — ABNORMAL LOW (ref >60)
Glucose, Bld: 101 mg/dL — ABNORMAL HIGH (ref 70–99)
Potassium: 3.9 mmol/L (ref 3.5–5.1)
Sodium: 145 mmol/L (ref 135–145)

## 2020-06-12 LAB — CK: Total CK: 1241 U/L — ABNORMAL HIGH (ref 38–234)

## 2020-06-12 MED ORDER — SODIUM CHLORIDE 0.9 % IV SOLN: INTRAVENOUS | Status: DC

## 2020-06-12 NOTE — Progress Notes (Signed)
Progress Note    Katie [name]  IDP:[id number] DOB: [birth year]  DOA: 06/05/2020 PCP: Seward Carol, MD    Brief Narrative:    Medical records reviewed and are as summarized below:  Katie [name] is an 74 y.o. female with medical history significant for chronic systolic and diastolic CHF, moderate to severe MR, mitral clip, cerebellar stroke, chronic kidney disease stage IIIb, history of nephrectomy, history of CVA, cardiac amyloidosis and hypothyroidism, now presenting to emergency department for evaluation of general malaise, lightheadedness on standing, and increased creatinine on outpatient blood work.  Patient reports that she saw her PCP for evaluation of the aforementioned symptoms which began approximately 2 weeks ago, have blood work performed, and daughter was notified of her elevated creatinine and recommendation for evaluation in the emergency department.  Admitted with rhabdomyolysis, AKI and dizziness   Assessment/Plan:   Rhabdomyolysis -Suspect secondary to drug drug interactions in the setting of tafamidis and rosuvastatin, no other etiology, no falls, no trauma/ seizures, or strenuous exercise -CK improving with hydration -Statin discontinued -Continue to trend  Acute kidney injury superimposed on CKD IIIb; metabolic acidosis - Pt reports remote nephrectomy due to cancer and presents with 2 weeks of general malaise, lightheadedness on standing, and increased creatinine on outpatient blood work  - SCr is 3.84 on admission, up from a baseline of 2 - Slowly improving, creatinine down to 2.9 today, continue IV fluids for at least another day -UA with 100+ proteinuria, will need nephrology follow-up, discussed with Dr. Carolin Sicks from [facility], he will be happy to see her as outpatient  Elevated transaminases  -This is due to rhabdomyolysis -Improving   Chronic diastolic CHF; cardiac amyloidosis   - Appears compensated  - Hold diuretic while hydrating  with IVF, continue tafamidis  Peripheral neuropathy -This could be secondary to amyloidosis as well -Also check B12 level    Hypothyroidism    - Continue Synthroid    History of CVA  - Continue ASA and Plavix, statin stopped due to rhabdomyolysis  Headache/dizziness -Orthostatics negative now but has been hydrated for several days, echo with preserved EF, mitral clips noted, mild MR -History of prior cerebellar stroke -Symptoms improving -PT eval completed, SNF recommended for short-term rehab  Rib pain -Tenderness over left lower lateral ribs -Do not suspect ACS -EKG w/o changes  Will need OSA work up outpatient  PT Eval-->SNF   Family Communication/Anticipated D/C date and plan/Code Status    DVT prophylaxis: heparin Code Status: Full Code Disposition Plan: Status is: inpt Family communication: Discussed with patient in detail, no family at bedside    Dispo: The patient is from: Home              Anticipated d/c is to: snf              Anticipated d/c date is: Likely 48 hours              Patient currently is not medically stable to d/c.     Medical Consultants:    None.    Subjective:   -Dizziness is improving, reports pain and tingling in her legs  Objective:    Vitals:   06/11/20 2011 06/12/20 0410 06/12/20 0500 06/12/20 0946  BP: (!) 97/55 111/75  (!) 114/59  Pulse: 77 79  78  Resp: 18 18  18   Temp: 98.7 F (37.1 C) 98.5 F (36.9 C)  98.4 F (36.9 C)  TempSrc: Oral Oral  Oral  SpO2:  100% 96%  99%  Weight: 74.4 kg  74.4 kg   Height:        Intake/Output Summary (Last 24 hours) at 06/12/2020 1134 Last data filed at 06/12/2020 0601 Gross per 24 hour  Intake 2180.45 ml  Output 700 ml  Net 1480.45 ml   Filed Weights   06/10/20 0500 06/11/20 2011 06/12/20 0500  Weight: 74.3 kg 74.4 kg 74.4 kg    Exam Gen: Chronically ill elderly frail female sitting up in bed, AAOx3, no distress HEENT: No JVD CVS: S1-S2, regular rate rhythm,   Lungs: Clear bilaterally Abdomen: Soft, nontender, nondistended, bowel sounds present Extremities: No edema Skin: no new rashes   Data Reviewed:   I have personally reviewed following labs and imaging studies:  Labs: Labs show the following:   Basic Metabolic Panel: Recent Labs  Lab 06/06/20 0235 06/06/20 0235 06/07/20 0037 06/07/20 0037 06/08/20 1125 06/08/20 1125 06/10/20 0231 06/10/20 0231 06/11/20 0140 06/12/20 0054  NA 144   < > 144  --  144  --  144  --  147* 145  K 4.3   < > 4.3   < > 4.1   < > 3.8   < > 4.0 3.9  CL 117*   < > 117*  --  117*  --  119*  --  120* 118*  CO2 20*   < > 19*  --  19*  --  18*  --  19* 16*  GLUCOSE 128*   < > 96  --  92  --  92  --  96 101*  BUN 42*   < > 41*  --  39*  --  34*  --  34* 31*  CREATININE 3.84*   < > 3.65*  --  3.48*  --  3.24*  --  3.10* 2.91*  CALCIUM 9.8   < > 9.0  --  9.2  --  8.8*  --  9.0 8.9  PHOS 3.5  --   --   --   --   --   --   --   --   --    < > = values in this interval not displayed.   GFR Estimated Creatinine Clearance: 16.1 mL/min (A) (by C-G formula based on SCr of 2.91 mg/dL (H)). Liver Function Tests: Recent Labs  Lab 06/06/20 0235 06/07/20 0037 06/08/20 1125 06/10/20 0231 06/11/20 0140  AST 566* 408* 249* 115* 86*  ALT 272* 243* 228* 147* 130*  ALKPHOS 74 50 58 47 57  BILITOT 0.7 0.5 0.5 0.6 0.5  PROT 6.4* 5.9* 6.2* 5.5* 5.6*  ALBUMIN 2.6* 2.4* 2.5* 2.2* 2.3*   No results for input(s): LIPASE, AMYLASE in the last 168 hours. No results for input(s): AMMONIA in the last 168 hours. Coagulation profile No results for input(s): INR, PROTIME in the last 168 hours.  CBC: Recent Labs  Lab 06/05/20 1943 06/05/20 2236 06/07/20 0037 06/08/20 1551 06/10/20 0231 06/11/20 0140 06/12/20 0054  WBC 8.0   < > 5.2 5.9 4.4 5.4 6.7  NEUTROABS 5.8  --   --   --   --   --   --   HGB 13.4   < > 11.2* 11.3* 10.9* 10.7* 10.6*  HCT 42.6   < > 35.2* 34.5* 34.0* 33.5* 33.0*  MCV 99.3   < > 98.3 97.5 98.8  98.0 98.5  PLT 270   < > 241 226 234 221 229   < > = values in  this interval not displayed.   Cardiac Enzymes: Recent Labs  Lab 06/08/20 1125 06/09/20 0247 06/10/20 0231 06/11/20 0140 06/12/20 0054  CKTOTAL 9,051* 5,830* 3,818* 2,627* 1,241*   BNP (last 3 results) No results for input(s): PROBNP in the last 8760 hours. CBG: Recent Labs  Lab 06/05/20 2018  GLUCAP 132*   D-Dimer: No results for input(s): DDIMER in the last 72 hours. Hgb A1c: No results for input(s): HGBA1C in the last 72 hours. Lipid Profile: No results for input(s): CHOL, HDL, LDLCALC, TRIG, CHOLHDL, LDLDIRECT in the last 72 hours. Thyroid function studies: No results for input(s): TSH, T4TOTAL, T3FREE, THYROIDAB in the last 72 hours.  Invalid input(s): FREET3 Anemia work up: No results for input(s): VITAMINB12, FOLATE, FERRITIN, TIBC, IRON, RETICCTPCT in the last 72 hours. Sepsis Labs: Recent Labs  Lab 06/05/20 1943 06/05/20 2236 06/08/20 1551 06/10/20 0231 06/11/20 0140 06/12/20 0054  WBC 8.0   < > 5.9 4.4 5.4 6.7  LATICACIDVEN 1.1  --   --   --   --   --    < > = values in this interval not displayed.    Microbiology Recent Results (from the past 240 hour(s))  Urine culture     Status: Abnormal   Collection Time: 06/05/20  7:43 PM   Specimen: Urine, Random  Result Value Ref Range Status   Specimen Description URINE, RANDOM  Final   Special Requests   Final    NONE Performed at [facility], [address], [place], [place] [address]    Culture MULTIPLE SPECIES PRESENT, SUGGEST RECOLLECTION (A)  Final   Report Status 06/06/2020 FINAL  Final  Culture, blood (routine x 2)     Status: None   Collection Time: 06/05/20  7:43 PM   Specimen: BLOOD  Result Value Ref Range Status   Specimen Description BLOOD SITE NOT SPECIFIED  Final   Special Requests   Final    BOTTLES DRAWN AEROBIC AND ANAEROBIC Blood Culture results may not be optimal due to an inadequate volume of blood received  in culture bottles   Culture   Final    NO GROWTH 5 DAYS Performed at [facility], [place] [address] [place], [place] [address]    Report Status 06/10/2020 FINAL  Final  Respiratory Panel by RT PCR (Flu A&B, Covid) - Nasopharyngeal Swab     Status: None   Collection Time: 06/05/20  7:43 PM   Specimen: Nasopharyngeal Swab  Result Value Ref Range Status   SARS Coronavirus 2 by RT PCR NEGATIVE NEGATIVE Final    Comment: (NOTE) SARS-CoV-2 target nucleic acids are NOT DETECTED.  The SARS-CoV-2 RNA is generally detectable in upper respiratoy specimens during the acute phase of infection. The lowest concentration of SARS-CoV-2 viral copies this assay can detect is 131 copies/mL. A negative result does not preclude SARS-Cov-2 infection and should not be used as the sole basis for treatment or other patient management decisions. A negative result may occur with  improper specimen collection/handling, submission of specimen other than nasopharyngeal swab, presence of viral mutation(s) within the areas targeted by this assay, and inadequate number of viral copies (<131 copies/mL). A negative result must be combined with clinical observations, patient history, and epidemiological information. The expected result is Negative.  Fact Sheet for Patients:  PinkCheek.be  Fact Sheet for Healthcare Providers:  GravelBags.it  This test is no t yet approved or cleared by the [place] FDA and  has been authorized for detection  and/or diagnosis of SARS-CoV-2 by FDA under an Emergency Use Authorization (EUA). This EUA will remain  in effect (meaning this test can be used) for the duration of the COVID-19 declaration under Section 564(b)(1) of the Act, 21 U.S.C. section 360bbb-3(b)(1), unless the authorization is terminated or revoked sooner.     Influenza A by PCR NEGATIVE NEGATIVE Final   Influenza B by PCR NEGATIVE NEGATIVE Final     Comment: (NOTE) The Xpert Xpress SARS-CoV-2/FLU/RSV assay is intended as an aid in  the diagnosis of influenza from Nasopharyngeal swab specimens and  should not be used as a sole basis for treatment. Nasal washings and  aspirates are unacceptable for Xpert Xpress SARS-CoV-2/FLU/RSV  testing.  Fact Sheet for Patients: PinkCheek.be  Fact Sheet for Healthcare Providers: GravelBags.it  This test is not yet approved or cleared by the [place] FDA and  has been authorized for detection and/or diagnosis of SARS-CoV-2 by  FDA under an Emergency Use Authorization (EUA). This EUA will remain  in effect (meaning this test can be used) for the duration of the  Covid-19 declaration under Section 564(b)(1) of the Act, 21  U.S.C. section 360bbb-3(b)(1), unless the authorization is  terminated or revoked. Performed at [facility], [place] [address], [place], [place] [address]   Culture, blood (Routine X 2) w Reflex to ID Panel     Status: None   Collection Time: 06/05/20 11:41 PM   Specimen: BLOOD RIGHT FOREARM  Result Value Ref Range Status   Specimen Description BLOOD RIGHT FOREARM  Final   Special Requests   Final    BOTTLES DRAWN AEROBIC AND ANAEROBIC Blood Culture adequate volume   Culture   Final    NO GROWTH 5 DAYS Performed at [facility], [place] [address], [place],  [address]    Report Status 06/10/2020 FINAL  Final    Procedures and diagnostic studies:  ECHOCARDIOGRAM COMPLETE  Result Date: 06/11/2020    ECHOCARDIOGRAM REPORT   Patient Name:   Katie [name] Date of Exam: 06/11/2020 Medical Rec #:  [id number]    Height:       63.0 in Accession #:    [id number]   Weight:       163.8 lb Date of Birth:  11-24-1945   BSA:          1.776 m Patient Age:    71 years     BP:           125/79 mmHg Patient Gender: F            HR:           79 bpm. Exam Location:  Inpatient Procedure: 2D Echo, Cardiac Doppler  and Color Doppler Indications:    Dyspnea  History:        Patient has prior history of Echocardiogram examinations, most                 recent 01/23/2019. CHF, Stroke and Pulmonary HTN, Mitral Valve                 Disease and MV clip, MR, Signs/Symptoms:Dyspnea; Risk                 Factors:Hypertension and Dyslipidemia. Cardiac Amyloidosis.  Sonographer:    Dustin Flock Referring Phys: [place]  1. Left ventricular ejection fraction, by estimation, is 50 to 55%. The left ventricle has low normal function. The left ventricle has no regional wall  motion abnormalities. There is moderate left ventricular hypertrophy. Left ventricular diastolic parameters are indeterminate.  2. Right ventricular systolic function is normal. The right ventricular size is normal. There is mildly elevated pulmonary artery systolic pressure. The estimated right ventricular systolic pressure is 17.5 mmHg.  3. Left atrial size was severely dilated.  4. MitraClip in place, well seated. The mitral valve is normal in structure. Mild mitral valve regurgitation. The mean mitral valve gradient is 5.0 mmHg.  5. Tricuspid valve regurgitation is moderate to severe.  6. The aortic valve is normal in structure. Aortic valve regurgitation is not visualized. No aortic stenosis is present.  7. The inferior vena cava is normal in size with greater than 50% respiratory variability, suggesting right atrial pressure of 3 mmHg. Comparison(s): No significant change from prior study. Prior images reviewed side by side. FINDINGS  Left Ventricle: Left ventricular ejection fraction, by estimation, is 50 to 55%. The left ventricle has low normal function. The left ventricle has no regional wall motion abnormalities. The left ventricular internal cavity size was normal in size. There is moderate left ventricular hypertrophy. Left ventricular diastolic parameters are indeterminate. Right Ventricle: The right ventricular size is normal. No  increase in right ventricular wall thickness. Right ventricular systolic function is normal. There is mildly elevated pulmonary artery systolic pressure. The tricuspid regurgitant velocity is 3.00  m/s, and with an assumed right atrial pressure of 8 mmHg, the estimated right ventricular systolic pressure is 10.2 mmHg. Left Atrium: Left atrial size was severely dilated. Right Atrium: Right atrial size was normal in size. Pericardium: There is no evidence of pericardial effusion. Mitral Valve: MitraClip in place, well seated. The mitral valve is normal in structure. Mild mitral valve regurgitation. MV peak gradient, 13.4 mmHg. The mean mitral valve gradient is 5.0 mmHg. Tricuspid Valve: The tricuspid valve is normal in structure. Tricuspid valve regurgitation is moderate to severe. No evidence of tricuspid stenosis. Aortic Valve: The aortic valve is normal in structure. Aortic valve regurgitation is not visualized. No aortic stenosis is present. Pulmonic Valve: The pulmonic valve was normal in structure. Pulmonic valve regurgitation is not visualized. No evidence of pulmonic stenosis. Aorta: The aortic root is normal in size and structure. Venous: The inferior vena cava is normal in size with greater than 50% respiratory variability, suggesting right atrial pressure of 3 mmHg. IAS/Shunts: No atrial level shunt detected by color flow Doppler.  LEFT VENTRICLE PLAX 2D LVIDd:         5.10 cm  Diastology LVIDs:         3.90 cm  LV e' medial:    4.62 cm/s LV PW:         1.50 cm  LV E/e' medial:  32.0 LV IVS:        1.60 cm  LV e' lateral:   3.97 cm/s LVOT diam:     2.00 cm  LV E/e' lateral: 37.3 LV SV:         60 LV SV Index:   34 LVOT Area:     3.14 cm  RIGHT VENTRICLE RV Basal diam:  3.60 cm RV S prime:     7.14 cm/s TAPSE (M-mode): 2.7 cm LEFT ATRIUM              Index       RIGHT ATRIUM           Index LA diam:        4.50 cm  2.53 cm/m  RA Area:  14.20 cm LA Vol (A2C):   97.5 ml  54.89 ml/m RA Volume:   34.00 ml   19.14 ml/m LA Vol (A4C):   119.0 ml 66.99 ml/m LA Biplane Vol: 109.0 ml 61.36 ml/m  AORTIC VALVE LVOT Vmax:   94.20 cm/s LVOT Vmean:  66.300 cm/s LVOT VTI:    0.190 m  AORTA Ao Root diam: 2.30 cm MITRAL VALVE                TRICUSPID VALVE MV Area (PHT): 2.69 cm     TR Peak grad:   36.0 mmHg MV Peak grad:  13.4 mmHg    TR Vmax:        300.00 cm/s MV Mean grad:  5.0 mmHg MV Vmax:       1.83 m/s     SHUNTS MV Vmean:      95.8 cm/s    Systemic VTI:  0.19 m MV Decel Time: 282 msec     Systemic Diam: 2.00 cm MV E velocity: 148.00 cm/s MV A velocity: 66.20 cm/s MV E/A ratio:  2.24 Candee Furbish MD Electronically signed by Candee Furbish MD Signature Date/Time: 06/11/2020/3:55:34 PM    Final     Medications:   . aspirin EC  81 mg Oral Daily  . carvedilol  3.125 mg Oral BID WC  . clopidogrel  75 mg Oral Daily  . heparin  5,000 Units Subcutaneous Q8H  . levothyroxine  25 mcg Oral QAC breakfast  . lidocaine  1 patch Transdermal Q24H  . meclizine  12.5 mg Oral BID  . sodium bicarbonate  650 mg Oral BID  . Tafamidis  61 mg Oral Daily   Continuous Infusions: . sodium chloride 75 mL/hr at 06/12/20 1014     LOS: 6 days   [facility] 06/12/2020, 11:34 AM

## 2020-06-12 NOTE — Progress Notes (Addendum)
Physical Therapy Treatment/Vestibular Treatment Patient Details Name: Katie [name] MRN: [id number] DOB: 09-26-1945 Today's Date: 06/12/2020    History of Present Illness 74 yo female with onset of light headed feeling and malaise was sent to hosp, admitted and noted elevation of creatinine and CK high.  Dx acute renal failure wiht elevated transaminase, CHF and amyloid disease. Has AKI and rhabdomyolysis.  PMHx: amyloid heart disease, stroke, CHF, CKD 3b, hypothyroidism,     PT Comments    Pt remains "swimmy headed" mostly with supine to sit transition, sit to stand and quick movements of her head.  She had negative canal testing today.  Gaze stability testing/exercises did bother her quite a bit, so she would benefit from x1 seated vertical and horizontal HEP.  She does have a chronic cerebellar stroke on her CT scan from this admission which may explain her vertigo despite lacking central signs on my oculomotor exam.    Follow Up Recommendations  SNF     Equipment Recommendations  None recommended by PT    Recommendations for Other Services       Precautions / Restrictions Precautions Precautions: Fall Precaution Comments: monitor vitals, dizzy, R knee pain (as of 06/12/20)    Mobility  Bed Mobility Overal bed mobility: Needs Assistance Bed Mobility: Supine to Sit;Sit to Supine     Supine to sit: Min assist Sit to supine: Mod assist   General bed mobility comments: Min hand held assist to come up to sitting EOB x 2, mod assist to help lift both legs back into the bed x 2.   Transfers Overall transfer level: Needs assistance Equipment used: Rolling walker (2 wheeled) Transfers: Sit to/from Stand Sit to Stand: Min assist;From elevated surface         General transfer comment: Min assist to power up to standing and stabilize on RW, pt shifted to her left leg with heavy reliance on arms today because of L knee pain, swelling.    Ambulation/Gait Ambulation/Gait  assistance: Min assist Gait Distance (Feet): 10 Feet Assistive device: Rolling walker (2 wheeled) Gait Pattern/deviations: Step-to pattern;Antalgic;Decreased weight shift to right     General Gait Details: Pt with moderately antalgic gait pattern, keeping very little pressure on her right knee, weight supported by left legs and bil arms, cues to stay inside of the handles to help better support R knee from pain and buckling.    Stairs             Wheelchair Mobility    Modified Rankin (Stroke Patients Only)       Balance Overall balance assessment: Needs assistance Sitting-balance support: Feet supported;Bilateral upper extremity supported Sitting balance-Leahy Scale: Fair     Standing balance support: Bilateral upper extremity supported Standing balance-Leahy Scale: Poor Standing balance comment: reliant on UE support from RW and therapist.            06/12/20 0001  Symptom Behavior  Subjective history of current problem Pt reports positional "swimmy headedness" mostly when coming to sitting or standing.    Type of Dizziness  Comment (swimmy headed)  Frequency of Dizziness intermittent  Duration of Dizziness short <1 min  Symptom Nature Motion provoked;Positional  Aggravating Factors Turning body quickly;Turning head quickly;Supine to sit;Sit to stand  Relieving Factors Head stationary;Lying supine;Slow movements  Progression of Symptoms Better  History of similar episodes none  Oculomotor Exam  Oculomotor Alignment Normal  Spontaneous Absent  Gaze-induced  Absent  Smooth Pursuits Intact  Saccades Intact  Oculomotor Exam-Fixation  Suppressed   Left Head Impulse  (deferred as she said she has a pinched nerve in her neck)  Right Head Impulse  (deferred as she said she has a pinched nerve in her neck)  Vestibulo-Ocular Reflex  VOR 1 Head Only (x 1 viewing) equally symptomatic and slow with both vertical and horizontal x1  Auditory  Comments grossly equal   Positional Testing  Dix-Hallpike Dix-Hallpike Right;Dix-Hallpike Left  Horizontal Canal Testing Horizontal Canal Right;Horizontal Canal Left  Dix-Hallpike Right  Dix-Hallpike Right Duration 0  Dix-Hallpike Right Symptoms No nystagmus  Dix-Hallpike Left  Dix-Hallpike Left Duration 0  Dix-Hallpike Left Symptoms No nystagmus  Horizontal Canal Right  Horizontal Canal Right Duration 0  Horizontal Canal Right Symptoms Normal  Horizontal Canal Left  Horizontal Canal Left Duration 0  Horizontal Canal Left Symptoms Normal  Orthostatics  Orthostatics Comment See previous PT note for negative orthostatics.    06/12/2020 I did not preform Epley's today as dix hallpike was negative bil.                     Cognition Arousal/Alertness: Awake/alert Behavior During Therapy: WFL for tasks assessed/performed Overall Cognitive Status: Within Functional Limits for tasks assessed                                        Exercises      General Comments        Pertinent Vitals/Pain Pain Assessment: Faces Faces Pain Scale: Hurts whole lot Pain Location: R knee bil feet/toes Pain Descriptors / Indicators: Grimacing;Guarding Pain Intervention(s): Limited activity within patient's tolerance;Monitored during session;Repositioned    Home Living                      Prior Function            PT Goals (current goals can now be found in the care plan section) Acute Rehab PT Goals Patient Stated Goal: to be safe and not fall Progress towards PT goals: Progressing toward goals    Frequency    Min 3X/week (more because she is vestibular)      PT Plan Current plan remains appropriate    Co-evaluation              AM-PAC PT "6 Clicks" Mobility   Outcome Measure  Help needed turning from your back to your side while in a flat bed without using bedrails?: A Little Help needed moving from lying on your back to sitting on the side of a flat bed  without using bedrails?: A Little Help needed moving to and from a bed to a chair (including a wheelchair)?: A Little Help needed standing up from a chair using your arms (e.g., wheelchair or bedside chair)?: A Little Help needed to walk in hospital room?: A Little Help needed climbing 3-5 steps with a railing? : A Lot 6 Click Score: 17    End of Session   Activity Tolerance: Patient limited by pain Patient left: in bed;with call bell/phone within reach;with bed alarm set Nurse Communication: Mobility status PT Visit Diagnosis: Unsteadiness on feet (R26.81);Muscle weakness (generalized) (M62.81);Difficulty in walking, not elsewhere classified (R26.2)     Time: 5701-7793 PT Time Calculation (min) (ACUTE ONLY): 44 min  Charges:  $Gait Training: 8-22 mins $Therapeutic Activity: 23-37 mins  Verdene Lennert, PT, DPT  Acute Rehabilitation [phone] pager [phone] office

## 2020-06-13 DIAGNOSIS — N1832 Chronic kidney disease, stage 3b: Secondary | ICD-10-CM | POA: Diagnosis not present

## 2020-06-13 DIAGNOSIS — I5032 Chronic diastolic (congestive) heart failure: Secondary | ICD-10-CM | POA: Diagnosis not present

## 2020-06-13 DIAGNOSIS — N179 Acute kidney failure, unspecified: Secondary | ICD-10-CM | POA: Diagnosis not present

## 2020-06-13 DIAGNOSIS — E854 Organ-limited amyloidosis: Secondary | ICD-10-CM | POA: Diagnosis not present

## 2020-06-13 LAB — BASIC METABOLIC PANEL
Anion gap: 9 (ref 5–15)
BUN: 30 mg/dL — ABNORMAL HIGH (ref 8–23)
CO2: 19 mmol/L — ABNORMAL LOW (ref 22–32)
Calcium: 8.7 mg/dL — ABNORMAL LOW (ref 8.9–10.3)
Chloride: 114 mmol/L — ABNORMAL HIGH (ref 98–111)
Creatinine, Ser: 2.78 mg/dL — ABNORMAL HIGH (ref 0.44–1.00)
GFR, Estimated: 17 mL/min — ABNORMAL LOW (ref >60)
Glucose, Bld: 108 mg/dL — ABNORMAL HIGH (ref 70–99)
Potassium: 3.9 mmol/L (ref 3.5–5.1)
Sodium: 142 mmol/L (ref 135–145)

## 2020-06-13 LAB — CK: Total CK: 569 U/L — ABNORMAL HIGH (ref 38–234)

## 2020-06-13 MED ORDER — POLYETHYLENE GLYCOL 3350 17 G PO PACK
17.0000 g | PACK | Freq: Every day | ORAL | Status: DC
  Administered 2020-06-13 – 2020-06-17 (×4): 17 g via ORAL
  Filled 2020-06-13 (×4): qty 1

## 2020-06-13 MED ORDER — GABAPENTIN 300 MG PO CAPS
300.0000 mg | ORAL_CAPSULE | Freq: Every day | ORAL | Status: DC
  Administered 2020-06-13 – 2020-06-17 (×5): 300 mg via ORAL
  Filled 2020-06-13 (×5): qty 1

## 2020-06-13 MED ORDER — SENNOSIDES-DOCUSATE SODIUM 8.6-50 MG PO TABS
1.0000 | ORAL_TABLET | Freq: Two times a day (BID) | ORAL | Status: DC
  Administered 2020-06-13 – 2020-06-17 (×8): 1 via ORAL
  Filled 2020-06-13 (×9): qty 1

## 2020-06-13 NOTE — TOC Progression Note (Signed)
Transition of Care [facility]) - Progression Note    Patient Details  Name: Katie [name] MRN: [id number] Date of Birth: 1945-09-27  Transition of Care [facility]) CM/SW Contact  Sharlet Salina Mila Homer, LCSW Phone Number: 06/13/2020, 6:22 PM  Clinical Narrative: Talked with daughter Katie [name] [phone]) regarding facility choice. Ms. Buckels indicated that she plans to visit SNF's before she makes a decision, this was discussed and CSW expressed understanding. Daughter added that she definitely wants to visit [facility] and [facility] SNF's.       Expected Discharge Plan: [place] Barriers to Discharge: Continued Medical Work up, SNF Pending bed offer  Expected Discharge Plan and Services Expected Discharge Plan: [place] Choice: [place] arrangements for the past 2 months: Single Family Home                                      Social Determinants of Health (SDOH) Interventions  No SDOH interventions requested or needed at this time.  Readmission Risk Interventions No flowsheet data found.

## 2020-06-13 NOTE — Care Management Important Message (Signed)
Important Message  Patient Details  Name: Katie [name] MRN: [id number] Date of Birth: [birth year]   Medicare Important Message Given:  Yes     Barb Merino [place] 06/13/2020, 11:18 AM

## 2020-06-13 NOTE — Evaluation (Signed)
Occupational Therapy Evaluation Patient Details Name: Katie [name] MRN: [id number] DOB: 01-Nov-1945 Today's Date: 06/13/2020    History of Present Illness 74 yo female with onset of light headed feeling and malaise was sent to hosp, admitted and noted elevation of creatinine and CK high.  Dx acute renal failure wiht elevated transaminase, CHF and amyloid disease. Has AKI and rhabdomyolysis.  PMHx: amyloid heart disease, stroke, CHF, CKD 3b, hypothyroidism,    Clinical Impression   PTA patient reports independent and driving. Admitted for above and limited by problem list below, including impaired balance, generalized weakness, decreased activity tolerance and pain in B feet/R knee.  Currently requires min assist for bed mobility and transfers, setup for UB ADLs and min-mod assist for LB ADLs.  Patient denies dizziness during session, but fatigues easily and requires increased time for all tasks.  She requires increased time for processing and min cueing for problem solving during session. She will benefit from continued OT services while admitted and after dc at SNF level to optimize independence, safety with ADLs, mobility prior to dc home.     Follow Up Recommendations  SNF;Supervision/Assistance - 24 hour    Equipment Recommendations  3 in 1 bedside commode    Recommendations for Other Services       Precautions / Restrictions Precautions Precautions: Fall Precaution Comments: R knee pain Restrictions Weight Bearing Restrictions: No      Mobility Bed Mobility Overal bed mobility: Needs Assistance Bed Mobility: Supine to Sit;Sit to Supine     Supine to sit: Min assist     General bed mobility comments: min assist for trunk support to fully ascend to EOB     Transfers Overall transfer level: Needs assistance Equipment used: Rolling walker (2 wheeled) Transfers: Sit to/from Stand Sit to Stand: Min assist;From elevated surface         General transfer comment: min  assist to power up and steady given cueing for hand placement and safety     Balance Overall balance assessment: Needs assistance Sitting-balance support: Feet supported;No upper extremity supported Sitting balance-Leahy Scale: Fair     Standing balance support: Bilateral upper extremity supported;During functional activity Standing balance-Leahy Scale: Poor Standing balance comment: relies on BUE and external support                            ADL either performed or assessed with clinical judgement   ADL Overall ADL's : Needs assistance/impaired     Grooming: Set up;Sitting   Upper Body Bathing: Set up;Sitting   Lower Body Bathing: Minimal assistance;Sit to/from stand   Upper Body Dressing : Set up;Sitting   Lower Body Dressing: Moderate assistance;Sit to/from stand Lower Body Dressing Details (indicate cue type and reason): requires assist for socks, relies on BUE support in standing, min assist sit to stand  Toilet Transfer: Minimal assistance;Ambulation;RW;BSC   Toileting- Clothing Manipulation and Hygiene: Minimal assistance;Sit to/from stand       Functional mobility during ADLs: Minimal assistance;Rolling walker;Cueing for safety General ADL Comments: pt limited by BLE feet/R knee pain, impaired balance, generalized weakness      Vision Baseline Vision/History: Wears glasses Wears Glasses: Reading only Patient Visual Report: No change from baseline Vision Assessment?: No apparent visual deficits     Perception     Praxis      Pertinent Vitals/Pain Pain Assessment: Faces Faces Pain Scale: Hurts whole lot Pain Location: R knee bil feet/toes Pain Descriptors / Indicators:  Grimacing;Guarding Pain Intervention(s): Limited activity within patient's tolerance;Monitored during session;Repositioned     Hand Dominance Right   Extremity/Trunk Assessment Upper Extremity Assessment Upper Extremity Assessment: Generalized weakness   Lower Extremity  Assessment Lower Extremity Assessment: Defer to PT evaluation   Cervical / Trunk Assessment Cervical / Trunk Assessment: Kyphotic   Communication Communication Communication: No difficulties   Cognition Arousal/Alertness: Awake/alert Behavior During Therapy: WFL for tasks assessed/performed Overall Cognitive Status: Impaired/Different from baseline Area of Impairment: Problem solving                             Problem Solving: Slow processing;Requires verbal cues General Comments: some slow processing and decreaesd problem sovling, continue to assess    General Comments       Exercises     Shoulder Instructions      Home Living Family/patient expects to be discharged to:: Private residence Living Arrangements: Alone Available Help at Discharge: Family;Friend(s);Available 24 hours/day Type of Home: Apartment Home Access: Stairs to enter Entrance Stairs-Number of Steps: 3 Entrance Stairs-Rails: Right Home Layout: One level     Bathroom Shower/Tub: Teacher, early years/pre: Standard     Home Equipment: Cane - single point;Shower seat          Prior Functioning/Environment Level of Independence: Independent with assistive device(s)        Comments: uses cane for mobility, independent ADLs, IADLs and driving         OT Problem List: Decreased strength;Decreased activity tolerance;Impaired balance (sitting and/or standing);Decreased knowledge of use of DME or AE;Decreased knowledge of precautions;Decreased safety awareness;Pain;Obesity      OT Treatment/Interventions: Self-care/ADL training;DME and/or AE instruction;Therapeutic activities;Patient/family education;Balance training;Therapeutic exercise    OT Goals(Current goals can be found in the care plan section) Acute Rehab OT Goals Patient Stated Goal: to be safe and not fall OT Goal Formulation: With patient Time For Goal Achievement: 06/27/20 Potential to Achieve Goals: Good ADL  Goals Pt Will Perform Grooming: standing;with supervision Pt Will Perform Lower Body Dressing: with supervision;with adaptive equipment;sit to/from stand Pt Will Transfer to Toilet: with supervision;ambulating;bedside commode Pt Will Perform Toileting - Clothing Manipulation and hygiene: with supervision;sit to/from stand  OT Frequency: Min 2X/week   Barriers to D/C:            Co-evaluation              AM-PAC OT "6 Clicks" Daily Activity     Outcome Measure Help from another person eating meals?: None Help from another person taking care of personal grooming?: A Little Help from another person toileting, which includes using toliet, bedpan, or urinal?: A Lot Help from another person bathing (including washing, rinsing, drying)?: A Little Help from another person to put on and taking off regular upper body clothing?: A Little Help from another person to put on and taking off regular lower body clothing?: A Lot 6 Click Score: 17   End of Session Equipment Utilized During Treatment: Gait belt;Rolling walker Nurse Communication: Mobility status  Activity Tolerance: Patient tolerated treatment well Patient left: in chair;with call bell/phone within reach;with chair alarm set  OT Visit Diagnosis: Other abnormalities of gait and mobility (R26.89);Muscle weakness (generalized) (M62.81);Pain Pain - Right/Left: Right Pain - part of body: Knee (Bil feet)                Time: 7169-6789 OT Time Calculation (min): 29 min Charges:  OT General Charges $OT Visit: 1  Visit OT Evaluation $OT Eval Moderate Complexity: 1 Mod OT Treatments $Self Care/Home Management : 8-22 mins  Jolaine Artist, OT Acute Rehabilitation Services Pager [phone] Office [phone]   Delight Stare 06/13/2020, 10:30 AM

## 2020-06-13 NOTE — Progress Notes (Signed)
Progress Note    Katie [name]  TML:[id number] DOB: 1946/03/06  DOA: 06/05/2020 PCP: Seward Carol, MD    Brief Narrative:    Medical records reviewed and are as summarized below:  Katie [name] is an 74 y.o. female with medical history significant for chronic systolic and diastolic CHF, moderate to severe MR, mitral clip, cerebellar stroke, chronic kidney disease stage IIIb, history of nephrectomy, history of CVA, cardiac amyloidosis and hypothyroidism, now presenting to emergency department for evaluation of general malaise, lightheadedness on standing, and increased creatinine on outpatient blood work.  Patient reports that she saw her PCP for evaluation of the aforementioned symptoms which began approximately 2 weeks ago, have blood work performed, and daughter was notified of her elevated creatinine and recommendation for evaluation in the emergency department.  Admitted with rhabdomyolysis, AKI and dizziness   Assessment/Plan:   Rhabdomyolysis -Suspect secondary to drug drug interactions in the setting of tafamidis and rosuvastatin, no other etiology, no falls, no trauma/ seizures, or strenuous exercise -CK improving with hydration -Statin discontinued, continue to trend  Acute kidney injury superimposed on CKD IIIb; metabolic acidosis - Pt reports remote nephrectomy due to cancer and presented with 2 weeks of general malaise, lightheadedness on standing, and increased creatinine on outpatient blood work  - SCr is 3.84 on admission, up from a baseline of 2 - Slowly improving, creatinine down to 2.7 today, will discontinue IV fluids  -UA with 100+ proteinuria, will need nephrology follow-up, discussed with Dr. Carolin Sicks from [facility], he will be happy to see her as outpatient  Elevated transaminases  -This is due to rhabdomyolysis -Improving   Chronic diastolic CHF; cardiac amyloidosis   - Appears compensated  - Has mild edema but lungs are clear, diuretics  on hold, continue tafamidis  Peripheral neuropathy -This could be secondary to amyloidosis as well -B12 is unremarkable  Hypothyroidism    - Continue Synthroid    History of CVA  - Continue ASA and Plavix, statin stopped due to rhabdomyolysis  Headache/dizziness -Orthostatics negative now but has been hydrated for several days, echo with preserved EF, mitral clips noted, mild MR -History of prior cerebellar stroke -Symptoms improving -PT eval completed, SNF recommended for short-term rehab  Rib pain -Tenderness over left lower lateral ribs -Do not suspect ACS -EKG w/o changes  Will need OSA work up outpatient  PT Eval-->SNF   Family Communication/Anticipated D/C date and plan/Code Status    DVT prophylaxis: heparin Code Status: Full Code Disposition Plan: Status is: inpt Family communication: Discussed with patient in detail, no family at bedside    Dispo: The patient is from: Home              Anticipated d/c is to: snf              Anticipated d/c date is: 11/23              Patient currently is not medically stable to d/c.     Medical Consultants:    None.    Subjective:   -Complains of pain and tingling in her legs, still has intermittent dizziness but overall better  Objective:    Vitals:   06/12/20 1731 06/12/20 2154 06/13/20 0521 06/13/20 0810  BP: (!) 111/59 115/67 107/74 133/89  Pulse: 75 81 73 81  Resp: 18 17 18 20   Temp: 99.2 F (37.3 C) 97.8 F (36.6 C) 97.7 F (36.5 C) 98.9 F (37.2 C)  TempSrc: Oral Oral Oral  Oral  SpO2: 98% 97% 97% 98%  Weight:      Height:        Intake/Output Summary (Last 24 hours) at 06/13/2020 1149 Last data filed at 06/13/2020 0800 Gross per 24 hour  Intake 1266.97 ml  Output 1650 ml  Net -383.03 ml   Filed Weights   06/10/20 0500 06/11/20 2011 06/12/20 0500  Weight: 74.3 kg 74.4 kg 74.4 kg    Exam General: Chronically ill elderly female sitting up in bed, AAOx3, no distress HEENT: No  JVD CVS: S1-S2, regular rate rhythm Lungs: Clear bilaterally Abdomen: Soft, nontender, bowel sounds present Extremities: Trace edema  Skin: no new rashes   Data Reviewed:   I have personally reviewed following labs and imaging studies:  Labs: Labs show the following:   Basic Metabolic Panel: Recent Labs  Lab 06/08/20 1125 06/08/20 1125 06/10/20 0231 06/10/20 0231 06/11/20 0140 06/11/20 0140 06/12/20 0054 06/13/20 0205  NA 144  --  144  --  147*  --  145 142  K 4.1   < > 3.8   < > 4.0   < > 3.9 3.9  CL 117*  --  119*  --  120*  --  118* 114*  CO2 19*  --  18*  --  19*  --  16* 19*  GLUCOSE 92  --  92  --  96  --  101* 108*  BUN 39*  --  34*  --  34*  --  31* 30*  CREATININE 3.48*  --  3.24*  --  3.10*  --  2.91* 2.78*  CALCIUM 9.2  --  8.8*  --  9.0  --  8.9 8.7*   < > = values in this interval not displayed.   GFR Estimated Creatinine Clearance: 16.9 mL/min (A) (by C-G formula based on SCr of 2.78 mg/dL (H)). Liver Function Tests: Recent Labs  Lab 06/07/20 0037 06/08/20 1125 06/10/20 0231 06/11/20 0140  AST 408* 249* 115* 86*  ALT 243* 228* 147* 130*  ALKPHOS 50 58 47 57  BILITOT 0.5 0.5 0.6 0.5  PROT 5.9* 6.2* 5.5* 5.6*  ALBUMIN 2.4* 2.5* 2.2* 2.3*   No results for input(s): LIPASE, AMYLASE in the last 168 hours. No results for input(s): AMMONIA in the last 168 hours. Coagulation profile No results for input(s): INR, PROTIME in the last 168 hours.  CBC: Recent Labs  Lab 06/07/20 0037 06/08/20 1551 06/10/20 0231 06/11/20 0140 06/12/20 0054  WBC 5.2 5.9 4.4 5.4 6.7  HGB 11.2* 11.3* 10.9* 10.7* 10.6*  HCT 35.2* 34.5* 34.0* 33.5* 33.0*  MCV 98.3 97.5 98.8 98.0 98.5  PLT 241 226 234 221 229   Cardiac Enzymes: Recent Labs  Lab 06/09/20 0247 06/10/20 0231 06/11/20 0140 06/12/20 0054 06/13/20 0205  CKTOTAL 5,830* 3,818* 2,627* 1,241* 569*   BNP (last 3 results) No results for input(s): PROBNP in the last 8760 hours. CBG: No results for  input(s): GLUCAP in the last 168 hours. D-Dimer: No results for input(s): DDIMER in the last 72 hours. Hgb A1c: No results for input(s): HGBA1C in the last 72 hours. Lipid Profile: No results for input(s): CHOL, HDL, LDLCALC, TRIG, CHOLHDL, LDLDIRECT in the last 72 hours. Thyroid function studies: No results for input(s): TSH, T4TOTAL, T3FREE, THYROIDAB in the last 72 hours.  Invalid input(s): FREET3 Anemia work up: Recent Labs    06/12/20 [place]   Sepsis Labs: Recent Labs  Lab 06/08/20 1551 06/10/20 0231 06/11/20 0140 06/12/20  0054  WBC 5.9 4.4 5.4 6.7    Microbiology Recent Results (from the past 240 hour(s))  Urine culture     Status: Abnormal   Collection Time: 06/05/20  7:43 PM   Specimen: Urine, Random  Result Value Ref Range Status   Specimen Description URINE, RANDOM  Final   Special Requests   Final    NONE Performed at [facility], [address] [place], [place] [address]    Culture MULTIPLE SPECIES PRESENT, SUGGEST RECOLLECTION (A)  Final   Report Status 06/06/2020 FINAL  Final  Culture, blood (routine x 2)     Status: None   Collection Time: 06/05/20  7:43 PM   Specimen: BLOOD  Result Value Ref Range Status   Specimen Description BLOOD SITE NOT SPECIFIED  Final   Special Requests   Final    BOTTLES DRAWN AEROBIC AND ANAEROBIC Blood Culture results may not be optimal due to an inadequate volume of blood received in culture bottles   Culture   Final    NO GROWTH 5 DAYS Performed at [facility], [place] [address], [place], [place] [address]    Report Status 06/10/2020 FINAL  Final  Respiratory Panel by RT PCR (Flu A&B, Covid) - Nasopharyngeal Swab     Status: None   Collection Time: 06/05/20  7:43 PM   Specimen: Nasopharyngeal Swab  Result Value Ref Range Status   SARS Coronavirus 2 by RT PCR NEGATIVE NEGATIVE Final    Comment: (NOTE) SARS-CoV-2 target nucleic acids are NOT DETECTED.  The SARS-CoV-2 RNA is generally  detectable in upper respiratoy specimens during the acute phase of infection. The lowest concentration of SARS-CoV-2 viral copies this assay can detect is 131 copies/mL. A negative result does not preclude SARS-Cov-2 infection and should not be used as the sole basis for treatment or other patient management decisions. A negative result may occur with  improper specimen collection/handling, submission of specimen other than nasopharyngeal swab, presence of viral mutation(s) within the areas targeted by this assay, and inadequate number of viral copies (<131 copies/mL). A negative result must be combined with clinical observations, patient history, and epidemiological information. The expected result is Negative.  Fact Sheet for Patients:  PinkCheek.be  Fact Sheet for Healthcare Providers:  GravelBags.it  This test is no t yet approved or cleared by the [place] FDA and  has been authorized for detection and/or diagnosis of SARS-CoV-2 by FDA under an Emergency Use Authorization (EUA). This EUA will remain  in effect (meaning this test can be used) for the duration of the COVID-19 declaration under Section 564(b)(1) of the Act, 21 U.S.C. section 360bbb-3(b)(1), unless the authorization is terminated or revoked sooner.     Influenza A by PCR NEGATIVE NEGATIVE Final   Influenza B by PCR NEGATIVE NEGATIVE Final    Comment: (NOTE) The Xpert Xpress SARS-CoV-2/FLU/RSV assay is intended as an aid in  the diagnosis of influenza from Nasopharyngeal swab specimens and  should not be used as a sole basis for treatment. Nasal washings and  aspirates are unacceptable for Xpert Xpress SARS-CoV-2/FLU/RSV  testing.  Fact Sheet for Patients: PinkCheek.be  Fact Sheet for Healthcare Providers: GravelBags.it  This test is not yet approved or cleared by the [place] FDA and   has been authorized for detection and/or diagnosis of SARS-CoV-2 by  FDA under an Emergency Use Authorization (EUA). This EUA will remain  in effect (meaning this test can be used) for the duration of the  Covid-19 declaration under Section 564(b)(1) of the Act, 21  U.S.C. section 360bbb-3(b)(1), unless the authorization is  terminated or revoked. Performed at [facility], [place] [address] [place], [place] [address]   Culture, blood (Routine X 2) w Reflex to ID Panel     Status: None   Collection Time: 06/05/20 11:41 PM   Specimen: BLOOD RIGHT FOREARM  Result Value Ref Range Status   Specimen Description BLOOD RIGHT FOREARM  Final   Special Requests   Final    BOTTLES DRAWN AEROBIC AND ANAEROBIC Blood Culture adequate volume   Culture   Final    NO GROWTH 5 DAYS Performed at [facility], [place] [address], [place], [place] [address]    Report Status 06/10/2020 FINAL  Final    Procedures and diagnostic studies:  ECHOCARDIOGRAM COMPLETE  Result Date: 06/11/2020    ECHOCARDIOGRAM REPORT   Patient Name:   GLENDIA OLSHEFSKI Date of Exam: 06/11/2020 Medical Rec #:  [id number]    Height:       63.0 in Accession #:    [id number]   Weight:       163.8 lb Date of Birth:  1946-02-16   BSA:          1.776 m Patient Age:    35 years     BP:           125/79 mmHg Patient Gender: F            HR:           79 bpm. Exam Location:  Inpatient Procedure: 2D Echo, Cardiac Doppler and Color Doppler Indications:    Dyspnea  History:        Patient has prior history of Echocardiogram examinations, most                 recent 01/23/2019. CHF, Stroke and Pulmonary HTN, Mitral Valve                 Disease and MV clip, MR, Signs/Symptoms:Dyspnea; Risk                 Factors:Hypertension and Dyslipidemia. Cardiac Amyloidosis.  Sonographer:    Dustin Flock Referring Phys: [place]  1. Left ventricular ejection fraction, by estimation, is 50 to 55%. The left ventricle has low normal  function. The left ventricle has no regional wall motion abnormalities. There is moderate left ventricular hypertrophy. Left ventricular diastolic parameters are indeterminate.  2. Right ventricular systolic function is normal. The right ventricular size is normal. There is mildly elevated pulmonary artery systolic pressure. The estimated right ventricular systolic pressure is 46.6 mmHg.  3. Left atrial size was severely dilated.  4. MitraClip in place, well seated. The mitral valve is normal in structure. Mild mitral valve regurgitation. The mean mitral valve gradient is 5.0 mmHg.  5. Tricuspid valve regurgitation is moderate to severe.  6. The aortic valve is normal in structure. Aortic valve regurgitation is not visualized. No aortic stenosis is present.  7. The inferior vena cava is normal in size with greater than 50% respiratory variability, suggesting right atrial pressure of 3 mmHg. Comparison(s): No significant change from prior study. Prior images reviewed side by side. FINDINGS  Left Ventricle: Left ventricular ejection fraction, by estimation, is 50 to 55%. The left ventricle has low normal function. The left ventricle has no regional wall motion abnormalities. The left ventricular internal cavity size was normal in size. There is moderate  left ventricular hypertrophy. Left ventricular diastolic parameters are indeterminate. Right Ventricle: The right ventricular size is normal. No increase in right ventricular wall thickness. Right ventricular systolic function is normal. There is mildly elevated pulmonary artery systolic pressure. The tricuspid regurgitant velocity is 3.00  m/s, and with an assumed right atrial pressure of 8 mmHg, the estimated right ventricular systolic pressure is 92.1 mmHg. Left Atrium: Left atrial size was severely dilated. Right Atrium: Right atrial size was normal in size. Pericardium: There is no evidence of pericardial effusion. Mitral Valve: MitraClip in place, well seated.  The mitral valve is normal in structure. Mild mitral valve regurgitation. MV peak gradient, 13.4 mmHg. The mean mitral valve gradient is 5.0 mmHg. Tricuspid Valve: The tricuspid valve is normal in structure. Tricuspid valve regurgitation is moderate to severe. No evidence of tricuspid stenosis. Aortic Valve: The aortic valve is normal in structure. Aortic valve regurgitation is not visualized. No aortic stenosis is present. Pulmonic Valve: The pulmonic valve was normal in structure. Pulmonic valve regurgitation is not visualized. No evidence of pulmonic stenosis. Aorta: The aortic root is normal in size and structure. Venous: The inferior vena cava is normal in size with greater than 50% respiratory variability, suggesting right atrial pressure of 3 mmHg. IAS/Shunts: No atrial level shunt detected by color flow Doppler.  LEFT VENTRICLE PLAX 2D LVIDd:         5.10 cm  Diastology LVIDs:         3.90 cm  LV e' medial:    4.62 cm/s LV PW:         1.50 cm  LV E/e' medial:  32.0 LV IVS:        1.60 cm  LV e' lateral:   3.97 cm/s LVOT diam:     2.00 cm  LV E/e' lateral: 37.3 LV SV:         60 LV SV Index:   34 LVOT Area:     3.14 cm  RIGHT VENTRICLE RV Basal diam:  3.60 cm RV S prime:     7.14 cm/s TAPSE (M-mode): 2.7 cm LEFT ATRIUM              Index       RIGHT ATRIUM           Index LA diam:        4.50 cm  2.53 cm/m  RA Area:     14.20 cm LA Vol (A2C):   97.5 ml  54.89 ml/m RA Volume:   34.00 ml  19.14 ml/m LA Vol (A4C):   119.0 ml 66.99 ml/m LA Biplane Vol: 109.0 ml 61.36 ml/m  AORTIC VALVE LVOT Vmax:   94.20 cm/s LVOT Vmean:  66.300 cm/s LVOT VTI:    0.190 m  AORTA Ao Root diam: 2.30 cm MITRAL VALVE                TRICUSPID VALVE MV Area (PHT): 2.69 cm     TR Peak grad:   36.0 mmHg MV Peak grad:  13.4 mmHg    TR Vmax:        300.00 cm/s MV Mean grad:  5.0 mmHg MV Vmax:       1.83 m/s     SHUNTS MV Vmean:      95.8 cm/s    Systemic VTI:  0.19 m MV Decel Time: 282 msec     Systemic Diam: 2.00 cm MV E velocity:  148.00 cm/s MV A velocity: 66.20 cm/s MV E/A  ratio:  2.24 Candee Furbish MD Electronically signed by Candee Furbish MD Signature Date/Time: 06/11/2020/3:55:34 PM    Final     Medications:   . aspirin EC  81 mg Oral Daily  . carvedilol  3.125 mg Oral BID WC  . clopidogrel  75 mg Oral Daily  . gabapentin  300 mg Oral Daily  . heparin  5,000 Units Subcutaneous Q8H  . levothyroxine  25 mcg Oral QAC breakfast  . lidocaine  1 patch Transdermal Q24H  . polyethylene glycol  17 g Oral Daily  . senna-docusate  1 tablet Oral BID  . sodium bicarbonate  650 mg Oral BID  . Tafamidis  61 mg Oral Daily   Continuous Infusions:    LOS: 7 days   Domenic Polite  [facility] 06/13/2020, 11:49 AM

## 2020-06-14 DIAGNOSIS — E854 Organ-limited amyloidosis: Secondary | ICD-10-CM | POA: Diagnosis not present

## 2020-06-14 DIAGNOSIS — I5032 Chronic diastolic (congestive) heart failure: Secondary | ICD-10-CM | POA: Diagnosis not present

## 2020-06-14 DIAGNOSIS — N1832 Chronic kidney disease, stage 3b: Secondary | ICD-10-CM | POA: Diagnosis not present

## 2020-06-14 DIAGNOSIS — N179 Acute kidney failure, unspecified: Secondary | ICD-10-CM | POA: Diagnosis not present

## 2020-06-14 LAB — CBC
HCT: 30.3 % — ABNORMAL LOW (ref 36.0–46.0)
Hemoglobin: 9.8 g/dL — ABNORMAL LOW (ref 12.0–15.0)
MCH: 32 pg (ref 26.0–34.0)
MCHC: 32.3 g/dL (ref 30.0–36.0)
MCV: 99 fL (ref 80.0–100.0)
Platelets: 224 10*3/uL (ref 150–400)
RBC: 3.06 MIL/uL — ABNORMAL LOW (ref 3.87–5.11)
RDW: 14.6 % (ref 11.5–15.5)
WBC: 6.4 10*3/uL (ref 4.0–10.5)
nRBC: 0 % (ref 0.0–0.2)

## 2020-06-14 LAB — BASIC METABOLIC PANEL
Anion gap: 7 (ref 5–15)
BUN: 32 mg/dL — ABNORMAL HIGH (ref 8–23)
CO2: 20 mmol/L — ABNORMAL LOW (ref 22–32)
Calcium: 8.6 mg/dL — ABNORMAL LOW (ref 8.9–10.3)
Chloride: 114 mmol/L — ABNORMAL HIGH (ref 98–111)
Creatinine, Ser: 2.81 mg/dL — ABNORMAL HIGH (ref 0.44–1.00)
GFR, Estimated: 17 mL/min — ABNORMAL LOW (ref >60)
Glucose, Bld: 94 mg/dL (ref 70–99)
Potassium: 3.9 mmol/L (ref 3.5–5.1)
Sodium: 141 mmol/L (ref 135–145)

## 2020-06-14 LAB — CK: Total CK: 342 U/L — ABNORMAL HIGH (ref 38–234)

## 2020-06-14 NOTE — Progress Notes (Signed)
Progress Note    Katie [name]  KNL:[id number] DOB: 10/04/1945  DOA: 06/05/2020 PCP: Seward Carol, MD    Brief Narrative:    Medical records reviewed and are as summarized below:  Katie [name] is an 74 y.o. female with medical history significant for chronic systolic and diastolic CHF, moderate to severe MR, mitral clip, cerebellar stroke, chronic kidney disease stage IIIb, history of nephrectomy, history of CVA, cardiac amyloidosis and hypothyroidism, now presenting to emergency department for evaluation of general malaise, lightheadedness on standing, and increased creatinine on outpatient blood work.  Patient reports that she saw her PCP for evaluation of the aforementioned symptoms which began approximately 2 weeks ago, have blood work performed, and daughter was notified of her elevated creatinine and recommendation for evaluation in the emergency department.  Admitted with rhabdomyolysis, AKI and dizziness   Assessment/Plan:   Rhabdomyolysis -Suspect secondary to drug drug interactions in the setting of tafamidis and rosuvastatin, no other etiology, no falls, no trauma/ seizures, or strenuous exercise -CK improving with hydration was 30 K on admission -Statin discontinued, continue to trend  Acute kidney injury superimposed on CKD IIIb; metabolic acidosis -  -h/o remote nephrectomy due to cancer and presented with 2 weeks of general malaise, lightheadedness on standing, and increased creatinine on outpatient blood work  - SCr is 3.9 on admission, up from a baseline of 2 - Slowly improving, creatinine down to 2.8 today, IV fluids discontinued, encourage increased p.o.   -UA with 100+ proteinuria, will need nephrology follow-up, could have amyloid kidney too, discussed with Dr. Carolin Sicks from [facility], he will be happy to see her as outpatient  Elevated transaminases  -This is due to rhabdomyolysis -Improving   Chronic diastolic CHF; cardiac amyloidosis    - Appears compensated  - Has mild edema but lungs are clear, diuretics on hold, continue tafamidis  Peripheral neuropathy -This could be secondary to amyloidosis as well -B12 is unremarkable -Started renal dose gabapentin  Hypothyroidism    - Continue Synthroid    History of CVA  - Continue ASA and Plavix, statin stopped due to rhabdomyolysis  Headache/dizziness -Orthostatics negative now but has been hydrated for several days, echo with preserved EF, mitral clips noted, mild MR -History of prior cerebellar stroke -Symptoms improving -PT eval completed, SNF recommended for short-term rehab  Rib pain -Tenderness over left lower lateral ribs -Do not suspect ACS -EKG w/o changes  Will need OSA work up outpatient  PT Eval-->SNF   Family Communication/Anticipated D/C date and plan/Code Status    DVT prophylaxis: heparin Code Status: Full Code Disposition Plan: Status is: inpt Family communication: Discussed with patient in detail, no family at bedside    Dispo: The patient is from: Home              Anticipated d/c is to: snf              Anticipated d/c date is: When SNF bed is available              Patient currently is medically stable to d/c.    Subjective:   -Feels a little better overall, continues to have pain and tingling in her legs but this is a little better after gabapentin, reports improvement in dizziness  Objective:    Vitals:   06/13/20 2146 06/14/20 0500 06/14/20 0504 06/14/20 0929  BP: 101/64  125/76 113/63  Pulse: 77  79 80  Resp: 18  16 17   Temp: 98  F (36.7 C)  97.8 F (36.6 C) 97.9 F (36.6 C)  TempSrc: Oral   Oral  SpO2: 97%  98% 97%  Weight:  75.5 kg    Height:        Intake/Output Summary (Last 24 hours) at 06/14/2020 1127 Last data filed at 06/14/2020 1696 Gross per 24 hour  Intake 600 ml  Output 1600 ml  Net -1000 ml   Filed Weights   06/11/20 2011 06/12/20 0500 06/14/20 0500  Weight: 74.4 kg 74.4 kg 75.5 kg     Exam General: Chronically ill elderly female sitting up in bed, AAOx3, no distress CVS: S1-S2, regular rate rhythm Lungs: Rare basilar rales otherwise clear Abdomen: Soft, nontender, bowel sounds present Extremities: Trace edema  Skin: no new rashes   Data Reviewed:   I have personally reviewed following labs and imaging studies:  Labs: Labs show the following:   Basic Metabolic Panel: Recent Labs  Lab 06/10/20 0231 06/10/20 0231 06/11/20 0140 06/11/20 0140 06/12/20 0054 06/12/20 0054 06/13/20 0205 06/14/20 0129  NA 144  --  147*  --  145  --  142 141  K 3.8   < > 4.0   < > 3.9   < > 3.9 3.9  CL 119*  --  120*  --  118*  --  114* 114*  CO2 18*  --  19*  --  16*  --  19* 20*  GLUCOSE 92  --  96  --  101*  --  108* 94  BUN 34*  --  34*  --  31*  --  30* 32*  CREATININE 3.24*  --  3.10*  --  2.91*  --  2.78* 2.81*  CALCIUM 8.8*  --  9.0  --  8.9  --  8.7* 8.6*   < > = values in this interval not displayed.   GFR Estimated Creatinine Clearance: 16.8 mL/min (A) (by C-G formula based on SCr of 2.81 mg/dL (H)). Liver Function Tests: Recent Labs  Lab 06/08/20 1125 06/10/20 0231 06/11/20 0140  AST 249* 115* 86*  ALT 228* 147* 130*  ALKPHOS 58 47 57  BILITOT 0.5 0.6 0.5  PROT 6.2* 5.5* 5.6*  ALBUMIN 2.5* 2.2* 2.3*   No results for input(s): LIPASE, AMYLASE in the last 168 hours. No results for input(s): AMMONIA in the last 168 hours. Coagulation profile No results for input(s): INR, PROTIME in the last 168 hours.  CBC: Recent Labs  Lab 06/08/20 1551 06/10/20 0231 06/11/20 0140 06/12/20 0054 06/14/20 0129  WBC 5.9 4.4 5.4 6.7 6.4  HGB 11.3* 10.9* 10.7* 10.6* 9.8*  HCT 34.5* 34.0* 33.5* 33.0* 30.3*  MCV 97.5 98.8 98.0 98.5 99.0  PLT 226 234 221 229 224   Cardiac Enzymes: Recent Labs  Lab 06/10/20 0231 06/11/20 0140 06/12/20 0054 06/13/20 0205 06/14/20 0129  CKTOTAL 3,818* 2,627* 1,241* 569* 342*   BNP (last 3 results) No results for  input(s): PROBNP in the last 8760 hours. CBG: No results for input(s): GLUCAP in the last 168 hours. D-Dimer: No results for input(s): DDIMER in the last 72 hours. Hgb A1c: No results for input(s): HGBA1C in the last 72 hours. Lipid Profile: No results for input(s): CHOL, HDL, LDLCALC, TRIG, CHOLHDL, LDLDIRECT in the last 72 hours. Thyroid function studies: No results for input(s): TSH, T4TOTAL, T3FREE, THYROIDAB in the last 72 hours.  Invalid input(s): FREET3 Anemia work up: Recent Labs    06/12/20 1223  VITAMINB12 282   Sepsis Labs: Recent  Labs  Lab 06/10/20 0231 06/11/20 0140 06/12/20 0054 06/14/20 0129  WBC 4.4 5.4 6.7 6.4    Microbiology Recent Results (from the past 240 hour(s))  Urine culture     Status: Abnormal   Collection Time: 06/05/20  7:43 PM   Specimen: Urine, Random  Result Value Ref Range Status   Specimen Description URINE, RANDOM  Final   Special Requests   Final    NONE Performed at [facility], [address] [place], [place] [address]    Culture MULTIPLE SPECIES PRESENT, SUGGEST RECOLLECTION (A)  Final   Report Status 06/06/2020 FINAL  Final  Culture, blood (routine x 2)     Status: None   Collection Time: 06/05/20  7:43 PM   Specimen: BLOOD  Result Value Ref Range Status   Specimen Description BLOOD SITE NOT SPECIFIED  Final   Special Requests   Final    BOTTLES DRAWN AEROBIC AND ANAEROBIC Blood Culture results may not be optimal due to an inadequate volume of blood received in culture bottles   Culture   Final    NO GROWTH 5 DAYS Performed at [facility], [place] [address] [place], [place] [address]    Report Status 06/10/2020 FINAL  Final  Respiratory Panel by RT PCR (Flu A&B, Covid) - Nasopharyngeal Swab     Status: None   Collection Time: 06/05/20  7:43 PM   Specimen: Nasopharyngeal Swab  Result Value Ref Range Status   SARS Coronavirus 2 by RT PCR NEGATIVE NEGATIVE Final    Comment: (NOTE) SARS-CoV-2 target nucleic  acids are NOT DETECTED.  The SARS-CoV-2 RNA is generally detectable in upper respiratoy specimens during the acute phase of infection. The lowest concentration of SARS-CoV-2 viral copies this assay can detect is 131 copies/mL. A negative result does not preclude SARS-Cov-2 infection and should not be used as the sole basis for treatment or other patient management decisions. A negative result may occur with  improper specimen collection/handling, submission of specimen other than nasopharyngeal swab, presence of viral mutation(s) within the areas targeted by this assay, and inadequate number of viral copies (<131 copies/mL). A negative result must be combined with clinical observations, patient history, and epidemiological information. The expected result is Negative.  Fact Sheet for Patients:  PinkCheek.be  Fact Sheet for Healthcare Providers:  GravelBags.it  This test is no t yet approved or cleared by the [place] FDA and  has been authorized for detection and/or diagnosis of SARS-CoV-2 by FDA under an Emergency Use Authorization (EUA). This EUA will remain  in effect (meaning this test can be used) for the duration of the COVID-19 declaration under Section 564(b)(1) of the Act, 21 U.S.C. section 360bbb-3(b)(1), unless the authorization is terminated or revoked sooner.     Influenza A by PCR NEGATIVE NEGATIVE Final   Influenza B by PCR NEGATIVE NEGATIVE Final    Comment: (NOTE) The Xpert Xpress SARS-CoV-2/FLU/RSV assay is intended as an aid in  the diagnosis of influenza from Nasopharyngeal swab specimens and  should not be used as a sole basis for treatment. Nasal washings and  aspirates are unacceptable for Xpert Xpress SARS-CoV-2/FLU/RSV  testing.  Fact Sheet for Patients: PinkCheek.be  Fact Sheet for Healthcare Providers: GravelBags.it  This test  is not yet approved or cleared by the [place] FDA and  has been authorized for detection and/or diagnosis of SARS-CoV-2 by  FDA under an Emergency Use Authorization (EUA). This EUA will remain  in effect (meaning  this test can be used) for the duration of the  Covid-19 declaration under Section 564(b)(1) of the Act, 21  U.S.C. section 360bbb-3(b)(1), unless the authorization is  terminated or revoked. Performed at [facility], [place] [address] [place], [place] [address]   Culture, blood (Routine X 2) w Reflex to ID Panel     Status: None   Collection Time: 06/05/20 11:41 PM   Specimen: BLOOD RIGHT FOREARM  Result Value Ref Range Status   Specimen Description BLOOD RIGHT FOREARM  Final   Special Requests   Final    BOTTLES DRAWN AEROBIC AND ANAEROBIC Blood Culture adequate volume   Culture   Final    NO GROWTH 5 DAYS Performed at [facility], [place] [address], [place], [place] [address]    Report Status 06/10/2020 FINAL  Final    Procedures and diagnostic studies:  No results found.  Medications:   . aspirin EC  81 mg Oral Daily  . carvedilol  3.125 mg Oral BID WC  . clopidogrel  75 mg Oral Daily  . gabapentin  300 mg Oral Daily  . heparin  5,000 Units Subcutaneous Q8H  . levothyroxine  25 mcg Oral QAC breakfast  . lidocaine  1 patch Transdermal Q24H  . polyethylene glycol  17 g Oral Daily  . senna-docusate  1 tablet Oral BID  . sodium bicarbonate  650 mg Oral BID  . Tafamidis  61 mg Oral Daily   Continuous Infusions:    LOS: 8 days   Domenic Polite  [facility] 06/14/2020, 11:27 AM

## 2020-06-15 DIAGNOSIS — N179 Acute kidney failure, unspecified: Secondary | ICD-10-CM | POA: Diagnosis not present

## 2020-06-15 DIAGNOSIS — I5032 Chronic diastolic (congestive) heart failure: Secondary | ICD-10-CM | POA: Diagnosis not present

## 2020-06-15 DIAGNOSIS — N1832 Chronic kidney disease, stage 3b: Secondary | ICD-10-CM | POA: Diagnosis not present

## 2020-06-15 DIAGNOSIS — E854 Organ-limited amyloidosis: Secondary | ICD-10-CM | POA: Diagnosis not present

## 2020-06-15 LAB — BASIC METABOLIC PANEL
Anion gap: 8 (ref 5–15)
BUN: 30 mg/dL — ABNORMAL HIGH (ref 8–23)
CO2: 21 mmol/L — ABNORMAL LOW (ref 22–32)
Calcium: 8.8 mg/dL — ABNORMAL LOW (ref 8.9–10.3)
Chloride: 115 mmol/L — ABNORMAL HIGH (ref 98–111)
Creatinine, Ser: 2.67 mg/dL — ABNORMAL HIGH (ref 0.44–1.00)
GFR, Estimated: 18 mL/min — ABNORMAL LOW (ref >60)
Glucose, Bld: 97 mg/dL (ref 70–99)
Potassium: 4 mmol/L (ref 3.5–5.1)
Sodium: 144 mmol/L (ref 135–145)

## 2020-06-15 LAB — CK: Total CK: 216 U/L (ref 38–234)

## 2020-06-15 NOTE — Progress Notes (Signed)
Progress Note    Katie [name]  ZOX:[id number] DOB: 07-26-1946  DOA: 06/05/2020 PCP: Seward Carol, MD    Brief Narrative:    Medical records reviewed and are as summarized below:  Katie [name] is an 74 y.o. female with medical history significant for chronic systolic and diastolic CHF, moderate to severe MR, mitral clip, cerebellar stroke, chronic kidney disease stage IIIb, history of nephrectomy, history of CVA, cardiac amyloidosis and hypothyroidism, now presenting to emergency department for evaluation of general malaise, lightheadedness on standing, and increased creatinine on outpatient blood work.  Patient reports that she saw her PCP for evaluation of the aforementioned symptoms which began approximately 2 weeks ago, have blood work performed, and daughter was notified of her elevated creatinine and recommendation for evaluation in the emergency department.  Admitted with rhabdomyolysis, AKI and dizziness   Assessment/Plan:   Rhabdomyolysis -Suspect secondary to drug drug interactions in the setting of tafamidis and rosuvastatin, no other etiology, no falls, no trauma/ seizures, or strenuous exercise -CK improving with hydration was 30 K on admission -Statin discontinued, continue to trend  Acute kidney injury superimposed on CKD IIIb; metabolic acidosis -  -h/o remote nephrectomy due to cancer and presented with 2 weeks of general malaise, lightheadedness on standing, and increased creatinine on outpatient blood work  - SCr is 3.9 on admission, up from a baseline of 2 - Creatinine slowly improving down to 2.6 today, IV fluids stopped 2 days ago    -UA with 100+ proteinuria, will need nephrology follow-up, could have amyloid kidney too, discussed with Dr. Carolin Sicks from [facility], he will be happy to see her as outpatient  Elevated transaminases  -Due to rhabdomyolysis, improved   Chronic diastolic CHF; cardiac amyloidosis   - Appears compensated  - Has  mild edema but lungs are clear, diuretics on hold, continue tafamidis  Peripheral neuropathy -This could be secondary to amyloidosis as well -B12 is unremarkable -Started renal dose gabapentin  Hypothyroidism    - Continue Synthroid    History of CVA  - Continue ASA and Plavix, statin stopped due to rhabdomyolysis  Headache/dizziness -Orthostatics negative now but has been hydrated for several days, echo with preserved EF, mitral clips noted, mild MR -History of prior cerebellar stroke -Symptoms improving -PT eval completed, SNF recommended for short-term rehab  Rib pain -Tenderness over left lower lateral ribs -Do not suspect ACS -EKG w/o changes  Will need OSA work up outpatient  PT Eval-->SNF   Family Communication/Anticipated D/C date and plan/Code Status    DVT prophylaxis: heparin Code Status: Full Code Disposition Plan: Status is: inpt Family communication: Discussed with patient in detail, no family at bedside    Dispo: The patient is from: Home              Anticipated d/c is to: snf              Anticipated d/c date is: When SNF bed is available              Patient currently is medically stable to d/c.    Subjective:   -Mild intermittent dizziness but overall improving, continues to have pain and tingling in her legs which is a little better  Objective:    Vitals:   06/15/20 0511 06/15/20 0848 06/15/20 0849 06/15/20 1046  BP: (!) 105/59 (!) 100/57 (!) 106/56 104/66  Pulse: 75  71   Resp: 18  20   Temp: 98 F (36.7 C)  97.6 F (36.4 C)   TempSrc: Oral  Oral   SpO2: 97%  98%   Weight:      Height:        Intake/Output Summary (Last 24 hours) at 06/15/2020 1407 Last data filed at 06/15/2020 0931 Gross per 24 hour  Intake 1016 ml  Output 1875 ml  Net -859 ml   Filed Weights   06/12/20 0500 06/14/20 0500 06/14/20 2053  Weight: 74.4 kg 75.5 kg 76.5 kg    Exam General: Chronically ill pleasant female sitting up in the recliner,  AAOx3, no distress CVS: S1-S2, regular rate rhythm Lungs: Decreased breath sounds the bases otherwise clear Abdomen: Soft, nontender, bowel sounds present Extremities: Trace edema Skin: no new rashes   Data Reviewed:   I have personally reviewed following labs and imaging studies:  Labs: Labs show the following:   Basic Metabolic Panel: Recent Labs  Lab 06/11/20 0140 06/11/20 0140 06/12/20 0054 06/12/20 0054 06/13/20 0205 06/13/20 0205 06/14/20 0129 06/15/20 0123  NA 147*  --  145  --  142  --  141 144  K 4.0   < > 3.9   < > 3.9   < > 3.9 4.0  CL 120*  --  118*  --  114*  --  114* 115*  CO2 19*  --  16*  --  19*  --  20* 21*  GLUCOSE 96  --  101*  --  108*  --  94 97  BUN 34*  --  31*  --  30*  --  32* 30*  CREATININE 3.10*  --  2.91*  --  2.78*  --  2.81* 2.67*  CALCIUM 9.0  --  8.9  --  8.7*  --  8.6* 8.8*   < > = values in this interval not displayed.   GFR Estimated Creatinine Clearance: 17.8 mL/min (A) (by C-G formula based on SCr of 2.67 mg/dL (H)). Liver Function Tests: Recent Labs  Lab 06/10/20 0231 06/11/20 0140  AST 115* 86*  ALT 147* 130*  ALKPHOS 47 57  BILITOT 0.6 0.5  PROT 5.5* 5.6*  ALBUMIN 2.2* 2.3*   No results for input(s): LIPASE, AMYLASE in the last 168 hours. No results for input(s): AMMONIA in the last 168 hours. Coagulation profile No results for input(s): INR, PROTIME in the last 168 hours.  CBC: Recent Labs  Lab 06/08/20 1551 06/10/20 0231 06/11/20 0140 06/12/20 0054 06/14/20 0129  WBC 5.9 4.4 5.4 6.7 6.4  HGB 11.3* 10.9* 10.7* 10.6* 9.8*  HCT 34.5* 34.0* 33.5* 33.0* 30.3*  MCV 97.5 98.8 98.0 98.5 99.0  PLT 226 234 221 229 224   Cardiac Enzymes: Recent Labs  Lab 06/11/20 0140 06/12/20 0054 06/13/20 0205 06/14/20 0129 06/15/20 0123  CKTOTAL 2,627* 1,241* 569* 342* 216   BNP (last 3 results) No results for input(s): PROBNP in the last 8760 hours. CBG: No results for input(s): GLUCAP in the last 168  hours. D-Dimer: No results for input(s): DDIMER in the last 72 hours. Hgb A1c: No results for input(s): HGBA1C in the last 72 hours. Lipid Profile: No results for input(s): CHOL, HDL, LDLCALC, TRIG, CHOLHDL, LDLDIRECT in the last 72 hours. Thyroid function studies: No results for input(s): TSH, T4TOTAL, T3FREE, THYROIDAB in the last 72 hours.  Invalid input(s): FREET3 Anemia work up: No results for input(s): VITAMINB12, FOLATE, FERRITIN, TIBC, IRON, RETICCTPCT in the last 72 hours. Sepsis Labs: Recent Labs  Lab 06/10/20 0231 06/11/20 0140 06/12/20 0054 06/14/20 0129  WBC 4.4 5.4 6.7 6.4    Microbiology Recent Results (from the past 240 hour(s))  Urine culture     Status: Abnormal   Collection Time: 06/05/20  7:43 PM   Specimen: Urine, Random  Result Value Ref Range Status   Specimen Description URINE, RANDOM  Final   Special Requests   Final    NONE Performed at [facility], [address] [place], [place] [address]    Culture MULTIPLE SPECIES PRESENT, SUGGEST RECOLLECTION (A)  Final   Report Status 06/06/2020 FINAL  Final  Culture, blood (routine x 2)     Status: None   Collection Time: 06/05/20  7:43 PM   Specimen: BLOOD  Result Value Ref Range Status   Specimen Description BLOOD SITE NOT SPECIFIED  Final   Special Requests   Final    BOTTLES DRAWN AEROBIC AND ANAEROBIC Blood Culture results may not be optimal due to an inadequate volume of blood received in culture bottles   Culture   Final    NO GROWTH 5 DAYS Performed at [facility], [place] [address] [place], [place] [address]    Report Status 06/10/2020 FINAL  Final  Respiratory Panel by RT PCR (Flu A&B, Covid) - Nasopharyngeal Swab     Status: None   Collection Time: 06/05/20  7:43 PM   Specimen: Nasopharyngeal Swab  Result Value Ref Range Status   SARS Coronavirus 2 by RT PCR NEGATIVE NEGATIVE Final    Comment: (NOTE) SARS-CoV-2 target nucleic acids are NOT DETECTED.  The SARS-CoV-2 RNA is  generally detectable in upper respiratoy specimens during the acute phase of infection. The lowest concentration of SARS-CoV-2 viral copies this assay can detect is 131 copies/mL. A negative result does not preclude SARS-Cov-2 infection and should not be used as the sole basis for treatment or other patient management decisions. A negative result may occur with  improper specimen collection/handling, submission of specimen other than nasopharyngeal swab, presence of viral mutation(s) within the areas targeted by this assay, and inadequate number of viral copies (<131 copies/mL). A negative result must be combined with clinical observations, patient history, and epidemiological information. The expected result is Negative.  Fact Sheet for Patients:  PinkCheek.be  Fact Sheet for Healthcare Providers:  GravelBags.it  This test is no t yet approved or cleared by the [place] FDA and  has been authorized for detection and/or diagnosis of SARS-CoV-2 by FDA under an Emergency Use Authorization (EUA). This EUA will remain  in effect (meaning this test can be used) for the duration of the COVID-19 declaration under Section 564(b)(1) of the Act, 21 U.S.C. section 360bbb-3(b)(1), unless the authorization is terminated or revoked sooner.     Influenza A by PCR NEGATIVE NEGATIVE Final   Influenza B by PCR NEGATIVE NEGATIVE Final    Comment: (NOTE) The Xpert Xpress SARS-CoV-2/FLU/RSV assay is intended as an aid in  the diagnosis of influenza from Nasopharyngeal swab specimens and  should not be used as a sole basis for treatment. Nasal washings and  aspirates are unacceptable for Xpert Xpress SARS-CoV-2/FLU/RSV  testing.  Fact Sheet for Patients: PinkCheek.be  Fact Sheet for Healthcare Providers: GravelBags.it  This test is not yet approved or cleared by the [place] FDA and  has been authorized for detection and/or diagnosis of SARS-CoV-2 by  FDA under an Emergency Use Authorization (EUA). This EUA will remain  in effect (meaning this test can be used) for the duration of the  Covid-19  declaration under Section 564(b)(1) of the Act, 21  U.S.C. section 360bbb-3(b)(1), unless the authorization is  terminated or revoked. Performed at [facility], [place] [address] [place], [place] [address]   Culture, blood (Routine X 2) w Reflex to ID Panel     Status: None   Collection Time: 06/05/20 11:41 PM   Specimen: BLOOD RIGHT FOREARM  Result Value Ref Range Status   Specimen Description BLOOD RIGHT FOREARM  Final   Special Requests   Final    BOTTLES DRAWN AEROBIC AND ANAEROBIC Blood Culture adequate volume   Culture   Final    NO GROWTH 5 DAYS Performed at [facility], [place] [address], [place], [place] [address]    Report Status 06/10/2020 FINAL  Final    Procedures and diagnostic studies:  No results found.  Medications:   . aspirin EC  81 mg Oral Daily  . carvedilol  3.125 mg Oral BID WC  . clopidogrel  75 mg Oral Daily  . gabapentin  300 mg Oral Daily  . heparin  5,000 Units Subcutaneous Q8H  . levothyroxine  25 mcg Oral QAC breakfast  . lidocaine  1 patch Transdermal Q24H  . polyethylene glycol  17 g Oral Daily  . senna-docusate  1 tablet Oral BID  . sodium bicarbonate  650 mg Oral BID  . Tafamidis  61 mg Oral Daily   Continuous Infusions:    LOS: 9 days   Domenic Polite  [facility] 06/15/2020, 2:07 PM

## 2020-06-15 NOTE — Care Plan (Signed)
Patient reported skin tear underneath left breast. Present prior to admission and outpatient physician made aware of skin tear. Assessed by the student nurse and RN made aware.

## 2020-06-15 NOTE — Progress Notes (Addendum)
Skin tear found under the left breast fold,0.3 cm x 6 cm .No drainage ,no smell.

## 2020-06-16 DIAGNOSIS — I5032 Chronic diastolic (congestive) heart failure: Secondary | ICD-10-CM | POA: Diagnosis not present

## 2020-06-16 DIAGNOSIS — N179 Acute kidney failure, unspecified: Secondary | ICD-10-CM | POA: Diagnosis not present

## 2020-06-16 DIAGNOSIS — E854 Organ-limited amyloidosis: Secondary | ICD-10-CM | POA: Diagnosis not present

## 2020-06-16 DIAGNOSIS — N1832 Chronic kidney disease, stage 3b: Secondary | ICD-10-CM | POA: Diagnosis not present

## 2020-06-16 LAB — BASIC METABOLIC PANEL
Anion gap: 10 (ref 5–15)
BUN: 26 mg/dL — ABNORMAL HIGH (ref 8–23)
CO2: 22 mmol/L (ref 22–32)
Calcium: 8.9 mg/dL (ref 8.9–10.3)
Chloride: 110 mmol/L (ref 98–111)
Creatinine, Ser: 2.5 mg/dL — ABNORMAL HIGH (ref 0.44–1.00)
GFR, Estimated: 20 mL/min — ABNORMAL LOW (ref >60)
Glucose, Bld: 113 mg/dL — ABNORMAL HIGH (ref 70–99)
Potassium: 3.8 mmol/L (ref 3.5–5.1)
Sodium: 142 mmol/L (ref 135–145)

## 2020-06-16 LAB — SARS CORONAVIRUS 2 BY RT PCR (HOSPITAL ORDER, PERFORMED IN ~~LOC~~ HOSPITAL LAB): SARS Coronavirus 2: NEGATIVE

## 2020-06-16 LAB — CK: Total CK: 144 U/L (ref 38–234)

## 2020-06-16 MED ORDER — GABAPENTIN 300 MG PO CAPS
300.0000 mg | ORAL_CAPSULE | Freq: Every day | ORAL | Status: DC
Start: 2020-06-17 — End: 2021-06-02

## 2020-06-16 MED ORDER — POTASSIUM CHLORIDE CRYS ER 20 MEQ PO TBCR: 20.0000 meq | EXTENDED_RELEASE_TABLET | Freq: Every day | ORAL | 0 refills | Status: AC

## 2020-06-16 MED ORDER — FUROSEMIDE 20 MG PO TABS
20.0000 mg | ORAL_TABLET | ORAL | Status: DC
Start: 2020-06-16 — End: 2020-07-17

## 2020-06-16 NOTE — Progress Notes (Signed)
Physical Therapy Treatment Patient Details Name: Katie [name] MRN: [id number] DOB: 1946-03-17 Today's Date: 06/16/2020    History of Present Illness 74 yo female with onset of light headed feeling and malaise was sent to hosp, admitted and noted elevation of creatinine and CK high.  Dx acute renal failure wiht elevated transaminase, CHF and amyloid disease. Has AKI and rhabdomyolysis.  PMHx: amyloid heart disease, stroke, CHF, CKD 3b, hypothyroidism,     PT Comments    Pt is progressing to hallway distance gait today, reports her right knee is better, but bil feet still hurt and are swollen.  Pt needed min assist overall for transfers and gait with RW.  She still feels she is far from her mod I/I baseline.  LE HEP reviewed and encouraged while up in the chair.  She still reports "swimmy headed" during mobility, continued vestibular assessment needed.    Follow Up Recommendations  SNF     Equipment Recommendations  None recommended by PT    Recommendations for Other Services       Precautions / Restrictions Precautions Precautions: Fall Precaution Comments: R knee pain, "swimmy headed"    Mobility  Bed Mobility               General bed mobility comments: Pt was OOB in the recliner chair.   Transfers Overall transfer level: Needs assistance Equipment used: Rolling walker (2 wheeled) Transfers: Sit to/from Stand Sit to Stand: Min guard         General transfer comment: Min guard assist for safety and balance from low recliner chair to RW. cues for safe hand placement.   Ambulation/Gait Ambulation/Gait assistance: Min assist Gait Distance (Feet): 55 Feet Assistive device: Rolling walker (2 wheeled) Gait Pattern/deviations: Step-through pattern;Shuffle;Trunk flexed Gait velocity: decreased Gait velocity interpretation: 1.31 - 2.62 ft/sec, indicative of limited community ambulator General Gait Details: cues to stay closer to RW handles, self cues to look up while  walking (not at her feet),    Stairs             Wheelchair Mobility    Modified Rankin (Stroke Patients Only)       Balance Overall balance assessment: Needs assistance Sitting-balance support: Feet supported;No upper extremity supported Sitting balance-Leahy Scale: Good     Standing balance support: Bilateral upper extremity supported Standing balance-Leahy Scale: Poor Standing balance comment: relies on BUE and external support                             Cognition Arousal/Alertness: Awake/alert Behavior During Therapy: WFL for tasks assessed/performed Overall Cognitive Status:  (not specifically tested, conversation normal)                                        Exercises General Exercises - Lower Extremity Ankle Circles/Pumps: AROM;Both;10 reps Long Arc Quad: AROM;Both;10 reps Hip ABduction/ADduction: AROM;Both;5 reps Straight Leg Raises: AROM;Both;5 reps Hip Flexion/Marching: AROM;Both;10 reps;Seated    General Comments        Pertinent Vitals/Pain Pain Assessment: Faces Faces Pain Scale: Hurts little more Pain Location: bil feet, R knee better today Pain Descriptors / Indicators: Grimacing;Guarding Pain Intervention(s): Limited activity within patient's tolerance;Monitored during session;Repositioned    Home Living                      Prior Function  PT Goals (current goals can now be found in the care plan section) Acute Rehab PT Goals Patient Stated Goal: to be safe and not fall Progress towards PT goals: Progressing toward goals    Frequency    Min 3X/week (more because she is vestibular)      PT Plan Current plan remains appropriate    Co-evaluation              AM-PAC PT "6 Clicks" Mobility   Outcome Measure  Help needed turning from your back to your side while in a flat bed without using bedrails?: A Little Help needed moving from lying on your back to sitting on the  side of a flat bed without using bedrails?: A Little Help needed moving to and from a bed to a chair (including a wheelchair)?: A Little Help needed standing up from a chair using your arms (e.g., wheelchair or bedside chair)?: A Little Help needed to walk in hospital room?: A Little Help needed climbing 3-5 steps with a railing? : A Lot 6 Click Score: 17    End of Session   Activity Tolerance: Patient limited by pain Patient left: in chair;with call bell/phone within reach;with chair alarm set   PT Visit Diagnosis: Unsteadiness on feet (R26.81);Muscle weakness (generalized) (M62.81);Difficulty in walking, not elsewhere classified (R26.2)     Time: 5681-2751 PT Time Calculation (min) (ACUTE ONLY): 17 min  Charges:  $Gait Training: 8-22 mins                     Verdene Lennert, PT, DPT  Acute Rehabilitation [phone] pager [phone]) [phone] office

## 2020-06-16 NOTE — Progress Notes (Signed)
Occupational Therapy Treatment Patient Details Name: Katie [name] MRN: [id number] DOB: 1946-01-01 Today's Date: 06/16/2020    History of present illness 74 yo female with onset of light headed feeling and malaise was sent to hosp, admitted and noted elevation of creatinine and CK high.  Dx acute renal failure wiht elevated transaminase, CHF and amyloid disease. Has AKI and rhabdomyolysis.  PMHx: amyloid heart disease, stroke, CHF, CKD 3b, hypothyroidism,    OT comments  Pt presents seated in recliner pleasant and willing to participate in therapy session. Pt performing room level mobility tasks with overall minA today. Pt practicing taking few steps to [facility] via HHA, further mobility using RW. Pt demonstrates toileting and standing grooming ADL with minA throughout for balance, but requiring at least modA for LB ADL at this time given difficulty reaching her feet/LEs. Feel SNF recommendation remains appropriate at this time. Will continue to follow acutely.    Follow Up Recommendations  SNF;Supervision/Assistance - 24 hour    Equipment Recommendations  3 in 1 bedside commode          Precautions / Restrictions Precautions Precautions: Fall Precaution Comments: R knee pain, "swimmy headed" Restrictions Weight Bearing Restrictions: No       Mobility Bed Mobility               General bed mobility comments: Pt was OOB in the recliner chair.   Transfers Overall transfer level: Needs assistance Equipment used: Rolling walker (2 wheeled);None Transfers: Sit to/from Stand Sit to Stand: Min guard         General transfer comment: for balance and safety; VCs for hand placement     Balance Overall balance assessment: Needs assistance Sitting-balance support: Feet supported;No upper extremity supported Sitting balance-Leahy Scale: Good     Standing balance support: Bilateral upper extremity supported Standing balance-Leahy Scale: Poor Standing balance comment: relies on  BUE and external support                            ADL either performed or assessed with clinical judgement   ADL Overall ADL's : Needs assistance/impaired     Grooming: Wash/dry hands;Minimal assistance;Standing         Lower Body Bathing Details (indicate cue type and reason): pt able to apply lotion to bil LEs when elevated in recliner       Lower Body Dressing Details (indicate cue type and reason): pt unable to reach her socks at this time requiring assist to adjust  Toilet Transfer: Minimal assistance;BSC;Stand-pivot Toilet Transfer Details (indicate cue type and reason): mobility approx 3' to [place] via HHA as pt wanted to practice walking without RW         Functional mobility during ADLs: Minimal assistance;Rolling walker (HHA To BSC, RW to sink in room)       Vision       Perception     Praxis      Cognition Arousal/Alertness: Awake/alert Behavior During Therapy: WFL for tasks assessed/performed Overall Cognitive Status:  (not specifically tested, conversation normal)                                          Exercises Exercises: General Lower Extremity General Exercises - Lower Extremity Ankle Circles/Pumps: AROM;Both;10 reps Long Arc Quad: AROM;Both;10 reps Hip ABduction/ADduction: AROM;Both;5 reps Straight Leg Raises: AROM;Both;5 reps  Hip Flexion/Marching: AROM;Both;10 reps;Seated   Shoulder Instructions       General Comments      Pertinent Vitals/ Pain       Pain Assessment: Faces Faces Pain Scale: No hurt Pain Location: bil feet, R knee better today Pain Descriptors / Indicators: Grimacing;Guarding Pain Intervention(s): Limited activity within patient's tolerance;Monitored during session;Repositioned  Home Living                                          Prior Functioning/Environment              Frequency  Min 2X/week        Progress Toward Goals  OT Goals(current goals  can now be found in the care plan section)  Progress towards OT goals: Progressing toward goals  Acute Rehab OT Goals Patient Stated Goal: to be safe and not fall OT Goal Formulation: With patient Time For Goal Achievement: 06/27/20 Potential to Achieve Goals: Good ADL Goals Pt Will Perform Grooming: standing;with supervision Pt Will Perform Lower Body Dressing: with supervision;with adaptive equipment;sit to/from stand Pt Will Transfer to Toilet: with supervision;ambulating;bedside commode Pt Will Perform Toileting - Clothing Manipulation and hygiene: with supervision;sit to/from stand  Plan Discharge plan remains appropriate    Co-evaluation                 AM-PAC OT "6 Clicks" Daily Activity     Outcome Measure   Help from another person eating meals?: None Help from another person taking care of personal grooming?: A Little Help from another person toileting, which includes using toliet, bedpan, or urinal?: A Lot Help from another person bathing (including washing, rinsing, drying)?: A Little Help from another person to put on and taking off regular upper body clothing?: A Little Help from another person to put on and taking off regular lower body clothing?: A Lot 6 Click Score: 17    End of Session Equipment Utilized During Treatment: Rolling walker  OT Visit Diagnosis: Other abnormalities of gait and mobility (R26.89);Muscle weakness (generalized) (M62.81);Pain Pain - Right/Left: Right Pain - part of body: Knee   Activity Tolerance Patient tolerated treatment well   Patient Left in chair;with call bell/phone within reach;with chair alarm set   Nurse Communication Mobility status        Time: 5638-7564 OT Time Calculation (min): 13 min  Charges: OT General Charges $OT Visit: 1 Visit OT Treatments $Self Care/Home Management : 8-22 mins  Lou Cal, OT Acute Rehabilitation Services Pager [phone] Office [place] 06/16/2020, 3:48 PM

## 2020-06-16 NOTE — Discharge Summary (Addendum)
Physician Discharge Summary  Katie [name] JXB:[id number] DOB: [birth year] DOA: 06/05/2020  PCP: Seward Carol, MD  Admit date: 06/05/2020 Discharge date: 06/17/2020  Time spent: 35 minutes  Recommendations for Outpatient Follow-up:  PCP Dr. [name] in 1 week, BMP in 1 week [facility] Dr. Hollie Salk in 2 weeks with labs Cardiology Dr. 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 female with medical history significant for chronic systolic and diastolic CHF, moderate to severe MR, mitral clip, cerebellar stroke, chronic kidney disease stage IIIb, history of nephrectomy, history of CVA, cardiac amyloidosis and hypothyroidism, now presenting to emergency department for evaluation of general malaise, lightheadedness on standing, and increased creatinine on outpatient blood work. Patient reports that she saw her PCP for evaluation of the aforementioned symptoms which began approximately 2 weeks ago, have blood work performed, and daughter was notified of her elevated creatinine and recommendation for evaluation in the emergency department.  Admitted with rhabdomyolysis, AKI and dizziness   Hospital Course:   Rhabdomyolysis -CK was 30 K in the setting of AKI on CKD stage IIIb   -suspect secondary to drug drug interactions in the setting of tafamidis and rosuvastatin(literature review noted several case reports) - no other etiology, no falls, no trauma/ seizures, or strenuous exercise -Hydrated with normal saline, statin discontinued, tafamidis was continued -CK has normalized  Acute kidney injury superimposed on CKD IIIb; metabolic acidosis- -h/o remote nephrectomy due to cancer and presented with 2 weeks of general malaise, lightheadedness on standing, and increased creatinine on outpatient blood work -Creatinine was 3.9 on admission, up from a baseline of 2 -This was felt to be secondary to severe rhabdomyolysis, CK of 30 K and dehydration -Creatinine slowly improving down to 2.5 today, IV fluids stopped 3 days ago    -UA with 100+ proteinuria, will need nephrology follow-up, 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 Hollie Salk with [facility]  Elevated transaminases -Due to rhabdomyolysis, improved  Chronic diastolic CHF; cardiac amyloidosis -Appears compensated -Initially was dry and orthostatic, diuretics held, and hydrated with IV fluids for 3 to 4 days -orthostatics improving, has mild edema but lungs are clear, restarted Lasix 20 mg every other day per home regimen at discharge, - continue tafamidis -Close follow-up with Dr. [name] recommended  Peripheral neuropathy -This could be secondary to amyloidosis as well -B12 is unremarkable -Started renal dose gabapentin  Hypothyroidism -Continue Synthroid  History of CVA -Continue ASA and Plavix, statin stopped due to severe rhabdomyolysis  Headache/dizziness -Orthostatics negative now but has been hydrated for several days, echo with preserved EF, mitral clips noted, mild MR -History of prior cerebellar stroke -Symptoms improving -PT eval completed, SNF recommended for short-term rehab   Discharge Exam: Vitals:   06/16/20 0403 06/16/20 0919  BP:  104/70 114/62  Pulse: 68 75  Resp:  16 18  Temp: 98.7 F (37.1 C) 98.2 F (36.8 C)  SpO2: 98% 99%    General: Awake alert oriented x3 Cardiovascular: S1-S2, regular rate rhythm Respiratory: Clear bilaterally  Discharge Instructions    Allergies as of 06/16/2020      Reactions   Tramadol Shortness Of Breath, Palpitations      Medication List    STOP taking these medications   rosuvastatin 40 MG tablet Commonly known as: CRESTOR     TAKE these medications   acetaminophen 500 MG tablet Commonly known as: TYLENOL Take 1,000 mg by mouth every 6 (six) hours as needed for headache (pain).   amoxicillin 500 MG tablet Commonly known as: AMOXIL Take 4 tablets (2,000 mg) 1 hour prior to dental procedures.   ARTIFICIAL TEARS OP Place 1 drop into both eyes daily as needed (dry eyes).   aspirin 81 MG tablet Take 1 tablet (81 mg total) daily by mouth.   butalbital-acetaminophen-caffeine 50-325-40 MG tablet Commonly known as: FIORICET Take 1 tablet by mouth every 12 (twelve) hours as needed for headache.   carvedilol 3.125 MG tablet Commonly known as: Coreg Take 1 tablet (3.125 mg total) by mouth 2 (two) times daily with a meal.   clopidogrel 75 MG tablet Commonly known as: PLAVIX Take 1 tablet (75 mg total) by mouth daily.   feeding supplement Liqd Take 237 mLs by mouth 2 (two) times daily between meals.   furosemide 20 MG tablet Commonly known as: LASIX Take 1 tablet (20 mg total) by mouth every other day What changed: when to take this   gabapentin 300 MG capsule Commonly known as: NEURONTIN Take 1 capsule (300 mg total) by mouth daily. Start taking on: June 17, 2020   levothyroxine 25 MCG tablet Commonly known as: SYNTHROID Take 25 mcg by mouth daily before breakfast.   potassium chloride SA 20 MEQ tablet Commonly known as: KLOR-CON Take 1 tablet (20 mEq total) by mouth daily. What changed: when to take this   Vyndamax 61 MG Caps Generic drug:  Tafamidis Take 61 mg by mouth daily.      Allergies  Allergen Reactions  . Tramadol Shortness Of Breath and Palpitations    Follow-up Information    Polite, Jori Moll, MD. Schedule an appointment as soon as possible for a visit in 1 week(s).   Specialty: Internal Medicine Contact information: [address] [place] [address] [phone]        Larey Dresser, MD Follow up in 2 day(s).   Specialty: Cardiology Contact information: [place] [address] [phone]        Madelon Lips, MD. Schedule an appointment as soon as possible for a visit in 1 month(s).   Specialty: Nephrology Contact information: [place] [address] [phone]                The results of significant diagnostics from this hospitalization (including imaging, microbiology, ancillary and laboratory) are listed below for reference.    Significant Diagnostic Studies: CT HEAD WO CONTRAST  Result Date: 06/09/2020 CLINICAL DATA:  Vertigo, central. Additional history provided: Vertigo when standing. EXAM: CT HEAD WITHOUT CONTRAST TECHNIQUE: Contiguous axial images were obtained from the base of the skull through the vertex without intravenous contrast. COMPARISON:  MRA head 12/03/2019.  Brain MRI 04/22/2017. FINDINGS: Brain: Mild generalized cerebral atrophy. Redemonstrated small chronic cortically based infarcts within the posterosuperior left temporal lobe and left parietal lobe. Background mild ill-defined hypoattenuation within  the cerebral white matter is nonspecific, but compatible with chronic small vessel ischemic disease. Redemonstrated chronic infarct within the left cerebellar hemisphere. There is no acute intracranial hemorrhage. No acute demarcated cortical infarct is identified. No extra-axial fluid collection. No evidence of intracranial mass. No midline shift. Vascular: No hyperdense vessel. Skull: Normal. Negative for fracture or focal  lesion. Sinuses/Orbits: Visualized orbits show no acute finding. Chronic medially displaced deformity of the right lamina papyracea. No significant paranasal sinus disease at the imaged levels. IMPRESSION: No evidence of acute intracranial abnormality. Redemonstrated small chronic cortically-based infarcts within the posterosuperior left temporal lobe and left parietal lobe. Redemonstrated chronic left cerebellar infarct. Background mild cerebral atrophy and chronic small vessel ischemic disease. Electronically Signed   By: Kellie Simmering DO   On: 06/09/2020 19:32   US Abdomen Complete  Result Date: 06/05/2020 CLINICAL DATA:  Elevated transaminases, stage III B chronic kidney disease, history of renal cell carcinoma post right nephrectomy. EXAM: ABDOMEN ULTRASOUND COMPLETE COMPARISON:  CT abdomen 10/17/2015, CT abdomen and pelvis 09/05/2009 FINDINGS: Gallbladder: No gallstones or wall thickening visualized. No sonographic Murphy sign noted by sonographer. Common bile duct: Diameter: 6.7 mm, within normal limits for patient age. Liver: Diminished hepatic echogenicity with some mild heterogeneity and a lobular surface contour. No focal liver lesion. No intrahepatic biliary ductal dilatation. Portal vein is patent on color Doppler imaging with normal direction of blood flow towards the liver. IVC: No abnormality visualized. Pancreas: Visualized portion unremarkable. Spleen: A bulbous contour anteriorly is similar to comparison CT imaging. Splenic size is within normal limits. No focal splenic lesion. Right Kidney: Surgically absent. No sonographic abnormality in the right renal fossa. Left Kidney: Length: 10.2 cm. Echogenicity is within normal limits. No concerning renal mass, shadowing calculus or hydronephrosis. Abdominal aorta: No aneurysm visualized. Other findings: None. IMPRESSION: 1. Diminished hepatic echogenicity with some mild heterogeneity and a lobular surface contour. Findings can be seen with intrinsic  liver disease/cirrhosis. 2. Status post right nephrectomy. No discernible sonographic abnormality in the right nephrectomy bed. 3. Otherwise unremarkable abdominal ultrasound. Electronically Signed   By: Lovena Le M.D.   On: 06/05/2020 23:45   DG Chest Portable 1 View  Result Date: 06/05/2020 CLINICAL DATA:  Altered mental status EXAM: PORTABLE CHEST 1 VIEW COMPARISON:  11/30/2017 FINDINGS: Heart is borderline in size. Loop recorder device projects over the left heart. Lingular atelectasis or scarring. Right lung clear. No effusions or edema. No acute bony abnormality. IMPRESSION: Lingular atelectasis or scarring.  No active disease. Electronically Signed   By: Rolm Baptise M.D.   On: 06/05/2020 20:16   ECHOCARDIOGRAM COMPLETE  Result Date: 06/11/2020    ECHOCARDIOGRAM REPORT   Patient Name:   Katie [name] Date of Exam: 06/11/2020 Medical Rec #:  [id number]    Height:       63.0 in Accession #:    [id number]   Weight:       163.8 lb Date of Birth:  [birth year]   BSA:          1.776 m Patient Age:    30 years     BP:           125/79 mmHg Patient Gender: F            HR:           79 bpm. Exam Location:  Inpatient Procedure: 2D Echo, Cardiac Doppler and Color Doppler Indications:    Dyspnea  History:  Patient has prior history of Echocardiogram examinations, most                 recent 01/23/2019. CHF, Stroke and Pulmonary HTN, Mitral Valve                 Disease and MV clip, MR, Signs/Symptoms:Dyspnea; Risk                 Factors:Hypertension and Dyslipidemia. Cardiac Amyloidosis.  Sonographer:    Dustin Flock Referring Phys: [place]  1. Left ventricular ejection fraction, by estimation, is 50 to 55%. The left ventricle has low normal function. The left ventricle has no regional wall motion abnormalities. There is moderate left ventricular hypertrophy. Left ventricular diastolic parameters are indeterminate.  2. Right ventricular systolic function is normal. The right  ventricular size is normal. There is mildly elevated pulmonary artery systolic pressure. The estimated right ventricular systolic pressure is 13.0 mmHg.  3. Left atrial size was severely dilated.  4. MitraClip in place, well seated. The mitral valve is normal in structure. Mild mitral valve regurgitation. The mean mitral valve gradient is 5.0 mmHg.  5. Tricuspid valve regurgitation is moderate to severe.  6. The aortic valve is normal in structure. Aortic valve regurgitation is not visualized. No aortic stenosis is present.  7. The inferior vena cava is normal in size with greater than 50% respiratory variability, suggesting right atrial pressure of 3 mmHg. Comparison(s): No significant change from prior study. Prior images reviewed side by side. FINDINGS  Left Ventricle: Left ventricular ejection fraction, by estimation, is 50 to 55%. The left ventricle has low normal function. The left ventricle has no regional wall motion abnormalities. The left ventricular internal cavity size was normal in size. There is moderate left ventricular hypertrophy. Left ventricular diastolic parameters are indeterminate. Right Ventricle: The right ventricular size is normal. No increase in right ventricular wall thickness. Right ventricular systolic function is normal. There is mildly elevated pulmonary artery systolic pressure. The tricuspid regurgitant velocity is 3.00  m/s, and with an assumed right atrial pressure of 8 mmHg, the estimated right ventricular systolic pressure is 86.5 mmHg. Left Atrium: Left atrial size was severely dilated. Right Atrium: Right atrial size was normal in size. Pericardium: There is no evidence of pericardial effusion. Mitral Valve: MitraClip in place, well seated. The mitral valve is normal in structure. Mild mitral valve regurgitation. MV peak gradient, 13.4 mmHg. The mean mitral valve gradient is 5.0 mmHg. Tricuspid Valve: The tricuspid valve is normal in structure. Tricuspid valve regurgitation is  moderate to severe. No evidence of tricuspid stenosis. Aortic Valve: The aortic valve is normal in structure. Aortic valve regurgitation is not visualized. No aortic stenosis is present. Pulmonic Valve: The pulmonic valve was normal in structure. Pulmonic valve regurgitation is not visualized. No evidence of pulmonic stenosis. Aorta: The aortic root is normal in size and structure. Venous: The inferior vena cava is normal in size with greater than 50% respiratory variability, suggesting right atrial pressure of 3 mmHg. IAS/Shunts: No atrial level shunt detected by color flow Doppler.  LEFT VENTRICLE PLAX 2D LVIDd:         5.10 cm  Diastology LVIDs:         3.90 cm  LV e' medial:    4.62 cm/s LV PW:         1.50 cm  LV E/e' medial:  32.0 LV IVS:        1.60 cm  LV e' lateral:  3.97 cm/s LVOT diam:     2.00 cm  LV E/e' lateral: 37.3 LV SV:         60 LV SV Index:   34 LVOT Area:     3.14 cm  RIGHT VENTRICLE RV Basal diam:  3.60 cm RV S prime:     7.14 cm/s TAPSE (M-mode): 2.7 cm LEFT ATRIUM              Index       RIGHT ATRIUM           Index LA diam:        4.50 cm  2.53 cm/m  RA Area:     14.20 cm LA Vol (A2C):   97.5 ml  54.89 ml/m RA Volume:   34.00 ml  19.14 ml/m LA Vol (A4C):   119.0 ml 66.99 ml/m LA Biplane Vol: 109.0 ml 61.36 ml/m  AORTIC VALVE LVOT Vmax:   94.20 cm/s LVOT Vmean:  66.300 cm/s LVOT VTI:    0.190 m  AORTA Ao Root diam: 2.30 cm MITRAL VALVE                TRICUSPID VALVE MV Area (PHT): 2.69 cm     TR Peak grad:   36.0 mmHg MV Peak grad:  13.4 mmHg    TR Vmax:        300.00 cm/s MV Mean grad:  5.0 mmHg MV Vmax:       1.83 m/s     SHUNTS MV Vmean:      95.8 cm/s    Systemic VTI:  0.19 m MV Decel Time: 282 msec     Systemic Diam: 2.00 cm MV E velocity: 148.00 cm/s MV A velocity: 66.20 cm/s MV E/A ratio:  2.24 Candee Furbish MD Electronically signed by Candee Furbish MD Signature Date/Time: 06/11/2020/3:55:34 PM    Final    CUP PACEART REMOTE DEVICE CHECK  Result Date: 06/04/2020 ILR summary  report received. Battery status OK. Normal device function. No new symptom, tachy, brady, or pause episodes. No new AF episodes. Monthly summary reports and ROV/PRN Kathy Breach, RN, CCDS, [employer]   Microbiology: No results found for this or any previous visit (from the past 240 hour(s)).   Labs: Basic Metabolic Panel: Recent Labs  Lab 06/12/20 0054 06/13/20 0205 06/14/20 0129 06/15/20 0123 06/16/20 0846  NA 145 142 141 144 142  K 3.9 3.9 3.9 4.0 3.8  CL 118* 114* 114* 115* 110  CO2 16* 19* 20* 21* 22  GLUCOSE 101* 108* 94 97 113*  BUN 31* 30* 32* 30* 26*  CREATININE 2.91* 2.78* 2.81* 2.67* 2.50*  CALCIUM 8.9 8.7* 8.6* 8.8* 8.9   Liver Function Tests: Recent Labs  Lab 06/10/20 0231 06/11/20 0140  AST 115* 86*  ALT 147* 130*  ALKPHOS 47 57  BILITOT 0.6 0.5  PROT 5.5* 5.6*  ALBUMIN 2.2* 2.3*   No results for input(s): LIPASE, AMYLASE in the last 168 hours. No results for input(s): AMMONIA in the last 168 hours. CBC: Recent Labs  Lab 06/10/20 0231 06/11/20 0140 06/12/20 0054 06/14/20 0129  WBC 4.4 5.4 6.7 6.4  HGB 10.9* 10.7* 10.6* 9.8*  HCT 34.0* 33.5* 33.0* 30.3*  MCV 98.8 98.0 98.5 99.0  PLT 234 221 229 224   Cardiac Enzymes: Recent Labs  Lab 06/12/20 0054 06/13/20 0205 06/14/20 0129 06/15/20 0123 06/16/20 0303  CKTOTAL 1,241* 569* 342* 216 144   BNP: BNP (last 3 results) No results for  input(s): BNP in the last 8760 hours.  ProBNP (last 3 results) No results for input(s): PROBNP in the last 8760 hours.  CBG: No results for input(s): GLUCAP in the last 168 hours.     Signed:  Domenic Polite MD.  [facility] 06/16/2020, 11:08 AM

## 2020-06-16 NOTE — Plan of Care (Signed)
  Problem: Health Behavior/Discharge Planning: Goal: Ability to manage health-related needs will improve Outcome: Progressing   Problem: Clinical Measurements: Goal: Ability to maintain clinical measurements within normal limits will improve Outcome: Progressing   

## 2020-06-16 NOTE — TOC Progression Note (Addendum)
Transition of Care [facility]) - Progression Note    Patient Details  Name: Katie [name] MRN: [id number] Date of Birth: 1945-11-24  Transition of [facility]) CM/SW Contact  Sharlet Salina Mila Homer, LCSW Phone Number: 06/16/2020, 11:33 AM  Clinical Narrative:  Call made to daughter Casie Sturgeon [phone]) regarding facility choice and daughter requesting [facility]. Contacted [facility] as patient is medically stable for discharge. CSW advised that authorization expired on the 12th and additional therapy note(s) and updated MD progress note needed. PT/OT office contacted and CSW informed by Jarrett Soho that PT scheduled to see patient today. Call made to [facility] with [facility] regarding patient and they can take her, pending insurance auth.   Additional therapy notes and MD progress notes faxed to [facility] this afternoon. CSW will continue to follow and provide SW intervention services through discharge to [facility].       Expected Discharge Plan: [place] Barriers to Discharge: Continued Medical Work up, SNF Pending bed offer  Expected Discharge Plan and Services Expected Discharge Plan: [place] Choice: [place] arrangements for the past 2 months: Single Family Home                                       Social Determinants of Health (SDOH) Interventions    Readmission Risk Interventions No flowsheet data found.

## 2020-06-16 NOTE — Progress Notes (Deleted)
Physician Discharge Summary  Katie [name] YJE:[id number] DOB: 11/21/1945 DOA: 06/05/2020  PCP: Seward Carol, MD  Admit date: 06/05/2020 Discharge date: 06/16/2020  Time spent: 35 minutes  Recommendations for Outpatient Follow-up:  PCP Dr. [name] in 1 week, BMP in 1 week [facility] Dr. Hollie Salk in 2 weeks with labs Cardiology Dr. 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 female with medical history significant for chronic systolic and diastolic CHF, moderate to severe MR, mitral clip, cerebellar stroke, chronic kidney disease stage IIIb, history of nephrectomy, history of CVA, cardiac amyloidosis and hypothyroidism, now presenting to emergency department for evaluation of general malaise, lightheadedness on standing, and increased creatinine on outpatient blood work. Patient reports that she saw her PCP for evaluation of the aforementioned symptoms which began approximately 2 weeks ago, have blood work performed, and daughter was notified of her elevated creatinine and recommendation for evaluation in the emergency department.  Admitted with rhabdomyolysis, AKI and dizziness   Hospital Course:   Rhabdomyolysis -CK was 30 K in the setting of AKI on CKD stage IIIb   -suspect secondary to drug drug interactions in the setting of tafamidis and rosuvastatin(literature review noted several case reports) - no other etiology, no falls, no trauma/ seizures, or strenuous exercise -Hydrated with normal saline, statin discontinued, tafamidis was continued -CK has normalized  Acute kidney injury superimposed on CKD IIIb; metabolic acidosis- -h/o remote nephrectomy due to cancer and presented with 2 weeks of general malaise, lightheadedness on standing, and increased creatinine on outpatient blood work -Creatinine was 3.9 on admission, up from a baseline of 2 -This was felt to be secondary to severe rhabdomyolysis, CK of 30 K and dehydration -Creatinine slowly improving down to 2.5 today, IV fluids stopped 3 days ago    -UA with 100+ proteinuria, will need nephrology follow-up, 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 Hollie Salk with [facility]  Elevated transaminases -Due to rhabdomyolysis, improved  Chronic diastolic CHF; cardiac amyloidosis -Appears compensated -Initially was dry and orthostatic, diuretics held, and hydrated with IV fluids for 3 to 4 days -orthostatics improving, has mild edema but lungs are clear, restarted Lasix 20 mg every other day per home regimen at discharge, - continue tafamidis -Close follow-up with Dr. [name] recommended  Peripheral neuropathy -This could be secondary to amyloidosis as well -B12 is unremarkable -Started renal dose gabapentin  Hypothyroidism -Continue Synthroid  History of CVA -Continue ASA and Plavix, statin stopped due to severe rhabdomyolysis  Headache/dizziness -Orthostatics negative now but has been hydrated for several days, echo with preserved EF, mitral clips noted, mild MR -History of prior cerebellar stroke -Symptoms improving -PT eval completed, SNF recommended for short-term rehab   Discharge Exam: Vitals:   06/16/20 0403 06/16/20 0919  BP:  104/70 114/62  Pulse: 68 75  Resp:  16 18  Temp: 98.7 F (37.1 C) 98.2 F (36.8 C)  SpO2: 98% 99%    General: Awake alert oriented x3 Cardiovascular: S1-S2, regular rate rhythm Respiratory: Clear bilaterally  Discharge Instructions    Allergies as of 06/16/2020      Reactions   Tramadol Shortness Of Breath, Palpitations      Medication List    STOP taking these medications   rosuvastatin 40 MG tablet Commonly known as: CRESTOR     TAKE these medications   acetaminophen 500 MG tablet Commonly known as: TYLENOL Take 1,000 mg by mouth every 6 (six) hours as needed for headache (pain).   amoxicillin 500 MG tablet Commonly known as: AMOXIL Take 4 tablets (2,000 mg) 1 hour prior to dental procedures.   ARTIFICIAL TEARS OP Place 1 drop into both eyes daily as needed (dry eyes).   aspirin 81 MG tablet Take 1 tablet (81 mg total) daily by mouth.   butalbital-acetaminophen-caffeine 50-325-40 MG tablet Commonly known as: FIORICET Take 1 tablet by mouth every 12 (twelve) hours as needed for headache.   carvedilol 3.125 MG tablet Commonly known as: Coreg Take 1 tablet (3.125 mg total) by mouth 2 (two) times daily with a meal.   clopidogrel 75 MG tablet Commonly known as: PLAVIX Take 1 tablet (75 mg total) by mouth daily.   feeding supplement Liqd Take 237 mLs by mouth 2 (two) times daily between meals.   furosemide 20 MG tablet Commonly known as: LASIX Take 1 tablet (20 mg total) by mouth every other day What changed: when to take this   gabapentin 300 MG capsule Commonly known as: NEURONTIN Take 1 capsule (300 mg total) by mouth daily. Start taking on: June 17, 2020   levothyroxine 25 MCG tablet Commonly known as: SYNTHROID Take 25 mcg by mouth daily before breakfast.   potassium chloride SA 20 MEQ tablet Commonly known as: KLOR-CON Take 1 tablet (20 mEq total) by mouth daily. What changed: when to take this   Vyndamax 61 MG Caps Generic drug:  Tafamidis Take 61 mg by mouth daily.      Allergies  Allergen Reactions  . Tramadol Shortness Of Breath and Palpitations    Follow-up Information    Polite, Jori Moll, MD. Schedule an appointment as soon as possible for a visit in 1 week(s).   Specialty: Internal Medicine Contact information: [address] [place] [address] [phone]        Larey Dresser, MD Follow up in 2 day(s).   Specialty: Cardiology Contact information: [place] [address] [phone]        Madelon Lips, MD. Schedule an appointment as soon as possible for a visit in 1 month(s).   Specialty: Nephrology Contact information: [place] [address] [phone]                The results of significant diagnostics from this hospitalization (including imaging, microbiology, ancillary and laboratory) are listed below for reference.    Significant Diagnostic Studies: CT HEAD WO CONTRAST  Result Date: 06/09/2020 CLINICAL DATA:  Vertigo, central. Additional history provided: Vertigo when standing. EXAM: CT HEAD WITHOUT CONTRAST TECHNIQUE: Contiguous axial images were obtained from the base of the skull through the vertex without intravenous contrast. COMPARISON:  MRA head 12/03/2019.  Brain MRI 04/22/2017. FINDINGS: Brain: Mild generalized cerebral atrophy. Redemonstrated small chronic cortically based infarcts within the posterosuperior left temporal lobe and left parietal lobe. Background mild ill-defined hypoattenuation within  the cerebral white matter is nonspecific, but compatible with chronic small vessel ischemic disease. Redemonstrated chronic infarct within the left cerebellar hemisphere. There is no acute intracranial hemorrhage. No acute demarcated cortical infarct is identified. No extra-axial fluid collection. No evidence of intracranial mass. No midline shift. Vascular: No hyperdense vessel. Skull: Normal. Negative for fracture or focal  lesion. Sinuses/Orbits: Visualized orbits show no acute finding. Chronic medially displaced deformity of the right lamina papyracea. No significant paranasal sinus disease at the imaged levels. IMPRESSION: No evidence of acute intracranial abnormality. Redemonstrated small chronic cortically-based infarcts within the posterosuperior left temporal lobe and left parietal lobe. Redemonstrated chronic left cerebellar infarct. Background mild cerebral atrophy and chronic small vessel ischemic disease. Electronically Signed   By: Kellie Simmering DO   On: 06/09/2020 19:32   US Abdomen Complete  Result Date: 06/05/2020 CLINICAL DATA:  Elevated transaminases, stage III B chronic kidney disease, history of renal cell carcinoma post right nephrectomy. EXAM: ABDOMEN ULTRASOUND COMPLETE COMPARISON:  CT abdomen 10/17/2015, CT abdomen and pelvis 09/05/2009 FINDINGS: Gallbladder: No gallstones or wall thickening visualized. No sonographic Murphy sign noted by sonographer. Common bile duct: Diameter: 6.7 mm, within normal limits for patient age. Liver: Diminished hepatic echogenicity with some mild heterogeneity and a lobular surface contour. No focal liver lesion. No intrahepatic biliary ductal dilatation. Portal vein is patent on color Doppler imaging with normal direction of blood flow towards the liver. IVC: No abnormality visualized. Pancreas: Visualized portion unremarkable. Spleen: A bulbous contour anteriorly is similar to comparison CT imaging. Splenic size is within normal limits. No focal splenic lesion. Right Kidney: Surgically absent. No sonographic abnormality in the right renal fossa. Left Kidney: Length: 10.2 cm. Echogenicity is within normal limits. No concerning renal mass, shadowing calculus or hydronephrosis. Abdominal aorta: No aneurysm visualized. Other findings: None. IMPRESSION: 1. Diminished hepatic echogenicity with some mild heterogeneity and a lobular surface contour. Findings can be seen with intrinsic  liver disease/cirrhosis. 2. Status post right nephrectomy. No discernible sonographic abnormality in the right nephrectomy bed. 3. Otherwise unremarkable abdominal ultrasound. Electronically Signed   By: Lovena Le M.D.   On: 06/05/2020 23:45   DG Chest Portable 1 View  Result Date: 06/05/2020 CLINICAL DATA:  Altered mental status EXAM: PORTABLE CHEST 1 VIEW COMPARISON:  11/30/2017 FINDINGS: Heart is borderline in size. Loop recorder device projects over the left heart. Lingular atelectasis or scarring. Right lung clear. No effusions or edema. No acute bony abnormality. IMPRESSION: Lingular atelectasis or scarring.  No active disease. Electronically Signed   By: Rolm Baptise M.D.   On: 06/05/2020 20:16   ECHOCARDIOGRAM COMPLETE  Result Date: 06/11/2020    ECHOCARDIOGRAM REPORT   Patient Name:   Katie [name] Date of Exam: 06/11/2020 Medical Rec #:  [id number]    Height:       63.0 in Accession #:    [id number]   Weight:       163.8 lb Date of Birth:  October 25, 1945   BSA:          1.776 m Patient Age:    2 years     BP:           125/79 mmHg Patient Gender: F            HR:           79 bpm. Exam Location:  Inpatient Procedure: 2D Echo, Cardiac Doppler and Color Doppler Indications:    Dyspnea  History:  Patient has prior history of Echocardiogram examinations, most                 recent 01/23/2019. CHF, Stroke and Pulmonary HTN, Mitral Valve                 Disease and MV clip, MR, Signs/Symptoms:Dyspnea; Risk                 Factors:Hypertension and Dyslipidemia. Cardiac Amyloidosis.  Sonographer:    Dustin Flock Referring Phys: [place]  1. Left ventricular ejection fraction, by estimation, is 50 to 55%. The left ventricle has low normal function. The left ventricle has no regional wall motion abnormalities. There is moderate left ventricular hypertrophy. Left ventricular diastolic parameters are indeterminate.  2. Right ventricular systolic function is normal. The right  ventricular size is normal. There is mildly elevated pulmonary artery systolic pressure. The estimated right ventricular systolic pressure is 08.6 mmHg.  3. Left atrial size was severely dilated.  4. MitraClip in place, well seated. The mitral valve is normal in structure. Mild mitral valve regurgitation. The mean mitral valve gradient is 5.0 mmHg.  5. Tricuspid valve regurgitation is moderate to severe.  6. The aortic valve is normal in structure. Aortic valve regurgitation is not visualized. No aortic stenosis is present.  7. The inferior vena cava is normal in size with greater than 50% respiratory variability, suggesting right atrial pressure of 3 mmHg. Comparison(s): No significant change from prior study. Prior images reviewed side by side. FINDINGS  Left Ventricle: Left ventricular ejection fraction, by estimation, is 50 to 55%. The left ventricle has low normal function. The left ventricle has no regional wall motion abnormalities. The left ventricular internal cavity size was normal in size. There is moderate left ventricular hypertrophy. Left ventricular diastolic parameters are indeterminate. Right Ventricle: The right ventricular size is normal. No increase in right ventricular wall thickness. Right ventricular systolic function is normal. There is mildly elevated pulmonary artery systolic pressure. The tricuspid regurgitant velocity is 3.00  m/s, and with an assumed right atrial pressure of 8 mmHg, the estimated right ventricular systolic pressure is 57.8 mmHg. Left Atrium: Left atrial size was severely dilated. Right Atrium: Right atrial size was normal in size. Pericardium: There is no evidence of pericardial effusion. Mitral Valve: MitraClip in place, well seated. The mitral valve is normal in structure. Mild mitral valve regurgitation. MV peak gradient, 13.4 mmHg. The mean mitral valve gradient is 5.0 mmHg. Tricuspid Valve: The tricuspid valve is normal in structure. Tricuspid valve regurgitation is  moderate to severe. No evidence of tricuspid stenosis. Aortic Valve: The aortic valve is normal in structure. Aortic valve regurgitation is not visualized. No aortic stenosis is present. Pulmonic Valve: The pulmonic valve was normal in structure. Pulmonic valve regurgitation is not visualized. No evidence of pulmonic stenosis. Aorta: The aortic root is normal in size and structure. Venous: The inferior vena cava is normal in size with greater than 50% respiratory variability, suggesting right atrial pressure of 3 mmHg. IAS/Shunts: No atrial level shunt detected by color flow Doppler.  LEFT VENTRICLE PLAX 2D LVIDd:         5.10 cm  Diastology LVIDs:         3.90 cm  LV e' medial:    4.62 cm/s LV PW:         1.50 cm  LV E/e' medial:  32.0 LV IVS:        1.60 cm  LV e' lateral:  3.97 cm/s LVOT diam:     2.00 cm  LV E/e' lateral: 37.3 LV SV:         60 LV SV Index:   34 LVOT Area:     3.14 cm  RIGHT VENTRICLE RV Basal diam:  3.60 cm RV S prime:     7.14 cm/s TAPSE (M-mode): 2.7 cm LEFT ATRIUM              Index       RIGHT ATRIUM           Index LA diam:        4.50 cm  2.53 cm/m  RA Area:     14.20 cm LA Vol (A2C):   97.5 ml  54.89 ml/m RA Volume:   34.00 ml  19.14 ml/m LA Vol (A4C):   119.0 ml 66.99 ml/m LA Biplane Vol: 109.0 ml 61.36 ml/m  AORTIC VALVE LVOT Vmax:   94.20 cm/s LVOT Vmean:  66.300 cm/s LVOT VTI:    0.190 m  AORTA Ao Root diam: 2.30 cm MITRAL VALVE                TRICUSPID VALVE MV Area (PHT): 2.69 cm     TR Peak grad:   36.0 mmHg MV Peak grad:  13.4 mmHg    TR Vmax:        300.00 cm/s MV Mean grad:  5.0 mmHg MV Vmax:       1.83 m/s     SHUNTS MV Vmean:      95.8 cm/s    Systemic VTI:  0.19 m MV Decel Time: 282 msec     Systemic Diam: 2.00 cm MV E velocity: 148.00 cm/s MV A velocity: 66.20 cm/s MV E/A ratio:  2.24 Candee Furbish MD Electronically signed by Candee Furbish MD Signature Date/Time: 06/11/2020/3:55:34 PM    Final    CUP PACEART REMOTE DEVICE CHECK  Result Date: 06/04/2020 ILR summary  report received. Battery status OK. Normal device function. No new symptom, tachy, brady, or pause episodes. No new AF episodes. Monthly summary reports and ROV/PRN Kathy Breach, RN, CCDS, [employer]   Microbiology: No results found for this or any previous visit (from the past 240 hour(s)).   Labs: Basic Metabolic Panel: Recent Labs  Lab 06/12/20 0054 06/13/20 0205 06/14/20 0129 06/15/20 0123 06/16/20 0846  NA 145 142 141 144 142  K 3.9 3.9 3.9 4.0 3.8  CL 118* 114* 114* 115* 110  CO2 16* 19* 20* 21* 22  GLUCOSE 101* 108* 94 97 113*  BUN 31* 30* 32* 30* 26*  CREATININE 2.91* 2.78* 2.81* 2.67* 2.50*  CALCIUM 8.9 8.7* 8.6* 8.8* 8.9   Liver Function Tests: Recent Labs  Lab 06/10/20 0231 06/11/20 0140  AST 115* 86*  ALT 147* 130*  ALKPHOS 47 57  BILITOT 0.6 0.5  PROT 5.5* 5.6*  ALBUMIN 2.2* 2.3*   No results for input(s): LIPASE, AMYLASE in the last 168 hours. No results for input(s): AMMONIA in the last 168 hours. CBC: Recent Labs  Lab 06/10/20 0231 06/11/20 0140 06/12/20 0054 06/14/20 0129  WBC 4.4 5.4 6.7 6.4  HGB 10.9* 10.7* 10.6* 9.8*  HCT 34.0* 33.5* 33.0* 30.3*  MCV 98.8 98.0 98.5 99.0  PLT 234 221 229 224   Cardiac Enzymes: Recent Labs  Lab 06/12/20 0054 06/13/20 0205 06/14/20 0129 06/15/20 0123 06/16/20 0303  CKTOTAL 1,241* 569* 342* 216 144   BNP: BNP (last 3 results) No results for  input(s): BNP in the last 8760 hours.  ProBNP (last 3 results) No results for input(s): PROBNP in the last 8760 hours.  CBG: No results for input(s): GLUCAP in the last 168 hours.     Signed:  Domenic Polite MD.  [facility] 06/16/2020, 10:44 AM

## 2020-06-17 ENCOUNTER — Other Ambulatory Visit: Payer: Self-pay | Admitting: *Deleted

## 2020-06-17 LAB — CK: Total CK: 150 U/L (ref 38–234)

## 2020-06-17 MED FILL — VYNDAMAX 61 MG CAPS: 61 | 30 days supply | Qty: 30 | Fill #10

## 2020-06-17 NOTE — Progress Notes (Signed)
Called [facility] to give report spoke with Lonia Farber RN.

## 2020-06-17 NOTE — Care Management Important Message (Signed)
Important Message  Patient Details  Name: Katie [name] MRN: [id number] Date of Birth: September 12, 1945   Medicare Important Message Given:  Yes     Kaian Fahs P [place] 06/17/2020, 2:04 PM

## 2020-06-17 NOTE — Patient Outreach (Signed)
[facility]) Care Management  06/17/2020  Katie [name] 11/07/1945 [id number]   RN Health Coach Case Closure  Referral Date: 02/11/2019 Referral Source: Transfer from [facility] Reason for Referral: Continued Disease Management Education Insurance:[employer] Medicare   Outreach Attempt:  Received notification from [facility] Liaison patient to be discharged to skilled nursing facility and [facility] unable to follow there.  Plan:  RN Health Coach will close Disease Management case and make inactive with [facility] per [facility] Liaisons recommendations.  RN Health Coach will send primary care provider Case Closure letter.  RN Health Coach will send patient Case Closure letter.   [place] [phone] [email or web address]

## 2020-06-17 NOTE — TOC Transition Note (Addendum)
Transition of Care (TOC) - CM/SW Discharge Note *Discharged to [facility]  *Room 1008P *Number for Report: [phone]  Patient Details  Name: Katie [name] MRN: [id number] Date of Birth: Dec 05, 1945  Transition of Care [facility]) CM/SW Contact:  Sable Feil, LCSW Phone Number: 06/17/2020, 3:08 PM   Clinical Narrative:  Patient medically stable for discharge and going to [facility] today as insurance auth received from [employer]: Approved eff. 06/17/20 for 3 days - Approval number [id number]; Next review date: 11/18. Case manager for continued stay is [employer].    Discharge summary transmitted to [facility] and nurse provided with information to call report. Daughter, Mabry Santarelli [phone]) contacted and informed of today's discharge. Patient will be transported by non-emergency ambulance transport.    Final next level of care: [facility]) Barriers to Discharge: Barriers Resolved   Patient Goals and CMS Choice Patient states their goals for this hospitalization and ongoing recovery are:: Patient and daughter agreeable to ST rehab before patient returns home CMS Medicare.gov Compare Post Acute Care list provided to:: Other (Comment Required) ([facility] had already been chosen) Choice offered to / list presented to : NA  Discharge Placement   Existing PASRR number confirmed : 06/08/20          Patient chooses bed at: [facility] Patient to be transferred to facility by: Non-emergency ambulance transport Name of family member notified: Daughter Kyliana Standen - [phone] Patient and family notified of of transfer: 06/17/20  Discharge Plan and Services     Post Acute Care Choice: [facility]                              Social Determinants of Health (SDOH) Interventions  No SDOH interventions needed at discharge   Readmission Risk Interventions No flowsheet data found.

## 2020-06-17 NOTE — Progress Notes (Signed)
Called [facility] to give report going to room 1008P  No answer 2nd time  Left message.

## 2020-06-17 NOTE — Progress Notes (Signed)
Patient seen and examined, no changes from my discharge summary yesterday, awaiting SNF today  Domenic Polite, MD

## 2020-06-17 NOTE — Progress Notes (Signed)
Called [facility] left message to give report.

## 2020-06-17 NOTE — Progress Notes (Signed)
DISCHARGE NOTE SNF [name] to be discharged Skilled nursing facility per MD order. Patient verbalized understanding.  Skin clean, dry and intact without evidence of skin break down, no evidence of skin tears noted. IV catheter discontinued intact. Site without signs and symptoms of complications. Dressing and pressure applied. Pt denies pain at the site currently. No complaints noted.  Patient free of lines, drains, and wounds.   Discharge packet assembled. An After Visit Summary (AVS) was printed and given to the EMS personnel. Patient escorted via stretcher and discharged to [facility] via ambulance. Report called to accepting facility; all questions and concerns addressed.   Berneta Levins, RN

## 2020-07-01 ENCOUNTER — Telehealth: Payer: Self-pay | Admitting: Emergency Medicine

## 2020-07-01 NOTE — Telephone Encounter (Signed)
LMOM to call DC , # and office hours provided. LINQ at [facility].

## 2020-07-02 ENCOUNTER — Ambulatory Visit: Payer: Medicare Other | Admitting: *Deleted

## 2020-07-07 ENCOUNTER — Other Ambulatory Visit: Payer: Self-pay

## 2020-07-07 ENCOUNTER — Ambulatory Visit (HOSPITAL_COMMUNITY)
Admission: RE | Admit: 2020-07-07 | Discharge: 2020-07-07 | Disposition: A | Discharge status: Home / Self Care | Payer: Medicare Other | Source: Ambulatory Visit | Attending: Cardiology | Admitting: Cardiology

## 2020-07-07 ENCOUNTER — Encounter (HOSPITAL_COMMUNITY): Payer: Self-pay

## 2020-07-07 VITALS — BP 133/82 | HR 84 | Wt 151.0 lb

## 2020-07-07 DIAGNOSIS — Z79899 Other long term (current) drug therapy: Secondary | ICD-10-CM | POA: Insufficient documentation

## 2020-07-07 DIAGNOSIS — E785 Hyperlipidemia, unspecified: Secondary | ICD-10-CM | POA: Diagnosis not present

## 2020-07-07 DIAGNOSIS — N1831 Chronic kidney disease, stage 3a: Secondary | ICD-10-CM | POA: Diagnosis not present

## 2020-07-07 DIAGNOSIS — I5032 Chronic diastolic (congestive) heart failure: Secondary | ICD-10-CM | POA: Insufficient documentation

## 2020-07-07 DIAGNOSIS — I130 Hypertensive heart and chronic kidney disease with heart failure and stage 1 through stage 4 chronic kidney disease, or unspecified chronic kidney disease: Secondary | ICD-10-CM | POA: Diagnosis not present

## 2020-07-07 DIAGNOSIS — Z7982 Long term (current) use of aspirin: Secondary | ICD-10-CM | POA: Diagnosis not present

## 2020-07-07 DIAGNOSIS — Z8673 Personal history of transient ischemic attack (TIA), and cerebral infarction without residual deficits: Secondary | ICD-10-CM | POA: Insufficient documentation

## 2020-07-07 DIAGNOSIS — I340 Nonrheumatic mitral (valve) insufficiency: Secondary | ICD-10-CM | POA: Diagnosis not present

## 2020-07-07 DIAGNOSIS — Z7902 Long term (current) use of antithrombotics/antiplatelets: Secondary | ICD-10-CM | POA: Diagnosis not present

## 2020-07-07 DIAGNOSIS — M6282 Rhabdomyolysis: Secondary | ICD-10-CM | POA: Diagnosis not present

## 2020-07-07 LAB — LIPID PANEL
Cholesterol: 206 mg/dL — ABNORMAL HIGH (ref 0–200)
HDL: 62 mg/dL (ref >40)
LDL Cholesterol: 127 mg/dL — ABNORMAL HIGH (ref 0–99)
Total CHOL/HDL Ratio: 3.3 RATIO
Triglycerides: 85 mg/dL (ref <150)
VLDL: 17 mg/dL (ref 0–40)

## 2020-07-07 LAB — BASIC METABOLIC PANEL
Anion gap: 15 (ref 5–15)
BUN: 26 mg/dL — ABNORMAL HIGH (ref 8–23)
CO2: 15 mmol/L — ABNORMAL LOW (ref 22–32)
Calcium: 9.5 mg/dL (ref 8.9–10.3)
Chloride: 111 mmol/L (ref 98–111)
Creatinine, Ser: 2 mg/dL — ABNORMAL HIGH (ref 0.44–1.00)
GFR, Estimated: 26 mL/min — ABNORMAL LOW (ref >60)
Glucose, Bld: 75 mg/dL (ref 70–99)
Potassium: 4.6 mmol/L (ref 3.5–5.1)
Sodium: 141 mmol/L (ref 135–145)

## 2020-07-07 NOTE — Progress Notes (Signed)
ReDS Vest / Clip - 07/07/20 0900      ReDS Vest / Clip   Station Marker A    Ruler Value 28    ReDS Value Range Moderate volume overload    ReDS Actual Value 38    Anatomical Comments sitting

## 2020-07-07 NOTE — Progress Notes (Signed)
PCP: Dr. [name] HF Cardiology: Dr. [name]  74 y.o. with history of chronic diastolic CHF, mitral regurgitation, CVA, and CKD returns for followup of CHF.   Patient was initially admitted with diastolic CHF in 2/37. Echo showed EF 60-65% with grade 3 diastolic dysfunction and moderate MR.  She had cath showing no coronary disease. 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  TEE this admission showed moderate-severe MR.  She was admitted again in 10/18 with acute on chronic diastolic CHF. Echo this admission showed EF 50-55%, severe LVH, restrictive diastolic dysfunction, severe MR, moderate-severe TR.  She was referred to Dr. [name] who saw her recently for mitral valve evaluation.  Upon review of studies, regurgitation thought to be more in the moderate range and likely functional.   Patient had PYP amyloid scan in 12/18, this was suggestive of transthyretin amyloidosis.  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   Admission for A/C diastolic HF 6/2-8/31/5176. Required IV diuresis, then transitioned to 80 mg PO lasix BID. Metop was DC'd due to low blood pressures. Cardiac MRI strongly suggestive of amyloidosis, moderate to severe MR (moderate by regurgitant fraction 41% but severe visually). Echo repeated: EF 40-45%, moderate to severe LVH, severe MR, severe dilation of LA and RA, moderate TR, PASP 49.  She had Mitraclip x 2 placed in 5/19.  Repeat echo post-mitraclip showed EF 55-60%, moderate LVH, mean gradient 3 mmHg across the MV with trace-mild MR.   Cardiomems was placed 6/19.    Had Zio Patch placed 01/2020. Had 3 runs NSVT, 5.1% PVCs.   Last seen in [facility] 8/21 and was stable from CHF standpoint w/ Cardiomems reading at goal.   Since last visit, she was admitted to the hospital, 11/21, for rhabdomyolysis and AKI, felt secondary to  Drug-drug interactions in the setting of tafamidis and rosuvastatin (literature review noted several case  reports). Crestor discontinued, diuretics held and she was hydrated w/ IVFs. Tafamidis was also held temporarily but ultimately resumed. SCr had improved down from peak of 3.8>>2.5 day of d/c. CK had normalized, down from 29,000>>150 by discharge. Echo was also performed and showed normal LVEF 55-60%. Moderate LVH. Normal RV. MitraClip in place and well seated w/ only trivial MR. She was discharged to SNF for rehab but has since returned home.   She presents to clinic today for post hospital f/u. Here w/ her daughter. Notes increased SOB w/ activity. She is ordered to only take 20 mg of Lasix every MWF. BP well controlled 133/82. EKG shows NSR. No PVCs. Her last Cardiomems reading was 3 days ago on 12/3, dPAP pressure was elevated at  25 (goal 19). She did not submit reading today. She is currently w/o a pillow. Had to turn in and is awaiting an upgrade. ReDs Clip elevated today at 38%.   Labs (10/18): K 4.4, creatinine 1.27, LDL 56, HDL 39 Labs (11/18): K 3.9, creatinine 1.43, SPEP negative Labs (2/19): K 3.8, creatinine 1.40, hgb 13.4 Labs (5/19): K 4.1, creatinine 1.4, hgb 10.8 Labs (6/19): K 4.2, creatinine 1.4, hgb 12.4 Labs (7/19): K 4, creatinine 1.38 Labs (1/20): K 3.6, creatinine 1.36 Labs (08/2019): K 3.8 Creatinine 2.17  Labs (11/21): SCr 3.84>>2.5 CK 29,000>>150 (admit for rhabdomyolysis)  PMH: 1. HTN 2. Hyperlipidemia 3. 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  LINQ monitor placed.  4. Chronic diastolic CHF: She has Cardiomems.  - LHC (6/17): Normal coronaries.  - Echo (10/18): EF 50-55%, severe LVH  with speckled myocardium concerning for amyloidosis, restrictive diastolic function with septal flattening, severe MR, moderate-severe TR, severe biatrial enlargement, PASP 54 mmHg.  - PYP amyloid scan: Grade 3, suggestive of transthyretin amyloidosis.  - Echo (09/2017): EF 40-45%, moderate to severe LVH, severe MR, severe dilation of LA and RA, moderate TR, PASP 49 - Cardiac MRI (02/19):  Moderate LVH, EF 45% with diffuse hypokinesis, mildly dilated RV with mildly decreased systolic function, mod to severe MR (moderate by 41% regurgitant fraction, severe visually), severe biatrial enlargement, LGE pattern strongly suggestive of cardiac amyloidosis - Genetic testing showed genetic TTR amyloidosis from Val142Ile.   - Echo (5/19): EF 55-60%, moderate LVH c/w amyloidosis, Mitraclip x 2 with mean MV gradient 3 mmHg, trace-mild MR, moderate TR, PASP 62 mmHg.  - Echo (6/19): EF 55-60%, moderate LVH with speckled myocardium, trivial MR s/p Mitraclip with mean gradient 2 mmHg, PASP 61.  - Cardiomems placed 2019  5. Mitral regurgitation: Think mixed functional/degenerative - TEE (7/18): EF 45-50%, moderate-severe MR, mild-moderate TR.  - Echo (10/18) read as severe MR.  - 2/19 echo with severe MR.  - 2/19 cardiac MRI with moderate to severe MR.  - Mitraclip x 2 in 5/19.   - Echo 5/19 post-Mitraclip with mean MV gradient 3 mmHg, trace-mild MR.  6. CKD stage 3 7. Hypothyroidism 8. H/o renal cell CA s/p nephrectomy.  9. Bilateral carpal tunnel releases.   Social History   Socioeconomic History  . Marital status: Divorced    Spouse name: Not on file  . Number of children: 5  . Years of education: 67  . Highest education level: Not on file  Occupational History  . Occupation: retired    Fish farm manager: [employer]  Tobacco Use  . Smoking status: Never Smoker  . Smokeless tobacco: Never Used  Vaping Use  . Vaping Use: Never used  Substance and Sexual Activity  . Alcohol use: No  . Drug use: No  . Sexual activity: Not Currently  Other Topics Concern  . Not on file  Social History Narrative   Lives alone in a one story home.  Her son stays with her some.  Has 5 children, 10 grandchildren and 10 great grandchildren.  Retired Training and development officer at [employer].  Education: high school.        07/14/18- list food insecurity but is aware of food pantries and gets $15 in food stamps, reports  issues with medical bills but has insurance and gets extra help through medicaid for premiums- encouraged pt to call billing to set up reasonable payment plan so it doesn't go to collections.   Social Determinants of Health   Financial Resource Strain: Low Risk   . Difficulty of Paying Living Expenses: Not hard at all  Food Insecurity: No Food Insecurity  . Worried About Charity fundraiser in the Last Year: Never true  . Ran Out of Food in the Last Year: Never true  Transportation Needs: No Transportation Needs  . Lack of Transportation (Medical): No  . Lack of Transportation (Non-Medical): No  Physical Activity:   . Days of Exercise per Week: Not on file  . Minutes of Exercise per Session: Not on file  Stress:   . Feeling of Stress : Not on file  Social Connections:   . Frequency of Communication with Friends and Family: Not on file  . Frequency of Social Gatherings with Friends and Family: Not on file  . Attends Religious Services: Not on file  . Active Member  of Clubs or Organizations: Not on file  . Attends Archivist Meetings: Not on file  . Marital Status: Not on file  Intimate Partner Violence:   . Fear of Current or Ex-Partner: Not on file  . Emotionally Abused: Not on file  . Physically Abused: Not on file  . Sexually Abused: Not on file   Family History  Problem Relation Age of Onset  . Stroke Brother   . Diabetes Mellitus II Brother   . Diabetes Mellitus II Sister   . Breast cancer Sister   . Colon cancer Sister    ROS: All systems reviewed and negative except as per HPI.   Current Outpatient Medications  Medication Sig Dispense Refill  . acetaminophen (TYLENOL) 500 MG tablet Take 1,000 mg by mouth every 6 (six) hours as needed for headache (pain).    Marland Kitchen amoxicillin (AMOXIL) 500 MG tablet Take 4 tablets (2,000 mg) 1 hour prior to dental procedures. 8 tablet 6  . aspirin 81 MG tablet Take 1 tablet (81 mg total) daily by mouth.    .  butalbital-acetaminophen-caffeine (FIORICET, ESGIC) 50-325-40 MG tablet Take 1 tablet by mouth every 12 (twelve) hours as needed for headache. 14 tablet 0  . carvedilol (COREG) 3.125 MG tablet Take 1 tablet (3.125 mg total) by mouth 2 (two) times daily with a meal. 90 tablet 3  . clopidogrel (PLAVIX) 75 MG tablet Take 1 tablet (75 mg total) by mouth daily. 30 tablet 1  . feeding supplement, ENSURE ENLIVE, (ENSURE ENLIVE) LIQD Take 237 mLs by mouth 2 (two) times daily between meals. 237 mL 0  . furosemide (LASIX) 20 MG tablet Take 1 tablet (20 mg total) by mouth every Monday, Wednesday, and Friday.    . gabapentin (NEURONTIN) 300 MG capsule Take 1 capsule (300 mg total) by mouth daily.    . Hypromellose (ARTIFICIAL TEARS OP) Place 1 drop into both eyes daily as needed (dry eyes).    Marland Kitchen levothyroxine (SYNTHROID, LEVOTHROID) 25 MCG tablet Take 25 mcg by mouth daily before breakfast.    . potassium chloride SA (KLOR-CON) 20 MEQ tablet Take 1 tablet (20 mEq total) by mouth daily. 60 tablet 0  . Tafamidis (VYNDAMAX) 61 MG CAPS Take 61 mg by mouth daily. 30 capsule 11   No current facility-administered medications for this encounter.   BP 133/82   Pulse 84   Wt 68.5 kg   LMP 02/12/2013   SpO2 99%   BMI 26.75 kg/m   Vitals:   07/07/20 0909  BP: 133/82  Pulse: 84  SpO2: 99%    Wt Readings from Last 3 Encounters:  07/07/20 68.5 kg  06/17/20 75.1 kg  03/24/20 64.5 kg   PHYSICAL EXAM: ReDs clip 38% General:  Well appearing. No respiratory difficulty HEENT: normal Neck: supple. JVD 9 cm. Carotids 2+ bilat; no bruits. No lymphadenopathy or thyromegaly appreciated. Cor: PMI nondisplaced. Regular rate & rhythm. No rubs, gallops or murmurs. Lungs: clear Abdomen: soft, nontender, nondistended. No hepatosplenomegaly. No bruits or masses. Good bowel sounds. Extremities: no cyanosis, clubbing, rash, edema Neuro: alert & oriented x 3, cranial nerves grossly intact. moves all 4 extremities w/o  difficulty. Affect pleasant.   EKG: NSR 78 bpm. No PVCs   Assessment/Plan: 1. Chronic diastolic CHF: Normal EF on 10/18 echo with severe LVH and speckled myocardium consistent with cardiac amyloidosis.  SPEP was negative. 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 Cardiac MRI also strongly  suggestive of cardiac amyloidosis.  Cardiomems placed 2019. Recent echo 11/21 EF 55-60%, mod LVH, nl RV.  - she endorses increased dyspnea, NYHA Class III - Cardiomems Goal 19. Most recent reading 12/3 elevated at 25 ( no reading today currently awaiting new pillow upgrade). ReDs clip elevated at 38%.  - Increase lasix to 20 mg daily through this week for the next 5 days M-F (currenly taking MWF). She will return to MWF dosing next Monday.  - Check BMP today and again in 7 days. Plan RN visit at f/u lab appt to repeat ReDs clip. May need to continue to take daily if SCr remains stable.  - Continue carvedilol 3.125 mg BID.  - Continue Tafamidis  2. Mitral regurgitation: Mitral regurgitation seems to fluctuate somewhat based on loading conditions.  Most recently, echo in 2/19 suggested severe MR and cardiac MRI suggested moderate to severe MR (moderate by 41% regurgitant fraction but visually severe). I suspect that the etiology is mixed - functional as well as degenerative with thickening and restriction of posterior leaflet. She is now s/p Mitraclip procedure in 5/19. Post-clip echo in 6/19 showed mean gradient 2 mmHg across the MV with trace MR. Recent echo 11/21 showed MitraClip well seated w/ only mild MR 3. CKD: Stage IIIa - recent admit for rhabdomyolysis as noted above  - check BMP today  -She is followed by nephrology.  4. CVA: 7/18. Has LINQ monitor, no atrial fibrillation detected so far.  - Continue ASA 81.   - Continue Plavix. - off statin due to interaction w/ tafamidis. Check lipid panel today   5. 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  6. Cardiac amyloidosis: Hereditary TTR amyloidosis.  She has peripheral neuropathy as well as bilateral carpal tunnel syndrome.   - She is now on tafamidis. Unfourtunatley developed rhabdomyolysis w/ Crestor (literature review noted several case reports). Crestor has been discontinued  7. PVCs Zio Patch placed 01/2020- NSVT + 5.1% PVCs. Carvedilol was restarted 3.125 mg twice a day. Continue.  - EKG today shows NSR. No PVCS  F/u w/ RN visit in 1 week to repeat ReDs clip (awaiting cardiomems pillow replacement). F/u in 4 weeks w/ APP to reassess volume status.    Brynley Cuddeback PA-C  07/07/2020 9:10 AM

## 2020-07-07 NOTE — Patient Instructions (Addendum)
Take Furosemide 20mg  (1 tablet) Daily Monday (12/6)-Friday (12/10). Then resume taking M-W-F  Labs done today, your results will be available in MyChart, we will contact you for abnormal readings.  Your physician recommends that you schedule repeat labs in 7-10 days  Your physician recommends that you schedule a follow-up appointment in: 1 month with APP clinic  If you have any questions or concerns before your next appointment please send [place] a message through [place] or call our office at [phone].    TO LEAVE A MESSAGE FOR THE NURSE SELECT OPTION 2, PLEASE LEAVE A MESSAGE INCLUDING: . YOUR NAME . DATE OF BIRTH . CALL BACK NUMBER . REASON FOR CALL**this is important as we prioritize the call backs  YOU WILL RECEIVE A CALL BACK THE SAME DAY AS LONG AS YOU CALL BEFORE 4:00 PM

## 2020-07-09 NOTE — Telephone Encounter (Signed)
I cancelled all upcoming remotes, marked her inactive in paceart and took her out of [facility].

## 2020-07-10 NOTE — Telephone Encounter (Signed)
Called patient to discuss options to explant or leave in. Patient would like to leave device in. Medtronic return kit will be sent to address on file.

## 2020-07-11 ENCOUNTER — Telehealth (HOSPITAL_COMMUNITY): Payer: Self-pay | Admitting: Pharmacist

## 2020-07-11 NOTE — Telephone Encounter (Signed)
Patient Advocate Encounter   Received notification from [employer] that prior authorization for Vyndamax is required.   PA submitted on CoverMyMeds Key [id number] Status is pending   Will continue to follow.   Audry Riles, PharmD, BCPS, BCCP, CPP Heart Failure Clinic Pharmacist [phone]

## 2020-07-11 NOTE — Telephone Encounter (Signed)
Advanced Heart Failure Patient Advocate Encounter  Prior Authorization for Luiz Iron has been approved.    PA# [id number] Effective dates: 07/11/20 through 08/01/2021  Audry Riles, PharmD, BCPS, BCCP, CPP Heart Failure Clinic Pharmacist [phone]

## 2020-07-14 MED FILL — VYNDAMAX 61 MG CAPS: 61 | 30 days supply | Qty: 30 | Fill #11

## 2020-07-16 ENCOUNTER — Other Ambulatory Visit: Payer: Self-pay

## 2020-07-16 ENCOUNTER — Ambulatory Visit (HOSPITAL_COMMUNITY)
Admission: RE | Admit: 2020-07-16 | Discharge: 2020-07-16 | Disposition: A | Discharge status: Home / Self Care | Payer: Medicare Other | Source: Ambulatory Visit | Attending: Internal Medicine | Admitting: Internal Medicine

## 2020-07-16 DIAGNOSIS — I5032 Chronic diastolic (congestive) heart failure: Secondary | ICD-10-CM | POA: Diagnosis present

## 2020-07-16 LAB — BASIC METABOLIC PANEL
Anion gap: 8 (ref 5–15)
BUN: 28 mg/dL — ABNORMAL HIGH (ref 8–23)
CO2: 23 mmol/L (ref 22–32)
Calcium: 9.9 mg/dL (ref 8.9–10.3)
Chloride: 109 mmol/L (ref 98–111)
Creatinine, Ser: 1.84 mg/dL — ABNORMAL HIGH (ref 0.44–1.00)
GFR, Estimated: 28 mL/min — ABNORMAL LOW (ref >60)
Glucose, Bld: 89 mg/dL (ref 70–99)
Potassium: 4.5 mmol/L (ref 3.5–5.1)
Sodium: 140 mmol/L (ref 135–145)

## 2020-07-16 LAB — BRAIN NATRIURETIC PEPTIDE: B Natriuretic Peptide: 488.6 pg/mL — ABNORMAL HIGH (ref 0.0–100.0)

## 2020-07-17 ENCOUNTER — Telehealth (HOSPITAL_COMMUNITY): Payer: Self-pay | Admitting: Adult Health

## 2020-07-17 MED ORDER — FUROSEMIDE 20 MG PO TABS
20.0000 mg | ORAL_TABLET | Freq: Every day | ORAL | 3 refills | Status: DC
Start: 2020-07-17 — End: 2020-09-30

## 2020-07-17 NOTE — Telephone Encounter (Signed)
Please call.   Cardiomems remains elevated. Instruct to take 20 mg lasix daily.   Ronold Hardgrove Np_C 1:50 PM

## 2020-07-17 NOTE — Telephone Encounter (Signed)
Patient advised and verbalized understanding. Med list updated

## 2020-07-17 NOTE — Addendum Note (Signed)
Addended by: Malena Edman on: 07/17/2020 01:55 PM   Modules accepted: Orders

## 2020-07-23 NOTE — Progress Notes (Signed)
Follow-up Visit   Date: 07/23/20    Katie [name] MRN: [id number] DOB: March 13, 1946   Interim History: Katie [name] is a 74 y.o. 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  The patient was accompanied to the clinic by self.  History of present illness: She has history of heart failure since 2017 and underwent PYP amyloid scan in December suggestive of transthyretin amyloidosis.  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  She is being evaluated for patisiran treatment.  From a neurological standpoint, patient has had history of bilateral carpal tunnel release and continues to have hand tingling.  She has some weakness related to arthritis, but denies dropping objects. Over the past year, she has intermittent tingling of both feet.  She denies imbalance and has not suffered any falls.  She walks unassisted.  She manages her own household duties such as cooking, cleaning, and driving. Her son who is an amputee lives with her.  There is no family history of neuropathy.  Of note, she had a left cerebellar stroke in July 2018 secondary to embolic basilar occlusion and absent flow in bilateral SCA and PCAs.  She was treated with high-dose statin and dual antiplatelet therapy. Subsequent MRA from March 2019 shows no residual stenosis.  UPDATE 07/23/2020:  She was hospitalized with AKI and rhadomylosis in November.  CK trending down with hydration and her statin was discontinued.  She was discharged to rehab and has been home for the past two weeks getting home PT.  She is walking with a cane and complains of bilateral knee and ankle pain.  She denies muscle pain.  Her  neuropathy is unchanged.  She still has numbness and tingling in the hands and occasionally in the feet.     Medications:  Current Outpatient Medications on File Prior to Visit  Medication Sig Dispense Refill  . acetaminophen (TYLENOL) 500 MG tablet Take 1,000 mg by mouth every 6 (six) hours as needed for headache (pain).    Marland Kitchen amoxicillin (AMOXIL) 500 MG tablet Take 4 tablets (2,000 mg) 1 hour prior to dental procedures. 8 tablet 6  . aspirin 81 MG tablet Take 1 tablet (81 mg total) daily by mouth.    . butalbital-acetaminophen-caffeine (FIORICET, ESGIC) 50-325-40 MG tablet Take 1 tablet by mouth every 12 (twelve) hours as needed for headache. 14 tablet 0  . carvedilol (COREG) 3.125 MG tablet Take 1 tablet (3.125 mg total) by mouth 2 (two) times daily with a meal. 90 tablet 3  . clopidogrel (PLAVIX) 75 MG tablet Take 1 tablet (75 mg total) by mouth daily. 30 tablet 1  . feeding supplement, ENSURE ENLIVE, (ENSURE ENLIVE) LIQD Take 237 mLs by mouth 2 (two) times daily between meals. 237 mL 0  . furosemide (LASIX) 20 MG tablet Take 1 tablet (20 mg total) by mouth daily. 30 tablet 3  . gabapentin (NEURONTIN) 300 MG capsule Take 1 capsule (300 mg total) by mouth daily.    . Hypromellose (ARTIFICIAL TEARS OP) Place 1 drop into both eyes daily as needed (dry eyes).    Marland Kitchen levothyroxine (SYNTHROID, LEVOTHROID) 25 MCG tablet Take 25 mcg by mouth daily before breakfast.    . potassium chloride SA (KLOR-CON) 20 MEQ tablet Take 1 tablet (20  mEq total) by mouth daily. 60 tablet 0  . Tafamidis (VYNDAMAX) 61 MG CAPS Take 61 mg by mouth daily. 30 capsule 11   No current facility-administered medications on file prior to visit.    Allergies:  Allergies  Allergen Reactions  . Crestor [Rosuvastatin] Other (See Comments)    Drug interaction w/ tafamidis, caused rhabdomyolysis  . Tramadol Shortness Of Breath and Palpitations    Vital Signs:  LMP 02/12/2013    Neurological Exam: MENTAL STATUS including  orientation to time, place, person, recent and remote memory, attention span and concentration, language, and fund of knowledge is normal.  Speech is not dysarthric.  CRANIAL NERVES: Pupils equal round and reactive to light.  Normal conjugate, extra-ocular eye movements in all directions of gaze.  No ptosis. Face is symmetric.   MOTOR:  Motor strength is 5/5 in all extremities, except 5-/5 bilateral interosseus muscles. Hip flexion motor strength is 5/5, but ROM is reduced. No atrophy, fasciculations or abnormal movements.  No pronator drift.  Tone is normal.    MSRs:  Reflexes are 2+/4 throughout, except 1+/4 bilateral Achilles reflex.  SENSORY: Vibration is reduced at the great toe bilaterally.  Sensation is intact at the MCP bilaterally.  COORDINATION/GAIT:    Gait appears slow, assisted with a cane, somewhat unsteady.   Data: MRA head 10/14/2017:  Negative intracranial MRA. No residual or recurrent distal basilar Stenosis.  Cardiac SPECT 07/06/2017:  Visual and quantitative assessment (grade 3, H/CLL equal 1.94) are strongly suggestive of transthyretin amyloidosis.  MRI/A head 04/22/2017:    Chronic ischemic changes as above. No acute infarct.  No significant intracranial stenosis on MRA. Previously noted embolic occlusion distal basilar extending into the posterior cerebral artery bilaterally has resolved.  NCS/EMG of the right side 12/15/2017: The electrophysiologic findings are most consistent with a sensorimotor polyneuropathy, demyelinating with secondary axon loss, affecting the right upper extremity; moderate in degree electrically.    Alternatively, right carpal tunnel and cubital tunnel syndrome cannot be excluded. Correlate clinically.  IMPRESSION/PLAN: 1.  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  She has bilateral CTS release and nonlength dependent neuropathy. Tafamidis started in 2019.  She does not wish to start patisiran.  Clinically, her  neuropathy is stable.  - Patient educated on daily foot inspection, fall prevention, and safety precautions around the home.  2.  History of left cerebellar embolic stroke (6659) secondary to basilar occlusion, no residual symptoms.  - Continue aspiring 81mg  + plavic 75mg  daily  - Statin discontinued due to rhabdomyolysis  - LDL is elevated at 127.  We need PCP's and cardiology guidance decide alternative lipid lowering agents  3.  Rhabdomyolysis due to medication effect (statin + tafamidis).  Ck normalized, Cr is trending down, but still abnormal.  No evidence of weakness on exam.  - statin has been discontinued   - continue home PT  Return to clinic in 6 months  Total time spent reviewing records, interview, history/exam, documentation, counseling and coordination of care on day of encounter:  30 min     Thank you for allowing me to participate in patient's care.  If I can answer any additional questions, I would be pleased to do so.    Sincerely,    Shrey Boike K. Posey Pronto, DO

## 2020-07-24 ENCOUNTER — Encounter: Payer: Self-pay | Admitting: Neurology

## 2020-07-24 ENCOUNTER — Ambulatory Visit (INDEPENDENT_AMBULATORY_CARE_PROVIDER_SITE_OTHER): Payer: Medicare Other | Admitting: Neurology

## 2020-07-24 ENCOUNTER — Other Ambulatory Visit: Payer: Self-pay

## 2020-07-24 VITALS — BP 143/82 | HR 97 | Ht 63.0 in | Wt 151.0 lb

## 2020-07-24 DIAGNOSIS — D472 Monoclonal gammopathy: Secondary | ICD-10-CM

## 2020-07-24 DIAGNOSIS — I639 Cerebral infarction, unspecified: Secondary | ICD-10-CM | POA: Diagnosis not present

## 2020-07-24 DIAGNOSIS — G990 Autonomic neuropathy in diseases classified elsewhere: Secondary | ICD-10-CM | POA: Diagnosis not present

## 2020-07-24 DIAGNOSIS — E854 Organ-limited amyloidosis: Secondary | ICD-10-CM | POA: Diagnosis not present

## 2020-07-24 NOTE — Patient Instructions (Signed)
Return to clinic in 6 months.

## 2020-08-07 ENCOUNTER — Encounter (HOSPITAL_COMMUNITY): Payer: Medicare Other

## 2020-08-07 ENCOUNTER — Other Ambulatory Visit (HOSPITAL_COMMUNITY): Payer: Self-pay | Admitting: Cardiology

## 2020-08-13 DIAGNOSIS — Z7902 Long term (current) use of antithrombotics/antiplatelets: Secondary | ICD-10-CM | POA: Diagnosis not present

## 2020-08-13 DIAGNOSIS — N179 Acute kidney failure, unspecified: Secondary | ICD-10-CM | POA: Diagnosis not present

## 2020-08-13 DIAGNOSIS — N1832 Chronic kidney disease, stage 3b: Secondary | ICD-10-CM | POA: Diagnosis not present

## 2020-08-13 DIAGNOSIS — I272 Pulmonary hypertension, unspecified: Secondary | ICD-10-CM | POA: Diagnosis not present

## 2020-08-13 DIAGNOSIS — Z85528 Personal history of other malignant neoplasm of kidney: Secondary | ICD-10-CM | POA: Diagnosis not present

## 2020-08-13 DIAGNOSIS — G63 Polyneuropathy in diseases classified elsewhere: Secondary | ICD-10-CM | POA: Diagnosis not present

## 2020-08-13 DIAGNOSIS — I43 Cardiomyopathy in diseases classified elsewhere: Secondary | ICD-10-CM | POA: Diagnosis not present

## 2020-08-13 DIAGNOSIS — I130 Hypertensive heart and chronic kidney disease with heart failure and stage 1 through stage 4 chronic kidney disease, or unspecified chronic kidney disease: Secondary | ICD-10-CM | POA: Diagnosis not present

## 2020-08-13 DIAGNOSIS — I5040 Unspecified combined systolic (congestive) and diastolic (congestive) heart failure: Secondary | ICD-10-CM | POA: Diagnosis not present

## 2020-08-13 DIAGNOSIS — Z8673 Personal history of transient ischemic attack (TIA), and cerebral infarction without residual deficits: Secondary | ICD-10-CM | POA: Diagnosis not present

## 2020-08-13 DIAGNOSIS — E854 Organ-limited amyloidosis: Secondary | ICD-10-CM | POA: Diagnosis not present

## 2020-08-13 DIAGNOSIS — I051 Rheumatic mitral insufficiency: Secondary | ICD-10-CM | POA: Diagnosis not present

## 2020-08-13 DIAGNOSIS — Z7982 Long term (current) use of aspirin: Secondary | ICD-10-CM | POA: Diagnosis not present

## 2020-08-13 DIAGNOSIS — M6282 Rhabdomyolysis: Secondary | ICD-10-CM | POA: Diagnosis not present

## 2020-08-13 DIAGNOSIS — E039 Hypothyroidism, unspecified: Secondary | ICD-10-CM | POA: Diagnosis not present

## 2020-08-13 DIAGNOSIS — Z905 Acquired absence of kidney: Secondary | ICD-10-CM | POA: Diagnosis not present

## 2020-08-14 MED FILL — VYNDAMAX 61 MG CAPS: 61 | 30 days supply | Qty: 30 | Fill #0

## 2020-08-18 ENCOUNTER — Encounter (HOSPITAL_COMMUNITY): Payer: Medicare Other

## 2020-08-27 NOTE — Progress Notes (Signed)
PCP: Dr. [name] HF Cardiology: Dr. [name]  75 y.o. with history of chronic diastolic CHF, mitral regurgitation, CVA, and CKD returns for followup of CHF.   Patient was initially admitted with diastolic CHF in 3/23. Echo showed EF 60-65% with grade 3 diastolic dysfunction and moderate MR.  She had cath showing no coronary disease. 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  TEE this admission showed moderate-severe MR.  She was admitted again in 10/18 with acute on chronic diastolic CHF. Echo this admission showed EF 50-55%, severe LVH, restrictive diastolic dysfunction, severe MR, moderate-severe TR.  She was referred to Dr. [name] who saw her recently for mitral valve evaluation.  Upon review of studies, regurgitation thought to be more in the moderate range and likely functional.   Patient had PYP amyloid scan in 12/18, this was suggestive of transthyretin amyloidosis.  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   Admission for A/C diastolic HF 5/5-7/32/2025. Required IV diuresis, then transitioned to 80 mg PO lasix BID. Metop was DC'd due to low blood pressures. Cardiac MRI strongly suggestive of amyloidosis, moderate to severe MR (moderate by regurgitant fraction 41% but severe visually). Echo repeated: EF 40-45%, moderate to severe LVH, severe MR, severe dilation of LA and RA, moderate TR, PASP 49.  She had Mitraclip x 2 placed in 5/19.  Repeat echo post-mitraclip showed EF 55-60%, moderate LVH, mean gradient 3 mmHg across the MV with trace-mild MR.   Cardiomems was placed 6/19.    Had Zio Patch placed 01/2020. Had 3 runs NSVT, 5.1% PVCs.   Last seen in [facility] 8/21 and was stable from CHF standpoint w/ Cardiomems reading at goal.   Admitted to the hospital, 11/21, for rhabdomyolysis and AKI, felt secondary to  Drug-drug interactions in the setting of tafamidis and rosuvastatin (literature review noted several case reports). Crestor  discontinued, diuretics held and she was hydrated w/ IVFs. Tafamidis was also held temporarily but ultimately resumed. SCr had improved down from peak of 3.8>>2.5 day of d/c. CK had normalized, down from 29,000>>150 by discharge. Echo was also performed and showed normal LVEF 55-60%. Moderate LVH. Normal RV. MitraClip in place and well seated w/ only trivial MR. She was discharged to SNF for rehab but has since returned home.   Today she returns for HF follow up.Last visit lasix was started. Overall feeling ok. Having ongoing fatigue. Denies dizziness/syncope. Denies SOB/PND/Orthopnea. Appetite ok. No fever or chills. Weight at home 143  pounds. Taking all medications.   Labs (10/18): K 4.4, creatinine 1.27, LDL 56, HDL 39 Labs (11/18): K 3.9, creatinine 1.43, SPEP negative Labs (2/19): K 3.8, creatinine 1.40, hgb 13.4 Labs (5/19): K 4.1, creatinine 1.4, hgb 10.8 Labs (6/19): K 4.2, creatinine 1.4, hgb 12.4 Labs (7/19): K 4, creatinine 1.38 Labs (1/20): K 3.6, creatinine 1.36 Labs (08/2019): K 3.8 Creatinine 2.17  Labs (11/21): SCr 3.84>>2.5 CK 29,000>>150 (admit for rhabdomyolysis)  PMH: 1. HTN 2. Hyperlipidemia 3. 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  LINQ monitor placed.  4. Chronic diastolic CHF: She has Cardiomems.  - LHC (6/17): Normal coronaries.  - Echo (10/18): EF 50-55%, severe LVH with speckled myocardium concerning for amyloidosis, restrictive diastolic function with septal flattening, severe MR, moderate-severe TR, severe biatrial enlargement, PASP 54 mmHg.  - PYP amyloid scan: Grade 3, suggestive of transthyretin amyloidosis.  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   - Echo (5/19): EF 55-60%, moderate LVH c/w amyloidosis, Mitraclip x 2 with mean MV gradient 3 mmHg, trace-mild MR, moderate TR, PASP 62 mmHg.  - Echo (6/19): EF 55-60%, moderate LVH with speckled myocardium, trivial MR s/p Mitraclip with mean gradient 2 mmHg, PASP 61.  - Cardiomems placed 2019  5. Mitral regurgitation: Think mixed functional/degenerative - TEE (7/18): EF 45-50%, moderate-severe MR, mild-moderate TR.  - Echo (10/18) read as severe MR.  - 2/19 echo with severe MR.  - 2/19 cardiac MRI with moderate to severe MR.  - Mitraclip x 2 in 5/19.   - Echo 5/19 post-Mitraclip with mean MV gradient 3 mmHg, trace-mild MR.  6. CKD stage 3 7. Hypothyroidism 8. H/o renal cell CA s/p nephrectomy.  9. Bilateral carpal tunnel releases.   Social History   Socioeconomic History  . Marital status: Divorced    Spouse name: Not on file  . Number of children: 5  . Years of education: 31  . Highest education level: Not on file  Occupational History  . Occupation: retired    Fish farm manager: [employer]  Tobacco Use  . Smoking status: Never Smoker  . Smokeless tobacco: Never Used  Vaping Use  . Vaping Use: Never used  Substance and Sexual Activity  . Alcohol use: No  . Drug use: No  . Sexual activity: Not Currently  Other Topics Concern  . Not on file  Social History Narrative   Lives alone in a one story home.  Her son stays with her some.  Has 5 children, 10 grandchildren and 10 great grandchildren.  Retired Training and development officer at [employer].  Education: high school.        07/14/18- list food insecurity but is aware of food pantries and gets $15 in food stamps, reports issues with medical bills but has insurance and gets extra help through medicaid for premiums- encouraged pt to call billing to set up reasonable payment plan so it doesn't go to collections.      Left Handed   Social  Determinants of Health   Financial Resource Strain: Low Risk   . Difficulty of Paying Living Expenses: Not hard at all  Food Insecurity: No Food Insecurity  . Worried About Charity fundraiser in the Last Year: Never true  . Ran Out of Food in the Last Year: Never true  Transportation Needs: No Transportation Needs  . Lack of Transportation (Medical): No  . Lack of Transportation (Non-Medical): No  Physical Activity: Not on file  Stress: Not on file  Social Connections: Not on file  Intimate Partner Violence: Not on file   Family History  Problem Relation Age of Onset  . Stroke Brother   . Diabetes Mellitus II Brother   . Diabetes Mellitus II Sister   . Breast cancer Sister   . Colon cancer Sister    ROS: All systems reviewed and negative except as per HPI.   Current Outpatient Medications  Medication Sig Dispense Refill  . acetaminophen (TYLENOL) 500 MG tablet Take 1,000 mg by mouth every 6 (six) hours as needed for headache (pain).    Marland Kitchen aspirin 81 MG tablet Take 1 tablet (81 mg total) daily by  mouth.    . butalbital-acetaminophen-caffeine (FIORICET, ESGIC) 50-325-40 MG tablet Take 1 tablet by mouth every 12 (twelve) hours as needed for headache. 14 tablet 0  . carvedilol (COREG) 3.125 MG tablet Take 1 tablet (3.125 mg total) by mouth 2 (two) times daily with a meal. 90 tablet 3  . clopidogrel (PLAVIX) 75 MG tablet Take 1 tablet (75 mg total) by mouth daily. 30 tablet 1  . feeding supplement, ENSURE ENLIVE, (ENSURE ENLIVE) LIQD Take 237 mLs by mouth 2 (two) times daily between meals. 237 mL 0  . furosemide (LASIX) 20 MG tablet Take 1 tablet (20 mg total) by mouth daily. 30 tablet 3  . gabapentin (NEURONTIN) 300 MG capsule Take 1 capsule (300 mg total) by mouth daily.    . Hypromellose (ARTIFICIAL TEARS OP) Place 1 drop into both eyes daily as needed (dry eyes).    Marland Kitchen levothyroxine (SYNTHROID, LEVOTHROID) 25 MCG tablet Take 25 mcg by mouth daily before breakfast.    . potassium  chloride SA (KLOR-CON) 20 MEQ tablet Take 1 tablet (20 mEq total) by mouth daily. 60 tablet 0  . VYNDAMAX 61 MG CAPS TAKE 1 CAPSULE (61 MG) BY MOUTH DAILY. 30 capsule 0   No current facility-administered medications for this encounter.   BP 120/80   Pulse 79   Wt 67.2 kg (148 lb 3.2 oz)   LMP 02/12/2013   SpO2 95%   BMI 26.25 kg/m   Vitals:   08/28/20 1105  BP: 120/80  Pulse: 79  SpO2: 95%    Wt Readings from Last 3 Encounters:  08/28/20 67.2 kg (148 lb 3.2 oz)  07/24/20 68.5 kg (151 lb)  07/07/20 68.5 kg (151 lb)   PHYSICAL EXAM: General:  Walked in the clinic. No resp difficulty HEENT: normal Neck: supple. no JVD. Carotids 2+ bilat; no bruits. No lymphadenopathy or thryomegaly appreciated. Cor: PMI nondisplaced. Regular rate & rhythm. No rubs, gallops or murmurs. Lungs: clear Abdomen: soft, nontender, nondistended. No hepatosplenomegaly. No bruits or masses. Good bowel sounds. Extremities: no cyanosis, clubbing, rash, edema Neuro: alert & orientedx3, cranial nerves grossly intact. moves all 4 extremities w/o difficulty. Affect pleasant  Assessment/Plan: 1. Chronic diastolic CHF: Normal EF on 10/18 echo with severe LVH and speckled myocardium consistent with cardiac amyloidosis.  SPEP was negative. 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 Cardiac MRI also strongly suggestive of cardiac amyloidosis.  Cardiomems placed 2019. Recent echo 11/21 EF 55-60%, mod LVH, nl RV.  NYHA II - Cardiomems Goal 19. Todays reading is 19. Volume status stable.  - Continue carvedilol 3.125 mg BID.  - Continue Tafamidis  2. Mitral regurgitation: Mitral regurgitation seems to fluctuate somewhat based on loading conditions.  Most recently, echo in 2/19 suggested severe MR and cardiac MRI suggested moderate to severe MR (moderate by 41% regurgitant fraction but visually severe). I suspect that  the etiology is mixed - functional as well as degenerative with thickening and restriction of posterior leaflet. She is now s/p Mitraclip procedure in 5/19. Post-clip echo in 6/19 showed mean gradient 2 mmHg across the MV with trace MR. Recent echo 11/21 showed MitraClip well seated w/ only mild MR 3. CKD: Stage IIIa - recent admit for rhabdomyolysis as noted above  - Check BMET  -She is followed by nephrology.  4. CVA: 7/18. Has LINQ monitor, no atrial fibrillation detected so far.  - Continue ASA 81.   - Continue Plavix. - off statin due  to interaction w/ tafamidis. 5. 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  6. Cardiac amyloidosis: Hereditary TTR amyloidosis.  She has peripheral neuropathy as well as bilateral carpal tunnel syndrome.   - She is now on tafamidis. Unfourtunatley developed rhabdomyolysis w/ Crestor (literature review noted several case reports). Crestor has been discontinued  7. PVCs Zio Patch placed 01/2020- NSVT + 5.1% PVCs. Carvedilol was restarted 3.125 mg twice a day with suppression of PVCs.   Discussed Cardiomems reading. Follow up with Dr [name] in 3 months. Check BMET today   Ahmaud Duthie NP-C 08/28/2020 11:22 AM

## 2020-08-28 ENCOUNTER — Encounter (HOSPITAL_COMMUNITY): Payer: Self-pay

## 2020-08-28 ENCOUNTER — Ambulatory Visit (HOSPITAL_COMMUNITY)
Admission: RE | Admit: 2020-08-28 | Discharge: 2020-08-28 | Disposition: A | Discharge status: Home / Self Care | Payer: Medicare Other | Source: Ambulatory Visit | Attending: Adult Health | Admitting: Adult Health

## 2020-08-28 ENCOUNTER — Other Ambulatory Visit: Payer: Self-pay

## 2020-08-28 VITALS — BP 120/80 | HR 79 | Wt 148.2 lb

## 2020-08-28 DIAGNOSIS — M6282 Rhabdomyolysis: Secondary | ICD-10-CM | POA: Insufficient documentation

## 2020-08-28 DIAGNOSIS — I340 Nonrheumatic mitral (valve) insufficiency: Secondary | ICD-10-CM | POA: Insufficient documentation

## 2020-08-28 DIAGNOSIS — I493 Ventricular premature depolarization: Secondary | ICD-10-CM | POA: Insufficient documentation

## 2020-08-28 DIAGNOSIS — Z7982 Long term (current) use of aspirin: Secondary | ICD-10-CM | POA: Insufficient documentation

## 2020-08-28 DIAGNOSIS — I5032 Chronic diastolic (congestive) heart failure: Secondary | ICD-10-CM | POA: Insufficient documentation

## 2020-08-28 DIAGNOSIS — G629 Polyneuropathy, unspecified: Secondary | ICD-10-CM | POA: Diagnosis not present

## 2020-08-28 DIAGNOSIS — Z905 Acquired absence of kidney: Secondary | ICD-10-CM | POA: Insufficient documentation

## 2020-08-28 DIAGNOSIS — T466X5D Adverse effect of antihyperlipidemic and antiarteriosclerotic drugs, subsequent encounter: Secondary | ICD-10-CM | POA: Insufficient documentation

## 2020-08-28 DIAGNOSIS — N179 Acute kidney failure, unspecified: Secondary | ICD-10-CM | POA: Diagnosis not present

## 2020-08-28 DIAGNOSIS — Z79899 Other long term (current) drug therapy: Secondary | ICD-10-CM | POA: Insufficient documentation

## 2020-08-28 DIAGNOSIS — Z8673 Personal history of transient ischemic attack (TIA), and cerebral infarction without residual deficits: Secondary | ICD-10-CM | POA: Insufficient documentation

## 2020-08-28 DIAGNOSIS — G5603 Carpal tunnel syndrome, bilateral upper limbs: Secondary | ICD-10-CM | POA: Diagnosis not present

## 2020-08-28 DIAGNOSIS — I130 Hypertensive heart and chronic kidney disease with heart failure and stage 1 through stage 4 chronic kidney disease, or unspecified chronic kidney disease: Secondary | ICD-10-CM | POA: Diagnosis not present

## 2020-08-28 DIAGNOSIS — Z85528 Personal history of other malignant neoplasm of kidney: Secondary | ICD-10-CM | POA: Insufficient documentation

## 2020-08-28 DIAGNOSIS — N189 Chronic kidney disease, unspecified: Secondary | ICD-10-CM

## 2020-08-28 DIAGNOSIS — E854 Organ-limited amyloidosis: Secondary | ICD-10-CM | POA: Diagnosis not present

## 2020-08-28 DIAGNOSIS — N1831 Chronic kidney disease, stage 3a: Secondary | ICD-10-CM | POA: Diagnosis not present

## 2020-08-28 DIAGNOSIS — I43 Cardiomyopathy in diseases classified elsewhere: Secondary | ICD-10-CM | POA: Insufficient documentation

## 2020-08-28 DIAGNOSIS — Z7902 Long term (current) use of antithrombotics/antiplatelets: Secondary | ICD-10-CM | POA: Diagnosis not present

## 2020-08-28 LAB — BASIC METABOLIC PANEL
Anion gap: 8 (ref 5–15)
BUN: 19 mg/dL (ref 8–23)
CO2: 25 mmol/L (ref 22–32)
Calcium: 10 mg/dL (ref 8.9–10.3)
Chloride: 106 mmol/L (ref 98–111)
Creatinine, Ser: 1.65 mg/dL — ABNORMAL HIGH (ref 0.44–1.00)
GFR, Estimated: 32 mL/min — ABNORMAL LOW (ref >60)
Glucose, Bld: 83 mg/dL (ref 70–99)
Potassium: 4.6 mmol/L (ref 3.5–5.1)
Sodium: 139 mmol/L (ref 135–145)

## 2020-08-28 NOTE — Patient Instructions (Signed)
It was great to see you today! No medication changes are needed at this time.  Labs today We will only contact you if something comes back abnormal or we need to make some changes. Otherwise no news is good news!  Your physician recommends that you schedule a follow-up appointment in: 3 months with Dr [name]  If you have any questions or concerns before your next appointment please send [place] a message through [facility] or call our office at [phone].    TO LEAVE A MESSAGE FOR THE NURSE SELECT OPTION 2, PLEASE LEAVE A MESSAGE INCLUDING: . YOUR NAME . DATE OF BIRTH . CALL BACK NUMBER . REASON FOR CALL**this is important as we prioritize the call backs  [place] AS LONG AS YOU CALL BEFORE 4:00 PM   At the [facility], you and your health needs are our priority. As part of our continuing mission to provide you with exceptional heart care, we have created designated Provider Care Teams. These Care Teams include your primary Cardiologist (physician) and Advanced Practice Providers (APPs- Physician Assistants and Nurse Practitioners) who all work together to provide you with the care you need, when you need it.   You may see any of the following providers on your designated Care Team at your next follow up: Marland Kitchen Dr Glori Bickers . Dr Loralie Champagne . Darrick Grinder, NP . Lyda Jester, [place] . Audry Riles, PharmD   Please be sure to bring in all your medications bottles to every appointment.

## 2020-08-28 NOTE — Progress Notes (Signed)
ReDS Vest / Clip - 08/28/20 1100      ReDS Vest / Clip   Station Marker A    Ruler Value 28    ReDS Value Range Low volume    ReDS Actual Value 34

## 2020-09-02 DIAGNOSIS — R52 Pain, unspecified: Secondary | ICD-10-CM | POA: Diagnosis not present

## 2020-09-09 ENCOUNTER — Other Ambulatory Visit (HOSPITAL_COMMUNITY): Payer: Self-pay | Admitting: Cardiology

## 2020-09-09 MED FILL — VYNDAMAX 61 MG CAPS: 61 | 30 days supply | Qty: 30 | Fill #0

## 2020-09-16 ENCOUNTER — Encounter (HOSPITAL_COMMUNITY): Payer: Self-pay | Admitting: Adult Health

## 2020-09-16 NOTE — Progress Notes (Signed)
   Cardiomems Reading 24. Goal 19   Take 40 mg lasix today and tomorrow.   Called and advised to take increase lasix for the next 2 days.   Rissie Sculley NP-C  11:26 AM

## 2020-09-23 DIAGNOSIS — I639 Cerebral infarction, unspecified: Secondary | ICD-10-CM | POA: Diagnosis not present

## 2020-09-23 DIAGNOSIS — E782 Mixed hyperlipidemia: Secondary | ICD-10-CM | POA: Diagnosis not present

## 2020-09-23 DIAGNOSIS — N183 Chronic kidney disease, stage 3 unspecified: Secondary | ICD-10-CM | POA: Diagnosis not present

## 2020-09-23 DIAGNOSIS — I5042 Chronic combined systolic (congestive) and diastolic (congestive) heart failure: Secondary | ICD-10-CM | POA: Diagnosis not present

## 2020-09-23 DIAGNOSIS — I509 Heart failure, unspecified: Secondary | ICD-10-CM | POA: Diagnosis not present

## 2020-09-23 DIAGNOSIS — E039 Hypothyroidism, unspecified: Secondary | ICD-10-CM | POA: Diagnosis not present

## 2020-09-23 DIAGNOSIS — E7800 Pure hypercholesterolemia, unspecified: Secondary | ICD-10-CM | POA: Diagnosis not present

## 2020-09-23 DIAGNOSIS — I10 Essential (primary) hypertension: Secondary | ICD-10-CM | POA: Diagnosis not present

## 2020-09-23 DIAGNOSIS — C649 Malignant neoplasm of unspecified kidney, except renal pelvis: Secondary | ICD-10-CM | POA: Diagnosis not present

## 2020-09-23 DIAGNOSIS — D0510 Intraductal carcinoma in situ of unspecified breast: Secondary | ICD-10-CM | POA: Diagnosis not present

## 2020-09-30 ENCOUNTER — Telehealth (HOSPITAL_COMMUNITY): Payer: Self-pay | Admitting: Adult Health

## 2020-09-30 MED ORDER — FUROSEMIDE 20 MG PO TABS: 40.0000 mg | ORAL_TABLET | Freq: Every day | ORAL | 3 refills | Status: DC

## 2020-09-30 NOTE — Telephone Encounter (Signed)
  Cardiomems - Goal 19 mm Hg  Reading 25 mm hg   Increase lasix to 40 mg daily.   She appreciated the call. Will follow reading later this week.   Katie [name] 12:08 PM

## 2020-10-02 DIAGNOSIS — I129 Hypertensive chronic kidney disease with stage 1 through stage 4 chronic kidney disease, or unspecified chronic kidney disease: Secondary | ICD-10-CM | POA: Diagnosis not present

## 2020-10-02 DIAGNOSIS — E854 Organ-limited amyloidosis: Secondary | ICD-10-CM | POA: Diagnosis not present

## 2020-10-02 DIAGNOSIS — I43 Cardiomyopathy in diseases classified elsewhere: Secondary | ICD-10-CM | POA: Diagnosis not present

## 2020-10-02 DIAGNOSIS — I5032 Chronic diastolic (congestive) heart failure: Secondary | ICD-10-CM | POA: Diagnosis not present

## 2020-10-02 DIAGNOSIS — E785 Hyperlipidemia, unspecified: Secondary | ICD-10-CM | POA: Diagnosis not present

## 2020-10-02 DIAGNOSIS — I340 Nonrheumatic mitral (valve) insufficiency: Secondary | ICD-10-CM | POA: Diagnosis not present

## 2020-10-02 DIAGNOSIS — N184 Chronic kidney disease, stage 4 (severe): Secondary | ICD-10-CM | POA: Diagnosis not present

## 2020-10-06 ENCOUNTER — Other Ambulatory Visit (HOSPITAL_COMMUNITY): Payer: Self-pay | Admitting: Cardiology

## 2020-10-07 ENCOUNTER — Other Ambulatory Visit (HOSPITAL_COMMUNITY): Payer: Self-pay | Admitting: Cardiology

## 2020-10-10 MED FILL — VYNDAMAX 61 MG CAPS: 61 | 30 days supply | Qty: 30 | Fill #0

## 2020-10-24 ENCOUNTER — Other Ambulatory Visit (HOSPITAL_COMMUNITY): Payer: Self-pay

## 2020-10-28 ENCOUNTER — Other Ambulatory Visit: Payer: Self-pay | Admitting: Internal Medicine

## 2020-10-28 DIAGNOSIS — N63 Unspecified lump in unspecified breast: Secondary | ICD-10-CM

## 2020-11-04 DIAGNOSIS — E7800 Pure hypercholesterolemia, unspecified: Secondary | ICD-10-CM | POA: Diagnosis not present

## 2020-11-04 DIAGNOSIS — I10 Essential (primary) hypertension: Secondary | ICD-10-CM | POA: Diagnosis not present

## 2020-11-04 DIAGNOSIS — Z85528 Personal history of other malignant neoplasm of kidney: Secondary | ICD-10-CM | POA: Diagnosis not present

## 2020-11-04 DIAGNOSIS — E852 Heredofamilial amyloidosis, unspecified: Secondary | ICD-10-CM | POA: Diagnosis not present

## 2020-11-04 DIAGNOSIS — N1832 Chronic kidney disease, stage 3b: Secondary | ICD-10-CM | POA: Diagnosis not present

## 2020-11-04 DIAGNOSIS — E039 Hypothyroidism, unspecified: Secondary | ICD-10-CM | POA: Diagnosis not present

## 2020-11-04 DIAGNOSIS — I5042 Chronic combined systolic (congestive) and diastolic (congestive) heart failure: Secondary | ICD-10-CM | POA: Diagnosis not present

## 2020-11-04 DIAGNOSIS — E8582 Wild-type transthyretin-related (ATTR) amyloidosis: Secondary | ICD-10-CM | POA: Diagnosis not present

## 2020-11-10 ENCOUNTER — Other Ambulatory Visit (HOSPITAL_COMMUNITY): Payer: Self-pay

## 2020-11-10 MED FILL — Tafamidis Cap 61 MG: ORAL | 30 days supply | Qty: 30 | Fill #0 | Status: AC

## 2020-11-11 ENCOUNTER — Other Ambulatory Visit (HOSPITAL_COMMUNITY): Payer: Self-pay

## 2020-11-28 ENCOUNTER — Other Ambulatory Visit: Payer: Self-pay

## 2020-11-28 ENCOUNTER — Ambulatory Visit (HOSPITAL_COMMUNITY)
Admission: RE | Admit: 2020-11-28 | Discharge: 2020-11-28 | Disposition: A | Discharge status: Home / Self Care | Payer: Medicare Other | Source: Ambulatory Visit | Attending: Cardiology | Admitting: Cardiology

## 2020-11-28 ENCOUNTER — Telehealth (HOSPITAL_COMMUNITY): Payer: Self-pay | Admitting: Pharmacist

## 2020-11-28 ENCOUNTER — Other Ambulatory Visit (HOSPITAL_COMMUNITY): Payer: Self-pay

## 2020-11-28 ENCOUNTER — Encounter (HOSPITAL_COMMUNITY): Payer: Self-pay | Admitting: Cardiology

## 2020-11-28 VITALS — BP 124/80 | HR 79 | Wt 148.0 lb

## 2020-11-28 DIAGNOSIS — G5603 Carpal tunnel syndrome, bilateral upper limbs: Secondary | ICD-10-CM | POA: Insufficient documentation

## 2020-11-28 DIAGNOSIS — E852 Heredofamilial amyloidosis, unspecified: Secondary | ICD-10-CM | POA: Diagnosis not present

## 2020-11-28 DIAGNOSIS — Z7902 Long term (current) use of antithrombotics/antiplatelets: Secondary | ICD-10-CM | POA: Insufficient documentation

## 2020-11-28 DIAGNOSIS — Z7982 Long term (current) use of aspirin: Secondary | ICD-10-CM | POA: Insufficient documentation

## 2020-11-28 DIAGNOSIS — E854 Organ-limited amyloidosis: Secondary | ICD-10-CM | POA: Diagnosis not present

## 2020-11-28 DIAGNOSIS — Z85528 Personal history of other malignant neoplasm of kidney: Secondary | ICD-10-CM | POA: Insufficient documentation

## 2020-11-28 DIAGNOSIS — E785 Hyperlipidemia, unspecified: Secondary | ICD-10-CM | POA: Diagnosis not present

## 2020-11-28 DIAGNOSIS — I340 Nonrheumatic mitral (valve) insufficiency: Secondary | ICD-10-CM | POA: Insufficient documentation

## 2020-11-28 DIAGNOSIS — Z79899 Other long term (current) drug therapy: Secondary | ICD-10-CM | POA: Diagnosis not present

## 2020-11-28 DIAGNOSIS — G629 Polyneuropathy, unspecified: Secondary | ICD-10-CM | POA: Insufficient documentation

## 2020-11-28 DIAGNOSIS — I43 Cardiomyopathy in diseases classified elsewhere: Secondary | ICD-10-CM | POA: Diagnosis not present

## 2020-11-28 DIAGNOSIS — Z905 Acquired absence of kidney: Secondary | ICD-10-CM | POA: Insufficient documentation

## 2020-11-28 DIAGNOSIS — Z8673 Personal history of transient ischemic attack (TIA), and cerebral infarction without residual deficits: Secondary | ICD-10-CM | POA: Diagnosis not present

## 2020-11-28 DIAGNOSIS — I130 Hypertensive heart and chronic kidney disease with heart failure and stage 1 through stage 4 chronic kidney disease, or unspecified chronic kidney disease: Secondary | ICD-10-CM | POA: Diagnosis not present

## 2020-11-28 DIAGNOSIS — I5032 Chronic diastolic (congestive) heart failure: Secondary | ICD-10-CM | POA: Insufficient documentation

## 2020-11-28 DIAGNOSIS — N183 Chronic kidney disease, stage 3 unspecified: Secondary | ICD-10-CM | POA: Diagnosis not present

## 2020-11-28 LAB — BASIC METABOLIC PANEL
Anion gap: 6 (ref 5–15)
BUN: 27 mg/dL — ABNORMAL HIGH (ref 8–23)
CO2: 29 mmol/L (ref 22–32)
Calcium: 9.6 mg/dL (ref 8.9–10.3)
Chloride: 104 mmol/L (ref 98–111)
Creatinine, Ser: 1.65 mg/dL — ABNORMAL HIGH (ref 0.44–1.00)
GFR, Estimated: 32 mL/min — ABNORMAL LOW (ref >60)
Glucose, Bld: 79 mg/dL (ref 70–99)
Potassium: 4 mmol/L (ref 3.5–5.1)
Sodium: 139 mmol/L (ref 135–145)

## 2020-11-28 LAB — LIPID PANEL
Cholesterol: 222 mg/dL — ABNORMAL HIGH (ref 0–200)
HDL: 60 mg/dL (ref >40)
LDL Cholesterol: 141 mg/dL — ABNORMAL HIGH (ref 0–99)
Total CHOL/HDL Ratio: 3.7 RATIO
Triglycerides: 104 mg/dL (ref <150)
VLDL: 21 mg/dL (ref 0–40)

## 2020-11-28 MED ORDER — EZETIMIBE 10 MG PO TABS: 10.0000 mg | ORAL_TABLET | Freq: Every day | ORAL | 3 refills | Status: DC

## 2020-11-28 MED ORDER — FUROSEMIDE 20 MG PO TABS: ORAL_TABLET | ORAL | 3 refills | Status: DC

## 2020-11-28 NOTE — Patient Instructions (Addendum)
Increase Furosemide 40 mg in AM and 20 mg in PM  Start Zetia 10 mg Daily  Labs done today, we will call you for abnormal results  Your physician recommends that you return for lab work in: 1-2 weeks  Your physician recommends that you return for lab work in: 2 months  Your physician recommends that you schedule a follow-up appointment in: 3 months  We will start on the paperwork to get you started on Onpattro (Patisiran), this is an infusion that is needed every 3 weeks  If you have any questions or concerns before your next appointment please send [place] a message through [place] or call our office at [phone].    TO LEAVE A MESSAGE FOR THE NURSE SELECT OPTION 2, PLEASE LEAVE A MESSAGE INCLUDING: . YOUR NAME . DATE OF BIRTH . CALL BACK NUMBER . REASON FOR CALL**this is important as we prioritize the call backs  [place] AS LONG AS YOU CALL BEFORE 4:00 PM  At the [facility], you and your health needs are our priority. As part of our continuing mission to provide you with exceptional heart care, we have created designated Provider Care Teams. These Care Teams include your primary Cardiologist (physician) and Advanced Practice Providers (APPs- Physician Assistants and Nurse Practitioners) who all work together to provide you with the care you need, when you need it.   You may see any of the following providers on your designated Care Team at your next follow up: Marland Kitchen Dr Glori Bickers . Dr Loralie Champagne . Dr Vickki Muff . Darrick Grinder, NP . Lyda Jester, [place] . Audry Riles, PharmD   Please be sure to bring in all your medications bottles to every appointment.

## 2020-11-28 NOTE — Telephone Encounter (Signed)
Sent in Enrollment Application to [facility] for Onpattro (patisiran).    Application pending, will continue to follow.   Audry Riles, PharmD, BCPS, BCCP, CPP Heart Failure Clinic Pharmacist [phone]

## 2020-11-30 NOTE — Progress Notes (Addendum)
PCP: Dr. Delfina [name] HF Cardiology: Dr. Aundra [name]  75 y.o. with history of chronic diastolic CHF, mitral regurgitation, CVA, and CKD returns for followup of CHF.   Patient was initially admitted with diastolic CHF in 8/78. Echo showed EF 60-65% with grade 3 diastolic dysfunction and moderate MR.  She had cath showing no coronary disease. 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  TEE this admission showed moderate-severe MR.  She was admitted again in 10/18 with acute on chronic diastolic CHF. Echo this admission showed EF 50-55%, severe LVH, restrictive diastolic dysfunction, severe MR, moderate-severe TR.  She was referred to Dr. Roxy [name] who saw her for mitral valve evaluation.  Upon review of studies at that time, regurgitation thought to be more in the moderate range and likely functional.   Patient had PYP amyloid scan in 12/18, this was suggestive of transthyretin amyloidosis.  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   Admission for A/C diastolic HF 6/7-6/72/0947. Required IV diuresis, then transitioned to 80 mg PO lasix BID. Metop was DC'd due to low blood pressures. Cardiac MRI strongly suggestive of amyloidosis, moderate to severe MR (moderate by regurgitant fraction 41% but severe visually). Echo repeated: EF 40-45%, moderate to severe LVH, severe MR, severe dilation of LA and RA, moderate TR, PASP 49.  She had Mitraclip x 2 placed in 5/19.  Repeat echo post-mitraclip showed EF 55-60%, moderate LVH, mean gradient 3 mmHg across the MV with trace-mild MR.   Cardiomems was placed 6/19.    Echo 6/20 showed EF 55% with moderate LVH, s/p Mitraclip with mean gradient 3 mmHg, mild MR, normal RV, PASP 41 mmHg.   Echo in 11/21 showed EF 50-55%, moderate LVH, normal RV, PASP 44 mmHg, s/p Mitraclip with mean gradient 5 mmHg with mild MR.   She returns today for followup of CHF.  Main complaint is pain from peripheral neuropathy, she has pain in her  hands and feet (burning/tingling).  This is her main limitation currently. No chest pain. No dyspnea walking on flat ground and to walk in stores.  She does fatigue easily.  No orthopnea/PND. No chest pain. No lightheadedness.   Cardiomems: PADP 24 (goal 19)  ECG (personally reviewed): NSR normal  Labs (10/18): K 4.4, creatinine 1.27, LDL 56, HDL 39 Labs (11/18): K 3.9, creatinine 1.43, SPEP negative Labs (2/19): K 3.8, creatinine 1.40, hgb 13.4 Labs (5/19): K 4.1, creatinine 1.4, hgb 10.8 Labs (6/19): K 4.2, creatinine 1.4, hgb 12.4 Labs (7/19): K 4, creatinine 1.38 Labs (1/20): K 3.6, creatinine 1.36 Labs (3/20): K 3.6, creatinine 1.31, LDL 107 Labs (7/20): creatinine 1.48 Labs (10/20): LDL 87 Labs (12/21): LDL 127, BNP 485 Labs (1/22): K 4.6, creatinine 1.65  PMH: 1. HTN 2. Hyperlipidemia 3. 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  LINQ monitor placed.  4. Chronic diastolic CHF: She has Cardiomems.  - LHC (6/17): Normal coronaries.  - Echo (10/18): EF 50-55%, severe LVH with speckled myocardium concerning for amyloidosis, restrictive diastolic function with septal flattening, severe MR, moderate-severe TR, severe biatrial enlargement, PASP 54 mmHg.  - PYP amyloid scan: Grade 3, suggestive of transthyretin amyloidosis.  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   - Echo (5/19): EF 55-60%, moderate LVH c/w amyloidosis, Mitraclip x 2 with mean MV gradient 3 mmHg, trace-mild MR, moderate TR, PASP 62 mmHg.  - Echo (6/19): EF 55-60%, moderate LVH with speckled myocardium,  trivial MR s/p Mitraclip with mean gradient 2 mmHg, PASP 61.  - Echo (6/20): EF 55% with moderate LVH, s/p Mitraclip with mean gradient 3 mmHg, mild MR, normal RV, PASP 41 mmHg.  - Echo (11/21): EF 50-55%, moderate LVH, normal RV, PASP 44 mmHg, s/p Mitraclip with mean gradient 5 mmHg with mild MR. 5. Mitral regurgitation: Think mixed functional/degenerative - TEE (7/18): EF 45-50%, moderate-severe MR, mild-moderate TR.  - Echo (10/18) read as severe MR.  - 2/19 echo with severe MR.  - 2/19 cardiac MRI with moderate to severe MR.  - Mitraclip x 2 in 5/19.   - Echo 5/19 post-Mitraclip with mean MV gradient 3 mmHg, trace-mild MR.  - Echo 6/20 post-Mitraclip with mean MV gradient 3 mmHg, mild MR.  - Echo 11/21 post-Mitraclip with mean MV gradient 5 mmHg, mild MR 6. CKD stage 3 7. Hypothyroidism 8. H/o renal cell CA s/p nephrectomy.  9. Bilateral carpal tunnel releases.  10. ABIs (6/20): Normal  Social History   Socioeconomic History   Marital status: Divorced    Spouse name: Not on file   Number of children: 5   Years of education: 12   Highest education level: Not on file  Occupational History   Occupation: retired    Fish farm manager: [employer]  Tobacco Use   Smoking status: Never Smoker   Smokeless tobacco: Never Used  Scientific laboratory technician Use: Never used  Substance and Sexual Activity   Alcohol use: No   Drug use: No   Sexual activity: Not Currently  Other Topics Concern   Not on file  Social History Narrative   Lives alone in a one story home.  Her son stays with her some.  Has 5 children, 10 grandchildren and 10 great grandchildren.  Retired Training and development officer at [employer].  Education: high school.        07/14/18- list food insecurity but is aware of food pantries and gets $15 in food stamps, reports issues with medical bills but has insurance and gets extra help through medicaid for premiums- encouraged pt to call billing to set up reasonable payment plan so it doesn't go to  collections.      Left Handed   Social Determinants of Health   Financial Resource Strain: Not on file  Food Insecurity: Not on file  Transportation Needs: Not on file  Physical Activity: Not on file  Stress: Not on file  Social Connections: Not on file  Intimate Partner Violence: Not on file   Family History  Problem Relation Age of Onset   Stroke Brother    Diabetes Mellitus II Brother    Diabetes Mellitus II Sister    Breast cancer Sister    Colon cancer Sister    ROS: All systems reviewed and negative except as per HPI.   Current Outpatient Medications  Medication Sig Dispense Refill   acetaminophen (TYLENOL) 500 MG tablet Take 1,000 mg by mouth every 6 (six) hours as needed for headache (pain).     aspirin 81 MG tablet Take 1 tablet (81 mg total) daily by mouth.     butalbital-acetaminophen-caffeine (FIORICET, ESGIC) 50-325-40 MG tablet Take 1 tablet by mouth every 12 (  twelve) hours as needed for headache. 14 tablet 0   carvedilol (COREG) 3.125 MG tablet Take 1 tablet (3.125 mg total) by mouth 2 (two) times daily with a meal. 90 tablet 3   clopidogrel (PLAVIX) 75 MG tablet Take 1 tablet (75 mg total) by mouth daily. 30 tablet 1   ezetimibe (ZETIA) 10 MG tablet Take 1 tablet (10 mg total) by mouth daily. 30 tablet 3   feeding supplement, ENSURE ENLIVE, (ENSURE ENLIVE) LIQD Take 237 mLs by mouth 2 (two) times daily between meals. 237 mL 0   gabapentin (NEURONTIN) 300 MG capsule Take 1 capsule (300 mg total) by mouth daily.     levothyroxine (SYNTHROID, LEVOTHROID) 25 MCG tablet Take 25 mcg by mouth daily before breakfast.     potassium chloride SA (KLOR-CON) 20 MEQ tablet Take 1 tablet (20 mEq total) by mouth daily. 60 tablet 0   Tafamidis 61 MG CAPS TAKE 1 CAPSULE BY MOUTH ONCE A DAY 30 capsule 3   furosemide (LASIX) 20 MG tablet Take 2 tablets (40 mg total) by mouth in the morning AND 1 tablet (20 mg total) every evening. 90 tablet 3   Hypromellose (ARTIFICIAL TEARS OP)  Place 1 drop into both eyes daily as needed (dry eyes).     patisiran 0.3 mg/kg in sodium chloride 0.9 % 200 mL Inject 0.3 mg/kg into the vein every 21 ( twenty-one) days.     No current facility-administered medications for this encounter.   BP 124/80   Pulse 79   Wt 67.1 kg (148 lb)   LMP 02/12/2013   SpO2 99%   BMI 26.22 kg/m  General: NAD Neck: JVP 8 cm, no thyromegaly or thyroid nodule.  Lungs: Clear to auscultation bilaterally with normal respiratory effort. CV: Nondisplaced PMI.  Heart regular S1/S2, no S3/S4, no murmur.  Trace ankle edema.  No carotid bruit.  Normal pedal pulses.  Abdomen: Soft, nontender, no hepatosplenomegaly, no distention.  Skin: Intact without lesions or rashes.  Neurologic: Alert and oriented x 3.  Psych: Normal affect. Extremities: No clubbing or cyanosis.  HEENT: Normal.   Assessment/Plan: 1. Chronic diastolic CHF: Normal EF on 10/18 echo with severe LVH and speckled myocardium consistent with cardiac amyloidosis.  SPEP was negative. 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 Cardiac MRI also strongly suggestive of cardiac amyloidosis. NYHA class II.  She is mildly volume overloaded on exam today with elevated PADP by Cardiomems reading from this morning. - Increase Lasix to 40 qam/20 qpm, BMET today and in 10 days.   - Continue carvedilol 3.125 mg BID. - In future, would add SGLT2 inhibitor.  2. Mitral regurgitation: Mitral regurgitation seems to fluctuate somewhat based on loading conditions.  Most recently, echo in 2/19 suggested severe MR and cardiac MRI suggested moderate to severe MR (moderate by 41% regurgitant fraction but visually severe). I suspect that the etiology is mixed - functional as well as degenerative with thickening and restriction of posterior leaflet. She is now s/p Mitraclip procedure in 5/19. Echo in 11/21 showed mean gradient  5 mmHg across the MV with Mitraclip in place, MR is mild.  3. CKD: Stage III.  BMET today.  4. CVA: 7/18. Has LINQ monitor, no atrial fibrillation detected so far.  - Continue ASA 81.   - Continue Plavix. 5. 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  Symptoms are worsening.  Neuropathy is FAP  1 and PND 1.  6. Cardiac amyloidosis: Hereditary TTR amyloidosis.  She has peripheral neuropathy as well as bilateral carpal tunnel syndrome.   - She is now on tafamidis, no side effects.   - Given symptomatic peripheral neuropathy (FAP 1 and PND 1), would like her to start on patisiran.  I will work on arranging this.  7. Hyperlipidemia: Goal LDL < 70 with CVA.  Last LDL above goal, will add Zetia 10 mg daily with lipids in 2 months.   Followup in 3 months.   Loralie Champagne,  11/30/2020 9:47 AM

## 2020-12-01 ENCOUNTER — Other Ambulatory Visit: Payer: Self-pay | Admitting: Gastroenterology

## 2020-12-01 DIAGNOSIS — I5020 Unspecified systolic (congestive) heart failure: Secondary | ICD-10-CM | POA: Diagnosis not present

## 2020-12-01 DIAGNOSIS — Z800 Family history of malignant neoplasm of digestive organs: Secondary | ICD-10-CM | POA: Diagnosis not present

## 2020-12-01 DIAGNOSIS — R0602 Shortness of breath: Secondary | ICD-10-CM | POA: Diagnosis not present

## 2020-12-04 ENCOUNTER — Other Ambulatory Visit (HOSPITAL_COMMUNITY): Payer: Self-pay

## 2020-12-05 ENCOUNTER — Other Ambulatory Visit (HOSPITAL_COMMUNITY): Payer: Self-pay

## 2020-12-05 MED FILL — Tafamidis Cap 61 MG: ORAL | 30 days supply | Qty: 30 | Fill #1 | Status: AC

## 2020-12-08 ENCOUNTER — Other Ambulatory Visit (HOSPITAL_COMMUNITY): Payer: Medicare Other

## 2020-12-09 ENCOUNTER — Ambulatory Visit
Admission: RE | Admit: 2020-12-09 | Discharge: 2020-12-09 | Disposition: A | Discharge status: Home / Self Care | Payer: Medicare Other | Source: Ambulatory Visit | Attending: Internal Medicine | Admitting: Internal Medicine

## 2020-12-09 ENCOUNTER — Other Ambulatory Visit: Payer: Self-pay

## 2020-12-09 DIAGNOSIS — N6324 Unspecified lump in the left breast, lower inner quadrant: Secondary | ICD-10-CM | POA: Diagnosis not present

## 2020-12-09 DIAGNOSIS — R928 Other abnormal and inconclusive findings on diagnostic imaging of breast: Secondary | ICD-10-CM | POA: Diagnosis not present

## 2020-12-09 DIAGNOSIS — N63 Unspecified lump in unspecified breast: Secondary | ICD-10-CM

## 2020-12-09 DIAGNOSIS — N6323 Unspecified lump in the left breast, lower outer quadrant: Secondary | ICD-10-CM | POA: Diagnosis not present

## 2020-12-10 ENCOUNTER — Other Ambulatory Visit (HOSPITAL_COMMUNITY): Payer: Self-pay

## 2020-12-11 ENCOUNTER — Ambulatory Visit (HOSPITAL_COMMUNITY)
Admission: RE | Admit: 2020-12-11 | Discharge: 2020-12-11 | Disposition: A | Discharge status: Home / Self Care | Payer: Medicare Other | Source: Ambulatory Visit | Attending: Internal Medicine | Admitting: Internal Medicine

## 2020-12-11 ENCOUNTER — Other Ambulatory Visit: Payer: Self-pay

## 2020-12-11 DIAGNOSIS — I5032 Chronic diastolic (congestive) heart failure: Secondary | ICD-10-CM | POA: Diagnosis not present

## 2020-12-11 LAB — BASIC METABOLIC PANEL
Anion gap: 6 (ref 5–15)
BUN: 31 mg/dL — ABNORMAL HIGH (ref 8–23)
CO2: 30 mmol/L (ref 22–32)
Calcium: 9.6 mg/dL (ref 8.9–10.3)
Chloride: 103 mmol/L (ref 98–111)
Creatinine, Ser: 1.84 mg/dL — ABNORMAL HIGH (ref 0.44–1.00)
GFR, Estimated: 28 mL/min — ABNORMAL LOW (ref >60)
Glucose, Bld: 87 mg/dL (ref 70–99)
Potassium: 4 mmol/L (ref 3.5–5.1)
Sodium: 139 mmol/L (ref 135–145)

## 2020-12-12 DIAGNOSIS — I5032 Chronic diastolic (congestive) heart failure: Secondary | ICD-10-CM | POA: Diagnosis not present

## 2020-12-12 DIAGNOSIS — I5033 Acute on chronic diastolic (congestive) heart failure: Secondary | ICD-10-CM | POA: Diagnosis not present

## 2020-12-12 DIAGNOSIS — E039 Hypothyroidism, unspecified: Secondary | ICD-10-CM | POA: Diagnosis not present

## 2020-12-12 DIAGNOSIS — I10 Essential (primary) hypertension: Secondary | ICD-10-CM | POA: Diagnosis not present

## 2020-12-12 DIAGNOSIS — N6320 Unspecified lump in the left breast, unspecified quadrant: Secondary | ICD-10-CM | POA: Diagnosis not present

## 2020-12-12 DIAGNOSIS — E782 Mixed hyperlipidemia: Secondary | ICD-10-CM | POA: Diagnosis not present

## 2020-12-12 DIAGNOSIS — N1832 Chronic kidney disease, stage 3b: Secondary | ICD-10-CM | POA: Diagnosis not present

## 2020-12-12 DIAGNOSIS — I5043 Acute on chronic combined systolic (congestive) and diastolic (congestive) heart failure: Secondary | ICD-10-CM | POA: Diagnosis not present

## 2020-12-12 DIAGNOSIS — I272 Pulmonary hypertension, unspecified: Secondary | ICD-10-CM | POA: Diagnosis not present

## 2020-12-15 ENCOUNTER — Telehealth (HOSPITAL_COMMUNITY): Payer: Self-pay | Admitting: *Deleted

## 2020-12-15 NOTE — Telephone Encounter (Signed)
Received fax from [facility], pt needs clearance for colonoscopy with propofol on 5/20  Per Dr [name]: pt low risk, ok to hold Plavix 5 days prior  Note faxed back to them at [phone]

## 2020-12-16 ENCOUNTER — Other Ambulatory Visit (HOSPITAL_COMMUNITY)
Admission: RE | Admit: 2020-12-16 | Discharge: 2020-12-16 | Disposition: A | Discharge status: Home / Self Care | Payer: Medicare Other | Source: Ambulatory Visit | Attending: Gastroenterology | Admitting: Gastroenterology

## 2020-12-16 ENCOUNTER — Other Ambulatory Visit: Payer: Self-pay

## 2020-12-16 DIAGNOSIS — Z20822 Contact with and (suspected) exposure to covid-19: Secondary | ICD-10-CM | POA: Diagnosis not present

## 2020-12-16 DIAGNOSIS — Z01812 Encounter for preprocedural laboratory examination: Secondary | ICD-10-CM | POA: Insufficient documentation

## 2020-12-16 LAB — SARS CORONAVIRUS 2 (TAT 6-24 HRS): SARS Coronavirus 2: NEGATIVE

## 2020-12-17 ENCOUNTER — Other Ambulatory Visit (HOSPITAL_COMMUNITY): Payer: Self-pay

## 2020-12-19 ENCOUNTER — Ambulatory Visit (HOSPITAL_COMMUNITY)
Admission: RE | Admit: 2020-12-19 | Discharge: 2020-12-19 | Disposition: A | Discharge status: Home / Self Care | Payer: Medicare Other | Source: Ambulatory Visit | Attending: Gastroenterology | Admitting: Gastroenterology

## 2020-12-19 ENCOUNTER — Ambulatory Visit (HOSPITAL_COMMUNITY): Payer: Medicare Other | Admitting: Anesthesiology

## 2020-12-19 ENCOUNTER — Encounter (HOSPITAL_COMMUNITY)
Admission: RE | Disposition: A | Discharge status: Home / Self Care | Payer: Self-pay | Source: Ambulatory Visit | Attending: Gastroenterology

## 2020-12-19 ENCOUNTER — Encounter (HOSPITAL_COMMUNITY): Payer: Self-pay | Admitting: Gastroenterology

## 2020-12-19 ENCOUNTER — Other Ambulatory Visit: Payer: Self-pay

## 2020-12-19 DIAGNOSIS — Z800 Family history of malignant neoplasm of digestive organs: Secondary | ICD-10-CM | POA: Insufficient documentation

## 2020-12-19 DIAGNOSIS — K5730 Diverticulosis of large intestine without perforation or abscess without bleeding: Secondary | ICD-10-CM | POA: Insufficient documentation

## 2020-12-19 DIAGNOSIS — Z1211 Encounter for screening for malignant neoplasm of colon: Secondary | ICD-10-CM | POA: Insufficient documentation

## 2020-12-19 DIAGNOSIS — Z885 Allergy status to narcotic agent status: Secondary | ICD-10-CM | POA: Diagnosis not present

## 2020-12-19 DIAGNOSIS — Z905 Acquired absence of kidney: Secondary | ICD-10-CM | POA: Diagnosis not present

## 2020-12-19 DIAGNOSIS — Z803 Family history of malignant neoplasm of breast: Secondary | ICD-10-CM | POA: Diagnosis not present

## 2020-12-19 DIAGNOSIS — Z888 Allergy status to other drugs, medicaments and biological substances status: Secondary | ICD-10-CM | POA: Insufficient documentation

## 2020-12-19 DIAGNOSIS — I509 Heart failure, unspecified: Secondary | ICD-10-CM | POA: Insufficient documentation

## 2020-12-19 DIAGNOSIS — D120 Benign neoplasm of cecum: Secondary | ICD-10-CM | POA: Diagnosis not present

## 2020-12-19 DIAGNOSIS — Z7902 Long term (current) use of antithrombotics/antiplatelets: Secondary | ICD-10-CM | POA: Diagnosis not present

## 2020-12-19 DIAGNOSIS — I110 Hypertensive heart disease with heart failure: Secondary | ICD-10-CM | POA: Diagnosis not present

## 2020-12-19 DIAGNOSIS — K635 Polyp of colon: Secondary | ICD-10-CM | POA: Diagnosis not present

## 2020-12-19 HISTORY — PX: POLYPECTOMY: SHX5525

## 2020-12-19 HISTORY — PX: COLONOSCOPY WITH PROPOFOL: SHX5780

## 2020-12-19 SURGERY — COLONOSCOPY WITH PROPOFOL
Anesthesia: Monitor Anesthesia Care

## 2020-12-19 MED ORDER — SODIUM CHLORIDE 0.9 % IV SOLN: INTRAVENOUS | Status: DC

## 2020-12-19 MED ORDER — PHENYLEPHRINE HCL (PRESSORS) 10 MG/ML IV SOLN
INTRAVENOUS | Status: DC | PRN
  Administered 2020-12-19 (×2): 80 ug via INTRAVENOUS
  Administered 2020-12-19: 40 ug via INTRAVENOUS
  Administered 2020-12-19: 80 ug via INTRAVENOUS

## 2020-12-19 MED ORDER — PROPOFOL 10 MG/ML IV BOLUS
INTRAVENOUS | Status: DC | PRN
  Administered 2020-12-19 (×4): 10 mg via INTRAVENOUS

## 2020-12-19 MED ORDER — PROPOFOL 500 MG/50ML IV EMUL
INTRAVENOUS | Status: DC | PRN
  Administered 2020-12-19: 110 ug/kg/min via INTRAVENOUS

## 2020-12-19 MED ORDER — LIDOCAINE HCL 1 % IJ SOLN
INTRAMUSCULAR | Status: DC | PRN
  Administered 2020-12-19: 50 mg via INTRADERMAL

## 2020-12-19 MED ORDER — PROPOFOL 500 MG/50ML IV EMUL
INTRAVENOUS | Status: AC
  Filled 2020-12-19: qty 50

## 2020-12-19 SURGICAL SUPPLY — 21 items

## 2020-12-19 NOTE — Anesthesia Preprocedure Evaluation (Addendum)
Anesthesia Evaluation  Patient identified by MRN, date of birth, ID band Patient awake    Reviewed: Allergy & Precautions, NPO status , Patient's Chart, lab work & pertinent test results, reviewed documented beta blocker date and time   Airway Mallampati: I       Dental  (+) Edentulous Upper, Edentulous Lower   Pulmonary    Pulmonary exam normal        Cardiovascular hypertension, Pt. on medications and Pt. on home beta blockers +CHF  Normal cardiovascular exam+ Valvular Problems/Murmurs MR      Neuro/Psych  Headaches, CVA, No Residual Symptoms negative psych ROS   GI/Hepatic negative GI ROS,   Endo/Other  Hypothyroidism   Renal/GU Renal InsufficiencyRenal diseaseCKD III  negative genitourinary   Musculoskeletal negative musculoskeletal ROS (+)   Abdominal Normal abdominal exam  (+)   Peds  Hematology   Anesthesia Other Findings   1. Left ventricular ejection fraction, by estimation, is 50 to 55%. The  left ventricle has low normal function. The left ventricle has no regional  wall motion abnormalities. There is moderate left ventricular hypertrophy.  Left ventricular diastolic  parameters are indeterminate.  2. Right ventricular systolic function is normal. The right ventricular  size is normal. There is mildly elevated pulmonary artery systolic  pressure. The estimated right ventricular systolic pressure is 27.0 mmHg.  3. Left atrial size was severely dilated.  4. MitraClip in place, well seated. The mitral valve is normal in  structure. Mild mitral valve regurgitation. The mean mitral valve gradient  is 5.0 mmHg.  5. Tricuspid valve regurgitation is moderate to severe.  6. The aortic valve is normal in structure. Aortic valve regurgitation is  not visualized. No aortic stenosis is present.  7. The inferior vena cava is normal in size with greater than 50%  respiratory variability, suggesting right  atrial pressure of 3 mmHg.   Comparison(s): No significant change from prior study. Prior images  reviewed side by side.   ILR summary report received. Battery status OK. Normal device function. No new symptom, tachy, brady, or pause episodes. No new AF episodes. Monthly summary reports and ROV/PRN   Kathy Breach, RN, CCDS, [employer]  Medications     Reproductive/Obstetrics                           Anesthesia Physical Anesthesia Plan  ASA: III  Anesthesia Plan: MAC   Post-op Pain Management:    Induction:   PONV Risk Score and Plan: 2 and Treatment may vary due to age or medical condition  Airway Management Planned: Natural Airway, Simple Face Mask and Nasal Cannula  Additional Equipment: None  Intra-op Plan:   Post-operative Plan:   Informed Consent: I have reviewed the patients History and Physical, chart, labs and discussed the procedure including the risks, benefits and alternatives for the proposed anesthesia with the patient or authorized representative who has indicated his/her understanding and acceptance.       Plan Discussed with: CRNA  Anesthesia Plan Comments:         Anesthesia Quick Evaluation                                  Anesthesia Evaluation  Patient identified by MRN, date of birth, ID band Patient awake    Reviewed: Allergy & Precautions, NPO status , Patient's Chart, lab work & pertinent  test results  Airway Mallampati: II  TM Distance: >3 FB Neck ROM: Full    Dental  (+) Edentulous Upper, Edentulous Lower   Pulmonary    breath sounds clear to auscultation       Cardiovascular hypertension,  Rhythm:Regular Rate:Normal + Systolic murmurs    Neuro/Psych    GI/Hepatic   Endo/Other    Renal/GU      Musculoskeletal   Abdominal   Peds  Hematology   Anesthesia Other Findings   Reproductive/Obstetrics                             Anesthesia  Physical Anesthesia Plan  ASA: III  Anesthesia Plan: General   Post-op Pain Management:    Induction: Intravenous  PONV Risk Score and Plan: Ondansetron and Dexamethasone  Airway Management Planned: Oral ETT  Additional Equipment: Arterial line  Intra-op Plan:   Post-operative Plan: Extubation in OR  Informed Consent: I have reviewed the patients History and Physical, chart, labs and discussed the procedure including the risks, benefits and alternatives for the proposed anesthesia with the patient or authorized representative who has indicated his/her understanding and acceptance.     Plan Discussed with: CRNA and Anesthesiologist  Anesthesia Plan Comments:         Anesthesia Quick Evaluation

## 2020-12-19 NOTE — Discharge Instructions (Signed)

## 2020-12-19 NOTE — Op Note (Signed)
[name] Patient Name: Katie [name] Procedure Date: 12/19/2020 MRN: [id number] Attending MD: Carol Ada , MD Date of Birth: 02-Mar-1946 CSN: [id number] Age: 75 Admit Type: Outpatient Procedure:                Colonoscopy Indications:              Screening for colorectal malignant neoplasm,                            Screening in patient at increased risk: Family                            history of 1st-degree relative with colorectal                            cancer before age 59 years Providers:                Carol Ada, MD, [facility]aird [facility], RN, Laverda Sorenson,                            Technician, Karis Juba, CRNA Referring MD:              Medicines:                Propofol per Anesthesia Complications:            No immediate complications. Estimated Blood Loss:     Estimated blood loss: none. Procedure:                Pre-Anesthesia Assessment:                           - Prior to the procedure, a History and Physical                            was performed, and patient medications and                            allergies were reviewed. The patient's tolerance of                            previous anesthesia was also reviewed. The risks                            and benefits of the procedure and the sedation                            options and risks were discussed with the patient.                            All questions were answered, and informed consent                            was obtained. Prior Anticoagulants: The patient has                            taken  no previous anticoagulant or antiplatelet                            agents. ASA Grade Assessment: III - A patient with                            severe systemic disease. After reviewing the risks                            and benefits, the patient was deemed in                            satisfactory condition to undergo the procedure.                           - Sedation was  administered by an anesthesia                            professional. Deep sedation was attained.                           After obtaining informed consent, the colonoscope                            was passed under direct vision. Throughout the                            procedure, the patient's blood pressure, pulse, and                            oxygen saturations were monitored continuously. The                            CF-HQ190L ([id number]) Olympus colonoscope was                            introduced through the anus and advanced to the the                            terminal ileum. The colonoscopy was performed                            without difficulty. The patient tolerated the                            procedure well. The quality of the bowel                            preparation was good. The ileocecal valve,                            appendiceal orifice, and rectum were photographed. Scope In: 1:06:19 PM Scope Out: 1:24:32 PM Scope Withdrawal Time: 0 hours 13 minutes 56 seconds  Total Procedure Duration: 0 hours 18 minutes  13 seconds  Findings:      A 3 mm polyp was found in the cecum. The polyp was sessile. The polyp       was removed with a cold snare. Resection and retrieval were complete.      Scattered small and large-mouthed diverticula were found in the entire       colon. Impression:               - One 3 mm polyp in the cecum, removed with a cold                            snare. Resected and retrieved.                           - Diverticulosis in the entire examined colon. Moderate Sedation:      Not Applicable - Patient had care per Anesthesia. Recommendation:           - Patient has a contact number available for                            emergencies. The signs and symptoms of potential                            delayed complications were discussed with the                            patient. Return to normal activities tomorrow.                             Written discharge instructions were provided to the                            patient.                           - Resume previous diet.                           - Continue present medications.                           - Repeat colonoscopy in 5 years for surveillance,                            if clinically appropriate. Procedure Code(s):        --- Professional ---                           [phone], Colonoscopy, flexible; with removal of                            tumor(s), polyp(s), or other lesion(s) by snare                            technique Diagnosis Code(s):        --- Professional ---  K63.5, Polyp of colon                           Z12.11, Encounter for screening for malignant                            neoplasm of colon                           Z80.0, Family history of malignant neoplasm of                            digestive organs                           K57.30, Diverticulosis of large intestine without                            perforation or abscess without bleeding CPT copyright 2019 [employer]. All rights reserved. The codes documented in this report are preliminary and upon coder review may  be revised to meet current compliance requirements. Carol Ada, MD Carol Ada, MD 12/19/2020 1:37:12 PM This report has been signed electronically. Number of Addenda: 0

## 2020-12-19 NOTE — Transfer of Care (Signed)
Immediate Anesthesia Transfer of Care Note  Patient: Katie [name]  Procedure(s) Performed: COLONOSCOPY WITH PROPOFOL (N/A ) POLYPECTOMY  Patient Location: PACU  Anesthesia Type:MAC  Level of Consciousness: awake, alert  and patient cooperative  Airway & Oxygen Therapy: Patient Spontanous Breathing and Patient connected to face mask oxygen  Post-op Assessment: Report given to RN and Post -op Vital signs reviewed and stable  Post vital signs: Reviewed and stable  Last Vitals:  Vitals Value Taken Time  BP    Temp    Pulse    Resp    SpO2      Last Pain:  Vitals:   12/19/20 1334  TempSrc:   PainSc: 0-No pain         Complications: No complications documented.

## 2020-12-19 NOTE — H&P (Signed)
Katie [name]   HPI: At this time the patient denies any problems with nausea, vomiting, fevers, chills, abdominal pain, diarrhea, constipation, hematochezia, melena, GERD, or dysphagia. The patient's sister died of colon cancer in her 46's and another sister was recently diagnosed with colon cancer. This is a younger sister in her 60's. No complaints of chest pain, SOB, MI, or sleep apnea. Her last colonoscopy on 09/10/2010 only showed diverticula. Her colonoscopy on in 2007 was positive for an adenomatous polyp. Dr. Aundra [name] manages her CHF and she is on Plavix.    Past Medical History:  Diagnosis Date  . 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 Cancer [facility])    kidney cancer  . Chronic combined systolic and diastolic CHF (congestive heart failure) ([place])   . CKD (chronic kidney disease), stage III ([place])    removal of right kidney due to carcinoma  . Dyspnea   . Headache   . History of kidney cancer    a. s/p right nephrectomy ~2007.  Marland Kitchen History of loop recorder   . Hypercholesterolemia   . Hypertension   . Hypothyroidism   . Mitral regurgitation   . Pulmonary hypertension ([place])   . Stroke [facility])    no residual    Past Surgical History:  Procedure Laterality Date  . BREAST EXCISIONAL BIOPSY Left   . BREAST EXCISIONAL BIOPSY Right   . CARDIAC CATHETERIZATION N/A 01/12/2016   Procedure: Right/Left Heart Cath and Coronary/Graft Angiography;  Surgeon: Katie Crome, MD;  Location: [place] [facility];  Service: Cardiovascular;  Laterality: N/A;  . CERVICAL POLYPECTOMY N/A 04/23/2013   Procedure: CERVICAL POLYPECTOMY;  Surgeon: Katie Sarah. Landry Mellow, MD;  Location: [place] [facility];  Service: Gynecology;  Laterality: N/A;  Possible Polypectomy  . HYSTEROSCOPY WITH D & C N/A 04/23/2013   Procedure: DILATATION AND CURETTAGE /HYSTEROSCOPY;  Surgeon: Katie Sarah. Landry Mellow, MD;  Location: [place] [facility];  Service: Gynecology;  Laterality: N/A;  . IR ANGIO INTRA EXTRACRAN SEL COM CAROTID INNOMINATE BILAT MOD SED  02/11/2017  . IR ANGIO  VERTEBRAL SEL VERTEBRAL BILAT MOD SED  02/11/2017  . KNEE ARTHROSCOPY Left   . LOOP RECORDER INSERTION N/A 02/18/2017   Procedure: Loop Recorder Insertion;  Surgeon: Katie Haw, MD;  Location: [place] [facility];  Service: Cardiovascular;  Laterality: N/A;  . MITRAL VALVE REPAIR  11/30/2017  . MITRAL VALVE REPAIR N/A 11/30/2017   Procedure: MITRAL VALVE REPAIR;  Surgeon: Katie Mocha, MD;  Location: [place] [facility];  Service: Cardiovascular;  Laterality: N/A;  . NEPHRECTOMY Right    due to carcinoma  . TEE WITHOUT CARDIOVERSION N/A 02/18/2017   Procedure: TRANSESOPHAGEAL ECHOCARDIOGRAM (TEE) WITH LOOP;  Surgeon: Katie Fredrickson Wonda Cheng, MD;  Location: [facility];  Service: Cardiovascular;  Laterality: N/A;    Family History  Problem Relation Age of Onset  . Stroke Brother   . Diabetes Mellitus II Brother   . Diabetes Mellitus II Sister   . Breast cancer Sister   . Colon cancer Sister     Social History:  reports that she has never smoked. She has never used smokeless tobacco. She reports that she does not drink alcohol and does not use drugs.  Allergies:  Allergies  Allergen Reactions  . Crestor [Rosuvastatin] Other (See Comments)    Drug interaction w/ tafamidis, caused rhabdomyolysis  . Tramadol Shortness Of Breath and Palpitations    Medications: Scheduled: Continuous:  No results found for this or any previous visit (from the past 24 hour(s)).  No results found.  ROS:  As stated above in the HPI otherwise negative.  Height 5\' 6"  (1.676 m), weight 64.9 kg, last menstrual period 02/12/2013.    PE: Gen: NAD, Alert and Oriented HEENT:  [place]/AT, EOMI Neck: Supple, no LAD Lungs: CTA Bilaterally CV: RRR without M/G/R ABD: Soft, NTND, +BS Ext: No C/C/E  Assessment/Plan: 1) Family history of colon cancer - colonoscopy.  Katie [name] D 12/19/2020, 12:26 PM

## 2020-12-22 ENCOUNTER — Encounter (HOSPITAL_COMMUNITY): Payer: Self-pay | Admitting: Gastroenterology

## 2020-12-22 LAB — SURGICAL PATHOLOGY

## 2020-12-24 ENCOUNTER — Telehealth (HOSPITAL_COMMUNITY): Payer: Self-pay | Admitting: Adult Health

## 2020-12-24 NOTE — Telephone Encounter (Signed)
Cardiomems Goal 19   Todays reading 23.   Called and asked to increase lasix 40 mg twice a day x 2 days the back to 40 mg/20 mg   Kina Shiffman Np_C  2:42 PM

## 2020-12-29 NOTE — Anesthesia Postprocedure Evaluation (Signed)
Anesthesia Post Note  Patient: Katie [name]  Procedure(s) Performed: COLONOSCOPY WITH PROPOFOL (N/A ) POLYPECTOMY     Patient location during evaluation: Endoscopy Anesthesia Type: MAC Level of consciousness: awake Pain management: pain level controlled Vital Signs Assessment: post-procedure vital signs reviewed and stable Respiratory status: spontaneous breathing Cardiovascular status: stable Postop Assessment: no apparent nausea or vomiting Anesthetic complications: no   No complications documented.  Last Vitals:  Vitals:   12/19/20 1350 12/19/20 1400  BP: 116/69 125/78  Pulse: 73 75  Resp: 11 10  Temp:    SpO2: 100% 99%    Last Pain:  Vitals:   12/22/20 1406  TempSrc:   PainSc: 0-No pain                 Huston Foley

## 2021-01-06 ENCOUNTER — Other Ambulatory Visit (HOSPITAL_COMMUNITY): Payer: Self-pay

## 2021-01-06 MED FILL — Tafamidis Cap 61 MG: ORAL | 30 days supply | Qty: 30 | Fill #2 | Status: AC

## 2021-01-09 NOTE — Addendum Note (Signed)
Encounter addended by: Larey Dresser, MD on: 01/09/2021 11:38 AM  Actions taken: Clinical Note Signed

## 2021-01-12 ENCOUNTER — Other Ambulatory Visit (HOSPITAL_COMMUNITY): Payer: Self-pay

## 2021-01-12 DIAGNOSIS — H04123 Dry eye syndrome of bilateral lacrimal glands: Secondary | ICD-10-CM | POA: Diagnosis not present

## 2021-01-12 DIAGNOSIS — H2513 Age-related nuclear cataract, bilateral: Secondary | ICD-10-CM | POA: Diagnosis not present

## 2021-01-12 DIAGNOSIS — H524 Presbyopia: Secondary | ICD-10-CM | POA: Diagnosis not present

## 2021-01-12 DIAGNOSIS — H353131 Nonexudative age-related macular degeneration, bilateral, early dry stage: Secondary | ICD-10-CM | POA: Diagnosis not present

## 2021-01-12 DIAGNOSIS — H401131 Primary open-angle glaucoma, bilateral, mild stage: Secondary | ICD-10-CM | POA: Diagnosis not present

## 2021-01-15 DIAGNOSIS — E782 Mixed hyperlipidemia: Secondary | ICD-10-CM | POA: Diagnosis not present

## 2021-01-15 DIAGNOSIS — E7800 Pure hypercholesterolemia, unspecified: Secondary | ICD-10-CM | POA: Diagnosis not present

## 2021-01-15 DIAGNOSIS — I5043 Acute on chronic combined systolic (congestive) and diastolic (congestive) heart failure: Secondary | ICD-10-CM | POA: Diagnosis not present

## 2021-01-15 DIAGNOSIS — E039 Hypothyroidism, unspecified: Secondary | ICD-10-CM | POA: Diagnosis not present

## 2021-01-15 DIAGNOSIS — N1832 Chronic kidney disease, stage 3b: Secondary | ICD-10-CM | POA: Diagnosis not present

## 2021-01-15 DIAGNOSIS — I639 Cerebral infarction, unspecified: Secondary | ICD-10-CM | POA: Diagnosis not present

## 2021-01-15 DIAGNOSIS — I10 Essential (primary) hypertension: Secondary | ICD-10-CM | POA: Diagnosis not present

## 2021-01-15 DIAGNOSIS — H35033 Hypertensive retinopathy, bilateral: Secondary | ICD-10-CM | POA: Diagnosis not present

## 2021-01-15 DIAGNOSIS — I272 Pulmonary hypertension, unspecified: Secondary | ICD-10-CM | POA: Diagnosis not present

## 2021-01-16 ENCOUNTER — Telehealth (HOSPITAL_COMMUNITY): Payer: Self-pay | Admitting: Pharmacy Technician

## 2021-01-16 NOTE — Telephone Encounter (Signed)
Advanced Heart Failure Patient Astronomer PA for Onpattro to [place].  Will follow up.

## 2021-01-23 ENCOUNTER — Telehealth (INDEPENDENT_AMBULATORY_CARE_PROVIDER_SITE_OTHER): Payer: Medicare Other | Admitting: Neurology

## 2021-01-23 ENCOUNTER — Encounter: Payer: Self-pay | Admitting: Neurology

## 2021-01-23 ENCOUNTER — Other Ambulatory Visit: Payer: Self-pay

## 2021-01-23 VITALS — Ht 63.0 in | Wt 142.0 lb

## 2021-01-23 DIAGNOSIS — E854 Organ-limited amyloidosis: Secondary | ICD-10-CM

## 2021-01-23 DIAGNOSIS — G990 Autonomic neuropathy in diseases classified elsewhere: Secondary | ICD-10-CM | POA: Diagnosis not present

## 2021-01-23 DIAGNOSIS — D472 Monoclonal gammopathy: Secondary | ICD-10-CM | POA: Diagnosis not present

## 2021-01-23 DIAGNOSIS — I639 Cerebral infarction, unspecified: Secondary | ICD-10-CM | POA: Diagnosis not present

## 2021-01-23 NOTE — Progress Notes (Signed)
   Due to the COVID-19 crisis, a virtual visit was done.  Patient unable to connect via video, so switched to telephone visit from my office and it was initiated and consent given by this patient and or family.  Telephone (Audio) Visit The purpose of this telephone visit is to provide medical care while limiting exposure to the novel coronavirus.    Consent was obtained for telephone visit and initiated by pt/family:  Yes.   Answered questions that patient had about telehealth interaction:  Yes.   I discussed the limitations, risks, security and privacy concerns of performing an evaluation and management service by telephone. I also discussed with the patient that there may be a patient responsible charge related to this service. The patient expressed understanding and agreed to proceed.  Pt location: Home Physician Location: office Name of referring provider:  Seward Carol, MD I connected with .Katie [name] at patients initiation/request on 01/23/2021 at  8:30 AM EDT by telephone and verified that I am speaking with the correct person using two identifiers.  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 Neurologically, she has nonlength dependent neuropathy and history of bilateral CTS release.  Stable  - She has been approved for patisiran and should be starting home infusion soon  - Continue gabapentin 300mg  twice daily - renally dose  - Continue to use a cane.  Fall precautions discussed  - Check feet daily  2.  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  - Continue ASA 81mg  + plavix 75mg  daily  - Continue Zetia 10mg  daily   Need for in person visit now:  No.  Subjective:  Patient here for 6 month follow-up.  She was recently approved for patisiran and will be starting infusions.  She initially did not want to be on therapy.  She denies any  progression of neuropathy and continues to have tingling and numbness in the hands, less so in the feet.  No falls. She uses a cane.  No interval hospitalizations or illnesses or stroke.  She is compliant with medications.  No new complaints.   Objective:   Vitals:   01/23/21 0754  Weight: 142 lb (64.4 kg)  Height: 5\' 3"  (1.6 m)     Follow Up Instructions:      -I discussed the assessment and treatment plan with the patient. The patient was provided an opportunity to ask questions and all were answered. The patient agreed with the plan and demonstrated an understanding of the instructions.   The patient was advised to call back or seek an in-person evaluation if the symptoms worsen or if the condition fails to improve as anticipated.    Total Time spent in visit with the patient was:  12 min, of which 100% of the time was spent in counseling and/or coordinating care.   Pt understands and agrees with the plan of care outlined.     Alda Berthold, DO

## 2021-01-28 ENCOUNTER — Other Ambulatory Visit (HOSPITAL_COMMUNITY): Payer: Medicare Other

## 2021-01-29 ENCOUNTER — Ambulatory Visit (HOSPITAL_COMMUNITY)
Admission: RE | Admit: 2021-01-29 | Discharge: 2021-01-29 | Disposition: A | Discharge status: Home / Self Care | Payer: Medicare Other | Source: Ambulatory Visit | Attending: Internal Medicine | Admitting: Internal Medicine

## 2021-01-29 ENCOUNTER — Other Ambulatory Visit: Payer: Self-pay

## 2021-01-29 DIAGNOSIS — I5032 Chronic diastolic (congestive) heart failure: Secondary | ICD-10-CM | POA: Diagnosis not present

## 2021-01-29 LAB — BASIC METABOLIC PANEL
Anion gap: 9 (ref 5–15)
BUN: 30 mg/dL — ABNORMAL HIGH (ref 8–23)
CO2: 27 mmol/L (ref 22–32)
Calcium: 9.9 mg/dL (ref 8.9–10.3)
Chloride: 102 mmol/L (ref 98–111)
Creatinine, Ser: 1.67 mg/dL — ABNORMAL HIGH (ref 0.44–1.00)
GFR, Estimated: 32 mL/min — ABNORMAL LOW (ref >60)
Glucose, Bld: 89 mg/dL (ref 70–99)
Potassium: 4.1 mmol/L (ref 3.5–5.1)
Sodium: 138 mmol/L (ref 135–145)

## 2021-01-29 LAB — HEPATIC FUNCTION PANEL
ALT: 15 U/L (ref 0–44)
AST: 25 U/L (ref 15–41)
Albumin: 3.5 g/dL (ref 3.5–5.0)
Alkaline Phosphatase: 146 U/L — ABNORMAL HIGH (ref 38–126)
Bilirubin, Direct: 0.1 mg/dL (ref 0.0–0.2)
Indirect Bilirubin: 0.9 mg/dL (ref 0.3–0.9)
Total Bilirubin: 1 mg/dL (ref 0.3–1.2)
Total Protein: 8.8 g/dL — ABNORMAL HIGH (ref 6.5–8.1)

## 2021-01-29 LAB — LIPID PANEL
Cholesterol: 216 mg/dL — ABNORMAL HIGH (ref 0–200)
HDL: 59 mg/dL (ref >40)
LDL Cholesterol: 142 mg/dL — ABNORMAL HIGH (ref 0–99)
Total CHOL/HDL Ratio: 3.7 RATIO
Triglycerides: 73 mg/dL (ref <150)
VLDL: 15 mg/dL (ref 0–40)

## 2021-02-03 ENCOUNTER — Other Ambulatory Visit (HOSPITAL_COMMUNITY): Payer: Self-pay

## 2021-02-03 ENCOUNTER — Other Ambulatory Visit (HOSPITAL_COMMUNITY): Payer: Self-pay | Admitting: Cardiology

## 2021-02-03 MED ORDER — VYNDAMAX 61 MG PO CAPS
1.0000 | ORAL_CAPSULE | Freq: Every day | ORAL | 3 refills | Status: DC
  Filled 2021-02-03: qty 30, 30d supply, fill #0
  Filled 2021-03-11: qty 30, 30d supply, fill #1
  Filled 2021-04-09: qty 30, 30d supply, fill #2
  Filled 2021-05-05: qty 30, 30d supply, fill #3

## 2021-02-05 ENCOUNTER — Telehealth (HOSPITAL_COMMUNITY): Payer: Self-pay | Admitting: *Deleted

## 2021-02-05 DIAGNOSIS — E785 Hyperlipidemia, unspecified: Secondary | ICD-10-CM

## 2021-02-05 DIAGNOSIS — Z8673 Personal history of transient ischemic attack (TIA), and cerebral infarction without residual deficits: Secondary | ICD-10-CM

## 2021-02-05 NOTE — Telephone Encounter (Signed)
Katie Dresser, MD  01/31/2021 11:48 AM EDT      Had statin interaction with tafamidis, LDL high on Zetia with history of CVA. Would refer to lipid clinic for PCSK9 inhibitor.     Pt aware, she states she does not want to go anywhere else right now, advised I would place referral and when they call her to schedule she can make it out amonth or so, she is agreeable with this plan

## 2021-02-05 NOTE — Telephone Encounter (Signed)
-----   Message from Larey Dresser, MD sent at 01/31/2021 11:48 AM EDT ----- Had statin interaction with tafamidis, LDL high on Zetia with history of CVA.  Would refer to lipid clinic for PCSK9 inhibitor.

## 2021-02-10 ENCOUNTER — Other Ambulatory Visit (HOSPITAL_COMMUNITY): Payer: Self-pay

## 2021-02-18 NOTE — Telephone Encounter (Addendum)
Advanced Heart Failure Patient Advocate Encounter  Prior Authorization for Onpattro has been approved through 08/01/21. Patient is working with [place] assist to assess for financial assistance.

## 2021-03-02 ENCOUNTER — Encounter (HOSPITAL_COMMUNITY): Payer: Self-pay | Admitting: Cardiology

## 2021-03-02 ENCOUNTER — Other Ambulatory Visit (HOSPITAL_COMMUNITY): Payer: Self-pay

## 2021-03-02 ENCOUNTER — Ambulatory Visit (HOSPITAL_COMMUNITY)
Admission: RE | Admit: 2021-03-02 | Discharge: 2021-03-02 | Disposition: A | Discharge status: Home / Self Care | Payer: Medicare Other | Source: Ambulatory Visit | Attending: Cardiology | Admitting: Cardiology

## 2021-03-02 ENCOUNTER — Other Ambulatory Visit: Payer: Self-pay

## 2021-03-02 ENCOUNTER — Telehealth (HOSPITAL_COMMUNITY): Payer: Self-pay | Admitting: Pharmacy Technician

## 2021-03-02 VITALS — BP 126/70 | HR 80 | Wt 154.0 lb

## 2021-03-02 DIAGNOSIS — I130 Hypertensive heart and chronic kidney disease with heart failure and stage 1 through stage 4 chronic kidney disease, or unspecified chronic kidney disease: Secondary | ICD-10-CM | POA: Diagnosis not present

## 2021-03-02 DIAGNOSIS — Z79899 Other long term (current) drug therapy: Secondary | ICD-10-CM | POA: Insufficient documentation

## 2021-03-02 DIAGNOSIS — Z7982 Long term (current) use of aspirin: Secondary | ICD-10-CM | POA: Diagnosis not present

## 2021-03-02 DIAGNOSIS — I5032 Chronic diastolic (congestive) heart failure: Secondary | ICD-10-CM | POA: Diagnosis not present

## 2021-03-02 DIAGNOSIS — Z7902 Long term (current) use of antithrombotics/antiplatelets: Secondary | ICD-10-CM | POA: Insufficient documentation

## 2021-03-02 DIAGNOSIS — G5603 Carpal tunnel syndrome, bilateral upper limbs: Secondary | ICD-10-CM | POA: Diagnosis not present

## 2021-03-02 DIAGNOSIS — Z7984 Long term (current) use of oral hypoglycemic drugs: Secondary | ICD-10-CM | POA: Insufficient documentation

## 2021-03-02 DIAGNOSIS — E854 Organ-limited amyloidosis: Secondary | ICD-10-CM | POA: Diagnosis not present

## 2021-03-02 DIAGNOSIS — I340 Nonrheumatic mitral (valve) insufficiency: Secondary | ICD-10-CM | POA: Insufficient documentation

## 2021-03-02 DIAGNOSIS — N183 Chronic kidney disease, stage 3 unspecified: Secondary | ICD-10-CM | POA: Insufficient documentation

## 2021-03-02 DIAGNOSIS — I43 Cardiomyopathy in diseases classified elsewhere: Secondary | ICD-10-CM | POA: Diagnosis not present

## 2021-03-02 DIAGNOSIS — Z8673 Personal history of transient ischemic attack (TIA), and cerebral infarction without residual deficits: Secondary | ICD-10-CM | POA: Diagnosis not present

## 2021-03-02 DIAGNOSIS — E785 Hyperlipidemia, unspecified: Secondary | ICD-10-CM | POA: Diagnosis not present

## 2021-03-02 DIAGNOSIS — G629 Polyneuropathy, unspecified: Secondary | ICD-10-CM | POA: Diagnosis not present

## 2021-03-02 DIAGNOSIS — Z79890 Hormone replacement therapy (postmenopausal): Secondary | ICD-10-CM | POA: Diagnosis not present

## 2021-03-02 LAB — BASIC METABOLIC PANEL
Anion gap: 7 (ref 5–15)
BUN: 36 mg/dL — ABNORMAL HIGH (ref 8–23)
CO2: 29 mmol/L (ref 22–32)
Calcium: 9.9 mg/dL (ref 8.9–10.3)
Chloride: 101 mmol/L (ref 98–111)
Creatinine, Ser: 1.65 mg/dL — ABNORMAL HIGH (ref 0.44–1.00)
GFR, Estimated: 32 mL/min — ABNORMAL LOW (ref >60)
Glucose, Bld: 91 mg/dL (ref 70–99)
Potassium: 3.8 mmol/L (ref 3.5–5.1)
Sodium: 137 mmol/L (ref 135–145)

## 2021-03-02 MED ORDER — EMPAGLIFLOZIN 10 MG PO TABS
10.0000 mg | ORAL_TABLET | Freq: Every day | ORAL | 11 refills | Status: DC
  Filled 2021-03-02: qty 30, 30d supply, fill #0

## 2021-03-02 NOTE — Progress Notes (Signed)
PCP: Dr. [name] HF Cardiology: Dr. [name]  75 y.o. with history of chronic diastolic CHF, mitral regurgitation, CVA, and CKD returns for followup of CHF.   Patient was initially admitted with diastolic CHF in 1/24. Echo showed EF 60-65% with grade 3 diastolic dysfunction and moderate MR.  She had cath showing no coronary disease. 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  TEE this admission showed moderate-severe MR.  She was admitted again in 10/18 with acute on chronic diastolic CHF. Echo this admission showed EF 50-55%, severe LVH, restrictive diastolic dysfunction, severe MR, moderate-severe TR.  She was referred to Dr. [name] who saw her for mitral valve evaluation.  Upon review of studies at that time, regurgitation thought to be more in the moderate range and likely functional.   Patient had PYP amyloid scan in 12/18, this was suggestive of transthyretin amyloidosis.  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   Admission for A/C diastolic HF 5/8-0/99/8338. Required IV diuresis, then transitioned to 80 mg PO lasix BID. Metop was DC'd due to low blood pressures. Cardiac MRI strongly suggestive of amyloidosis, moderate to severe MR (moderate by regurgitant fraction 41% but severe visually). Echo repeated: EF 40-45%, moderate to severe LVH, severe MR, severe dilation of LA and RA, moderate TR, PASP 49.  She had Mitraclip x 2 placed in 5/19.  Repeat echo post-mitraclip showed EF 55-60%, moderate LVH, mean gradient 3 mmHg across the MV with trace-mild MR.   Cardiomems was placed 6/19.    Echo 6/20 showed EF 55% with moderate LVH, s/p Mitraclip with mean gradient 3 mmHg, mild MR, normal RV, PASP 41 mmHg.   Echo in 11/21 showed EF 50-55%, moderate LVH, normal RV, PASP 44 mmHg, s/p Mitraclip with mean gradient 5 mmHg with mild MR.   She returns today for followup of CHF.  Weight is up 6 lbs.  She denies dyspnea walking on flat ground though  she fatigues easily.  No chest pain.  No orthopnea/PND.  Main complaint is hip pain and neuropathic pain in her feet.    Cardiomems: PADP 20 (goal 19)  Labs (10/18): K 4.4, creatinine 1.27, LDL 56, HDL 39 Labs (11/18): K 3.9, creatinine 1.43, SPEP negative Labs (2/19): K 3.8, creatinine 1.40, hgb 13.4 Labs (5/19): K 4.1, creatinine 1.4, hgb 10.8 Labs (6/19): K 4.2, creatinine 1.4, hgb 12.4 Labs (7/19): K 4, creatinine 1.38 Labs (1/20): K 3.6, creatinine 1.36 Labs (3/20): K 3.6, creatinine 1.31, LDL 107 Labs (7/20): creatinine 1.48 Labs (10/20): LDL 87 Labs (12/21): LDL 127, BNP 485 Labs (1/22): K 4.6, creatinine 1.65 Labs (6/22): K 4.1, creatinine 1.67, LDL 142  PMH: 1. HTN 2. Hyperlipidemia 3. 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  LINQ monitor placed.  4. Chronic diastolic CHF: She has Cardiomems.  - LHC (6/17): Normal coronaries.  - Echo (10/18): EF 50-55%, severe LVH with speckled myocardium concerning for amyloidosis, restrictive diastolic function with septal flattening, severe MR, moderate-severe TR, severe biatrial enlargement, PASP 54 mmHg.  - PYP amyloid scan: Grade 3, suggestive of transthyretin amyloidosis.  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   - Echo (5/19): EF 55-60%, moderate LVH c/w amyloidosis, Mitraclip x 2 with mean MV gradient 3 mmHg, trace-mild MR, moderate TR, PASP 62 mmHg.  - Echo (6/19): EF 55-60%, moderate LVH with speckled myocardium, trivial MR s/p Mitraclip with mean gradient 2 mmHg, PASP 61.  - Echo (6/20): EF 55% with moderate LVH, s/p  Mitraclip with mean gradient 3 mmHg, mild MR, normal RV, PASP 41 mmHg.  - Echo (11/21): EF 50-55%, moderate LVH, normal RV, PASP 44 mmHg, s/p Mitraclip with mean gradient 5 mmHg with mild MR. 5. Mitral regurgitation: Think mixed functional/degenerative - TEE (7/18): EF 45-50%, moderate-severe MR, mild-moderate TR.  - Echo (10/18) read as severe MR.  - 2/19 echo with severe MR.  - 2/19 cardiac MRI with moderate to severe MR.  - Mitraclip x 2 in 5/19.   - Echo 5/19 post-Mitraclip with mean MV gradient 3 mmHg, trace-mild MR.  - Echo 6/20 post-Mitraclip with mean MV gradient 3 mmHg, mild MR.  - Echo 11/21 post-Mitraclip with mean MV gradient 5 mmHg, mild MR 6. CKD stage 3 7. Hypothyroidism 8. H/o renal cell CA s/p nephrectomy.  9. Bilateral carpal tunnel releases.  10. ABIs (6/20): Normal  Social History   Socioeconomic History   Marital status: Divorced    Spouse name: Not on file   Number of children: 5   Years of education: 12   Highest education level: Not on file  Occupational History   Occupation: retired    Fish farm manager: [employer]  Tobacco Use   Smoking status: Never   Smokeless tobacco: Never  Vaping Use   Vaping Use: Never used  Substance and Sexual Activity   Alcohol use: No   Drug use: No   Sexual activity: Not Currently  Other Topics Concern   Not on file  Social History Narrative   Lives alone in a one story home.  Her son stays with her some.  Has 5 children, 10 grandchildren and 10 great grandchildren.  Retired Training and development officer at [employer].  Education: high school.        07/14/18- list food insecurity but is aware of food pantries and gets $15 in food stamps, reports issues with medical bills but has insurance and gets extra help through medicaid for premiums- encouraged pt to call billing to set up reasonable payment plan so it doesn't go to collections.      Left Handed   Social Determinants of Health   Financial Resource Strain: Not on file  Food  Insecurity: Not on file  Transportation Needs: Not on file  Physical Activity: Not on file  Stress: Not on file  Social Connections: Not on file  Intimate Partner Violence: Not on file   Family History  Problem Relation Age of Onset   Stroke Brother    Diabetes Mellitus II Brother    Diabetes Mellitus II Sister    Breast cancer Sister    Colon cancer Sister    ROS: All systems reviewed and negative except as per HPI.   Current Outpatient Medications  Medication Sig Dispense Refill   acetaminophen (TYLENOL) 500 MG tablet Take 1,000 mg by mouth every 6 (six) hours as needed for headache (pain).     aspirin 81 MG tablet Take 1 tablet (81 mg total) daily by mouth.     butalbital-acetaminophen-caffeine (FIORICET, ESGIC) 50-325-40 MG tablet Take 1 tablet by mouth every 12 (twelve) hours as needed for headache. 14 tablet 0   carvedilol (COREG)  3.125 MG tablet Take 1 tablet (3.125 mg total) by mouth 2 (two) times daily with a meal. 90 tablet 3   clopidogrel (PLAVIX) 75 MG tablet Take 1 tablet (75 mg total) by mouth daily. 30 tablet 1   empagliflozin (JARDIANCE) 10 MG TABS tablet Take 1 tablet (10 mg total) by mouth daily before breakfast. 30 tablet 11   ezetimibe (ZETIA) 10 MG tablet Take 1 tablet (10 mg total) by mouth daily. 30 tablet 3   feeding supplement, ENSURE ENLIVE, (ENSURE ENLIVE) LIQD Take 237 mLs by mouth 2 (two) times daily between meals. 237 mL 0   furosemide (LASIX) 20 MG tablet Take 2 tablets (40 mg total) by mouth in the morning AND 1 tablet (20 mg total) every evening. 90 tablet 3   gabapentin (NEURONTIN) 300 MG capsule Take 1 capsule (300 mg total) by mouth daily.     Hypromellose (ARTIFICIAL TEARS OP) Place 1 drop into both eyes daily as needed (dry eyes).     levothyroxine (SYNTHROID, LEVOTHROID) 25 MCG tablet Take 25 mcg by mouth daily before breakfast.     potassium chloride SA (KLOR-CON) 20 MEQ tablet Take 1 tablet (20 mEq total) by mouth daily. 60 tablet 0   Tafamidis  (VYNDAMAX) 61 MG CAPS TAKE 1 CAPSULE BY MOUTH ONCE A DAY 30 capsule 3   No current facility-administered medications for this encounter.   BP 126/70   Pulse 80   Wt 69.9 kg (154 lb)   LMP 02/12/2013   SpO2 98%   BMI 27.28 kg/m  General: NAD Neck: No JVD, no thyromegaly or thyroid nodule.  Lungs: Clear to auscultation bilaterally with normal respiratory effort. CV: Nondisplaced PMI.  Heart regular S1/S2, no S3/S4, no murmur.  No peripheral edema.  No carotid bruit.  Normal pedal pulses.  Abdomen: Soft, nontender, no hepatosplenomegaly, no distention.  Skin: Intact without lesions or rashes.  Neurologic: Alert and oriented x 3.  Psych: Normal affect. Extremities: No clubbing or cyanosis.  HEENT: Normal.   Assessment/Plan: 1. Chronic diastolic CHF: Normal EF on 10/18 echo with severe LVH and speckled myocardium consistent with cardiac amyloidosis.  SPEP was negative. 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 Cardiac MRI also strongly suggestive of cardiac amyloidosis. NYHA class II.  She is not volume overloaded by exam or Cardiomems.  - Continue Lasix 40 qam/20 qpm, BMET today.    - Continue carvedilol 3.125 mg BID. - With diastolic CHF, will add Jardiance 10 mg daily, BMET 10 days.   2. Mitral regurgitation: Mitral regurgitation seems to fluctuate somewhat based on loading conditions.  Most recently, echo in 2/19 suggested severe MR and cardiac MRI suggested moderate to severe MR (moderate by 41% regurgitant fraction but visually severe). I suspect that the etiology is mixed - functional as well as degenerative with thickening and restriction of posterior leaflet. She is now s/p Mitraclip procedure in 5/19. Echo in 11/21 showed mean gradient 5 mmHg across the MV with Mitraclip in place, MR is mild.  3. CKD: Stage III.  BMET today.  4. CVA: 7/18. Has LINQ monitor, no atrial  fibrillation detected so far.  - Continue ASA 81.   - Continue Plavix. 5. 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  Symptoms are worsening.  Neuropathy is FAP 1 and PND 1.  - See below, trying to get her started on patisiran.  6. Cardiac amyloidosis: Hereditary  TTR amyloidosis.  She has peripheral neuropathy as well as bilateral carpal tunnel syndrome.   - She is now on tafamidis, no side effects.   - Given symptomatic peripheral neuropathy (FAP 1 and PND 1), would like her to start on patisiran.  We are working on getting this.  7. Hyperlipidemia: Goal LDL < 70 with CVA.  LDL 142 in 6/22 on Zetia.  Cannot take statins due to rhabdomyolysis with Crestor (interaction with tafamidis).   - Refer to lipid clinic for [place].   Followup in 3 months.   Loralie Champagne,  03/02/2021 3:56 PM

## 2021-03-02 NOTE — Patient Instructions (Signed)
Labs done today. We will contact you only if your labs are abnormal.  START Jardiance 10mg  (1 tablet) by mouth daily.   No other medication changes were made. Please continue all current medications as prescribed.  Your physician recommends that you schedule a follow-up appointment in: 10 days for a lab only appointment and in 3 months with our APP Clinic here in our office.   If you have any questions or concerns before your next appointment please send [place] a message through [place] or call our office at [phone].    TO LEAVE A MESSAGE FOR THE NURSE SELECT OPTION 2, PLEASE LEAVE A MESSAGE INCLUDING: YOUR NAME DATE OF BIRTH CALL BACK NUMBER REASON FOR CALL**this is important as we prioritize the call backs  YOU WILL RECEIVE A CALL BACK THE SAME DAY AS LONG AS YOU CALL BEFORE 4:00 PM   Do the following things EVERYDAY: Weigh yourself in the morning before breakfast. Write it down and keep it in a log. Take your medicines as prescribed Eat low salt foods--Limit salt (sodium) to 2000 mg per day.  Stay as active as you can everyday Limit all fluids for the day to less than 2 liters   At the [facility], you and your health needs are our priority. As part of our continuing mission to provide you with exceptional heart care, we have created designated Provider Care Teams. These Care Teams include your primary Cardiologist (physician) and Advanced Practice Providers (APPs- Physician Assistants and Nurse Practitioners) who all work together to provide you with the care you need, when you need it.   You may see any of the following providers on your designated Care Team at your next follow up: Dr Glori Bickers Dr Haynes Kerns, NP Lyda Jester, [place] Audry Riles, PharmD   Please be sure to bring in all your medications bottles to every appointment.

## 2021-03-02 NOTE — Telephone Encounter (Addendum)
Advanced Heart Failure Patient Advocate Encounter  Reached out to [place] assist for update on Onp[facility] referral. The patient is currently being assessed for patient assistance. She provided them with a Medicaid supplement. Right now a benefits investigation is being conducted to see what portion if any, that is covered under that benefit.   Called and updated the patient.   [name], CPhT

## 2021-03-03 ENCOUNTER — Ambulatory Visit: Payer: Medicare Other

## 2021-03-03 DIAGNOSIS — R0602 Shortness of breath: Secondary | ICD-10-CM | POA: Insufficient documentation

## 2021-03-03 DIAGNOSIS — K5730 Diverticulosis of large intestine without perforation or abscess without bleeding: Secondary | ICD-10-CM | POA: Insufficient documentation

## 2021-03-03 DIAGNOSIS — Z800 Family history of malignant neoplasm of digestive organs: Secondary | ICD-10-CM | POA: Insufficient documentation

## 2021-03-03 DIAGNOSIS — I5020 Unspecified systolic (congestive) heart failure: Secondary | ICD-10-CM | POA: Insufficient documentation

## 2021-03-03 NOTE — Progress Notes (Deleted)
Patient ID: XITLALIC MASLIN                 DOB: 10-Aug-1945                    MRN: [id number]     HPI: Katie [name] is a 75 y.o. female patient referred to lipid clinic by Dr [name]. PMH is significant for CHF, mitral regurgitation, CKD, CVA, cardiac amloidosis, and HLD.    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 s/p right nephrectomy ~2007.   History of loop recorder    Hypercholesterolemia    Hypertension    Hypothyroidism    Mitral regurgitation    Pulmonary hypertension (HCC)    Stroke ([place])    no residual    Current Outpatient Medications on File Prior to Visit  Medication Sig Dispense Refill   acetaminophen (TYLENOL) 500 MG tablet Take 1,000 mg by mouth every 6 (six) hours as needed for headache (pain).     aspirin 81 MG tablet Take 1 tablet (81 mg total) daily by mouth.     butalbital-acetaminophen-caffeine (FIORICET, ESGIC) 50-325-40 MG tablet Take 1 tablet by mouth every 12 (twelve) hours as needed for headache. 14 tablet 0   carvedilol (COREG) 3.125 MG tablet Take 1 tablet (3.125 mg total) by mouth 2 (two) times daily with a meal. 90 tablet 3   clopidogrel (PLAVIX) 75 MG tablet Take 1 tablet (75 mg total) by mouth daily. 30 tablet 1   empagliflozin (JARDIANCE) 10 MG TABS tablet Take 1 tablet (10 mg total) by mouth daily before breakfast. 30 tablet 11   ezetimibe (ZETIA) 10 MG tablet Take 1 tablet (10 mg total)  by mouth daily. 30 tablet 3   feeding supplement, ENSURE ENLIVE, (ENSURE ENLIVE) LIQD Take 237 mLs by mouth 2 (two) times daily between meals. 237 mL 0   furosemide (LASIX) 20 MG tablet Take 2 tablets (40 mg total) by mouth in the morning AND 1 tablet (20 mg total) every evening. 90 tablet 3   gabapentin (NEURONTIN) 300 MG capsule Take 1 capsule (300 mg total) by mouth daily.     Hypromellose (ARTIFICIAL TEARS OP) Place 1 drop into both eyes daily as needed (dry eyes).     levothyroxine (SYNTHROID, LEVOTHROID) 25 MCG tablet Take 25 mcg by mouth daily before breakfast.     potassium chloride SA (KLOR-CON) 20 MEQ tablet Take 1 tablet (20 mEq total) by mouth daily. 60 tablet 0   Tafamidis (VYNDAMAX) 61 MG CAPS TAKE 1 CAPSULE BY MOUTH ONCE A DAY 30 capsule 3   No current facility-administered medications on file prior to visit.    Allergies  Allergen Reactions   Crestor [Rosuvastatin] Other (See Comments)    Drug interaction w/ tafamidis, caused rhabdomyolysis   Tramadol Shortness Of Breath and Palpitations  Assessment/Plan:  1. Hyperlipidemia -

## 2021-03-05 DIAGNOSIS — N183 Chronic kidney disease, stage 3 unspecified: Secondary | ICD-10-CM | POA: Diagnosis not present

## 2021-03-05 DIAGNOSIS — E7800 Pure hypercholesterolemia, unspecified: Secondary | ICD-10-CM | POA: Diagnosis not present

## 2021-03-05 DIAGNOSIS — I509 Heart failure, unspecified: Secondary | ICD-10-CM | POA: Diagnosis not present

## 2021-03-05 DIAGNOSIS — E782 Mixed hyperlipidemia: Secondary | ICD-10-CM | POA: Diagnosis not present

## 2021-03-05 DIAGNOSIS — I272 Pulmonary hypertension, unspecified: Secondary | ICD-10-CM | POA: Diagnosis not present

## 2021-03-05 DIAGNOSIS — I10 Essential (primary) hypertension: Secondary | ICD-10-CM | POA: Diagnosis not present

## 2021-03-05 DIAGNOSIS — I5043 Acute on chronic combined systolic (congestive) and diastolic (congestive) heart failure: Secondary | ICD-10-CM | POA: Diagnosis not present

## 2021-03-05 DIAGNOSIS — H35033 Hypertensive retinopathy, bilateral: Secondary | ICD-10-CM | POA: Diagnosis not present

## 2021-03-05 DIAGNOSIS — C649 Malignant neoplasm of unspecified kidney, except renal pelvis: Secondary | ICD-10-CM | POA: Diagnosis not present

## 2021-03-05 DIAGNOSIS — I639 Cerebral infarction, unspecified: Secondary | ICD-10-CM | POA: Diagnosis not present

## 2021-03-05 DIAGNOSIS — D0510 Intraductal carcinoma in situ of unspecified breast: Secondary | ICD-10-CM | POA: Diagnosis not present

## 2021-03-05 DIAGNOSIS — E039 Hypothyroidism, unspecified: Secondary | ICD-10-CM | POA: Diagnosis not present

## 2021-03-09 ENCOUNTER — Other Ambulatory Visit (HOSPITAL_COMMUNITY): Payer: Self-pay | Admitting: Cardiology

## 2021-03-09 DIAGNOSIS — L539 Erythematous condition, unspecified: Secondary | ICD-10-CM | POA: Diagnosis not present

## 2021-03-11 ENCOUNTER — Other Ambulatory Visit (HOSPITAL_COMMUNITY): Payer: Self-pay

## 2021-03-12 ENCOUNTER — Other Ambulatory Visit (HOSPITAL_COMMUNITY): Payer: Self-pay

## 2021-03-12 ENCOUNTER — Other Ambulatory Visit: Payer: Self-pay

## 2021-03-12 ENCOUNTER — Ambulatory Visit (HOSPITAL_COMMUNITY)
Admission: RE | Admit: 2021-03-12 | Discharge: 2021-03-12 | Disposition: A | Discharge status: Home / Self Care | Payer: Medicare Other | Source: Ambulatory Visit | Attending: Internal Medicine | Admitting: Internal Medicine

## 2021-03-12 ENCOUNTER — Telehealth: Payer: Self-pay

## 2021-03-12 DIAGNOSIS — I5032 Chronic diastolic (congestive) heart failure: Secondary | ICD-10-CM | POA: Insufficient documentation

## 2021-03-12 DIAGNOSIS — E851 Neuropathic heredofamilial amyloidosis: Secondary | ICD-10-CM | POA: Diagnosis not present

## 2021-03-12 LAB — BASIC METABOLIC PANEL
Anion gap: 9 (ref 5–15)
BUN: 33 mg/dL — ABNORMAL HIGH (ref 8–23)
CO2: 27 mmol/L (ref 22–32)
Calcium: 9.9 mg/dL (ref 8.9–10.3)
Chloride: 100 mmol/L (ref 98–111)
Creatinine, Ser: 1.75 mg/dL — ABNORMAL HIGH (ref 0.44–1.00)
GFR, Estimated: 30 mL/min — ABNORMAL LOW (ref >60)
Glucose, Bld: 90 mg/dL (ref 70–99)
Potassium: 4.1 mmol/L (ref 3.5–5.1)
Sodium: 136 mmol/L (ref 135–145)

## 2021-03-12 NOTE — Telephone Encounter (Signed)
Called and spoke w/pt to R/s lipid pharmd appt and they declined and stated that they will call us back at their earliest convenience

## 2021-03-18 ENCOUNTER — Ambulatory Visit: Payer: Medicare Other

## 2021-03-20 ENCOUNTER — Other Ambulatory Visit (HOSPITAL_COMMUNITY): Payer: Self-pay

## 2021-03-24 ENCOUNTER — Other Ambulatory Visit (HOSPITAL_COMMUNITY): Payer: Self-pay

## 2021-03-24 ENCOUNTER — Other Ambulatory Visit (HOSPITAL_COMMUNITY): Payer: Self-pay | Admitting: Cardiology

## 2021-03-25 ENCOUNTER — Other Ambulatory Visit (HOSPITAL_COMMUNITY): Payer: Self-pay | Admitting: Cardiology

## 2021-03-25 ENCOUNTER — Other Ambulatory Visit (HOSPITAL_COMMUNITY): Payer: Self-pay

## 2021-03-26 ENCOUNTER — Other Ambulatory Visit (HOSPITAL_COMMUNITY): Payer: Self-pay

## 2021-03-27 ENCOUNTER — Other Ambulatory Visit (HOSPITAL_COMMUNITY): Payer: Self-pay

## 2021-03-27 ENCOUNTER — Other Ambulatory Visit (HOSPITAL_COMMUNITY): Payer: Self-pay | Admitting: Cardiology

## 2021-03-30 ENCOUNTER — Other Ambulatory Visit (HOSPITAL_COMMUNITY): Payer: Self-pay

## 2021-03-30 MED ORDER — EMPAGLIFLOZIN 10 MG PO TABS
10.0000 mg | ORAL_TABLET | Freq: Every day | ORAL | 11 refills | Status: DC
  Filled 2021-03-30: qty 30, 30d supply, fill #0
  Filled 2021-05-05: qty 30, 30d supply, fill #1
  Filled 2021-06-03: qty 30, 30d supply, fill #2
  Filled 2021-07-09: qty 30, 30d supply, fill #3
  Filled 2021-08-06: qty 30, 30d supply, fill #4
  Filled 2021-09-02: qty 30, 30d supply, fill #5
  Filled 2021-10-05: qty 30, 30d supply, fill #6
  Filled 2021-11-05: qty 30, 30d supply, fill #7
  Filled 2021-11-30: qty 30, 30d supply, fill #8
  Filled 2021-12-31: qty 30, 30d supply, fill #9

## 2021-03-31 ENCOUNTER — Other Ambulatory Visit (HOSPITAL_COMMUNITY): Payer: Self-pay

## 2021-04-01 ENCOUNTER — Telehealth: Payer: Self-pay | Admitting: Pharmacist Clinician (PhC)/ Clinical Pharmacy Specialist

## 2021-04-01 ENCOUNTER — Other Ambulatory Visit (HOSPITAL_COMMUNITY): Payer: Self-pay

## 2021-04-01 ENCOUNTER — Ambulatory Visit (INDEPENDENT_AMBULATORY_CARE_PROVIDER_SITE_OTHER): Payer: Medicare Other | Admitting: Pharmacist Clinician (PhC)/ Clinical Pharmacy Specialist

## 2021-04-01 ENCOUNTER — Other Ambulatory Visit: Payer: Self-pay

## 2021-04-01 DIAGNOSIS — E785 Hyperlipidemia, unspecified: Secondary | ICD-10-CM

## 2021-04-01 MED ORDER — REPATHA SURECLICK 140 MG/ML ~~LOC~~ SOAJ
140.0000 mg | SUBCUTANEOUS | 11 refills | Status: DC
Start: 2021-04-01 — End: 2022-03-17

## 2021-04-01 NOTE — Telephone Encounter (Signed)
Called and spoke w/pt and stated that the repaa was approved, rx sent, pt instructed to complete fasting labs post 4th dose and to call us back if unaffordabe and the pt voiced understanding

## 2021-04-01 NOTE — Assessment & Plan Note (Signed)
Patient with history of CVA and elevated cholesterol.  Cannot tolerate statin drugs due to elevated CK levels.  Reviewed information regarding PCSK-9 inhibitors.  Discussed mechanisms of action, dosing, side effects and potential decreases in LDL cholesterol.  Also reviewed cost information and potential options for patient assistance.  Answered all patient questions.  Will start Repatha 140 mg q14 days.  She was given AVS with the next 4 dates for doses and we will have her repeat lipid labs at her appointment in the HF clinic on November 1.

## 2021-04-01 NOTE — Telephone Encounter (Signed)
Katie [name]  - please start PA for Repatha and have patient repeat labs when sees Dr. [name] on Nov 1.  She has trouble with transportation and wonders if [facility] can mail it to her.

## 2021-04-01 NOTE — Patient Instructions (Signed)
Your Results:             Your most recent labs Goal  Total Cholesterol 216 < 200  Triglycerides 73 < 150  HDL (happy/good cholesterol) 59 > 40  LDL (lousy/bad cholesterol 142 < 70      Medication changes:  Take Repatha 140 mg every other Wednesday.     Sept 14   Sept 28   Oct 12   Oct 26  Lab orders:  Repeat cholesterol labs when you go to the HF clinic on November 1  Thank you for coming to [facility]

## 2021-04-01 NOTE — Progress Notes (Signed)
04/01/2021 Katie [name] February 04, 1946 [id number]   HPI:  Katie [name] is a 75 y.o. female patient of Dr [name], who presents today for a lipid clinic evaluation.  See pertinent past medical history below.  She was hospitalized last November because of lightheadedness, general malaise and elevated creatinine.  AST and ALT were 697 and 335 respectively, and creatine up to 3.84.  CK was drawn and found to be 29,154.  It was assumed that this elevation was due in part to an interaction with rosuvastatin and tafamidis.  Rosuvastatin was discontinued and because of the severity of rhabdomyolysis, would not recommend any further statin trials.  She was also noted to have a cerebellar infarct back in 2018.  She has not had any residual problems from this.    Past Medical History: CVA July 2018 - acute infarct in left superior cerebellum, possible punctate acute infarct in right cerebellum; On ASA, plavix  hypertension Controlled on carvedilol  CHF Initially grade 3 diastolic dysfunction, lEF has fluctuated between 40-65% over past several years, most recently at 50-55%; recently diagnosed with cardiac amyloidosis, now on tafamidil  CKD SCr at 1.75, GFR 30  hypothyroidism TSH 2.9 (2019), on levothyroxine 25 mcg    Current Medications: ezetimibe 10 mg qd  Cholesterol Goals: LDL < 70   Intolerant/previously tried: rosuvastatin caused elevation in CK to 2627  Diet: eats mostly home cooked foods.  Usually sandwiches, although daughter will bring her a plate of food at times.   Exercise:  none,   Labs: 6/22:  TC 216, TG 73, HDL 59, LDL 142   Current Outpatient Medications  Medication Sig Dispense Refill   acetaminophen (TYLENOL) 500 MG tablet Take 1,000 mg by mouth every 6 (six) hours as needed for headache (pain).     aspirin 81 MG tablet Take 1 tablet (81 mg total) daily by mouth.     butalbital-acetaminophen-caffeine (FIORICET, ESGIC) 50-325-40 MG tablet Take 1 tablet by mouth every 12 (twelve)  hours as needed for headache. 14 tablet 0   carvedilol (COREG) 3.125 MG tablet Take 1 tablet (3.125 mg total) by mouth 2 (two) times daily with a meal. 90 tablet 3   clopidogrel (PLAVIX) 75 MG tablet Take 1 tablet (75 mg total) by mouth daily. 30 tablet 1   empagliflozin (JARDIANCE) 10 MG TABS tablet Take 1 tablet (10 mg total) by mouth daily before breakfast. 30 tablet 11   EPINEPHrine 0.3 mg/0.3 mL IJ SOAJ injection Inject 1 mg into the muscle as needed.     ezetimibe (ZETIA) 10 MG tablet TAKE 1 TABLET(10 MG) BY MOUTH DAILY 30 tablet 3   feeding supplement, ENSURE ENLIVE, (ENSURE ENLIVE) LIQD Take 237 mLs by mouth 2 (two) times daily between meals. 237 mL 0   furosemide (LASIX) 20 MG tablet TAKE 2 TABLETS BY MOUTH IN THE MORNING AND THEN 1 TABLET EVERY EVENING 90 tablet 3   gabapentin (NEURONTIN) 300 MG capsule Take 1 capsule (300 mg total) by mouth daily.     Hypromellose (ARTIFICIAL TEARS OP) Place 1 drop into both eyes daily as needed (dry eyes).     levothyroxine (SYNTHROID, LEVOTHROID) 25 MCG tablet Take 25 mcg by mouth daily before breakfast.     potassium chloride SA (KLOR-CON) 20 MEQ tablet Take 1 tablet (20 mEq total) by mouth daily. 60 tablet 0   Tafamidis (VYNDAMAX) 61 MG CAPS TAKE 1 CAPSULE BY MOUTH ONCE A DAY 30 capsule 3   Na Sulfate-K Sulfate-Mg Sulf 17.5-3.13-1.6 GM/177ML SOLN  See admin instructions. (Patient not taking: Reported on 04/01/2021)     No current facility-administered medications for this visit.    Allergies  Allergen Reactions   Crestor [Rosuvastatin] Other (See Comments)    Drug interaction w/ tafamidis, caused rhabdomyolysis   Tramadol Shortness Of Breath and Palpitations    Past Medical History:  Diagnosis Date   Amyloid heart disease (HCC)    Cancer ([place])    kidney cancer   Chronic combined systolic and diastolic CHF (congestive heart failure) (HCC)    CKD (chronic kidney disease), stage III ([place])    removal of right kidney due to carcinoma    Dyspnea    Headache    History of kidney cancer    a. s/p right nephrectomy ~2007.   History of loop recorder    Hypercholesterolemia    Hypertension    Hypothyroidism    Mitral regurgitation    Pulmonary hypertension (HCC)    Stroke ([place])    no residual    Blood pressure 136/78, pulse 87, resp. rate 16, height 5\' 4"  (1.626 m), weight 146 lb 8 oz (66.5 kg), last menstrual period 02/12/2013, SpO2 99 %.   Hyperlipidemia Patient with history of CVA and elevated cholesterol.  Cannot tolerate statin drugs due to elevated CK levels.  Reviewed information regarding PCSK-9 inhibitors.  Discussed mechanisms of action, dosing, side effects and potential decreases in LDL cholesterol.  Also reviewed cost information and potential options for patient assistance.  Answered all patient questions.  Will start Repatha 140 mg q14 days.  She was given AVS with the next 4 dates for doses and we will have her repeat lipid labs at her appointment in the HF clinic on November 1.     Tommy Medal PharmD CPP [facility] [address] [place], [place] [address] [phone]

## 2021-04-02 DIAGNOSIS — I129 Hypertensive chronic kidney disease with stage 1 through stage 4 chronic kidney disease, or unspecified chronic kidney disease: Secondary | ICD-10-CM | POA: Diagnosis not present

## 2021-04-02 DIAGNOSIS — I340 Nonrheumatic mitral (valve) insufficiency: Secondary | ICD-10-CM | POA: Diagnosis not present

## 2021-04-02 DIAGNOSIS — N184 Chronic kidney disease, stage 4 (severe): Secondary | ICD-10-CM | POA: Diagnosis not present

## 2021-04-02 DIAGNOSIS — I5032 Chronic diastolic (congestive) heart failure: Secondary | ICD-10-CM | POA: Diagnosis not present

## 2021-04-02 DIAGNOSIS — E785 Hyperlipidemia, unspecified: Secondary | ICD-10-CM | POA: Diagnosis not present

## 2021-04-02 DIAGNOSIS — I43 Cardiomyopathy in diseases classified elsewhere: Secondary | ICD-10-CM | POA: Diagnosis not present

## 2021-04-02 DIAGNOSIS — E851 Neuropathic heredofamilial amyloidosis: Secondary | ICD-10-CM | POA: Diagnosis not present

## 2021-04-02 DIAGNOSIS — E854 Organ-limited amyloidosis: Secondary | ICD-10-CM | POA: Diagnosis not present

## 2021-04-09 ENCOUNTER — Other Ambulatory Visit (HOSPITAL_COMMUNITY): Payer: Self-pay

## 2021-04-23 DIAGNOSIS — E851 Neuropathic heredofamilial amyloidosis: Secondary | ICD-10-CM | POA: Diagnosis not present

## 2021-04-27 ENCOUNTER — Other Ambulatory Visit (HOSPITAL_COMMUNITY): Payer: Self-pay

## 2021-05-05 ENCOUNTER — Other Ambulatory Visit (HOSPITAL_COMMUNITY): Payer: Self-pay

## 2021-05-11 ENCOUNTER — Other Ambulatory Visit (HOSPITAL_COMMUNITY): Payer: Self-pay

## 2021-05-14 DIAGNOSIS — N1832 Chronic kidney disease, stage 3b: Secondary | ICD-10-CM | POA: Diagnosis not present

## 2021-05-14 DIAGNOSIS — I272 Pulmonary hypertension, unspecified: Secondary | ICD-10-CM | POA: Diagnosis not present

## 2021-05-14 DIAGNOSIS — H35033 Hypertensive retinopathy, bilateral: Secondary | ICD-10-CM | POA: Diagnosis not present

## 2021-05-14 DIAGNOSIS — E782 Mixed hyperlipidemia: Secondary | ICD-10-CM | POA: Diagnosis not present

## 2021-05-14 DIAGNOSIS — E7800 Pure hypercholesterolemia, unspecified: Secondary | ICD-10-CM | POA: Diagnosis not present

## 2021-05-14 DIAGNOSIS — M1990 Unspecified osteoarthritis, unspecified site: Secondary | ICD-10-CM | POA: Diagnosis not present

## 2021-05-14 DIAGNOSIS — E039 Hypothyroidism, unspecified: Secondary | ICD-10-CM | POA: Diagnosis not present

## 2021-05-14 DIAGNOSIS — I5043 Acute on chronic combined systolic (congestive) and diastolic (congestive) heart failure: Secondary | ICD-10-CM | POA: Diagnosis not present

## 2021-05-14 DIAGNOSIS — I10 Essential (primary) hypertension: Secondary | ICD-10-CM | POA: Diagnosis not present

## 2021-05-14 DIAGNOSIS — E851 Neuropathic heredofamilial amyloidosis: Secondary | ICD-10-CM | POA: Diagnosis not present

## 2021-05-15 ENCOUNTER — Ambulatory Visit
Admission: RE | Admit: 2021-05-15 | Discharge: 2021-05-15 | Disposition: A | Discharge status: Home / Self Care | Payer: Medicare Other | Source: Ambulatory Visit | Attending: Internal Medicine | Admitting: Internal Medicine

## 2021-05-15 ENCOUNTER — Other Ambulatory Visit: Payer: Self-pay | Admitting: Internal Medicine

## 2021-05-15 DIAGNOSIS — I5042 Chronic combined systolic (congestive) and diastolic (congestive) heart failure: Secondary | ICD-10-CM | POA: Diagnosis not present

## 2021-05-15 DIAGNOSIS — Z23 Encounter for immunization: Secondary | ICD-10-CM | POA: Diagnosis not present

## 2021-05-15 DIAGNOSIS — H401131 Primary open-angle glaucoma, bilateral, mild stage: Secondary | ICD-10-CM | POA: Diagnosis not present

## 2021-05-15 DIAGNOSIS — E8582 Wild-type transthyretin-related (ATTR) amyloidosis: Secondary | ICD-10-CM | POA: Diagnosis not present

## 2021-05-15 DIAGNOSIS — M25559 Pain in unspecified hip: Secondary | ICD-10-CM

## 2021-05-15 DIAGNOSIS — I10 Essential (primary) hypertension: Secondary | ICD-10-CM | POA: Diagnosis not present

## 2021-05-15 DIAGNOSIS — E7800 Pure hypercholesterolemia, unspecified: Secondary | ICD-10-CM | POA: Diagnosis not present

## 2021-05-15 DIAGNOSIS — Z0000 Encounter for general adult medical examination without abnormal findings: Secondary | ICD-10-CM | POA: Diagnosis not present

## 2021-05-15 DIAGNOSIS — E041 Nontoxic single thyroid nodule: Secondary | ICD-10-CM | POA: Diagnosis not present

## 2021-05-15 DIAGNOSIS — Z85528 Personal history of other malignant neoplasm of kidney: Secondary | ICD-10-CM | POA: Diagnosis not present

## 2021-05-15 DIAGNOSIS — M25552 Pain in left hip: Secondary | ICD-10-CM | POA: Diagnosis not present

## 2021-05-15 DIAGNOSIS — N1832 Chronic kidney disease, stage 3b: Secondary | ICD-10-CM | POA: Diagnosis not present

## 2021-05-15 DIAGNOSIS — E039 Hypothyroidism, unspecified: Secondary | ICD-10-CM | POA: Diagnosis not present

## 2021-05-15 DIAGNOSIS — Z1389 Encounter for screening for other disorder: Secondary | ICD-10-CM | POA: Diagnosis not present

## 2021-05-18 ENCOUNTER — Other Ambulatory Visit (HOSPITAL_COMMUNITY): Payer: Self-pay

## 2021-05-19 DIAGNOSIS — E851 Neuropathic heredofamilial amyloidosis: Secondary | ICD-10-CM | POA: Diagnosis not present

## 2021-05-22 DIAGNOSIS — M1612 Unilateral primary osteoarthritis, left hip: Secondary | ICD-10-CM | POA: Diagnosis not present

## 2021-05-22 DIAGNOSIS — M1611 Unilateral primary osteoarthritis, right hip: Secondary | ICD-10-CM | POA: Diagnosis not present

## 2021-05-25 ENCOUNTER — Other Ambulatory Visit (HOSPITAL_COMMUNITY): Payer: Self-pay

## 2021-05-25 MED ORDER — CARVEDILOL 3.125 MG PO TABS: 3.1250 mg | ORAL_TABLET | Freq: Two times a day (BID) | ORAL | 3 refills | Status: DC

## 2021-05-26 DIAGNOSIS — M25551 Pain in right hip: Secondary | ICD-10-CM | POA: Diagnosis not present

## 2021-05-26 DIAGNOSIS — M25552 Pain in left hip: Secondary | ICD-10-CM | POA: Diagnosis not present

## 2021-06-01 NOTE — Progress Notes (Signed)
PCP: Dr. [name] HF Cardiology: Dr. [name]  75 y.o. with history of chronic diastolic CHF, mitral regurgitation, CVA, TTR amyloid, and CKD .   Patient was initially admitted with diastolic CHF in 1/24. Echo showed EF 60-65% with grade 3 diastolic dysfunction and moderate MR.  She had cath showing no coronary disease. 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  TEE this admission showed moderate-severe MR.  She was admitted again in 10/18 with acute on chronic diastolic CHF. Echo this admission showed EF 50-55%, severe LVH, restrictive diastolic dysfunction, severe MR, moderate-severe TR.  She was referred to Dr. [name] who saw her for mitral valve evaluation.  Upon review of studies at that time, regurgitation thought to be more in the moderate range and likely functional.   Patient had PYP amyloid scan in 12/18, this was suggestive of transthyretin amyloidosis.  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   Admission for A/C diastolic HF 5/8-0/99/8338. Required IV diuresis, then transitioned to 80 mg PO lasix BID. Metop was DC'd due to low blood pressures. Cardiac MRI strongly suggestive of amyloidosis, moderate to severe MR (moderate by regurgitant fraction 41% but severe visually). Echo repeated: EF 40-45%, moderate to severe LVH, severe MR, severe dilation of LA and RA, moderate TR, PASP 49.  She had Mitraclip x 2 placed in 5/19.  Repeat echo post-mitraclip showed EF 55-60%, moderate LVH, mean gradient 3 mmHg across the MV with trace-mild MR.   Cardiomems was placed 6/19.    Echo 6/20 showed EF 55% with moderate LVH, s/p Mitraclip with mean gradient 3 mmHg, mild MR, normal RV, PASP 41 mmHg.   Echo in 11/21 showed EF 50-55%, moderate LVH, normal RV, PASP 44 mmHg, s/p Mitraclip with mean gradient 5 mmHg with mild MR.   Today she returns for HF follow up.Overall feeling ok. Limited by bilateral hip pain. Saw ortho and she did not want to  pursue replacement. Says she rarely leaves the house due to joint pain. Mild SOB with exertion. Denies PND/Orthopnea. Denies dizziness/syncope. Appetite ok. No fever or chills. Weight at home  has been stable. Taking all medications. Receiving Onpattro infusions every 3 weeks. Next infusion on 06/09/21.   Cardiomems: Todays reading is 19 (goal 19)  Labs (10/18): K 4.4, creatinine 1.27, LDL 56, HDL 39 Labs (11/18): K 3.9, creatinine 1.43, SPEP negative Labs (2/19): K 3.8, creatinine 1.40, hgb 13.4 Labs (5/19): K 4.1, creatinine 1.4, hgb 10.8 Labs (6/19): K 4.2, creatinine 1.4, hgb 12.4 Labs (7/19): K 4, creatinine 1.38 Labs (1/20): K 3.6, creatinine 1.36 Labs (3/20): K 3.6, creatinine 1.31, LDL 107 Labs (7/20): creatinine 1.48 Labs (10/20): LDL 87 Labs (12/21): LDL 127, BNP 485 Labs (1/22): K 4.6, creatinine 1.65 Labs (6/22): K 4.1, creatinine 1.67, LDL 142 Labs 03/12/21: K 4/1 Creatinine 1.75   PMH: 1. HTN 2. Hyperlipidemia 3. 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  LINQ monitor placed.  4. Chronic diastolic CHF: She has Cardiomems.  - LHC (6/17): Normal coronaries.  - Echo (10/18): EF 50-55%, severe LVH with speckled myocardium concerning for amyloidosis, restrictive diastolic function with septal flattening, severe MR, moderate-severe TR, severe biatrial enlargement, PASP 54 mmHg.  - PYP amyloid scan: Grade 3, suggestive of transthyretin amyloidosis.  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   - Echo (5/19): EF 55-60%, moderate LVH c/w amyloidosis, Mitraclip x 2 with mean  MV gradient 3 mmHg, trace-mild MR, moderate TR, PASP 62 mmHg.  - Echo (6/19): EF 55-60%, moderate LVH with speckled myocardium, trivial MR s/p Mitraclip with mean gradient 2 mmHg, PASP 61.  - Echo (6/20): EF 55% with moderate LVH, s/p Mitraclip with mean gradient 3 mmHg, mild MR, normal RV, PASP 41 mmHg.  - Echo (11/21): EF 50-55%, moderate LVH, normal RV, PASP 44 mmHg, s/p Mitraclip with mean gradient 5 mmHg with mild MR. 5. Mitral regurgitation: Think mixed functional/degenerative - TEE (7/18): EF 45-50%, moderate-severe MR, mild-moderate TR.  - Echo (10/18) read as severe MR.  - 2/19 echo with severe MR.  - 2/19 cardiac MRI with moderate to severe MR.  - Mitraclip x 2 in 5/19.   - Echo 5/19 post-Mitraclip with mean MV gradient 3 mmHg, trace-mild MR.  - Echo 6/20 post-Mitraclip with mean MV gradient 3 mmHg, mild MR.  - Echo 11/21 post-Mitraclip with mean MV gradient 5 mmHg, mild MR 6. CKD stage 3 7. Hypothyroidism 8. H/o renal cell CA s/p nephrectomy.  9. Bilateral carpal tunnel releases.  10. ABIs (6/20): Normal  Social History   Socioeconomic History   Marital status: Divorced    Spouse name: Not on file   Number of children: 5   Years of education: 12   Highest education level: Not on file  Occupational History   Occupation: retired    Fish farm manager: [employer]  Tobacco Use   Smoking status: Never   Smokeless tobacco: Never  Vaping Use   Vaping Use: Never used  Substance and Sexual Activity   Alcohol use: No   Drug use: No   Sexual activity: Not Currently  Other Topics Concern   Not on file  Social History Narrative   Lives alone in a one story home.  Her son stays with her some.  Has 5 children, 10 grandchildren and 10 great grandchildren.  Retired Training and development officer at [employer].  Education: high school.        07/14/18- list food insecurity but is aware of food pantries and gets $15 in food stamps, reports issues with medical bills but has insurance and gets extra help  through medicaid for premiums- encouraged pt to call billing to set up reasonable payment plan so it doesn't go to collections.      Left Handed   Social Determinants of Health   Financial Resource Strain: Not on file  Food Insecurity: Not on file  Transportation Needs: Not on file  Physical Activity: Not on file  Stress: Not on file  Social Connections: Not on file  Intimate Partner Violence: Not on file   Family History  Problem Relation Age of Onset   Stroke Brother    Diabetes Mellitus II Brother    Diabetes Mellitus II Sister    Breast cancer Sister    Colon cancer Sister    ROS: All systems reviewed and negative except as per HPI.   Current Outpatient Medications  Medication Sig Dispense Refill   acetaminophen (TYLENOL) 500 MG tablet Take 1,000 mg by mouth every 6 (six) hours as needed for headache (pain).     aspirin 81 MG tablet Take 1 tablet (81 mg total) daily by mouth.  butalbital-acetaminophen-caffeine (FIORICET, ESGIC) 50-325-40 MG tablet Take 1 tablet by mouth every 12 (twelve) hours as needed for headache. 14 tablet 0   carvedilol (COREG) 3.125 MG tablet Take 1 tablet (3.125 mg total) by mouth 2 (two) times daily with a meal. 120 tablet 3   clopidogrel (PLAVIX) 75 MG tablet Take 1 tablet (75 mg total) by mouth daily. 30 tablet 1   empagliflozin (JARDIANCE) 10 MG TABS tablet Take 1 tablet (10 mg total) by mouth daily before breakfast. 30 tablet 11   EPINEPHrine 0.3 mg/0.3 mL IJ SOAJ injection Inject 1 mg into the muscle as needed.     Evolocumab (REPATHA SURECLICK) 938 MG/ML SOAJ Inject 140 mg into the skin every 14 (fourteen) days. 2 mL 11   ezetimibe (ZETIA) 10 MG tablet TAKE 1 TABLET(10 MG) BY MOUTH DAILY 30 tablet 3   feeding supplement, ENSURE ENLIVE, (ENSURE ENLIVE) LIQD Take 237 mLs by mouth 2 (two) times daily between meals. 237 mL 0   furosemide (LASIX) 20 MG tablet TAKE 2 TABLETS BY MOUTH IN THE MORNING AND THEN 1 TABLET EVERY EVENING 90 tablet 3    gabapentin (NEURONTIN) 300 MG capsule Take 300 mg by mouth 2 (two) times daily.     Hypromellose (ARTIFICIAL TEARS OP) Place 1 drop into both eyes daily as needed (dry eyes).     levothyroxine (SYNTHROID, LEVOTHROID) 25 MCG tablet Take 25 mcg by mouth daily before breakfast.     potassium chloride SA (KLOR-CON) 20 MEQ tablet Take 1 tablet (20 mEq total) by mouth daily. 60 tablet 0   Tafamidis (VYNDAMAX) 61 MG CAPS TAKE 1 CAPSULE BY MOUTH ONCE A DAY 30 capsule 3   Na Sulfate-K Sulfate-Mg Sulf 17.5-3.13-1.6 GM/177ML SOLN See admin instructions. (Patient not taking: No sig reported)     No current facility-administered medications for this encounter.   BP 114/66   Pulse 88   Wt 70 kg (154 lb 6.4 oz)   LMP 02/12/2013   SpO2 100%   BMI 26.50 kg/m  Wt Readings from Last 3 Encounters:  06/02/21 70 kg (154 lb 6.4 oz)  04/01/21 66.5 kg (146 lb 8 oz)  03/02/21 69.9 kg (154 lb)   General:  Walked slowly in the clinic. No resp difficulty HEENT: normal Neck: supple. no JVD. Carotids 2+ bilat; no bruits. No lymphadenopathy or thryomegaly appreciated. Cor: PMI nondisplaced. Regular rate & rhythm. No rubs, gallops or murmurs. Lungs: clear Abdomen: soft, nontender, nondistended. No hepatosplenomegaly. No bruits or masses. Good bowel sounds. Extremities: no cyanosis, clubbing, rash, edema Neuro: alert & orientedx3, cranial nerves grossly intact. moves all 4 extremities w/o difficulty. Affect pleasant  Assessment/Plan: 1. Chronic diastolic CHF: Normal EF on 10/18 echo with severe LVH and speckled myocardium consistent with cardiac amyloidosis.  SPEP was negative. 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 Cardiac MRI also strongly suggestive of cardiac amyloidosis.  NYHA III. Volume status stable.  Cardiomems reading 19 (Goal 19). Continue Lasix 40 qam/20 qpm. Check BMET  - Continue carvedilol  3.125 mg BID. - Continue Jardiance 10 mg daily 2. Mitral regurgitation: Mitral regurgitation seems to fluctuate somewhat based on loading conditions.  Most recently, echo in 2/19 suggested severe MR and cardiac MRI suggested moderate to severe MR (moderate by 41% regurgitant fraction but visually severe). I suspect that the etiology is mixed - functional as well as degenerative with thickening and restriction of posterior leaflet. She is now s/p Mitraclip procedure in  5/19. Echo in 11/21 showed mean gradient 5 mmHg across the MV with Mitraclip in place, MR is mild.  3. CKD: Stage IIIa.   - Check BMET today.  4. CVA: 7/18. Has LINQ monitor, no atrial fibrillation detected so far.  - Continue ASA 81.   - Continue Plavix. 5. 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  Neuropathy is FAP 1 and PND 1.  - Stop Ompattro infusion once amvuttra approved.  6. Cardiac amyloidosis: Hereditary TTR amyloidosis.  She has peripheral neuropathy as well as bilateral carpal tunnel syndrome.   - She is now on tafamidis, no side effects.   - Stop Onpattrro infusions once amvuttra approved. She will then receive subcutaneous injections every 3 months. .  7. Hyperlipidemia: Goal LDL < 70 with CVA.  LDL 142 in 6/22 on Zetia.  Cannot take statins due to rhabdomyolysis with Crestor (interaction with tafamidis).   - On Repatha.  - Check lipid panel today.   Discussed plan for medication changes with Pharmacy Team. Application for Amvuttra signed today. Stop onpattro once approved.   Follow up in 3 months with Dr [name].     Darrick Grinder, NP-C  06/02/2021 9:10 AM

## 2021-06-02 ENCOUNTER — Other Ambulatory Visit: Payer: Self-pay

## 2021-06-02 ENCOUNTER — Ambulatory Visit (HOSPITAL_COMMUNITY)
Admission: RE | Admit: 2021-06-02 | Discharge: 2021-06-02 | Disposition: A | Discharge status: Home / Self Care | Payer: Medicare Other | Source: Ambulatory Visit | Attending: Adult Health | Admitting: Adult Health

## 2021-06-02 ENCOUNTER — Encounter (HOSPITAL_COMMUNITY): Payer: Self-pay

## 2021-06-02 VITALS — BP 114/66 | HR 88 | Wt 154.4 lb

## 2021-06-02 DIAGNOSIS — R0602 Shortness of breath: Secondary | ICD-10-CM | POA: Insufficient documentation

## 2021-06-02 DIAGNOSIS — Z09 Encounter for follow-up examination after completed treatment for conditions other than malignant neoplasm: Secondary | ICD-10-CM | POA: Insufficient documentation

## 2021-06-02 DIAGNOSIS — I130 Hypertensive heart and chronic kidney disease with heart failure and stage 1 through stage 4 chronic kidney disease, or unspecified chronic kidney disease: Secondary | ICD-10-CM | POA: Insufficient documentation

## 2021-06-02 DIAGNOSIS — Z7982 Long term (current) use of aspirin: Secondary | ICD-10-CM | POA: Diagnosis not present

## 2021-06-02 DIAGNOSIS — N1831 Chronic kidney disease, stage 3a: Secondary | ICD-10-CM | POA: Diagnosis not present

## 2021-06-02 DIAGNOSIS — G5603 Carpal tunnel syndrome, bilateral upper limbs: Secondary | ICD-10-CM | POA: Diagnosis not present

## 2021-06-02 DIAGNOSIS — G629 Polyneuropathy, unspecified: Secondary | ICD-10-CM | POA: Insufficient documentation

## 2021-06-02 DIAGNOSIS — I43 Cardiomyopathy in diseases classified elsewhere: Secondary | ICD-10-CM | POA: Diagnosis not present

## 2021-06-02 DIAGNOSIS — D126 Benign neoplasm of colon, unspecified: Secondary | ICD-10-CM | POA: Insufficient documentation

## 2021-06-02 DIAGNOSIS — Z8673 Personal history of transient ischemic attack (TIA), and cerebral infarction without residual deficits: Secondary | ICD-10-CM | POA: Insufficient documentation

## 2021-06-02 DIAGNOSIS — Z7902 Long term (current) use of antithrombotics/antiplatelets: Secondary | ICD-10-CM | POA: Insufficient documentation

## 2021-06-02 DIAGNOSIS — I5032 Chronic diastolic (congestive) heart failure: Secondary | ICD-10-CM | POA: Diagnosis not present

## 2021-06-02 DIAGNOSIS — Z1509 Genetic susceptibility to other malignant neoplasm: Secondary | ICD-10-CM | POA: Insufficient documentation

## 2021-06-02 DIAGNOSIS — E854 Organ-limited amyloidosis: Secondary | ICD-10-CM | POA: Diagnosis not present

## 2021-06-02 DIAGNOSIS — Z7984 Long term (current) use of oral hypoglycemic drugs: Secondary | ICD-10-CM | POA: Diagnosis not present

## 2021-06-02 DIAGNOSIS — E785 Hyperlipidemia, unspecified: Secondary | ICD-10-CM | POA: Insufficient documentation

## 2021-06-02 DIAGNOSIS — Z79899 Other long term (current) drug therapy: Secondary | ICD-10-CM | POA: Insufficient documentation

## 2021-06-02 DIAGNOSIS — I340 Nonrheumatic mitral (valve) insufficiency: Secondary | ICD-10-CM | POA: Insufficient documentation

## 2021-06-02 DIAGNOSIS — Z7901 Long term (current) use of anticoagulants: Secondary | ICD-10-CM | POA: Diagnosis not present

## 2021-06-02 LAB — BASIC METABOLIC PANEL
Anion gap: 10 (ref 5–15)
BUN: 27 mg/dL — ABNORMAL HIGH (ref 8–23)
CO2: 27 mmol/L (ref 22–32)
Calcium: 9.8 mg/dL (ref 8.9–10.3)
Chloride: 103 mmol/L (ref 98–111)
Creatinine, Ser: 1.64 mg/dL — ABNORMAL HIGH (ref 0.44–1.00)
GFR, Estimated: 32 mL/min — ABNORMAL LOW (ref >60)
Glucose, Bld: 91 mg/dL (ref 70–99)
Potassium: 4.2 mmol/L (ref 3.5–5.1)
Sodium: 140 mmol/L (ref 135–145)

## 2021-06-02 LAB — LIPID PANEL
Cholesterol: 221 mg/dL — ABNORMAL HIGH (ref 0–200)
HDL: 56 mg/dL (ref >40)
LDL Cholesterol: 150 mg/dL — ABNORMAL HIGH (ref 0–99)
Total CHOL/HDL Ratio: 3.9 RATIO
Triglycerides: 73 mg/dL (ref <150)
VLDL: 15 mg/dL (ref 0–40)

## 2021-06-02 NOTE — Patient Instructions (Signed)
It was great to see you today! No medication changes are needed at this time.  Labs today We will only contact you if something comes back abnormal or we need to make some changes. Otherwise no news is good news!  Your physician recommends that you schedule a follow-up appointment in: [place]  Do the following things EVERYDAY: Weigh yourself in the morning before breakfast. Write it down and keep it in a log. Take your medicines as prescribed Eat low salt foods--Limit salt (sodium) to 2000 mg per day.  Stay as active as you can everyday Limit all fluids for the day to less than 2 liters  At the [facility], you and your health needs are our priority. As part of our continuing mission to provide you with exceptional heart care, we have created designated Provider Care Teams. These Care Teams include your primary Cardiologist (physician) and Advanced Practice Providers (APPs- Physician Assistants and Nurse Practitioners) who all work together to provide you with the care you need, when you need it.   You may see any of the following providers on your designated Care Team at your next follow up: Dr Glori Bickers Dr Haynes Kerns, NP Lyda Jester, [place] [facility] [place], [place] Audry Riles, PharmD   Please be sure to bring in all your medications bottles to every appointment.    If you have any questions or concerns before your next appointment please send [place] a message through [place] or call our office at [phone].    TO LEAVE A MESSAGE FOR THE NURSE SELECT OPTION 2, PLEASE LEAVE A MESSAGE INCLUDING: YOUR NAME DATE OF BIRTH CALL BACK NUMBER REASON FOR CALL**this is important as we prioritize the call backs  YOU WILL RECEIVE A CALL BACK THE SAME DAY AS LONG AS YOU CALL BEFORE 4:00 PM

## 2021-06-03 ENCOUNTER — Other Ambulatory Visit (HOSPITAL_COMMUNITY): Payer: Self-pay | Admitting: Cardiology

## 2021-06-03 ENCOUNTER — Other Ambulatory Visit (HOSPITAL_COMMUNITY): Payer: Self-pay

## 2021-06-03 ENCOUNTER — Telehealth (HOSPITAL_COMMUNITY): Payer: Self-pay | Admitting: Pharmacist

## 2021-06-03 MED ORDER — VYNDAMAX 61 MG PO CAPS
1.0000 | ORAL_CAPSULE | Freq: Every day | ORAL | 11 refills | Status: DC
  Filled 2021-06-03: qty 30, 30d supply, fill #0
  Filled 2021-07-13: qty 30, 30d supply, fill #1
  Filled 2021-08-06: qty 30, 30d supply, fill #2
  Filled 2021-09-02: qty 30, 30d supply, fill #3
  Filled 2021-10-08: qty 30, 30d supply, fill #4
  Filled 2021-11-05 (×2): qty 30, 30d supply, fill #5
  Filled 2021-11-30 (×3): qty 30, 30d supply, fill #6
  Filled 2021-12-31: qty 30, 30d supply, fill #7
  Filled 2022-02-01: qty 30, 30d supply, fill #8
  Filled 2022-02-25: qty 30, 30d supply, fill #9
  Filled 2022-03-26: qty 30, 30d supply, fill #10
  Filled 2022-04-28: qty 30, 30d supply, fill #11

## 2021-06-03 NOTE — Telephone Encounter (Signed)
Patient Advocate Encounter   Received notification that prior authorization for Amvuttra is required.   PA submitted on CoverMyMeds Key [id number]  Status is pending   Will continue to follow.   Olie Scaffidi, PharmD, BCPS, BCCP, CPP Heart Failure Clinic Pharmacist [phone]  

## 2021-06-04 ENCOUNTER — Other Ambulatory Visit (HOSPITAL_COMMUNITY): Payer: Self-pay

## 2021-06-04 DIAGNOSIS — E851 Neuropathic heredofamilial amyloidosis: Secondary | ICD-10-CM | POA: Diagnosis not present

## 2021-06-08 ENCOUNTER — Other Ambulatory Visit (HOSPITAL_COMMUNITY): Payer: Self-pay

## 2021-06-08 NOTE — Telephone Encounter (Signed)
Advanced Heart Failure Patient Advocate Encounter  Prior Authorization for Amvuttra has been approved.    PA# [id number] Effective dates: 06/03/21 through 08/01/22  Patients co-pay is $0  Patient will receive last Onpattro treatment 11/08. Will then switch over to Amvuttra treatments every 3 months.

## 2021-06-09 ENCOUNTER — Other Ambulatory Visit (HOSPITAL_COMMUNITY): Payer: Self-pay

## 2021-06-09 DIAGNOSIS — E851 Neuropathic heredofamilial amyloidosis: Secondary | ICD-10-CM | POA: Diagnosis not present

## 2021-06-10 ENCOUNTER — Other Ambulatory Visit (HOSPITAL_COMMUNITY): Payer: Self-pay

## 2021-06-10 MED ORDER — AMVUTTRA 25 MG/0.5ML ~~LOC~~ SOSY
25.0000 mg | PREFILLED_SYRINGE | SUBCUTANEOUS | 3 refills | Status: DC
  Filled 2021-06-10 – 2021-06-22 (×2): qty 0.5, 90d supply, fill #0
  Filled 2021-09-21: qty 0.5, 90d supply, fill #1
  Filled 2021-12-29: qty 0.5, 90d supply, fill #2
  Filled 2022-03-26: qty 0.5, 90d supply, fill #3
  Filled ????-??-??: fill #3

## 2021-06-10 NOTE — Telephone Encounter (Signed)
Patient has been approved to start [place]. She received last infusion of Onpattro on 06/09/21. She would be due for her first Amvuttra injection on 06/30/21. I have spoken with the patient and have scheduled a pharmacy clinic appointment for 06/30/21 for the Amvuttra injection. The medication will be couriered from [employer] and administered in clinic. I have also sent the Amvuttra start form to [place] today so patient can be enrolled.    Audry Riles, PharmD, BCPS, BCCP, CPP Heart Failure Clinic Pharmacist [phone]

## 2021-06-17 ENCOUNTER — Other Ambulatory Visit (HOSPITAL_COMMUNITY): Payer: Self-pay

## 2021-06-22 ENCOUNTER — Other Ambulatory Visit (HOSPITAL_COMMUNITY): Payer: Self-pay

## 2021-06-22 ENCOUNTER — Telehealth (HOSPITAL_COMMUNITY): Payer: Self-pay | Admitting: Licensed Clinical Social Worker

## 2021-06-22 NOTE — Telephone Encounter (Signed)
CSW consulted to help with transportation to pt Pharmacy Clinic appt next week- was able to set up through [employer]- pt updated regarding estimated pick up.  Will continue to follow and assist as needed  Jorge Ny, [facility] Desk#: [phone] Cell#: [phone]

## 2021-06-22 NOTE — Progress Notes (Signed)
PCP: Dr. [name] HF Cardiology: Dr. [name]  HPI:  75 y.o. with history of chronic diastolic CHF, mitral regurgitation, CVA, TTR amyloid, and CKD .    Patient was initially admitted with diastolic CHF in 08/2749. Echo showed EF 60-65% with grade 3 diastolic dysfunction and moderate MR.  She had cath showing no coronary disease. She was admitted in 01/2017 with left cerebellar CVA and LINQ monitor was placed.  TEE this admission showed moderate-severe MR.  She was admitted again in 05/2017 with acute on chronic diastolic CHF. Echo this admission showed EF 50-55%, severe LVH, restrictive diastolic dysfunction, severe MR, moderate-severe TR.  She was referred to Dr. [name] who saw her for mitral valve evaluation.  Upon review of studies at that time, regurgitation thought to be more in the moderate range and likely functional.    Patient had PYP amyloid scan in 07/2017, this was suggestive of transthyretin amyloidosis.  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    Admission for A/C diastolic HF 7/0-0/17/4944. Required IV diuresis, then transitioned to 80 mg PO Lasix BID. Metoprolol was DC'd due to low blood pressures. Cardiac MRI strongly suggestive of amyloidosis, moderate to severe MR (moderate by regurgitant fraction 41% but severe visually). Echo repeated: EF 40-45%, moderate to severe LVH, severe MR, severe dilation of LA and RA, moderate TR, PASP 49.   She had Mitraclip x 2 placed in 11/2017.  Repeat echo post-mitraclip showed EF 55-60%, moderate LVH, mean gradient 3 mmHg across the MV with trace-mild MR.    Cardiomems was placed 12/2017.     Echo 01/2019 showed EF 55% with moderate LVH, s/p Mitraclip with mean gradient 3 mmHg, mild MR, normal RV, PASP 41 mmHg.    Echo in 06/2020 showed EF 50-55%, moderate LVH, normal RV, PASP 44 mmHg, s/p Mitraclip with mean gradient 5 mmHg with mild MR.     Today she returns to HF clinic for pharmacist medication titration. At last visit with  APP, Onpattro was transitioned to [place]. She was referred to Pharmacy Clinic for Amvuttra injection. Last Onpattro infusion was 06/09/21.  Overall feeling well today. No contraindications to injection.    Assessment/Plan: 1. Cardiac amyloidosis: Hereditary TTR amyloidosis.  She has peripheral neuropathy as well as bilateral carpal tunnel syndrome. Normal EF on 05/2017 echo with severe LVH and speckled myocardium consistent with cardiac amyloidosis.  SPEP was negative. 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 Cardiac MRI also strongly suggestive of cardiac amyloidosis.   - She is now on tafamidis, no side effects.  Receives from [facility]. - Amvuttra (vutrisiran) injection administered in clinic today. Patient tolerated injection well. Provided patient counseling on Amvuttra. Most common side effects are injection site reactions, arthralgias and dyspnea. Patient is aware to return to clinic every 3 months for repeat injection. Amvuttra will be obtained from [facility] and couriered to clinic for injection.  - Continue vitamin A supplement 8000 IU daily. Amvuttra decreases serum vitamin A levels. 2. 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  Neuropathy is FAP 1 and PND 1.  - Continue Amvuttra as above   Follow up 3 months with Pharmacy Clinic for Repeat Injection (09/30/21)   Audry Riles, PharmD, BCPS, BCCP, CPP Heart Failure Clinic Pharmacist [phone]

## 2021-06-23 ENCOUNTER — Other Ambulatory Visit (HOSPITAL_COMMUNITY): Payer: Self-pay

## 2021-06-24 ENCOUNTER — Other Ambulatory Visit (HOSPITAL_COMMUNITY): Payer: Self-pay

## 2021-06-30 ENCOUNTER — Ambulatory Visit (HOSPITAL_COMMUNITY)
Admission: RE | Admit: 2021-06-30 | Discharge: 2021-06-30 | Disposition: A | Discharge status: Home / Self Care | Payer: Medicare Other | Source: Ambulatory Visit | Attending: Cardiology | Admitting: Cardiology

## 2021-06-30 ENCOUNTER — Other Ambulatory Visit (HOSPITAL_COMMUNITY): Payer: Self-pay

## 2021-06-30 DIAGNOSIS — I43 Cardiomyopathy in diseases classified elsewhere: Secondary | ICD-10-CM

## 2021-06-30 DIAGNOSIS — E854 Organ-limited amyloidosis: Secondary | ICD-10-CM | POA: Diagnosis not present

## 2021-06-30 DIAGNOSIS — I5032 Chronic diastolic (congestive) heart failure: Secondary | ICD-10-CM | POA: Diagnosis not present

## 2021-06-30 DIAGNOSIS — Z79899 Other long term (current) drug therapy: Secondary | ICD-10-CM | POA: Insufficient documentation

## 2021-06-30 DIAGNOSIS — E859 Amyloidosis, unspecified: Secondary | ICD-10-CM | POA: Insufficient documentation

## 2021-06-30 DIAGNOSIS — G629 Polyneuropathy, unspecified: Secondary | ICD-10-CM | POA: Insufficient documentation

## 2021-06-30 DIAGNOSIS — G5603 Carpal tunnel syndrome, bilateral upper limbs: Secondary | ICD-10-CM | POA: Diagnosis not present

## 2021-06-30 DIAGNOSIS — I340 Nonrheumatic mitral (valve) insufficiency: Secondary | ICD-10-CM | POA: Insufficient documentation

## 2021-06-30 DIAGNOSIS — Z8673 Personal history of transient ischemic attack (TIA), and cerebral infarction without residual deficits: Secondary | ICD-10-CM | POA: Insufficient documentation

## 2021-06-30 DIAGNOSIS — N189 Chronic kidney disease, unspecified: Secondary | ICD-10-CM | POA: Diagnosis not present

## 2021-06-30 MED ORDER — VUTRISIRAN SODIUM 25 MG/0.5ML ~~LOC~~ SOSY
25.0000 mg | PREFILLED_SYRINGE | Freq: Once | SUBCUTANEOUS | Status: AC
  Administered 2021-06-30: 25 mg via SUBCUTANEOUS

## 2021-06-30 NOTE — Patient Instructions (Addendum)
It was a pleasure seeing you today!  MEDICATIONS: -Call if you have questions about your medications.   NEXT APPOINTMENT: Return to clinic in 3 months with Pharmacy Clinic for repeat Amvuttra injection. Injections are repeated every 3 months.  In general, to take care of your heart failure: -Limit your fluid intake to 2 Liters (half-gallon) per day.   -Limit your salt intake to ideally 2-3 grams (2000-3000 mg) per day. -Weigh yourself daily and record, and bring that "weight diary" to your next appointment.  (Weight gain of 2-3 pounds in 1 day typically means fluid weight.) -The medications for your heart are to help your heart and help you live longer.   -Please contact us before stopping any of your heart medications.  Call the clinic at [phone] with questions or to reschedule future appointments.

## 2021-07-02 ENCOUNTER — Other Ambulatory Visit (HOSPITAL_COMMUNITY): Payer: Self-pay

## 2021-07-09 ENCOUNTER — Other Ambulatory Visit (HOSPITAL_COMMUNITY): Payer: Self-pay

## 2021-07-13 ENCOUNTER — Other Ambulatory Visit (HOSPITAL_COMMUNITY): Payer: Self-pay

## 2021-07-14 DIAGNOSIS — H401131 Primary open-angle glaucoma, bilateral, mild stage: Secondary | ICD-10-CM | POA: Diagnosis not present

## 2021-07-22 ENCOUNTER — Telehealth (HOSPITAL_COMMUNITY): Payer: Self-pay | Admitting: Pharmacist

## 2021-07-22 NOTE — Telephone Encounter (Signed)
Patient Advocate Encounter   Received notification from [employer] that prior authorization for Vyndamax is required.   PA submitted on CoverMyMeds Key [id number] Status is pending   Will continue to follow.  Audry Riles, PharmD, BCPS, BCCP, CPP Heart Failure Clinic Pharmacist [phone]

## 2021-07-22 NOTE — Telephone Encounter (Signed)
Advanced Heart Failure Patient Advocate Encounter  Prior Authorization for [place] has been approved.    PA# [id number] Effective dates: 07/22/21 through 08/01/22  Audry Riles, PharmD, BCPS, BCCP, CPP Heart Failure Clinic Pharmacist [phone]

## 2021-07-23 ENCOUNTER — Other Ambulatory Visit (HOSPITAL_COMMUNITY): Payer: Self-pay

## 2021-07-24 ENCOUNTER — Other Ambulatory Visit (HOSPITAL_COMMUNITY): Payer: Self-pay

## 2021-07-28 DIAGNOSIS — D0510 Intraductal carcinoma in situ of unspecified breast: Secondary | ICD-10-CM | POA: Diagnosis not present

## 2021-07-28 DIAGNOSIS — E782 Mixed hyperlipidemia: Secondary | ICD-10-CM | POA: Diagnosis not present

## 2021-07-28 DIAGNOSIS — I639 Cerebral infarction, unspecified: Secondary | ICD-10-CM | POA: Diagnosis not present

## 2021-07-28 DIAGNOSIS — M1990 Unspecified osteoarthritis, unspecified site: Secondary | ICD-10-CM | POA: Diagnosis not present

## 2021-07-28 DIAGNOSIS — I10 Essential (primary) hypertension: Secondary | ICD-10-CM | POA: Diagnosis not present

## 2021-07-28 DIAGNOSIS — C649 Malignant neoplasm of unspecified kidney, except renal pelvis: Secondary | ICD-10-CM | POA: Diagnosis not present

## 2021-07-28 DIAGNOSIS — I5032 Chronic diastolic (congestive) heart failure: Secondary | ICD-10-CM | POA: Diagnosis not present

## 2021-07-28 DIAGNOSIS — N1832 Chronic kidney disease, stage 3b: Secondary | ICD-10-CM | POA: Diagnosis not present

## 2021-07-28 DIAGNOSIS — E039 Hypothyroidism, unspecified: Secondary | ICD-10-CM | POA: Diagnosis not present

## 2021-07-28 DIAGNOSIS — I5043 Acute on chronic combined systolic (congestive) and diastolic (congestive) heart failure: Secondary | ICD-10-CM | POA: Diagnosis not present

## 2021-07-28 DIAGNOSIS — H35033 Hypertensive retinopathy, bilateral: Secondary | ICD-10-CM | POA: Diagnosis not present

## 2021-07-28 DIAGNOSIS — I272 Pulmonary hypertension, unspecified: Secondary | ICD-10-CM | POA: Diagnosis not present

## 2021-08-04 ENCOUNTER — Other Ambulatory Visit: Payer: Self-pay

## 2021-08-04 ENCOUNTER — Ambulatory Visit (HOSPITAL_COMMUNITY)
Admission: RE | Admit: 2021-08-04 | Discharge: 2021-08-04 | Disposition: A | Discharge status: Home / Self Care | Payer: Medicare Other | Source: Ambulatory Visit | Attending: Adult Health | Admitting: Adult Health

## 2021-08-04 DIAGNOSIS — I5032 Chronic diastolic (congestive) heart failure: Secondary | ICD-10-CM

## 2021-08-04 DIAGNOSIS — I5033 Acute on chronic diastolic (congestive) heart failure: Secondary | ICD-10-CM | POA: Diagnosis not present

## 2021-08-04 NOTE — Progress Notes (Signed)
° °  I have reviewed weekly PAD from cardiomems device.   PAD Goal 19. Todays reading is 17.  PAD reading stable. Continue current diuretic regimen.   Follow monthly readings. No change.   Anjalee Cope NP-C  3:37 PM

## 2021-08-05 NOTE — Progress Notes (Signed)
Follow-up Visit   Date: 08/07/21    Katie [name] MRN: [id number] DOB: 1946-05-25   Interim History: Katie [name] is a 76 y.o. 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  The patient was accompanied to the clinic by self.  History of present illness: She has history of heart failure since 2017 and underwent PYP amyloid scan in December suggestive of transthyretin amyloidosis.  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  She is being evaluated for patisiran treatment.   From a neurological standpoint, patient has had history of bilateral carpal tunnel release and continues to have hand tingling.  She has some weakness related to arthritis, but denies dropping objects. Over the past year, she has intermittent tingling of both feet.  She denies imbalance and has not suffered any falls.  She walks unassisted.  She manages her own household duties such as cooking, cleaning, and driving. Her son who is an amputee lives with her.  There is no family history of neuropathy.   Of note, she had a left cerebellar stroke in July 2018 secondary to embolic basilar occlusion and absent flow in bilateral SCA and PCAs.  She was treated with high-dose statin and dual antiplatelet therapy. Subsequent MRA from March 2019 shows no residual stenosis.  UPDATE 07/23/2020:  She was hospitalized with AKI and rhadomylosis in November.  CK trending down with hydration and her statin was discontinued.  She was discharged to rehab and has been home for the past two weeks getting home PT.  She is walking with a cane and complains of bilateral knee and ankle pain.  She denies muscle pain.  Her  neuropathy is unchanged.  She still has numbness and tingling in the hands and occasionally in the feet.    UPDATE 08/07/2021: She is here for follow-up visit. She has been transitioned to [place] since November and is tolerating this well. There has been no progression of neuropathy. She continues to have numbness/tingling in the hands and feet which is stable. No new weakness. She had one fall in her kitchen after loosing balance. Fortunately, no injuries. Her primary complaint is ongoing bilateral hip pain from arthritis. She was offered hip replacement surgery, but due to her medical comorbidities has decided against this.  She complains of generalized pain in her knees and legs.  Medications:  Current Outpatient Medications on File Prior to Visit  Medication Sig Dispense Refill   acetaminophen (TYLENOL) 500 MG tablet Take 1,000 mg by mouth every 6 (six) hours as needed for headache (pain).     aspirin 81 MG tablet Take 1 tablet (81 mg total) daily by mouth.     butalbital-acetaminophen-caffeine (FIORICET, ESGIC) 50-325-40 MG tablet Take 1 tablet by mouth every 12 (twelve) hours as needed for headache. 14 tablet 0   carvedilol (COREG) 3.125 MG tablet Take 1 tablet (3.125 mg total) by mouth 2 (two) times daily with a meal. 120 tablet 3   clopidogrel (PLAVIX) 75 MG tablet Take 1 tablet (75 mg total) by mouth daily. 30 tablet 1   empagliflozin (JARDIANCE) 10 MG TABS tablet Take 1 tablet (10 mg total) by mouth daily before breakfast. 30 tablet 11   EPINEPHrine 0.3 mg/0.3 mL IJ SOAJ injection Inject 1 mg into  the muscle as needed.     Evolocumab (REPATHA SURECLICK) 536 MG/ML SOAJ Inject 140 mg into the skin every 14 (fourteen) days. 2 mL 11   ezetimibe (ZETIA) 10 MG tablet TAKE 1 TABLET(10 MG) BY MOUTH DAILY 30 tablet 3   feeding supplement, ENSURE ENLIVE, (ENSURE ENLIVE) LIQD Take 237 mLs by mouth 2 (two) times daily between meals. 237 mL 0   furosemide (LASIX) 20 MG tablet TAKE 2 TABLETS BY MOUTH IN  THE MORNING AND THEN 1 TABLET EVERY EVENING 90 tablet 3   gabapentin (NEURONTIN) 300 MG capsule Take 300 mg by mouth 2 (two) times daily.     Hypromellose (ARTIFICIAL TEARS OP) Place 1 drop into both eyes daily as needed (dry eyes).     levothyroxine (SYNTHROID, LEVOTHROID) 25 MCG tablet Take 25 mcg by mouth daily before breakfast.     Na Sulfate-K Sulfate-Mg Sulf 17.5-3.13-1.6 GM/177ML SOLN See admin instructions.     potassium chloride SA (KLOR-CON) 20 MEQ tablet Take 1 tablet (20 mEq total) by mouth daily. 60 tablet 0   Tafamidis (VYNDAMAX) 61 MG CAPS TAKE 1 CAPSULE BY MOUTH ONCE A DAY 30 capsule 11   Vitamin A 2400 MCG (8000 UT) CAPS Take by mouth.     Vitamin A 3 MG (10000 UT) TABS 1 tablet     vutrisiran sodium (AMVUTTRA) 25 MG/0.5ML syringe Inject 0.5 mLs (25 mg total) into the skin every 3 (three) months. 0.5 mL 3   No current facility-administered medications on file prior to visit.    Allergies:  Allergies  Allergen Reactions   Crestor [Rosuvastatin] Other (See Comments)    Drug interaction w/ tafamidis, caused rhabdomyolysis   Tramadol Shortness Of Breath and Palpitations    Vital Signs:  BP (!) 163/87    Pulse 81    Resp 18    Ht 5\' 4"  (1.626 m)    Wt 157 lb (71.2 kg)    LMP 02/12/2013    SpO2 95%    BMI 26.95 kg/m    Neurological Exam: MENTAL STATUS including orientation to time, place, person, recent and remote memory, attention span and concentration, language, and fund of knowledge is normal.  Speech is not dysarthric.  CRANIAL NERVES: Pupils equal round and reactive to light.  Normal conjugate, extra-ocular eye movements in all directions of gaze.  No ptosis.   MOTOR:  Motor strength is 5/5 in all extremities, except 5-/5 bilateral interosseus muscles. Hip flexion motor strength is 5/5, but ROM is reduced due to pain. No atrophy, fasciculations or abnormal movements.  No pronator drift.  Tone is normal.    MSRs:  Reflexes are 2+/4 throughout, except 1+/4 bilateral  Achilles reflex.  SENSORY: Vibration is reduced at the great toe bilaterally.  Sensation is intact at the MCP bilaterally.  COORDINATION/GAIT:    Gait is slow, wide based, assisted with a cane, unsteady.   Data: MRA head 10/14/2017:  Negative intracranial MRA. No residual or recurrent distal basilar Stenosis.   Cardiac SPECT 07/06/2017:  Visual and quantitative assessment (grade 3, H/CLL equal 1.94) are strongly suggestive of transthyretin amyloidosis.   MRI/A head 04/22/2017:    Chronic ischemic changes as above.  No acute infarct.  No significant intracranial stenosis on MRA. Previously noted embolic occlusion distal basilar extending into the posterior cerebral artery bilaterally has resolved.  NCS/EMG of the right side 12/15/2017: The electrophysiologic findings are most consistent with a sensorimotor polyneuropathy, demyelinating with secondary axon loss, affecting the right upper extremity; moderate in  degree electrically.     Alternatively, right carpal tunnel and cubital tunnel syndrome cannot be excluded. Correlate clinically.  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  She has bilateral CTS release and nonlength dependent neuropathy.  Tafamidis started in 2019.    - Previously on Onpatrro, transitioned to [place] in November 2022  - Start home PT for gait training  - Encouraged patient to start using a walker  - Patient educated on daily foot inspection, fall prevention, and safety precautions around the home.   2.  History of left cerebellar embolic stroke (9937) secondary to basilar occlusion, no residual symptoms.  - Continue aspiring 81mg  + plavix 75mg  daily  - Continue zetia 10mg   Return to clinic in 9 months   Thank you for allowing me to participate in patient's care.  If I can answer any additional questions, I would be pleased to do so.    Sincerely,    Ascencion Stegner K. Posey Pronto, DO

## 2021-08-06 ENCOUNTER — Other Ambulatory Visit (HOSPITAL_COMMUNITY): Payer: Self-pay

## 2021-08-07 ENCOUNTER — Ambulatory Visit (INDEPENDENT_AMBULATORY_CARE_PROVIDER_SITE_OTHER): Payer: Medicare Other | Admitting: Neurology

## 2021-08-07 ENCOUNTER — Encounter: Payer: Self-pay | Admitting: Neurology

## 2021-08-07 ENCOUNTER — Other Ambulatory Visit: Payer: Self-pay

## 2021-08-07 VITALS — BP 163/87 | HR 81 | Resp 18 | Ht 64.0 in | Wt 157.0 lb

## 2021-08-07 DIAGNOSIS — G990 Autonomic neuropathy in diseases classified elsewhere: Secondary | ICD-10-CM | POA: Diagnosis not present

## 2021-08-07 DIAGNOSIS — I639 Cerebral infarction, unspecified: Secondary | ICD-10-CM | POA: Diagnosis not present

## 2021-08-07 DIAGNOSIS — E854 Organ-limited amyloidosis: Secondary | ICD-10-CM | POA: Diagnosis not present

## 2021-08-07 DIAGNOSIS — R2681 Unsteadiness on feet: Secondary | ICD-10-CM

## 2021-08-07 DIAGNOSIS — D472 Monoclonal gammopathy: Secondary | ICD-10-CM | POA: Diagnosis not present

## 2021-08-07 NOTE — Addendum Note (Signed)
Addended by: Armen Pickup A on: 08/07/2021 09:41 AM   Modules accepted: Orders

## 2021-08-07 NOTE — Patient Instructions (Addendum)
We will refer you for home physical therapy  Start using a walker  Return to clinic in 9 month

## 2021-08-12 ENCOUNTER — Telehealth: Payer: Self-pay

## 2021-08-12 DIAGNOSIS — E785 Hyperlipidemia, unspecified: Secondary | ICD-10-CM

## 2021-08-12 DIAGNOSIS — Z0000 Encounter for general adult medical examination without abnormal findings: Secondary | ICD-10-CM

## 2021-08-12 NOTE — Telephone Encounter (Signed)
-----   Message from Rockne Menghini, [place] sent at 08/12/2021  7:52 AM EST ----- Regarding: RE: ldl increase Reach out to patient and see if she is still taking Repatha.  If so, let's have her repeat labs in the next week or two and see if there has been any change.  Thanks,  Erasmo Downer ----- Message ----- From: Allean Found, CMA Sent: 07/30/2021   9:02 AM EST To: Rockne Menghini, RPH-CPP Subject: ldl increase                                   8 point ldl increase what should I do/

## 2021-08-12 NOTE — Telephone Encounter (Signed)
Called patient to discuss arranging transportation to be seen for labs as requested by pharmacy at [facility]. Patient expressed that she was in pain and did not feel like going anywhere and doesn't see it getting any better. Offered to call the patient back next week by 1/18 to get an update on her status and to discuss transportation again. Patient verbally agreed to receiving a follow up call by 1/18.   Jina Olenick Truman Hayward, [facility] Guide, Health Coach [address], [address] [place] [address] Telephone: [phone] Email: [email or web address]

## 2021-08-12 NOTE — Telephone Encounter (Signed)
I called and asked the pt to see if they was taking the repatha and they stated that they were so I ordered fasting lipid and hepatic panel that is to be completed at the [facility] or [facility] but they stated that they do not have transportation I mentioned that I would send a msg for amy [facility] health coach as she is an expert in that area.

## 2021-08-18 DIAGNOSIS — N183 Chronic kidney disease, stage 3 unspecified: Secondary | ICD-10-CM | POA: Diagnosis not present

## 2021-08-18 DIAGNOSIS — I5032 Chronic diastolic (congestive) heart failure: Secondary | ICD-10-CM | POA: Diagnosis not present

## 2021-08-18 DIAGNOSIS — M6282 Rhabdomyolysis: Secondary | ICD-10-CM | POA: Diagnosis not present

## 2021-08-18 DIAGNOSIS — I130 Hypertensive heart and chronic kidney disease with heart failure and stage 1 through stage 4 chronic kidney disease, or unspecified chronic kidney disease: Secondary | ICD-10-CM | POA: Diagnosis not present

## 2021-08-18 DIAGNOSIS — N179 Acute kidney failure, unspecified: Secondary | ICD-10-CM | POA: Diagnosis not present

## 2021-08-18 DIAGNOSIS — D472 Monoclonal gammopathy: Secondary | ICD-10-CM | POA: Diagnosis not present

## 2021-08-24 ENCOUNTER — Telehealth: Payer: Self-pay | Admitting: Neurology

## 2021-08-24 DIAGNOSIS — I5032 Chronic diastolic (congestive) heart failure: Secondary | ICD-10-CM | POA: Diagnosis not present

## 2021-08-24 DIAGNOSIS — N183 Chronic kidney disease, stage 3 unspecified: Secondary | ICD-10-CM | POA: Diagnosis not present

## 2021-08-24 DIAGNOSIS — N179 Acute kidney failure, unspecified: Secondary | ICD-10-CM | POA: Diagnosis not present

## 2021-08-24 DIAGNOSIS — M6282 Rhabdomyolysis: Secondary | ICD-10-CM | POA: Diagnosis not present

## 2021-08-24 DIAGNOSIS — I130 Hypertensive heart and chronic kidney disease with heart failure and stage 1 through stage 4 chronic kidney disease, or unspecified chronic kidney disease: Secondary | ICD-10-CM | POA: Diagnosis not present

## 2021-08-24 DIAGNOSIS — D472 Monoclonal gammopathy: Secondary | ICD-10-CM | POA: Diagnosis not present

## 2021-08-24 NOTE — Telephone Encounter (Signed)
Patient needs to notify her PCP.

## 2021-08-24 NOTE — Telephone Encounter (Signed)
Patients home health aide called stating she was with Katie [name] earlier today and she has swelling in her hands and right leg.  A few days ago when Katie [name] got off the couch she heard a pop in the back of her knee, and the swelling occurred after this.  She wanted to call and let the Dr know.

## 2021-08-25 NOTE — Telephone Encounter (Signed)
Called Katie [name] from [facility] and left a message for a call back.

## 2021-08-26 DIAGNOSIS — N183 Chronic kidney disease, stage 3 unspecified: Secondary | ICD-10-CM | POA: Diagnosis not present

## 2021-08-26 DIAGNOSIS — I130 Hypertensive heart and chronic kidney disease with heart failure and stage 1 through stage 4 chronic kidney disease, or unspecified chronic kidney disease: Secondary | ICD-10-CM | POA: Diagnosis not present

## 2021-08-26 DIAGNOSIS — I5032 Chronic diastolic (congestive) heart failure: Secondary | ICD-10-CM | POA: Diagnosis not present

## 2021-08-26 DIAGNOSIS — M6282 Rhabdomyolysis: Secondary | ICD-10-CM | POA: Diagnosis not present

## 2021-08-26 DIAGNOSIS — N179 Acute kidney failure, unspecified: Secondary | ICD-10-CM | POA: Diagnosis not present

## 2021-08-26 DIAGNOSIS — D472 Monoclonal gammopathy: Secondary | ICD-10-CM | POA: Diagnosis not present

## 2021-08-26 NOTE — Telephone Encounter (Signed)
Called Sharee Pimple again and left a message that She will need to notify patients PCP in regards to her call for patient. Left my contact information incase Sharee Pimple had any questions or concerns.

## 2021-08-27 DIAGNOSIS — N183 Chronic kidney disease, stage 3 unspecified: Secondary | ICD-10-CM | POA: Diagnosis not present

## 2021-08-27 DIAGNOSIS — M6282 Rhabdomyolysis: Secondary | ICD-10-CM | POA: Diagnosis not present

## 2021-08-27 DIAGNOSIS — I5032 Chronic diastolic (congestive) heart failure: Secondary | ICD-10-CM | POA: Diagnosis not present

## 2021-08-27 DIAGNOSIS — I130 Hypertensive heart and chronic kidney disease with heart failure and stage 1 through stage 4 chronic kidney disease, or unspecified chronic kidney disease: Secondary | ICD-10-CM | POA: Diagnosis not present

## 2021-08-27 DIAGNOSIS — D472 Monoclonal gammopathy: Secondary | ICD-10-CM | POA: Diagnosis not present

## 2021-08-27 DIAGNOSIS — N179 Acute kidney failure, unspecified: Secondary | ICD-10-CM | POA: Diagnosis not present

## 2021-09-01 DIAGNOSIS — I5032 Chronic diastolic (congestive) heart failure: Secondary | ICD-10-CM | POA: Diagnosis not present

## 2021-09-01 DIAGNOSIS — M6282 Rhabdomyolysis: Secondary | ICD-10-CM | POA: Diagnosis not present

## 2021-09-01 DIAGNOSIS — D472 Monoclonal gammopathy: Secondary | ICD-10-CM | POA: Diagnosis not present

## 2021-09-01 DIAGNOSIS — N179 Acute kidney failure, unspecified: Secondary | ICD-10-CM | POA: Diagnosis not present

## 2021-09-01 DIAGNOSIS — N183 Chronic kidney disease, stage 3 unspecified: Secondary | ICD-10-CM | POA: Diagnosis not present

## 2021-09-01 DIAGNOSIS — I130 Hypertensive heart and chronic kidney disease with heart failure and stage 1 through stage 4 chronic kidney disease, or unspecified chronic kidney disease: Secondary | ICD-10-CM | POA: Diagnosis not present

## 2021-09-02 ENCOUNTER — Ambulatory Visit (HOSPITAL_COMMUNITY)
Admission: RE | Admit: 2021-09-02 | Discharge: 2021-09-02 | Disposition: A | Discharge status: Home / Self Care | Payer: Medicare Other | Source: Ambulatory Visit | Attending: Cardiology | Admitting: Cardiology

## 2021-09-02 ENCOUNTER — Encounter (HOSPITAL_COMMUNITY): Payer: Self-pay | Admitting: Cardiology

## 2021-09-02 ENCOUNTER — Telehealth: Payer: Self-pay

## 2021-09-02 ENCOUNTER — Other Ambulatory Visit (HOSPITAL_COMMUNITY): Payer: Self-pay

## 2021-09-02 ENCOUNTER — Other Ambulatory Visit: Payer: Self-pay

## 2021-09-02 VITALS — BP 148/78 | HR 87 | Ht 64.0 in | Wt 150.0 lb

## 2021-09-02 DIAGNOSIS — Z5941 Food insecurity: Secondary | ICD-10-CM | POA: Diagnosis not present

## 2021-09-02 DIAGNOSIS — Z954 Presence of other heart-valve replacement: Secondary | ICD-10-CM | POA: Insufficient documentation

## 2021-09-02 DIAGNOSIS — Z79899 Other long term (current) drug therapy: Secondary | ICD-10-CM | POA: Insufficient documentation

## 2021-09-02 DIAGNOSIS — E854 Organ-limited amyloidosis: Secondary | ICD-10-CM | POA: Diagnosis not present

## 2021-09-02 DIAGNOSIS — E039 Hypothyroidism, unspecified: Secondary | ICD-10-CM | POA: Diagnosis not present

## 2021-09-02 DIAGNOSIS — I340 Nonrheumatic mitral (valve) insufficiency: Secondary | ICD-10-CM

## 2021-09-02 DIAGNOSIS — M160 Bilateral primary osteoarthritis of hip: Secondary | ICD-10-CM | POA: Insufficient documentation

## 2021-09-02 DIAGNOSIS — N183 Chronic kidney disease, stage 3 unspecified: Secondary | ICD-10-CM | POA: Insufficient documentation

## 2021-09-02 DIAGNOSIS — I130 Hypertensive heart and chronic kidney disease with heart failure and stage 1 through stage 4 chronic kidney disease, or unspecified chronic kidney disease: Secondary | ICD-10-CM | POA: Diagnosis not present

## 2021-09-02 DIAGNOSIS — M1610 Unilateral primary osteoarthritis, unspecified hip: Secondary | ICD-10-CM | POA: Diagnosis not present

## 2021-09-02 DIAGNOSIS — I43 Cardiomyopathy in diseases classified elsewhere: Secondary | ICD-10-CM | POA: Diagnosis not present

## 2021-09-02 DIAGNOSIS — G5603 Carpal tunnel syndrome, bilateral upper limbs: Secondary | ICD-10-CM | POA: Diagnosis not present

## 2021-09-02 DIAGNOSIS — Z7984 Long term (current) use of oral hypoglycemic drugs: Secondary | ICD-10-CM | POA: Diagnosis not present

## 2021-09-02 DIAGNOSIS — I5032 Chronic diastolic (congestive) heart failure: Secondary | ICD-10-CM

## 2021-09-02 DIAGNOSIS — Z7902 Long term (current) use of antithrombotics/antiplatelets: Secondary | ICD-10-CM | POA: Insufficient documentation

## 2021-09-02 DIAGNOSIS — E049 Nontoxic goiter, unspecified: Secondary | ICD-10-CM | POA: Diagnosis not present

## 2021-09-02 DIAGNOSIS — E785 Hyperlipidemia, unspecified: Secondary | ICD-10-CM | POA: Diagnosis not present

## 2021-09-02 DIAGNOSIS — Z8673 Personal history of transient ischemic attack (TIA), and cerebral infarction without residual deficits: Secondary | ICD-10-CM | POA: Insufficient documentation

## 2021-09-02 DIAGNOSIS — G629 Polyneuropathy, unspecified: Secondary | ICD-10-CM | POA: Insufficient documentation

## 2021-09-02 LAB — LIPID PANEL
Cholesterol: 189 mg/dL (ref 0–200)
HDL: 55 mg/dL (ref >40)
LDL Cholesterol: 110 mg/dL — ABNORMAL HIGH (ref 0–99)
Total CHOL/HDL Ratio: 3.4 RATIO
Triglycerides: 121 mg/dL (ref <150)
VLDL: 24 mg/dL (ref 0–40)

## 2021-09-02 LAB — BASIC METABOLIC PANEL
Anion gap: 8 (ref 5–15)
BUN: 26 mg/dL — ABNORMAL HIGH (ref 8–23)
CO2: 27 mmol/L (ref 22–32)
Calcium: 9.9 mg/dL (ref 8.9–10.3)
Chloride: 104 mmol/L (ref 98–111)
Creatinine, Ser: 1.68 mg/dL — ABNORMAL HIGH (ref 0.44–1.00)
GFR, Estimated: 31 mL/min — ABNORMAL LOW (ref >60)
Glucose, Bld: 87 mg/dL (ref 70–99)
Potassium: 4 mmol/L (ref 3.5–5.1)
Sodium: 139 mmol/L (ref 135–145)

## 2021-09-02 NOTE — Patient Instructions (Signed)
Labs done today. We will contact you only if your labs are abnormal.  No medication changes were made. Please continue all current medications as prescribed.  You have been referred to [facility] They will contact you to schedule an appointment.   Your physician recommends that you schedule a follow-up appointment soon for an echo and in 4 months with our NP/PA Clinic here in our office.   Your physician has requested that you have an echocardiogram. Echocardiography is a painless test that uses sound waves to create images of your heart. It provides your doctor with information about the size and shape of your heart and how well your hearts chambers and valves are working. This procedure takes approximately one hour. There are no restrictions for this procedure.  If you have any questions or concerns before your next appointment please send [place] a message through [place] or call our office at [phone].    TO LEAVE A MESSAGE FOR THE NURSE SELECT OPTION 2, PLEASE LEAVE A MESSAGE INCLUDING: YOUR NAME DATE OF BIRTH CALL BACK NUMBER REASON FOR CALL**this is important as we prioritize the call backs  YOU WILL RECEIVE A CALL BACK THE SAME DAY AS LONG AS YOU CALL BEFORE 4:00 PM   Do the following things EVERYDAY: Weigh yourself in the morning before breakfast. Write it down and keep it in a log. Take your medicines as prescribed Eat low salt foods--Limit salt (sodium) to 2000 mg per day.  Stay as active as you can everyday Limit all fluids for the day to less than 2 liters   At the [facility], you and your health needs are our priority. As part of our continuing mission to provide you with exceptional heart care, we have created designated Provider Care Teams. These Care Teams include your primary Cardiologist (physician) and Advanced Practice Providers (APPs- Physician Assistants and Nurse Practitioners) who all work together to provide you with the care you  need, when you need it.   You may see any of the following providers on your designated Care Team at your next follow up: Dr Glori Bickers Dr Haynes Kerns, NP Lyda Jester, [place] Audry Riles, PharmD   Please be sure to bring in all your medications bottles to every appointment.

## 2021-09-02 NOTE — Progress Notes (Signed)
PCP: Dr. [name] HF Cardiology: Dr. [name]  76 y.o. with history of chronic diastolic CHF, mitral regurgitation, CVA, and CKD returns for followup of CHF.   Patient was initially admitted with diastolic CHF in 2/87. Echo showed EF 60-65% with grade 3 diastolic dysfunction and moderate MR.  She had cath showing no coronary disease. 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  TEE this admission showed moderate-severe MR.  She was admitted again in 10/18 with acute on chronic diastolic CHF. Echo this admission showed EF 50-55%, severe LVH, restrictive diastolic dysfunction, severe MR, moderate-severe TR.  She was referred to Dr. [name] who saw her for mitral valve evaluation.  Upon review of studies at that time, regurgitation thought to be more in the moderate range and likely functional.   Patient had PYP amyloid scan in 12/18, this was suggestive of transthyretin amyloidosis.  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   Admission for A/C diastolic HF 8/6-7/67/2094. Required IV diuresis, then transitioned to 80 mg PO lasix BID. Metop was DC'd due to low blood pressures. Cardiac MRI strongly suggestive of amyloidosis, moderate to severe MR (moderate by regurgitant fraction 41% but severe visually). Echo repeated: EF 40-45%, moderate to severe LVH, severe MR, severe dilation of LA and RA, moderate TR, PASP 49.  She had Mitraclip x 2 placed in 5/19.  Repeat echo post-mitraclip showed EF 55-60%, moderate LVH, mean gradient 3 mmHg across the MV with trace-mild MR.   Cardiomems was placed 6/19.    Echo 6/20 showed EF 55% with moderate LVH, s/p Mitraclip with mean gradient 3 mmHg, mild MR, normal RV, PASP 41 mmHg.   Echo in 11/21 showed EF 50-55%, moderate LVH, normal RV, PASP 44 mmHg, s/p Mitraclip with mean gradient 5 mmHg with mild MR.   She returns today for followup of CHF.  Unable to pull up Cardiomems reading today as website is down.  However,  last reading from a week or so ago was optimized.   BP high in the office, but she checks regularly at home and always < 709 systolic.  Weight down 4 lbs. No dyspnea walking on flat ground.  No orthopnea/PND.  Main complaint is joint pain.  She "hurts all over" in the joints, mainly her hips and back.  At some point in the past, she was told that she needed a hip replacement.  She is walking with a walker. No chest pain.  She is now on Amvuttra and does not have significant neuropathic symptoms in her feet at this point.   Labs (10/18): K 4.4, creatinine 1.27, LDL 56, HDL 39 Labs (11/18): K 3.9, creatinine 1.43, SPEP negative Labs (2/19): K 3.8, creatinine 1.40, hgb 13.4 Labs (5/19): K 4.1, creatinine 1.4, hgb 10.8 Labs (6/19): K 4.2, creatinine 1.4, hgb 12.4 Labs (7/19): K 4, creatinine 1.38 Labs (1/20): K 3.6, creatinine 1.36 Labs (3/20): K 3.6, creatinine 1.31, LDL 107 Labs (7/20): creatinine 1.48 Labs (10/20): LDL 87 Labs (12/21): LDL 127, BNP 485 Labs (1/22): K 4.6, creatinine 1.65 Labs (6/22): K 4.1, creatinine 1.67, LDL 142 Labs (11/22): LDL 150 (was not taking Repatha), K 4.2, creatinine 1.64  PMH: 1. HTN 2. Hyperlipidemia 3. 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  LINQ monitor placed.  4. Chronic diastolic CHF: She has Cardiomems.  - LHC (6/17): Normal coronaries.  - Echo (10/18): EF 50-55%, severe LVH with speckled myocardium concerning for amyloidosis, restrictive diastolic function with septal flattening, severe  MR, moderate-severe TR, severe biatrial enlargement, PASP 54 mmHg.  - PYP amyloid scan: Grade 3, suggestive of transthyretin amyloidosis.  - Echo (09/2017): EF 40-45%, moderate to severe LVH, severe MR, severe dilation of LA and RA, moderate TR, PASP 49 - Cardiac MRI (02/19): Moderate LVH, EF 45% with diffuse hypokinesis, mildly dilated RV with mildly decreased systolic function, mod to severe MR (moderate by 41% regurgitant fraction, severe visually), severe biatrial  enlargement, LGE pattern strongly suggestive of cardiac amyloidosis - Genetic testing showed genetic TTR amyloidosis from Val142Ile.   - Echo (5/19): EF 55-60%, moderate LVH c/w amyloidosis, Mitraclip x 2 with mean MV gradient 3 mmHg, trace-mild MR, moderate TR, PASP 62 mmHg.  - Echo (6/19): EF 55-60%, moderate LVH with speckled myocardium, trivial MR s/p Mitraclip with mean gradient 2 mmHg, PASP 61.  - Echo (6/20): EF 55% with moderate LVH, s/p Mitraclip with mean gradient 3 mmHg, mild MR, normal RV, PASP 41 mmHg.  - Echo (11/21): EF 50-55%, moderate LVH, normal RV, PASP 44 mmHg, s/p Mitraclip with mean gradient 5 mmHg with mild MR. 5. Mitral regurgitation: Think mixed functional/degenerative - TEE (7/18): EF 45-50%, moderate-severe MR, mild-moderate TR.  - Echo (10/18) read as severe MR.  - 2/19 echo with severe MR.  - 2/19 cardiac MRI with moderate to severe MR.  - Mitraclip x 2 in 5/19.   - Echo 5/19 post-Mitraclip with mean MV gradient 3 mmHg, trace-mild MR.  - Echo 6/20 post-Mitraclip with mean MV gradient 3 mmHg, mild MR.  - Echo 11/21 post-Mitraclip with mean MV gradient 5 mmHg, mild MR 6. CKD stage 3 7. Hypothyroidism 8. H/o renal cell CA s/p nephrectomy.  9. Bilateral carpal tunnel releases.  10. ABIs (6/20): Normal 11. OA  Social History   Socioeconomic History   Marital status: Divorced    Spouse name: Not on file   Number of children: 5   Years of education: 12   Highest education level: Not on file  Occupational History   Occupation: retired    Fish farm manager: [employer]  Tobacco Use   Smoking status: Never   Smokeless tobacco: Never  Vaping Use   Vaping Use: Never used  Substance and Sexual Activity   Alcohol use: No   Drug use: No   Sexual activity: Not Currently  Other Topics Concern   Not on file  Social History Narrative   Lives alone in a one story home.  Her son stays with her some.  Has 5 children, 10 grandchildren and 10 great grandchildren.  Retired  Training and development officer at [employer].  Education: high school.        07/14/18- list food insecurity but is aware of food pantries and gets $15 in food stamps, reports issues with medical bills but has insurance and gets extra help through medicaid for premiums- encouraged pt to call billing to set up reasonable payment plan so it doesn't go to collections.      Left Handed   Social Determinants of Health   Financial Resource Strain: Not on file  Food Insecurity: Not on file  Transportation Needs: Not on file  Physical Activity: Not on file  Stress: Not on file  Social Connections: Not on file  Intimate Partner Violence: Not on file   Family History  Problem Relation Age of Onset   Stroke Brother    Diabetes Mellitus II Brother    Diabetes Mellitus II Sister    Breast cancer Sister    Colon cancer Sister  ROS: All systems reviewed and negative except as per HPI.   Current Outpatient Medications  Medication Sig Dispense Refill   acetaminophen (TYLENOL) 500 MG tablet Take 1,000 mg by mouth every 6 (six) hours as needed for headache (pain).     aspirin 81 MG tablet Take 1 tablet (81 mg total) daily by mouth.     butalbital-acetaminophen-caffeine (FIORICET, ESGIC) 50-325-40 MG tablet Take 1 tablet by mouth every 12 (twelve) hours as needed for headache. 14 tablet 0   carvedilol (COREG) 3.125 MG tablet Take 1 tablet (3.125 mg total) by mouth 2 (two) times daily with a meal. 120 tablet 3   clopidogrel (PLAVIX) 75 MG tablet Take 1 tablet (75 mg total) by mouth daily. 30 tablet 1   empagliflozin (JARDIANCE) 10 MG TABS tablet Take 1 tablet (10 mg total) by mouth daily before breakfast. 30 tablet 11   EPINEPHrine 0.3 mg/0.3 mL IJ SOAJ injection Inject 1 mg into the muscle as needed.     Evolocumab (REPATHA SURECLICK) 564 MG/ML SOAJ Inject 140 mg into the skin every 14 (fourteen) days. 2 mL 11   feeding supplement, ENSURE ENLIVE, (ENSURE ENLIVE) LIQD Take 237 mLs by mouth 2 (two) times daily between  meals. 237 mL 0   furosemide (LASIX) 20 MG tablet TAKE 2 TABLETS BY MOUTH IN THE MORNING AND THEN 1 TABLET EVERY EVENING 90 tablet 3   gabapentin (NEURONTIN) 300 MG capsule Take 300 mg by mouth 2 (two) times daily.     Hypromellose (ARTIFICIAL TEARS OP) Place 1 drop into both eyes daily as needed (dry eyes).     levothyroxine (SYNTHROID, LEVOTHROID) 25 MCG tablet Take 25 mcg by mouth daily before breakfast.     Na Sulfate-K Sulfate-Mg Sulf 17.5-3.13-1.6 GM/177ML SOLN See admin instructions.     potassium chloride SA (KLOR-CON) 20 MEQ tablet Take 1 tablet (20 mEq total) by mouth daily. 60 tablet 0   Tafamidis (VYNDAMAX) 61 MG CAPS TAKE 1 CAPSULE BY MOUTH ONCE A DAY 30 capsule 11   Vitamin A 2400 MCG (8000 UT) CAPS Take by mouth.     Vitamin A 3 MG (10000 UT) TABS 1 tablet     vutrisiran sodium (AMVUTTRA) 25 MG/0.5ML syringe Inject 0.5 mLs (25 mg total) into the skin every 3 (three) months. 0.5 mL 3   No current facility-administered medications for this encounter.   BP (!) 148/78    Pulse 87    Ht 5\' 4"  (1.626 m)    Wt 68 kg (150 lb)    LMP 02/12/2013    SpO2 99%    BMI 25.75 kg/m  General: NAD Neck: No JVD, diffuse goiter noted.  Lungs: Clear to auscultation bilaterally with normal respiratory effort. CV: Nondisplaced PMI.  Heart regular S1/S2, no S3/S4, no murmur.  No peripheral edema.  No carotid bruit.  Normal pedal pulses.  Abdomen: Soft, nontender, no hepatosplenomegaly, no distention.  Skin: Intact without lesions or rashes.  Neurologic: Alert and oriented x 3.  Psych: Normal affect. Extremities: No clubbing or cyanosis.  HEENT: Normal.   Assessment/Plan: 1. Chronic diastolic CHF: Normal EF on 10/18 echo with severe LVH and speckled myocardium consistent with cardiac amyloidosis.  SPEP was negative. 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 Cardiac MRI also strongly suggestive  of cardiac amyloidosis. NYHA class II.  She is not volume overloaded by exam.  - Continue Lasix 40 qam/20 qpm, BMET today.    -  Continue carvedilol 3.125 mg BID. - Continue Jardiance 10 mg daily.  - I will arrange for repeat echo.  2. Mitral regurgitation: Mitral regurgitation seems to fluctuate somewhat based on loading conditions.  Most recently, echo in 2/19 suggested severe MR and cardiac MRI suggested moderate to severe MR (moderate by 41% regurgitant fraction but visually severe). I suspect that the etiology is mixed - functional as well as degenerative with thickening and restriction of posterior leaflet. She is now s/p Mitraclip procedure in 5/19. Echo in 11/21 showed mean gradient 5 mmHg across the MV with Mitraclip in place, MR is mild.  - She is due for repeat echo, will arrange as above.  3. CKD: Stage III.  BMET today.  4. CVA: 7/18. Has LINQ monitor, no atrial fibrillation detected so far.  - Continue ASA 81.   - Continue Plavix. 5. 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  Neuropathy is FAP 1 and PND 1.  Neuropathic pain has improved with Amvuttra.  - Continue Amvuttra.  6. Cardiac amyloidosis: Hereditary TTR amyloidosis.  She has peripheral neuropathy as well as bilateral carpal tunnel syndrome.   - She is now on tafamidis and Amvuttra, no side effects.   7. Hyperlipidemia: Goal LDL < 70 with CVA.  Cannot take statins due to rhabdomyolysis with Crestor (interaction with tafamidis).  LDL 150 in 11/22 but was not compliant at the time with Repatha, now says she is taking.   - Check lipids today.  8. Osteoarthritis: Patient hurts in multiple joints, hips are the worst.  She says that THR was recommended in the past.  - I will refer to Dr. Alphonzo Severance for orthopedic evaluation. 9. Goiter: Goiter noted on neck exam.  She has hypothyroidism and is on Levoxyl.   Followup in 4 months with APP  Loralie Champagne,  09/02/2021  11:29 PM

## 2021-09-02 NOTE — Telephone Encounter (Signed)
Patient would like for you to give her a call back.  [phone].  Please advise.

## 2021-09-03 ENCOUNTER — Other Ambulatory Visit (HOSPITAL_COMMUNITY): Payer: Self-pay

## 2021-09-03 ENCOUNTER — Other Ambulatory Visit: Payer: Self-pay | Admitting: Pharmacist Clinician (PhC)/ Clinical Pharmacy Specialist

## 2021-09-03 DIAGNOSIS — D472 Monoclonal gammopathy: Secondary | ICD-10-CM | POA: Diagnosis not present

## 2021-09-03 DIAGNOSIS — N179 Acute kidney failure, unspecified: Secondary | ICD-10-CM | POA: Diagnosis not present

## 2021-09-03 DIAGNOSIS — I5032 Chronic diastolic (congestive) heart failure: Secondary | ICD-10-CM | POA: Diagnosis not present

## 2021-09-03 DIAGNOSIS — I130 Hypertensive heart and chronic kidney disease with heart failure and stage 1 through stage 4 chronic kidney disease, or unspecified chronic kidney disease: Secondary | ICD-10-CM | POA: Diagnosis not present

## 2021-09-03 DIAGNOSIS — N183 Chronic kidney disease, stage 3 unspecified: Secondary | ICD-10-CM | POA: Diagnosis not present

## 2021-09-03 DIAGNOSIS — M6282 Rhabdomyolysis: Secondary | ICD-10-CM | POA: Diagnosis not present

## 2021-09-03 MED ORDER — EZETIMIBE 10 MG PO TABS
10.0000 mg | ORAL_TABLET | Freq: Every day | ORAL | 3 refills | Status: DC
  Filled 2021-09-03: qty 90, 90d supply, fill #0
  Filled 2021-11-30: qty 90, 90d supply, fill #1
  Filled 2022-02-01: qty 90, 90d supply, fill #2

## 2021-09-07 ENCOUNTER — Telehealth: Payer: Self-pay | Admitting: Neurology

## 2021-09-07 DIAGNOSIS — N179 Acute kidney failure, unspecified: Secondary | ICD-10-CM | POA: Diagnosis not present

## 2021-09-07 DIAGNOSIS — N183 Chronic kidney disease, stage 3 unspecified: Secondary | ICD-10-CM | POA: Diagnosis not present

## 2021-09-07 DIAGNOSIS — D472 Monoclonal gammopathy: Secondary | ICD-10-CM | POA: Diagnosis not present

## 2021-09-07 DIAGNOSIS — M6282 Rhabdomyolysis: Secondary | ICD-10-CM | POA: Diagnosis not present

## 2021-09-07 DIAGNOSIS — I5032 Chronic diastolic (congestive) heart failure: Secondary | ICD-10-CM | POA: Diagnosis not present

## 2021-09-07 DIAGNOSIS — I130 Hypertensive heart and chronic kidney disease with heart failure and stage 1 through stage 4 chronic kidney disease, or unspecified chronic kidney disease: Secondary | ICD-10-CM | POA: Diagnosis not present

## 2021-09-07 NOTE — Telephone Encounter (Signed)
That BP is ok, would have her check daily for a few days and call in her numbers.

## 2021-09-07 NOTE — Telephone Encounter (Signed)
I will share this with patient's cardiologist, Dr.McLean, for his recommendations.

## 2021-09-07 NOTE — Telephone Encounter (Signed)
Katie [name] from [facility] home health called. She  took patients BP and it was  105/59.  She has taken all her medicine and has been dizzy. She wanted to let patel know. Any questions you can call [phone]

## 2021-09-08 NOTE — Telephone Encounter (Signed)
Called Katie [name] from [facility] and left a message for a call back.

## 2021-09-09 DIAGNOSIS — N179 Acute kidney failure, unspecified: Secondary | ICD-10-CM | POA: Diagnosis not present

## 2021-09-09 DIAGNOSIS — M6282 Rhabdomyolysis: Secondary | ICD-10-CM | POA: Diagnosis not present

## 2021-09-09 DIAGNOSIS — D472 Monoclonal gammopathy: Secondary | ICD-10-CM | POA: Diagnosis not present

## 2021-09-09 DIAGNOSIS — I130 Hypertensive heart and chronic kidney disease with heart failure and stage 1 through stage 4 chronic kidney disease, or unspecified chronic kidney disease: Secondary | ICD-10-CM | POA: Diagnosis not present

## 2021-09-09 DIAGNOSIS — I5032 Chronic diastolic (congestive) heart failure: Secondary | ICD-10-CM | POA: Diagnosis not present

## 2021-09-09 DIAGNOSIS — N183 Chronic kidney disease, stage 3 unspecified: Secondary | ICD-10-CM | POA: Diagnosis not present

## 2021-09-09 NOTE — Telephone Encounter (Signed)
Called Sharee Pimple from [facility] and left a message for a call back.

## 2021-09-10 ENCOUNTER — Other Ambulatory Visit (HOSPITAL_COMMUNITY): Payer: Self-pay

## 2021-09-10 NOTE — Telephone Encounter (Signed)
Called Sharee Pimple and was unable to leave a message due to her mailbox being full. I am closing encounter as this is my 3rd attempt to contact Robert Lee from [facility].

## 2021-09-15 ENCOUNTER — Other Ambulatory Visit: Payer: Self-pay

## 2021-09-15 ENCOUNTER — Ambulatory Visit (HOSPITAL_COMMUNITY)
Admission: RE | Admit: 2021-09-15 | Discharge: 2021-09-15 | Disposition: A | Discharge status: Home / Self Care | Payer: Medicare Other | Source: Ambulatory Visit | Attending: Internal Medicine | Admitting: Internal Medicine

## 2021-09-15 DIAGNOSIS — I340 Nonrheumatic mitral (valve) insufficiency: Secondary | ICD-10-CM

## 2021-09-15 DIAGNOSIS — I10 Essential (primary) hypertension: Secondary | ICD-10-CM | POA: Insufficient documentation

## 2021-09-15 DIAGNOSIS — I272 Pulmonary hypertension, unspecified: Secondary | ICD-10-CM | POA: Insufficient documentation

## 2021-09-15 DIAGNOSIS — I081 Rheumatic disorders of both mitral and tricuspid valves: Secondary | ICD-10-CM | POA: Diagnosis not present

## 2021-09-15 LAB — ECHOCARDIOGRAM COMPLETE
Area-P 1/2: 2.74 cm2
MV M vel: 4.74 m/s
MV Peak grad: 89.9 mmHg
MV VTI: 1.53 cm2
Radius: 0.5 cm
S' Lateral: 4.1 cm

## 2021-09-16 ENCOUNTER — Ambulatory Visit: Payer: Self-pay

## 2021-09-16 ENCOUNTER — Encounter: Payer: Self-pay | Admitting: Orthopedic Surgery

## 2021-09-16 ENCOUNTER — Ambulatory Visit (INDEPENDENT_AMBULATORY_CARE_PROVIDER_SITE_OTHER): Payer: Medicare Other | Admitting: Orthopedic Surgery

## 2021-09-16 DIAGNOSIS — G8929 Other chronic pain: Secondary | ICD-10-CM | POA: Diagnosis not present

## 2021-09-16 DIAGNOSIS — M25552 Pain in left hip: Secondary | ICD-10-CM

## 2021-09-16 DIAGNOSIS — M25551 Pain in right hip: Secondary | ICD-10-CM

## 2021-09-16 DIAGNOSIS — M25561 Pain in right knee: Secondary | ICD-10-CM | POA: Diagnosis not present

## 2021-09-16 DIAGNOSIS — M25562 Pain in left knee: Secondary | ICD-10-CM

## 2021-09-17 DIAGNOSIS — D472 Monoclonal gammopathy: Secondary | ICD-10-CM | POA: Diagnosis not present

## 2021-09-17 DIAGNOSIS — I5032 Chronic diastolic (congestive) heart failure: Secondary | ICD-10-CM | POA: Diagnosis not present

## 2021-09-17 DIAGNOSIS — N183 Chronic kidney disease, stage 3 unspecified: Secondary | ICD-10-CM | POA: Diagnosis not present

## 2021-09-17 DIAGNOSIS — N179 Acute kidney failure, unspecified: Secondary | ICD-10-CM | POA: Diagnosis not present

## 2021-09-17 DIAGNOSIS — M6282 Rhabdomyolysis: Secondary | ICD-10-CM | POA: Diagnosis not present

## 2021-09-17 DIAGNOSIS — I130 Hypertensive heart and chronic kidney disease with heart failure and stage 1 through stage 4 chronic kidney disease, or unspecified chronic kidney disease: Secondary | ICD-10-CM | POA: Diagnosis not present

## 2021-09-20 ENCOUNTER — Encounter: Payer: Self-pay | Admitting: Orthopedic Surgery

## 2021-09-20 NOTE — Progress Notes (Signed)
Office Visit Note   Patient: Katie [name]           Date of Birth: Apr 12, 1946           MRN: [id number] Visit Date: 09/16/2021 Requested by: Larey Dresser, MD [phone],  [place] [address] PCP: Seward Carol, MD  Subjective: Chief Complaint  Patient presents with   Left Hip - Pain   Right Hip - Pain    HPI: Katie [name] is a 76 y.o. female who presents to the office complaining of bilateral hip pain.  Patient states that she has had years of pain in both hips but worse pain in the last year since a hospitalization due to amyloidosis.  She localizes pain to the posterior aspect of both hips with the right hip bothering her more than the left..  She denies any groin pain, numbness/tingling, radicular pain down the leg but she does have occasional radiation into the anterior thigh in both legs.  She has a history of low back pain but has never had her back pain evaluated or worked up.  No previous injections or MRIs of either hip.  She walks with a walker and takes Tylenol arthritis for her pain.  She cannot take NSAIDs due to her cardiac history.  She has history of CHF with last echocardiogram yesterday.  No history of diabetes or smoking.  She only has 1 kidney but states that her kidney function is relatively stable and does not require dialysis.  No history of previous surgery to either hip.  She also describes bilateral knee pain that bothers her.  No recent injuries.  No mechanical symptoms or instability to either knee.              ROS: All systems reviewed are negative as they relate to the chief complaint within the history of present illness.  Patient denies fevers or chills.  Assessment & Plan: Visit Diagnoses:  1. Pain in right hip   2. Chronic pain of both knees   3. Bilateral hip pain     Plan: Patient is a 76 year old female who presents for evaluation of bilateral hip pain primarily.  She has history of several years of hip pain but pain has  been progressively worse over the last year.  She is able to ambulate with a walker.  Radiographs show severe end-stage osteoarthritis of both hip joints.  Discussed the options available to patient including doing nothing versus fluoroscopic guided cortisone injections versus total hip arthroplasty.  With her significant medical history, she wants to avoid any surgical intervention if she can.  She would like to try bilateral hip injections which are not likely to yield a lot of lasting relief for her due to the severity of her arthritis but it is reasonable to try first and see what she can get out of an injection.  Plan to refer her to Dr. Laurence Spates for bilateral hip intra-articular injection.  Also with her knee pain today, bilateral knee injections were administered and patient tolerated the procedures well.  Follow-up with the office as needed.  Follow-Up Instructions: No follow-ups on file.   Orders:  Orders Placed This Encounter  Procedures   XR HIP UNILAT W OR W/O PELVIS 2-3 VIEWS RIGHT   XR KNEE 3 VIEW LEFT   XR KNEE 3 VIEW RIGHT   Ambulatory referral to Physical Medicine Rehab   No orders of the defined types were placed in this  encounter.     Procedures: Large Joint Inj: bilateral knee on 09/16/2021 8:17 PM Indications: diagnostic evaluation, joint swelling and pain Details: 18 G 1.5 in needle, superolateral approach  Arthrogram: No  Medications (Right): 5 mL lidocaine 1 %; 4 mL bupivacaine 0.25 %; 40 mg methylPREDNISolone acetate 40 MG/ML Medications (Left): 5 mL lidocaine 1 %; 4 mL bupivacaine 0.25 %; 40 mg methylPREDNISolone acetate 40 MG/ML Outcome: tolerated well, no immediate complications Procedure, treatment alternatives, risks and benefits explained, specific risks discussed. Consent was given by the patient. Immediately prior to procedure a time out was called to verify the correct patient, procedure, equipment, support staff and site/side marked as required. Patient  was prepped and draped in the usual sterile fashion.      Clinical Data: No additional findings.  Objective: Vital Signs: LMP 02/12/2013   Physical Exam:  Constitutional: Patient appears well-developed HEENT:  Head: Normocephalic Eyes:EOM are normal Neck: Normal range of motion Cardiovascular: Normal rate Pulmonary/chest: Effort normal Neurologic: Patient is alert Skin: Skin is warm Psychiatric: Patient has normal mood and affect  Ortho Exam: Ortho exam demonstrates bilateral knees with 0 degrees extension and greater than 90 degrees of knee flexion.  Mild tenderness over the medial lateral joint lines of both knees.  Patient has excellent quadricep strength rated 5/5.  5/5 motor strength of bilateral hip flexion, quadricep, hamstring, dorsiflexion, plantarflexion.  Increased pain with hip range of motion bilaterally.  Positive Stinchfield sign bilaterally.  Negative straight leg raise bilaterally.  Specialty Comments:  No specialty comments available.  Imaging: No results found.   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[place])    Myopericarditis 09/06/2017   Non-rheumatic mitral regurgitation    Chronic diastolic congestive heart failure ([place]) 05/08/2017   CHF exacerbation ([place]) 05/08/2017   Chronic migraine without aura without status migrainosus, not intractable    Hypertension 02/15/2017   Basilar artery occlusion    Cerebellar  stroke ([place]) 02/10/2017   Headache 02/10/2017   Chest pain    Mitral regurgitation 01/08/2016   Moderate tricuspid regurgitation 01/08/2016   Pulmonary HTN ([place]) 01/08/2016   Acute CHF (congestive heart failure) ([place]) 01/07/2016   Elevated transaminase level 01/07/2016   Mild hypertension 01/07/2016   CKD (chronic kidney disease), stage III ([place]) 01/07/2016   Hyperlipidemia 01/07/2016   Acute CHF ([place]) 01/07/2016   Past Medical History:  Diagnosis Date   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 s/p right nephrectomy ~2007.   History of loop recorder    Hypercholesterolemia    Hypertension    Hypothyroidism    Mitral regurgitation    Pulmonary hypertension ([place])    Stroke ([place])  no residual    Family History  Problem Relation Age of Onset   Stroke Brother    Diabetes Mellitus II Brother    Diabetes Mellitus II Sister    Breast cancer Sister    Colon cancer Sister     Past Surgical History:  Procedure Laterality Date   BREAST EXCISIONAL BIOPSY Left    BREAST EXCISIONAL BIOPSY Right    CARDIAC CATHETERIZATION N/A 01/12/2016   Procedure: Right/Left Heart Cath and Coronary/Graft Angiography;  Surgeon: Belva Crome, MD;  Location: [facility];  Service: Cardiovascular;  Laterality: N/A;   CERVICAL POLYPECTOMY N/A 04/23/2013   Procedure: CERVICAL POLYPECTOMY;  Surgeon: Maeola Sarah. Landry Mellow, MD;  Location: [facility];  Service: Gynecology;  Laterality: N/A;  Possible Polypectomy   COLONOSCOPY WITH PROPOFOL N/A 12/19/2020   Procedure: COLONOSCOPY WITH PROPOFOL;  Surgeon: Carol Ada, MD;  Location: [facility];  Service: Endoscopy;  Laterality: N/A;   HYSTEROSCOPY WITH D & C N/A 04/23/2013   Procedure: DILATATION AND CURETTAGE  /HYSTEROSCOPY;  Surgeon: Maeola Sarah. Landry Mellow, MD;  Location: [facility];  Service: Gynecology;  Laterality: N/A;   IR ANGIO INTRA EXTRACRAN SEL COM CAROTID INNOMINATE BILAT MOD SED  02/11/2017   IR ANGIO VERTEBRAL SEL VERTEBRAL BILAT MOD SED  02/11/2017   KNEE ARTHROSCOPY Left    LOOP RECORDER INSERTION N/A 02/18/2017   Procedure: Loop Recorder Insertion;  Surgeon: Constance Haw, MD;  Location: [facility];  Service: Cardiovascular;  Laterality: N/A;   MITRAL VALVE REPAIR  11/30/2017   MITRAL VALVE REPAIR N/A 11/30/2017   Procedure: MITRAL VALVE REPAIR;  Surgeon: Sherren Mocha, MD;  Location: [facility];  Service: Cardiovascular;  Laterality: N/A;   NEPHRECTOMY Right    due to carcinoma   POLYPECTOMY  12/19/2020   Procedure: POLYPECTOMY;  Surgeon: Carol Ada, MD;  Location: [facility];  Service: Endoscopy;;   TEE WITHOUT CARDIOVERSION N/A 02/18/2017   Procedure: TRANSESOPHAGEAL ECHOCARDIOGRAM (TEE) WITH LOOP;  Surgeon: Thayer Headings, MD;  Location: [facility];  Service: Cardiovascular;  Laterality: N/A;   Social History   Occupational History   Occupation: retired    Employer: [employer]  Tobacco Use   Smoking status: Never   Smokeless tobacco: Never  Vaping Use   Vaping Use: Never used  Substance and Sexual Activity   Alcohol use: No   Drug use: No   Sexual activity: Not Currently

## 2021-09-21 ENCOUNTER — Telehealth (HOSPITAL_COMMUNITY): Payer: Self-pay | Admitting: Pharmacist

## 2021-09-21 ENCOUNTER — Other Ambulatory Visit (HOSPITAL_COMMUNITY): Payer: Self-pay

## 2021-09-21 MED ORDER — BUPIVACAINE HCL 0.25 % IJ SOLN
4.0000 mL | INTRAMUSCULAR | Status: AC | PRN
  Administered 2021-09-16: 4 mL via INTRA_ARTICULAR

## 2021-09-21 MED ORDER — METHYLPREDNISOLONE ACETATE 40 MG/ML IJ SUSP
40.0000 mg | INTRAMUSCULAR | Status: AC | PRN
  Administered 2021-09-16: 40 mg via INTRA_ARTICULAR

## 2021-09-21 MED ORDER — LIDOCAINE HCL 1 % IJ SOLN
5.0000 mL | INTRAMUSCULAR | Status: AC | PRN
  Administered 2021-09-16: 5 mL

## 2021-09-21 NOTE — Telephone Encounter (Signed)
Called an confirmed pharmacy clinic appointment for Amvuttra injection 09/30/21 at 1pm. Patient's daughter will take her to and from appointment.    Audry Riles, PharmD, BCPS, BCCP, CPP Heart Failure Clinic Pharmacist [phone]

## 2021-09-22 ENCOUNTER — Other Ambulatory Visit (HOSPITAL_COMMUNITY): Payer: Self-pay

## 2021-09-22 ENCOUNTER — Other Ambulatory Visit (HOSPITAL_COMMUNITY): Payer: Self-pay | Admitting: Cardiology

## 2021-09-23 DIAGNOSIS — N179 Acute kidney failure, unspecified: Secondary | ICD-10-CM | POA: Diagnosis not present

## 2021-09-23 DIAGNOSIS — I5032 Chronic diastolic (congestive) heart failure: Secondary | ICD-10-CM | POA: Diagnosis not present

## 2021-09-23 DIAGNOSIS — I130 Hypertensive heart and chronic kidney disease with heart failure and stage 1 through stage 4 chronic kidney disease, or unspecified chronic kidney disease: Secondary | ICD-10-CM | POA: Diagnosis not present

## 2021-09-23 DIAGNOSIS — D472 Monoclonal gammopathy: Secondary | ICD-10-CM | POA: Diagnosis not present

## 2021-09-23 DIAGNOSIS — M6282 Rhabdomyolysis: Secondary | ICD-10-CM | POA: Diagnosis not present

## 2021-09-23 DIAGNOSIS — N183 Chronic kidney disease, stage 3 unspecified: Secondary | ICD-10-CM | POA: Diagnosis not present

## 2021-09-25 ENCOUNTER — Other Ambulatory Visit (HOSPITAL_COMMUNITY): Payer: Self-pay

## 2021-09-28 ENCOUNTER — Other Ambulatory Visit (HOSPITAL_COMMUNITY): Payer: Self-pay

## 2021-09-28 DIAGNOSIS — I5032 Chronic diastolic (congestive) heart failure: Secondary | ICD-10-CM | POA: Diagnosis not present

## 2021-09-28 DIAGNOSIS — M6282 Rhabdomyolysis: Secondary | ICD-10-CM | POA: Diagnosis not present

## 2021-09-28 DIAGNOSIS — N179 Acute kidney failure, unspecified: Secondary | ICD-10-CM | POA: Diagnosis not present

## 2021-09-28 DIAGNOSIS — N183 Chronic kidney disease, stage 3 unspecified: Secondary | ICD-10-CM | POA: Diagnosis not present

## 2021-09-28 DIAGNOSIS — D472 Monoclonal gammopathy: Secondary | ICD-10-CM | POA: Diagnosis not present

## 2021-09-28 DIAGNOSIS — I130 Hypertensive heart and chronic kidney disease with heart failure and stage 1 through stage 4 chronic kidney disease, or unspecified chronic kidney disease: Secondary | ICD-10-CM | POA: Diagnosis not present

## 2021-09-29 NOTE — Progress Notes (Signed)
PCP: Dr. [name] ?HF Cardiology: Dr. [name] ? ?HPI:  ?76 y.o. with history of chronic diastolic CHF, mitral regurgitation, CVA, TTR amyloid, and CKD .  ?  ?Patient was initially admitted with diastolic CHF in 01/4127. Echo showed EF 60-65% with grade 3 diastolic dysfunction and moderate MR.  She had cath showing no coronary disease. She was admitted in 01/2017 with left cerebellar CVA and LINQ monitor was placed.  TEE this admission showed moderate-severe MR.  She was admitted again in 05/2017 with acute on chronic diastolic CHF. Echo this admission showed EF 50-55%, severe LVH, restrictive diastolic dysfunction, severe MR, moderate-severe TR.  She was referred to Dr. [name] who saw her for mitral valve evaluation.  Upon review of studies at that time, regurgitation thought to be more in the moderate range and likely functional.  ?  ?Patient had PYP amyloid scan in 07/2017, this was suggestive of transthyretin amyloidosis.  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  ?  ?Admission for A/C diastolic HF 7/8-6/76/7209. Required IV diuresis, then transitioned to 80 mg PO Lasix BID. Metoprolol was DC'd due to low blood pressures. Cardiac MRI strongly suggestive of amyloidosis, moderate to severe MR (moderate by regurgitant fraction 41% but severe visually). Echo repeated: EF 40-45%, moderate to severe LVH, severe MR, severe dilation of LA and RA, moderate TR, PASP 49. ?  ?She had Mitraclip x 2 placed in 11/2017.  Repeat echo post-mitraclip showed EF 55-60%, moderate LVH, mean gradient 3 mmHg across the MV with trace-mild MR.  ?  ?Cardiomems was placed 12/2017.   ?  ?Echo 01/2019 showed EF 55% with moderate LVH, s/p Mitraclip with mean gradient 3 mmHg, mild MR, normal RV, PASP 41 mmHg.  ?  ?Echo in 06/2020 showed EF 50-55%, moderate LVH, normal RV, PASP 44 mmHg, s/p Mitraclip with mean gradient 5 mmHg with mild MR.  ?  ? ?Today she returns to HF clinic for administration of Amvuttra (vutrisiran). Last  Amvuttra injection was 06/30/22.  Overall feeling well today. Notes moderate arm pain after last Amvuttra injection that persisted for a few weeks after. Tylenol did not help the pain. States that the neuropathy in her hands has improved since last injection. No contraindications to injection today.  ? ? ?Assessment/Plan: ?1. Cardiac amyloidosis: Hereditary TTR amyloidosis.  She has peripheral neuropathy as well as bilateral carpal tunnel syndrome. Normal EF on 05/2017 echo with severe LVH and speckled myocardium consistent with cardiac amyloidosis.  SPEP was negative. 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 Cardiac MRI also strongly suggestive of cardiac amyloidosis.   ?- She is now on tafamidis, no side effects.  Receives from [employer]. ?- Amvuttra (vutrisiran) injection administered in clinic today. Patient tolerated injection well. Provided patient counseling on Amvuttra. Most common side effects are injection site reactions, arthralgias and dyspnea. Patient is aware to return to clinic every 3 months for repeat injection. Amvuttra will be obtained from [facility] and couriered to clinic for injection.  ?- Continue vitamin A supplement 8000 IU daily. Amvuttra decreases serum vitamin A levels. ?2. 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  Neuropathy is FAP 1 and PND 1.  ?- Continue Amvuttra as above  ? ?Follow up 3 months with Pharmacy Clinic for repeat Injection (12/31/21) ? ? ?Audry Riles, PharmD, BCPS, BCCP, CPP ?Heart Failure Clinic Pharmacist ?[phone] ?  ?

## 2021-09-30 ENCOUNTER — Other Ambulatory Visit: Payer: Self-pay

## 2021-09-30 ENCOUNTER — Ambulatory Visit (HOSPITAL_COMMUNITY)
Admission: RE | Admit: 2021-09-30 | Discharge: 2021-09-30 | Disposition: A | Discharge status: Home / Self Care | Payer: Medicare Other | Source: Ambulatory Visit | Attending: Internal Medicine | Admitting: Internal Medicine

## 2021-09-30 DIAGNOSIS — E850 Non-neuropathic heredofamilial amyloidosis: Secondary | ICD-10-CM | POA: Diagnosis not present

## 2021-09-30 DIAGNOSIS — I5022 Chronic systolic (congestive) heart failure: Secondary | ICD-10-CM

## 2021-09-30 DIAGNOSIS — E854 Organ-limited amyloidosis: Secondary | ICD-10-CM | POA: Diagnosis not present

## 2021-09-30 DIAGNOSIS — G990 Autonomic neuropathy in diseases classified elsewhere: Secondary | ICD-10-CM

## 2021-09-30 DIAGNOSIS — G6289 Other specified polyneuropathies: Secondary | ICD-10-CM | POA: Insufficient documentation

## 2021-09-30 DIAGNOSIS — I5032 Chronic diastolic (congestive) heart failure: Secondary | ICD-10-CM | POA: Insufficient documentation

## 2021-09-30 DIAGNOSIS — N189 Chronic kidney disease, unspecified: Secondary | ICD-10-CM | POA: Diagnosis not present

## 2021-09-30 DIAGNOSIS — D472 Monoclonal gammopathy: Secondary | ICD-10-CM

## 2021-09-30 DIAGNOSIS — G5603 Carpal tunnel syndrome, bilateral upper limbs: Secondary | ICD-10-CM | POA: Diagnosis not present

## 2021-09-30 DIAGNOSIS — Z8673 Personal history of transient ischemic attack (TIA), and cerebral infarction without residual deficits: Secondary | ICD-10-CM | POA: Insufficient documentation

## 2021-09-30 MED ORDER — VUTRISIRAN SODIUM 25 MG/0.5ML ~~LOC~~ SOSY
25.0000 mg | PREFILLED_SYRINGE | Freq: Once | SUBCUTANEOUS | Status: AC
  Administered 2021-09-30: 25 mg via SUBCUTANEOUS

## 2021-09-30 NOTE — Patient Instructions (Addendum)
It was a pleasure seeing you today! ? ?MEDICATIONS: ?-No medication changes today ?-Call if you have questions about your medications. ? ? ? ?NEXT APPOINTMENT: ?Return to clinic in 3 months on June 1st with Pharmacy Clinic for repeat injection of Amvuttra. ? ?In general, to take care of your heart failure: ?-Limit your fluid intake to 2 Liters (half-gallon) per day.   ?-Limit your salt intake to ideally 2-3 grams (2000-3000 mg) per day. ?-Weigh yourself daily and record, and bring that "weight diary" to your next appointment.  (Weight gain of 2-3 pounds in 1 day typically means fluid weight.) ?-The medications for your heart are to help your heart and help you live longer.   ?-Please contact us before stopping any of your heart medications. ? ?Call the clinic at [phone] with questions or to reschedule future appointments.  ?

## 2021-10-01 ENCOUNTER — Encounter: Payer: Self-pay | Admitting: Physical Medicine and Rehabilitation

## 2021-10-01 ENCOUNTER — Ambulatory Visit: Payer: Self-pay

## 2021-10-01 ENCOUNTER — Ambulatory Visit (INDEPENDENT_AMBULATORY_CARE_PROVIDER_SITE_OTHER): Payer: Medicare Other | Admitting: Physical Medicine and Rehabilitation

## 2021-10-01 DIAGNOSIS — M25552 Pain in left hip: Secondary | ICD-10-CM | POA: Diagnosis not present

## 2021-10-01 DIAGNOSIS — M25551 Pain in right hip: Secondary | ICD-10-CM | POA: Diagnosis not present

## 2021-10-01 MED ORDER — TRIAMCINOLONE ACETONIDE 40 MG/ML IJ SUSP
40.0000 mg | INTRAMUSCULAR | Status: AC | PRN
  Administered 2021-10-01: 40 mg via INTRA_ARTICULAR

## 2021-10-01 NOTE — Progress Notes (Signed)
Pt state bil hip pain. Pt state walking, standing and laying down makes the pain worse. Pt state she takes over the counter pain meds and pain cream to help ease his pain. ? ?Numeric Pain Rating Scale and Functional Assessment ?Average Pain 4 ? ? ?In the last MONTH (on 0-10 scale) has pain interfered with the following? ? ?1. General activity like being  able to carry out your everyday physical activities such as walking, climbing stairs, carrying groceries, or moving a chair?  ?Rating(7) ? ? ?+Driver, -BT, -Dye Allergies. ? ?

## 2021-10-01 NOTE — Progress Notes (Signed)
? ?  Katie [name] - 76 y.o. female MRN [id number]  Date of birth: 1946/04/13 ? ?Office Visit Note: ?Visit Date: 10/01/2021 ?PCP: Seward Carol, MD ?Referred by: Seward Carol, MD ? ?Subjective: ?Chief Complaint  ?Patient presents with  ? Right Hip - Pain  ? Left Hip - Pain  ? ?HPI:  Katie [name] is a 76 y.o. female who comes in today at the request of Dr. Anderson Malta for planned Bilateral anesthetic hip arthrogram with fluoroscopic guidance.  The patient has failed conservative care including home exercise, medications, time and activity modification.  This injection will be diagnostic and hopefully therapeutic.  Please see requesting physician notes for further details and justification.  ? ?ROS Otherwise per HPI. ? ?Assessment & Plan: ?Visit Diagnoses: No diagnosis found.  ?Plan: No additional findings.  ? ?Meds & Orders: No orders of the defined types were placed in this encounter. ?  ?Orders Placed This Encounter  ?Procedures  ? Large Joint Inj  ?  ?Follow-up: Return if symptoms worsen or fail to improve.  ? ?Procedures: ?Large Joint Inj: bilateral hip joint on 10/01/2021 1:16 PM ?Indications: diagnostic evaluation and pain ?Details: 22 G 3.5 in needle, fluoroscopy-guided anterior approach ? ?Arthrogram: No ? ?Medications (Right): 40 mg triamcinolone acetonide 40 MG/ML ?Medications (Left): 40 mg triamcinolone acetonide 40 MG/ML ?Outcome: tolerated well, no immediate complications ? ?There was excellent flow of contrast producing a partial arthrogram of the hip. The patient did have relief of symptoms during the anesthetic phase of the injection. ?Procedure, treatment alternatives, risks and benefits explained, specific risks discussed. Consent was given by the patient. Immediately prior to procedure a time out was called to verify the correct patient, procedure, equipment, support staff and site/side marked as required. Patient was prepped and draped in the usual sterile fashion.  ? ?  ?   ? ?Clinical  History: ?No specialty comments available.  ? ? ? ?Objective:  VS:  HT:    WT:   BMI:     BP:   HR: bpm  TEMP: ( )  RESP:  ?Physical Exam  ? ?Imaging: ?No results found. ?

## 2021-10-05 ENCOUNTER — Other Ambulatory Visit (HOSPITAL_COMMUNITY): Payer: Self-pay

## 2021-10-05 DIAGNOSIS — D472 Monoclonal gammopathy: Secondary | ICD-10-CM | POA: Diagnosis not present

## 2021-10-05 DIAGNOSIS — N179 Acute kidney failure, unspecified: Secondary | ICD-10-CM | POA: Diagnosis not present

## 2021-10-05 DIAGNOSIS — M6282 Rhabdomyolysis: Secondary | ICD-10-CM | POA: Diagnosis not present

## 2021-10-05 DIAGNOSIS — I130 Hypertensive heart and chronic kidney disease with heart failure and stage 1 through stage 4 chronic kidney disease, or unspecified chronic kidney disease: Secondary | ICD-10-CM | POA: Diagnosis not present

## 2021-10-05 DIAGNOSIS — N183 Chronic kidney disease, stage 3 unspecified: Secondary | ICD-10-CM | POA: Diagnosis not present

## 2021-10-05 DIAGNOSIS — I5032 Chronic diastolic (congestive) heart failure: Secondary | ICD-10-CM | POA: Diagnosis not present

## 2021-10-07 ENCOUNTER — Other Ambulatory Visit (HOSPITAL_COMMUNITY): Payer: Self-pay

## 2021-10-08 ENCOUNTER — Other Ambulatory Visit (HOSPITAL_COMMUNITY): Payer: Self-pay

## 2021-10-08 DIAGNOSIS — I43 Cardiomyopathy in diseases classified elsewhere: Secondary | ICD-10-CM | POA: Diagnosis not present

## 2021-10-08 DIAGNOSIS — E785 Hyperlipidemia, unspecified: Secondary | ICD-10-CM | POA: Diagnosis not present

## 2021-10-08 DIAGNOSIS — G629 Polyneuropathy, unspecified: Secondary | ICD-10-CM | POA: Diagnosis not present

## 2021-10-08 DIAGNOSIS — I5032 Chronic diastolic (congestive) heart failure: Secondary | ICD-10-CM | POA: Diagnosis not present

## 2021-10-08 DIAGNOSIS — I340 Nonrheumatic mitral (valve) insufficiency: Secondary | ICD-10-CM | POA: Diagnosis not present

## 2021-10-08 DIAGNOSIS — N390 Urinary tract infection, site not specified: Secondary | ICD-10-CM | POA: Diagnosis not present

## 2021-10-08 DIAGNOSIS — I129 Hypertensive chronic kidney disease with stage 1 through stage 4 chronic kidney disease, or unspecified chronic kidney disease: Secondary | ICD-10-CM | POA: Diagnosis not present

## 2021-10-08 DIAGNOSIS — C641 Malignant neoplasm of right kidney, except renal pelvis: Secondary | ICD-10-CM | POA: Diagnosis not present

## 2021-10-08 DIAGNOSIS — N184 Chronic kidney disease, stage 4 (severe): Secondary | ICD-10-CM | POA: Diagnosis not present

## 2021-10-08 DIAGNOSIS — E854 Organ-limited amyloidosis: Secondary | ICD-10-CM | POA: Diagnosis not present

## 2021-10-16 DIAGNOSIS — N179 Acute kidney failure, unspecified: Secondary | ICD-10-CM | POA: Diagnosis not present

## 2021-10-16 DIAGNOSIS — I130 Hypertensive heart and chronic kidney disease with heart failure and stage 1 through stage 4 chronic kidney disease, or unspecified chronic kidney disease: Secondary | ICD-10-CM | POA: Diagnosis not present

## 2021-10-16 DIAGNOSIS — D472 Monoclonal gammopathy: Secondary | ICD-10-CM | POA: Diagnosis not present

## 2021-10-16 DIAGNOSIS — M6282 Rhabdomyolysis: Secondary | ICD-10-CM | POA: Diagnosis not present

## 2021-10-16 DIAGNOSIS — I5032 Chronic diastolic (congestive) heart failure: Secondary | ICD-10-CM | POA: Diagnosis not present

## 2021-10-16 DIAGNOSIS — N183 Chronic kidney disease, stage 3 unspecified: Secondary | ICD-10-CM | POA: Diagnosis not present

## 2021-11-05 ENCOUNTER — Other Ambulatory Visit (HOSPITAL_COMMUNITY): Payer: Self-pay

## 2021-11-12 DIAGNOSIS — I43 Cardiomyopathy in diseases classified elsewhere: Secondary | ICD-10-CM | POA: Diagnosis not present

## 2021-11-12 DIAGNOSIS — E859 Amyloidosis, unspecified: Secondary | ICD-10-CM | POA: Diagnosis not present

## 2021-11-12 DIAGNOSIS — Z85528 Personal history of other malignant neoplasm of kidney: Secondary | ICD-10-CM | POA: Diagnosis not present

## 2021-11-12 DIAGNOSIS — I5042 Chronic combined systolic (congestive) and diastolic (congestive) heart failure: Secondary | ICD-10-CM | POA: Diagnosis not present

## 2021-11-12 DIAGNOSIS — E8582 Wild-type transthyretin-related (ATTR) amyloidosis: Secondary | ICD-10-CM | POA: Diagnosis not present

## 2021-11-12 DIAGNOSIS — Z905 Acquired absence of kidney: Secondary | ICD-10-CM | POA: Diagnosis not present

## 2021-11-12 DIAGNOSIS — I5043 Acute on chronic combined systolic (congestive) and diastolic (congestive) heart failure: Secondary | ICD-10-CM | POA: Diagnosis not present

## 2021-11-12 DIAGNOSIS — N1832 Chronic kidney disease, stage 3b: Secondary | ICD-10-CM | POA: Diagnosis not present

## 2021-11-12 DIAGNOSIS — I639 Cerebral infarction, unspecified: Secondary | ICD-10-CM | POA: Diagnosis not present

## 2021-11-12 DIAGNOSIS — E852 Heredofamilial amyloidosis, unspecified: Secondary | ICD-10-CM | POA: Diagnosis not present

## 2021-11-12 DIAGNOSIS — E7800 Pure hypercholesterolemia, unspecified: Secondary | ICD-10-CM | POA: Diagnosis not present

## 2021-11-12 DIAGNOSIS — I272 Pulmonary hypertension, unspecified: Secondary | ICD-10-CM | POA: Diagnosis not present

## 2021-11-16 ENCOUNTER — Other Ambulatory Visit: Payer: Self-pay | Admitting: Internal Medicine

## 2021-11-16 DIAGNOSIS — Z1231 Encounter for screening mammogram for malignant neoplasm of breast: Secondary | ICD-10-CM

## 2021-11-30 ENCOUNTER — Other Ambulatory Visit (HOSPITAL_COMMUNITY): Payer: Self-pay

## 2021-12-04 ENCOUNTER — Other Ambulatory Visit (HOSPITAL_COMMUNITY): Payer: Self-pay

## 2021-12-10 ENCOUNTER — Other Ambulatory Visit (HOSPITAL_COMMUNITY): Payer: Self-pay

## 2021-12-10 ENCOUNTER — Ambulatory Visit
Admission: RE | Admit: 2021-12-10 | Discharge: 2021-12-10 | Disposition: A | Discharge status: Home / Self Care | Payer: Medicare Other | Source: Ambulatory Visit | Attending: Internal Medicine | Admitting: Internal Medicine

## 2021-12-10 DIAGNOSIS — Z1231 Encounter for screening mammogram for malignant neoplasm of breast: Secondary | ICD-10-CM

## 2021-12-15 ENCOUNTER — Other Ambulatory Visit (HOSPITAL_COMMUNITY): Payer: Self-pay

## 2021-12-22 ENCOUNTER — Other Ambulatory Visit (HOSPITAL_COMMUNITY): Payer: Self-pay

## 2021-12-29 ENCOUNTER — Other Ambulatory Visit (HOSPITAL_COMMUNITY): Payer: Self-pay

## 2021-12-29 NOTE — Progress Notes (Signed)
PCP: Dr. [name] HF Cardiology: Dr. [name]  76 y.o. with history of chronic diastolic CHF, mitral regurgitation, CVA, and CKD returns for followup of CHF.   Patient was initially admitted with diastolic CHF in 5/63. Echo showed EF 60-65% with grade 3 diastolic dysfunction and moderate MR.  She had cath showing no coronary disease. 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  TEE this admission showed moderate-severe MR.  She was admitted again in 10/18 with acute on chronic diastolic CHF. Echo this admission showed EF 50-55%, severe LVH, restrictive diastolic dysfunction, severe MR, moderate-severe TR.  She was referred to Dr. [name] who saw her for mitral valve evaluation.  Upon review of studies at that time, regurgitation thought to be more in the moderate range and likely functional.   Patient had PYP amyloid scan in 12/18, this was suggestive of transthyretin amyloidosis.  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   Admission for A/C diastolic HF 8/7-5/64/3329. Required IV diuresis, then transitioned to 80 mg PO lasix BID. Metop was DC'd due to low blood pressures. Cardiac MRI strongly suggestive of amyloidosis, moderate to severe MR (moderate by regurgitant fraction 41% but severe visually). Echo repeated: EF 40-45%, moderate to severe LVH, severe MR, severe dilation of LA and RA, moderate TR, PASP 49.  She had Mitraclip x 2 placed in 5/19.  Repeat echo post-mitraclip showed EF 55-60%, moderate LVH, mean gradient 3 mmHg across the MV with trace-mild MR.   Cardiomems was placed 6/19.    Echo 6/20 showed EF 55% with moderate LVH, s/p Mitraclip with mean gradient 3 mmHg, mild MR, normal RV, PASP 41 mmHg.   Echo in 11/21 showed EF 50-55%, moderate LVH, normal RV, PASP 44 mmHg, s/p Mitraclip with mean gradient 5 mmHg with mild MR.   She returns today for followup of CHF.  Unable to pull up Cardiomems reading today as website is down.  However,  last reading from a week or so ago was optimized.   BP high in the office, but she checks regularly at home and always < 518 systolic.  Weight down 4 lbs. No dyspnea walking on flat ground.  No orthopnea/PND.  Main complaint is joint pain.  She "hurts all over" in the joints, mainly her hips and back.  At some point in the past, she was told that she needed a hip replacement.  She is walking with a walker. No chest pain.  She is now on Amvuttra and does not have significant neuropathic symptoms in her feet at this point.   Labs (10/18): K 4.4, creatinine 1.27, LDL 56, HDL 39 Labs (11/18): K 3.9, creatinine 1.43, SPEP negative Labs (2/19): K 3.8, creatinine 1.40, hgb 13.4 Labs (5/19): K 4.1, creatinine 1.4, hgb 10.8 Labs (6/19): K 4.2, creatinine 1.4, hgb 12.4 Labs (7/19): K 4, creatinine 1.38 Labs (1/20): K 3.6, creatinine 1.36 Labs (3/20): K 3.6, creatinine 1.31, LDL 107 Labs (7/20): creatinine 1.48 Labs (10/20): LDL 87 Labs (12/21): LDL 127, BNP 485 Labs (1/22): K 4.6, creatinine 1.65 Labs (6/22): K 4.1, creatinine 1.67, LDL 142 Labs (11/22): LDL 150 (was not taking Repatha), K 4.2, creatinine 1.64  PMH: 1. HTN 2. Hyperlipidemia 3. 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  LINQ monitor placed.  4. Chronic diastolic CHF: She has Cardiomems.  - LHC (6/17): Normal coronaries.  - Echo (10/18): EF 50-55%, severe LVH with speckled myocardium concerning for amyloidosis, restrictive diastolic function with septal flattening, severe  MR, moderate-severe TR, severe biatrial enlargement, PASP 54 mmHg.  - PYP amyloid scan: Grade 3, suggestive of transthyretin amyloidosis.  - Echo (09/2017): EF 40-45%, moderate to severe LVH, severe MR, severe dilation of LA and RA, moderate TR, PASP 49 - Cardiac MRI (02/19): Moderate LVH, EF 45% with diffuse hypokinesis, mildly dilated RV with mildly decreased systolic function, mod to severe MR (moderate by 41% regurgitant fraction, severe visually), severe biatrial  enlargement, LGE pattern strongly suggestive of cardiac amyloidosis - Genetic testing showed genetic TTR amyloidosis from Val142Ile.   - Echo (5/19): EF 55-60%, moderate LVH c/w amyloidosis, Mitraclip x 2 with mean MV gradient 3 mmHg, trace-mild MR, moderate TR, PASP 62 mmHg.  - Echo (6/19): EF 55-60%, moderate LVH with speckled myocardium, trivial MR s/p Mitraclip with mean gradient 2 mmHg, PASP 61.  - Echo (6/20): EF 55% with moderate LVH, s/p Mitraclip with mean gradient 3 mmHg, mild MR, normal RV, PASP 41 mmHg.  - Echo (11/21): EF 50-55%, moderate LVH, normal RV, PASP 44 mmHg, s/p Mitraclip with mean gradient 5 mmHg with mild MR. 5. Mitral regurgitation: Think mixed functional/degenerative - TEE (7/18): EF 45-50%, moderate-severe MR, mild-moderate TR.  - Echo (10/18) read as severe MR.  - 2/19 echo with severe MR.  - 2/19 cardiac MRI with moderate to severe MR.  - Mitraclip x 2 in 5/19.   - Echo 5/19 post-Mitraclip with mean MV gradient 3 mmHg, trace-mild MR.  - Echo 6/20 post-Mitraclip with mean MV gradient 3 mmHg, mild MR.  - Echo 11/21 post-Mitraclip with mean MV gradient 5 mmHg, mild MR 6. CKD stage 3 7. Hypothyroidism 8. H/o renal cell CA s/p nephrectomy.  9. Bilateral carpal tunnel releases.  10. ABIs (6/20): Normal 11. OA  Social History   Socioeconomic History   Marital status: Divorced    Spouse name: Not on file   Number of children: 5   Years of education: 12   Highest education level: Not on file  Occupational History   Occupation: retired    Fish farm manager: [employer]  Tobacco Use   Smoking status: Never   Smokeless tobacco: Never  Vaping Use   Vaping Use: Never used  Substance and Sexual Activity   Alcohol use: No   Drug use: No   Sexual activity: Not Currently  Other Topics Concern   Not on file  Social History Narrative   Lives alone in a one story home.  Her son stays with her some.  Has 5 children, 10 grandchildren and 10 great grandchildren.  Retired  Training and development officer at [employer].  Education: high school.        07/14/18- list food insecurity but is aware of food pantries and gets $15 in food stamps, reports issues with medical bills but has insurance and gets extra help through medicaid for premiums- encouraged pt to call billing to set up reasonable payment plan so it doesn't go to collections.      Left Handed   Social Determinants of Health   Financial Resource Strain: Not on file  Food Insecurity: Not on file  Transportation Needs: Not on file  Physical Activity: Not on file  Stress: Not on file  Social Connections: Not on file  Intimate Partner Violence: Not on file   Family History  Problem Relation Age of Onset   Stroke Brother    Diabetes Mellitus II Brother    Diabetes Mellitus II Sister    Breast cancer Sister    Colon cancer Sister  ROS: All systems reviewed and negative except as per HPI.   Current Outpatient Medications  Medication Sig Dispense Refill   acetaminophen (TYLENOL) 500 MG tablet Take 1,000 mg by mouth every 6 (six) hours as needed for headache (pain).     aspirin 81 MG tablet Take 1 tablet (81 mg total) daily by mouth.     butalbital-acetaminophen-caffeine (FIORICET, ESGIC) 50-325-40 MG tablet Take 1 tablet by mouth every 12 (twelve) hours as needed for headache. 14 tablet 0   carvedilol (COREG) 3.125 MG tablet Take 1 tablet (3.125 mg total) by mouth 2 (two) times daily with a meal. 120 tablet 3   clopidogrel (PLAVIX) 75 MG tablet Take 1 tablet (75 mg total) by mouth daily. 30 tablet 1   empagliflozin (JARDIANCE) 10 MG TABS tablet Take 1 tablet (10 mg total) by mouth daily before breakfast. 30 tablet 11   EPINEPHrine 0.3 mg/0.3 mL IJ SOAJ injection Inject 1 mg into the muscle as needed.     Evolocumab (REPATHA SURECLICK) 503 MG/ML SOAJ Inject 140 mg into the skin every 14 (fourteen) days. 2 mL 11   ezetimibe (ZETIA) 10 MG tablet Take 1 tablet (10 mg total) by mouth daily. 90 tablet 3   feeding  supplement, ENSURE ENLIVE, (ENSURE ENLIVE) LIQD Take 237 mLs by mouth 2 (two) times daily between meals. 237 mL 0   furosemide (LASIX) 20 MG tablet TAKE 2 TABLETS BY MOUTH IN THE MORNING THEN TAKE 1 TABLET BY MOUTH EVERY EVENING 90 tablet 3   gabapentin (NEURONTIN) 300 MG capsule Take 300 mg by mouth 2 (two) times daily.     Hypromellose (ARTIFICIAL TEARS OP) Place 1 drop into both eyes daily as needed (dry eyes).     levothyroxine (SYNTHROID, LEVOTHROID) 25 MCG tablet Take 25 mcg by mouth daily before breakfast.     Na Sulfate-K Sulfate-Mg Sulf 17.5-3.13-1.6 GM/177ML SOLN See admin instructions.     potassium chloride SA (KLOR-CON) 20 MEQ tablet Take 1 tablet (20 mEq total) by mouth daily. 60 tablet 0   Tafamidis (VYNDAMAX) 61 MG CAPS TAKE 1 CAPSULE BY MOUTH ONCE A DAY 30 capsule 11   Vitamin A 2400 MCG (8000 UT) CAPS Take by mouth.     Vitamin A 3 MG (10000 UT) TABS 1 tablet     vutrisiran sodium (AMVUTTRA) 25 MG/0.5ML syringe Inject 0.5 mLs (25 mg total) into the skin every 3 (three) months. 0.5 mL 3   No current facility-administered medications for this visit.   LMP 02/12/2013  General: NAD Neck: No JVD, diffuse goiter noted.  Lungs: Clear to auscultation bilaterally with normal respiratory effort. CV: Nondisplaced PMI.  Heart regular S1/S2, no S3/S4, no murmur.  No peripheral edema.  No carotid bruit.  Normal pedal pulses.  Abdomen: Soft, nontender, no hepatosplenomegaly, no distention.  Skin: Intact without lesions or rashes.  Neurologic: Alert and oriented x 3.  Psych: Normal affect. Extremities: No clubbing or cyanosis.  HEENT: Normal.   Assessment/Plan: 1. Chronic diastolic CHF: Normal EF on 10/18 echo with severe LVH and speckled myocardium consistent with cardiac amyloidosis.  SPEP was negative. 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 Cardiac MRI also strongly suggestive  of cardiac amyloidosis. NYHA class II.  She is not volume overloaded by exam.  - Continue Lasix 40 qam/20 qpm, BMET today.    - Continue carvedilol 3.125 mg BID. - Continue Jardiance 10 mg daily.  - I will arrange for  repeat echo.  2. Mitral regurgitation: Mitral regurgitation seems to fluctuate somewhat based on loading conditions.  Most recently, echo in 2/19 suggested severe MR and cardiac MRI suggested moderate to severe MR (moderate by 41% regurgitant fraction but visually severe). I suspect that the etiology is mixed - functional as well as degenerative with thickening and restriction of posterior leaflet. She is now s/p Mitraclip procedure in 5/19. Echo in 11/21 showed mean gradient 5 mmHg across the MV with Mitraclip in place, MR is mild.  - She is due for repeat echo, will arrange as above.  3. CKD: Stage III.  BMET today.  4. CVA: 7/18. Has LINQ monitor, no atrial fibrillation detected so far.  - Continue ASA 81.   - Continue Plavix. 5. 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  Neuropathy is FAP 1 and PND 1.  Neuropathic pain has improved with Amvuttra.  - Continue Amvuttra.  6. Cardiac amyloidosis: Hereditary TTR amyloidosis.  She has peripheral neuropathy as well as bilateral carpal tunnel syndrome.   - She is now on tafamidis and Amvuttra, no side effects.   7. Hyperlipidemia: Goal LDL < 70 with CVA.  Cannot take statins due to rhabdomyolysis with Crestor (interaction with tafamidis).  LDL 150 in 11/22 but was not compliant at the time with Repatha, now says she is taking.   - Check lipids today.  8. Osteoarthritis: Patient hurts in multiple joints, hips are the worst.  She says that THR was recommended in the past.  - I will refer to Dr. Alphonzo Severance for orthopedic evaluation. 9. Goiter: Goiter noted on neck exam.  She has hypothyroidism and is on Levoxyl.   Followup in 4 months with APP  Rafael Bihari,   12/29/2021 1:09 PM

## 2021-12-30 ENCOUNTER — Other Ambulatory Visit (HOSPITAL_COMMUNITY): Payer: Self-pay

## 2021-12-30 ENCOUNTER — Other Ambulatory Visit: Payer: Self-pay

## 2021-12-31 ENCOUNTER — Ambulatory Visit (HOSPITAL_COMMUNITY)
Admission: RE | Admit: 2021-12-31 | Discharge: 2021-12-31 | Disposition: A | Discharge status: Home / Self Care | Payer: Medicare Other | Source: Ambulatory Visit | Attending: Internal Medicine | Admitting: Internal Medicine

## 2021-12-31 ENCOUNTER — Encounter (HOSPITAL_COMMUNITY): Payer: Self-pay

## 2021-12-31 ENCOUNTER — Ambulatory Visit (HOSPITAL_COMMUNITY)
Admission: RE | Admit: 2021-12-31 | Discharge: 2021-12-31 | Disposition: A | Discharge status: Home / Self Care | Payer: Medicare Other | Source: Ambulatory Visit | Attending: Family Medicine | Admitting: Family Medicine

## 2021-12-31 ENCOUNTER — Other Ambulatory Visit (HOSPITAL_COMMUNITY): Payer: Self-pay

## 2021-12-31 VITALS — BP 138/80 | HR 79 | Wt 162.6 lb

## 2021-12-31 DIAGNOSIS — E785 Hyperlipidemia, unspecified: Secondary | ICD-10-CM | POA: Diagnosis not present

## 2021-12-31 DIAGNOSIS — I5032 Chronic diastolic (congestive) heart failure: Secondary | ICD-10-CM | POA: Diagnosis not present

## 2021-12-31 DIAGNOSIS — Z8673 Personal history of transient ischemic attack (TIA), and cerebral infarction without residual deficits: Secondary | ICD-10-CM | POA: Insufficient documentation

## 2021-12-31 DIAGNOSIS — G629 Polyneuropathy, unspecified: Secondary | ICD-10-CM | POA: Insufficient documentation

## 2021-12-31 DIAGNOSIS — N1831 Chronic kidney disease, stage 3a: Secondary | ICD-10-CM

## 2021-12-31 DIAGNOSIS — I340 Nonrheumatic mitral (valve) insufficiency: Secondary | ICD-10-CM | POA: Diagnosis not present

## 2021-12-31 DIAGNOSIS — D472 Monoclonal gammopathy: Secondary | ICD-10-CM

## 2021-12-31 DIAGNOSIS — G990 Autonomic neuropathy in diseases classified elsewhere: Secondary | ICD-10-CM

## 2021-12-31 DIAGNOSIS — N183 Chronic kidney disease, stage 3 unspecified: Secondary | ICD-10-CM | POA: Insufficient documentation

## 2021-12-31 DIAGNOSIS — Z7984 Long term (current) use of oral hypoglycemic drugs: Secondary | ICD-10-CM | POA: Diagnosis not present

## 2021-12-31 DIAGNOSIS — M1610 Unilateral primary osteoarthritis, unspecified hip: Secondary | ICD-10-CM | POA: Diagnosis not present

## 2021-12-31 DIAGNOSIS — E854 Organ-limited amyloidosis: Secondary | ICD-10-CM | POA: Diagnosis not present

## 2021-12-31 DIAGNOSIS — Z7982 Long term (current) use of aspirin: Secondary | ICD-10-CM | POA: Diagnosis not present

## 2021-12-31 DIAGNOSIS — E039 Hypothyroidism, unspecified: Secondary | ICD-10-CM | POA: Insufficient documentation

## 2021-12-31 DIAGNOSIS — I130 Hypertensive heart and chronic kidney disease with heart failure and stage 1 through stage 4 chronic kidney disease, or unspecified chronic kidney disease: Secondary | ICD-10-CM | POA: Diagnosis not present

## 2021-12-31 DIAGNOSIS — G5603 Carpal tunnel syndrome, bilateral upper limbs: Secondary | ICD-10-CM | POA: Diagnosis not present

## 2021-12-31 DIAGNOSIS — I43 Cardiomyopathy in diseases classified elsewhere: Secondary | ICD-10-CM | POA: Insufficient documentation

## 2021-12-31 DIAGNOSIS — Z7902 Long term (current) use of antithrombotics/antiplatelets: Secondary | ICD-10-CM | POA: Insufficient documentation

## 2021-12-31 DIAGNOSIS — M160 Bilateral primary osteoarthritis of hip: Secondary | ICD-10-CM | POA: Diagnosis not present

## 2021-12-31 DIAGNOSIS — Z79899 Other long term (current) drug therapy: Secondary | ICD-10-CM | POA: Insufficient documentation

## 2021-12-31 DIAGNOSIS — E049 Nontoxic goiter, unspecified: Secondary | ICD-10-CM | POA: Insufficient documentation

## 2021-12-31 DIAGNOSIS — E852 Heredofamilial amyloidosis, unspecified: Secondary | ICD-10-CM

## 2021-12-31 DIAGNOSIS — Z79890 Hormone replacement therapy (postmenopausal): Secondary | ICD-10-CM | POA: Insufficient documentation

## 2021-12-31 LAB — LIPID PANEL
Cholesterol: 179 mg/dL (ref 0–200)
HDL: 60 mg/dL (ref >40)
LDL Cholesterol: 103 mg/dL — ABNORMAL HIGH (ref 0–99)
Total CHOL/HDL Ratio: 3 RATIO
Triglycerides: 80 mg/dL (ref <150)
VLDL: 16 mg/dL (ref 0–40)

## 2021-12-31 LAB — BASIC METABOLIC PANEL
Anion gap: 7 (ref 5–15)
BUN: 28 mg/dL — ABNORMAL HIGH (ref 8–23)
CO2: 27 mmol/L (ref 22–32)
Calcium: 9.7 mg/dL (ref 8.9–10.3)
Chloride: 107 mmol/L (ref 98–111)
Creatinine, Ser: 1.71 mg/dL — ABNORMAL HIGH (ref 0.44–1.00)
GFR, Estimated: 31 mL/min — ABNORMAL LOW (ref >60)
Glucose, Bld: 85 mg/dL (ref 70–99)
Potassium: 4 mmol/L (ref 3.5–5.1)
Sodium: 141 mmol/L (ref 135–145)

## 2021-12-31 MED ORDER — VUTRISIRAN SODIUM 25 MG/0.5ML ~~LOC~~ SOSY
25.0000 mg | PREFILLED_SYRINGE | Freq: Once | SUBCUTANEOUS | Status: AC
  Administered 2021-12-31: 25 mg via SUBCUTANEOUS

## 2021-12-31 NOTE — Progress Notes (Signed)
PCP: Dr. [name] HF Cardiology: Dr. [name]  HPI:  77 y.o. with history of chronic diastolic CHF, mitral regurgitation, CVA, TTR amyloid, and CKD .    Patient was initially admitted with diastolic CHF in 08/8297. Echo showed EF 60-65% with grade 3 diastolic dysfunction and moderate MR.  She had cath showing no coronary disease. She was admitted in 01/2017 with left cerebellar CVA and LINQ monitor was placed.  TEE this admission showed moderate-severe MR.  She was admitted again in 05/2017 with acute on chronic diastolic CHF. Echo this admission showed EF 50-55%, severe LVH, restrictive diastolic dysfunction, severe MR, moderate-severe TR.  She was referred to Dr. [name] who saw her for mitral valve evaluation.  Upon review of studies at that time, regurgitation thought to be more in the moderate range and likely functional.    Patient had PYP amyloid scan in 07/2017, this was suggestive of transthyretin amyloidosis.  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    Admission for A/C diastolic HF 3/7-1/69/6789. Required IV diuresis, then transitioned to 80 mg PO Lasix BID. Metoprolol was DC'd due to low blood pressures. Cardiac MRI strongly suggestive of amyloidosis, moderate to severe MR (moderate by regurgitant fraction 41% but severe visually). Echo repeated: EF 40-45%, moderate to severe LVH, severe MR, severe dilation of LA and RA, moderate TR, PASP 49.   She had Mitraclip x 2 placed in 11/2017.  Repeat echo post-mitraclip showed EF 55-60%, moderate LVH, mean gradient 3 mmHg across the MV with trace-mild MR.    Cardiomems was placed 12/2017.     Echo 01/2019 showed EF 55% with moderate LVH, s/p Mitraclip with mean gradient 3 mmHg, mild MR, normal RV, PASP 41 mmHg.    Echo in 06/2020 showed EF 50-55%, moderate LVH, normal RV, PASP 44 mmHg, s/p Mitraclip with mean gradient 5 mmHg with mild MR.     Today she returns to HF clinic for administration of Amvuttra (vutrisiran). Last  Amvuttra injection was 09/30/21.  Overall feeling well today. No arm pain after her last injection. Neuropathy in her hands has improved. No contraindications to injection today.    Assessment/Plan: 1. Cardiac amyloidosis: Hereditary TTR amyloidosis.  She has peripheral neuropathy as well as bilateral carpal tunnel syndrome. Normal EF on 05/2017 echo with severe LVH and speckled myocardium consistent with cardiac amyloidosis.  SPEP was negative. 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 Cardiac MRI also strongly suggestive of cardiac amyloidosis.   - She is now on tafamidis, no side effects.  Receives from [employer]. - Amvuttra (vutrisiran) injection administered in clinic today. Patient tolerated injection well. Provided patient counseling on Amvuttra. Most common side effects are injection site reactions, arthralgias and dyspnea. Patient is aware to return to clinic every 3 months for repeat injection. Amvuttra will be obtained from [facility] and couriered to clinic for injection.  - Continue vitamin A supplement 8000 IU daily. Amvuttra decreases serum vitamin A levels. 2. 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  Neuropathy is FAP 1 and PND 1.  - Continue Amvuttra as above   Follow up 3 months with Pharmacy Clinic for repeat Injection (04/07/22)   Audry Riles, PharmD, BCPS, BCCP, CPP Heart Failure Clinic Pharmacist [phone]

## 2021-12-31 NOTE — Patient Instructions (Signed)
It was a pleasure seeing you today!  MEDICATIONS: -No medication changes today -Call if you have questions about your medications.   NEXT APPOINTMENT: Return to clinic in 3 months with Pharmacy Clinic.  In general, to take care of your heart failure: -Limit your fluid intake to 2 Liters (half-gallon) per day.   -Limit your salt intake to ideally 2-3 grams (2000-3000 mg) per day. -Weigh yourself daily and record, and bring that "weight diary" to your next appointment.  (Weight gain of 2-3 pounds in 1 day typically means fluid weight.) -The medications for your heart are to help your heart and help you live longer.   -Please contact us before stopping any of your heart medications.  Call the clinic at [phone] with questions or to reschedule future appointments.

## 2021-12-31 NOTE — Patient Instructions (Signed)
It was great to see you today! No medication changes are needed at this time.   Labs today We will only contact you if something comes back abnormal or we need to make some changes. Otherwise no news is good news!'  Your physician recommends that you schedule a follow-up appointment in: 4 months with Dr [name]  Do the following things EVERYDAY: Weigh yourself in the morning before breakfast. Write it down and keep it in a log. Take your medicines as prescribed Eat low salt foods--Limit salt (sodium) to 2000 mg per day.  Stay as active as you can everyday Limit all fluids for the day to less than 2 liters  At the [facility], you and your health needs are our priority. As part of our continuing mission to provide you with exceptional heart care, we have created designated Provider Care Teams. These Care Teams include your primary Cardiologist (physician) and Advanced Practice Providers (APPs- Physician Assistants and Nurse Practitioners) who all work together to provide you with the care you need, when you need it.   You may see any of the following providers on your designated Care Team at your next follow up: Dr Glori Bickers Dr Haynes Kerns, NP Lyda Jester, [place] [facility] [place], [place] Audry Riles, PharmD   Please be sure to bring in all your medications bottles to every appointment.

## 2022-01-05 ENCOUNTER — Other Ambulatory Visit (HOSPITAL_COMMUNITY): Payer: Self-pay | Admitting: Cardiology

## 2022-01-05 ENCOUNTER — Other Ambulatory Visit (HOSPITAL_COMMUNITY): Payer: Self-pay

## 2022-01-06 ENCOUNTER — Other Ambulatory Visit (HOSPITAL_COMMUNITY): Payer: Self-pay | Admitting: Cardiology

## 2022-01-12 DIAGNOSIS — H401131 Primary open-angle glaucoma, bilateral, mild stage: Secondary | ICD-10-CM | POA: Diagnosis not present

## 2022-01-12 DIAGNOSIS — H25813 Combined forms of age-related cataract, bilateral: Secondary | ICD-10-CM | POA: Diagnosis not present

## 2022-01-12 DIAGNOSIS — H35373 Puckering of macula, bilateral: Secondary | ICD-10-CM | POA: Diagnosis not present

## 2022-01-12 DIAGNOSIS — H353131 Nonexudative age-related macular degeneration, bilateral, early dry stage: Secondary | ICD-10-CM | POA: Diagnosis not present

## 2022-01-25 DIAGNOSIS — N1832 Chronic kidney disease, stage 3b: Secondary | ICD-10-CM | POA: Diagnosis not present

## 2022-01-25 DIAGNOSIS — E039 Hypothyroidism, unspecified: Secondary | ICD-10-CM | POA: Diagnosis not present

## 2022-01-25 DIAGNOSIS — I5032 Chronic diastolic (congestive) heart failure: Secondary | ICD-10-CM | POA: Diagnosis not present

## 2022-01-25 DIAGNOSIS — E782 Mixed hyperlipidemia: Secondary | ICD-10-CM | POA: Diagnosis not present

## 2022-01-25 DIAGNOSIS — I10 Essential (primary) hypertension: Secondary | ICD-10-CM | POA: Diagnosis not present

## 2022-01-27 ENCOUNTER — Other Ambulatory Visit (HOSPITAL_COMMUNITY): Payer: Self-pay

## 2022-01-27 MED ORDER — EMPAGLIFLOZIN 10 MG PO TABS: 10.0000 mg | ORAL_TABLET | Freq: Every day | ORAL | 3 refills | Status: DC

## 2022-01-29 ENCOUNTER — Other Ambulatory Visit (HOSPITAL_COMMUNITY): Payer: Self-pay

## 2022-02-01 ENCOUNTER — Other Ambulatory Visit (HOSPITAL_COMMUNITY): Payer: Self-pay

## 2022-02-04 ENCOUNTER — Other Ambulatory Visit (HOSPITAL_COMMUNITY): Payer: Self-pay

## 2022-02-08 ENCOUNTER — Other Ambulatory Visit (HOSPITAL_COMMUNITY): Payer: Self-pay

## 2022-02-22 DIAGNOSIS — M545 Low back pain, unspecified: Secondary | ICD-10-CM | POA: Diagnosis not present

## 2022-02-22 DIAGNOSIS — R350 Frequency of micturition: Secondary | ICD-10-CM | POA: Diagnosis not present

## 2022-02-22 DIAGNOSIS — M25551 Pain in right hip: Secondary | ICD-10-CM | POA: Diagnosis not present

## 2022-02-25 ENCOUNTER — Other Ambulatory Visit (HOSPITAL_COMMUNITY): Payer: Self-pay

## 2022-03-02 ENCOUNTER — Other Ambulatory Visit (HOSPITAL_COMMUNITY): Payer: Self-pay

## 2022-03-09 ENCOUNTER — Other Ambulatory Visit (HOSPITAL_COMMUNITY): Payer: Self-pay | Admitting: Cardiology

## 2022-03-10 ENCOUNTER — Other Ambulatory Visit (HOSPITAL_COMMUNITY): Payer: Self-pay

## 2022-03-16 ENCOUNTER — Other Ambulatory Visit (HOSPITAL_COMMUNITY): Payer: Self-pay

## 2022-03-17 ENCOUNTER — Other Ambulatory Visit: Payer: Self-pay | Admitting: Cardiology

## 2022-03-24 ENCOUNTER — Other Ambulatory Visit (HOSPITAL_COMMUNITY): Payer: Self-pay

## 2022-03-26 ENCOUNTER — Other Ambulatory Visit (HOSPITAL_COMMUNITY): Payer: Self-pay

## 2022-03-30 ENCOUNTER — Other Ambulatory Visit (HOSPITAL_COMMUNITY): Payer: Self-pay

## 2022-04-01 ENCOUNTER — Other Ambulatory Visit (HOSPITAL_COMMUNITY): Payer: Self-pay

## 2022-04-02 ENCOUNTER — Ambulatory Visit: Payer: Self-pay

## 2022-04-02 ENCOUNTER — Other Ambulatory Visit (HOSPITAL_COMMUNITY): Payer: Self-pay

## 2022-04-06 ENCOUNTER — Other Ambulatory Visit (HOSPITAL_COMMUNITY): Payer: Self-pay

## 2022-04-07 ENCOUNTER — Ambulatory Visit (HOSPITAL_COMMUNITY)
Admission: RE | Admit: 2022-04-07 | Discharge: 2022-04-07 | Disposition: A | Discharge status: Home / Self Care | Payer: Medicare Other | Source: Ambulatory Visit | Attending: Cardiology | Admitting: Cardiology

## 2022-04-07 ENCOUNTER — Other Ambulatory Visit (HOSPITAL_COMMUNITY): Payer: Medicare Other

## 2022-04-07 ENCOUNTER — Telehealth: Payer: Self-pay | Admitting: Orthopedic Surgery

## 2022-04-07 ENCOUNTER — Encounter (HOSPITAL_COMMUNITY): Payer: Self-pay | Admitting: Cardiology

## 2022-04-07 ENCOUNTER — Telehealth (HOSPITAL_COMMUNITY): Payer: Self-pay | Admitting: Pharmacist

## 2022-04-07 VITALS — BP 130/80 | HR 91 | Wt 163.2 lb

## 2022-04-07 DIAGNOSIS — I5032 Chronic diastolic (congestive) heart failure: Secondary | ICD-10-CM | POA: Diagnosis not present

## 2022-04-07 DIAGNOSIS — G5603 Carpal tunnel syndrome, bilateral upper limbs: Secondary | ICD-10-CM | POA: Diagnosis not present

## 2022-04-07 DIAGNOSIS — Z79899 Other long term (current) drug therapy: Secondary | ICD-10-CM | POA: Diagnosis not present

## 2022-04-07 DIAGNOSIS — Z8673 Personal history of transient ischemic attack (TIA), and cerebral infarction without residual deficits: Secondary | ICD-10-CM | POA: Insufficient documentation

## 2022-04-07 DIAGNOSIS — E039 Hypothyroidism, unspecified: Secondary | ICD-10-CM | POA: Insufficient documentation

## 2022-04-07 DIAGNOSIS — E785 Hyperlipidemia, unspecified: Secondary | ICD-10-CM | POA: Insufficient documentation

## 2022-04-07 DIAGNOSIS — Z79890 Hormone replacement therapy (postmenopausal): Secondary | ICD-10-CM | POA: Insufficient documentation

## 2022-04-07 DIAGNOSIS — Z7902 Long term (current) use of antithrombotics/antiplatelets: Secondary | ICD-10-CM | POA: Diagnosis not present

## 2022-04-07 DIAGNOSIS — I340 Nonrheumatic mitral (valve) insufficiency: Secondary | ICD-10-CM | POA: Diagnosis not present

## 2022-04-07 DIAGNOSIS — M170 Bilateral primary osteoarthritis of knee: Secondary | ICD-10-CM | POA: Diagnosis not present

## 2022-04-07 DIAGNOSIS — Z7984 Long term (current) use of oral hypoglycemic drugs: Secondary | ICD-10-CM | POA: Insufficient documentation

## 2022-04-07 DIAGNOSIS — G629 Polyneuropathy, unspecified: Secondary | ICD-10-CM | POA: Insufficient documentation

## 2022-04-07 DIAGNOSIS — Z85528 Personal history of other malignant neoplasm of kidney: Secondary | ICD-10-CM | POA: Diagnosis not present

## 2022-04-07 DIAGNOSIS — I5022 Chronic systolic (congestive) heart failure: Secondary | ICD-10-CM | POA: Diagnosis not present

## 2022-04-07 DIAGNOSIS — Z905 Acquired absence of kidney: Secondary | ICD-10-CM | POA: Insufficient documentation

## 2022-04-07 DIAGNOSIS — Z823 Family history of stroke: Secondary | ICD-10-CM | POA: Diagnosis not present

## 2022-04-07 DIAGNOSIS — N183 Chronic kidney disease, stage 3 unspecified: Secondary | ICD-10-CM | POA: Insufficient documentation

## 2022-04-07 DIAGNOSIS — M160 Bilateral primary osteoarthritis of hip: Secondary | ICD-10-CM | POA: Diagnosis not present

## 2022-04-07 DIAGNOSIS — E049 Nontoxic goiter, unspecified: Secondary | ICD-10-CM | POA: Insufficient documentation

## 2022-04-07 DIAGNOSIS — I130 Hypertensive heart and chronic kidney disease with heart failure and stage 1 through stage 4 chronic kidney disease, or unspecified chronic kidney disease: Secondary | ICD-10-CM | POA: Insufficient documentation

## 2022-04-07 LAB — BASIC METABOLIC PANEL
Anion gap: 11 (ref 5–15)
BUN: 25 mg/dL — ABNORMAL HIGH (ref 8–23)
CO2: 24 mmol/L (ref 22–32)
Calcium: 9.9 mg/dL (ref 8.9–10.3)
Chloride: 106 mmol/L (ref 98–111)
Creatinine, Ser: 1.65 mg/dL — ABNORMAL HIGH (ref 0.44–1.00)
GFR, Estimated: 32 mL/min — ABNORMAL LOW (ref >60)
Glucose, Bld: 86 mg/dL (ref 70–99)
Potassium: 4 mmol/L (ref 3.5–5.1)
Sodium: 141 mmol/L (ref 135–145)

## 2022-04-07 LAB — BRAIN NATRIURETIC PEPTIDE: B Natriuretic Peptide: 171.6 pg/mL — ABNORMAL HIGH (ref 0.0–100.0)

## 2022-04-07 MED ORDER — VUTRISIRAN SODIUM 25 MG/0.5ML ~~LOC~~ SOSY
25.0000 mg | PREFILLED_SYRINGE | Freq: Once | SUBCUTANEOUS | Status: AC
  Administered 2022-04-07: 25 mg via SUBCUTANEOUS

## 2022-04-07 MED ORDER — PANTOPRAZOLE SODIUM 40 MG PO TBEC: 40.0000 mg | DELAYED_RELEASE_TABLET | Freq: Every day | ORAL | 3 refills | Status: DC

## 2022-04-07 NOTE — Telephone Encounter (Signed)
She is okay to be set up to see Dr. Ernestina Patches or Dr. Rolena Infante, whoever can work her in sooner or whoever she prefers.

## 2022-04-07 NOTE — Telephone Encounter (Addendum)
Amvuttra (vutrisiran) injection administered in clinic visit today with Dr. [name]. Amvuttra given in Left arm. [place] [id number]; Lot [id number]; exp date 06/2023.   Patient tolerated injection well. Repeat injection scheduled for 07/07/22.   Audry Riles, PharmD, BCPS, BCCP, CPP Heart Failure Clinic Pharmacist [phone]

## 2022-04-07 NOTE — Progress Notes (Signed)
PCP: Dr. [name] HF Cardiology: Dr. [name]  76 y.o. with history of chronic diastolic CHF, mitral regurgitation, CVA, and CKD returns for followup of CHF.   Patient was initially admitted with diastolic CHF in 0/86. Echo showed EF 60-65% with grade 3 diastolic dysfunction and moderate MR.  She had cath showing no coronary disease. 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  TEE this admission showed moderate-severe MR.  She was admitted again in 10/18 with acute on chronic diastolic CHF. Echo this admission showed EF 50-55%, severe LVH, restrictive diastolic dysfunction, severe MR, moderate-severe TR.  She was referred to Dr. [name] who saw her for mitral valve evaluation.  Upon review of studies at that time, regurgitation thought to be more in the moderate range and likely functional.   Patient had PYP amyloid scan in 12/18, this was suggestive of transthyretin amyloidosis.  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   Admission for A/C diastolic HF 5/7-8/46/9629. Required IV diuresis, then transitioned to 80 mg PO lasix BID. Metop was DC'd due to low blood pressures. Cardiac MRI strongly suggestive of amyloidosis, moderate to severe MR (moderate by regurgitant fraction 41% but severe visually). Echo repeated: EF 40-45%, moderate to severe LVH, severe MR, severe dilation of LA and RA, moderate TR, PASP 49.  She had Mitraclip x 2 placed in 5/19.  Repeat echo post-mitraclip showed EF 55-60%, moderate LVH, mean gradient 3 mmHg across the MV with trace-mild MR.   Cardiomems was placed 6/19.    Echo 6/20 showed EF 55% with moderate LVH, s/p Mitraclip with mean gradient 3 mmHg, mild MR, normal RV, PASP 41 mmHg.   Echo in 11/21 showed EF 50-55%, moderate LVH, normal RV, PASP 44 mmHg, s/p Mitraclip with mean gradient 5 mmHg with mild MR.   Follow up 2/23, no significant neuropathic symptoms in feet on Amvuttra. Echo 2/23 showed EF 55-60%, mild LVH,  mild to moderate MR post Mitraclip.  Today she returns for HF follow up with her daughter.  Main complaint remains pain in hips and knees, arthritis.  Balance is poor, uses walker.  Now on Amvuttra, does not seem to have much neuropathic pain today.  Short of breath walking "long distances."  She gets some burning in her chest after eating, not with exertion.  No orthopnea/PND.   Cardiomems ready today 17, goal 19.  Labs (10/18): K 4.4, creatinine 1.27, LDL 56, HDL 39 Labs (11/18): K 3.9, creatinine 1.43, SPEP negative Labs (2/19): K 3.8, creatinine 1.40, hgb 13.4 Labs (5/19): K 4.1, creatinine 1.4, hgb 10.8 Labs (6/19): K 4.2, creatinine 1.4, hgb 12.4 Labs (7/19): K 4, creatinine 1.38 Labs (1/20): K 3.6, creatinine 1.36 Labs (3/20): K 3.6, creatinine 1.31, LDL 107 Labs (7/20): creatinine 1.48 Labs (10/20): LDL 87 Labs (12/21): LDL 127, BNP 485 Labs (1/22): K 4.6, creatinine 1.65 Labs (6/22): K 4.1, creatinine 1.67, LDL 142 Labs (11/22): LDL 150 (was not taking Repatha), K 4.2, creatinine 1.64 Labs (2/23): K 4.0, creatinine 1.68, LDL 110, HDL 55 Labs (6/23): K 4, creatinine 1.71, LDL 103  ECG (personally reviewed): NSR, poor RWP  PMH: 1. HTN 2. Hyperlipidemia 3. 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  LINQ monitor placed.  4. Chronic diastolic CHF: She has Cardiomems.  - LHC (6/17): Normal coronaries.  - Echo (10/18): EF 50-55%, severe LVH with speckled myocardium concerning for amyloidosis, restrictive diastolic function with septal flattening, severe MR, moderate-severe TR, severe biatrial enlargement, PASP 54  mmHg.  - PYP amyloid scan: Grade 3, suggestive of transthyretin amyloidosis.  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   - Echo (5/19): EF 55-60%, moderate LVH c/w amyloidosis, Mitraclip x 2 with mean MV gradient 3 mmHg, trace-mild MR, moderate TR, PASP 62 mmHg.  - Echo (6/19): EF 55-60%, moderate LVH with speckled myocardium, trivial MR s/p Mitraclip with mean gradient 2 mmHg, PASP 61.  - Echo (6/20): EF 55% with moderate LVH, s/p Mitraclip with mean gradient 3 mmHg, mild MR, normal RV, PASP 41 mmHg.  - Echo (11/21): EF 50-55%, moderate LVH, normal RV, PASP 44 mmHg, s/p Mitraclip with mean gradient 5 mmHg with mild MR. - Echo (2/23): EF 55-60%, mild LVH, mild to moderate MR post Mitraclip. 5. Mitral regurgitation: Think mixed functional/degenerative - TEE (7/18): EF 45-50%, moderate-severe MR, mild-moderate TR.  - Echo (10/18) read as severe MR.  - 2/19 echo with severe MR.  - 2/19 cardiac MRI with moderate to severe MR.  - Mitraclip x 2 in 5/19.   - Echo 5/19 post-Mitraclip with mean MV gradient 3 mmHg, trace-mild MR.  - Echo 6/20 post-Mitraclip with mean MV gradient 3 mmHg, mild MR.  - Echo 11/21 post-Mitraclip with mean MV gradient 5 mmHg, mild MR 6. CKD stage 3 7. Hypothyroidism 8. H/o renal cell CA s/p nephrectomy.  9. Bilateral carpal tunnel releases.  10. ABIs (6/20): Normal 11. OA  Social History   Socioeconomic History   Marital status: Divorced    Spouse name: Not on file   Number of children: 5   Years of education: 12   Highest education level: Not on file  Occupational History   Occupation: retired    Fish farm manager: [employer]  Tobacco Use   Smoking status: Never   Smokeless tobacco: Never  Vaping Use   Vaping Use: Never used  Substance and Sexual Activity   Alcohol use: No   Drug use: No   Sexual activity: Not Currently  Other Topics Concern   Not on file  Social History Narrative   Lives alone in a one story  home.  Her son stays with her some.  Has 5 children, 10 grandchildren and 10 great grandchildren.  Retired Training and development officer at [employer].  Education: high school.        07/14/18- list food insecurity but is aware of food pantries and gets $15 in food stamps, reports issues with medical bills but has insurance and gets extra help through medicaid for premiums- encouraged pt to call billing to set up reasonable payment plan so it doesn't go to collections.      Left Handed   Social Determinants of Health   Financial Resource Strain: Low Risk  (09/04/2019)   Overall Financial Resource Strain (CARDIA)    Difficulty of Paying Living Expenses: Not hard at all  Food Insecurity: No Food Insecurity (09/04/2019)   Hunger Vital Sign    Worried About Running Out of Food in the Last Year: Never true    Ran Out of Food in the Last Year: Never true  Transportation Needs: No Transportation Needs (09/04/2019)   PRAPARE - Transportation  Lack of Transportation (Medical): No    Lack of Transportation (Non-Medical): No  Physical Activity: Not on file  Stress: Not on file  Social Connections: Not on file  Intimate Partner Violence: Not At Risk (06/21/2019)   Humiliation, Afraid, Rape, and Kick questionnaire    Fear of Current or Ex-Partner: No    Emotionally Abused: No    Physically Abused: No    Sexually Abused: No   Family History  Problem Relation Age of Onset   Stroke Brother    Diabetes Mellitus II Brother    Diabetes Mellitus II Sister    Breast cancer Sister    Colon cancer Sister    ROS: All systems reviewed and negative except as per HPI.   Current Outpatient Medications  Medication Sig Dispense Refill   acetaminophen (TYLENOL) 500 MG tablet Take 1,000 mg by mouth every 6 (six) hours as needed for headache (pain).     aspirin 81 MG tablet Take 1 tablet (81 mg total) daily by mouth.     butalbital-acetaminophen-caffeine (FIORICET, ESGIC) 50-325-40 MG tablet Take 1 tablet by mouth every 12  (twelve) hours as needed for headache. 14 tablet 0   carvedilol (COREG) 3.125 MG tablet TAKE 1 TABLET(3.125 MG) BY MOUTH TWICE DAILY WITH A MEAL 120 tablet 3   clopidogrel (PLAVIX) 75 MG tablet Take 1 tablet (75 mg total) by mouth daily. 30 tablet 1   empagliflozin (JARDIANCE) 10 MG TABS tablet Take 1 tablet (10 mg total) by mouth daily before breakfast. 90 tablet 3   EPINEPHrine 0.3 mg/0.3 mL IJ SOAJ injection Inject 1 mg into the muscle as needed.     ezetimibe (ZETIA) 10 MG tablet Take 1 tablet (10 mg total) by mouth daily. 90 tablet 3   feeding supplement, ENSURE ENLIVE, (ENSURE ENLIVE) LIQD Take 237 mLs by mouth 2 (two) times daily between meals. 237 mL 0   furosemide (LASIX) 20 MG tablet TAKE 2 TABLETS BY MOUTH IN THE MORNING THEN TAKE 1 TABLET BY MOUTH EVERY EVENING 90 tablet 3   gabapentin (NEURONTIN) 300 MG capsule Take 300 mg by mouth 2 (two) times daily.     Hypromellose (ARTIFICIAL TEARS OP) Place 1 drop into both eyes daily as needed (dry eyes).     levothyroxine (SYNTHROID, LEVOTHROID) 25 MCG tablet Take 25 mcg by mouth daily before breakfast.     pantoprazole (PROTONIX) 40 MG tablet Take 1 tablet (40 mg total) by mouth daily. 90 tablet 3   potassium chloride SA (KLOR-CON) 20 MEQ tablet Take 1 tablet (20 mEq total) by mouth daily. 60 tablet 0   REPATHA SURECLICK 440 MG/ML SOAJ ADMINISTER 140 MG(1 ML) UNDER THE SKIN EVERY 14 DAYS 2 mL 11   Tafamidis (VYNDAMAX) 61 MG CAPS TAKE 1 CAPSULE BY MOUTH ONCE A DAY 30 capsule 11   Vitamin A 3 MG (10000 UT) TABS 1 tablet     vutrisiran sodium (AMVUTTRA) 25 MG/0.5ML syringe Inject 0.5 mLs (25 mg total) into the skin every 3 (three) months. 0.5 mL 3   No current facility-administered medications for this encounter.   BP 130/80   Pulse 91   Wt 74 kg (163 lb 3.2 oz)   LMP 02/12/2013   SpO2 99%   BMI 28.01 kg/m   Wt Readings from Last 3 Encounters:  04/07/22 74 kg (163 lb 3.2 oz)  12/31/21 73.8 kg (162 lb 9.6 oz)  09/02/21 68 kg (150 lb)    General: NAD Neck: No JVD, no thyromegaly or  thyroid nodule.  Lungs: Clear to auscultation bilaterally with normal respiratory effort. CV: Nondisplaced PMI.  Heart regular S1/S2, no S3/S4, no murmur.  No peripheral edema.   Abdomen: Soft, nontender, no hepatosplenomegaly, no distention.  Skin: Intact without lesions or rashes.  Neurologic: Alert and oriented x 3.  Psych: Normal affect. Extremities: No clubbing or cyanosis.  HEENT: Normal.   Assessment/Plan: 1. Chronic diastolic CHF: Normal EF on 10/18 echo with severe LVH and speckled myocardium consistent with cardiac amyloidosis.  SPEP was negative. 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 Cardiac MRI also strongly suggestive of cardiac amyloidosis. Echo 2/23 showed EF 55-60%. NYHA class II, limited mostly by OA.  She is not volume overloaded by exam or by Cardiomems. - Continue Lasix 40 qam/20 qpm, check BMET/BNP.     - Continue carvedilol 3.125 mg bid. - Continue Jardiance 10 mg daily. No GU symptoms. 2. Mitral regurgitation: Mitral regurgitation seems to fluctuate somewhat based on loading conditions.  Most recently, echo in 2/19 suggested severe MR and cardiac MRI suggested moderate to severe MR (moderate by 41% regurgitant fraction but visually severe). Suspect that the etiology is mixed - functional as well as degenerative with thickening and restriction of posterior leaflet. She is now s/p Mitraclip procedure in 5/19. Echo in 11/21 showed mean gradient 5 mmHg across the MV with Mitraclip in place, MR is mild. Echo 2/23 showed mild to moderate MR mean gradient 3 mmHg, stable. 3. CKD: Stage III.  BMET today.  4. CVA: 7/18. Has LINQ monitor, no atrial fibrillation detected so far.  - Continue ASA 81.   - Continue Plavix. 5. 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  Neuropathy is FAP 1 and PND 1.  Neuropathic pain has improved with Amvuttra.  - Continue Amvuttra. Will receive injection today by pharmacist.  6. Cardiac amyloidosis: Hereditary TTR amyloidosis.  She has peripheral neuropathy as well as bilateral carpal tunnel syndrome.   - She is now on tafamidis and Amvuttra, continue.   7. Hyperlipidemia: Goal LDL < 70 with CVA.  Cannot take statins due to rhabdomyolysis with Crestor (interaction with tafamidis).  LDL 150 in 11/22 but was not compliant at the time with Repatha, now says she is taking.  LDL still above goal at 103 in 6/23.  - Continue Repatha and Zetia.  - Followed in lipid clinic.  8. Osteoarthritis: Patient hurts in multiple joints, hips and knees are the worst.  She says that THR was recommended in the past. Saw Dr. Marlou Sa with Ortho, and decided on conservative treatment with bilateral hip injections. - Would return to ortho to see if further steroid injections would be helpful.  9. Goiter: Goiter noted on neck exam.  She has hypothyroidism and is on Levoxyl.   Follow up in 3 months with APP.   Loralie Champagne, 04/07/2022  10:21 AM

## 2022-04-07 NOTE — Telephone Encounter (Signed)
Pt would like to repeat bil hip injections with [place].

## 2022-04-07 NOTE — Patient Instructions (Signed)
Start Protonix 40 mg daily.  Labs done today, your results will be available in MyChart, we will contact you for abnormal readings.  Please call your Orthopedic Doctor to arrange an appointment   Your physician recommends that you schedule a follow-up appointment in: 3 months  If you have any questions or concerns before your next appointment please send [place] a message through [place] or call our office at [phone].    TO LEAVE A MESSAGE FOR THE NURSE SELECT OPTION 2, PLEASE LEAVE A MESSAGE INCLUDING: YOUR NAME DATE OF BIRTH CALL BACK NUMBER REASON FOR CALL**this is important as we prioritize the call backs  YOU WILL RECEIVE A CALL BACK THE SAME DAY AS LONG AS YOU CALL BEFORE 4:00 PM  At the [facility], you and your health needs are our priority. As part of our continuing mission to provide you with exceptional heart care, we have created designated Provider Care Teams. These Care Teams include your primary Cardiologist (physician) and Advanced Practice Providers (APPs- Physician Assistants and Nurse Practitioners) who all work together to provide you with the care you need, when you need it.   You may see any of the following providers on your designated Care Team at your next follow up: Dr Glori Bickers Dr Loralie Champagne Dr. Roxana Hires, NP Lyda Jester, [place] [facility] [place], [place] Forestine Na, NP Audry Riles, PharmD   Please be sure to bring in all your medications bottles to every appointment.

## 2022-04-08 ENCOUNTER — Other Ambulatory Visit (HOSPITAL_COMMUNITY): Payer: Self-pay

## 2022-04-08 NOTE — Telephone Encounter (Signed)
Scheduled with Rolena Infante

## 2022-04-08 NOTE — Patient Outreach (Signed)
  Care Coordination   Initial Visit Note    Name: Katie [name] MRN: [id number] DOB: 03-23-1946  Katie [name] is a 76 y.o. year old female who sees Seward Carol, MD for primary care. I spoke with  [name] by phone today.  What matters to the patients health and wellness today?  No Concerns Addressed   Goals Addressed             This Visit's Progress    Care Coordination Activities       Care Coordination Interventions: Reviewed plan for disease management. Reports adhering to plan and attending medical appointments as scheduled. Reviewed medications. Reports managing well. Denies concerns r/t medication management or prescription cost. Assessed social determinant of health barriers. Reports AWV is scheduled for October.          SDOH assessments and interventions completed:  Yes  SDOH Interventions Today    Flowsheet Row Most Recent Value  SDOH Interventions   Food Insecurity Interventions Intervention Not Indicated  Transportation Interventions Intervention Not Indicated  [Reports currently utilizing transportation provided by health plan.]        Care Coordination Interventions Activated:  Yes  Care Coordination Interventions:  Yes, provided   Follow up plan: No further intervention required.   Encounter Outcome:  Pt. Visit Completed   [facility] [phone]

## 2022-04-08 NOTE — Patient Instructions (Signed)
Visit Information  Thank you for taking time to speak with me. Please don't hesitate to contact me if you require additional assistance.  Following are the goals we discussed today:   Goals Addressed             This Visit's Progress    Care Coordination Activities       Care Coordination Interventions: Reviewed plan for disease management. Reports adhering to plan and attending medical appointments as scheduled. Reviewed medications. Reports managing well. Denies concerns r/t medication management or prescription cost. Assessed social determinant of health barriers. Reports AWV is scheduled for October.        Katie [name] verbalized understanding of information discussed during the telephonic outreach.  Declined need for mailed instructions or resources.   No further follow up required: Katie [name] agreed to call for assistance if needed.   [facility] [phone]

## 2022-04-12 ENCOUNTER — Encounter (HOSPITAL_COMMUNITY): Payer: Medicare Other | Admitting: Cardiology

## 2022-04-15 ENCOUNTER — Ambulatory Visit: Payer: Self-pay

## 2022-04-15 ENCOUNTER — Encounter: Payer: Self-pay | Admitting: Sports Medicine

## 2022-04-15 ENCOUNTER — Ambulatory Visit (INDEPENDENT_AMBULATORY_CARE_PROVIDER_SITE_OTHER): Payer: Medicare Other | Admitting: Sports Medicine

## 2022-04-15 VITALS — BP 120/74 | HR 82 | Ht 64.0 in | Wt 163.2 lb

## 2022-04-15 DIAGNOSIS — M160 Bilateral primary osteoarthritis of hip: Secondary | ICD-10-CM | POA: Diagnosis not present

## 2022-04-15 DIAGNOSIS — M25552 Pain in left hip: Secondary | ICD-10-CM | POA: Diagnosis not present

## 2022-04-15 DIAGNOSIS — M25551 Pain in right hip: Secondary | ICD-10-CM | POA: Diagnosis not present

## 2022-04-15 NOTE — Progress Notes (Signed)
   Procedure Note  Patient: Katie [name]             Date of Birth: [birth year]           MRN: [id number]             Visit Date: 04/15/2022  Procedures: Visit Diagnoses:  1. Bilateral hip pain   2. Bilateral primary osteoarthritis of hip    Procedure: US-guided intra-articular hip injection, left After discussion on risks/benefits/indications and informed verbal consent was obtained, a timeout was performed. Patient was lying supine on exam table. The hip was cleaned with betadine and alcohol swabs. Then utilizing ultrasound guidance, the patient's femoral head and neck junction was identified and subsequently injected with 4:1 lidocaine:depomedrol via an in-plane approach with ultrasound visualization of the injectate administered into the hip joint. Patient tolerated procedure well without immediate complications.  Procedure: US-guided intra-articular hip injection, hip After discussion on risks/benefits/indications and informed verbal consent was obtained, a timeout was performed. Patient was lying supine on exam table. The hip was cleaned with betadine and alcohol swabs. Then utilizing ultrasound guidance, the patient's femoral head and neck junction was identified and subsequently injected with 4:2 lidocaine:depomedrol via an in-plane approach with ultrasound visualization of the injectate administered into the hip joint. Patient tolerated procedure well without immediate complications.  - I evaluated the patient about 10 minutes post-injection and they had improvement in pain following injection. May use ice and/or tylenol for any post-injection pain - follow-up with Katie [name] as indicated; I am happy to see them as needed  Elba Barman, DO [place]  This note was dictated using Dragon naturally speaking software and may contain errors in syntax, spelling, or content which have not been identified prior to signing this  note.

## 2022-04-19 ENCOUNTER — Other Ambulatory Visit (HOSPITAL_COMMUNITY): Payer: Self-pay

## 2022-04-22 ENCOUNTER — Other Ambulatory Visit (HOSPITAL_COMMUNITY): Payer: Self-pay

## 2022-04-23 DIAGNOSIS — I5032 Chronic diastolic (congestive) heart failure: Secondary | ICD-10-CM | POA: Diagnosis not present

## 2022-04-23 DIAGNOSIS — E039 Hypothyroidism, unspecified: Secondary | ICD-10-CM | POA: Diagnosis not present

## 2022-04-23 DIAGNOSIS — E782 Mixed hyperlipidemia: Secondary | ICD-10-CM | POA: Diagnosis not present

## 2022-04-23 DIAGNOSIS — N1832 Chronic kidney disease, stage 3b: Secondary | ICD-10-CM | POA: Diagnosis not present

## 2022-04-23 DIAGNOSIS — I10 Essential (primary) hypertension: Secondary | ICD-10-CM | POA: Diagnosis not present

## 2022-04-26 ENCOUNTER — Encounter (HOSPITAL_COMMUNITY): Payer: Medicare Other | Admitting: Cardiology

## 2022-04-28 ENCOUNTER — Other Ambulatory Visit (HOSPITAL_COMMUNITY): Payer: Self-pay

## 2022-04-30 ENCOUNTER — Other Ambulatory Visit (HOSPITAL_COMMUNITY): Payer: Self-pay

## 2022-05-06 NOTE — Progress Notes (Signed)
Follow-up Visit   Date: 05/07/22    Katie [name] MRN: [id number] DOB: 03-27-1946   Interim History: Katie [name] is a 76 y.o. 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  The patient was accompanied to the clinic by self.  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  She has bilateral CTS s/p release and nonlength-dependent neuropathy.  She has been on Amvuttra since November 2022 and reports improved tingling, but numbness unchanged.  Clinically, with mild improvement in painful paresthesias.   - Continue Amvuttra   - Continue gabapentin '300mg'$  BID  - Patient educated on daily foot inspection, fall prevention, and safety precautions around the home.   2.  History of left cerebellar embolic stroke (0998) secondary to basilar occlusion, no residual symptoms.  - Continue aspiring '81mg'$  + plavix '75mg'$  daily  - Continue zetia '10mg'$   Return to clinic in 1 year  ------------------------------------------------------------------------------------- History of present illness: She has history of heart failure since 2017 and underwent PYP amyloid scan in December suggestive of transthyretin amyloidosis.  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  She is being evaluated for patisiran treatment.   From a neurological standpoint, patient has had history of bilateral carpal tunnel release and continues to have hand tingling.  She has some weakness related to arthritis, but denies dropping objects. Over the past year, she has intermittent tingling of both feet.  She denies imbalance and has not suffered any falls.  She walks  unassisted.  She manages her own household duties such as cooking, cleaning, and driving. Her son who is an amputee lives with her.  There is no family history of neuropathy.   Of note, she had a left cerebellar stroke in July 2018 secondary to embolic basilar occlusion and absent flow in bilateral SCA and PCAs.  She was treated with high-dose statin and dual antiplatelet therapy. Subsequent MRA from March 2019 shows no residual stenosis.  UPDATE 08/07/2021: She is here for follow-up visit. She has been transitioned to [place] since November and is tolerating this well. There has been no progression of neuropathy. She continues to have numbness/tingling in the hands and feet which is stable. No new weakness. She had one fall in her kitchen after loosing balance. Fortunately, no injuries. Her primary complaint is ongoing bilateral hip pain from arthritis. She was offered hip replacement surgery, but due to her medical comorbidities has decided against this.  She complains of generalized pain in her knees and legs.  UPDATE 05/06/2022:  She is here for follow-up visit.  She has been on Amvuttra almost 1 year in November 2022 and reports that her neuropathic pain has improved.  She no longer has tingling in the hands and feet.  Numbness is unchanged.  Balance is poor, but she is very compliant with using a walker which helps.  No interval falls. She continues to have bilateral hip pain and had steroid injection two weeks ago.  No new neurological complaints.    Medications:  Current Outpatient Medications on File Prior to Visit  Medication Sig Dispense Refill   acetaminophen (TYLENOL) 500 MG tablet Take 1,000 mg by mouth every 6 (six) hours as needed for headache (pain).     aspirin 81 MG tablet Take  1 tablet (81 mg total) daily by mouth.     butalbital-acetaminophen-caffeine (FIORICET, ESGIC) 50-325-40 MG tablet Take 1 tablet by mouth every 12 (twelve) hours as needed for headache. 14 tablet 0   carvedilol  (COREG) 3.125 MG tablet TAKE 1 TABLET(3.125 MG) BY MOUTH TWICE DAILY WITH A MEAL 120 tablet 3   clopidogrel (PLAVIX) 75 MG tablet Take 1 tablet (75 mg total) by mouth daily. 30 tablet 1   empagliflozin (JARDIANCE) 10 MG TABS tablet Take 1 tablet (10 mg total) by mouth daily before breakfast. 90 tablet 3   EPINEPHrine 0.3 mg/0.3 mL IJ SOAJ injection Inject 1 mg into the muscle as needed.     ezetimibe (ZETIA) 10 MG tablet Take 1 tablet (10 mg total) by mouth daily. 90 tablet 3   feeding supplement, ENSURE ENLIVE, (ENSURE ENLIVE) LIQD Take 237 mLs by mouth 2 (two) times daily between meals. 237 mL 0   furosemide (LASIX) 20 MG tablet TAKE 2 TABLETS BY MOUTH IN THE MORNING THEN TAKE 1 TABLET BY MOUTH EVERY EVENING 90 tablet 3   gabapentin (NEURONTIN) 300 MG capsule Take 300 mg by mouth 2 (two) times daily.     Hypromellose (ARTIFICIAL TEARS OP) Place 1 drop into both eyes daily as needed (dry eyes).     levothyroxine (SYNTHROID, LEVOTHROID) 25 MCG tablet Take 25 mcg by mouth daily before breakfast.     pantoprazole (PROTONIX) 40 MG tablet Take 1 tablet (40 mg total) by mouth daily. 90 tablet 3   potassium chloride SA (KLOR-CON) 20 MEQ tablet Take 1 tablet (20 mEq total) by mouth daily. 60 tablet 0   REPATHA SURECLICK 371 MG/ML SOAJ ADMINISTER 140 MG(1 ML) UNDER THE SKIN EVERY 14 DAYS 2 mL 11   Tafamidis (VYNDAMAX) 61 MG CAPS TAKE 1 CAPSULE BY MOUTH ONCE A DAY 30 capsule 11   Vitamin A 3 MG (10000 UT) TABS 1 tablet     vutrisiran sodium (AMVUTTRA) 25 MG/0.5ML syringe Inject 0.5 mLs (25 mg total) into the skin every 3 (three) months. 0.5 mL 3   No current facility-administered medications on file prior to visit.    Allergies:  Allergies  Allergen Reactions   Crestor [Rosuvastatin] Other (See Comments)    Drug interaction w/ tafamidis, caused rhabdomyolysis   Tramadol Shortness Of Breath and Palpitations    Vital Signs:  BP 134/69   Pulse 86   Ht '5\' 4"'$  (1.626 m)   Wt 160 lb (72.6 kg)   LMP  02/12/2013   SpO2 95%   BMI 27.46 kg/m    Neurological Exam: MENTAL STATUS including orientation to time, place, person, recent and remote memory, attention span and concentration, language, and fund of knowledge is normal.  Speech is not dysarthric.  CRANIAL NERVES: Pupils equal round and reactive to light.  Normal conjugate, extra-ocular eye movements in all directions of gaze.  No ptosis.   MOTOR:  Motor strength is 5/5 in all extremities, except 5-/5 bilateral interosseus muscles. Hip flexion motor strength is 5/5. No atrophy, fasciculations or abnormal movements.  No pronator drift.  Tone is normal.    MSRs:  Reflexes are 2+/4 throughout, except 1+/4 bilateral Achilles reflex.  SENSORY: Vibration is reduced at the left ankle.  Sensation is intact at the MCP bilaterally.  COORDINATION/GAIT:    Gait is slow, wide based, assisted with a walker, stable.   Data: MRA head 10/14/2017:  Negative intracranial MRA. No residual or recurrent distal basilar Stenosis.   Cardiac SPECT 07/06/2017:  Visual and quantitative assessment (grade 3, H/CLL equal 1.94) are strongly suggestive of transthyretin amyloidosis.   MRI/A head 04/22/2017:    Chronic ischemic changes as above.  No acute infarct.  No significant intracranial stenosis on MRA. Previously noted embolic occlusion distal basilar extending into the posterior cerebral artery bilaterally has resolved.  NCS/EMG of the right side 12/15/2017: The electrophysiologic findings are most consistent with a sensorimotor polyneuropathy, demyelinating with secondary axon loss, affecting the right upper extremity; moderate in degree electrically.     Alternatively, right carpal tunnel and cubital tunnel syndrome cannot be excluded. Correlate clinically.     Thank you for allowing me to participate in patient's care.  If I can answer any additional questions, I would be pleased to do so.    Sincerely,    Lafe Clerk K. Posey Pronto, DO

## 2022-05-07 ENCOUNTER — Ambulatory Visit (INDEPENDENT_AMBULATORY_CARE_PROVIDER_SITE_OTHER): Payer: Medicare Other | Admitting: Neurology

## 2022-05-07 ENCOUNTER — Encounter: Payer: Self-pay | Admitting: Neurology

## 2022-05-07 VITALS — BP 134/69 | HR 86 | Ht 64.0 in | Wt 160.0 lb

## 2022-05-07 DIAGNOSIS — E851 Neuropathic heredofamilial amyloidosis: Secondary | ICD-10-CM

## 2022-05-07 DIAGNOSIS — D472 Monoclonal gammopathy: Secondary | ICD-10-CM

## 2022-05-07 DIAGNOSIS — E854 Organ-limited amyloidosis: Secondary | ICD-10-CM | POA: Diagnosis not present

## 2022-05-07 DIAGNOSIS — I639 Cerebral infarction, unspecified: Secondary | ICD-10-CM

## 2022-05-07 DIAGNOSIS — G63 Polyneuropathy in diseases classified elsewhere: Secondary | ICD-10-CM | POA: Diagnosis not present

## 2022-05-07 DIAGNOSIS — G990 Autonomic neuropathy in diseases classified elsewhere: Secondary | ICD-10-CM | POA: Diagnosis not present

## 2022-05-07 NOTE — Patient Instructions (Signed)
Return to clinic in 1 year.

## 2022-05-08 ENCOUNTER — Other Ambulatory Visit (HOSPITAL_COMMUNITY): Payer: Self-pay | Admitting: Cardiology

## 2022-05-12 ENCOUNTER — Other Ambulatory Visit (HOSPITAL_COMMUNITY): Payer: Self-pay | Admitting: Cardiology

## 2022-05-14 DIAGNOSIS — N390 Urinary tract infection, site not specified: Secondary | ICD-10-CM | POA: Diagnosis not present

## 2022-05-14 DIAGNOSIS — G629 Polyneuropathy, unspecified: Secondary | ICD-10-CM | POA: Diagnosis not present

## 2022-05-14 DIAGNOSIS — E785 Hyperlipidemia, unspecified: Secondary | ICD-10-CM | POA: Diagnosis not present

## 2022-05-14 DIAGNOSIS — I5032 Chronic diastolic (congestive) heart failure: Secondary | ICD-10-CM | POA: Diagnosis not present

## 2022-05-14 DIAGNOSIS — I43 Cardiomyopathy in diseases classified elsewhere: Secondary | ICD-10-CM | POA: Diagnosis not present

## 2022-05-14 DIAGNOSIS — C641 Malignant neoplasm of right kidney, except renal pelvis: Secondary | ICD-10-CM | POA: Diagnosis not present

## 2022-05-14 DIAGNOSIS — I129 Hypertensive chronic kidney disease with stage 1 through stage 4 chronic kidney disease, or unspecified chronic kidney disease: Secondary | ICD-10-CM | POA: Diagnosis not present

## 2022-05-14 DIAGNOSIS — E854 Organ-limited amyloidosis: Secondary | ICD-10-CM | POA: Diagnosis not present

## 2022-05-14 DIAGNOSIS — N184 Chronic kidney disease, stage 4 (severe): Secondary | ICD-10-CM | POA: Diagnosis not present

## 2022-05-17 DIAGNOSIS — I43 Cardiomyopathy in diseases classified elsewhere: Secondary | ICD-10-CM | POA: Diagnosis not present

## 2022-05-17 DIAGNOSIS — Z23 Encounter for immunization: Secondary | ICD-10-CM | POA: Diagnosis not present

## 2022-05-17 DIAGNOSIS — Z0000 Encounter for general adult medical examination without abnormal findings: Secondary | ICD-10-CM | POA: Diagnosis not present

## 2022-05-17 DIAGNOSIS — E7800 Pure hypercholesterolemia, unspecified: Secondary | ICD-10-CM | POA: Diagnosis not present

## 2022-05-17 DIAGNOSIS — E8582 Wild-type transthyretin-related (ATTR) amyloidosis: Secondary | ICD-10-CM | POA: Diagnosis not present

## 2022-05-17 DIAGNOSIS — E852 Heredofamilial amyloidosis, unspecified: Secondary | ICD-10-CM | POA: Diagnosis not present

## 2022-05-17 DIAGNOSIS — M87059 Idiopathic aseptic necrosis of unspecified femur: Secondary | ICD-10-CM | POA: Diagnosis not present

## 2022-05-17 DIAGNOSIS — I10 Essential (primary) hypertension: Secondary | ICD-10-CM | POA: Diagnosis not present

## 2022-05-17 DIAGNOSIS — Z905 Acquired absence of kidney: Secondary | ICD-10-CM | POA: Diagnosis not present

## 2022-05-17 DIAGNOSIS — I5042 Chronic combined systolic (congestive) and diastolic (congestive) heart failure: Secondary | ICD-10-CM | POA: Diagnosis not present

## 2022-05-17 DIAGNOSIS — E039 Hypothyroidism, unspecified: Secondary | ICD-10-CM | POA: Diagnosis not present

## 2022-05-17 DIAGNOSIS — N1832 Chronic kidney disease, stage 3b: Secondary | ICD-10-CM | POA: Diagnosis not present

## 2022-05-25 ENCOUNTER — Other Ambulatory Visit (HOSPITAL_COMMUNITY): Payer: Self-pay

## 2022-05-25 ENCOUNTER — Other Ambulatory Visit (HOSPITAL_COMMUNITY): Payer: Self-pay | Admitting: Cardiology

## 2022-05-25 MED ORDER — VYNDAMAX 61 MG PO CAPS
1.0000 | ORAL_CAPSULE | Freq: Every day | ORAL | 11 refills | Status: DC
  Filled 2022-05-25: qty 30, 30d supply, fill #0
  Filled 2022-06-28 – 2022-07-07 (×3): qty 30, 30d supply, fill #1
  Filled 2022-07-27: qty 30, 30d supply, fill #2
  Filled 2022-08-27: qty 30, 30d supply, fill #3
  Filled 2022-09-30: qty 30, 30d supply, fill #4
  Filled 2022-10-28: qty 30, 30d supply, fill #5
  Filled 2022-12-02: qty 30, 30d supply, fill #6
  Filled 2023-01-04: qty 30, 30d supply, fill #7
  Filled 2023-02-01: qty 30, 30d supply, fill #8
  Filled 2023-02-28: qty 30, 30d supply, fill #9
  Filled 2023-03-30: qty 30, 30d supply, fill #10
  Filled 2023-05-20: qty 30, 30d supply, fill #11

## 2022-05-28 ENCOUNTER — Other Ambulatory Visit (HOSPITAL_COMMUNITY): Payer: Self-pay

## 2022-06-07 ENCOUNTER — Other Ambulatory Visit (HOSPITAL_COMMUNITY): Payer: Self-pay

## 2022-06-10 ENCOUNTER — Other Ambulatory Visit (HOSPITAL_COMMUNITY): Payer: Self-pay

## 2022-06-18 ENCOUNTER — Other Ambulatory Visit (HOSPITAL_COMMUNITY): Payer: Self-pay

## 2022-06-23 ENCOUNTER — Other Ambulatory Visit (HOSPITAL_COMMUNITY): Payer: Self-pay

## 2022-06-25 ENCOUNTER — Other Ambulatory Visit (HOSPITAL_COMMUNITY): Payer: Self-pay

## 2022-06-28 ENCOUNTER — Other Ambulatory Visit (HOSPITAL_COMMUNITY): Payer: Self-pay

## 2022-06-28 ENCOUNTER — Other Ambulatory Visit (HOSPITAL_COMMUNITY): Payer: Self-pay | Admitting: Pharmacist

## 2022-06-28 MED ORDER — AMVUTTRA 25 MG/0.5ML ~~LOC~~ SOSY
25.0000 mg | PREFILLED_SYRINGE | SUBCUTANEOUS | 3 refills | Status: DC
  Filled 2022-06-28 (×2): qty 0.5, 90d supply, fill #0
  Filled 2022-09-21: qty 0.5, 90d supply, fill #1
  Filled 2022-12-22: qty 0.5, 90d supply, fill #2
  Filled 2023-03-22: qty 0.5, 90d supply, fill #3

## 2022-06-29 ENCOUNTER — Other Ambulatory Visit (HOSPITAL_COMMUNITY): Payer: Self-pay

## 2022-07-02 ENCOUNTER — Other Ambulatory Visit (HOSPITAL_COMMUNITY): Payer: Self-pay

## 2022-07-05 ENCOUNTER — Other Ambulatory Visit (HOSPITAL_COMMUNITY): Payer: Self-pay

## 2022-07-05 NOTE — Progress Notes (Signed)
PCP: Dr. [name] HF Cardiology: Dr. [name]  76 y.o. with history of chronic diastolic CHF, mitral regurgitation, CVA, and CKD returns for followup of CHF.   Patient was initially admitted with diastolic CHF in 3/01. Echo showed EF 60-65% with grade 3 diastolic dysfunction and moderate MR.  She had cath showing no coronary disease. 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  TEE this admission showed moderate-severe MR.  She was admitted again in 10/18 with acute on chronic diastolic CHF. Echo this admission showed EF 50-55%, severe LVH, restrictive diastolic dysfunction, severe MR, moderate-severe TR.  She was referred to Dr. [name] who saw her for mitral valve evaluation.  Upon review of studies at that time, regurgitation thought to be more in the moderate range and likely functional.   Patient had PYP amyloid scan in 12/18, this was suggestive of transthyretin amyloidosis.  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   Admission for A/C diastolic HF 6/0-1/04/3234. Required IV diuresis, then transitioned to 80 mg PO lasix BID. Metop was DC'd due to low blood pressures. Cardiac MRI strongly suggestive of amyloidosis, moderate to severe MR (moderate by regurgitant fraction 41% but severe visually). Echo repeated: EF 40-45%, moderate to severe LVH, severe MR, severe dilation of LA and RA, moderate TR, PASP 49.  She had Mitraclip x 2 placed in 5/19.  Repeat echo post-mitraclip showed EF 55-60%, moderate LVH, mean gradient 3 mmHg across the MV with trace-mild MR.   Cardiomems was placed 6/19.    Echo 6/20 showed EF 55% with moderate LVH, s/p Mitraclip with mean gradient 3 mmHg, mild MR, normal RV, PASP 41 mmHg.   Echo in 11/21 showed EF 50-55%, moderate LVH, normal RV, PASP 44 mmHg, s/p Mitraclip with mean gradient 5 mmHg with mild MR.   Follow up 2/23, no significant neuropathic symptoms in feet on Amvuttra. Echo 2/23 showed EF 55-60%, mild LVH,  mild to moderate MR post Mitraclip.  Today she returns for HF follow up with her daughter.  Main complaint remains pain in hips and knees, arthritis.  Balance is poor, uses walker.  Now on Amvuttra, does not seem to have much neuropathic pain today.  Short of breath walking "long distances."  She gets some burning in her chest after eating, not with exertion.  No orthopnea/PND.   Cardiomems ready today 17, goal 19.  Labs (10/18): K 4.4, creatinine 1.27, LDL 56, HDL 39 Labs (11/18): K 3.9, creatinine 1.43, SPEP negative Labs (2/19): K 3.8, creatinine 1.40, hgb 13.4 Labs (5/19): K 4.1, creatinine 1.4, hgb 10.8 Labs (6/19): K 4.2, creatinine 1.4, hgb 12.4 Labs (7/19): K 4, creatinine 1.38 Labs (1/20): K 3.6, creatinine 1.36 Labs (3/20): K 3.6, creatinine 1.31, LDL 107 Labs (7/20): creatinine 1.48 Labs (10/20): LDL 87 Labs (12/21): LDL 127, BNP 485 Labs (1/22): K 4.6, creatinine 1.65 Labs (6/22): K 4.1, creatinine 1.67, LDL 142 Labs (11/22): LDL 150 (was not taking Repatha), K 4.2, creatinine 1.64 Labs (2/23): K 4.0, creatinine 1.68, LDL 110, HDL 55 Labs (6/23): K 4, creatinine 1.71, LDL 103  ECG (personally reviewed): NSR, poor RWP  PMH: 1. HTN 2. Hyperlipidemia 3. 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  LINQ monitor placed.  4. Chronic diastolic CHF: She has Cardiomems.  - LHC (6/17): Normal coronaries.  - Echo (10/18): EF 50-55%, severe LVH with speckled myocardium concerning for amyloidosis, restrictive diastolic function with septal flattening, severe MR, moderate-severe TR, severe biatrial enlargement, PASP 54  mmHg.  - PYP amyloid scan: Grade 3, suggestive of transthyretin amyloidosis.  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   - Echo (5/19): EF 55-60%, moderate LVH c/w amyloidosis, Mitraclip x 2 with mean MV gradient 3 mmHg, trace-mild MR, moderate TR, PASP 62 mmHg.  - Echo (6/19): EF 55-60%, moderate LVH with speckled myocardium, trivial MR s/p Mitraclip with mean gradient 2 mmHg, PASP 61.  - Echo (6/20): EF 55% with moderate LVH, s/p Mitraclip with mean gradient 3 mmHg, mild MR, normal RV, PASP 41 mmHg.  - Echo (11/21): EF 50-55%, moderate LVH, normal RV, PASP 44 mmHg, s/p Mitraclip with mean gradient 5 mmHg with mild MR. - Echo (2/23): EF 55-60%, mild LVH, mild to moderate MR post Mitraclip. 5. Mitral regurgitation: Think mixed functional/degenerative - TEE (7/18): EF 45-50%, moderate-severe MR, mild-moderate TR.  - Echo (10/18) read as severe MR.  - 2/19 echo with severe MR.  - 2/19 cardiac MRI with moderate to severe MR.  - Mitraclip x 2 in 5/19.   - Echo 5/19 post-Mitraclip with mean MV gradient 3 mmHg, trace-mild MR.  - Echo 6/20 post-Mitraclip with mean MV gradient 3 mmHg, mild MR.  - Echo 11/21 post-Mitraclip with mean MV gradient 5 mmHg, mild MR 6. CKD stage 3 7. Hypothyroidism 8. H/o renal cell CA s/p nephrectomy.  9. Bilateral carpal tunnel releases.  10. ABIs (6/20): Normal 11. OA  Social History   Socioeconomic History   Marital status: Divorced    Spouse name: Not on file   Number of children: 5   Years of education: 12   Highest education level: Not on file  Occupational History   Occupation: retired    Fish farm manager: [employer]  Tobacco Use   Smoking status: Never   Smokeless tobacco: Never  Vaping Use   Vaping Use: Never used  Substance and Sexual Activity   Alcohol use: No   Drug use: No   Sexual activity: Not Currently  Other Topics Concern   Not on file  Social History Narrative   Lives alone in a one story  home.  Her son stays with her some.  Has 5 children, 10 grandchildren and 10 great grandchildren.  Retired Training and development officer at [employer].  Education: high school.        07/14/18- list food insecurity but is aware of food pantries and gets $15 in food stamps, reports issues with medical bills but has insurance and gets extra help through medicaid for premiums- encouraged pt to call billing to set up reasonable payment plan so it doesn't go to collections.      Left Handed   Social Determinants of Health   Financial Resource Strain: Low Risk  (09/04/2019)   Overall Financial Resource Strain (CARDIA)    Difficulty of Paying Living Expenses: Not hard at all  Food Insecurity: No Food Insecurity (04/02/2022)   Hunger Vital Sign    Worried About Running Out of Food in the Last Year: Never true    Ran Out of Food in the Last Year: Never true  Transportation Needs: No Transportation Needs (04/02/2022)   PRAPARE - Transportation  Lack of Transportation (Medical): No    Lack of Transportation (Non-Medical): No  Physical Activity: Not on file  Stress: Not on file  Social Connections: Not on file  Intimate Partner Violence: Not At Risk (06/21/2019)   Humiliation, Afraid, Rape, and Kick questionnaire    Fear of Current or Ex-Partner: No    Emotionally Abused: No    Physically Abused: No    Sexually Abused: No   Family History  Problem Relation Age of Onset   Stroke Brother    Diabetes Mellitus II Brother    Diabetes Mellitus II Sister    Breast cancer Sister    Colon cancer Sister    ROS: All systems reviewed and negative except as per HPI.   Current Outpatient Medications  Medication Sig Dispense Refill   acetaminophen (TYLENOL) 500 MG tablet Take 1,000 mg by mouth every 6 (six) hours as needed for headache (pain).     aspirin 81 MG tablet Take 1 tablet (81 mg total) daily by mouth.     butalbital-acetaminophen-caffeine (FIORICET, ESGIC) 50-325-40 MG tablet Take 1 tablet by mouth every 12  (twelve) hours as needed for headache. 14 tablet 0   carvedilol (COREG) 3.125 MG tablet TAKE 1 TABLET(3.125 MG) BY MOUTH TWICE DAILY WITH A MEAL 120 tablet 3   clopidogrel (PLAVIX) 75 MG tablet Take 1 tablet (75 mg total) by mouth daily. 30 tablet 1   empagliflozin (JARDIANCE) 10 MG TABS tablet Take 1 tablet (10 mg total) by mouth daily before breakfast. 90 tablet 3   EPINEPHrine 0.3 mg/0.3 mL IJ SOAJ injection Inject 1 mg into the muscle as needed.     ezetimibe (ZETIA) 10 MG tablet Take 1 tablet (10 mg total) by mouth daily. 90 tablet 3   feeding supplement, ENSURE ENLIVE, (ENSURE ENLIVE) LIQD Take 237 mLs by mouth 2 (two) times daily between meals. 237 mL 0   furosemide (LASIX) 20 MG tablet TAKE 2 TABLETS BY MOUTH IN THE MORNING THEN TAKE 1 TABLET BY MOUTH EVERY EVENING 90 tablet 3   gabapentin (NEURONTIN) 300 MG capsule Take 300 mg by mouth 2 (two) times daily.     Hypromellose (ARTIFICIAL TEARS OP) Place 1 drop into both eyes daily as needed (dry eyes).     levothyroxine (SYNTHROID, LEVOTHROID) 25 MCG tablet Take 25 mcg by mouth daily before breakfast.     pantoprazole (PROTONIX) 40 MG tablet Take 1 tablet (40 mg total) by mouth daily. 90 tablet 3   potassium chloride SA (KLOR-CON) 20 MEQ tablet Take 1 tablet (20 mEq total) by mouth daily. 60 tablet 0   REPATHA SURECLICK 564 MG/ML SOAJ ADMINISTER 140 MG(1 ML) UNDER THE SKIN EVERY 14 DAYS 2 mL 11   Tafamidis (VYNDAMAX) 61 MG CAPS TAKE 1 CAPSULE BY MOUTH ONCE A DAY 30 capsule 11   Vitamin A 3 MG (10000 UT) TABS 1 tablet     vutrisiran sodium (AMVUTTRA) 25 MG/0.5ML syringe Inject 0.5 mLs (25 mg total) into the skin every 3 (three) months. 0.5 mL 3   No current facility-administered medications for this visit.   LMP 02/12/2013   Wt Readings from Last 3 Encounters:  05/07/22 72.6 kg (160 lb)  04/15/22 74 kg (163 lb 3.2 oz)  04/07/22 74 kg (163 lb 3.2 oz)   General: NAD Neck: No JVD, no thyromegaly or thyroid nodule.  Lungs: Clear to  auscultation bilaterally with normal respiratory effort. CV: Nondisplaced PMI.  Heart regular S1/S2, no S3/S4, no murmur.  No peripheral  edema.   Abdomen: Soft, nontender, no hepatosplenomegaly, no distention.  Skin: Intact without lesions or rashes.  Neurologic: Alert and oriented x 3.  Psych: Normal affect. Extremities: No clubbing or cyanosis.  HEENT: Normal.   Assessment/Plan: 1. Chronic diastolic CHF: Normal EF on 10/18 echo with severe LVH and speckled myocardium consistent with cardiac amyloidosis.  SPEP was negative. 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 Cardiac MRI also strongly suggestive of cardiac amyloidosis. Echo 2/23 showed EF 55-60%. NYHA class II, limited mostly by OA.  She is not volume overloaded by exam or by Cardiomems. - Continue Lasix 40 qam/20 qpm, check BMET/BNP.     - Continue carvedilol 3.125 mg bid. - Continue Jardiance 10 mg daily. No GU symptoms. 2. Mitral regurgitation: Mitral regurgitation seems to fluctuate somewhat based on loading conditions.  Most recently, echo in 2/19 suggested severe MR and cardiac MRI suggested moderate to severe MR (moderate by 41% regurgitant fraction but visually severe). Suspect that the etiology is mixed - functional as well as degenerative with thickening and restriction of posterior leaflet. She is now s/p Mitraclip procedure in 5/19. Echo in 11/21 showed mean gradient 5 mmHg across the MV with Mitraclip in place, MR is mild. Echo 2/23 showed mild to moderate MR mean gradient 3 mmHg, stable. 3. CKD: Stage III.  BMET today.  4. CVA: 7/18. Has LINQ monitor, no atrial fibrillation detected so far.  - Continue ASA 81.   - Continue Plavix. 5. 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  Neuropathy is FAP 1 and PND 1.  Neuropathic pain has improved with Amvuttra.  -  Continue Amvuttra. Will receive injection today by pharmacist.  6. Cardiac amyloidosis: Hereditary TTR amyloidosis.  She has peripheral neuropathy as well as bilateral carpal tunnel syndrome.   - She is now on tafamidis and Amvuttra, continue.   7. Hyperlipidemia: Goal LDL < 70 with CVA.  Cannot take statins due to rhabdomyolysis with Crestor (interaction with tafamidis).  LDL 150 in 11/22 but was not compliant at the time with Repatha, now says she is taking.  LDL still above goal at 103 in 6/23.  - Continue Repatha and Zetia.  - Followed in lipid clinic.  8. Osteoarthritis: Patient hurts in multiple joints, hips and knees are the worst.  She says that THR was recommended in the past. Saw Dr. Marlou Sa with Ortho, and decided on conservative treatment with bilateral hip injections. - Would return to ortho to see if further steroid injections would be helpful.  9. Goiter: Goiter noted on neck exam.  She has hypothyroidism and is on Levoxyl.   Follow up in 3 months with APP.   Perry, 07/05/2022  1:27 PM

## 2022-07-07 ENCOUNTER — Ambulatory Visit (HOSPITAL_BASED_OUTPATIENT_CLINIC_OR_DEPARTMENT_OTHER)
Admission: RE | Admit: 2022-07-07 | Discharge: 2022-07-07 | Disposition: A | Discharge status: Home / Self Care | Payer: Medicare Other | Source: Ambulatory Visit | Attending: Family Medicine | Admitting: Family Medicine

## 2022-07-07 ENCOUNTER — Ambulatory Visit (HOSPITAL_COMMUNITY)
Admission: RE | Admit: 2022-07-07 | Discharge: 2022-07-07 | Disposition: A | Discharge status: Home / Self Care | Payer: Medicare Other | Source: Ambulatory Visit | Attending: Internal Medicine | Admitting: Internal Medicine

## 2022-07-07 ENCOUNTER — Other Ambulatory Visit (HOSPITAL_COMMUNITY): Payer: Self-pay

## 2022-07-07 ENCOUNTER — Encounter (HOSPITAL_COMMUNITY): Payer: Self-pay

## 2022-07-07 ENCOUNTER — Other Ambulatory Visit: Payer: Self-pay

## 2022-07-07 VITALS — BP 150/100 | HR 87 | Wt 165.4 lb

## 2022-07-07 DIAGNOSIS — Z8673 Personal history of transient ischemic attack (TIA), and cerebral infarction without residual deficits: Secondary | ICD-10-CM

## 2022-07-07 DIAGNOSIS — N183 Chronic kidney disease, stage 3 unspecified: Secondary | ICD-10-CM | POA: Insufficient documentation

## 2022-07-07 DIAGNOSIS — E8589 Other amyloidosis: Secondary | ICD-10-CM | POA: Insufficient documentation

## 2022-07-07 DIAGNOSIS — M1610 Unilateral primary osteoarthritis, unspecified hip: Secondary | ICD-10-CM

## 2022-07-07 DIAGNOSIS — I130 Hypertensive heart and chronic kidney disease with heart failure and stage 1 through stage 4 chronic kidney disease, or unspecified chronic kidney disease: Secondary | ICD-10-CM | POA: Diagnosis not present

## 2022-07-07 DIAGNOSIS — I43 Cardiomyopathy in diseases classified elsewhere: Secondary | ICD-10-CM | POA: Diagnosis not present

## 2022-07-07 DIAGNOSIS — E049 Nontoxic goiter, unspecified: Secondary | ICD-10-CM

## 2022-07-07 DIAGNOSIS — Z7902 Long term (current) use of antithrombotics/antiplatelets: Secondary | ICD-10-CM | POA: Insufficient documentation

## 2022-07-07 DIAGNOSIS — M170 Bilateral primary osteoarthritis of knee: Secondary | ICD-10-CM | POA: Insufficient documentation

## 2022-07-07 DIAGNOSIS — E785 Hyperlipidemia, unspecified: Secondary | ICD-10-CM

## 2022-07-07 DIAGNOSIS — G629 Polyneuropathy, unspecified: Secondary | ICD-10-CM | POA: Insufficient documentation

## 2022-07-07 DIAGNOSIS — D472 Monoclonal gammopathy: Secondary | ICD-10-CM

## 2022-07-07 DIAGNOSIS — E854 Organ-limited amyloidosis: Secondary | ICD-10-CM

## 2022-07-07 DIAGNOSIS — M160 Bilateral primary osteoarthritis of hip: Secondary | ICD-10-CM | POA: Insufficient documentation

## 2022-07-07 DIAGNOSIS — I340 Nonrheumatic mitral (valve) insufficiency: Secondary | ICD-10-CM | POA: Insufficient documentation

## 2022-07-07 DIAGNOSIS — E851 Neuropathic heredofamilial amyloidosis: Secondary | ICD-10-CM

## 2022-07-07 DIAGNOSIS — E039 Hypothyroidism, unspecified: Secondary | ICD-10-CM | POA: Insufficient documentation

## 2022-07-07 DIAGNOSIS — Z7984 Long term (current) use of oral hypoglycemic drugs: Secondary | ICD-10-CM | POA: Insufficient documentation

## 2022-07-07 DIAGNOSIS — Z79890 Hormone replacement therapy (postmenopausal): Secondary | ICD-10-CM | POA: Diagnosis not present

## 2022-07-07 DIAGNOSIS — N1831 Chronic kidney disease, stage 3a: Secondary | ICD-10-CM | POA: Diagnosis not present

## 2022-07-07 DIAGNOSIS — G990 Autonomic neuropathy in diseases classified elsewhere: Secondary | ICD-10-CM | POA: Diagnosis not present

## 2022-07-07 DIAGNOSIS — I5032 Chronic diastolic (congestive) heart failure: Secondary | ICD-10-CM | POA: Diagnosis not present

## 2022-07-07 DIAGNOSIS — Z79899 Other long term (current) drug therapy: Secondary | ICD-10-CM | POA: Insufficient documentation

## 2022-07-07 DIAGNOSIS — G63 Polyneuropathy in diseases classified elsewhere: Secondary | ICD-10-CM | POA: Diagnosis not present

## 2022-07-07 LAB — BASIC METABOLIC PANEL
Anion gap: 9 (ref 5–15)
BUN: 31 mg/dL — ABNORMAL HIGH (ref 8–23)
CO2: 26 mmol/L (ref 22–32)
Calcium: 9.5 mg/dL (ref 8.9–10.3)
Chloride: 102 mmol/L (ref 98–111)
Creatinine, Ser: 1.71 mg/dL — ABNORMAL HIGH (ref 0.44–1.00)
GFR, Estimated: 30 mL/min — ABNORMAL LOW (ref >60)
Glucose, Bld: 83 mg/dL (ref 70–99)
Potassium: 3.8 mmol/L (ref 3.5–5.1)
Sodium: 137 mmol/L (ref 135–145)

## 2022-07-07 LAB — BRAIN NATRIURETIC PEPTIDE: B Natriuretic Peptide: 133.1 pg/mL — ABNORMAL HIGH (ref 0.0–100.0)

## 2022-07-07 MED ORDER — CARVEDILOL 6.25 MG PO TABS: 6.2500 mg | ORAL_TABLET | Freq: Two times a day (BID) | ORAL | 3 refills | Status: DC

## 2022-07-07 MED ORDER — VUTRISIRAN SODIUM 25 MG/0.5ML ~~LOC~~ SOSY
25.0000 mg | PREFILLED_SYRINGE | Freq: Once | SUBCUTANEOUS | Status: AC
  Administered 2022-07-07: 25 mg via SUBCUTANEOUS

## 2022-07-07 NOTE — Progress Notes (Signed)
PCP: Dr. [name] HF Cardiology: Dr. [name]  HPI:  76 y.o. with history of chronic diastolic CHF, mitral regurgitation, CVA, TTR amyloid, and CKD .    Patient was initially admitted with diastolic CHF in 10/4816. Echo showed EF 60-65% with grade 3 diastolic dysfunction and moderate MR.  She had cath showing no coronary disease. She was admitted in 01/2017 with left cerebellar CVA and LINQ monitor was placed.  TEE this admission showed moderate-severe MR.  She was admitted again in 05/2017 with acute on chronic diastolic CHF. Echo this admission showed EF 50-55%, severe LVH, restrictive diastolic dysfunction, severe MR, moderate-severe TR.  She was referred to Dr. [name] who saw her for mitral valve evaluation.  Upon review of studies at that time, regurgitation thought to be more in the moderate range and likely functional.    Patient had PYP amyloid scan in 07/2017, this was suggestive of transthyretin amyloidosis.  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    Admission for A/C diastolic HF 5/6-3/14/9702. Required IV diuresis, then transitioned to 80 mg PO Lasix BID. Metoprolol was DC'd due to low blood pressures. Cardiac MRI strongly suggestive of amyloidosis, moderate to severe MR (moderate by regurgitant fraction 41% but severe visually). Echo repeated: EF 40-45%, moderate to severe LVH, severe MR, severe dilation of LA and RA, moderate TR, PASP 49.   She had Mitraclip x 2 placed in 11/2017.  Repeat echo post-mitraclip showed EF 55-60%, moderate LVH, mean gradient 3 mmHg across the MV with trace-mild MR.    Cardiomems was placed 12/2017.     Echo 01/2019 showed EF 55% with moderate LVH, s/p Mitraclip with mean gradient 3 mmHg, mild MR, normal RV, PASP 41 mmHg.    Echo in 06/2020 showed EF 50-55%, moderate LVH, normal RV, PASP 44 mmHg, s/p Mitraclip with mean gradient 5 mmHg with mild MR.   Follow up 09/2021, no significant neuropathic symptoms in feet on Amvuttra. Echo 09/2021  showed EF 55-60%, mild LVH, mild to moderate MR post Mitraclip.     Today she returns to HF clinic for administration of Amvuttra (vutrisiran). Last Amvuttra injection was 04/07/22.  Overall feeling well today. No arm pain after her last injection. No contraindications to injection today.    Assessment/Plan: 1. Cardiac amyloidosis: Hereditary TTR amyloidosis.  She has peripheral neuropathy as well as bilateral carpal tunnel syndrome. Normal EF on 05/2017 echo with severe LVH and speckled myocardium consistent with cardiac amyloidosis.  SPEP was negative. 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 Cardiac MRI also strongly suggestive of cardiac amyloidosis.   - She is now on tafamidis, no side effects.  Receives from [employer]. - Amvuttra (vutrisiran) injection administered in clinic today. Patient tolerated injection well. Provided patient counseling on Amvuttra. Most common side effects are injection site reactions, arthralgias and dyspnea. Patient is aware to return to clinic every 3 months for repeat injection. Amvuttra will be obtained from [facility] and couriered to clinic for injection.  - Continue vitamin A supplement 8000 IU daily. Amvuttra decreases serum vitamin A levels. 2. 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  Neuropathy is FAP 1 and PND 1.  - Continue Amvuttra as above   Follow up 3 months with Pharmacy Clinic for repeat Injection (10/13/21)   Audry Riles, PharmD, BCPS, BCCP, CPP Heart Failure Clinic Pharmacist [phone]

## 2022-07-07 NOTE — Patient Instructions (Signed)
It was a pleasure seeing you today!  MEDICATIONS: -No medication changes today -Call if you have questions about your medications.   NEXT APPOINTMENT: Return to clinic in 3 months for repeat Amvuttra injection.  In general, to take care of your heart failure: -Limit your fluid intake to 2 Liters (half-gallon) per day.   -Limit your salt intake to ideally 2-3 grams (2000-3000 mg) per day. -Weigh yourself daily and record, and bring that "weight diary" to your next appointment.  (Weight gain of 2-3 pounds in 1 day typically means fluid weight.) -The medications for your heart are to help your heart and help you live longer.   -Please contact us before stopping any of your heart medications.  Call the clinic at [phone] with questions or to reschedule future appointments.

## 2022-07-07 NOTE — Patient Instructions (Signed)
INCREASE Carvedilol to 6.25 mg Twice daily  Labs done today, your results will be available in MyChart, we will contact you for abnormal readings.  Your physician recommends that you schedule a follow-up appointment in: 3 months  If you have any questions or concerns before your next appointment please send [place] a message through [place] or call our office at [phone].    TO LEAVE A MESSAGE FOR THE NURSE SELECT OPTION 2, PLEASE LEAVE A MESSAGE INCLUDING: YOUR NAME DATE OF BIRTH CALL BACK NUMBER REASON FOR CALL**this is important as we prioritize the call backs  YOU WILL RECEIVE A CALL BACK THE SAME DAY AS LONG AS YOU CALL BEFORE 4:00 PM  At the [facility], you and your health needs are our priority. As part of our continuing mission to provide you with exceptional heart care, we have created designated Provider Care Teams. These Care Teams include your primary Cardiologist (physician) and Advanced Practice Providers (APPs- Physician Assistants and Nurse Practitioners) who all work together to provide you with the care you need, when you need it.   You may see any of the following providers on your designated Care Team at your next follow up: Dr Glori Bickers Dr Loralie Champagne Dr. Roxana Hires, NP Lyda Jester, [place] [facility] [place], [place] Forestine Na, NP Audry Riles, PharmD   Please be sure to bring in all your medications bottles to every appointment.

## 2022-07-08 ENCOUNTER — Other Ambulatory Visit: Payer: Self-pay

## 2022-07-08 ENCOUNTER — Other Ambulatory Visit (HOSPITAL_COMMUNITY): Payer: Self-pay

## 2022-07-09 ENCOUNTER — Telehealth (HOSPITAL_COMMUNITY): Payer: Self-pay | Admitting: Pharmacy Technician

## 2022-07-09 NOTE — Telephone Encounter (Signed)
Patient Advocate Encounter   Received notification from [facility] that prior authorization for Vyndamax is required.   PA submitted on CoverMyMeds Key [id number] Status is pending   Will continue to follow.

## 2022-07-09 NOTE — Telephone Encounter (Signed)
Advanced Heart Failure Patient Advocate Encounter  Prior Authorization for Katie [name] has been approved.    PA# [id number] Effective dates: 07/09/22 through 08/02/23  [name], CPhT

## 2022-07-12 ENCOUNTER — Telehealth (HOSPITAL_COMMUNITY): Payer: Self-pay | Admitting: Pharmacist

## 2022-07-12 NOTE — Telephone Encounter (Signed)
Patient Advocate Encounter   Received notification that prior authorization for Amvuttra is required.   PA submitted on CoverMyMeds Key [id number]  Status is pending   Will continue to follow.   Audry Riles, PharmD, BCPS, BCCP, CPP Heart Failure Clinic Pharmacist [phone]

## 2022-07-13 DIAGNOSIS — E782 Mixed hyperlipidemia: Secondary | ICD-10-CM | POA: Diagnosis not present

## 2022-07-13 DIAGNOSIS — I5032 Chronic diastolic (congestive) heart failure: Secondary | ICD-10-CM | POA: Diagnosis not present

## 2022-07-13 DIAGNOSIS — I10 Essential (primary) hypertension: Secondary | ICD-10-CM | POA: Diagnosis not present

## 2022-07-13 DIAGNOSIS — E039 Hypothyroidism, unspecified: Secondary | ICD-10-CM | POA: Diagnosis not present

## 2022-07-13 DIAGNOSIS — N183 Chronic kidney disease, stage 3 unspecified: Secondary | ICD-10-CM | POA: Diagnosis not present

## 2022-07-13 NOTE — Telephone Encounter (Signed)
Advanced Heart Failure Patient Advocate Encounter  Prior Authorization for Amvuttra has been approved.    PA# [id number] Effective dates: 07/13/22 through 08/02/2023  Audry Riles, PharmD, BCPS, BCCP, CPP Heart Failure Clinic Pharmacist [phone]

## 2022-07-14 ENCOUNTER — Other Ambulatory Visit (HOSPITAL_COMMUNITY): Payer: Self-pay

## 2022-07-27 ENCOUNTER — Other Ambulatory Visit (HOSPITAL_COMMUNITY): Payer: Self-pay

## 2022-08-04 ENCOUNTER — Other Ambulatory Visit: Payer: Self-pay

## 2022-08-04 ENCOUNTER — Other Ambulatory Visit (HOSPITAL_COMMUNITY): Payer: Self-pay | Admitting: Cardiology

## 2022-08-05 ENCOUNTER — Other Ambulatory Visit (HOSPITAL_COMMUNITY): Payer: Self-pay

## 2022-08-16 DIAGNOSIS — H401131 Primary open-angle glaucoma, bilateral, mild stage: Secondary | ICD-10-CM | POA: Diagnosis not present

## 2022-08-19 ENCOUNTER — Other Ambulatory Visit (HOSPITAL_COMMUNITY): Payer: Self-pay

## 2022-08-20 ENCOUNTER — Other Ambulatory Visit (HOSPITAL_COMMUNITY): Payer: Self-pay

## 2022-08-25 ENCOUNTER — Other Ambulatory Visit (HOSPITAL_COMMUNITY): Payer: Self-pay

## 2022-08-27 ENCOUNTER — Other Ambulatory Visit (HOSPITAL_COMMUNITY): Payer: Self-pay

## 2022-09-08 ENCOUNTER — Other Ambulatory Visit: Payer: Self-pay

## 2022-09-10 ENCOUNTER — Other Ambulatory Visit (HOSPITAL_COMMUNITY): Payer: Self-pay | Admitting: Cardiology

## 2022-09-21 ENCOUNTER — Other Ambulatory Visit (HOSPITAL_COMMUNITY): Payer: Self-pay

## 2022-09-24 ENCOUNTER — Other Ambulatory Visit (HOSPITAL_COMMUNITY): Payer: Self-pay

## 2022-09-30 ENCOUNTER — Other Ambulatory Visit (HOSPITAL_COMMUNITY): Payer: Self-pay

## 2022-09-30 ENCOUNTER — Other Ambulatory Visit: Payer: Self-pay

## 2022-10-04 ENCOUNTER — Ambulatory Visit (HOSPITAL_COMMUNITY)
Admission: RE | Admit: 2022-10-04 | Discharge: 2022-10-04 | Disposition: A | Discharge status: Home / Self Care | Payer: 59 | Source: Ambulatory Visit | Attending: Cardiology | Admitting: Cardiology

## 2022-10-04 ENCOUNTER — Telehealth (HOSPITAL_COMMUNITY): Payer: Self-pay | Admitting: Pharmacist

## 2022-10-04 VITALS — BP 132/84 | HR 84 | Wt 169.0 lb

## 2022-10-04 DIAGNOSIS — M160 Bilateral primary osteoarthritis of hip: Secondary | ICD-10-CM | POA: Insufficient documentation

## 2022-10-04 DIAGNOSIS — E049 Nontoxic goiter, unspecified: Secondary | ICD-10-CM | POA: Insufficient documentation

## 2022-10-04 DIAGNOSIS — G5603 Carpal tunnel syndrome, bilateral upper limbs: Secondary | ICD-10-CM | POA: Insufficient documentation

## 2022-10-04 DIAGNOSIS — Z7902 Long term (current) use of antithrombotics/antiplatelets: Secondary | ICD-10-CM | POA: Diagnosis not present

## 2022-10-04 DIAGNOSIS — I5032 Chronic diastolic (congestive) heart failure: Secondary | ICD-10-CM | POA: Diagnosis not present

## 2022-10-04 DIAGNOSIS — Z8673 Personal history of transient ischemic attack (TIA), and cerebral infarction without residual deficits: Secondary | ICD-10-CM | POA: Diagnosis not present

## 2022-10-04 DIAGNOSIS — G629 Polyneuropathy, unspecified: Secondary | ICD-10-CM | POA: Insufficient documentation

## 2022-10-04 DIAGNOSIS — E785 Hyperlipidemia, unspecified: Secondary | ICD-10-CM | POA: Diagnosis not present

## 2022-10-04 DIAGNOSIS — Z85528 Personal history of other malignant neoplasm of kidney: Secondary | ICD-10-CM | POA: Diagnosis not present

## 2022-10-04 DIAGNOSIS — I43 Cardiomyopathy in diseases classified elsewhere: Secondary | ICD-10-CM | POA: Diagnosis not present

## 2022-10-04 DIAGNOSIS — Z7982 Long term (current) use of aspirin: Secondary | ICD-10-CM | POA: Diagnosis not present

## 2022-10-04 DIAGNOSIS — E851 Neuropathic heredofamilial amyloidosis: Secondary | ICD-10-CM | POA: Diagnosis not present

## 2022-10-04 DIAGNOSIS — E039 Hypothyroidism, unspecified: Secondary | ICD-10-CM | POA: Diagnosis not present

## 2022-10-04 DIAGNOSIS — M170 Bilateral primary osteoarthritis of knee: Secondary | ICD-10-CM | POA: Insufficient documentation

## 2022-10-04 DIAGNOSIS — Z905 Acquired absence of kidney: Secondary | ICD-10-CM | POA: Insufficient documentation

## 2022-10-04 DIAGNOSIS — N183 Chronic kidney disease, stage 3 unspecified: Secondary | ICD-10-CM | POA: Insufficient documentation

## 2022-10-04 DIAGNOSIS — E854 Organ-limited amyloidosis: Secondary | ICD-10-CM

## 2022-10-04 DIAGNOSIS — G63 Polyneuropathy in diseases classified elsewhere: Secondary | ICD-10-CM | POA: Diagnosis not present

## 2022-10-04 DIAGNOSIS — G990 Autonomic neuropathy in diseases classified elsewhere: Secondary | ICD-10-CM | POA: Diagnosis not present

## 2022-10-04 DIAGNOSIS — I130 Hypertensive heart and chronic kidney disease with heart failure and stage 1 through stage 4 chronic kidney disease, or unspecified chronic kidney disease: Secondary | ICD-10-CM | POA: Diagnosis not present

## 2022-10-04 DIAGNOSIS — M6282 Rhabdomyolysis: Secondary | ICD-10-CM | POA: Diagnosis not present

## 2022-10-04 DIAGNOSIS — I340 Nonrheumatic mitral (valve) insufficiency: Secondary | ICD-10-CM | POA: Insufficient documentation

## 2022-10-04 DIAGNOSIS — Z79899 Other long term (current) drug therapy: Secondary | ICD-10-CM | POA: Diagnosis not present

## 2022-10-04 DIAGNOSIS — Z79890 Hormone replacement therapy (postmenopausal): Secondary | ICD-10-CM | POA: Diagnosis not present

## 2022-10-04 DIAGNOSIS — D472 Monoclonal gammopathy: Secondary | ICD-10-CM | POA: Diagnosis not present

## 2022-10-04 LAB — BASIC METABOLIC PANEL
Anion gap: 8 (ref 5–15)
BUN: 27 mg/dL — ABNORMAL HIGH (ref 8–23)
CO2: 27 mmol/L (ref 22–32)
Calcium: 9.6 mg/dL (ref 8.9–10.3)
Chloride: 103 mmol/L (ref 98–111)
Creatinine, Ser: 1.86 mg/dL — ABNORMAL HIGH (ref 0.44–1.00)
GFR, Estimated: 28 mL/min — ABNORMAL LOW (ref >60)
Glucose, Bld: 84 mg/dL (ref 70–99)
Potassium: 4 mmol/L (ref 3.5–5.1)
Sodium: 138 mmol/L (ref 135–145)

## 2022-10-04 LAB — LIPID PANEL
Cholesterol: 198 mg/dL (ref 0–200)
HDL: 52 mg/dL (ref >40)
LDL Cholesterol: 126 mg/dL — ABNORMAL HIGH (ref 0–99)
Total CHOL/HDL Ratio: 3.8 RATIO
Triglycerides: 100 mg/dL (ref <150)
VLDL: 20 mg/dL (ref 0–40)

## 2022-10-04 LAB — BRAIN NATRIURETIC PEPTIDE: B Natriuretic Peptide: 151.2 pg/mL — ABNORMAL HIGH (ref 0.0–100.0)

## 2022-10-04 MED ORDER — VUTRISIRAN SODIUM 25 MG/0.5ML ~~LOC~~ SOSY
25.0000 mg | PREFILLED_SYRINGE | Freq: Once | SUBCUTANEOUS | Status: AC
  Administered 2022-10-04: 25 mg via SUBCUTANEOUS

## 2022-10-04 NOTE — Progress Notes (Signed)
PCP: Dr. [name] HF Cardiology: Dr. [name]  77 y.o. with history of chronic diastolic CHF, mitral regurgitation, CVA, and CKD returns for followup of CHF.   Patient was initially admitted with diastolic CHF in [id number]. Echo showed EF 60-65% with grade 3 diastolic dysfunction and moderate MR.  She had cath showing no coronary disease. 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  TEE this admission showed moderate-severe MR.  She was admitted again in 10/18 with acute on chronic diastolic CHF. Echo this admission showed EF 50-55%, severe LVH, restrictive diastolic dysfunction, severe MR, moderate-severe TR.  She was referred to Dr. [name] who saw her for mitral valve evaluation.  Upon review of studies at that time, regurgitation thought to be more in the moderate range and likely functional.   Patient had PYP amyloid scan in 12/18, this was suggestive of transthyretin amyloidosis.  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   Admission for A/C diastolic HF [id number]. Required IV diuresis, then transitioned to 80 mg PO lasix BID. Metop was DC'd due to low blood pressures. Cardiac MRI strongly suggestive of amyloidosis, moderate to severe MR (moderate by regurgitant fraction 41% but severe visually). Echo repeated: EF 40-45%, moderate to severe LVH, severe MR, severe dilation of LA and RA, moderate TR, PASP 49.  She had Mitraclip x 2 placed in 5/19.  Repeat echo post-mitraclip showed EF 55-60%, moderate LVH, mean gradient 3 mmHg across the MV with trace-mild MR.   Cardiomems was placed 6/19.    Echo 6/20 showed EF 55% with moderate LVH, s/p Mitraclip with mean gradient 3 mmHg, mild MR, normal RV, PASP 41 mmHg.   Echo in 11/21 showed EF 50-55%, moderate LVH, normal RV, PASP 44 mmHg, s/p Mitraclip with mean gradient 5 mmHg with mild MR.   Echo 2/23 showed EF 55-60%, mild LVH, mild to moderate MR post Mitraclip.  Patient returns for followup of  CHF/amyloidosis.  She is limited by joint pains, uses walker. She had hip steroid injections in the past that did not help. Has not wanted joint surgery.  No dyspnea walking on flat ground with walker.  Walks laps around house for exercise.  She is generally fatigued.  Having pain in her wrists bilaterally.    Cardiomems PADP 16 (target 19).   Labs (10/18): K 4.4, creatinine 1.27, LDL 56, HDL 39 Labs (11/18): K 3.9, creatinine 1.43, SPEP negative Labs (2/19): K 3.8, creatinine 1.40, hgb 13.4 Labs (5/19): K 4.1, creatinine 1.4, hgb 10.8 Labs (6/19): K 4.2, creatinine 1.4, hgb 12.4 Labs (7/19): K 4, creatinine 1.38 Labs (1/20): K 3.6, creatinine 1.36 Labs (3/20): K 3.6, creatinine 1.31, LDL 107 Labs (7/20): creatinine 1.48 Labs (10/20): LDL 87 Labs (12/21): LDL 127, BNP 485 Labs (1/22): K 4.6, creatinine 1.65 Labs (6/22): K 4.1, creatinine 1.67, LDL 142 Labs (11/22): LDL 150 (was not taking Repatha), K 4.2, creatinine 1.64 Labs (2/23): K 4.0, creatinine 1.68, LDL 110, HDL 55 Labs (6/23): K 4, creatinine 1.71, LDL 103 Labs (9/23): K 4.0, creatinine 1.65 Labs (12/23): BNP 133, K 3.8, creatinine 1.71  ECG (personally reviewed): NSR, normal  PMH: 1. HTN 2. Hyperlipidemia 3. 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  LINQ monitor placed.  4. Chronic diastolic CHF: She has Cardiomems.  - LHC (6/17): Normal coronaries.  - Echo (10/18): EF 50-55%, severe LVH with speckled myocardium concerning for amyloidosis, restrictive diastolic function with septal flattening, severe MR, moderate-severe TR, severe biatrial enlargement, PASP  54 mmHg.  - PYP amyloid scan: Grade 3, suggestive of transthyretin amyloidosis.  - Echo (09/2017): EF 40-45%, moderate to severe LVH, severe MR, severe dilation of LA and RA, moderate TR, PASP 49 - Cardiac MRI (02/19): Moderate LVH, EF 45% with diffuse hypokinesis, mildly dilated RV with mildly decreased systolic function, mod to severe MR (moderate by 41% regurgitant  fraction, severe visually), severe biatrial enlargement, LGE pattern strongly suggestive of cardiac amyloidosis - Genetic testing showed genetic TTR amyloidosis from Val142Ile.   - Echo (5/19): EF 55-60%, moderate LVH c/w amyloidosis, Mitraclip x 2 with mean MV gradient 3 mmHg, trace-mild MR, moderate TR, PASP 62 mmHg.  - Echo (6/19): EF 55-60%, moderate LVH with speckled myocardium, trivial MR s/p Mitraclip with mean gradient 2 mmHg, PASP 61.  - Echo (6/20): EF 55% with moderate LVH, s/p Mitraclip with mean gradient 3 mmHg, mild MR, normal RV, PASP 41 mmHg.  - Echo (11/21): EF 50-55%, moderate LVH, normal RV, PASP 44 mmHg, s/p Mitraclip with mean gradient 5 mmHg with mild MR. - Echo (2/23): EF 55-60%, mild LVH, mild to moderate MR post Mitraclip. 5. Mitral regurgitation: Think mixed functional/degenerative - TEE (7/18): EF 45-50%, moderate-severe MR, mild-moderate TR.  - Echo (10/18) read as severe MR.  - 2/19 echo with severe MR.  - 2/19 cardiac MRI with moderate to severe MR.  - Mitraclip x 2 in 5/19.   - Echo 5/19 post-Mitraclip with mean MV gradient 3 mmHg, trace-mild MR.  - Echo 6/20 post-Mitraclip with mean MV gradient 3 mmHg, mild MR.  - Echo 11/21 post-Mitraclip with mean MV gradient 5 mmHg, mild MR - Echo 2/23 EF 55-60%, mild LVH, mild to moderate MR post Mitraclip. 6. CKD stage 3 7. Hypothyroidism 8. H/o renal cell CA s/p nephrectomy.  9. Bilateral carpal tunnel releases.  10. ABIs (6/20): Normal 11. OA  Social History   Socioeconomic History   Marital status: Divorced    Spouse name: Not on file   Number of children: 5   Years of education: 12   Highest education level: Not on file  Occupational History   Occupation: retired    Fish farm manager: [employer]  Tobacco Use   Smoking status: Never   Smokeless tobacco: Never  Vaping Use   Vaping Use: Never used  Substance and Sexual Activity   Alcohol use: No   Drug use: No   Sexual activity: Not Currently  Other Topics  Concern   Not on file  Social History Narrative   Lives alone in a one story home.  Her son stays with her some.  Has 5 children, 10 grandchildren and 10 great grandchildren.  Retired Training and development officer at [employer].  Education: high school.        07/14/18- list food insecurity but is aware of food pantries and gets $15 in food stamps, reports issues with medical bills but has insurance and gets extra help through medicaid for premiums- encouraged pt to call billing to set up reasonable payment plan so it doesn't go to collections.      Left Handed   Social Determinants of Health   Financial Resource Strain: Low Risk  (09/04/2019)   Overall Financial Resource Strain (CARDIA)    Difficulty of Paying Living Expenses: Not hard at all  Food Insecurity: No Food Insecurity (04/02/2022)   Hunger Vital Sign    Worried About Running Out of Food in the Last Year: Never true    Ran Out of Food in the Last Year:  Never true  Transportation Needs: No Transportation Needs (04/02/2022)   PRAPARE - Hydrologist (Medical): No    Lack of Transportation (Non-Medical): No  Physical Activity: Not on file  Stress: Not on file  Social Connections: Not on file  Intimate Partner Violence: Not At Risk (06/21/2019)   Humiliation, Afraid, Rape, and Kick questionnaire    Fear of Current or Ex-Partner: No    Emotionally Abused: No    Physically Abused: No    Sexually Abused: No   Family History  Problem Relation Age of Onset   Stroke Brother    Diabetes Mellitus II Brother    Diabetes Mellitus II Sister    Breast cancer Sister    Colon cancer Sister    ROS: All systems reviewed and negative except as per HPI.   Current Outpatient Medications  Medication Sig Dispense Refill   acetaminophen (TYLENOL) 500 MG tablet Take 1,000 mg by mouth every 6 (six) hours as needed for headache (pain).     aspirin 81 MG tablet Take 1 tablet (81 mg total) daily by mouth.      butalbital-acetaminophen-caffeine (FIORICET, ESGIC) 50-325-40 MG tablet Take 1 tablet by mouth every 12 (twelve) hours as needed for headache. 14 tablet 0   carvedilol (COREG) 6.25 MG tablet Take 1 tablet (6.25 mg total) by mouth 2 (two) times daily with a meal. 180 tablet 3   clopidogrel (PLAVIX) 75 MG tablet Take 1 tablet (75 mg total) by mouth daily. 30 tablet 1   empagliflozin (JARDIANCE) 10 MG TABS tablet Take 1 tablet (10 mg total) by mouth daily before breakfast. 90 tablet 3   EPINEPHrine 0.3 mg/0.3 mL IJ SOAJ injection Inject 1 mg into the muscle as needed.     ezetimibe (ZETIA) 10 MG tablet Take 1 tablet (10 mg total) by mouth daily. 90 tablet 3   feeding supplement, ENSURE ENLIVE, (ENSURE ENLIVE) LIQD Take 237 mLs by mouth 2 (two) times daily between meals. 237 mL 0   furosemide (LASIX) 20 MG tablet TAKE 2 TABLETS BY MOUTH IN THE MORNING THEN TAKE 1 TABLET BY MOUTH EVERY EVENING 90 tablet 3   gabapentin (NEURONTIN) 300 MG capsule Take 300 mg by mouth 2 (two) times daily.     Hypromellose (ARTIFICIAL TEARS OP) Place 1 drop into both eyes daily as needed (dry eyes).     levothyroxine (SYNTHROID, LEVOTHROID) 25 MCG tablet Take 25 mcg by mouth daily before breakfast.     pantoprazole (PROTONIX) 40 MG tablet Take 1 tablet (40 mg total) by mouth daily. 90 tablet 3   potassium chloride SA (KLOR-CON) 20 MEQ tablet Take 1 tablet (20 mEq total) by mouth daily. 60 tablet 0   REPATHA SURECLICK [id number] MG/ML SOAJ ADMINISTER 140 MG(1 ML) UNDER THE SKIN EVERY 14 DAYS 2 mL 11   Tafamidis (VYNDAMAX) 61 MG CAPS TAKE 1 CAPSULE BY MOUTH ONCE A DAY 30 capsule 11   Vitamin A 3 MG (10000 UT) TABS 1 tablet     vutrisiran sodium (AMVUTTRA) 25 MG/0.5ML syringe Inject 0.5 mLs (25 mg total) into the skin every 3 (three) months. 0.5 mL 3   No current facility-administered medications for this encounter.   BP 132/84   Pulse 84   Wt 76.7 kg (169 lb)   LMP 02/12/2013   SpO2 99%   BMI 29.01 kg/m   Wt Readings from  Last 3 Encounters:  10/04/22 76.7 kg (169 lb)  07/07/22 75 kg (165 lb 6.4  oz)  05/07/22 72.6 kg (160 lb)  General: NAD Neck: No JVD, + thyromegaly.  Lungs: Clear to auscultation bilaterally with normal respiratory effort. CV: Nondisplaced PMI.  Heart regular S1/S2, no S3/S4, no murmur.  No peripheral edema.  No carotid bruit.  Normal pedal pulses.  Abdomen: Soft, nontender, no hepatosplenomegaly, no distention.  Skin: Intact without lesions or rashes.  Neurologic: Alert and oriented x 3.  Psych: Normal affect. Extremities: No clubbing or cyanosis.  HEENT: Normal.    Assessment/Plan: 1. Chronic diastolic CHF: Normal EF on 10/18 echo with severe LVH and speckled myocardium consistent with cardiac amyloidosis.  SPEP was negative. 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 Cardiac MRI also strongly suggestive of cardiac amyloidosis. Echo 2/23 showed EF 55-60%. NYHA class II, limited more by OA than dyspnea.  Volume status ok by exam and Cardiomems.  - Continue Coreg 6.25 mg bid. - Continue Lasix 40 qam/20 qpm. BMET/BNP today.  - Continue Jardiance 10 mg daily. No GU symptoms. - I will arrange for repeat echo.  2. Mitral regurgitation: Mitral regurgitation seems to fluctuate somewhat based on loading conditions.  Most recently, echo in 2/19 suggested severe MR and cardiac MRI suggested moderate to severe MR (moderate by 41% regurgitant fraction but visually severe). Suspect that the etiology is mixed - functional as well as degenerative with thickening and restriction of posterior leaflet. She is now s/p Mitraclip procedure in 5/19. Echo in 11/21 showed mean gradient 5 mmHg across the MV with Mitraclip in place, MR is mild. Echo 2/23 showed mild to moderate MR with mean gradient 3 mmHg, stable. - Echo as above.  3. CKD: Stage III.  BMET today.  4. CVA: 7/18. Has LINQ monitor, no atrial  fibrillation detected so far.  - Continue ASA 81.   - Continue Plavix. 5. 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  Neuropathy is FAP 1 and PND 1.  Neuropathic pain has improved with Amvuttra.  - Continue Amvuttra. Had injection today by pharmacist.  6. Cardiac amyloidosis: Hereditary TTR amyloidosis.  She has peripheral neuropathy as well as bilateral carpal tunnel syndrome.   - She is now on tafamidis and Amvuttra, continue.   7. Hyperlipidemia: Goal LDL < 70 with CVA.  Cannot take statins due to rhabdomyolysis with Crestor (interaction with tafamidis).  LDL 150 in 11/22 but was not compliant at the time with Repatha, now says she is taking.  LDL above goal at 103 in 6/23.  - Continue Repatha and Zetia.  - Check lipids today.  8. Osteoarthritis: Patient hurts in multiple joints, hips and knees are the worst.  She says that THR was recommended in the past. Saw Dr. Marlou Sa with Ortho, and decided on conservative treatment with bilateral hip injections.  Now with wrist pain.  - ?carpal tunnel syndrome with wrist pain and h/o amyloidosis.  Will get plain films.  9. Goiter: Goiter noted on neck exam.  She has hypothyroidism and is on Levoxyl.   Follow up in 4 months with APP.   Loralie Champagne, 10/04/2022  12:20 PM

## 2022-10-04 NOTE — Telephone Encounter (Signed)
Amvuttra (vutrisiran) injection administered in clinic visit today with Dr. [name]. Amvuttra given in Left arm. [place] IN:[id number]; Lot [id number]; exp date 08/2024   Patient tolerated injection well. Repeat injection scheduled for 01/05/23.  Audry Riles, PharmD, BCPS, BCCP, CPP Heart Failure Clinic Pharmacist [phone]

## 2022-10-04 NOTE — Patient Instructions (Signed)
There has been no changes to your mediactions.  Labs done today, your results will be available in MyChart, we will contact you for abnormal readings.  Your physician has requested that you have an echocardiogram. Echocardiography is a painless test that uses sound waves to create images of your heart. It provides your doctor with information about the size and shape of your heart and how well your heart's chambers and valves are working. This procedure takes approximately one hour. There are no restrictions for this procedure. Please do NOT wear cologne, perfume, aftershave, or lotions (deodorant is allowed). Please arrive 15 minutes prior to your appointment time.  Your provider has ordered X-rays of your wrists.  Your physician recommends that you schedule a follow-up appointment in: 4 months ( July) ** please call the office in May to arrange your follow up appointment. **  If you have any questions or concerns before your next appointment please send [place] a message through [place] or call our office at [phone].    TO LEAVE A MESSAGE FOR THE NURSE SELECT OPTION 2, PLEASE LEAVE A MESSAGE INCLUDING: YOUR NAME DATE OF BIRTH CALL BACK NUMBER REASON FOR CALL**this is important as we prioritize the call backs  YOU WILL RECEIVE A CALL BACK THE SAME DAY AS LONG AS YOU CALL BEFORE 4:00 PM  At the [facility], you and your health needs are our priority. As part of our continuing mission to provide you with exceptional heart care, we have created designated Provider Care Teams. These Care Teams include your primary Cardiologist (physician) and Advanced Practice Providers (APPs- Physician Assistants and Nurse Practitioners) who all work together to provide you with the care you need, when you need it.   You may see any of the following providers on your designated Care Team at your next follow up: Dr Glori Bickers Dr Loralie Champagne Dr. Roxana Hires,  NP Lyda Jester, [place] [facility] [place], [place] Forest[place] Na, NP Audry Riles, PharmD   Please be sure to bring in all your medications bottles to every appointment.    Thank you for choosing [facility]

## 2022-10-05 ENCOUNTER — Other Ambulatory Visit (HOSPITAL_COMMUNITY): Payer: Self-pay

## 2022-10-14 ENCOUNTER — Other Ambulatory Visit (HOSPITAL_COMMUNITY): Payer: Medicare Other

## 2022-10-20 DIAGNOSIS — I10 Essential (primary) hypertension: Secondary | ICD-10-CM | POA: Diagnosis not present

## 2022-10-20 DIAGNOSIS — E782 Mixed hyperlipidemia: Secondary | ICD-10-CM | POA: Diagnosis not present

## 2022-10-20 DIAGNOSIS — N183 Chronic kidney disease, stage 3 unspecified: Secondary | ICD-10-CM | POA: Diagnosis not present

## 2022-10-20 DIAGNOSIS — I5032 Chronic diastolic (congestive) heart failure: Secondary | ICD-10-CM | POA: Diagnosis not present

## 2022-10-20 DIAGNOSIS — E039 Hypothyroidism, unspecified: Secondary | ICD-10-CM | POA: Diagnosis not present

## 2022-10-26 ENCOUNTER — Ambulatory Visit (HOSPITAL_COMMUNITY)
Admission: RE | Admit: 2022-10-26 | Discharge: 2022-10-26 | Disposition: A | Discharge status: Home / Self Care | Payer: 59 | Source: Ambulatory Visit | Attending: Internal Medicine | Admitting: Internal Medicine

## 2022-10-26 DIAGNOSIS — E785 Hyperlipidemia, unspecified: Secondary | ICD-10-CM | POA: Diagnosis not present

## 2022-10-26 DIAGNOSIS — I340 Nonrheumatic mitral (valve) insufficiency: Secondary | ICD-10-CM | POA: Diagnosis not present

## 2022-10-26 DIAGNOSIS — I272 Pulmonary hypertension, unspecified: Secondary | ICD-10-CM | POA: Diagnosis not present

## 2022-10-26 DIAGNOSIS — Z952 Presence of prosthetic heart valve: Secondary | ICD-10-CM | POA: Insufficient documentation

## 2022-10-26 DIAGNOSIS — N189 Chronic kidney disease, unspecified: Secondary | ICD-10-CM | POA: Insufficient documentation

## 2022-10-26 DIAGNOSIS — Z8673 Personal history of transient ischemic attack (TIA), and cerebral infarction without residual deficits: Secondary | ICD-10-CM | POA: Insufficient documentation

## 2022-10-26 DIAGNOSIS — I5032 Chronic diastolic (congestive) heart failure: Secondary | ICD-10-CM | POA: Insufficient documentation

## 2022-10-26 DIAGNOSIS — I130 Hypertensive heart and chronic kidney disease with heart failure and stage 1 through stage 4 chronic kidney disease, or unspecified chronic kidney disease: Secondary | ICD-10-CM | POA: Diagnosis not present

## 2022-10-26 LAB — ECHOCARDIOGRAM COMPLETE
AR max vel: 2.07 cm2
AV Area VTI: 1.91 cm2
AV Area mean vel: 1.74 cm2
AV Mean grad: 3 mmHg
AV Peak grad: 5.8 mmHg
Ao pk vel: 1.2 m/s
Area-P 1/2: 2.44 cm2
Calc EF: 55.7 %
Est EF: 55
MV M vel: 4.63 m/s
MV Peak grad: 85.7 mmHg
MV VTI: 1.63 cm2
Radius: 0.57 cm
S' Lateral: 3.8 cm
Single Plane A2C EF: 49.8 %
Single Plane A4C EF: 60.3 %

## 2022-10-26 NOTE — Progress Notes (Signed)
*  PRELIMINARY RESULTS* Echocardiogram 2D Echocardiogram has been performed.  Katie [name] 10/26/2022, 10:05 AM

## 2022-10-28 ENCOUNTER — Other Ambulatory Visit (HOSPITAL_COMMUNITY): Payer: Self-pay

## 2022-11-09 ENCOUNTER — Other Ambulatory Visit (HOSPITAL_COMMUNITY): Payer: Self-pay

## 2022-11-16 DIAGNOSIS — I429 Cardiomyopathy, unspecified: Secondary | ICD-10-CM | POA: Diagnosis not present

## 2022-11-16 DIAGNOSIS — R413 Other amnesia: Secondary | ICD-10-CM | POA: Diagnosis not present

## 2022-11-16 DIAGNOSIS — I130 Hypertensive heart and chronic kidney disease with heart failure and stage 1 through stage 4 chronic kidney disease, or unspecified chronic kidney disease: Secondary | ICD-10-CM | POA: Diagnosis not present

## 2022-11-16 DIAGNOSIS — I5042 Chronic combined systolic (congestive) and diastolic (congestive) heart failure: Secondary | ICD-10-CM | POA: Diagnosis not present

## 2022-11-16 DIAGNOSIS — I10 Essential (primary) hypertension: Secondary | ICD-10-CM | POA: Diagnosis not present

## 2022-11-16 DIAGNOSIS — E039 Hypothyroidism, unspecified: Secondary | ICD-10-CM | POA: Diagnosis not present

## 2022-11-16 DIAGNOSIS — E7800 Pure hypercholesterolemia, unspecified: Secondary | ICD-10-CM | POA: Diagnosis not present

## 2022-11-16 DIAGNOSIS — I340 Nonrheumatic mitral (valve) insufficiency: Secondary | ICD-10-CM | POA: Diagnosis not present

## 2022-11-16 DIAGNOSIS — M87059 Idiopathic aseptic necrosis of unspecified femur: Secondary | ICD-10-CM | POA: Diagnosis not present

## 2022-11-16 DIAGNOSIS — I272 Pulmonary hypertension, unspecified: Secondary | ICD-10-CM | POA: Diagnosis not present

## 2022-11-16 DIAGNOSIS — E8582 Wild-type transthyretin-related (ATTR) amyloidosis: Secondary | ICD-10-CM | POA: Diagnosis not present

## 2022-11-16 DIAGNOSIS — N184 Chronic kidney disease, stage 4 (severe): Secondary | ICD-10-CM | POA: Diagnosis not present

## 2022-11-18 DIAGNOSIS — N184 Chronic kidney disease, stage 4 (severe): Secondary | ICD-10-CM | POA: Diagnosis not present

## 2022-11-18 DIAGNOSIS — N390 Urinary tract infection, site not specified: Secondary | ICD-10-CM | POA: Diagnosis not present

## 2022-11-18 DIAGNOSIS — I129 Hypertensive chronic kidney disease with stage 1 through stage 4 chronic kidney disease, or unspecified chronic kidney disease: Secondary | ICD-10-CM | POA: Diagnosis not present

## 2022-11-18 DIAGNOSIS — E854 Organ-limited amyloidosis: Secondary | ICD-10-CM | POA: Diagnosis not present

## 2022-11-18 DIAGNOSIS — C641 Malignant neoplasm of right kidney, except renal pelvis: Secondary | ICD-10-CM | POA: Diagnosis not present

## 2022-11-18 DIAGNOSIS — M25559 Pain in unspecified hip: Secondary | ICD-10-CM | POA: Diagnosis not present

## 2022-11-18 DIAGNOSIS — I5032 Chronic diastolic (congestive) heart failure: Secondary | ICD-10-CM | POA: Diagnosis not present

## 2022-11-18 DIAGNOSIS — I340 Nonrheumatic mitral (valve) insufficiency: Secondary | ICD-10-CM | POA: Diagnosis not present

## 2022-11-18 DIAGNOSIS — I43 Cardiomyopathy in diseases classified elsewhere: Secondary | ICD-10-CM | POA: Diagnosis not present

## 2022-11-29 ENCOUNTER — Other Ambulatory Visit: Payer: Self-pay

## 2022-12-01 ENCOUNTER — Other Ambulatory Visit: Payer: Self-pay

## 2022-12-02 ENCOUNTER — Other Ambulatory Visit: Payer: Self-pay

## 2022-12-06 ENCOUNTER — Other Ambulatory Visit: Payer: Self-pay | Admitting: Internal Medicine

## 2022-12-06 DIAGNOSIS — Z1231 Encounter for screening mammogram for malignant neoplasm of breast: Secondary | ICD-10-CM

## 2022-12-07 ENCOUNTER — Other Ambulatory Visit: Payer: Self-pay

## 2022-12-17 ENCOUNTER — Other Ambulatory Visit (HOSPITAL_COMMUNITY): Payer: Self-pay

## 2022-12-20 ENCOUNTER — Other Ambulatory Visit (HOSPITAL_COMMUNITY): Payer: Self-pay

## 2022-12-22 ENCOUNTER — Other Ambulatory Visit (HOSPITAL_COMMUNITY): Payer: Self-pay

## 2022-12-22 ENCOUNTER — Other Ambulatory Visit: Payer: Self-pay

## 2022-12-23 ENCOUNTER — Other Ambulatory Visit (HOSPITAL_COMMUNITY): Payer: Self-pay

## 2022-12-29 ENCOUNTER — Other Ambulatory Visit (HOSPITAL_COMMUNITY): Payer: Self-pay

## 2022-12-30 ENCOUNTER — Other Ambulatory Visit (HOSPITAL_COMMUNITY): Payer: Self-pay

## 2022-12-30 ENCOUNTER — Other Ambulatory Visit: Payer: Self-pay

## 2023-01-04 ENCOUNTER — Other Ambulatory Visit (HOSPITAL_COMMUNITY): Payer: Self-pay

## 2023-01-04 ENCOUNTER — Other Ambulatory Visit (HOSPITAL_COMMUNITY): Payer: Self-pay | Admitting: Cardiology

## 2023-01-05 ENCOUNTER — Ambulatory Visit: Payer: 59

## 2023-01-05 ENCOUNTER — Ambulatory Visit (HOSPITAL_COMMUNITY)
Admission: RE | Admit: 2023-01-05 | Discharge: 2023-01-05 | Disposition: A | Discharge status: Home / Self Care | Payer: 59 | Source: Ambulatory Visit | Attending: Cardiology | Admitting: Cardiology

## 2023-01-05 DIAGNOSIS — Z8673 Personal history of transient ischemic attack (TIA), and cerebral infarction without residual deficits: Secondary | ICD-10-CM | POA: Insufficient documentation

## 2023-01-05 DIAGNOSIS — E854 Organ-limited amyloidosis: Secondary | ICD-10-CM | POA: Diagnosis not present

## 2023-01-05 DIAGNOSIS — D472 Monoclonal gammopathy: Secondary | ICD-10-CM

## 2023-01-05 DIAGNOSIS — G6289 Other specified polyneuropathies: Secondary | ICD-10-CM | POA: Diagnosis not present

## 2023-01-05 DIAGNOSIS — G990 Autonomic neuropathy in diseases classified elsewhere: Secondary | ICD-10-CM | POA: Diagnosis not present

## 2023-01-05 DIAGNOSIS — G5603 Carpal tunnel syndrome, bilateral upper limbs: Secondary | ICD-10-CM | POA: Diagnosis not present

## 2023-01-05 MED ORDER — VUTRISIRAN SODIUM 25 MG/0.5ML ~~LOC~~ SOSY
25.0000 mg | PREFILLED_SYRINGE | Freq: Once | SUBCUTANEOUS | Status: AC
  Administered 2023-01-05: 25 mg via SUBCUTANEOUS

## 2023-01-05 NOTE — Progress Notes (Signed)
PCP: Dr. Nehemiah Settle HF Cardiology: Dr. Shirlee Latch  HPI:  77 y.o. with history of chronic diastolic CHF, mitral regurgitation, CVA, TTR amyloid, and CKD .    Patient was initially admitted with diastolic CHF in 01/2016. Echo showed EF 60-65% with grade 3 diastolic dysfunction and moderate MR.  She had cath showing no coronary disease. She was admitted in 01/2017 with left cerebellar CVA and LINQ monitor was placed.  TEE this admission showed moderate-severe MR.  She was admitted again in 05/2017 with acute on chronic diastolic CHF. Echo this admission showed EF 50-55%, severe LVH, restrictive diastolic dysfunction, severe MR, moderate-severe TR.  She was referred to Dr. Cornelius Moras who saw her for mitral valve evaluation.  Upon review of studies at that time, regurgitation thought to be more in the moderate range and likely functional.    Patient had PYP amyloid scan in 07/2017, this was suggestive of transthyretin amyloidosis.  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    Admission for A/C diastolic HF 2/6-2/05/2018. Required IV diuresis, then transitioned to 80 mg PO Lasix BID. Metoprolol was DC'd due to low blood pressures. Cardiac MRI strongly suggestive of amyloidosis, moderate to severe MR (moderate by regurgitant fraction 41% but severe visually). Echo repeated: EF 40-45%, moderate to severe LVH, severe MR, severe dilation of LA and RA, moderate TR, PASP 49.   She had Mitraclip x 2 placed in 11/2017.  Repeat echo post-mitraclip showed EF 55-60%, moderate LVH, mean gradient 3 mmHg across the MV with trace-mild MR.    Cardiomems was placed 12/2017.     Echo 01/2019 showed EF 55% with moderate LVH, s/p Mitraclip with mean gradient 3 mmHg, mild MR, normal RV, PASP 41 mmHg.    Echo in 06/2020 showed EF 50-55%, moderate LVH, normal RV, PASP 44 mmHg, s/p Mitraclip with mean gradient 5 mmHg with mild MR.   Follow up 09/2021, no significant neuropathic symptoms in feet on Amvuttra. Echo 09/2021  showed EF 55-60%, mild LVH, mild to moderate MR post Mitraclip.     Today she returns to HF clinic for administration of Amvuttra (vutrisiran). Last Amvuttra injection was 10/04/22.  Overall feeling well today. No contraindications to injection today. Injection administered in L arm.    Assessment/Plan: 1. Cardiac amyloidosis: Hereditary TTR amyloidosis.  She has peripheral neuropathy as well as bilateral carpal tunnel syndrome. Normal EF on 05/2017 echo with severe LVH and speckled myocardium consistent with cardiac amyloidosis.  SPEP was negative. 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 Cardiac MRI also strongly suggestive of cardiac amyloidosis.   Echo 09/2021 showed EF 55-60%.  - She is now on tafamidis, no side effects.  Receives from [employer]. - Amvuttra (vutrisiran) injection administered in clinic today. Patient tolerated injection well. Provided patient counseling on Amvuttra. Most common side effects are injection site reactions, arthralgias and dyspnea. Patient is aware to return to clinic every 3 months for repeat injection. Amvuttra will be obtained from [facility] and couriered to clinic for injection.  - Continue vitamin A supplement 8000 IU daily. Amvuttra decreases serum vitamin A levels. 2. 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  Neuropathy is FAP 1 and PND 1.  - Continue Amvuttra as above   Follow up 3 months with Pharmacy Clinic for repeat Injection (04/07/23)   Karle Plumber, PharmD, BCPS, BCCP, CPP Heart Failure Clinic Pharmacist [phone]

## 2023-01-05 NOTE — Patient Instructions (Signed)
It was a pleasure seeing you today!  MEDICATIONS: -No medication changes today -Call if you have questions about your medications.   NEXT APPOINTMENT: Return to clinic in 3 months for repeat Amvuttra injection.  In general, to take care of your heart failure: -Limit your fluid intake to 2 Liters (half-gallon) per day.   -Limit your salt intake to ideally 2-3 grams (2000-3000 mg) per day. -Weigh yourself daily and record, and bring that "weight diary" to your next appointment.  (Weight gain of 2-3 pounds in 1 day typically means fluid weight.) -The medications for your heart are to help your heart and help you live longer.   -Please contact us before stopping any of your heart medications.  Call the clinic at [phone] with questions or to reschedule future appointments.  

## 2023-01-06 ENCOUNTER — Other Ambulatory Visit (HOSPITAL_COMMUNITY): Payer: Self-pay

## 2023-01-18 ENCOUNTER — Ambulatory Visit
Admission: RE | Admit: 2023-01-18 | Discharge: 2023-01-18 | Disposition: A | Discharge status: Home / Self Care | Payer: 59 | Source: Ambulatory Visit | Attending: Internal Medicine | Admitting: Internal Medicine

## 2023-01-18 DIAGNOSIS — Z1231 Encounter for screening mammogram for malignant neoplasm of breast: Secondary | ICD-10-CM | POA: Diagnosis not present

## 2023-01-26 ENCOUNTER — Telehealth: Payer: Self-pay | Admitting: Neurology

## 2023-01-26 ENCOUNTER — Ambulatory Visit (INDEPENDENT_AMBULATORY_CARE_PROVIDER_SITE_OTHER): Payer: 59 | Admitting: Neurology

## 2023-01-26 ENCOUNTER — Encounter: Payer: Self-pay | Admitting: Neurology

## 2023-01-26 VITALS — BP 124/78 | HR 77 | Ht 62.0 in | Wt 167.0 lb

## 2023-01-26 DIAGNOSIS — G309 Alzheimer's disease, unspecified: Secondary | ICD-10-CM

## 2023-01-26 DIAGNOSIS — Z0001 Encounter for general adult medical examination with abnormal findings: Secondary | ICD-10-CM | POA: Diagnosis not present

## 2023-01-26 DIAGNOSIS — R413 Other amnesia: Secondary | ICD-10-CM

## 2023-01-26 DIAGNOSIS — F0280 Dementia in other diseases classified elsewhere without behavioral disturbance: Secondary | ICD-10-CM

## 2023-01-26 DIAGNOSIS — G629 Polyneuropathy, unspecified: Secondary | ICD-10-CM | POA: Diagnosis not present

## 2023-01-26 DIAGNOSIS — F0150 Vascular dementia without behavioral disturbance: Secondary | ICD-10-CM

## 2023-01-26 DIAGNOSIS — Z0389 Encounter for observation for other suspected diseases and conditions ruled out: Secondary | ICD-10-CM | POA: Diagnosis not present

## 2023-01-26 DIAGNOSIS — Z8673 Personal history of transient ischemic attack (TIA), and cerebral infarction without residual deficits: Secondary | ICD-10-CM

## 2023-01-26 DIAGNOSIS — R799 Abnormal finding of blood chemistry, unspecified: Secondary | ICD-10-CM | POA: Diagnosis not present

## 2023-01-26 DIAGNOSIS — Z5181 Encounter for therapeutic drug level monitoring: Secondary | ICD-10-CM | POA: Diagnosis not present

## 2023-01-26 MED ORDER — MEMANTINE HCL 10 MG PO TABS: 10.0000 mg | ORAL_TABLET | Freq: Two times a day (BID) | ORAL | 3 refills | Status: DC

## 2023-01-26 MED ORDER — MEMANTINE HCL 28 X 5 MG & 21 X 10 MG PO TABS: ORAL_TABLET | ORAL | 12 refills | Status: DC

## 2023-01-26 NOTE — Telephone Encounter (Signed)
[employer] medicare NPR sent to GI [phone] 

## 2023-01-26 NOTE — Patient Instructions (Signed)
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  I recommend further evaluation by checking memory panel labs and  MRI of the brain.  Trial of Namenda starter pack and then 10 mg twice daily if tolerated.  I encouraged her to increase participation in cognitively challenging activities like solving crossword puzzles, playing bridge and sudoku.  We also discussed memory compensation strategies.  Continue Plavix for secondary stroke prevention and maintain aggressive risk factor modification.  Continue to use a walker for ambulation follow-up with Dr. Nita Sickle for her neuropathy.  She will return for follow-up in 4 months or call earlier if necessary.  Memantine Tablets What is this medication? MEMANTINE (MEM an teen) treats memory loss and confusion (dementia) in people who have Alzheimer disease. It works by improving attention, memory, and the ability to engage in daily activities. It is not a cure for dementia or Alzheimer disease. This medicine may be used for other purposes; ask your health care provider or pharmacist if you have questions. COMMON BRAND NAME(S): Namenda What should I tell my care team before I take this medication? They need to know if you have any of these conditions: Kidney disease Liver disease Seizures Trouble passing urine An unusual or allergic reaction to memantine, other medications, foods, dyes, or preservatives Pregnant or trying to get pregnant Breast-feeding How should I use this medication? Take this medication by mouth with water. Follow the directions on the prescription label. You may take this medication with or without food. Take your doses at regular intervals. Do not take your medication more often than directed. Continue to take your medication even if you feel better. Do not stop taking except on the advice of your care  team. Talk to your care team about the use of this medication in children. Special care may be needed Overdosage: If you think you have taken too much of this medicine contact a poison control center or emergency room at once. NOTE: This medicine is only for you. Do not share this medicine with others. What if I miss a dose? If you miss a dose, take it as soon as you can. If it is almost time for your next dose, take only that dose. Do not take double or extra doses. If you do not take your medication for several days, contact your care team. Your dose may need to be changed. What may interact with this medication? Acetazolamide Amantadine Cimetidine Dextromethorphan Dofetilide Hydrochlorothiazide Ketamine Metformin Methazolamide Quinidine Ranitidine Sodium bicarbonate Triamterene This list may not describe all possible interactions. Give your health care provider a list of all the medicines, herbs, non-prescription drugs, or dietary supplements you use. Also tell them if you smoke, drink alcohol, or use illegal drugs. Some items may interact with your medicine. What should I watch for while using this medication? Visit your care team for regular checks on your progress. Check with your care team if there is no improvement in your symptoms or if they get worse. This medication may affect your coordination, reaction time, or judgment. Do not drive or operate machinery until you know how this medication affects you. Sit up or stand slowly to reduce the risk of dizzy or fainting spells. Drinking alcohol with this medication can increase the risk of these side effects. What side effects may I notice from receiving this medication? Side effects that you should report to your  care team as soon as possible: Allergic reactions--skin rash, itching, hives, swelling of the face, lips, tongue, or throat Side effects that usually do not require medical attention (report to your care team if they  continue or are bothersome): Confusion Constipation Diarrhea Dizziness Headache This list may not describe all possible side effects. Call your doctor for medical advice about side effects. You may report side effects to FDA at 1-800-FDA-1088. Where should I keep my medication? Keep out of the reach of children. Store at room temperature between 15 degrees and 30 degrees C (59 degrees and 86 degrees F). Throw away any unused medication after the expiration date. NOTE: This sheet is a summary. It may not cover all possible information. If you have questions about this medicine, talk to your doctor, pharmacist, or health care provider.  2024 Elsevier/Gold Standard (2021-08-11 00:00:00)  Dementia Caregiver Guide Dementia is a term used to describe a number of symptoms that affect memory and thinking. The most common symptoms include: Memory loss. Trouble with language and communication. Trouble concentrating. Poor judgment and problems with reasoning. Wandering from home or public places. Extreme anxiety or depression. Being suspicious or having angry outbursts and accusations. Child-like behavior and language. Dementia can be frightening and confusing. And taking care of someone with dementia can be challenging. This guide provides tips to help you when providing care for a person with dementia. How to help manage lifestyle changes Dementia usually gets worse slowly over time. In the early stages, people with dementia can stay independent and safe with some help. In later stages, they need help with daily tasks such as dressing, grooming, and using the bathroom. There are actions you can take to help a person manage his or her life while living with this condition. Communicating When the person is talking or seems frustrated, make eye contact and hold the person's hand. Ask specific questions that need yes or no answers. Use simple words, short sentences, and a calm voice. Only give one  direction at a time. When offering choices, limit the person to just one or two. Avoid correcting the person in a negative way. If the person is struggling to find the right words, gently try to help him or her. Preventing injury  Keep floors clear of clutter. Remove rugs, magazine racks, and floor lamps. Keep hallways well lit, especially at night. Put a handrail and nonslip mat in the bathtub or shower. Put childproof locks on cabinets that contain dangerous items, such as medicines, alcohol, guns, toxic cleaning items, sharp tools or utensils, matches, and lighters. For doors to the outside of the house, put the locks in places where the person cannot see or reach them easily. This will help ensure that the person does not wander out of the house and get lost. Be prepared for emergencies. Keep a list of emergency phone numbers and addresses in a convenient area. Remove car keys and lock garage doors so that the person does not try to get in the car and drive. Have the person wear a bracelet that tracks locations and identifies the person as having memory problems. This should be worn at all times for safety. Helping with daily life  Keep the person on track with his or her routine. Try to identify areas where the person may need help. Be supportive, patient, calm, and encouraging. Gently remind the person that adjusting to changes takes time. Help with the tasks that the person has asked for help with. Keep the person involved  in daily tasks and decisions as much as possible. Encourage conversation, but try not to get frustrated if the person struggles to find words or does not seem to appreciate your help. How to recognize stress Look for signs of stress in yourself and in the person you are caring for. If you notice signs of stress, take steps to manage it. Symptoms of stress include: Feeling anxious, irritable, frustrated, or angry. Denying that the person has dementia or that his or  her symptoms will not improve. Feeling depressed, hopeless, or unappreciated. Difficulty sleeping. Difficulty concentrating. Developing stress-related health problems. Feeling like you have too little time for your own life. Follow these instructions at home: Take care of your health Make sure that you and the person you are caring for: Get regular sleep. Exercise regularly. Eat regular, nutritious meals. Take over-the-counter and prescription medicines only as told by your health care providers. Drink enough fluid to keep your urine pale yellow. Attend all scheduled health care appointments.  General instructions Join a support group with others who are caregivers. Ask about respite care resources. Respite care can provide short-term care for the person so that you can have a regular break from the stress of caregiving. Consider any safety risks and take steps to avoid them. Organize medicines in a pill box for each day of the week. Create a plan to handle any legal or financial matters. Get legal or financial advice if needed. Keep a calendar in a central location to remind the person of appointments or other activities. Where to find support: Many individuals and organizations offer support. These include: Support groups for people with dementia. Support groups for caregivers. Counselors or therapists. Home health care services. Adult day care centers. Where to find more information Centers for Disease Control and Prevention: FootballExhibition.com.br Alzheimer's Association: LimitLaws.hu Family Caregiver Alliance: www.caregiver.org Alzheimer's Foundation of [place]: www.alzfdn.org Contact a health care provider if: The person's health is rapidly getting worse. You are no longer able to care for the person. Caring for the person is affecting your physical and emotional health. You are feeling depressed or anxious about caring for the person. Get help right away if: The person threatens  himself or herself, you, or anyone else. You feel depressed or sad, or feel that you want to harm yourself. If you ever feel like your loved one may hurt himself or herself or others, or if he or she shares thoughts about taking his or her own life, get help right away. You can go to your nearest emergency department or: Call your local emergency services (911 in the [place] Call a suicide crisis helpline, such as the National Suicide Prevention Lifeline at 7861113306 or 988 in the [place] This is open 24 hours a day in the [place] Text the Crisis Text Line at 905-132-9242 (in the [place] Summary Dementia is a term used to describe a number of symptoms that affect memory and thinking. Dementia usually gets worse slowly over time. Take steps to reduce the person's risk of injury and to plan for future care. Caregivers need support, relief from caregiving, and time for their own lives. This information is not intended to replace advice given to you by your health care provider. Make sure you discuss any questions you have with your health care provider. Document Revised: 02/11/2021 Document Reviewed: 12/03/2019 Elsevier Patient Education  2024 [employer].

## 2023-01-26 NOTE — Progress Notes (Signed)
[facility] [address] [place]. [place] [address] [phone]       OFFICE CONSULT NOTE  Katie [name] Date of Birth:  07-28-1946 Medical Record Number:  [id number]   Referring MD:  Renford Dills  Reason for Referral:  memory loss  HPI: Katie [name] is a 77 year old pleasant African-American lady seen today for initial office consultation visit for memory loss.  She is accompanied by her daughter Katie [name] today.  History is obtained from them and review of electronic medical records.  I personally reviewed pertinent available imaging films in PACS.  He has past medical history of amyloid cardiac disease, congestive heart failure, chronic kidney disease, hypertension, hyperlipidemia, hypothyroidism, mitral regurgitation, pulmonary hypertension.  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  Residual deficits.,  Peripheral neuropathy for which she follows up with Dr. Nita Sickle.  Patient states that she is not having significant memory or cognitive difficulties however the daughter has noticed for the last 1 year or so that she is forgetful.  She often forgets her appointments.  She is   often disoriented and confuses easily.  She is still quite independent and lives by herself.  She drives a little.  He has not exhibited any unsafe behavior.  He does get frustrated easily at times particularly when she cannot find the right or complete sentences.  Any delusions, hallucinations agitation.  Any headaches, gait or balance difficulties.  She has history of dementia and Parkinson's close as well as mother She has history of left cerebellar stroke in 2018 due to distal basilar artery occlusion she did remarkably well with mild residual difficulties in gait and balance..  She does see Dr Allena Katz local  neurologist for follow-up peripheral neuropathy.     She has had no recent falls or injuries.  She is on Plavix for stroke prevention and tolerating it well  without it.  Her blood pressure cholesterol sugar has not been under good lipid profile on 10/04/2018 echocardiogram on 10/26/22 showed ejection fraction of 55% but severely dilated left atrium.    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 s/p right nephrectomy ~2007.   History of loop recorder    Hypercholesterolemia    Hypertension    Hypothyroidism    Mitral regurgitation    Pulmonary hypertension (HCC)    Stroke (HCC)    no residual    Social History:  Social History   Socioeconomic History   Marital status: Divorced    Spouse name: Not on file   Number of children: 5   Years of education: 12   Highest education level: Not on file  Occupational History   Occupation: retired    Associate Professor: [employer]  Tobacco Use   Smoking status: Never   Smokeless tobacco: Never  Vaping Use   Vaping Use: Never used  Substance and Sexual Activity   Alcohol use: No   Drug use: No   Sexual activity: Not Currently  Other  Topics Concern   Not on file  Social History Narrative   Lives alone in a one story home.  Her son stays with her some.  Has 5 children, 10 grandchildren and 10 great grandchildren.  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  Education: high school.        07/14/18- list food insecurity but is aware of food pantries and gets $15 in food stamps, reports issues with medical bills but has insurance and gets extra help through medicaid for premiums- encouraged pt to call billing to set up reasonable payment plan so it doesn't go to collections.      Left  Handed   Social Determinants of Health   Financial Resource Strain: Low Risk  (09/04/2019)   Overall Financial Resource Strain (CARDIA)    Difficulty of Paying Living Expenses: Not hard at all  Food Insecurity: No Food Insecurity (04/02/2022)   Hunger Vital Sign    Worried About Running Out of Food in the Last Year: Never true    Ran Out of Food in the Last Year: Never true  Transportation Needs: No Transportation Needs (04/02/2022)   PRAPARE - Administrator, Civil Service (Medical): No    Lack of Transportation (Non-Medical): No  Physical Activity: Not on file  Stress: Not on file  Social Connections: Not on file  Intimate Partner Violence: Not At Risk (06/21/2019)   Humiliation, Afraid, Rape, and Kick questionnaire    Fear of Current or Ex-Partner: No    Emotionally Abused: No    Physically Abused: No    Sexually Abused: No    Medications:   Current Outpatient Medications on File Prior to Visit  Medication Sig Dispense Refill   acetaminophen (TYLENOL) 500 MG tablet Take 1,000 mg by mouth every 6 (six) hours as needed for headache (pain).     aspirin 81 MG tablet Take 1 tablet (81 mg total) daily by mouth.     butalbital-acetaminophen-caffeine (FIORICET, ESGIC) 50-325-40 MG tablet Take 1 tablet by mouth every 12 (twelve) hours as needed for headache. 14 tablet 0   carvedilol (COREG) 6.25 MG tablet Take 1 tablet (6.25 mg total) by mouth 2 (two) times daily with a meal. 180 tablet 3   clopidogrel (PLAVIX) 75 MG tablet Take 1 tablet (75 mg total) by mouth daily. 30 tablet 1   empagliflozin (JARDIANCE) 10 MG TABS tablet Take 1 tablet (10 mg total) by mouth daily before breakfast. 90 tablet 3   EPINEPHrine 0.3 mg/0.3 mL IJ SOAJ injection Inject 1 mg into the muscle as needed.     ezetimibe (ZETIA) 10 MG tablet Take 1 tablet (10 mg total) by mouth daily. 90 tablet 3   feeding supplement, ENSURE ENLIVE, (ENSURE ENLIVE) LIQD Take 237 mLs by mouth 2 (two) times daily between meals.  237 mL 0   furosemide (LASIX) 20 MG tablet TAKE 2 TABLETS BY MOUTH IN THE MORNING THEN TAKE 1 TABLET BY MOUTH EVERY EVENING 90 tablet 3   gabapentin (NEURONTIN) 300 MG capsule Take 300 mg by mouth 2 (two) times daily.     Hypromellose (ARTIFICIAL TEARS OP) Place 1 drop into both eyes daily as needed (dry eyes).     levothyroxine (SYNTHROID, LEVOTHROID) 25 MCG tablet Take 25 mcg by mouth daily before breakfast.     pantoprazole (PROTONIX) 40 MG tablet Take 1 tablet (40 mg total) by mouth daily. 90 tablet 3   potassium chloride SA (KLOR-CON) 20 MEQ tablet Take 1 tablet (20 mEq total) by  mouth daily. 60 tablet 0   REPATHA SURECLICK 140 MG/ML SOAJ ADMINISTER 140 MG(1 ML) UNDER THE SKIN EVERY 14 DAYS 2 mL 11   Tafamidis (VYNDAMAX) 61 MG CAPS TAKE 1 CAPSULE BY MOUTH ONCE A DAY 30 capsule 11   Vitamin A 3 MG (10000 UT) TABS 1 tablet     vutrisiran sodium (AMVUTTRA) 25 MG/0.5ML syringe Inject 0.5 mLs (25 mg total) into the skin every 3 (three) months. 0.5 mL 3   No current facility-administered medications on file prior to visit.    Allergies:   Allergies  Allergen Reactions   Crestor [Rosuvastatin] Other (See Comments)    Drug interaction w/ tafamidis, caused rhabdomyolysis   Tramadol Shortness Of Breath and Palpitations    Physical Exam General: well developed, well nourished pleasant elderly African-American lady, seated, in no evident distress Head: head normocephalic and atraumatic.   Neck: supple with no carotid or supraclavicular bruits Cardiovascular: regular rate and rhythm, no murmurs Musculoskeletal: no deformity Skin:  no rash/petichiae Vascular:  Normal pulses all extremities  Neurologic Exam Mental Status: Awake and fully alert. Oriented to place and time. Recent and remote memory poor. Attention span, concentration and fund of knowledge diminished. Mood and affect appropriate.  Recall 0/3.  Clock drawing 3/4.  Able to name only 6 animals which can walk on 4 legs.  Mental  status exam scored 21/30. Cranial Nerves: Fundoscopic exam reveals sharp disc margins. Pupils equal, briskly reactive to light. Extraocular movements full without nystagmus. Visual fields full to confrontation. Hearing intact. Facial sensation intact. Face, tongue, palate moves normally and symmetrically.  Motor: Normal bulk and tone. Normal strength in all tested extremity muscles.  Diminished fine finger movements on the right.  Orbits left or right upper extremity. Sensory.: intact to touch , pinprick , position and vibratory sensation.  Coordination: Rapid alternating movements normal in all extremities. Finger-to-nose and heel-to-shin performed accurately bilaterally. Gait and Station: Arises from chair without difficulty. Stance is normal. Gait is broad-based and uses a walker.  Unsteady when standing on either foot unsupported and narrow-based.  Unable to walk tandem. Reflexes: 1+ and symmetric. Toes downgoing.     01/26/2023   11:25 AM  MMSE - Mini Mental State Exam  Orientation to time 3  Orientation to Place 4  Registration 3  Attention/ Calculation 2  Recall 1  Language- name 2 objects 2  Language- repeat 1  Language- follow 3 step command 3  Language- read & follow direction 1  Write a sentence 1  Copy design 0  Total score 21      ASSESSMENT: 77 year old African-American lady with 1 year history of progressive memory and cognitive worsening likely due to mixed Alzheimer's and vascular dementia.Strong  Strong family history of Alzheimer's and remote history of cerebellar infarct with distal basilar artery occlusion in 2018 without significant residual deficits.     PLAN:I had a long discussion with the patient and her daughter regarding her memory and cognitive worsening which is likely related to mixed mild Alzheimer's and vascular dementia.  I recommend further evaluation by checking memory panel labs and  MRI of the brain.  Trial of Namenda starter pack and then 10 mg  twice daily if tolerated.  I encouraged her to increase participation in cognitively challenging activities like solving crossword puzzles, playing bridge and sudoku.  We also discussed memory compensation strategies.  Continue Plavix for secondary stroke prevention and maintain aggressive risk factor modification.  Continue to use a walker for ambulation follow-up  with Dr. Nita Sickle for her neuropathy.  She will return for follow-up in 4 months or call earlier if necessary.  Greater than 50% time during this 60-minute consultation visit were spent in counseling and coordination of care about her memory loss and dementia and remote stroke and neuropathy and answering questions  Delia Heady, MD Note: This document was prepared with digital dictation and possible smart phrase technology. Any transcriptional errors that result from this process are unintentional.

## 2023-01-27 DIAGNOSIS — I639 Cerebral infarction, unspecified: Secondary | ICD-10-CM | POA: Diagnosis not present

## 2023-01-27 DIAGNOSIS — E039 Hypothyroidism, unspecified: Secondary | ICD-10-CM | POA: Diagnosis not present

## 2023-01-27 DIAGNOSIS — I5032 Chronic diastolic (congestive) heart failure: Secondary | ICD-10-CM | POA: Diagnosis not present

## 2023-01-27 DIAGNOSIS — E7800 Pure hypercholesterolemia, unspecified: Secondary | ICD-10-CM | POA: Diagnosis not present

## 2023-01-27 DIAGNOSIS — N183 Chronic kidney disease, stage 3 unspecified: Secondary | ICD-10-CM | POA: Diagnosis not present

## 2023-01-27 DIAGNOSIS — I10 Essential (primary) hypertension: Secondary | ICD-10-CM | POA: Diagnosis not present

## 2023-01-27 LAB — DEMENTIA PANEL
Homocysteine: 33.5 umol/L — ABNORMAL HIGH (ref 0.0–19.2)
RPR Ser Ql: NONREACTIVE
TSH: 1.14 u[IU]/mL (ref 0.450–4.500)
Vitamin B-12: 585 pg/mL (ref 232–1245)

## 2023-01-28 ENCOUNTER — Other Ambulatory Visit: Payer: Self-pay | Admitting: Neurology

## 2023-01-28 MED ORDER — FOLIC ACID 1 MG PO TABS: 1.0000 mg | ORAL_TABLET | Freq: Every day | ORAL | 3 refills | Status: AC

## 2023-01-28 NOTE — Progress Notes (Signed)
Kindly inform the patient that lab work for reversible causes of memory loss shows elevated homocystine.  I recommend she start taking folic acid 1 mg daily to bring this down which should help with her memory.  We will need to repeat this lab work in about 2 to 3 months.  In case she is smoking she needs to quit smoking.

## 2023-01-31 ENCOUNTER — Telehealth: Payer: Self-pay | Admitting: Anesthesiology

## 2023-01-31 NOTE — Telephone Encounter (Signed)
-----   Message from Micki Riley, MD sent at 01/28/2023  9:18 AM EDT ----- Joneen Roach inform the patient that lab work for reversible causes of memory loss shows elevated homocystine.  I recommend she start taking folic acid 1 mg daily to bring this down which should help with her memory.  We will need to repeat this lab work in about 2 to 3 months.  In case she is smoking she needs to quit smoking.

## 2023-01-31 NOTE — Telephone Encounter (Signed)
Called pt and informed of lab results. Per Dr Pearlean Brownie Homocystine level is elevated advised to start taking Folic Acid 1 mg daily to help bring this down. Per Dr Pearlean Brownie advised pt to stop smoking if she is smoking, also to repeat labs in 2-3 months. Pt had no questions at this time but was encouraged to call back if questions arise.

## 2023-02-01 ENCOUNTER — Other Ambulatory Visit (HOSPITAL_COMMUNITY): Payer: Self-pay

## 2023-02-02 ENCOUNTER — Other Ambulatory Visit: Payer: 59

## 2023-02-04 ENCOUNTER — Other Ambulatory Visit: Payer: Self-pay

## 2023-02-09 ENCOUNTER — Ambulatory Visit (INDEPENDENT_AMBULATORY_CARE_PROVIDER_SITE_OTHER): Payer: 59 | Admitting: Neurology

## 2023-02-09 DIAGNOSIS — R4182 Altered mental status, unspecified: Secondary | ICD-10-CM

## 2023-02-09 DIAGNOSIS — R413 Other amnesia: Secondary | ICD-10-CM

## 2023-02-10 ENCOUNTER — Telehealth: Payer: Self-pay | Admitting: Neurology

## 2023-02-10 DIAGNOSIS — M25552 Pain in left hip: Secondary | ICD-10-CM | POA: Diagnosis not present

## 2023-02-10 DIAGNOSIS — M25551 Pain in right hip: Secondary | ICD-10-CM | POA: Diagnosis not present

## 2023-02-10 DIAGNOSIS — M160 Bilateral primary osteoarthritis of hip: Secondary | ICD-10-CM | POA: Diagnosis not present

## 2023-02-10 NOTE — Telephone Encounter (Addendum)
Pt's daughter, Sharon Rubis (do not have DPR on file) asking for patient reason need to take folic acid (FOLVITE) 1 MG tablet

## 2023-02-10 NOTE — Telephone Encounter (Signed)
Called patient and informed her that Folic acid is used as a vitamin B and it help with Brain and spine healthy cell growth. Patient is very hesitated to take the Vit B due to her already taking to much medication. I in formed pt to speak with her daughter to discuss what's best for them both. Pt verbalized understanding. Pt had no questions at this time but was encouraged to call back if questions arise.  Spoke with patient daughter and informed her that the Rx was a Vit B and she needs to discuss with her mother about taking the Rx. Pt verbalized understanding. Pt had no questions at this time but was encouraged to call back if questions arise.

## 2023-02-12 ENCOUNTER — Other Ambulatory Visit (HOSPITAL_COMMUNITY): Payer: Self-pay | Admitting: Cardiology

## 2023-02-14 ENCOUNTER — Telehealth: Payer: Self-pay | Admitting: *Deleted

## 2023-02-14 NOTE — Telephone Encounter (Signed)
   Pre-operative Risk Assessment    Patient Name: Katie [name]  DOB: 1946/01/18 MRN: [id number]      Request for Surgical Clearance    Procedure:   LEFT TOTAL ANTERIOR HIP ARTHROPLASTY  Date of Surgery:  Clearance TBD                                 Surgeon:  DR. Jodi Geralds Surgeon's Group or Practice Name:  [facility] Phone number:  [phone] ATTN: Emilio Aspen Fax number:  [phone]   Type of Clearance Requested:   - Medical  - Pharmacy:  Hold Aspirin and Clopidogrel (Plavix)     Type of Anesthesia:  Spinal   Additional requests/questions:    Elpidio Anis   02/14/2023, 4:43 PM

## 2023-02-15 ENCOUNTER — Telehealth: Payer: Self-pay

## 2023-02-15 ENCOUNTER — Telehealth: Payer: Self-pay | Admitting: *Deleted

## 2023-02-15 NOTE — Telephone Encounter (Signed)
Pt has been scheduled a tele visit 02/18/23 2:40.  Consent on file / medications reconciled.

## 2023-02-15 NOTE — Telephone Encounter (Signed)
   Name: Katie [name]  DOB: 07-28-1946  MRN: [id number]  Primary Cardiologist: Marca Ancona, MD   Preoperative team, please contact this patient and set up a phone call appointment for further preoperative risk assessment. Please obtain consent and complete medication review. Thank you for your help.  I confirm that guidance regarding antiplatelet and oral anticoagulation therapy has been completed and, if necessary, noted below.  Aspirin can be held for 5-7 days prior to procedure per protocol. Please resume as soon as safe to do so after procedure.  Recommendations for Plavix will need to come from Neurology.    Rip Harbour, NP 02/15/2023, 1:07 PM [facility]

## 2023-02-15 NOTE — Telephone Encounter (Signed)
Pt has been scheduled a tele visit 02/18/23 2:40.  Consent on file / medications reconciled.    Patient Consent for Virtual Visit        DARCEE [name] has provided verbal consent on 02/15/2023 for a virtual visit (video or telephone).   CONSENT FOR VIRTUAL VISIT FOR:  Katie [name]  By participating in this virtual visit I agree to the following:  I hereby voluntarily request, consent and authorize [facility] and its employed or contracted physicians, physician assistants, nurse practitioners or other licensed health care professionals (the Practitioner), to provide me with telemedicine health care services (the "Services") as deemed necessary by the treating Practitioner. I acknowledge and consent to receive the Services by the Practitioner via telemedicine. I understand that the telemedicine visit will involve communicating with the Practitioner through live audiovisual communication technology and the disclosure of certain medical information by electronic transmission. I acknowledge that I have been given the opportunity to request an in-person assessment or other available alternative prior to the telemedicine visit and am voluntarily participating in the telemedicine visit.  I understand that I have the right to withhold or withdraw my consent to the use of telemedicine in the course of my care at any time, without affecting my right to future care or treatment, and that the Practitioner or I may terminate the telemedicine visit at any time. I understand that I have the right to inspect all information obtained and/or recorded in the course of the telemedicine visit and may receive copies of available information for a reasonable fee.  I understand that some of the potential risks of receiving the Services via telemedicine include:  Delay or interruption in medical evaluation due to technological equipment failure or disruption; Information transmitted may not be sufficient (e.g. poor  resolution of images) to allow for appropriate medical decision making by the Practitioner; and/or  In rare instances, security protocols could fail, causing a breach of personal health information.  Furthermore, I acknowledge that it is my responsibility to provide information about my medical history, conditions and care that is complete and accurate to the best of my ability. I acknowledge that Practitioner's advice, recommendations, and/or decision may be based on factors not within their control, such as incomplete or inaccurate data provided by me or distortions of diagnostic images or specimens that may result from electronic transmissions. I understand that the practice of medicine is not an exact science and that Practitioner makes no warranties or guarantees regarding treatment outcomes. I acknowledge that a copy of this consent can be made available to me via my patient portal [facility] MyChart), or I can request a printed copy by calling the office of [facility].    I understand that my insurance will be billed for this visit.   I have read or had this consent read to me. I understand the contents of this consent, which adequately explains the benefits and risks of the Services being provided via telemedicine.  I have been provided ample opportunity to ask questions regarding this consent and the Services and have had my questions answered to my satisfaction. I give my informed consent for the services to be provided through the use of telemedicine in my medical care

## 2023-02-15 NOTE — Telephone Encounter (Signed)
Received faxes from [facility] for an Surgical clearance for patient and needs an signature. Forms will be placed on Dr Pearlean Brownie inbox.

## 2023-02-18 ENCOUNTER — Ambulatory Visit: Payer: 59

## 2023-02-18 NOTE — Telephone Encounter (Signed)
   Patient Name: Katie [name]  DOB: October 09, 1945 MRN: [id number]  Primary Cardiologist: Marca Ancona, MD  Chart reviewed as part of pre-operative protocol coverage.  Pt's primary cardiologist is Dr. Shirlee Latch with [facility]. Pt was last seen in the office on 10/04/2022 by Dr. Shirlee Latch with recommendation for follow-up office visit in 4 months. Pt is now pending surgery (hip replacement). Based on prior recommendations for follow-up, will cancel today's telephone visit for preop clearance and schedule pt for office visit with [facility] clinic APP as pt is due for in office follow-up.  Surgical clearance to be addressed at the time of OV.    Joylene Grapes, NP 02/18/2023, 1:53 PM

## 2023-02-18 NOTE — Telephone Encounter (Signed)
Per Preop APP Bernadene Person NP, patient needs in office appt with Dr. Shirlee Latch at the [facility]. Per APP, tele appt canceled for today pt aware. Patient aware [facility] will call her with appt. Will update the surgeon's office.

## 2023-02-27 NOTE — Progress Notes (Signed)
Kindly inform patient that EEG or brainwave study was normal

## 2023-02-28 ENCOUNTER — Telehealth: Payer: Self-pay | Admitting: Anesthesiology

## 2023-02-28 ENCOUNTER — Other Ambulatory Visit (HOSPITAL_COMMUNITY): Payer: Self-pay

## 2023-02-28 DIAGNOSIS — G309 Alzheimer's disease, unspecified: Secondary | ICD-10-CM | POA: Diagnosis not present

## 2023-02-28 DIAGNOSIS — M87059 Idiopathic aseptic necrosis of unspecified femur: Secondary | ICD-10-CM | POA: Diagnosis not present

## 2023-02-28 DIAGNOSIS — I429 Cardiomyopathy, unspecified: Secondary | ICD-10-CM | POA: Diagnosis not present

## 2023-02-28 NOTE — Telephone Encounter (Signed)
-----   Message from Delia Heady sent at 02/27/2023  4:06 PM EDT ----- Joneen Roach inform patient that EEG or brainwave study was normal

## 2023-03-01 NOTE — Telephone Encounter (Signed)
Requesting office sent over a duplicate request. Pt has appt with Dr. Shirlee Latch 03/02/23. Once Dr. Shirlee Latch has cleared the pt he will have his nurse/cma fax over his notes providing clearance and recommendations.

## 2023-03-02 ENCOUNTER — Encounter (HOSPITAL_COMMUNITY): Payer: 59 | Admitting: Cardiology

## 2023-03-07 ENCOUNTER — Encounter (HOSPITAL_COMMUNITY): Payer: Self-pay

## 2023-03-07 ENCOUNTER — Emergency Department (HOSPITAL_COMMUNITY): Payer: 59

## 2023-03-07 ENCOUNTER — Other Ambulatory Visit: Payer: Self-pay

## 2023-03-07 ENCOUNTER — Other Ambulatory Visit (HOSPITAL_COMMUNITY): Payer: Self-pay

## 2023-03-07 ENCOUNTER — Emergency Department (HOSPITAL_COMMUNITY)
Admission: EM | Admit: 2023-03-07 | Discharge: 2023-03-07 | Disposition: A | Discharge status: Home / Self Care | Payer: 59 | Attending: Student | Admitting: Student

## 2023-03-07 DIAGNOSIS — Z7902 Long term (current) use of antithrombotics/antiplatelets: Secondary | ICD-10-CM | POA: Diagnosis not present

## 2023-03-07 DIAGNOSIS — M5137 Other intervertebral disc degeneration, lumbosacral region: Secondary | ICD-10-CM | POA: Diagnosis not present

## 2023-03-07 DIAGNOSIS — M25552 Pain in left hip: Secondary | ICD-10-CM | POA: Diagnosis not present

## 2023-03-07 DIAGNOSIS — Z7984 Long term (current) use of oral hypoglycemic drugs: Secondary | ICD-10-CM | POA: Diagnosis not present

## 2023-03-07 DIAGNOSIS — M1612 Unilateral primary osteoarthritis, left hip: Secondary | ICD-10-CM | POA: Diagnosis not present

## 2023-03-07 DIAGNOSIS — Z79899 Other long term (current) drug therapy: Secondary | ICD-10-CM | POA: Insufficient documentation

## 2023-03-07 DIAGNOSIS — I5042 Chronic combined systolic (congestive) and diastolic (congestive) heart failure: Secondary | ICD-10-CM | POA: Diagnosis not present

## 2023-03-07 DIAGNOSIS — M159 Polyosteoarthritis, unspecified: Secondary | ICD-10-CM

## 2023-03-07 DIAGNOSIS — Z85528 Personal history of other malignant neoplasm of kidney: Secondary | ICD-10-CM | POA: Diagnosis not present

## 2023-03-07 DIAGNOSIS — E039 Hypothyroidism, unspecified: Secondary | ICD-10-CM | POA: Insufficient documentation

## 2023-03-07 DIAGNOSIS — N183 Chronic kidney disease, stage 3 unspecified: Secondary | ICD-10-CM | POA: Diagnosis not present

## 2023-03-07 DIAGNOSIS — M545 Low back pain, unspecified: Secondary | ICD-10-CM | POA: Insufficient documentation

## 2023-03-07 DIAGNOSIS — Z7982 Long term (current) use of aspirin: Secondary | ICD-10-CM | POA: Insufficient documentation

## 2023-03-07 DIAGNOSIS — I130 Hypertensive heart and chronic kidney disease with heart failure and stage 1 through stage 4 chronic kidney disease, or unspecified chronic kidney disease: Secondary | ICD-10-CM | POA: Diagnosis not present

## 2023-03-07 DIAGNOSIS — M47816 Spondylosis without myelopathy or radiculopathy, lumbar region: Secondary | ICD-10-CM | POA: Diagnosis not present

## 2023-03-07 MED ORDER — OXYCODONE HCL 5 MG PO TABS
5.0000 mg | ORAL_TABLET | Freq: Once | ORAL | Status: AC
  Administered 2023-03-07: 5 mg via ORAL
  Filled 2023-03-07: qty 1

## 2023-03-07 MED ORDER — ACETAMINOPHEN 500 MG PO TABS
1000.0000 mg | ORAL_TABLET | Freq: Once | ORAL | Status: AC
  Administered 2023-03-07: 1000 mg via ORAL
  Filled 2023-03-07: qty 2

## 2023-03-07 MED ORDER — OXYCODONE HCL 5 MG PO TABS: 5.0000 mg | ORAL_TABLET | Freq: Four times a day (QID) | ORAL | 0 refills | Status: DC | PRN

## 2023-03-07 NOTE — ED Notes (Signed)
Patient transported to X-ray 

## 2023-03-07 NOTE — Discharge Instructions (Signed)
For pain:  - Acetaminophen 1000 mg three times daily (every 8 hours) - oxycodone for breakthrough pain only

## 2023-03-07 NOTE — ED Triage Notes (Signed)
Pt c/o left hip painx4d. Pt denies injury. Pt c/o numbness and tingling in left lower back that goes down her left leg. Pt was able to walk to hall bed from waiting room with walker. Pt denies Loss of bowel or bladder.

## 2023-03-07 NOTE — ED Notes (Signed)
Pt is back from xray. She is laying in bed, covered with blanket and side rails up x 2. Pt is rubbing her left hip and thigh and states that she is hurting. I advised her that I would speak with Provider.

## 2023-03-07 NOTE — ED Provider Notes (Signed)
[place] EMERGENCY DEPARTMENT AT [facility] Provider Note  CSN: [id number] Arrival date & time: 03/07/23 4098  Chief Complaint(s) Hip Pain and Back Pain  HPI Katie [name] is a 77 y.o. female with PMH cardiac amyloid, CKD 3, renal cell carcinoma status post nephrectomy, previous CVA who presents emergency room for evaluation of left hip pain.  Patient states that she follows with outpatient orthopedics and receives steroid injections monthly for known avascular necrosis bilaterally of the hips.  She is currently following with her primary care physician to get clearance for possible hip replacement.  She has been taking Tylenol twice daily at home without relief.  Denies numbness, tingling, weakness, saddle anesthesia, loss of bowel or bladder or other red flag signs of back pain.   Past Medical History Past Medical History:  Diagnosis Date   Amyloid heart disease (HCC)    Cancer (HCC)    kidney cancer   Chronic combined systolic and diastolic CHF (congestive heart failure) (HCC)    CKD (chronic kidney disease), stage III (HCC)    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 s/p right nephrectomy ~2007.   History of loop recorder    Hypercholesterolemia    Hypertension    Hypothyroidism    Mitral regurgitation    Pulmonary hypertension (HCC)    Stroke [facility])    no residual   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   Severe mitral regurgitation 11/30/2017    Amyloid heart disease (HCC)    Myopericarditis 09/06/2017   Non-rheumatic mitral regurgitation    Chronic diastolic congestive heart failure (HCC) 05/08/2017   CHF exacerbation (HCC) 05/08/2017   Chronic migraine without aura without status migrainosus, not intractable    Hypertension 02/15/2017   Basilar artery occlusion    Cerebellar stroke (HCC) 02/10/2017   Headache 02/10/2017   Chest pain    Mitral regurgitation 01/08/2016   Moderate tricuspid regurgitation 01/08/2016   Pulmonary HTN (HCC) 01/08/2016   Acute CHF (congestive heart failure) (HCC) 01/07/2016   Elevated transaminase level 01/07/2016   Mild hypertension 01/07/2016   CKD (chronic kidney disease), stage III (HCC) 01/07/2016   Hyperlipidemia 01/07/2016   Acute CHF (HCC) 01/07/2016   Home Medication(s) Prior to Admission medications   Medication Sig Start Date End Date Taking? Authorizing Provider  acetaminophen (TYLENOL) 500 MG tablet Take 1,000 mg by mouth every 6 (six) hours as needed for headache (pain).    [provider]  aspirin 81 MG tablet Take 1 tablet (81 mg total) daily by mouth. 06/14/17   Laurey Morale, MD  butalbital-acetaminophen-caffeine (FIORICET, ESGIC) [phone] MG tablet Take 1 tablet by mouth every 12 (twelve) hours as needed for headache. 02/17/17   Joseph Art, DO  carvedilol (COREG) 6.25 MG tablet Take 1 tablet (6.25 mg total) by mouth 2 (two) times daily with a meal. 07/07/22   [place], Anderson Malta, FNP  clopidogrel (PLAVIX) 75 MG tablet Take 1 tablet (75  mg total) by mouth daily. 02/13/17   Lahoma Crocker, MD  empagliflozin (JARDIANCE) 10 MG TABS tablet TAKE 1 TABLET(10 MG) BY MOUTH DAILY BEFORE BREAKFAST 02/14/23   Laurey Morale, MD  EPINEPHrine 0.3 mg/0.3 mL IJ SOAJ injection Inject 1 mg into the muscle as needed. 03/12/21   [provider]  ezetimibe (ZETIA) 10 MG tablet Take 1 tablet (10 mg total) by mouth daily. 09/03/21   Laurey Morale, MD  feeding supplement,  ENSURE ENLIVE, (ENSURE ENLIVE) LIQD Take 237 mLs by mouth 2 (two) times daily between meals. 02/17/17   Joseph Art, DO  folic acid (FOLVITE) 1 MG tablet Take 1 tablet (1 mg total) by mouth daily. 01/28/23 01/28/24  Micki Riley, MD  furosemide (LASIX) 20 MG tablet TAKE 2 TABLETS BY MOUTH IN THE MORNING THEN TAKE 1 TABLET BY MOUTH EVERY EVENING 01/04/23   Laurey Morale, MD  gabapentin (NEURONTIN) 300 MG capsule Take 300 mg by mouth 2 (two) times daily.    [provider]  Hypromellose (ARTIFICIAL TEARS OP) Place 1 drop into both eyes daily as needed (dry eyes).    [provider]  levothyroxine (SYNTHROID, LEVOTHROID) 25 MCG tablet Take 25 mcg by mouth daily before breakfast.    [provider]  memantine (NAMENDA) 10 MG tablet Take 1 tablet (10 mg total) by mouth 2 (two) times daily. 01/26/23   Micki Riley, MD  pantoprazole (PROTONIX) 40 MG tablet Take 1 tablet (40 mg total) by mouth daily. 04/07/22   Laurey Morale, MD  potassium chloride SA (KLOR-CON) 20 MEQ tablet Take 1 tablet (20 mEq total) by mouth daily. 06/16/20   Zannie Cove, MD  REPATHA SURECLICK 140 MG/ML SOAJ ADMINISTER 140 MG(1 ML) UNDER THE SKIN EVERY 14 DAYS 03/17/22   Laurey Morale, MD  Tafamidis [facility]) 61 MG CAPS TAKE 1 CAPSULE BY MOUTH ONCE A DAY 05/25/22 05/25/23  Laurey Morale, MD  Vitamin A 3 MG (10000 UT) TABS 1 tablet    [provider]  vutrisiran sodium (AMVUTTRA) 25 MG/0.5ML syringe Inject 0.5 mLs (25 mg total) into the skin every 3 (three) months. 06/28/22   Laurey Morale, MD                                                                                                                                    Past Surgical History Past Surgical History:  Procedure Laterality Date   BREAST EXCISIONAL BIOPSY Left    BREAST EXCISIONAL BIOPSY Right    CARDIAC CATHETERIZATION N/A 01/12/2016   Procedure: Right/Left Heart Cath and Coronary/Graft Angiography;  Surgeon:  Lyn Records, MD;  Location: [facility] INVASIVE CV LAB;  Service: Cardiovascular;  Laterality: N/A;   CERVICAL POLYPECTOMY N/A 04/23/2013   Procedure: CERVICAL POLYPECTOMY;  Surgeon: Dorien Chihuahua. Richardson Dopp, MD;  Location: [facility] ORS;  Service: Gynecology;  Laterality: N/A;  Possible Polypectomy  COLONOSCOPY WITH PROPOFOL N/A 12/19/2020   Procedure: COLONOSCOPY WITH PROPOFOL;  Surgeon: Jeani Hawking, MD;  Location: [facility] ENDOSCOPY;  Service: Endoscopy;  Laterality: N/A;   HYSTEROSCOPY WITH D & C N/A 04/23/2013   Procedure: DILATATION AND CURETTAGE /HYSTEROSCOPY;  Surgeon: Dorien Chihuahua. Richardson Dopp, MD;  Location: [facility] ORS;  Service: Gynecology;  Laterality: N/A;   IR ANGIO INTRA EXTRACRAN SEL COM CAROTID INNOMINATE BILAT MOD SED  02/11/2017   IR ANGIO VERTEBRAL SEL VERTEBRAL BILAT MOD SED  02/11/2017   KNEE ARTHROSCOPY Left    LOOP RECORDER INSERTION N/A 02/18/2017   Procedure: Loop Recorder Insertion;  Surgeon: Regan Lemming, MD;  Location: [facility] INVASIVE CV LAB;  Service: Cardiovascular;  Laterality: N/A;   MITRAL VALVE REPAIR  11/30/2017   MITRAL VALVE REPAIR N/A 11/30/2017   Procedure: MITRAL VALVE REPAIR;  Surgeon: Tonny Bollman, MD;  Location: [facility] INVASIVE CV LAB;  Service: Cardiovascular;  Laterality: N/A;   NEPHRECTOMY Right    due to carcinoma   POLYPECTOMY  12/19/2020   Procedure: POLYPECTOMY;  Surgeon: Jeani Hawking, MD;  Location: [facility] ENDOSCOPY;  Service: Endoscopy;;   TEE WITHOUT CARDIOVERSION N/A 02/18/2017   Procedure: TRANSESOPHAGEAL ECHOCARDIOGRAM (TEE) WITH LOOP;  Surgeon: Vesta Mixer, MD;  Location: [facility] ENDOSCOPY;  Service: Cardiovascular;  Laterality: N/A;   Family History Family History  Problem Relation Age of Onset   Stroke Brother    Diabetes Mellitus II Brother    Diabetes Mellitus II Sister    Breast cancer Sister    Colon cancer Sister     Social History Social History   Tobacco Use   Smoking status: Never   Smokeless tobacco: Never  Vaping Use   Vaping status: Never Used  Substance Use  Topics   Alcohol use: No   Drug use: No   Allergies Crestor [rosuvastatin] and Tramadol  Review of Systems Review of Systems  Musculoskeletal:  Positive for arthralgias.    Physical Exam Vital Signs  I have reviewed the triage vital signs BP (!) 120/96   Pulse 88   Temp 97.6 F (36.4 C) (Oral)   Resp 18   Ht 5\' 2"  (1.575 m)   Wt 75.8 kg   LMP 02/12/2013   SpO2 100%   BMI 30.56 kg/m   Physical Exam Vitals and nursing note reviewed.  Constitutional:      General: She is not in acute distress.    Appearance: She is well-developed.  HENT:     Head: Normocephalic and atraumatic.  Eyes:     Conjunctiva/sclera: Conjunctivae normal.  Cardiovascular:     Rate and Rhythm: Normal rate and regular rhythm.     Heart sounds: No murmur heard. Pulmonary:     Effort: Pulmonary effort is normal. No respiratory distress.     Breath sounds: Normal breath sounds.  Abdominal:     Palpations: Abdomen is soft.     Tenderness: There is no abdominal tenderness.  Musculoskeletal:        General: Tenderness present. No swelling.     Cervical back: Neck supple.  Skin:    General: Skin is warm and dry.     Capillary Refill: Capillary refill takes less than 2 seconds.  Neurological:     Mental Status: She is alert.  Psychiatric:        Mood and Affect: Mood normal.     ED Results and Treatments Labs (all labs ordered are listed, but only abnormal results are displayed) Labs Reviewed - No data to display  Radiology DG Lumbar Spine Complete  Result Date: 03/07/2023 CLINICAL DATA:  Pain EXAM: LUMBAR SPINE - COMPLETE 4+ VIEW COMPARISON:  04/28/2018 FINDINGS: There is no evidence of lumbar spine fracture. Alignment is normal. Disc height loss and endplate spurring at L5-S1. Remaining disc heights are relatively well preserved. Mild lower lumbar facet arthropathy. Severe  degenerative changes of both hips. IMPRESSION: 1. Similar degenerative disc disease at L5-S1. No acute findings. 2. Severe degenerative changes of both hips. Electronically Signed   By: Duanne Guess D.O.   On: 03/07/2023 08:58   DG Hip Unilat W or Wo Pelvis 2-3 Views Left  Result Date: 03/07/2023 CLINICAL DATA:  Left hip pain without known injury. EXAM: DG HIP (WITH OR WITHOUT PELVIS) 2-3V LEFT COMPARISON:  None Available. FINDINGS: There is no evidence of hip fracture or dislocation. Severe narrowing and osteophyte formation is seen involving the left hip joint consistent with severe osteoarthritis. Lucency is seen in left femoral head is well suggesting avascular necrosis. IMPRESSION: Severe osteoarthritis of left hip as well as possible avascular necrosis of left femoral head. No acute abnormality seen. Electronically Signed   By: Lupita Raider M.D.   On: 03/07/2023 08:56    Pertinent labs & imaging results that were available during my care of the patient were reviewed by me and considered in my medical decision making (see MDM for details).  Medications Ordered in ED Medications - No data to display                                                                                                                                   Procedures Procedures  (including critical care time)  Medical Decision Making / ED Course   This patient presents to the ED for concern of hip pain, this involves an extensive number of treatment options, and is a complaint that carries with it a high risk of complications and morbidity.  The differential diagnosis includes avascular necrosis, osteoarthritis, septic joint, gout, fracture, dislocation  MDM: Patient seen emergency room for evaluation of hip and back pain.  Physical exam with tenderness over bilateral hips, straight leg positive bilaterally.  No significant tenderness in the L-spine.  X-ray imaging of the hip showing severe osteoarthritis with  possible avascular necrosis which is already known to this patient, degenerative disc disease at L5-S1 but no acute findings on lumbar x-rays.  She was pain controlled with Tylenol and oxycodone and at this time she does not meet inpatient criteria for admission.  She will need to follow-up outpatient with orthopedics and will likely need bilateral hip replacements.  She is able to ambulate emergency department with appropriate pain control and I will provide a short prescription for pain medicine to help bridge the patient between now and her orthopedic appointments.  Patient discharged with outpatient follow-up   Additional history obtained: -Additional history obtained from multiple family members -External records from outside source obtained  and reviewed including: Chart review including previous notes, labs, imaging, consultation notes      Imaging Studies ordered: I ordered imaging studies including lumbar and hip x-rays I independently visualized and interpreted imaging. I agree with the radiologist interpretation   Medicines ordered and prescription drug management: No orders of the defined types were placed in this encounter.   -I have reviewed the patients home medicines and have made adjustments as needed  Critical interventions none    Social Determinants of Health:  Factors impacting patients care include: none   Reevaluation: After the interventions noted above, I reevaluated the patient and found that they have :improved  Co morbidities that complicate the patient evaluation  Past Medical History:  Diagnosis Date   Amyloid heart disease (HCC)    Cancer (HCC)    kidney cancer   Chronic combined systolic and diastolic CHF (congestive heart failure) (HCC)    CKD (chronic kidney disease), stage III (HCC)    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 s/p right nephrectomy ~2007.   History of loop recorder     Hypercholesterolemia    Hypertension    Hypothyroidism    Mitral regurgitation    Pulmonary hypertension (HCC)    Stroke (HCC)    no residual      Dispostion: I considered admission for this patient, but at this time she does not meet inpatient criteria for admission and she is safe for discharge with outpatient follow-up     Final Clinical Impression(s) / ED Diagnoses Final diagnoses:  None     @PCDICTATION @    Glendora Score, MD 03/07/23 2032

## 2023-03-07 NOTE — ED Notes (Signed)
Pt is a&ox4, warm and dry to touch. Pt is sitting on side of bed with walker in front of her. Pt states that for the last two days she has been experiencing left hip and thigh pain. She said pain began at her lower back on left side. Pt denies any trauma or injury to site. Pt denies numbness/tingling. PMS intact. Family at the bedside.

## 2023-03-09 ENCOUNTER — Other Ambulatory Visit (HOSPITAL_COMMUNITY): Payer: Self-pay | Admitting: Cardiology

## 2023-03-14 ENCOUNTER — Ambulatory Visit (HOSPITAL_COMMUNITY)
Admission: RE | Admit: 2023-03-14 | Discharge: 2023-03-14 | Disposition: A | Discharge status: Home / Self Care | Payer: 59 | Source: Ambulatory Visit | Attending: Cardiology | Admitting: Cardiology

## 2023-03-14 VITALS — BP 118/58 | HR 82 | Wt 169.1 lb

## 2023-03-14 DIAGNOSIS — N183 Chronic kidney disease, stage 3 unspecified: Secondary | ICD-10-CM | POA: Diagnosis not present

## 2023-03-14 DIAGNOSIS — M160 Bilateral primary osteoarthritis of hip: Secondary | ICD-10-CM | POA: Diagnosis not present

## 2023-03-14 DIAGNOSIS — G629 Polyneuropathy, unspecified: Secondary | ICD-10-CM | POA: Diagnosis not present

## 2023-03-14 DIAGNOSIS — Z7902 Long term (current) use of antithrombotics/antiplatelets: Secondary | ICD-10-CM | POA: Diagnosis not present

## 2023-03-14 DIAGNOSIS — E785 Hyperlipidemia, unspecified: Secondary | ICD-10-CM | POA: Diagnosis not present

## 2023-03-14 DIAGNOSIS — E854 Organ-limited amyloidosis: Secondary | ICD-10-CM | POA: Diagnosis not present

## 2023-03-14 DIAGNOSIS — I5032 Chronic diastolic (congestive) heart failure: Secondary | ICD-10-CM | POA: Diagnosis not present

## 2023-03-14 DIAGNOSIS — I5022 Chronic systolic (congestive) heart failure: Secondary | ICD-10-CM | POA: Diagnosis not present

## 2023-03-14 DIAGNOSIS — Z8673 Personal history of transient ischemic attack (TIA), and cerebral infarction without residual deficits: Secondary | ICD-10-CM | POA: Diagnosis not present

## 2023-03-14 DIAGNOSIS — I130 Hypertensive heart and chronic kidney disease with heart failure and stage 1 through stage 4 chronic kidney disease, or unspecified chronic kidney disease: Secondary | ICD-10-CM | POA: Insufficient documentation

## 2023-03-14 DIAGNOSIS — E782 Mixed hyperlipidemia: Secondary | ICD-10-CM

## 2023-03-14 DIAGNOSIS — I43 Cardiomyopathy in diseases classified elsewhere: Secondary | ICD-10-CM | POA: Insufficient documentation

## 2023-03-14 DIAGNOSIS — E038 Other specified hypothyroidism: Secondary | ICD-10-CM | POA: Diagnosis not present

## 2023-03-14 DIAGNOSIS — I340 Nonrheumatic mitral (valve) insufficiency: Secondary | ICD-10-CM | POA: Insufficient documentation

## 2023-03-14 LAB — BASIC METABOLIC PANEL
Anion gap: 12 (ref 5–15)
BUN: 21 mg/dL (ref 8–23)
CO2: 22 mmol/L (ref 22–32)
Calcium: 9.4 mg/dL (ref 8.9–10.3)
Chloride: 105 mmol/L (ref 98–111)
Creatinine, Ser: 1.92 mg/dL — ABNORMAL HIGH (ref 0.44–1.00)
GFR, Estimated: 27 mL/min — ABNORMAL LOW (ref >60)
Glucose, Bld: 91 mg/dL (ref 70–99)
Potassium: 4.1 mmol/L (ref 3.5–5.1)
Sodium: 139 mmol/L (ref 135–145)

## 2023-03-14 LAB — BRAIN NATRIURETIC PEPTIDE: B Natriuretic Peptide: 181.1 pg/mL — ABNORMAL HIGH (ref 0.0–100.0)

## 2023-03-14 NOTE — Patient Instructions (Signed)
Medication Changes:  None, Continue current medications  Lab Work:  Labs done today, your results will be available in MyChart, we will contact you for abnormal readings.  Testing/Procedures:  none  Referrals:  You have been referred to follow-up with [facility] regarding your cholesterol levels, they will call you  Special Instructions // Education:  Do the following things EVERYDAY: Weigh yourself in the morning before breakfast. Write it down and keep it in a log. Take your medicines as prescribed Eat low salt foods--Limit salt (sodium) to 2000 mg per day.  Stay as active as you can everyday Limit all fluids for the day to less than 2 liters   Follow-Up in: 4 months  At the [facility], you and your health needs are our priority. We have a designated team specialized in the treatment of Heart Failure. This Care Team includes your primary Heart Failure Specialized Cardiologist (physician), Advanced Practice Providers (APPs- Physician Assistants and Nurse Practitioners), and Pharmacist who all work together to provide you with the care you need, when you need it.   You may see any of the following providers on your designated Care Team at your next follow up:  Dr. Arvilla Meres Dr. Marca Ancona Dr. Marcos Eke, NP Robbie Lis, [place] [facility], [place] Brynda Peon, NP Karle Plumber, PharmD   Please be sure to bring in all your medications bottles to every appointment.   Need to Contact us:  If you have any questions or concerns before your next appointment please send [place] a message through [place] or call our office at [phone].    TO LEAVE A MESSAGE FOR THE NURSE SELECT OPTION 2, PLEASE LEAVE A MESSAGE INCLUDING: YOUR NAME DATE OF BIRTH CALL BACK NUMBER REASON FOR CALL**this is important as we prioritize the call backs  YOU WILL RECEIVE A CALL BACK THE SAME DAY AS LONG AS YOU CALL BEFORE 4:00 PM

## 2023-03-14 NOTE — Progress Notes (Signed)
PCP: Dr. Nehemiah Settle HF Cardiology: Dr. Shirlee Latch  77 y.o. with history of chronic diastolic CHF, mitral regurgitation, CVA, and CKD returns for followup of CHF.   Patient was initially admitted with diastolic CHF in 6/17. Echo showed EF 60-65% with grade 3 diastolic dysfunction and moderate MR.  She had cath showing no coronary disease. 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  TEE this admission showed moderate-severe MR.  She was admitted again in 10/18 with acute on chronic diastolic CHF. Echo this admission showed EF 50-55%, severe LVH, restrictive diastolic dysfunction, severe MR, moderate-severe TR.  She was referred to Dr. Cornelius Moras who saw her for mitral valve evaluation.  Upon review of studies at that time, regurgitation thought to be more in the moderate range and likely functional.   Patient had PYP amyloid scan in 12/18, this was suggestive of transthyretin amyloidosis.  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   Admission for A/C diastolic HF 2/6-2/05/2018. Required IV diuresis, then transitioned to 80 mg PO lasix BID. Metop was DC'd due to low blood pressures. Cardiac MRI strongly suggestive of amyloidosis, moderate to severe MR (moderate by regurgitant fraction 41% but severe visually). Echo repeated: EF 40-45%, moderate to severe LVH, severe MR, severe dilation of LA and RA, moderate TR, PASP 49.  She had Mitraclip x 2 placed in 5/19.  Repeat echo post-mitraclip showed EF 55-60%, moderate LVH, mean gradient 3 mmHg across the MV with trace-mild MR.   Cardiomems was placed 6/19.    Echo 6/20 showed EF 55% with moderate LVH, s/p Mitraclip with mean gradient 3 mmHg, mild MR, normal RV, PASP 41 mmHg.   Echo in 11/21 showed EF 50-55%, moderate LVH, normal RV, PASP 44 mmHg, s/p Mitraclip with mean gradient 5 mmHg with mild MR.   Echo 2/23 showed EF 55-60%, mild LVH, mild to moderate MR post Mitraclip.  Echo in 3/24 with EF 55%, mild LVH,  normal RV, PASP 41 mmHg, s/p Mitraclip with mild-moderate MR (mean gradient 3 mmHg).   Patient returns for followup of CHF/amyloidosis.  Main limitation is still joint pain, bilateral hips and lower back.  She walks with a walker due to pain.  No significant dyspnea walking on flat ground.  Hard for her to climb stairs due to her hips, but can make it up a flight of stairs with dyspnea at the top.  No chest pain.  No lightheadedness/syncope. Weight stable.     Cardiomems PADP 16 (target 19).   Labs (10/18): K 4.4, creatinine 1.27, LDL 56, HDL 39 Labs (11/18): K 3.9, creatinine 1.43, SPEP negative Labs (2/19): K 3.8, creatinine 1.40, hgb 13.4 Labs (5/19): K 4.1, creatinine 1.4, hgb 10.8 Labs (6/19): K 4.2, creatinine 1.4, hgb 12.4 Labs (7/19): K 4, creatinine 1.38 Labs (1/20): K 3.6, creatinine 1.36 Labs (3/20): K 3.6, creatinine 1.31, LDL 107 Labs (7/20): creatinine 1.48 Labs (10/20): LDL 87 Labs (12/21): LDL 127, BNP 485 Labs (1/22): K 4.6, creatinine 1.65 Labs (6/22): K 4.1, creatinine 1.67, LDL 142 Labs (11/22): LDL 952 (was not taking Repatha), K 4.2, creatinine 1.64 Labs (2/23): K 4.0, creatinine 1.68, LDL 110, HDL 55 Labs (6/23): K 4, creatinine 1.71, LDL 103 Labs (9/23): K 4.0, creatinine 1.65 Labs (12/23): BNP 133, K 3.8, creatinine 1.71 Labs (3/24): LDL 126, BNP 151, K 4, creatinine 1.86  ECG (personally reviewed): NSR, low voltage  PMH: 1. HTN 2. Hyperlipidemia 3. 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  LINQ monitor placed.  4. Chronic diastolic CHF: She has Cardiomems.  - LHC (6/17): Normal coronaries.  - Echo (10/18): EF 50-55%, severe LVH with speckled myocardium concerning for amyloidosis, restrictive diastolic function with septal flattening, severe MR, moderate-severe TR, severe biatrial enlargement, PASP 54 mmHg.  - PYP amyloid scan: Grade 3, suggestive of transthyretin amyloidosis.  - Echo (09/2017): EF 40-45%, moderate to severe LVH, severe MR, severe dilation of LA  and RA, moderate TR, PASP 49 - Cardiac MRI (02/19): Moderate LVH, EF 45% with diffuse hypokinesis, mildly dilated RV with mildly decreased systolic function, mod to severe MR (moderate by 41% regurgitant fraction, severe visually), severe biatrial enlargement, LGE pattern strongly suggestive of cardiac amyloidosis - Genetic testing showed genetic TTR amyloidosis from Val142Ile.   - Echo (5/19): EF 55-60%, moderate LVH c/w amyloidosis, Mitraclip x 2 with mean MV gradient 3 mmHg, trace-mild MR, moderate TR, PASP 62 mmHg.  - Echo (6/19): EF 55-60%, moderate LVH with speckled myocardium, trivial MR s/p Mitraclip with mean gradient 2 mmHg, PASP 61.  - Echo (6/20): EF 55% with moderate LVH, s/p Mitraclip with mean gradient 3 mmHg, mild MR, normal RV, PASP 41 mmHg.  - Echo (11/21): EF 50-55%, moderate LVH, normal RV, PASP 44 mmHg, s/p Mitraclip with mean gradient 5 mmHg with mild MR. - Echo (2/23): EF 55-60%, mild LVH, mild to moderate MR post Mitraclip. - Echo (3/24):  EF 55%, mild LVH, normal RV, PASP 41 mmHg, s/p Mitraclip with mild-moderate MR (mean gradient 3 mmHg).  5. Mitral regurgitation: Think mixed functional/degenerative - TEE (7/18): EF 45-50%, moderate-severe MR, mild-moderate TR.  - Echo (10/18) read as severe MR.  - 2/19 echo with severe MR.  - 2/19 cardiac MRI with moderate to severe MR.  - Mitraclip x 2 in 5/19.   - Echo 5/19 post-Mitraclip with mean MV gradient 3 mmHg, trace-mild MR.  - Echo 6/20 post-Mitraclip with mean MV gradient 3 mmHg, mild MR.  - Echo 11/21 post-Mitraclip with mean MV gradient 5 mmHg, mild MR - Echo 2/23 EF 55-60%, mild LVH, mild to moderate MR post Mitraclip. - Echo 3/24 s/p Mitraclip with mild-moderate MR and mean gradient 3 mmHg.  6. CKD stage 3 7. Hypothyroidism 8. H/o renal cell CA s/p nephrectomy.  9. Bilateral carpal tunnel releases.  10. ABIs (6/20): Normal 11. OA 12. Dementia: Mild.   Social History   Socioeconomic History   Marital status:  Divorced    Spouse name: Not on file   Number of children: 5   Years of education: 12   Highest education level: Not on file  Occupational History   Occupation: retired    Associate Professor: [employer]  Tobacco Use   Smoking status: Never   Smokeless tobacco: Never  Vaping Use   Vaping status: Never Used  Substance and Sexual Activity   Alcohol use: No   Drug use: No   Sexual activity: Not Currently  Other Topics Concern   Not on file  Social History Narrative   Lives alone in a one story home.  Her son stays with her some.  Has 5 children, 10 grandchildren and 10 great grandchildren.  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  Education: high school.        07/14/18- list food insecurity but is aware of food pantries and gets $15 in food stamps, reports issues with medical bills but has insurance and gets extra help through medicaid for premiums- encouraged pt to call billing to set up reasonable payment  plan so it doesn't go to collections.      Left Handed   Social Determinants of Health   Financial Resource Strain: Low Risk  (09/04/2019)   Overall Financial Resource Strain (CARDIA)    Difficulty of Paying Living Expenses: Not hard at all  Food Insecurity: No Food Insecurity (04/02/2022)   Hunger Vital Sign    Worried About Running Out of Food in the Last Year: Never true    Ran Out of Food in the Last Year: Never true  Transportation Needs: No Transportation Needs (04/02/2022)   PRAPARE - Administrator, Civil Service (Medical): No    Lack of Transportation (Non-Medical): No  Physical Activity: Not on file  Stress: Not on file  Social Connections: Not on file  Intimate Partner Violence: Not At Risk (06/21/2019)   Humiliation, Afraid, Rape, and Kick questionnaire    Fear of Current or Ex-Partner: No    Emotionally Abused: No    Physically Abused: No    Sexually Abused: No   Family History  Problem Relation Age of Onset   Stroke Brother    Diabetes Mellitus II Brother     Diabetes Mellitus II Sister    Breast cancer Sister    Colon cancer Sister    ROS: All systems reviewed and negative except as per HPI.   Current Outpatient Medications  Medication Sig Dispense Refill   acetaminophen (TYLENOL) 500 MG tablet Take 1,000 mg by mouth every 6 (six) hours as needed for headache (pain).     aspirin 81 MG tablet Take 1 tablet (81 mg total) daily by mouth.     butalbital-acetaminophen-caffeine (FIORICET, ESGIC) 50-325-40 MG tablet Take 1 tablet by mouth every 12 (twelve) hours as needed for headache. 14 tablet 0   carvedilol (COREG) 6.25 MG tablet Take 1 tablet (6.25 mg total) by mouth 2 (two) times daily with a meal. 180 tablet 3   clopidogrel (PLAVIX) 75 MG tablet Take 1 tablet (75 mg total) by mouth daily. 30 tablet 1   empagliflozin (JARDIANCE) 10 MG TABS tablet TAKE 1 TABLET(10 MG) BY MOUTH DAILY BEFORE BREAKFAST 90 tablet 1   EPINEPHrine 0.3 mg/0.3 mL IJ SOAJ injection Inject 1 mg into the muscle as needed.     ezetimibe (ZETIA) 10 MG tablet Take 1 tablet (10 mg total) by mouth daily. 90 tablet 3   feeding supplement, ENSURE ENLIVE, (ENSURE ENLIVE) LIQD Take 237 mLs by mouth 2 (two) times daily between meals. 237 mL 0   folic acid (FOLVITE) 1 MG tablet Take 1 tablet (1 mg total) by mouth daily. 100 tablet 3   furosemide (LASIX) 20 MG tablet TAKE 2 TABLETS BY MOUTH IN THE MORNING THEN TAKE 1 TABLET BY MOUTH EVERY EVENING 90 tablet 3   gabapentin (NEURONTIN) 300 MG capsule Take 300 mg by mouth 2 (two) times daily.     Hypromellose (ARTIFICIAL TEARS OP) Place 1 drop into both eyes daily as needed (dry eyes).     levothyroxine (SYNTHROID, LEVOTHROID) 25 MCG tablet Take 25 mcg by mouth daily before breakfast.     memantine (NAMENDA) 10 MG tablet Take 1 tablet (10 mg total) by mouth 2 (two) times daily. 60 tablet 3   oxyCODONE (ROXICODONE) 5 MG immediate release tablet Take 1 tablet (5 mg total) by mouth every 6 (six) hours as needed for breakthrough pain. 10 tablet  0   pantoprazole (PROTONIX) 40 MG tablet TAKE 1 TABLET(40 MG) BY MOUTH DAILY 90 tablet 3  potassium chloride SA (KLOR-CON) 20 MEQ tablet Take 1 tablet (20 mEq total) by mouth daily. 60 tablet 0   REPATHA SURECLICK 140 MG/ML SOAJ ADMINISTER 140 MG(1 ML) UNDER THE SKIN EVERY 14 DAYS 2 mL 11   Tafamidis (VYNDAMAX) 61 MG CAPS TAKE 1 CAPSULE BY MOUTH ONCE A DAY 30 capsule 11   Vitamin A 3 MG (10000 UT) TABS 1 tablet     vutrisiran sodium (AMVUTTRA) 25 MG/0.5ML syringe Inject 0.5 mLs (25 mg total) into the skin every 3 (three) months. 0.5 mL 3   No current facility-administered medications for this encounter.   BP (!) 118/58   Pulse 82   Wt 76.7 kg (169 lb 2 oz)   LMP 02/12/2013   SpO2 99%   BMI 30.93 kg/m   Wt Readings from Last 3 Encounters:  03/14/23 76.7 kg (169 lb 2 oz)  03/07/23 75.8 kg (167 lb 1.7 oz)  01/26/23 75.8 kg (167 lb)  General: NAD Neck: No JVD, goiter present  Lungs: Clear to auscultation bilaterally with normal respiratory effort. CV: Nondisplaced PMI.  Heart regular S1/S2, no S3/S4, no murmur.  No peripheral edema.  No carotid bruit.  Normal pedal pulses.  Abdomen: Soft, nontender, no hepatosplenomegaly, no distention.  Skin: Intact without lesions or rashes.  Neurologic: Alert and oriented x 3.  Psych: Normal affect. Extremities: No clubbing or cyanosis.  HEENT: Normal.    Assessment/Plan: 1. Chronic diastolic CHF: Normal EF on 10/18 echo with severe LVH and speckled myocardium consistent with cardiac amyloidosis.  SPEP was negative. 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 Cardiac MRI also strongly suggestive of cardiac amyloidosis. Echo 3/24 showed EF 55%, mild LVH. NYHA class II, limited more by hip OA than dyspnea.  Not volume overloaded by exam or Cardiomems.  - Continue Coreg 6.25 mg bid. - Continue Lasix 40 qam/20 qpm. BMET/BNP today.  - Continue  Jardiance 10 mg daily.  2. Mitral regurgitation: Mitral regurgitation seems to fluctuate somewhat based on loading conditions.  Most recently, echo in 2/19 suggested severe MR and cardiac MRI suggested moderate to severe MR (moderate by 41% regurgitant fraction but visually severe). Suspect that the etiology is mixed - functional as well as degenerative with thickening and restriction of posterior leaflet. She is now s/p Mitraclip procedure in 5/19. Echo in 11/21 showed mean gradient 5 mmHg across the MV with Mitraclip in place, MR is mild. Echo 3/24 showed mild to moderate MR with mean gradient 3 mmHg, stable. 3. CKD: Stage III.  BMET today.  4. CVA: 7/18. Has LINQ monitor, no atrial fibrillation detected so far.  - Continue ASA 81.   - Continue Plavix. 5. 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  Neuropathic pain has improved with Amvuttra.  - Continue Amvuttra.  6. Cardiac amyloidosis: Hereditary TTR amyloidosis.  She has peripheral neuropathy as well as bilateral carpal tunnel syndrome.   - She is now on tafamidis and Amvuttra, continue.   7. Hyperlipidemia: Goal LDL < 70 with CVA.  Cannot take statins due to rhabdomyolysis with Crestor (interaction with tafamidis). LDL still too high (goal LDL < 55).  - Continue Repatha and Zetia.  - Refer back to lipid clinic, ?add Nexlizet.  8. Osteoarthritis: Severe lifestyle-limiting hip pain, interested in surgery with Dr. Luiz Blare.  Moderate cardiovascular risk, do not think prohibitive.  Can hold Plavix 5 days prior to surgery if she  proceeds with surgery.  9. Goiter: Goiter noted on neck exam.  She has hypothyroidism and is on Levoxyl.   Followup 4 months with APP   Marca Ancona, 03/14/2023

## 2023-03-22 ENCOUNTER — Other Ambulatory Visit (HOSPITAL_COMMUNITY): Payer: Self-pay

## 2023-03-22 ENCOUNTER — Other Ambulatory Visit: Payer: Self-pay

## 2023-03-28 ENCOUNTER — Other Ambulatory Visit (HOSPITAL_COMMUNITY): Payer: Self-pay

## 2023-03-30 ENCOUNTER — Other Ambulatory Visit (HOSPITAL_COMMUNITY): Payer: Self-pay

## 2023-03-31 ENCOUNTER — Other Ambulatory Visit (HOSPITAL_COMMUNITY): Payer: Self-pay

## 2023-03-31 ENCOUNTER — Other Ambulatory Visit: Payer: Self-pay

## 2023-04-01 ENCOUNTER — Other Ambulatory Visit (HOSPITAL_COMMUNITY): Payer: Self-pay

## 2023-04-01 ENCOUNTER — Other Ambulatory Visit: Payer: Self-pay

## 2023-04-07 ENCOUNTER — Ambulatory Visit (HOSPITAL_COMMUNITY)
Admission: RE | Admit: 2023-04-07 | Discharge: 2023-04-07 | Disposition: A | Discharge status: Home / Self Care | Payer: 59 | Source: Ambulatory Visit | Attending: Cardiology | Admitting: Cardiology

## 2023-04-07 DIAGNOSIS — I43 Cardiomyopathy in diseases classified elsewhere: Secondary | ICD-10-CM | POA: Insufficient documentation

## 2023-04-07 DIAGNOSIS — Z8673 Personal history of transient ischemic attack (TIA), and cerebral infarction without residual deficits: Secondary | ICD-10-CM | POA: Diagnosis not present

## 2023-04-07 DIAGNOSIS — E851 Neuropathic heredofamilial amyloidosis: Secondary | ICD-10-CM

## 2023-04-07 DIAGNOSIS — N189 Chronic kidney disease, unspecified: Secondary | ICD-10-CM | POA: Diagnosis not present

## 2023-04-07 DIAGNOSIS — G63 Polyneuropathy in diseases classified elsewhere: Secondary | ICD-10-CM | POA: Diagnosis not present

## 2023-04-07 DIAGNOSIS — G629 Polyneuropathy, unspecified: Secondary | ICD-10-CM | POA: Insufficient documentation

## 2023-04-07 DIAGNOSIS — I5032 Chronic diastolic (congestive) heart failure: Secondary | ICD-10-CM | POA: Insufficient documentation

## 2023-04-07 DIAGNOSIS — E854 Organ-limited amyloidosis: Secondary | ICD-10-CM | POA: Diagnosis not present

## 2023-04-07 DIAGNOSIS — I340 Nonrheumatic mitral (valve) insufficiency: Secondary | ICD-10-CM | POA: Diagnosis not present

## 2023-04-07 DIAGNOSIS — I130 Hypertensive heart and chronic kidney disease with heart failure and stage 1 through stage 4 chronic kidney disease, or unspecified chronic kidney disease: Secondary | ICD-10-CM | POA: Diagnosis not present

## 2023-04-07 DIAGNOSIS — G5603 Carpal tunnel syndrome, bilateral upper limbs: Secondary | ICD-10-CM | POA: Diagnosis not present

## 2023-04-07 MED ORDER — VUTRISIRAN SODIUM 25 MG/0.5ML ~~LOC~~ SOSY
25.0000 mg | PREFILLED_SYRINGE | Freq: Once | SUBCUTANEOUS | Status: AC
  Administered 2023-04-07: 25 mg via SUBCUTANEOUS

## 2023-04-07 NOTE — Progress Notes (Signed)
PCP: Dr. Nehemiah Settle HF Cardiology: Dr. Shirlee Latch  HPI:  77 y.o. with history of chronic diastolic CHF, mitral regurgitation, CVA, TTR amyloid, and CKD .    Patient was initially admitted with diastolic CHF in 01/2016. Echo showed EF 60-65% with grade 3 diastolic dysfunction and moderate MR.  She had cath showing no coronary disease. She was admitted in 01/2017 with left cerebellar CVA and LINQ monitor was placed.  TEE this admission showed moderate-severe MR.  She was admitted again in 05/2017 with acute on chronic diastolic CHF. Echo this admission showed EF 50-55%, severe LVH, restrictive diastolic dysfunction, severe MR, moderate-severe TR.  She was referred to Dr. Cornelius Moras who saw her for mitral valve evaluation.  Upon review of studies at that time, regurgitation thought to be more in the moderate range and likely functional.    Patient had PYP amyloid scan in 07/2017, this was suggestive of transthyretin amyloidosis.  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    Admission for A/C diastolic HF 2/6-2/05/2018. Required IV diuresis, then transitioned to 80 mg PO Lasix BID. Metoprolol was DC'd due to low blood pressures. Cardiac MRI strongly suggestive of amyloidosis, moderate to severe MR (moderate by regurgitant fraction 41% but severe visually). Echo repeated: EF 40-45%, moderate to severe LVH, severe MR, severe dilation of LA and RA, moderate TR, PASP 49.   She had Mitraclip x 2 placed in 11/2017.  Repeat echo post-mitraclip showed EF 55-60%, moderate LVH, mean gradient 3 mmHg across the MV with trace-mild MR.    Cardiomems was placed 12/2017.     Echo 01/2019 showed EF 55% with moderate LVH, s/p Mitraclip with mean gradient 3 mmHg, mild MR, normal RV, PASP 41 mmHg.    Echo in 06/2020 showed EF 50-55%, moderate LVH, normal RV, PASP 44 mmHg, s/p Mitraclip with mean gradient 5 mmHg with mild MR.   Follow up 09/2021, no significant neuropathic symptoms in feet on Amvuttra. Echo 09/2021  showed EF 55-60%, mild LVH, mild to moderate MR post Mitraclip.   Follow up with Dr. Shirlee Latch 03/14/23. Main limitation was still joint pain, bilateral hips and lower back. She walks with a walker due to pain. No significant dyspnea walking on flat ground. Hard for her to climb stairs due to her hips, but can make it up a flight of stairs with dyspnea at the top. No chest pain. No lightheadedness/syncope. Weight was stable.    Today she returns to HF clinic for administration of Amvuttra (vutrisiran). Overall feeling well today. No contraindications to injection today. Injection administered in right arm.    Assessment/Plan: 1. Cardiac amyloidosis: Hereditary TTR amyloidosis.  She has peripheral neuropathy as well as bilateral carpal tunnel syndrome. Normal EF on 05/2017 echo with severe LVH and speckled myocardium consistent with cardiac amyloidosis.  SPEP was negative. 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 Cardiac MRI also strongly suggestive of cardiac amyloidosis.   Echo 09/2021 showed EF 55-60%.  - She is now on tafamidis, no side effects.  Receives from [employer]. - Amvuttra (vutrisiran) injection administered in clinic today. Patient tolerated injection well. Provided patient counseling on Amvuttra. Most common side effects are injection site reactions, arthralgias and dyspnea. Patient is aware to return to clinic every 3 months for repeat injection. Amvuttra will be obtained from [facility] and couriered to clinic for injection.  - Continue vitamin A supplement 10,000 IU daily. Amvuttra decreases serum  vitamin A levels. 2. 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   - Continue Amvuttra as above   Follow up 3 months for repeat Amvuttra injection. Scheduled for 07/11/23 during APP  Clinic visit.    Karle Plumber, PharmD, BCPS, BCCP, CPP Heart Failure Clinic Pharmacist [phone]

## 2023-04-11 ENCOUNTER — Ambulatory Visit: Payer: 59

## 2023-04-11 ENCOUNTER — Other Ambulatory Visit: Payer: Self-pay

## 2023-04-11 ENCOUNTER — Ambulatory Visit: Payer: 59 | Attending: Cardiovascular Disease | Admitting: Pharmacist

## 2023-04-11 DIAGNOSIS — E785 Hyperlipidemia, unspecified: Secondary | ICD-10-CM | POA: Diagnosis not present

## 2023-04-11 MED ORDER — NEXLIZET 180-10 MG PO TABS: 1.0000 | ORAL_TABLET | Freq: Every day | ORAL | 3 refills | Status: AC

## 2023-04-11 MED ORDER — REPATHA SURECLICK 140 MG/ML ~~LOC~~ SOAJ: 140.0000 mg | SUBCUTANEOUS | 3 refills | Status: DC

## 2023-04-11 NOTE — Patient Instructions (Signed)
Your LDL cholesterol is 126 and your goal is < 55  Continue taking your Repatha injections every 2 weeks  Start Nexlizet - 1 tablet daily. This will replace the Zetia you used to take  The heart failure clinic can recheck your labs when you see them in December

## 2023-04-11 NOTE — Progress Notes (Addendum)
Patient ID: Katie [name]                 DOB: [birth year]                    MRN: [id number]     HPI: Katie [name] is a 77 y.o. female patient referred to lipid clinic by Dr Shirlee Latch. PMH is significant for chronic diastolic CHF with EF 55% on 10/2022 echo, transthyretin amyloidosis, CVA in 2018, CKD and renal cell cancer s/p nephrectomy, HTN mitral regurgitation s/p MitraClip, osteoarthritis, and hypothyroidism. She experienced rhabdomyolysis in 2021 on rosuvastatin (interaction with her tafamidis as well) with CK > 29,000. She previously saw lipid clinic and was started on Repatha in 2022.  Pt presents today for follow up. She is taking her Repatha and tolerating well, reviewed injection technique. Reports she has not been taking Zetia for a few years, not sure why she stopped it.  Current Medications: Repatha 140mg  Q2W, ezetimibe 10mg  daily (not taking) Intolerances: rosuvastatin 40mg  daily - rhabdomyolysis Risk Factors: CVA, HF, CKD, HTN, age LDL goal: <55  Exercise: walks at home. Has joint pain in her hips and lower back, ambulates with a walker. Pending hip replacement next month  Family History: Brother with stroke and DM, sister with DM and breast/colon cancer.  Social History: No tobacco, alcohol or drug use.  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 s/p right nephrectomy ~2007.   History of loop recorder    Hypercholesterolemia    Hypertension    Hypothyroidism    Mitral regurgitation    Pulmonary hypertension (HCC)    Stroke (HCC)    no residual    Current Outpatient Medications on File Prior to Visit  Medication  Sig Dispense Refill   acetaminophen (TYLENOL) 500 MG tablet Take 1,000 mg by mouth every 6 (six) hours as needed for headache (pain).     aspirin 81 MG tablet Take 1 tablet (81 mg total) daily by mouth.     butalbital-acetaminophen-caffeine (FIORICET, ESGIC) 50-325-40 MG tablet Take 1 tablet by mouth every 12 (twelve) hours as needed for headache. 14 tablet 0   carvedilol (COREG) 6.25 MG tablet Take 1 tablet (6.25 mg total) by mouth 2 (two) times daily with a meal. 180 tablet 3   clopidogrel (PLAVIX) 75 MG tablet Take 1 tablet (75 mg total) by mouth daily. 30 tablet 1   empagliflozin (JARDIANCE) 10 MG TABS tablet TAKE 1 TABLET(10 MG) BY MOUTH DAILY BEFORE BREAKFAST 90 tablet 1   EPINEPHrine 0.3 mg/0.3 mL IJ SOAJ injection Inject 1 mg into the muscle as needed.     ezetimibe (ZETIA) 10 MG tablet Take 1 tablet (10 mg total) by mouth daily. 90 tablet 3   feeding supplement, ENSURE ENLIVE, (ENSURE ENLIVE) LIQD Take 237 mLs by mouth 2 (two) times daily between meals. 237 mL 0   folic acid (FOLVITE) 1 MG tablet Take 1 tablet (1 mg total) by mouth daily. 100 tablet 3   furosemide (LASIX) 20 MG tablet TAKE  2 TABLETS BY MOUTH IN THE MORNING THEN TAKE 1 TABLET BY MOUTH EVERY EVENING 90 tablet 3   gabapentin (NEURONTIN) 300 MG capsule Take 300 mg by mouth 2 (two) times daily.     Hypromellose (ARTIFICIAL TEARS OP) Place 1 drop into both eyes daily as needed (dry eyes).     levothyroxine (SYNTHROID, LEVOTHROID) 25 MCG tablet Take 25 mcg by mouth daily before breakfast.     memantine (NAMENDA) 10 MG tablet Take 1 tablet (10 mg total) by mouth 2 (two) times daily. 60 tablet 3   oxyCODONE (ROXICODONE) 5 MG immediate release tablet Take 1 tablet (5 mg total) by mouth every 6 (six) hours as needed for breakthrough pain. 10 tablet 0   pantoprazole (PROTONIX) 40 MG tablet TAKE 1 TABLET(40 MG) BY MOUTH DAILY 90 tablet 3   potassium chloride SA (KLOR-CON) 20 MEQ tablet Take 1 tablet (20 mEq total) by mouth daily. 60  tablet 0   REPATHA SURECLICK 140 MG/ML SOAJ ADMINISTER 140 MG(1 ML) UNDER THE SKIN EVERY 14 DAYS 2 mL 11   Tafamidis (VYNDAMAX) 61 MG CAPS TAKE 1 CAPSULE BY MOUTH ONCE A DAY 30 capsule 11   Vitamin A 3 MG (10000 UT) TABS 1 tablet     vutrisiran sodium (AMVUTTRA) 25 MG/0.5ML syringe Inject 0.5 mLs (25 mg total) into the skin every 3 (three) months. 0.5 mL 3   No current facility-administered medications on file prior to visit.    Allergies  Allergen Reactions   Crestor [Rosuvastatin] Other (See Comments)    Drug interaction w/ tafamidis, caused rhabdomyolysis   Tramadol Shortness Of Breath and Palpitations    Assessment/Plan:  1. Hyperlipidemia - LDL 126 on Repatha 140mg  Q2W, above goal < 55. Prior rhabdo on rosuvastatin. Has been off of her Zetia for a few years, not sure why. Will replace with Nexlizet 180-10mg  daily for better LDL lowering, prior auth approved through 10/09/23. She is aware to continue on her Repatha. Has Dual Complete so branded meds are affordable. Will recheck lipids in December at CHF follow up appt. Encouraged heart healthy diet and increase physical activity as able.  Katie [name], PharmD, BCACP, CPP [facility] [address] [place], [place] [address] Phone: [phone]; Fax: [phone] 04/11/2023 3:48 PM

## 2023-04-11 NOTE — Addendum Note (Signed)
Addended by: Marland Reine E on: 04/11/2023 04:55 PM   Modules accepted: Orders

## 2023-04-12 ENCOUNTER — Telehealth: Payer: Self-pay | Admitting: Pharmacy Technician

## 2023-04-12 DIAGNOSIS — M160 Bilateral primary osteoarthritis of hip: Secondary | ICD-10-CM | POA: Diagnosis not present

## 2023-04-12 NOTE — Telephone Encounter (Signed)
Pharmacy Patient Advocate Encounter  Received notification from [facility] that Prior Authorization for nexlizet has been APPROVED from 04/11/23 to 10/09/23   PA #/Case ID/Reference #: [id number]

## 2023-04-17 ENCOUNTER — Emergency Department (HOSPITAL_COMMUNITY)
Admission: EM | Admit: 2023-04-17 | Discharge: 2023-04-17 | Disposition: A | Discharge status: Home / Self Care | Payer: 59 | Attending: Emergency Medicine | Admitting: Emergency Medicine

## 2023-04-17 ENCOUNTER — Other Ambulatory Visit: Payer: Self-pay

## 2023-04-17 ENCOUNTER — Emergency Department (HOSPITAL_COMMUNITY): Payer: 59

## 2023-04-17 ENCOUNTER — Encounter (HOSPITAL_COMMUNITY): Payer: Self-pay | Admitting: Emergency Medicine

## 2023-04-17 ENCOUNTER — Ambulatory Visit (HOSPITAL_COMMUNITY)
Admission: EM | Admit: 2023-04-17 | Discharge: 2023-04-17 | Disposition: A | Discharge status: Home / Self Care | Payer: 59

## 2023-04-17 DIAGNOSIS — Z7902 Long term (current) use of antithrombotics/antiplatelets: Secondary | ICD-10-CM | POA: Insufficient documentation

## 2023-04-17 DIAGNOSIS — M546 Pain in thoracic spine: Secondary | ICD-10-CM | POA: Diagnosis not present

## 2023-04-17 DIAGNOSIS — M545 Low back pain, unspecified: Secondary | ICD-10-CM | POA: Insufficient documentation

## 2023-04-17 DIAGNOSIS — Z7982 Long term (current) use of aspirin: Secondary | ICD-10-CM | POA: Insufficient documentation

## 2023-04-17 DIAGNOSIS — R519 Headache, unspecified: Secondary | ICD-10-CM

## 2023-04-17 DIAGNOSIS — G319 Degenerative disease of nervous system, unspecified: Secondary | ICD-10-CM | POA: Diagnosis not present

## 2023-04-17 DIAGNOSIS — S3992XA Unspecified injury of lower back, initial encounter: Secondary | ICD-10-CM | POA: Diagnosis not present

## 2023-04-17 DIAGNOSIS — M25552 Pain in left hip: Secondary | ICD-10-CM | POA: Diagnosis not present

## 2023-04-17 DIAGNOSIS — S0990XA Unspecified injury of head, initial encounter: Secondary | ICD-10-CM | POA: Diagnosis not present

## 2023-04-17 DIAGNOSIS — S199XXA Unspecified injury of neck, initial encounter: Secondary | ICD-10-CM | POA: Diagnosis not present

## 2023-04-17 DIAGNOSIS — W19XXXA Unspecified fall, initial encounter: Secondary | ICD-10-CM

## 2023-04-17 DIAGNOSIS — Z7901 Long term (current) use of anticoagulants: Secondary | ICD-10-CM

## 2023-04-17 MED ORDER — ACETAMINOPHEN 500 MG PO TABS
1000.0000 mg | ORAL_TABLET | Freq: Once | ORAL | Status: AC
  Administered 2023-04-17: 1000 mg via ORAL
  Filled 2023-04-17: qty 2

## 2023-04-17 NOTE — Discharge Instructions (Addendum)
Return for any problem.  CT imaging obtained today does not show acute problems such as bleeding in your brain or broken bones.  Use Tylenol for treatment of pain.  Return for any problem.

## 2023-04-17 NOTE — ED Provider Notes (Signed)
[place] EMERGENCY DEPARTMENT AT [facility] Provider Note   CSN: [id number] Arrival date & time: 04/17/23  1156     History  Chief Complaint  Patient presents with   Marletta Lor    Katie [name] is a 77 y.o. female.  77 year old female with prior medical history as detailed below presents for evaluation.  Patient reports that she fell backwards 2 days ago.  She did strike the back of her head.  She complains of diffuse pain to her upper back and low back.  Patient seen at urgent care today and referred to the ED for imaging.  Patient denies LOC.  Patient reports that she has been ambulating after the fall.  She has been using some Tylenol with good effect.    The history is provided by the patient and medical records.       Home Medications Prior to Admission medications   Medication Sig Start Date End Date Taking? Authorizing Provider  acetaminophen (TYLENOL) 500 MG tablet Take 1,000 mg by mouth every 6 (six) hours as needed for headache (pain).    [provider]  aspirin 81 MG tablet Take 1 tablet (81 mg total) daily by mouth. 06/14/17   Laurey Morale, MD  Bempedoic Acid-Ezetimibe (NEXLIZET) 180-10 MG TABS Take 1 tablet by mouth daily. 04/11/23   Laurey Morale, MD  butalbital-acetaminophen-caffeine (FIORICET, ESGIC) [phone] MG tablet Take 1 tablet by mouth every 12 (twelve) hours as needed for headache. 02/17/17   Joseph Art, DO  carvedilol (COREG) 6.25 MG tablet Take 1 tablet (6.25 mg total) by mouth 2 (two) times daily with a meal. 07/07/22   [place], Anderson Malta, FNP  clopidogrel (PLAVIX) 75 MG tablet Take 1 tablet (75 mg total) by mouth daily. 02/13/17   Lahoma Crocker, MD  empagliflozin (JARDIANCE) 10 MG TABS tablet TAKE 1 TABLET(10 MG) BY MOUTH DAILY BEFORE BREAKFAST 02/14/23   Laurey Morale, MD  EPINEPHrine 0.3 mg/0.3 mL IJ SOAJ injection Inject 1 mg into the muscle as needed. 03/12/21   [provider]  Evolocumab (REPATHA SURECLICK)  140 MG/ML SOAJ Inject 140 mg into the skin every 14 (fourteen) days. 04/11/23   Laurey Morale, MD  feeding supplement, ENSURE ENLIVE, (ENSURE ENLIVE) LIQD Take 237 mLs by mouth 2 (two) times daily between meals. 02/17/17   Joseph Art, DO  folic acid (FOLVITE) 1 MG tablet Take 1 tablet (1 mg total) by mouth daily. 01/28/23 01/28/24  Micki Riley, MD  furosemide (LASIX) 20 MG tablet TAKE 2 TABLETS BY MOUTH IN THE MORNING THEN TAKE 1 TABLET BY MOUTH EVERY EVENING 01/04/23   Laurey Morale, MD  gabapentin (NEURONTIN) 300 MG capsule Take 300 mg by mouth 2 (two) times daily.    [provider]  Hypromellose (ARTIFICIAL TEARS OP) Place 1 drop into both eyes daily as needed (dry eyes).    [provider]  levothyroxine (SYNTHROID, LEVOTHROID) 25 MCG tablet Take 25 mcg by mouth daily before breakfast.    [provider]  memantine (NAMENDA) 10 MG tablet Take 1 tablet (10 mg total) by mouth 2 (two) times daily. 01/26/23   Micki Riley, MD  oxyCODONE (ROXICODONE) 5 MG immediate release tablet Take 1 tablet (5 mg total) by mouth every 6 (six) hours as needed for breakthrough pain. 03/07/23   Kommor, [place], MD  pantoprazole (PROTONIX) 40 MG tablet TAKE 1 TABLET(40 MG) BY MOUTH DAILY 03/09/23   Laurey Morale, MD  potassium chloride SA (KLOR-CON) 20 MEQ tablet Take 1 tablet (20 mEq total) by mouth daily. 06/16/20   Zannie Cove, MD  Tafamidis [facility]) 61 MG CAPS TAKE 1 CAPSULE BY MOUTH ONCE A DAY 05/25/22 05/25/23  Laurey Morale, MD  Vitamin A 3 MG (10000 UT) TABS 1 tablet    [provider]  vutrisiran sodium (AMVUTTRA) 25 MG/0.5ML syringe Inject 0.5 mLs (25 mg total) into the skin every 3 (three) months. 06/28/22   Laurey Morale, MD      Allergies    Crestor [rosuvastatin] and Tramadol    Review of Systems   Review of Systems  All other systems reviewed and are negative.   Physical Exam Updated Vital Signs BP 103/68   Pulse 69   Temp 98.4 F  (36.9 C)   Resp 18   LMP 02/12/2013   SpO2 98%  Physical Exam Vitals and nursing note reviewed.  Constitutional:      General: She is not in acute distress.    Appearance: Normal appearance. She is well-developed.  HENT:     Head: Normocephalic and atraumatic.  Eyes:     Conjunctiva/sclera: Conjunctivae normal.     Pupils: Pupils are equal, round, and reactive to light.  Cardiovascular:     Rate and Rhythm: Normal rate and regular rhythm.     Heart sounds: Normal heart sounds.  Pulmonary:     Effort: Pulmonary effort is normal. No respiratory distress.     Breath sounds: Normal breath sounds.  Abdominal:     General: There is no distension.     Palpations: Abdomen is soft.     Tenderness: There is no abdominal tenderness.  Musculoskeletal:        General: No deformity. Normal range of motion.     Cervical back: Normal range of motion and neck supple.     Comments: Mild diffuse tenderness with palpation across the upper back and diffusely across the lumbar back.  No appreciable significant midline tenderness.  Skin:    General: Skin is warm and dry.  Neurological:     General: No focal deficit present.     Mental Status: She is alert and oriented to person, place, and time.     ED Results / Procedures / Treatments   Labs (all labs ordered are listed, but only abnormal results are displayed) Labs Reviewed - No data to display  EKG None  Radiology No results found.  Procedures Procedures    Medications Ordered in ED Medications - No data to display  ED Course/ Medical Decision Making/ A&P                                 Medical Decision Making Amount and/or Complexity of Data Reviewed Radiology: ordered.  Risk OTC drugs.    Medical Screen Complete  This patient presented to the ED with complaint of fall, head injury.  This complaint involves an extensive number of treatment options. The initial differential diagnosis includes, but is not limited  to, trauma related to fall  This presentation is: Acute, Self-Limited, Previously Undiagnosed, Uncertain Prognosis, Complicated, Systemic Symptoms, and Threat to Life/Bodily Function  Patient with reported mechanical fall that occurred 2 days ago.  She did strike her head.  Patient was sent from urgent care for imaging given apparent head injury that occurred 2 days ago.  Patient with minimal complaint on evaluation.  CT imaging does not  demonstrate significant acute pathology.  Patient is reassured by findings.  She is comfortable with plan for discharge home.  Strict return precautions given and understood.  Additional history obtained: External records from outside sources obtained and reviewed including prior ED visits and prior Inpatient records.   Imaging Studies ordered:  I ordered imaging studies including CT head, CT C-spine, CT lumbar spine I independently visualized and interpreted obtained imaging which showed NAD I agree with the radiologist interpretation.  Problem List / ED Course:  Fall, head injury   Reevaluation:  After the interventions noted above, I reevaluated the patient and found that they have: improved   Disposition:  After consideration of the diagnostic results and the patients response to treatment, I feel that the patent would benefit from close outpatient follow-up.          Final Clinical Impression(s) / ED Diagnoses Final diagnoses:  Fall, initial encounter    Rx / DC Orders ED Discharge Orders     None         Wynetta Fines, MD 04/17/23 1550

## 2023-04-17 NOTE — ED Triage Notes (Addendum)
Pt reports she fell backwards down 3 stairs yesterday. Pt reports pain in the back of her head a lower back. Denies neck pain. Denies any extremity weakness or numbness. Pt taking Plavix.

## 2023-04-17 NOTE — Discharge Instructions (Signed)
Please go to the nearest ER for further workup.

## 2023-04-17 NOTE — ED Provider Notes (Signed)
Katie [name] is a 77 y.o. female presenting for chief complaint of fall down the steps 2 days ago.  She was attempting to walk down her 4 front concrete steps with her cane when she accidentally lost her footing falling backwards and hitting the back of her head.  She also complains of a couple of abrasions to the right fourth and fifth distal fingers.  She did not lose consciousness, did not become nauseous or vomit after falling, and did not immediately experience any pain to the head.  No palpable hematoma or bleeding from scalp. She takes Plavix blood thinner and has not missed any doses.  She is ambulatory to baseline with walker.  Alert and oriented to baseline.  No vision changes, paresthesias, speech changes, or extremity weakness.  Recommend transport to the nearest emergency department via POV with family friend for further workup and evaluation given we are unable to perform CT imaging of the head in the urgent care setting to rule out bleed after head injury on blood thinners.  Discussed recommendations and plan with patient and family member who expressed understanding and agreement with plan.  Vital signs stable, patient discharged from urgent care in stable condition.   Carlisle Beers, [place] 04/17/23 1158

## 2023-04-17 NOTE — ED Notes (Signed)
Patient is being discharged from the Urgent Care and sent to the Emergency Department via private vehicle with family member . Per Wardell Honour, NP, patient is in need of higher level of care due to fall, [facility], on anticoagulants. Patient is aware and verbalizes understanding of plan of care.  Vitals:   04/17/23 1148  BP: 122/66  Pulse: 80  Resp: 18  SpO2: 98%

## 2023-04-17 NOTE — ED Triage Notes (Signed)
Pt presents with family member. Reports stumbling down a couple steps 2 days ago, hitting back of head on ground. Denies LOC, fall was witnessed. Pt is currently on Plavix. C/O HA, denies vision changes. Pt alert and oriented. Denies any neck tenderness. Pt ambulatory with walker.

## 2023-04-19 DIAGNOSIS — Z23 Encounter for immunization: Secondary | ICD-10-CM | POA: Diagnosis not present

## 2023-04-19 DIAGNOSIS — Z9181 History of falling: Secondary | ICD-10-CM | POA: Diagnosis not present

## 2023-04-19 DIAGNOSIS — R519 Headache, unspecified: Secondary | ICD-10-CM | POA: Diagnosis not present

## 2023-04-23 ENCOUNTER — Encounter (HOSPITAL_COMMUNITY): Payer: Self-pay

## 2023-04-25 ENCOUNTER — Other Ambulatory Visit: Payer: Self-pay | Admitting: Orthopedic Surgery

## 2023-05-02 ENCOUNTER — Other Ambulatory Visit: Payer: Self-pay

## 2023-05-02 NOTE — Progress Notes (Signed)
Specialty Pharmacy Refill Coordination Note  Katie [name] is a 77 y.o. female contacted today regarding refills of specialty medication(s) Tafamidis .  Patient requested Delivery  on 05/06/23  to verified address [address], [place], [address]   Medication will be filled on 05/05/2023.

## 2023-05-04 NOTE — Progress Notes (Addendum)
COVID Vaccine Completed: yes  Date of COVID positive in last 90 days:  PCP - Renford Dills, MD Cardiologist - Marca Ancona, MD  Chest x-ray -  EKG - 03/14/23 Epic Stress Test -  ECHO - 10/26/22 Epic Cardiac Cath - 01/12/16 Epic Pacemaker/ICD device last checked: loop recorder 06/02/20 Epic Spinal Cord Stimulator:  Bowel Prep -   Sleep Study -  CPAP -   Fasting Blood Sugar -  Checks Blood Sugar _____ times a day  Last dose of GLP1 agonist-  N/A GLP1 instructions:  N/A   Last dose of SGLT-2 inhibitors-  N/A SGLT-2 instructions: N/A   Blood Thinner Instructions:  Plavix Aspirin Instructions: ASA 81, hold 5-7 days Last Dose:  Activity level:  Can go up a flight of stairs and perform activities of daily living without stopping and without symptoms of chest pain or shortness of breath.  Able to exercise without symptoms  Unable to go up a flight of stairs without symptoms of     Anesthesia review: mitral regurgitation, HTN, CHF, CKD, stroke, loop recorder  Patient denies shortness of breath, fever, cough and chest pain at PAT appointment  Patient verbalized understanding of instructions that were given to them at the PAT appointment. Patient was also instructed that they will need to review over the PAT instructions again at home before surgery.

## 2023-05-04 NOTE — Patient Instructions (Addendum)
SURGICAL WAITING ROOM VISITATION  Patients having surgery or a procedure may have no more than 2 support people in the waiting area - these visitors may rotate.    Children under the age of 21 must have an adult with them who is not the patient.  Due to an increase in RSV and influenza rates and associated hospitalizations, children ages 41 and under may not visit patients in [facility] hospitals.  If the patient needs to stay at the hospital during part of their recovery, the visitor guidelines for inpatient rooms apply. Pre-op nurse will coordinate an appropriate time for 1 support person to accompany patient in pre-op.  This support person may not rotate.    Please refer to the [facility] website for the visitor guidelines for Inpatients (after your surgery is over and you are in a regular room).    Your procedure is scheduled on: 05/16/23   Report to [facility] Main Entrance    Report to admitting at 5:15 AM   Call this number if you have problems the morning of surgery [phone]   Do not eat food :After Midnight.   After Midnight you may have the following liquids until 4:30 AM DAY OF SURGERY  Water Non-Citrus Juices (without pulp, NO RED-Apple, White grape, White cranberry) Black Coffee (NO MILK/CREAM OR CREAMERS, sugar ok)  Clear Tea (NO MILK/CREAM OR CREAMERS, sugar ok) regular and decaf                             Plain Jell-O (NO RED)                                           Fruit ices (not with fruit pulp, NO RED)                                     Popsicles (NO RED)                                                               Sports drinks like Gatorade (NO RED)               The day of surgery:  Drink ONE (1) Pre-Surgery Clear Ensure at 4:30 AM the morning of surgery. Drink in one sitting. Do not sip.  This drink was given to you during your hospital  pre-op appointment visit. Nothing else to drink after completing the  Pre-Surgery Clear Ensure.           If you have questions, please contact your surgeon's office.   FOLLOW BOWEL PREP AND ANY ADDITIONAL PRE OP INSTRUCTIONS YOU RECEIVED FROM YOUR SURGEON'S OFFICE!!!     Oral Hygiene is also important to reduce your risk of infection.                                    Remember - BRUSH YOUR TEETH THE MORNING OF SURGERY WITH YOUR REGULAR TOOTHPASTE  DENTURES WILL BE REMOVED  PRIOR TO SURGERY PLEASE DO NOT APPLY "Poly grip" OR ADHESIVES!!!   Stop all vitamins and herbal supplements 7 days before surgery.   Take these medicines the morning of surgery with A SIP OF WATER: Tylenol, Carvedilol, Gabapentin, Levothyroxine, Pantorpazole, Tafamidis  These are anesthesia recommendations for holding your anticoagulants.  Please contact your prescribing physician to confirm IF it is safe to hold your anticoagulants for this length of time.   Eliquis Apixaban   72 hours   Xarelto Rivaroxaban   72 hours  Plavix Clopidogrel   120 hours  Pletal Cilostazol   120 hours                                You may not have any metal on your body including hair pins, jewelry, and body piercing             Do not wear make-up, lotions, powders, perfumes, or deodorant  Do not wear nail polish including gel and S&S, artificial/acrylic nails, or any other type of covering on natural nails including finger and toenails. If you have artificial nails, gel coating, etc. that needs to be removed by a nail salon please have this removed prior to surgery or surgery may need to be canceled/ delayed if the surgeon/ anesthesia feels like they are unable to be safely monitored.   Do not shave  48 hours prior to surgery.    Do not bring valuables to the hospital.  IS NOT             RESPONSIBLE   FOR VALUABLES.   Contacts, glasses, dentures or bridgework may not be worn into surgery.   Bring small overnight bag day of surgery.   DO NOT BRING YOUR HOME MEDICATIONS TO THE HOSPITAL. PHARMACY WILL DISPENSE  MEDICATIONS LISTED ON YOUR MEDICATION LIST TO YOU DURING YOUR ADMISSION IN THE HOSPITAL!              Please read over the following fact sheets you were given: IF YOU HAVE QUESTIONS ABOUT YOUR PRE-OP INSTRUCTIONS PLEASE CALL [name]   If you received a COVID test during your pre-op visit  it is requested that you wear a mask when out in public, stay away from anyone that may not be feeling well and notify your surgeon if you develop symptoms. If you test positive for Covid or have been in contact with anyone that has tested positive in the last 10 days please notify you surgeon.      Pre-operative 5 CHG Bath Instructions   You can play a key role in reducing the risk of infection after surgery. Your skin needs to be as free of germs as possible. You can reduce the number of germs on your skin by washing with CHG (chlorhexidine gluconate) soap before surgery. CHG is an antiseptic soap that kills germs and continues to kill germs even after washing.   DO NOT use if you have an allergy to chlorhexidine/CHG or antibacterial soaps. If your skin becomes reddened or irritated, stop using the CHG and notify one of our RNs at [phone].   Please shower with the CHG soap starting 4 days before surgery using the following schedule:     Please keep in mind the following:  DO NOT shave, including legs and underarms, starting the day of your first shower.   You may shave your face at any point before/day of surgery.  Place clean sheets on your bed the day you start using CHG soap. Use a clean washcloth (not used since being washed) for each shower. DO NOT sleep with pets once you start using the CHG.   CHG Shower Instructions:  If you choose to wash your hair and private area, wash first with your normal shampoo/soap.  After you use shampoo/soap, rinse your hair and body thoroughly to remove shampoo/soap residue.  Turn the water OFF and apply about 3 tablespoons (45 ml) of CHG soap to  a CLEAN washcloth.  Apply CHG soap ONLY FROM YOUR NECK DOWN TO YOUR TOES (washing for 3-5 minutes)  DO NOT use CHG soap on face, private areas, open wounds, or sores.  Pay special attention to the area where your surgery is being performed.  If you are having back surgery, having someone wash your back for you may be helpful. Wait 2 minutes after CHG soap is applied, then you may rinse off the CHG soap.  Pat dry with a clean towel  Put on clean clothes/pajamas   If you choose to wear lotion, please use ONLY the CHG-compatible lotions on the back of this paper.     Additional instructions for the day of surgery: DO NOT APPLY any lotions, deodorants, cologne, or perfumes.   Put on clean/comfortable clothes.  Brush your teeth.  Ask your nurse before applying any prescription medications to the skin.      CHG Compatible Lotions   Aveeno Moisturizing lotion  Cetaphil Moisturizing Cream  Cetaphil Moisturizing Lotion  Clairol Herbal Essence Moisturizing Lotion, Dry Skin  Clairol Herbal Essence Moisturizing Lotion, Extra Dry Skin  Clairol Herbal Essence Moisturizing Lotion, Normal Skin  Curel Age Defying Therapeutic Moisturizing Lotion with Alpha Hydroxy  Curel Extreme Care Body Lotion  Curel Soothing Hands Moisturizing Hand Lotion  Curel Therapeutic Moisturizing Cream, Fragrance-Free  Curel Therapeutic Moisturizing Lotion, Fragrance-Free  Curel Therapeutic Moisturizing Lotion, Original Formula  Eucerin Daily Replenishing Lotion  Eucerin Dry Skin Therapy Plus Alpha Hydroxy Crme  Eucerin Dry Skin Therapy Plus Alpha Hydroxy Lotion  Eucerin Original Crme  Eucerin Original Lotion  Eucerin Plus Crme Eucerin Plus Lotion  Eucerin TriLipid Replenishing Lotion  Keri Anti-Bacterial Hand Lotion  Keri Deep Conditioning Original Lotion Dry Skin Formula Softly Scented  Keri Deep Conditioning Original Lotion, Fragrance Free Sensitive Skin Formula  Keri Lotion Fast Absorbing Fragrance Free  Sensitive Skin Formula  Keri Lotion Fast Absorbing Softly Scented Dry Skin Formula  Keri Original Lotion  Keri Skin Renewal Lotion Keri Silky Smooth Lotion  Keri Silky Smooth Sensitive Skin Lotion  Nivea Body Creamy Conditioning Oil  Nivea Body Extra Enriched Teacher, adult education Moisturizing Lotion Nivea Crme  Nivea Skin Firming Lotion  NutraDerm 30 Skin Lotion  NutraDerm Skin Lotion  NutraDerm Therapeutic Skin Cream  NutraDerm Therapeutic Skin Lotion  ProShield Protective Hand Cream  Provon moisturizing lotion  WHAT IS A BLOOD TRANSFUSION? Blood Transfusion Information  A transfusion is the replacement of blood or some of its parts. Blood is made up of multiple cells which provide different functions. Red blood cells carry oxygen and are used for blood loss replacement. White blood cells fight against infection. Platelets control bleeding. Plasma helps clot blood. Other blood products are available for specialized needs, such as hemophilia or other clotting disorders. BEFORE THE TRANSFUSION  Who gives blood for transfusions?  Healthy volunteers who are fully evaluated to make sure their blood is safe. This is blood bank  blood. Transfusion therapy is the safest it has ever been in the practice of medicine. Before blood is taken from a donor, a complete history is taken to make sure that person has no history of diseases nor engages in risky social behavior (examples are intravenous drug use or sexual activity with multiple partners). The donor's travel history is screened to minimize risk of transmitting infections, such as malaria. The donated blood is tested for signs of infectious diseases, such as HIV and hepatitis. The blood is then tested to be sure it is compatible with you in order to minimize the chance of a transfusion reaction. If you or a relative donates blood, this is often done in anticipation of surgery and is not appropriate for emergency  situations. It takes many days to process the donated blood. RISKS AND COMPLICATIONS Although transfusion therapy is very safe and saves many lives, the main dangers of transfusion include:  Getting an infectious disease. Developing a transfusion reaction. This is an allergic reaction to something in the blood you were given. Every precaution is taken to prevent this. The decision to have a blood transfusion has been considered carefully by your caregiver before blood is given. Blood is not given unless the benefits outweigh the risks. AFTER THE TRANSFUSION Right after receiving a blood transfusion, you will usually feel much better and more energetic. This is especially true if your red blood cells have gotten low (anemic). The transfusion raises the level of the red blood cells which carry oxygen, and this usually causes an energy increase. The nurse administering the transfusion will monitor you carefully for complications. HOME CARE INSTRUCTIONS  No special instructions are needed after a transfusion. You may find your energy is better. Speak with your caregiver about any limitations on activity for underlying diseases you may have. SEEK MEDICAL CARE IF:  Your condition is not improving after your transfusion. You develop redness or irritation at the intravenous (IV) site. SEEK IMMEDIATE MEDICAL CARE IF:  Any of the following symptoms occur over the next 12 hours: Shaking chills. You have a temperature by mouth above 102 F (38.9 C), not controlled by medicine. Chest, back, or muscle pain. People around you feel you are not acting correctly or are confused. Shortness of breath or difficulty breathing. Dizziness and fainting. You get a rash or develop hives. You have a decrease in urine output. Your urine turns a dark color or changes to pink, red, or brown. Any of the following symptoms occur over the next 10 days: You have a temperature by mouth above 102 F (38.9 C), not controlled  by medicine. Shortness of breath. Weakness after normal activity. The white part of the eye turns yellow (jaundice). You have a decrease in the amount of urine or are urinating less often. Your urine turns a dark color or changes to pink, red, or brown. Document Released: 07/16/2000 Document Revised: 10/11/2011 Document Reviewed: 03/04/2008 Exit[employer] Patient Information 2014 [place], [place].  _______________________________________________________________________  Incentive Spirometer  An incentive spirometer is a tool that can help keep your lungs clear and active. This tool measures how well you are filling your lungs with each breath. Taking long deep breaths may help reverse or decrease the chance of developing breathing (pulmonary) problems (especially infection) following: A long period of time when you are unable to move or be active. BEFORE THE PROCEDURE  If the spirometer includes an indicator to show your best effort, your nurse or respiratory therapist will set it to a desired goal.  If possible, sit up straight or lean slightly forward. Try not to slouch. Hold the incentive spirometer in an upright position. INSTRUCTIONS FOR USE  Sit on the edge of your bed if possible, or sit up as far as you can in bed or on a chair. Hold the incentive spirometer in an upright position. Breathe out normally. Place the mouthpiece in your mouth and seal your lips tightly around it. Breathe in slowly and as deeply as possible, raising the piston or the ball toward the top of the column. Hold your breath for 3-5 seconds or for as long as possible. Allow the piston or ball to fall to the bottom of the column. Remove the mouthpiece from your mouth and breathe out normally. Rest for a few seconds and repeat Steps 1 through 7 at least 10 times every 1-2 hours when you are awake. Take your time and take a few normal breaths between deep breaths. The spirometer may include an indicator to show your best  effort. Use the indicator as a goal to work toward during each repetition. After each set of 10 deep breaths, practice coughing to be sure your lungs are clear. If you have an incision (the cut made at the time of surgery), support your incision when coughing by placing a pillow or rolled up towels firmly against it. Once you are able to get out of bed, walk around indoors and cough well. You may stop using the incentive spirometer when instructed by your caregiver.  RISKS AND COMPLICATIONS Take your time so you do not get dizzy or light-headed. If you are in pain, you may need to take or ask for pain medication before doing incentive spirometry. It is harder to take a deep breath if you are having pain. AFTER USE Rest and breathe slowly and easily. It can be helpful to keep track of a log of your progress. Your caregiver can provide you with a simple table to help with this. If you are using the spirometer at home, follow these instructions: SEEK MEDICAL CARE IF:  You are having difficultly using the spirometer. You have trouble using the spirometer as often as instructed. Your pain medication is not giving enough relief while using the spirometer. You develop fever of 100.5 F (38.1 C) or higher. SEEK IMMEDIATE MEDICAL CARE IF:  You cough up bloody sputum that had not been present before. You develop fever of 102 F (38.9 C) or greater. You develop worsening pain at or near the incision site. MAKE SURE YOU:  Understand these instructions. Will watch your condition. Will get help right away if you are not doing well or get worse. Document Released: 11/29/2006 Document Revised: 10/11/2011 Document Reviewed: 01/30/2007 [facility] Patient Information 2014 [place], [place].   ________________________________________________________________________

## 2023-05-05 ENCOUNTER — Encounter (HOSPITAL_COMMUNITY)
Admission: RE | Admit: 2023-05-05 | Discharge: 2023-05-05 | Disposition: A | Discharge status: Home / Self Care | Payer: 59 | Source: Ambulatory Visit | Attending: Orthopedic Surgery | Admitting: Orthopedic Surgery

## 2023-05-05 ENCOUNTER — Encounter (HOSPITAL_COMMUNITY): Payer: Self-pay

## 2023-05-05 ENCOUNTER — Other Ambulatory Visit: Payer: Self-pay

## 2023-05-05 VITALS — Ht 66.0 in

## 2023-05-05 DIAGNOSIS — R262 Difficulty in walking, not elsewhere classified: Secondary | ICD-10-CM | POA: Diagnosis not present

## 2023-05-05 DIAGNOSIS — M1612 Unilateral primary osteoarthritis, left hip: Secondary | ICD-10-CM | POA: Diagnosis not present

## 2023-05-05 DIAGNOSIS — Z01818 Encounter for other preprocedural examination: Secondary | ICD-10-CM

## 2023-05-05 DIAGNOSIS — I5020 Unspecified systolic (congestive) heart failure: Secondary | ICD-10-CM

## 2023-05-05 DIAGNOSIS — M25652 Stiffness of left hip, not elsewhere classified: Secondary | ICD-10-CM | POA: Diagnosis not present

## 2023-05-05 HISTORY — DX: Unspecified osteoarthritis, unspecified site: M19.90

## 2023-05-05 NOTE — Progress Notes (Addendum)
Called patient at 1010 regarding PST appointment. Patient stated she had an appointment with her surgeon today at 1000 and she was still waiting on her daughter to pick her up. Asked patient if she would be able to come get her labs done after her appointment with her surgeon and I could call her later today to do a phone interview. Patient stated she would need to speak with her daughter as she works and may not have time. Gave patient my phone number to call back once she spoke with her daughter.   Have not received call back from patient. Have attempted to call twice with no answer. Voicemail box is full. Will continue to try to reach patient.  05/05/23 @ 1500- Completed interview questions with patient over the phone. She needed to get off the phone as someone was picking her up. Will call her back tomorrow morning to go over instructions.  Addendum: 05/06/23- went over instructions with patient today over the phone. She does not have an email so she wrote them down. Asked if she could come in some time next week to get labs, soap, and drink. Patient stated she does not drive and her daughter works 5 days a week. Reports no other ride options. Patient said she will speak with her daughter to see if she has any time next week to bring her in.

## 2023-05-05 NOTE — Care Plan (Signed)
Ortho Bundle Case Management Note  Patient Details  Name: ANNALICIA RENFREW MRN: [id number] Date of Birth: 30-Jul-1946 Patient will discharge to home with family to assist. Has rolling walker. OPPT set up with [facility] Discharge instructions mailed and discussed. Patient and MD in agreement with plan. Choice offered                     DME Arranged:    DME Agency:     HH Arranged:    HH Agency:     Additional Comments: Please contact me with any questions of if this plan should need to change.  Shauna Hugh,  RN,BSN,MHA,CCM  [facility]  [phone] 05/05/2023, 11:18 AM

## 2023-05-09 ENCOUNTER — Ambulatory Visit: Payer: 59 | Admitting: Neurology

## 2023-05-09 NOTE — Progress Notes (Signed)
Anesthesia Chart Review   Case: [id number] Date/Time: 05/16/23 0715   Procedure: TOTAL HIP ARTHROPLASTY ANTERIOR APPROACH (Left: Hip)   Anesthesia type: Spinal   Pre-op diagnosis: left hip osteoarthritis   Location: [facility] ROOM 06 / [facility]   Surgeons: Jodi Geralds, MD       DISCUSSION:77 y.o. never smoker with h/o HTN, CHF, CKD stage III, kidney cancer s/p right nephrectomy, left hip OA scheduled for above procedure 05/16/2023 with Dr. Jodi Geralds.   Pt seen by cardiology 03/14/2023. Per OV note, "Severe lifestyle-limiting hip pain, interested in surgery with Dr. Luiz Blare. Moderate cardiovascular risk, do not think prohibitive. Can hold Plavix 5 days prior to surgery if she proceeds with surgery. " VS: Ht 5\' 6"  (1.676 m)   LMP 02/12/2013   BMI 27.30 kg/m   PROVIDERS: Renford Dills, MD is PCP   Cardiologist - Marca Ancona, MD  LABS: Labs reviewed: Acceptable for surgery. (all labs ordered are listed, but only abnormal results are displayed)  Labs Reviewed - No data to display   IMAGES:   EKG:   CV: Echo 10/26/2022 1. Left ventricular ejection fraction, by estimation, is 55%. The left  ventricle has normal function. The left ventricle has no regional wall  motion abnormalities. There is mild concentric left ventricular  hypertrophy. Left ventricular diastolic  parameters are indeterminate.   2. Right ventricular systolic function is normal. The right ventricular  size is normal. There is mildly elevated pulmonary artery systolic  pressure. The estimated right ventricular systolic pressure is 40.9 mmHg.   3. Left atrial size was severely dilated.   4. The mitral valve has been repaired/replaced.There is a Mitra-Clip  present in the mitral position. The clip appears well positioned. There is  mild to moderate residual mitral regurgitation. There is no mitral  stenosis. Mean gradient at HR 73bpm  which is stable from prior TTE.   5. The aortic valve is tricuspid. Aortic  valve regurgitation is not  visualized. No aortic stenosis is present.   6. The inferior vena cava is normal in size with greater than 50%  respiratory variability, suggesting right atrial pressure of 3 mmHg.   Comparison(s): Prior TTE 09/2021 with mean MV gradient at HR 74bpm  which is stable.  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 s/p right nephrectomy ~2007.   History of loop recorder    Hypercholesterolemia    Hypertension    Hypothyroidism    Mitral regurgitation    Pulmonary hypertension (HCC)    Stroke (HCC)    no residual    Past Surgical History:  Procedure Laterality Date   BREAST EXCISIONAL BIOPSY Left    BREAST EXCISIONAL BIOPSY Right    CARDIAC CATHETERIZATION N/A 01/12/2016   Procedure: Right/Left Heart Cath and Coronary/Graft Angiography;  Surgeon: Lyn Records, MD;  [facility]: [facility] INVASIVE CV LAB;  Service: Cardiovascular;  Laterality: N/A;   CERVICAL POLYPECTOMY N/A 04/23/2013   Procedure: CERVICAL POLYPECTOMY;  Surgeon: Dorien Chihuahua. Richardson Dopp, MD;  Location: [facility];  Service: Gynecology;  Laterality: N/A;  Possible Polypectomy   COLONOSCOPY WITH PROPOFOL  N/A 12/19/2020   Procedure: COLONOSCOPY WITH PROPOFOL;  Surgeon: Jeani Hawking, MD;  Location: [facility]OS[facility];  Service: Endoscopy;  Laterality: N/A;   HYSTEROSCOPY WITH D & C N/A 04/23/2013   Procedure: DILATATION AND CURETTAGE /HYSTEROSCOPY;  Surgeon: Dorien Chihuahua. Richardson Dopp, MD;  Location: [facility];  Service: Gynecology;  Laterality: N/A;   IR ANGIO INTRA EXTRACRAN SEL COM CAROTID INNOMINATE BILAT MOD SED  02/11/2017   IR ANGIO VERTEBRAL SEL VERTEBRAL BILAT MOD SED  02/11/2017   KNEE ARTHROSCOPY Left    LOOP RECORDER INSERTION  N/A 02/18/2017   Procedure: Loop Recorder Insertion;  Surgeon: Regan Lemming, MD;  Location: [facility] INVASIVE CV LAB;  Service: Cardiovascular;  Laterality: N/A;   MITRAL VALVE REPAIR  11/30/2017   MITRAL VALVE REPAIR N/A 11/30/2017   Procedure: MITRAL VALVE REPAIR;  Surgeon: Tonny Bollman, MD;  Location: [facility] INVASIVE CV LAB;  Service: Cardiovascular;  Laterality: N/A;   NEPHRECTOMY Right    due to carcinoma   POLYPECTOMY  12/19/2020   Procedure: POLYPECTOMY;  Surgeon: Jeani Hawking, MD;  Location: [facility];  Service: Endoscopy;;   TEE WITHOUT CARDIOVERSION N/A 02/18/2017   Procedure: TRANSESOPHAGEAL ECHOCARDIOGRAM (TEE) WITH LOOP;  Surgeon: Vesta Mixer, MD;  Location: [facility] ENDOSCOPY;  Service: Cardiovascular;  Laterality: N/A;    MEDICATIONS:  acetaminophen (TYLENOL) 500 MG tablet   acetaminophen (TYLENOL) 650 MG CR tablet   aspirin 81 MG tablet   Bempedoic Acid-Ezetimibe (NEXLIZET) 180-10 MG TABS   carvedilol (COREG) 6.25 MG tablet   clopidogrel (PLAVIX) 75 MG tablet   empagliflozin (JARDIANCE) 10 MG TABS tablet   Evolocumab (REPATHA SURECLICK) 140 MG/ML SOAJ   feeding supplement, ENSURE ENLIVE, (ENSURE ENLIVE) LIQD   folic acid (FOLVITE) 1 MG tablet   furosemide (LASIX) 20 MG tablet   gabapentin (NEURONTIN) 300 MG capsule   Hypromellose (ARTIFICIAL TEARS OP)   levothyroxine (SYNTHROID, LEVOTHROID) 25 MCG tablet   memantine (NAMENDA) 10 MG tablet   pantoprazole (PROTONIX) 40 MG tablet   potassium chloride SA (KLOR-CON) 20 MEQ tablet   Tafamidis (VYNDAMAX) 61 MG CAPS   tiZANidine (ZANAFLEX) 2 MG tablet   Vitamin A 3 MG (10000 UT) TABS   vutrisiran sodium (AMVUTTRA) 25 MG/0.5ML syringe   No current facility-administered medications for this encounter.    Jodell Cipro Ward, PA-C [facility]Surgical Testing [phone]

## 2023-05-11 DIAGNOSIS — H04123 Dry eye syndrome of bilateral lacrimal glands: Secondary | ICD-10-CM | POA: Diagnosis not present

## 2023-05-11 DIAGNOSIS — H524 Presbyopia: Secondary | ICD-10-CM | POA: Diagnosis not present

## 2023-05-11 DIAGNOSIS — H401131 Primary open-angle glaucoma, bilateral, mild stage: Secondary | ICD-10-CM | POA: Diagnosis not present

## 2023-05-11 DIAGNOSIS — H25813 Combined forms of age-related cataract, bilateral: Secondary | ICD-10-CM | POA: Diagnosis not present

## 2023-05-11 DIAGNOSIS — H35373 Puckering of macula, bilateral: Secondary | ICD-10-CM | POA: Diagnosis not present

## 2023-05-14 NOTE — Anesthesia Preprocedure Evaluation (Addendum)
Anesthesia Evaluation  Patient identified by MRN, date of birth, ID band Patient awake    Reviewed: Allergy & Precautions, NPO status , Patient's Chart, lab work & pertinent test results, reviewed documented beta blocker date and time   Airway Mallampati: I       Dental  (+) Edentulous Upper, Edentulous Lower   Pulmonary    Pulmonary exam normal        Cardiovascular hypertension, Pt. on medications and Pt. on home beta blockers +CHF  Normal cardiovascular exam+ Valvular Problems/Murmurs MR   Echo 10/26/2022 1. Left ventricular ejection fraction, by estimation, is 55%. The left  ventricle has normal function. The left ventricle has no regional wall  motion abnormalities. There is mild concentric left ventricular  hypertrophy. Left ventricular diastolic  parameters are indeterminate.   2. Right ventricular systolic function is normal. The right ventricular  size is normal. There is mildly elevated pulmonary artery systolic  pressure. The estimated right ventricular systolic pressure is 40.9 mmHg.   3. Left atrial size was severely dilated.   4. The mitral valve has been repaired/replaced.There is a Mitra-Clip  present in the mitral position. The clip appears well positioned. There is  mild to moderate residual mitral regurgitation. There is no mitral  stenosis. Mean gradient at HR 73bpm  which is stable from prior TTE.   5. The aortic valve is tricuspid. Aortic valve regurgitation is not  visualized. No aortic stenosis is present.   6. The inferior vena cava is normal in size with greater than 50%  respiratory variability, suggesting right atrial pressure of 3 mmHg.   Comparison(s): Prior TTE 09/2021 with mean MV gradient at HR 74bpm  which is stable.     Neuro/Psych  Headaches  Neuromuscular disease CVA, No Residual Symptoms  negative psych ROS   GI/Hepatic negative GI ROS,,,  Endo/Other  Hypothyroidism     Renal/GU Renal InsufficiencyRenal diseaseCKD III  negative genitourinary   Musculoskeletal  (+) Arthritis , Osteoarthritis,    Abdominal Normal abdominal exam  (+)   Peds  Hematology   Anesthesia Other Findings   Reproductive/Obstetrics                             Anesthesia Physical Anesthesia Plan  ASA: 3  Anesthesia Plan: MAC and Spinal   Post-op Pain Management: Minimal or no pain anticipated   Induction: Intravenous  PONV Risk Score and Plan: 2 and Treatment may vary due to age or medical condition  Airway Management Planned: Natural Airway, Simple Face Mask and Nasal Cannula  Additional Equipment: None  Intra-op Plan:   Post-operative Plan:   Informed Consent: I have reviewed the patients History and Physical, chart, labs and discussed the procedure including the risks, benefits and alternatives for the proposed anesthesia with the patient or authorized representative who has indicated his/her understanding and acceptance.       Plan Discussed with: CRNA and Anesthesiologist  Anesthesia Plan Comments: (  )        Anesthesia Quick Evaluation                                  Anesthesia Evaluation  Patient identified by MRN, date of birth, ID band Patient awake

## 2023-05-15 NOTE — H&P (Signed)
Subjective:  Patient is admitted for left total hip arthroplasty.  Patient is a 77 y.o. female presented with a history of pain in the left hip. Onset of symptoms was gradual starting 8 years ago with gradually worsening course since that time. The patient noted no past surgery on the left hip. Patient has been treated conservatively with over-the-counter NSAIDs and activity modification. Patient currently rates pain in the hip at 9 out of 10 with activity. There is pain at night.  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   Severe mitral regurgitation 11/30/2017   Amyloid heart disease (HCC)    Myopericarditis 09/06/2017   Non-rheumatic mitral regurgitation    Chronic diastolic congestive heart failure (HCC) 05/08/2017   CHF exacerbation (HCC) 05/08/2017   Chronic migraine without aura without status migrainosus, not intractable    Hypertension 02/15/2017   Basilar artery occlusion    Cerebellar stroke (HCC) 02/10/2017   Headache 02/10/2017   Chest pain    Mitral regurgitation 01/08/2016   Moderate tricuspid regurgitation 01/08/2016   Pulmonary HTN (HCC) 01/08/2016   Acute CHF (congestive heart failure) (HCC) 01/07/2016   Elevated transaminase level 01/07/2016   Mild hypertension 01/07/2016   CKD (chronic kidney disease), stage III (HCC) 01/07/2016   Hyperlipidemia 01/07/2016   Acute CHF (HCC) 01/07/2016   Past Medical History:  Diagnosis Date   Amyloid heart disease (HCC)    Arthritis    Cancer (HCC)    kidney cancer    Chronic combined systolic and diastolic CHF (congestive heart failure) (HCC)    CKD (chronic kidney disease), stage III (HCC)    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 s/p right nephrectomy ~2007.   History of loop recorder    Hypercholesterolemia    Hypertension    Hypothyroidism    Mitral regurgitation    Pulmonary hypertension (HCC)    Stroke (HCC)    no residual    Past Surgical History:  Procedure Laterality Date   BREAST EXCISIONAL BIOPSY Left    BREAST EXCISIONAL BIOPSY Right    CARDIAC CATHETERIZATION N/A 01/12/2016   Procedure: Right/Left Heart Cath and Coronary/Graft Angiography;  Surgeon: Lyn Records, MD;  Location[facility] INVASIVE CV LAB;  Service: Cardiovascular;  Laterality: N/A;   CERVICAL POLYPECTOMY N/A 04/23/2013   Procedure: CERVICAL POLYPECTOMY;  Surgeon: Dorien Chihuahua. Richardson Dopp, MD;  Location: [facility] ORS;  Service: Gynecology;  Laterality: N/A;  Possible Polypectomy   COLONOSCOPY WITH PROPOFOL N/A 12/19/2020   Procedure: COLONOSCOPY WITH PROPOFOL;  Surgeon: Jeani Hawking, MD;  Location: [facility] ENDOSCOPY;  Service: Endoscopy;  Laterality: N/A;   HYSTEROSCOPY WITH D & C N/A 04/23/2013   Procedure: DILATATION AND CURETTAGE /HYSTEROSCOPY;  Surgeon: Dorien Chihuahua. Richardson Dopp, MD;  Location: [facility] ORS;  Service: Gynecology;  Laterality: N/A;   IR ANGIO INTRA EXTRACRAN SEL COM CAROTID INNOMINATE BILAT MOD SED  02/11/2017   IR ANGIO VERTEBRAL SEL VERTEBRAL BILAT MOD SED  02/11/2017   KNEE ARTHROSCOPY Left  LOOP RECORDER INSERTION N/A 02/18/2017   Procedure: Loop Recorder Insertion;  Surgeon: Regan Lemming, MD;  Location: [facility] INVASIVE CV LAB;  Service: Cardiovascular;  Laterality: N/A;   MITRAL VALVE REPAIR  11/30/2017   MITRAL VALVE REPAIR N/A 11/30/2017   Procedure: MITRAL VALVE REPAIR;  Surgeon: Tonny Bollman, MD;  Location: [facility] INVASIVE CV LAB;  Service: Cardiovascular;  Laterality: N/A;   NEPHRECTOMY Right    due to carcinoma   POLYPECTOMY   12/19/2020   Procedure: POLYPECTOMY;  Surgeon: Jeani Hawking, MD;  Location: [facility] ENDOSCOPY;  Service: Endoscopy;;   TEE WITHOUT CARDIOVERSION N/A 02/18/2017   Procedure: TRANSESOPHAGEAL ECHOCARDIOGRAM (TEE) WITH LOOP;  Surgeon: Vesta Mixer, MD;  Location: [facility] ENDOSCOPY;  Service: Cardiovascular;  Laterality: N/A;    No current facility-administered medications for this encounter.   Current Outpatient Medications  Medication Sig Dispense Refill Last Dose   acetaminophen (TYLENOL) 500 MG tablet Take 1,000 mg by mouth daily.      acetaminophen (TYLENOL) 650 MG CR tablet Take 650 mg by mouth at bedtime as needed for pain.      aspirin 81 MG tablet Take 1 tablet (81 mg total) daily by mouth.      Bempedoic Acid-Ezetimibe (NEXLIZET) 180-10 MG TABS Take 1 tablet by mouth daily. (Patient taking differently: Take 1 tablet by mouth every evening.) 90 tablet 3    carvedilol (COREG) 6.25 MG tablet Take 1 tablet (6.25 mg total) by mouth 2 (two) times daily with a meal. 180 tablet 3    clopidogrel (PLAVIX) 75 MG tablet Take 1 tablet (75 mg total) by mouth daily. 30 tablet 1    empagliflozin (JARDIANCE) 10 MG TABS tablet TAKE 1 TABLET(10 MG) BY MOUTH DAILY BEFORE BREAKFAST 90 tablet 1    Evolocumab (REPATHA SURECLICK) 140 MG/ML SOAJ Inject 140 mg into the skin every 14 (fourteen) days. 6 mL 3    feeding supplement, ENSURE ENLIVE, (ENSURE ENLIVE) LIQD Take 237 mLs by mouth 2 (two) times daily between meals. 237 mL 0    folic acid (FOLVITE) 1 MG tablet Take 1 tablet (1 mg total) by mouth daily. 100 tablet 3    furosemide (LASIX) 20 MG tablet TAKE 2 TABLETS BY MOUTH IN THE MORNING THEN TAKE 1 TABLET BY MOUTH EVERY EVENING 90 tablet 3    gabapentin (NEURONTIN) 300 MG capsule Take 300 mg by mouth 2 (two) times daily.      Hypromellose (ARTIFICIAL TEARS OP) Place 1 drop into both eyes 2 (two) times daily.      levothyroxine (SYNTHROID, LEVOTHROID) 25 MCG tablet Take 25 mcg by mouth daily before breakfast.       memantine (NAMENDA) 10 MG tablet Take 1 tablet (10 mg total) by mouth 2 (two) times daily. 60 tablet 3    pantoprazole (PROTONIX) 40 MG tablet TAKE 1 TABLET(40 MG) BY MOUTH DAILY 90 tablet 3    potassium chloride SA (KLOR-CON) 20 MEQ tablet Take 1 tablet (20 mEq total) by mouth daily. (Patient taking differently: Take 20 mEq by mouth 2 (two) times daily.) 60 tablet 0    Tafamidis (VYNDAMAX) 61 MG CAPS TAKE 1 CAPSULE BY MOUTH ONCE A DAY 30 capsule 11    tiZANidine (ZANAFLEX) 2 MG tablet Take 2 mg by mouth at bedtime as needed for muscle spasms.      Vitamin A 3 MG (10000 UT) TABS Take 3 mg by mouth daily.      vutrisiran sodium (AMVUTTRA) 25 MG/0.5ML syringe Inject 0.5 mLs (25  mg total) into the skin every 3 (three) months. 0.5 mL 3    Allergies  Allergen Reactions   Crestor [Rosuvastatin] Other (See Comments)    Drug interaction w/ tafamidis, caused rhabdomyolysis with CK > 29,000 in 2021   Tramadol Shortness Of Breath and Palpitations    Social History   Tobacco Use   Smoking status: Never   Smokeless tobacco: Never  Substance Use Topics   Alcohol use: No    Family History  Problem Relation Age of Onset   Stroke Brother    Diabetes Mellitus II Brother    Diabetes Mellitus II Sister    Breast cancer Sister    Colon cancer Sister     Review of Systems Pertinent items are noted in HPI. ROS: I have reviewed the patient's review of systems thoroughly and there are no positive responses as relates to the HPI.  Objective: Vital signs in last 24 hours:   Well-developed well-nourished patient in no acute distress. Alert and oriented x3 HEENT:within normal limits Cardiac: Regular rate and rhythm Pulmonary: Lungs clear to auscultation Abdomen: Soft and nontender.  Normal active bowel sounds  Musculoskeletal: (Left hip: Pain to range of motion.  Limited range of motion.  No instability.  Significant pain with internal rotation.)  LMP 02/12/2013   General Appearance:    Alert,  cooperative, no distress, appears stated age  Head:    Normocephalic, without obvious abnormality, atraumatic  Eyes:    PERRL, conjunctiva/corneas clear, EOM's intact, fundi    benign, both eyes  Ears:    Normal TM's and external ear canals, both ears  Nose:   Nares normal, septum midline, mucosa normal, no drainage    or sinus tenderness  Throat:   Lips, mucosa, and tongue normal; teeth and gums normal  Neck:   Supple, symmetrical, trachea midline, no adenopathy;    thyroid:  no enlargement/tenderness/nodules; no carotid   bruit or JVD  Back:     Symmetric, no curvature, ROM normal, no CVA tenderness  Lungs:     Clear to auscultation bilaterally, respirations unlabored  Chest Wall:    No tenderness or deformity   Heart:    Regular rate and rhythm, S1 and S2 normal, no murmur, rub   or gallop  Breast Exam:    No tenderness, masses, or nipple abnormality  Abdomen:     Soft, non-tender, bowel sounds active all four quadrants,    no masses, no organomegaly  Genitalia:    Normal female without lesion, discharge or tenderness  Rectal:    Normal tone, normal prostate, no masses or tenderness;   guaiac negative stool  Extremities:   Extremities normal, atraumatic, no cyanosis or edema  Pulses:   2+ and symmetric all extremities  Skin:   Skin color, texture, turgor normal, no rashes or lesions  Lymph nodes:   Cervical, supraclavicular, and axillary nodes normal  Neurologic:   CNII-XII intact, normal strength, sensation and reflexes    throughout   Recent Results (from the past 2160 hour(s))  Basic metabolic panel     Status: Abnormal   Collection Time: 03/14/23  9:03 AM  Result Value Ref Range   Sodium 139 135 - 145 mmol/L   Potassium 4.1 3.5 - 5.1 mmol/L   Chloride 105 98 - 111 mmol/L   CO2 22 22 - 32 mmol/L   Glucose, Bld 91 70 - 99 mg/dL    Comment: Glucose reference range applies only to samples taken after fasting for at least  8 hours.   BUN 21 8 - 23 mg/dL   Creatinine, Ser 1.61  (H) 0.44 - 1.00 mg/dL   Calcium 9.4 8.9 - 09.6 mg/dL   GFR, Estimated 27 (L) >60 mL/min    Comment: (NOTE) Calculated using the CKD-EPI Creatinine Equation (2021)    Anion gap 12 5 - 15    Comment: Performed at [facility], [address], [place], [place] [address]  B Nat Peptide     Status: Abnormal   Collection Time: 03/14/23  9:03 AM  Result Value Ref Range   B Natriuretic Peptide 181.1 (H) 0.0 - 100.0 pg/mL    Comment: Performed at [facility], [address] [place], [place] [address]    Imaging Review Plain radiographs demonstrate severe degenerative joint disease of the left hip. The bone quality appears to be fair for age and reported activity level.  Assessment/Plan: End stage arthritis, left hip  The patient history, physical examination and imaging studies are consistent with advanced degenerative joint disease of the left hip. The treatment options including medical management and arthroplasty were discussed at length. The risks and benefits of total knee arthroplasty were presented and reviewed. The risks due to aseptic loosening, infection, stiffness, thromboembolic complications among others were discussed.  The patient has not donated Any units of autologous packed red blood cells; the possible need for additional blood was discussed. She agreed to transfusion if necessary. Questions were answered and Pre-op teaching was done by Gus Puma PA-C. The patient acknowledged the explanation, agreed to proceed with the plan and a consent was signed.

## 2023-05-16 ENCOUNTER — Ambulatory Visit (HOSPITAL_COMMUNITY): Payer: 59

## 2023-05-16 ENCOUNTER — Other Ambulatory Visit: Payer: Self-pay

## 2023-05-16 ENCOUNTER — Encounter (HOSPITAL_COMMUNITY)
Admission: AD | Disposition: A | Discharge status: Home / Self Care | Payer: Self-pay | Source: Home / Self Care | Attending: Orthopedic Surgery

## 2023-05-16 ENCOUNTER — Ambulatory Visit (HOSPITAL_BASED_OUTPATIENT_CLINIC_OR_DEPARTMENT_OTHER): Payer: 59 | Admitting: Anesthesiology

## 2023-05-16 ENCOUNTER — Encounter (HOSPITAL_COMMUNITY): Payer: Self-pay | Admitting: Orthopedic Surgery

## 2023-05-16 ENCOUNTER — Inpatient Hospital Stay (HOSPITAL_COMMUNITY)
Admission: AD | Admit: 2023-05-16 | Discharge: 2023-05-18 | DRG: 470 | Disposition: A | Discharge status: Home / Self Care | Payer: 59 | Attending: Orthopedic Surgery | Admitting: Orthopedic Surgery

## 2023-05-16 ENCOUNTER — Ambulatory Visit (HOSPITAL_COMMUNITY): Payer: 59 | Admitting: Physician Assistant

## 2023-05-16 DIAGNOSIS — Z823 Family history of stroke: Secondary | ICD-10-CM

## 2023-05-16 DIAGNOSIS — Z905 Acquired absence of kidney: Secondary | ICD-10-CM

## 2023-05-16 DIAGNOSIS — N183 Chronic kidney disease, stage 3 unspecified: Secondary | ICD-10-CM | POA: Diagnosis present

## 2023-05-16 DIAGNOSIS — M1612 Unilateral primary osteoarthritis, left hip: Secondary | ICD-10-CM | POA: Diagnosis not present

## 2023-05-16 DIAGNOSIS — I5042 Chronic combined systolic (congestive) and diastolic (congestive) heart failure: Secondary | ICD-10-CM | POA: Diagnosis not present

## 2023-05-16 DIAGNOSIS — G629 Polyneuropathy, unspecified: Secondary | ICD-10-CM | POA: Diagnosis present

## 2023-05-16 DIAGNOSIS — Z7984 Long term (current) use of oral hypoglycemic drugs: Secondary | ICD-10-CM | POA: Diagnosis not present

## 2023-05-16 DIAGNOSIS — Z885 Allergy status to narcotic agent status: Secondary | ICD-10-CM | POA: Diagnosis not present

## 2023-05-16 DIAGNOSIS — Z7982 Long term (current) use of aspirin: Secondary | ICD-10-CM

## 2023-05-16 DIAGNOSIS — Z79899 Other long term (current) drug therapy: Secondary | ICD-10-CM

## 2023-05-16 DIAGNOSIS — Z8673 Personal history of transient ischemic attack (TIA), and cerebral infarction without residual deficits: Secondary | ICD-10-CM | POA: Diagnosis not present

## 2023-05-16 DIAGNOSIS — Z96642 Presence of left artificial hip joint: Principal | ICD-10-CM

## 2023-05-16 DIAGNOSIS — Z7902 Long term (current) use of antithrombotics/antiplatelets: Secondary | ICD-10-CM | POA: Diagnosis not present

## 2023-05-16 DIAGNOSIS — E039 Hypothyroidism, unspecified: Secondary | ICD-10-CM

## 2023-05-16 DIAGNOSIS — I272 Pulmonary hypertension, unspecified: Secondary | ICD-10-CM | POA: Diagnosis not present

## 2023-05-16 DIAGNOSIS — Z803 Family history of malignant neoplasm of breast: Secondary | ICD-10-CM

## 2023-05-16 DIAGNOSIS — Z85528 Personal history of other malignant neoplasm of kidney: Secondary | ICD-10-CM

## 2023-05-16 DIAGNOSIS — I130 Hypertensive heart and chronic kidney disease with heart failure and stage 1 through stage 4 chronic kidney disease, or unspecified chronic kidney disease: Secondary | ICD-10-CM | POA: Diagnosis not present

## 2023-05-16 DIAGNOSIS — I9581 Postprocedural hypotension: Secondary | ICD-10-CM | POA: Diagnosis not present

## 2023-05-16 DIAGNOSIS — I43 Cardiomyopathy in diseases classified elsewhere: Secondary | ICD-10-CM | POA: Diagnosis not present

## 2023-05-16 DIAGNOSIS — Z01818 Encounter for other preprocedural examination: Secondary | ICD-10-CM

## 2023-05-16 DIAGNOSIS — I959 Hypotension, unspecified: Secondary | ICD-10-CM | POA: Diagnosis not present

## 2023-05-16 DIAGNOSIS — Z833 Family history of diabetes mellitus: Secondary | ICD-10-CM | POA: Diagnosis not present

## 2023-05-16 DIAGNOSIS — I081 Rheumatic disorders of both mitral and tricuspid valves: Secondary | ICD-10-CM | POA: Diagnosis present

## 2023-05-16 DIAGNOSIS — Z79890 Hormone replacement therapy (postmenopausal): Secondary | ICD-10-CM

## 2023-05-16 DIAGNOSIS — Z888 Allergy status to other drugs, medicaments and biological substances status: Secondary | ICD-10-CM | POA: Diagnosis not present

## 2023-05-16 DIAGNOSIS — E7800 Pure hypercholesterolemia, unspecified: Secondary | ICD-10-CM | POA: Diagnosis present

## 2023-05-16 DIAGNOSIS — E854 Organ-limited amyloidosis: Secondary | ICD-10-CM | POA: Diagnosis present

## 2023-05-16 DIAGNOSIS — Z800 Family history of malignant neoplasm of digestive organs: Secondary | ICD-10-CM

## 2023-05-16 DIAGNOSIS — I5020 Unspecified systolic (congestive) heart failure: Secondary | ICD-10-CM

## 2023-05-16 HISTORY — PX: TOTAL HIP ARTHROPLASTY: SHX124

## 2023-05-16 LAB — CBC
HCT: 43.7 % (ref 36.0–46.0)
Hemoglobin: 14 g/dL (ref 12.0–15.0)
MCH: 32.3 pg (ref 26.0–34.0)
MCHC: 32 g/dL (ref 30.0–36.0)
MCV: 100.7 fL — ABNORMAL HIGH (ref 80.0–100.0)
Platelets: 225 10*3/uL (ref 150–400)
RBC: 4.34 MIL/uL (ref 3.87–5.11)
RDW: 13.4 % (ref 11.5–15.5)
WBC: 4.9 10*3/uL (ref 4.0–10.5)
nRBC: 0 % (ref 0.0–0.2)

## 2023-05-16 LAB — HEMOGLOBIN A1C
Hgb A1c MFr Bld: 5.3 % (ref 4.8–5.6)
Mean Plasma Glucose: 105.41 mg/dL

## 2023-05-16 LAB — BASIC METABOLIC PANEL
Anion gap: 11 (ref 5–15)
BUN: 33 mg/dL — ABNORMAL HIGH (ref 8–23)
CO2: 26 mmol/L (ref 22–32)
Calcium: 10 mg/dL (ref 8.9–10.3)
Chloride: 104 mmol/L (ref 98–111)
Creatinine, Ser: 1.97 mg/dL — ABNORMAL HIGH (ref 0.44–1.00)
GFR, Estimated: 26 mL/min — ABNORMAL LOW (ref >60)
Glucose, Bld: 96 mg/dL (ref 70–99)
Potassium: 3.4 mmol/L — ABNORMAL LOW (ref 3.5–5.1)
Sodium: 141 mmol/L (ref 135–145)

## 2023-05-16 LAB — GLUCOSE, CAPILLARY
Glucose-Capillary: 117 mg/dL — ABNORMAL HIGH (ref 70–99)
Glucose-Capillary: 157 mg/dL — ABNORMAL HIGH (ref 70–99)
Glucose-Capillary: 146 mg/dL — ABNORMAL HIGH (ref 70–99)

## 2023-05-16 LAB — SURGICAL PCR SCREEN
MRSA, PCR: NEGATIVE
Staphylococcus aureus: NEGATIVE

## 2023-05-16 LAB — TYPE AND SCREEN
ABO/RH(D): B POS
Antibody Screen: NEGATIVE

## 2023-05-16 SURGERY — ARTHROPLASTY, HIP, TOTAL, ANTERIOR APPROACH
Anesthesia: Monitor Anesthesia Care | Site: Hip | Laterality: Left

## 2023-05-16 MED ORDER — CARVEDILOL 6.25 MG PO TABS
6.2500 mg | ORAL_TABLET | Freq: Two times a day (BID) | ORAL | Status: DC
  Administered 2023-05-16: 6.25 mg via ORAL
  Filled 2023-05-16 (×2): qty 1

## 2023-05-16 MED ORDER — LIDOCAINE HCL (CARDIAC) PF 100 MG/5ML IV SOSY
PREFILLED_SYRINGE | INTRAVENOUS | Status: DC | PRN
  Administered 2023-05-16: 100 mg via INTRAVENOUS

## 2023-05-16 MED ORDER — LIDOCAINE HCL (PF) 2 % IJ SOLN
INTRAMUSCULAR | Status: AC
  Filled 2023-05-16: qty 5

## 2023-05-16 MED ORDER — POTASSIUM CHLORIDE CRYS ER 20 MEQ PO TBCR
20.0000 meq | EXTENDED_RELEASE_TABLET | Freq: Two times a day (BID) | ORAL | Status: DC
  Administered 2023-05-16 – 2023-05-18 (×5): 20 meq via ORAL
  Filled 2023-05-16 (×5): qty 1

## 2023-05-16 MED ORDER — EMPAGLIFLOZIN 10 MG PO TABS
10.0000 mg | ORAL_TABLET | Freq: Every day | ORAL | Status: DC
  Administered 2023-05-16 – 2023-05-18 (×3): 10 mg via ORAL
  Filled 2023-05-16 (×3): qty 1

## 2023-05-16 MED ORDER — BUPIVACAINE LIPOSOME 1.3 % IJ SUSP
INTRAMUSCULAR | Status: AC
  Filled 2023-05-16: qty 10

## 2023-05-16 MED ORDER — POVIDONE-IODINE 10 % EX SWAB: 2.0000 | Freq: Once | CUTANEOUS | Status: AC

## 2023-05-16 MED ORDER — HYDROMORPHONE HCL 1 MG/ML IJ SOLN: 0.5000 mg | INTRAMUSCULAR | Status: DC | PRN

## 2023-05-16 MED ORDER — MENTHOL 3 MG MT LOZG: 1.0000 | LOZENGE | OROMUCOSAL | Status: DC | PRN

## 2023-05-16 MED ORDER — ONDANSETRON HCL 4 MG PO TABS: 4.0000 mg | ORAL_TABLET | Freq: Four times a day (QID) | ORAL | Status: DC | PRN

## 2023-05-16 MED ORDER — OXYCODONE HCL 5 MG PO TABS: 5.0000 mg | ORAL_TABLET | Freq: Once | ORAL | Status: DC | PRN

## 2023-05-16 MED ORDER — BUPIVACAINE-EPINEPHRINE 0.25% -1:200000 IJ SOLN
INTRAMUSCULAR | Status: AC
  Filled 2023-05-16: qty 1

## 2023-05-16 MED ORDER — DEXAMETHASONE SODIUM PHOSPHATE 10 MG/ML IJ SOLN
INTRAMUSCULAR | Status: AC
  Filled 2023-05-16: qty 1

## 2023-05-16 MED ORDER — DIPHENHYDRAMINE HCL 12.5 MG/5ML PO ELIX: 12.5000 mg | ORAL_SOLUTION | ORAL | Status: DC | PRN

## 2023-05-16 MED ORDER — HYDROMORPHONE HCL 2 MG/ML IJ SOLN
INTRAMUSCULAR | Status: AC
  Filled 2023-05-16: qty 1

## 2023-05-16 MED ORDER — MEPERIDINE HCL 50 MG/ML IJ SOLN: 6.2500 mg | INTRAMUSCULAR | Status: DC | PRN

## 2023-05-16 MED ORDER — PROPOFOL 1000 MG/100ML IV EMUL
INTRAVENOUS | Status: AC
  Filled 2023-05-16: qty 100

## 2023-05-16 MED ORDER — TAFAMIDIS 61 MG PO CAPS: 1.0000 | ORAL_CAPSULE | Freq: Every day | ORAL | Status: DC

## 2023-05-16 MED ORDER — DOCUSATE SODIUM 100 MG PO CAPS
100.0000 mg | ORAL_CAPSULE | Freq: Two times a day (BID) | ORAL | Status: DC
  Administered 2023-05-16 – 2023-05-18 (×4): 100 mg via ORAL
  Filled 2023-05-16 (×4): qty 1

## 2023-05-16 MED ORDER — EPHEDRINE 5 MG/ML INJ
INTRAVENOUS | Status: AC
  Filled 2023-05-16: qty 5

## 2023-05-16 MED ORDER — ACETAMINOPHEN 160 MG/5ML PO SOLN: 325.0000 mg | ORAL | Status: DC | PRN

## 2023-05-16 MED ORDER — BEMPEDOIC ACID-EZETIMIBE 180-10 MG PO TABS: 1.0000 | ORAL_TABLET | Freq: Every evening | ORAL | Status: DC

## 2023-05-16 MED ORDER — ACETAMINOPHEN 500 MG PO TABS
1000.0000 mg | ORAL_TABLET | Freq: Four times a day (QID) | ORAL | Status: AC
  Administered 2023-05-16 – 2023-05-17 (×4): 1000 mg via ORAL
  Filled 2023-05-16 (×4): qty 2

## 2023-05-16 MED ORDER — EPHEDRINE SULFATE (PRESSORS) 50 MG/ML IJ SOLN
INTRAMUSCULAR | Status: DC | PRN
Start: 2023-05-16 — End: 2023-05-16
  Administered 2023-05-16: 7 mg via INTRAVENOUS
  Administered 2023-05-16: 5 mg via INTRAVENOUS

## 2023-05-16 MED ORDER — POLYVINYL ALCOHOL 1.4 % OP SOLN
1.0000 [drp] | Freq: Two times a day (BID) | OPHTHALMIC | Status: DC
  Administered 2023-05-16 – 2023-05-18 (×5): 1 [drp] via OPHTHALMIC
  Filled 2023-05-16: qty 15

## 2023-05-16 MED ORDER — MEMANTINE HCL 10 MG PO TABS: 10.0000 mg | ORAL_TABLET | Freq: Two times a day (BID) | ORAL | Status: DC

## 2023-05-16 MED ORDER — POLYETHYLENE GLYCOL 3350 17 G PO PACK: 17.0000 g | PACK | Freq: Every day | ORAL | Status: DC | PRN

## 2023-05-16 MED ORDER — ORAL CARE MOUTH RINSE: 15.0000 mL | Freq: Once | OROMUCOSAL | Status: AC

## 2023-05-16 MED ORDER — FENTANYL CITRATE PF 50 MCG/ML IJ SOSY: 25.0000 ug | PREFILLED_SYRINGE | INTRAMUSCULAR | Status: DC | PRN

## 2023-05-16 MED ORDER — OXYCODONE HCL 5 MG/5ML PO SOLN: 5.0000 mg | Freq: Once | ORAL | Status: DC | PRN

## 2023-05-16 MED ORDER — OXYCODONE HCL 5 MG PO TABS: 10.0000 mg | ORAL_TABLET | ORAL | Status: DC | PRN

## 2023-05-16 MED ORDER — ENSURE ENLIVE PO LIQD
237.0000 mL | Freq: Two times a day (BID) | ORAL | Status: DC
  Administered 2023-05-16 – 2023-05-18 (×4): 237 mL via ORAL

## 2023-05-16 MED ORDER — MEMANTINE HCL 10 MG PO TABS
5.0000 mg | ORAL_TABLET | Freq: Two times a day (BID) | ORAL | Status: DC
  Administered 2023-05-16 – 2023-05-18 (×4): 5 mg via ORAL
  Filled 2023-05-16 (×4): qty 1

## 2023-05-16 MED ORDER — LEVOTHYROXINE SODIUM 25 MCG PO TABS
25.0000 ug | ORAL_TABLET | Freq: Every day | ORAL | Status: DC
  Administered 2023-05-17 – 2023-05-18 (×2): 25 ug via ORAL
  Filled 2023-05-16 (×2): qty 1

## 2023-05-16 MED ORDER — CELECOXIB 200 MG PO CAPS
200.0000 mg | ORAL_CAPSULE | Freq: Once | ORAL | Status: AC
  Administered 2023-05-16: 200 mg via ORAL
  Filled 2023-05-16: qty 1

## 2023-05-16 MED ORDER — ONDANSETRON HCL 4 MG/2ML IJ SOLN: 4.0000 mg | Freq: Four times a day (QID) | INTRAMUSCULAR | Status: DC | PRN

## 2023-05-16 MED ORDER — ONDANSETRON HCL 4 MG/2ML IJ SOLN
INTRAMUSCULAR | Status: DC | PRN
  Administered 2023-05-16: 4 mg via INTRAVENOUS

## 2023-05-16 MED ORDER — FUROSEMIDE 20 MG PO TABS
20.0000 mg | ORAL_TABLET | Freq: Two times a day (BID) | ORAL | Status: DC
  Filled 2023-05-16: qty 1

## 2023-05-16 MED ORDER — ACETAMINOPHEN 325 MG PO TABS: 325.0000 mg | ORAL_TABLET | ORAL | Status: DC | PRN

## 2023-05-16 MED ORDER — ACETAMINOPHEN 500 MG PO TABS
1000.0000 mg | ORAL_TABLET | Freq: Once | ORAL | Status: AC
  Administered 2023-05-16: 1000 mg via ORAL
  Filled 2023-05-16: qty 2

## 2023-05-16 MED ORDER — FENTANYL CITRATE (PF) 100 MCG/2ML IJ SOLN
INTRAMUSCULAR | Status: AC
  Filled 2023-05-16: qty 2

## 2023-05-16 MED ORDER — WATER FOR IRRIGATION, STERILE IR SOLN
Status: DC | PRN
  Administered 2023-05-16: 1000 mL

## 2023-05-16 MED ORDER — BACITRACIN ZINC 500 UNIT/GM EX OINT
TOPICAL_OINTMENT | CUTANEOUS | Status: AC
  Filled 2023-05-16: qty 28.35

## 2023-05-16 MED ORDER — ASPIRIN 81 MG PO CHEW
81.0000 mg | CHEWABLE_TABLET | Freq: Two times a day (BID) | ORAL | Status: DC
  Administered 2023-05-16 – 2023-05-18 (×4): 81 mg via ORAL
  Filled 2023-05-16 (×4): qty 1

## 2023-05-16 MED ORDER — FLEET ENEMA RE ENEM: 1.0000 | ENEMA | Freq: Once | RECTAL | Status: DC | PRN

## 2023-05-16 MED ORDER — ALBUMIN HUMAN 5 % IV SOLN
INTRAVENOUS | Status: AC
  Filled 2023-05-16: qty 250

## 2023-05-16 MED ORDER — METHOCARBAMOL 500 MG PO TABS: 500.0000 mg | ORAL_TABLET | Freq: Four times a day (QID) | ORAL | Status: DC | PRN

## 2023-05-16 MED ORDER — INSULIN ASPART 100 UNIT/ML IJ SOLN
0.0000 [IU] | Freq: Three times a day (TID) | INTRAMUSCULAR | Status: DC
  Administered 2023-05-16: 3 [IU] via SUBCUTANEOUS
  Administered 2023-05-17: 2 [IU] via SUBCUTANEOUS

## 2023-05-16 MED ORDER — DEXAMETHASONE SODIUM PHOSPHATE 10 MG/ML IJ SOLN
INTRAMUSCULAR | Status: DC | PRN
Start: 2023-05-16 — End: 2023-05-16
  Administered 2023-05-16: 8 mg via INTRAVENOUS

## 2023-05-16 MED ORDER — TRANEXAMIC ACID-NACL 1000-0.7 MG/100ML-% IV SOLN
1000.0000 mg | Freq: Once | INTRAVENOUS | Status: AC
  Administered 2023-05-16: 1000 mg via INTRAVENOUS
  Filled 2023-05-16: qty 100

## 2023-05-16 MED ORDER — FOLIC ACID 1 MG PO TABS
1.0000 mg | ORAL_TABLET | Freq: Every day | ORAL | Status: DC
  Administered 2023-05-16 – 2023-05-18 (×3): 1 mg via ORAL
  Filled 2023-05-16 (×3): qty 1

## 2023-05-16 MED ORDER — TRANEXAMIC ACID-NACL 1000-0.7 MG/100ML-% IV SOLN
1000.0000 mg | INTRAVENOUS | Status: AC
  Administered 2023-05-16: 1000 mg via INTRAVENOUS
  Filled 2023-05-16: qty 100

## 2023-05-16 MED ORDER — ONDANSETRON HCL 4 MG/2ML IJ SOLN: 4.0000 mg | Freq: Once | INTRAMUSCULAR | Status: DC | PRN

## 2023-05-16 MED ORDER — LACTATED RINGERS IV SOLN: INTRAVENOUS | Status: DC

## 2023-05-16 MED ORDER — PROPOFOL 500 MG/50ML IV EMUL
INTRAVENOUS | Status: DC | PRN
  Administered 2023-05-16: 140 mg via INTRAVENOUS

## 2023-05-16 MED ORDER — BUPIVACAINE LIPOSOME 1.3 % IJ SUSP: 10.0000 mL | Freq: Once | INTRAMUSCULAR | Status: AC

## 2023-05-16 MED ORDER — PHENOL 1.4 % MT LIQD: 1.0000 | OROMUCOSAL | Status: DC | PRN

## 2023-05-16 MED ORDER — DEXMEDETOMIDINE HCL IN NACL 80 MCG/20ML IV SOLN
INTRAVENOUS | Status: DC | PRN
Start: 2023-05-16 — End: 2023-05-16
  Administered 2023-05-16 (×2): 4 ug via INTRAVENOUS

## 2023-05-16 MED ORDER — BISACODYL 5 MG PO TBEC: 5.0000 mg | DELAYED_RELEASE_TABLET | Freq: Every day | ORAL | Status: DC | PRN

## 2023-05-16 MED ORDER — SODIUM CHLORIDE 0.9 % IV BOLUS
500.0000 mL | Freq: Four times a day (QID) | INTRAVENOUS | Status: AC | PRN
  Administered 2023-05-17: 500 mL via INTRAVENOUS

## 2023-05-16 MED ORDER — SODIUM CHLORIDE 0.9% FLUSH
3.0000 mL | Freq: Two times a day (BID) | INTRAVENOUS | Status: DC
  Administered 2023-05-16: 3 mL via INTRAVENOUS

## 2023-05-16 MED ORDER — BUPIVACAINE-EPINEPHRINE (PF) 0.25% -1:200000 IJ SOLN
INTRAMUSCULAR | Status: DC | PRN
  Administered 2023-05-16: 40 mL

## 2023-05-16 MED ORDER — CHLORHEXIDINE GLUCONATE 0.12 % MT SOLN
15.0000 mL | Freq: Once | OROMUCOSAL | Status: AC
  Administered 2023-05-16: 15 mL via OROMUCOSAL

## 2023-05-16 MED ORDER — DEXAMETHASONE SODIUM PHOSPHATE 10 MG/ML IJ SOLN
10.0000 mg | Freq: Once | INTRAMUSCULAR | Status: AC
  Administered 2023-05-17: 10 mg via INTRAVENOUS
  Filled 2023-05-16: qty 1

## 2023-05-16 MED ORDER — METOCLOPRAMIDE HCL 5 MG/ML IJ SOLN: 5.0000 mg | Freq: Three times a day (TID) | INTRAMUSCULAR | Status: DC | PRN

## 2023-05-16 MED ORDER — MIDAZOLAM HCL 2 MG/2ML IJ SOLN
INTRAMUSCULAR | Status: AC
  Filled 2023-05-16: qty 2

## 2023-05-16 MED ORDER — METOCLOPRAMIDE HCL 5 MG PO TABS: 5.0000 mg | ORAL_TABLET | Freq: Three times a day (TID) | ORAL | Status: DC | PRN

## 2023-05-16 MED ORDER — ALBUMIN HUMAN 5 % IV SOLN
INTRAVENOUS | Status: DC | PRN
Start: 2023-05-16 — End: 2023-05-16

## 2023-05-16 MED ORDER — ACETAMINOPHEN 325 MG PO TABS
325.0000 mg | ORAL_TABLET | Freq: Four times a day (QID) | ORAL | Status: DC | PRN
  Administered 2023-05-17 – 2023-05-18 (×2): 650 mg via ORAL
  Filled 2023-05-16 (×2): qty 2

## 2023-05-16 MED ORDER — OXYCODONE HCL 5 MG PO TABS: 5.0000 mg | ORAL_TABLET | ORAL | Status: DC | PRN

## 2023-05-16 MED ORDER — METHOCARBAMOL 1000 MG/10ML IJ SOLN: 500.0000 mg | Freq: Four times a day (QID) | INTRAVENOUS | Status: DC | PRN

## 2023-05-16 MED ORDER — CLOPIDOGREL BISULFATE 75 MG PO TABS
75.0000 mg | ORAL_TABLET | Freq: Every day | ORAL | Status: DC
  Administered 2023-05-16 – 2023-05-18 (×3): 75 mg via ORAL
  Filled 2023-05-16 (×3): qty 1

## 2023-05-16 MED ORDER — TIZANIDINE HCL 2 MG PO TABS: 2.0000 mg | ORAL_TABLET | Freq: Four times a day (QID) | ORAL | 0 refills | Status: DC | PRN

## 2023-05-16 MED ORDER — PHENYLEPHRINE HCL-NACL 20-0.9 MG/250ML-% IV SOLN
INTRAVENOUS | Status: DC | PRN
Start: 2023-05-16 — End: 2023-05-16
  Administered 2023-05-16: 40 ug/min via INTRAVENOUS

## 2023-05-16 MED ORDER — ONDANSETRON HCL 4 MG/2ML IJ SOLN
INTRAMUSCULAR | Status: AC
  Filled 2023-05-16: qty 2

## 2023-05-16 MED ORDER — PANTOPRAZOLE SODIUM 40 MG PO TBEC: 40.0000 mg | DELAYED_RELEASE_TABLET | Freq: Every day | ORAL | Status: DC

## 2023-05-16 MED ORDER — CEFAZOLIN SODIUM-DEXTROSE 2-4 GM/100ML-% IV SOLN
2.0000 g | INTRAVENOUS | Status: AC
  Administered 2023-05-16: 2 g via INTRAVENOUS
  Filled 2023-05-16: qty 100

## 2023-05-16 MED ORDER — PHENYLEPHRINE HCL-NACL 20-0.9 MG/250ML-% IV SOLN
INTRAVENOUS | Status: AC
  Filled 2023-05-16: qty 250

## 2023-05-16 MED ORDER — FENTANYL CITRATE (PF) 100 MCG/2ML IJ SOLN
INTRAMUSCULAR | Status: DC | PRN
  Administered 2023-05-16: 25 ug via INTRAVENOUS
  Administered 2023-05-16: 50 ug via INTRAVENOUS
  Administered 2023-05-16: 25 ug via INTRAVENOUS

## 2023-05-16 MED ORDER — PANTOPRAZOLE SODIUM 40 MG PO TBEC
40.0000 mg | DELAYED_RELEASE_TABLET | Freq: Every day | ORAL | Status: DC
  Administered 2023-05-16 – 2023-05-18 (×3): 40 mg via ORAL
  Filled 2023-05-16 (×3): qty 1

## 2023-05-16 MED ORDER — GABAPENTIN 300 MG PO CAPS
300.0000 mg | ORAL_CAPSULE | Freq: Two times a day (BID) | ORAL | Status: DC
  Administered 2023-05-16 – 2023-05-18 (×5): 300 mg via ORAL
  Filled 2023-05-16 (×5): qty 1

## 2023-05-16 MED ORDER — OXYCODONE-ACETAMINOPHEN 5-325 MG PO TABS
1.0000 | ORAL_TABLET | ORAL | 0 refills | Status: DC | PRN
Start: 2023-05-16 — End: 2023-09-20

## 2023-05-16 MED ORDER — SODIUM CHLORIDE 0.9 % IR SOLN
Status: DC | PRN
  Administered 2023-05-16: 1000 mL

## 2023-05-16 SURGICAL SUPPLY — 45 items
APL SKNCLS STERI-STRIP NONHPOA (GAUZE/BANDAGES/DRESSINGS) ×1
ARTICULEZE HEAD (Hips) ×1 IMPLANT
BAG COUNTER SPONGE SURGICOUNT (BAG) IMPLANT
BAG SPEC THK2 15X12 ZIP CLS (MISCELLANEOUS)
BAG SPNG CNTER NS LX DISP (BAG)
BAG ZIPLOCK 12X15 (MISCELLANEOUS) IMPLANT
BENZOIN TINCTURE PRP APPL 2/3 (GAUZE/BANDAGES/DRESSINGS) IMPLANT
BLADE SAW SGTL 18X1.27X75 (BLADE) ×1 IMPLANT
COVER PERINEAL POST (MISCELLANEOUS) ×1 IMPLANT
COVER SURGICAL LIGHT HANDLE (MISCELLANEOUS) ×1 IMPLANT
CUP ACETABULAR GRIPTON 100 52 (Orthopedic Implant) IMPLANT
DRAPE FOOT SWITCH (DRAPES) ×1 IMPLANT
DRAPE STERI IOBAN 125X83 (DRAPES) ×1 IMPLANT
DRAPE U-SHAPE 47X51 STRL (DRAPES) ×2 IMPLANT
DRSG AQUACEL AG ADV 3.5X 6 (GAUZE/BANDAGES/DRESSINGS) ×1 IMPLANT
DURAPREP 26ML APPLICATOR (WOUND CARE) ×1 IMPLANT
ELECT REM PT RETURN 15FT ADLT (MISCELLANEOUS) ×1 IMPLANT
ELIMINATOR HOLE APEX DEPUY (Hips) IMPLANT
GAUZE XEROFORM 1X8 LF (GAUZE/BANDAGES/DRESSINGS) IMPLANT
GLOVE BIOGEL PI IND STRL 8 (GLOVE) ×2 IMPLANT
GLOVE ECLIPSE 7.5 STRL STRAW (GLOVE) ×2 IMPLANT
GOWN STRL REUS W/ TWL XL LVL3 (GOWN DISPOSABLE) ×2 IMPLANT
GOWN STRL REUS W/TWL XL LVL3 (GOWN DISPOSABLE) ×2
GRIPTON 100 52 (Orthopedic Implant) ×1 IMPLANT
HEAD ARTICULEZE (Hips) IMPLANT
HOLDER FOLEY CATH W/STRAP (MISCELLANEOUS) ×1 IMPLANT
HOOD PEEL AWAY T7 (MISCELLANEOUS) ×3 IMPLANT
KIT TURNOVER KIT A (KITS) IMPLANT
LINER NEUTRAL 52X36MM PLUS 4 (Liner) IMPLANT
NDL HYPO 22X1.5 SAFETY MO (MISCELLANEOUS) ×2 IMPLANT
NEEDLE HYPO 22X1.5 SAFETY MO (MISCELLANEOUS) ×2 IMPLANT
PACK ANTERIOR HIP CUSTOM (KITS) ×1 IMPLANT
SPIKE FLUID TRANSFER (MISCELLANEOUS) ×1 IMPLANT
STAPLER VISISTAT 35W (STAPLE) IMPLANT
STEM FEMORAL SZ5 HIGH ACTIS (Stem) IMPLANT
STRIP CLOSURE SKIN 1/2X4 (GAUZE/BANDAGES/DRESSINGS) IMPLANT
SUT ETHIBOND NAB CT1 #1 30IN (SUTURE) ×2 IMPLANT
SUT MNCRL AB 3-0 PS2 18 (SUTURE) IMPLANT
SUT VIC AB 0 CT1 36 (SUTURE) ×1 IMPLANT
SUT VIC AB 1 CT1 36 (SUTURE) ×1 IMPLANT
SUT VIC AB 2-0 CT1 27 (SUTURE) ×1
SUT VIC AB 2-0 CT1 TAPERPNT 27 (SUTURE) ×1 IMPLANT
SUT VICRYL+ 3-0 36IN CT-1 (SUTURE) IMPLANT
TRAY FOLEY MTR SLVR 16FR STAT (SET/KITS/TRAYS/PACK) IMPLANT
TUBE SUCTION HIGH CAP CLEAR NV (SUCTIONS) ×1 IMPLANT

## 2023-05-16 NOTE — Interval H&P Note (Signed)
History and Physical Interval Note:  05/16/2023 7:32 AM  Katie [name]  has presented today for surgery, with the diagnosis of left hip osteoarthritis.  The various methods of treatment have been discussed with the patient and family. After consideration of risks, benefits and other options for treatment, the patient has consented to  Procedure(s): TOTAL HIP ARTHROPLASTY ANTERIOR APPROACH (Left) as a surgical intervention.  The patient's history has been reviewed, patient examined, no change in status, stable for surgery.  I have reviewed the patient's chart and labs.  Questions were answered to the patient's satisfaction.     Harvie Junior

## 2023-05-16 NOTE — Anesthesia Postprocedure Evaluation (Signed)
Anesthesia Post Note  Patient: NATHANIEL YADEN  Procedure(s) Performed: TOTAL HIP ARTHROPLASTY ANTERIOR APPROACH (Left: Hip)     Patient location during evaluation: PACU Anesthesia Type: MAC Level of consciousness: awake and alert Pain management: pain level controlled Vital Signs Assessment: post-procedure vital signs reviewed and stable Respiratory status: spontaneous breathing, nonlabored ventilation, respiratory function stable and patient connected to nasal cannula oxygen Cardiovascular status: stable and blood pressure returned to baseline Postop Assessment: no apparent nausea or vomiting Anesthetic complications: no   No notable events documented.  Last Vitals:  Vitals:   05/16/23 1115 05/16/23 1203  BP: (!) 100/53 (!) 94/58  Pulse: 70 78  Resp: 13 15  Temp:  36.4 C  SpO2: 100% 100%    Last Pain:  Vitals:   05/16/23 1203  TempSrc: Oral  PainSc:                  Carmela Piechowski

## 2023-05-16 NOTE — Transfer of Care (Signed)
Immediate Anesthesia Transfer of Care Note  Patient: Katie [name]  Procedure(s) Performed: TOTAL HIP ARTHROPLASTY ANTERIOR APPROACH (Left: Hip)  Patient Location: PACU  Anesthesia Type:General  Level of Consciousness: awake, alert , oriented, and patient cooperative  Airway & Oxygen Therapy: Patient Spontanous Breathing and Patient connected to face mask oxygen  Post-op Assessment: Report given to RN and Post -op Vital signs reviewed and stable  Post vital signs: Reviewed and stable  Last Vitals:  Vitals Value Taken Time  BP 109/65 05/16/23 1009  Temp 36.1 C 05/16/23 1009  Pulse 96 05/16/23 1012  Resp 13 05/16/23 1012  SpO2 98 % 05/16/23 1012  Vitals shown include unfiled device data.  Last Pain:  Vitals:   05/16/23 0621  TempSrc:   PainSc: 0-No pain      Patients Stated Pain Goal: 5 (05/16/23 9604)  Complications: No notable events documented.

## 2023-05-16 NOTE — Plan of Care (Signed)
Problem: Education: Goal: Knowledge of the prescribed therapeutic regimen will improve Outcome: Progressing   Problem: Activity: Goal: Ability to tolerate increased activity will improve Outcome: Progressing   Problem: Pain Management: Goal: Pain level will decrease with appropriate interventions Outcome: Progressing   Problem: Clinical Measurements: Goal: Ability to maintain clinical measurements within normal limits will improve Outcome: Progressing   Problem: Safety: Goal: Ability to remain free from injury will improve Outcome: Progressing

## 2023-05-16 NOTE — Anesthesia Procedure Notes (Signed)
Procedure Name: LMA Insertion Date/Time: 05/16/2023 7:49 AM  Performed by: Garth Bigness, CRNAPre-anesthesia Checklist: Patient identified, Emergency Drugs available, Suction available and Patient being monitored Patient Re-evaluated:Patient Re-evaluated prior to induction Oxygen Delivery Method: Circle system utilized Preoxygenation: Pre-oxygenation with 100% oxygen Induction Type: IV induction Ventilation: Mask ventilation without difficulty LMA: LMA inserted LMA Size: 4.0 Tube type: Oral Number of attempts: 1 Placement Confirmation: positive ETCO2 and breath sounds checked- equal and bilateral Tube secured with: Tape Dental Injury: Teeth and Oropharynx as per pre-operative assessment

## 2023-05-16 NOTE — Op Note (Signed)
PATIENT ID:      Katie [name]  MRN:     [id number] DOB/AGE:    1946-06-24 / 77 y.o.       OPERATIVE REPORT    DATE OF PROCEDURE:  05/16/2023       PREOPERATIVE DIAGNOSIS:  left hip osteoarthritis                                                       Estimated body mass index is 24.53 kg/m as calculated from the following:   Height as of this encounter: 5\' 6"  (1.676 m).   Weight as of this encounter: 68.9 kg.     POSTOPERATIVE DIAGNOSIS:  left hip osteoarthritis                                                           PROCEDURE:  1. left total hip arthroplasty using a 52 mm DePuy Pinnacle gription  Cup, [employer],  neutral liner, a +4 36 mm ceramic head,  and a #5  Actis stem, 2.interpretation of multiple intraoperative fluoroscopic images   SURGEON: Harvie Junior    ASSISTANT:   Cephus Shelling OPA-C  (present throughout entire procedure and necessary for timely completion of the procedure)  ANESTHESIA: general BLOOD LOSS: 350 Tranexamic Acid: 1 gram IV DRAINS: None COMPLICATIONS: None    NDICATIONS FOR PROCEDURE:Patient with end-stage arthritis of the lefthip.  X-rays show bone-on-bone arthritic changes. Despite conservative measures with observation, anti-inflammatory medicine, narcotics, use of a cane, has severe unremitting pain and can ambulate only less than 1 block before resting.  Patient desires elective right total hip arthroplasty to decrease pain and increase function. The risks, benefits, and alternatives were discussed at length including but not limited to the risks of infection, bleeding, nerve injury, stiffness, blood clots, the need for revision surgery, cardiopulmonary complications, among others, and they were willing to proceed.Benefits have been discussed. Questions answered.     PROCEDURE IN DETAIL: The patient was identified by armband,  received preoperative IV antibiotics in the holding area at [facility], taken to the operating room ,  appropriate anesthetic monitors  were attached and spinal anesthesia was induced.  The patient was placed onto the hot bed and all bony prominences were well-padded.The left hip was prepped and draped for an anterior approach to the hip.  An incision was made and the subcutaneous dissection was down to the level of the tensor fascia.  The fascia was opened and finger dissected.  The bleeders coming across the anterior portion of the hip were identified and cauterized. Retractors were put in place above and below the femoral neck.  The capsule was opened and tagged and a provisional neck cut was made.  The head was removed and sized on the back table.  The acetabulum was sequentially reamed to a level of 51 mm and a 52 mm gription coated pinnacle cup was hammered into place with 45 of lateral opening and 30 of anteversion.fluoroscopy was used to ensure this position of the cup.  Attention was turned towards the femur where the leg was actually rotated, extended, and  adduction did.  The femur was sequentially broached until a size of 5 high offset broach gave a perfect fit and fill.at this point a  +5 mm delta ceramic hip ball was placed and the hip reduced.  Fluoroscopic images were taken to assess the leg length, fit and fill of the stem, and cup position.  We were happy with the construct at this point.  The 5 high offset broach was removed and a final Actis stem with standard offset  and a +5 mmmetalhip ball was placed and reduced.  Final images were taken to make certain there were happy with the position at this point.   The capsule was closed with #1 Vicryl suture.  The tensor fascia was closed with 0 Vicryl suture.  The skin was then closed with combination of 0 and 2-0 Vicryl suture.  The top layer was with 3-0 Monocryl suture.  Benzoin and Steri-Strips were applied  and a sterile compressive dressing was applied and the patient taken to recovery room she noted be in satisfactory condition.  Past  medical Motion for the procedure was approximately 350 cc.  Of note Katie [name]  was with the entire case and assisted by retraction of tissues, manipulation of the leg, and closing the minimize or time.    Harvie Junior 05/16/2023, 9:16 AM

## 2023-05-16 NOTE — Discharge Instructions (Signed)

## 2023-05-16 NOTE — Evaluation (Signed)
Physical Therapy Evaluation Patient Details Name: Katie [name] MRN: [id number] DOB: Apr 06, 1946 Today's Date: 05/16/2023  History of Present Illness  76yo female s/p THA, AA on 05/16/23. PMH: CHF, R nephrectomy, CHF, CKD, HTN, pulmonary HTN  Clinical Impression  Pt is s/p THA resulting in the deficits listed below (see PT Problem List).  Activity limited by dizziness in sitting. Pt reported dizziness worsening after 2 minutes; pt able to scoot along EOB with min assist; returned to supine with BP 90/52, HR 77, SpO2=98% on RA; encouraged pt to drink fluids as IV not currently running. RN made aware  Pt will benefit from acute skilled PT to increase their independence and safety with mobility to facilitate discharge.          If plan is discharge home, recommend the following: Assist for transportation;Help with stairs or ramp for entrance   Can travel by private vehicle        Equipment Recommendations None recommended by PT  Recommendations for Other Services       Functional Status Assessment Patient has had a recent decline in their functional status and demonstrates the ability to make significant improvements in function in a reasonable and predictable amount of time.     Precautions / Restrictions Precautions Precautions: Fall Restrictions Weight Bearing Restrictions: No      Mobility  Bed Mobility Overal bed mobility: Needs Assistance Bed Mobility: Supine to Sit, Sit to Supine     Supine to sit: Min assist, HOB elevated Sit to supine: Min assist   General bed mobility comments: assist with bil LEs, incr time; pt dizzy EOB, pt reported incr dizziness after sitting 2 minutes so assisted back to supine; BP 90/52    Transfers                        Ambulation/Gait                  Stairs            Wheelchair Mobility     Tilt Bed    Modified Rankin (Stroke Patients Only)       Balance Overall balance assessment: Mild deficits  observed, not formally tested, History of Falls (one fall~ 4 mos ago)                                           Pertinent Vitals/Pain Pain Assessment Pain Assessment: No/denies pain    Home Living Family/patient expects to be discharged to:: Private residence   Available Help at Discharge:  (pt unsure) Type of Home: Apartment Home Access: Stairs to enter Entrance Stairs-Rails: Right Entrance Stairs-Number of Steps: 4   Home Layout: One level Home Equipment: Cane - single Librarian, academic (2 wheels)      Prior Function Prior Level of Function : Independent/Modified Independent             Mobility Comments: amb with RW or cane ADLs Comments: ind     Extremity/Trunk Assessment   Upper Extremity Assessment Upper Extremity Assessment: Overall WFL for tasks assessed    Lower Extremity Assessment Lower Extremity Assessment: LLE deficits/detail LLE Deficits / Details: ankle WFL, knee and hip grossly 3/5, AAROM grossly WFL flexion/extension       Communication   Communication Communication: No apparent difficulties  Cognition Arousal: Alert Behavior During Therapy: WFL for tasks  assessed/performed Overall Cognitive Status: Within Functional Limits for tasks assessed                                          General Comments      Exercises Total Joint Exercises Ankle Circles/Pumps: AROM, Both, 10 reps   Assessment/Plan    PT Assessment Patient needs continued PT services  PT Problem List Decreased strength;Decreased range of motion;Decreased activity tolerance;Decreased mobility;Pain;Decreased knowledge of precautions;Decreased balance;Decreased knowledge of use of DME       PT Treatment Interventions DME instruction;Gait training;Functional mobility training;Stair training;Therapeutic activities;Therapeutic exercise;Patient/family education    PT Goals (Current goals can be found in the Care Plan section)  Acute Rehab  PT Goals Patient Stated Goal: return to IND PT Goal Formulation: With patient Time For Goal Achievement: 05/23/23 Potential to Achieve Goals: Good    Frequency 7X/week     Co-evaluation               AM-PAC PT "6 Clicks" Mobility  Outcome Measure Help needed turning from your back to your side while in a flat bed without using bedrails?: A Lot Help needed moving from lying on your back to sitting on the side of a flat bed without using bedrails?: A Lot Help needed moving to and from a bed to a chair (including a wheelchair)?: Total Help needed standing up from a chair using your arms (e.g., wheelchair or bedside chair)?: Total Help needed to walk in hospital room?: Total Help needed climbing 3-5 steps with a railing? : Total 6 Click Score: 8    End of Session Equipment Utilized During Treatment: Gait belt Activity Tolerance: Patient tolerated treatment well Patient left: in bed;with call bell/phone within reach;with bed alarm set Nurse Communication:  (BP and doc in flowsheets) PT Visit Diagnosis: Other abnormalities of gait and mobility (R26.89);Difficulty in walking, not elsewhere classified (R26.2)    Time: 4098-1191 PT Time Calculation (min) (ACUTE ONLY): 19 min   Charges:   PT Evaluation $PT Eval Low Complexity: 1 Low   PT General Charges $$ ACUTE PT VISIT: 1 Visit         Katie [name], PT  Acute Rehab Dept [facility]) [phone]  05/16/2023   [facility] 05/16/2023, 4:12 PM

## 2023-05-17 ENCOUNTER — Encounter (HOSPITAL_COMMUNITY): Payer: Self-pay | Admitting: Orthopedic Surgery

## 2023-05-17 DIAGNOSIS — I5042 Chronic combined systolic (congestive) and diastolic (congestive) heart failure: Secondary | ICD-10-CM | POA: Diagnosis present

## 2023-05-17 DIAGNOSIS — Z905 Acquired absence of kidney: Secondary | ICD-10-CM | POA: Diagnosis not present

## 2023-05-17 DIAGNOSIS — Z7982 Long term (current) use of aspirin: Secondary | ICD-10-CM | POA: Diagnosis not present

## 2023-05-17 DIAGNOSIS — I9581 Postprocedural hypotension: Secondary | ICD-10-CM | POA: Diagnosis present

## 2023-05-17 DIAGNOSIS — I081 Rheumatic disorders of both mitral and tricuspid valves: Secondary | ICD-10-CM | POA: Diagnosis present

## 2023-05-17 DIAGNOSIS — E039 Hypothyroidism, unspecified: Secondary | ICD-10-CM | POA: Diagnosis present

## 2023-05-17 DIAGNOSIS — N183 Chronic kidney disease, stage 3 unspecified: Secondary | ICD-10-CM | POA: Diagnosis present

## 2023-05-17 DIAGNOSIS — Z885 Allergy status to narcotic agent status: Secondary | ICD-10-CM | POA: Diagnosis not present

## 2023-05-17 DIAGNOSIS — M1612 Unilateral primary osteoarthritis, left hip: Principal | ICD-10-CM | POA: Diagnosis present

## 2023-05-17 DIAGNOSIS — Z79899 Other long term (current) drug therapy: Secondary | ICD-10-CM | POA: Diagnosis not present

## 2023-05-17 DIAGNOSIS — E7800 Pure hypercholesterolemia, unspecified: Secondary | ICD-10-CM | POA: Diagnosis present

## 2023-05-17 DIAGNOSIS — I130 Hypertensive heart and chronic kidney disease with heart failure and stage 1 through stage 4 chronic kidney disease, or unspecified chronic kidney disease: Secondary | ICD-10-CM | POA: Diagnosis present

## 2023-05-17 DIAGNOSIS — Z833 Family history of diabetes mellitus: Secondary | ICD-10-CM | POA: Diagnosis not present

## 2023-05-17 DIAGNOSIS — Z888 Allergy status to other drugs, medicaments and biological substances status: Secondary | ICD-10-CM | POA: Diagnosis not present

## 2023-05-17 DIAGNOSIS — Z8673 Personal history of transient ischemic attack (TIA), and cerebral infarction without residual deficits: Secondary | ICD-10-CM | POA: Diagnosis not present

## 2023-05-17 DIAGNOSIS — G629 Polyneuropathy, unspecified: Secondary | ICD-10-CM | POA: Diagnosis present

## 2023-05-17 DIAGNOSIS — E854 Organ-limited amyloidosis: Secondary | ICD-10-CM | POA: Diagnosis present

## 2023-05-17 DIAGNOSIS — I43 Cardiomyopathy in diseases classified elsewhere: Secondary | ICD-10-CM | POA: Diagnosis present

## 2023-05-17 DIAGNOSIS — I272 Pulmonary hypertension, unspecified: Secondary | ICD-10-CM | POA: Diagnosis present

## 2023-05-17 DIAGNOSIS — Z7984 Long term (current) use of oral hypoglycemic drugs: Secondary | ICD-10-CM | POA: Diagnosis not present

## 2023-05-17 DIAGNOSIS — Z7902 Long term (current) use of antithrombotics/antiplatelets: Secondary | ICD-10-CM | POA: Diagnosis not present

## 2023-05-17 DIAGNOSIS — Z823 Family history of stroke: Secondary | ICD-10-CM | POA: Diagnosis not present

## 2023-05-17 DIAGNOSIS — Z85528 Personal history of other malignant neoplasm of kidney: Secondary | ICD-10-CM | POA: Diagnosis not present

## 2023-05-17 DIAGNOSIS — Z79890 Hormone replacement therapy (postmenopausal): Secondary | ICD-10-CM | POA: Diagnosis not present

## 2023-05-17 LAB — GLUCOSE, CAPILLARY
Glucose-Capillary: 109 mg/dL — ABNORMAL HIGH (ref 70–99)
Glucose-Capillary: 100 mg/dL — ABNORMAL HIGH (ref 70–99)
Glucose-Capillary: 145 mg/dL — ABNORMAL HIGH (ref 70–99)
Glucose-Capillary: 164 mg/dL — ABNORMAL HIGH (ref 70–99)

## 2023-05-17 MED ORDER — SODIUM CHLORIDE 0.9 % IV BOLUS
250.0000 mL | Freq: Four times a day (QID) | INTRAVENOUS | Status: DC | PRN
  Administered 2023-05-17: 250 mL via INTRAVENOUS

## 2023-05-17 MED ORDER — SODIUM CHLORIDE 0.9 % IV SOLN: INTRAVENOUS | Status: AC

## 2023-05-17 NOTE — Plan of Care (Signed)
  Problem: Nutritional: Goal: Maintenance of adequate nutrition will improve Outcome: Progressing   Problem: Education: Goal: Knowledge of the prescribed therapeutic regimen will improve Outcome: Progressing   Problem: Activity: Goal: Ability to avoid complications of mobility impairment will improve Outcome: Progressing   Problem: Clinical Measurements: Goal: Postoperative complications will be avoided or minimized Outcome: Progressing   Problem: Pain Management: Goal: Pain level will decrease with appropriate interventions Outcome: Adequate for Discharge   Problem: Elimination: Goal: Will not experience complications related to urinary retention Outcome: Adequate for Discharge   Problem: Safety: Goal: Ability to remain free from injury will improve Outcome: Progressing

## 2023-05-17 NOTE — Progress Notes (Signed)
Physical Therapy Treatment Patient Details Name: Katie [name] MRN: [id number] DOB: 03-Nov-1945 Today's Date: 05/17/2023   History of Present Illness 76yo female s/p THA, AA on 05/16/23. PMH: CHF, R nephrectomy, CHF, CKD, HTN, pulmonary HTN    PT Comments  Pt continues to have soft BP despite bolus and meds beign held; BP in sitting 84/39, pt denied dizziness and requesting to use BSC; assisted pt to [facility] with min assist, amb ~ 5' BSC to recliner. BP after seatedin recliner 121/55; pt denied symptoms throughout session.  RN updated   If plan is discharge home, recommend the following: Assist for transportation;Help with stairs or ramp for entrance   Can travel by private vehicle        Equipment Recommendations  None recommended by PT    Recommendations for Other Services       Precautions / Restrictions Precautions Precautions: Fall Restrictions Weight Bearing Restrictions: No     Mobility  Bed Mobility Overal bed mobility: Needs Assistance Bed Mobility: Supine to Sit     Supine to sit: Contact guard, HOB elevated     General bed mobility comments: pt able to self assist LLE off bed with incr time; sittign BP 84/39 pt denied dizziness    Transfers Overall transfer level: Needs assistance Equipment used: Rolling walker (2 wheels) Transfers: Sit to/from Stand, Bed to chair/wheelchair/BSC Sit to Stand: Contact guard assist, Min assist   Step pivot transfers: Min assist       General transfer comment: hands on assist to steady and for safety d/t low BP; pt denies dizziness    Ambulation/Gait Ambulation/Gait assistance: Contact guard assist, Min assist Gait Distance (Feet): 5 Feet Assistive device: Rolling walker (2 wheels) Gait Pattern/deviations: Step-to pattern       General Gait Details: min assist to CGA for safety, to steady; distance limtied by therapist d/t low BP   Stairs             Wheelchair Mobility     Tilt Bed    Modified Rankin  (Stroke Patients Only)       Balance                                            Cognition Arousal: Alert Behavior During Therapy: WFL for tasks assessed/performed Overall Cognitive Status: Within Functional Limits for tasks assessed                                          Exercises Total Joint Exercises Ankle Circles/Pumps: AROM, Both, 10 reps, Seated Long Arc Quad: AROM, Both, 5 reps, Seated    General Comments        Pertinent Vitals/Pain Pain Assessment Pain Assessment: Faces Faces Pain Scale: Hurts a little bit Pain Location: L hip Pain Descriptors / Indicators: Sore, Tightness Pain Intervention(s): Limited activity within patient's tolerance, Monitored during session, Repositioned    Home Living                          Prior Function            PT Goals (current goals can now be found in the care plan section) Acute Rehab PT Goals Patient Stated Goal: return to IND PT Goal Formulation: With  patient Time For Goal Achievement: 05/23/23 Potential to Achieve Goals: Good Progress towards PT goals: Progressing toward goals    Frequency    7X/week      PT Plan      Co-evaluation              AM-PAC PT "6 Clicks" Mobility   Outcome Measure  Help needed turning from your back to your side while in a flat bed without using bedrails?: A Little Help needed moving from lying on your back to sitting on the side of a flat bed without using bedrails?: A Little Help needed moving to and from a bed to a chair (including a wheelchair)?: A Little Help needed standing up from a chair using your arms (e.g., wheelchair or bedside chair)?: A Little Help needed to walk in hospital room?: A Lot Help needed climbing 3-5 steps with a railing? : A Lot 6 Click Score: 16    End of Session Equipment Utilized During Treatment: Gait belt Activity Tolerance: Patient tolerated treatment well Patient left: in chair;with  call bell/phone within reach;with chair alarm set Nurse Communication: Mobility status PT Visit Diagnosis: Other abnormalities of gait and mobility (R26.89);Difficulty in walking, not elsewhere classified (R26.2)     Time: 9528-4132 PT Time Calculation (min) (ACUTE ONLY): 21 min  Charges:    $Therapeutic Activity: 8-22 mins PT General Charges $$ ACUTE PT VISIT: 1 Visit                     Charm Stenner, PT  Acute Rehab Dept [facility]) [phone]  05/17/2023    [facility] 05/17/2023, 12:36 PM

## 2023-05-17 NOTE — Progress Notes (Signed)
Subjective: 1 Day Post-Op Procedure(s) (LRB): TOTAL HIP ARTHROPLASTY ANTERIOR APPROACH (Left) Patient reports pain as mild.  Foley catheter still in place.  Patient has not been out of bed yet.  Denies dizziness.  Nursing staff tells me that blood pressure has been low.  With systolic below 90.  Objective: Vital signs in last 24 hours: Temp:  [97.5 F (36.4 C)-99 F (37.2 C)] 97.5 F (36.4 C) (10/15 1029) Pulse Rate:  [65-77] 68 (10/15 1029) Resp:  [15-18] 18 (10/15 1029) BP: (82-100)/(43-60) 93/45 (10/15 1029) SpO2:  [98 %-100 %] 100 % (10/15 1029)  Intake/Output from previous day: 10/14 0701 - 10/15 0700 In: 2150.2 [P.O.:300; I.V.:900; IV Piggyback:950.2] Out: 1300 [Urine:950; Blood:350] Intake/Output this shift: Total I/O In: 505.8 [P.O.:240; I.V.:2.5; IV Piggyback:263.3] Out: 300 [Urine:300]  Recent Labs    05/16/23 0613  HGB 14.0   Recent Labs    05/16/23 0613  WBC 4.9  RBC 4.34  HCT 43.7  PLT 225   Recent Labs    05/16/23 0613  NA 141  K 3.4*  CL 104  CO2 26  BUN 33*  CREATININE 1.97*  GLUCOSE 96  CALCIUM 10.0   No results for input(s): "LABPT", "INR" in the last 72 hours. Left hip exam: Neurovascular intact Sensation intact distally Intact pulses distally Dorsiflexion/Plantar flexion intact Incision: dressing C/D/I No cellulitis present Compartment soft   Assessment/Plan: 1 Day Post-Op Procedure(s) (LRB): TOTAL HIP ARTHROPLASTY ANTERIOR APPROACH (Left) Plan: Continue on Plavix and aspirin which she was on preoperatively. Up with therapy Weight-bear as tolerated on the left.  She is given a bolus of IV fluid for her low blood pressure.  We will DC her Foley today.  If she progresses with physical therapy she can potentially be discharged home today.  4 may need to be kept until tomorrow because of her multiple significant medical history.  We will see how she does later today.   Matthew Folks 05/17/2023, 12:21 PM

## 2023-05-17 NOTE — Progress Notes (Signed)
PT TX NOTE--pm session  05/17/23 1500  PT Visit Information  Last PT Received On 05/17/23  Assistance Needed Pt is progressing steadily. Amb hallway distance, denies dizziness. Continue PT in acute setting. Pt will likely be ready to d/c home tomorrow from PT standpoint; spoke with pt dtr Tiffany over the phone, she is asking for resources for [facility] aide; will inform and defer to [facility] team.    History of Present Illness 76yo female s/p THA, AA on 05/16/23. PMH: CHF, R nephrectomy, CHF, CKD, HTN, pulmonary HTN  Subjective Data  Patient Stated Goal return to IND  Precautions  Precautions Fall  Restrictions  Weight Bearing Restrictions No  Pain Assessment  Pain Assessment Faces  Faces Pain Scale 2  Pain Location L hip  Pain Descriptors / Indicators Sore;Tightness  Pain Intervention(s) Limited activity within patient's tolerance;Monitored during session;Repositioned (NT getting ice)  Cognition  Arousal Alert  Behavior During Therapy WFL for tasks assessed/performed  Overall Cognitive Status Within Functional Limits for tasks assessed  Transfers  Overall transfer level Needs assistance  Equipment used Rolling walker (2 wheels)  Transfers Sit to/from Stand;Bed to chair/wheelchair/BSC  Sit to Stand Contact guard assist  General transfer comment cues for hand placement and LLE position  Ambulation/Gait  Ambulation/Gait assistance Contact guard assist  Gait Distance (Feet) 80 Feet  Assistive device Rolling walker (2 wheels)  Gait Pattern/deviations Step-to pattern;Step-through pattern  General Gait Details CGA for safety, cues for sequence and RW posiiton. denies dizziness  Total Joint Exercises  Ankle Circles/Pumps AROM;Both;10 reps;Seated  Quad Sets AROM;10 reps;Both  Heel Slides AROM;Left;5 reps  Hip ABduction/ADduction AROM;Left;10 reps  PT - End of Session  Equipment Utilized During Treatment Gait belt  Activity Tolerance Patient tolerated treatment well  Patient left in chair;with  call bell/phone within reach;with chair alarm set  Nurse Communication Mobility status   PT - Assessment/Plan  PT Visit Diagnosis Other abnormalities of gait and mobility (R26.89);Difficulty in walking, not elsewhere classified (R26.2)  PT Frequency (ACUTE ONLY) 7X/week  Follow Up Recommendations Follow physician's recommendations for discharge plan and follow up therapies  Patient can return home with the following Assist for transportation;Help with stairs or ramp for entrance  PT equipment None recommended by PT  AM-PAC PT "6 Clicks" Mobility Outcome Measure (Version 2)  Help needed turning from your back to your side while in a flat bed without using bedrails? 3  Help needed moving from lying on your back to sitting on the side of a flat bed without using bedrails? 3  Help needed moving to and from a bed to a chair (including a wheelchair)? 3  Help needed standing up from a chair using your arms (e.g., wheelchair or bedside chair)? 3  Help needed to walk in hospital room? 2  Help needed climbing 3-5 steps with a railing?  2  6 Click Score 16  Consider Recommendation of Discharge To: Home with [facility]  PT Goal Progression  Progress towards PT goals Progressing toward goals  Acute Rehab PT Goals  PT Goal Formulation With patient  Time For Goal Achievement 05/23/23  Potential to Achieve Goals Good  PT Time Calculation  PT Start Time (ACUTE ONLY) 1525  PT Stop Time (ACUTE ONLY) 1551  PT Time Calculation (min) (ACUTE ONLY) 26 min  PT General Charges  $$ ACUTE PT VISIT 1 Visit  PT Treatments  $Gait Training 8-22 mins  $Therapeutic Exercise 8-22 mins

## 2023-05-17 NOTE — Plan of Care (Signed)
Problem: Coping: Goal: Ability to adjust to condition or change in health will improve Outcome: Progressing   Problem: Fluid Volume: Goal: Ability to maintain a balanced intake and output will improve Outcome: Progressing   Problem: Health Behavior/Discharge Planning: Goal: Ability to identify and utilize available resources and services will improve Outcome: Progressing Goal: Ability to manage health-related needs will improve Outcome: Progressing   Problem: Metabolic: Goal: Ability to maintain appropriate glucose levels will improve Outcome: Progressing   Problem: Nutritional: Goal: Maintenance of adequate nutrition will improve Outcome: Progressing Goal: Progress toward achieving an optimal weight will improve Outcome: Progressing

## 2023-05-17 NOTE — TOC Transition Note (Signed)
Transition of Care [facility]) - CM/SW Discharge Note   Patient Details  Name: Katie [name] MRN: [id number] Date of Birth: [birth year]  [facility]) CM/SW Contact:  Amada Jupiter, LCSW Phone Number: 05/17/2023, 10:07 AM   Clinical Narrative:     Met with pt who confirms she has needed DME in the home.  OPPT already set up with [facility].  No further TOC needs.  Final next level of care: OP Rehab Barriers to Discharge: No Barriers Identified   Patient Goals and CMS Choice      Discharge Placement                         Discharge Plan and Services Additional resources added to the After Visit Summary for                                       Social Determinants of Health (SDOH) Interventions SDOH Screenings   Food Insecurity: No Food Insecurity (05/16/2023)  Housing: Low Risk  (05/16/2023)  Transportation Needs: No Transportation Needs (05/16/2023)  Utilities: Not At Risk (05/16/2023)  Depression (PHQ2-9): Low Risk  (09/04/2019)  Financial Resource Strain: Low Risk  (09/04/2019)  Tobacco Use: Low Risk  (05/16/2023)     Readmission Risk Interventions     No data to display

## 2023-05-17 NOTE — Care Management Obs Status (Signed)
MEDICARE OBSERVATION STATUS NOTIFICATION   Patient Details  Name: Katie [name] MRN: [id number] Date of Birth: 09/07/1945   Medicare Observation Status Notification Given:  Yes    Amada Jupiter, LCSW 05/17/2023, 10:57 AM

## 2023-05-17 NOTE — Care Plan (Signed)
Change in DC plan - spoke with patient's daughter who is requesting HHPT due to lack of transportation.  HHPT set up with [facility].  Referral sent prior to surgery   Shauna Hugh, [facility]  [phone]

## 2023-05-18 DIAGNOSIS — I959 Hypotension, unspecified: Secondary | ICD-10-CM | POA: Diagnosis not present

## 2023-05-18 LAB — GLUCOSE, CAPILLARY
Glucose-Capillary: 83 mg/dL (ref 70–99)
Glucose-Capillary: 87 mg/dL (ref 70–99)

## 2023-05-18 NOTE — Progress Notes (Signed)
Physical Therapy Treatment Patient Details Name: Katie [name] MRN: [id number] DOB: 11/07/1945 Today's Date: 05/18/2023   History of Present Illness 76yo female s/p THA, AA on 05/16/23. PMH: CHF, R nephrectomy, CHF, CKD, HTN, pulmonary HTN    PT Comments  Pt is meeting goals and is ready to d/c home with family assist as needed from PT standpoint.     If plan is discharge home, recommend the following: Assist for transportation;Help with stairs or ramp for entrance   Can travel by private vehicle        Equipment Recommendations  None recommended by PT    Recommendations for Other Services       Precautions / Restrictions Precautions Precautions: Fall Restrictions Weight Bearing Restrictions: No     Mobility  Bed Mobility Overal bed mobility: Needs Assistance Bed Mobility: Supine to Sit     Supine to sit: Supervision, Modified independent (Device/Increase time), HOB elevated     General bed mobility comments: no physical assist    Transfers Overall transfer level: Needs assistance Equipment used: Rolling walker (2 wheels) Transfers: Sit to/from Stand Sit to Stand: Supervision, Modified independent (Device/Increase time)           General transfer comment: cues for hand placement and LLE position; pt demo's carryover from previous sessions    Ambulation/Gait Ambulation/Gait assistance: Supervision Gait Distance (Feet): 200 Feet Assistive device: Rolling walker (2 wheels) Gait Pattern/deviations: Step-through pattern       General Gait Details: supervision  for safety, cues for sequence and RW posiiton.   Stairs Stairs: Yes Stairs assistance: Contact guard assist, Supervision Stair Management: One rail Left, Step to pattern, Sideways Number of Stairs: 3 General stair comments: cues for single handrail technique, sequence; good stability, no LOB   Wheelchair Mobility     Tilt Bed    Modified Rankin (Stroke Patients Only)       Balance                                             Cognition Arousal: Alert Behavior During Therapy: WFL for tasks assessed/performed Overall Cognitive Status: Within Functional Limits for tasks assessed                                          Exercises Total Joint Exercises Ankle Circles/Pumps: AROM, Both, 10 reps, Seated Heel Slides: AROM, Left, 5 reps Long Arc Quad: AROM, Both, 10 reps, Seated    General Comments        Pertinent Vitals/Pain Pain Assessment Pain Assessment: Faces Faces Pain Scale: Hurts a little bit Pain Location: L hip Pain Descriptors / Indicators: Sore, Tightness Pain Intervention(s): Limited activity within patient's tolerance, Monitored during session, Premedicated before session, Repositioned    Home Living                          Prior Function            PT Goals (current goals can now be found in the care plan section) Acute Rehab PT Goals Patient Stated Goal: return to IND PT Goal Formulation: With patient Time For Goal Achievement: 05/23/23 Potential to Achieve Goals: Good Progress towards PT goals: Progressing toward goals    Frequency  7X/week      PT Plan      Co-evaluation              AM-PAC PT "6 Clicks" Mobility   Outcome Measure  Help needed turning from your back to your side while in a flat bed without using bedrails?: None Help needed moving from lying on your back to sitting on the side of a flat bed without using bedrails?: None Help needed moving to and from a bed to a chair (including a wheelchair)?: A Little Help needed standing up from a chair using your arms (e.g., wheelchair or bedside chair)?: A Little Help needed to walk in hospital room?: A Little Help needed climbing 3-5 steps with a railing? : A Little 6 Click Score: 20    End of Session Equipment Utilized During Treatment: Gait belt Activity Tolerance: Patient tolerated treatment well Patient left:  in chair;with call bell/phone within reach;with chair alarm set Nurse Communication: Mobility status PT Visit Diagnosis: Other abnormalities of gait and mobility (R26.89);Difficulty in walking, not elsewhere classified (R26.2)     Time: 2130-8657 PT Time Calculation (min) (ACUTE ONLY): 18 min  Charges:    $Gait Training: 8-22 mins PT General Charges $$ ACUTE PT VISIT: 1 Visit                     Isla Sabree, PT  Acute Rehab Dept [facility]) [phone]  05/18/2023    [facility] 05/18/2023, 11:06 AM

## 2023-05-18 NOTE — Progress Notes (Signed)
Subjective: 2 Days Post-Op Procedure(s) (LRB): TOTAL HIP ARTHROPLASTY ANTERIOR APPROACH (Left) Patient reports pain as mild.  Taking by mouth and voiding well.  Ambulated 200 feet with physical therapy.  No dizziness.  Objective: Vital signs in last 24 hours: Temp:  [97.7 F (36.5 C)-98.4 F (36.9 C)] 98.4 F (36.9 C) (10/16 0639) Pulse Rate:  [73-82] 82 (10/16 0639) Resp:  [17-19] 17 (10/16 0639) BP: (93-111)/(54-66) 111/62 (10/16 0639) SpO2:  [97 %-100 %] 97 % (10/16 0639)  Intake/Output from previous day: 10/15 0701 - 10/16 0700 In: 3619 [P.O.:930; I.V.:1240; IV Piggyback:1449] Out: 900 [Urine:900] Intake/Output this shift: Total I/O In: 240 [P.O.:240] Out: -   Recent Labs    05/16/23 0613  HGB 14.0   Recent Labs    05/16/23 0613  WBC 4.9  RBC 4.34  HCT 43.7  PLT 225   Recent Labs    05/16/23 0613  NA 141  K 3.4*  CL 104  CO2 26  BUN 33*  CREATININE 1.97*  GLUCOSE 96  CALCIUM 10.0   No results for input(s): "LABPT", "INR" in the last 72 hours. Left hip exam: Neurovascular intact Sensation intact distally Intact pulses distally Dorsiflexion/Plantar flexion intact Incision: dressing C/D/I No cellulitis present Compartment soft   Assessment/Plan: 2 Days Post-Op Procedure(s) (LRB): TOTAL HIP ARTHROPLASTY ANTERIOR APPROACH (Left) Postoperative hypotension.   Plan:  Discharge home with home health Weight-bear as tolerated on left lower extremity.  No hip precautions. She has resumed aspirin and Plavix which she takes at home.  Her postoperative medications have been sent to her pharmacy. Follow-up with Dr. Luiz Blare in 2 weeks.   Anticipated LOS equal to or greater than 2 midnights due to - Age 78 and older with one or more of the following:  - Obesity  - Expected need for hospital services (PT, OT, Nursing) required for safe  discharge    OR   - Unanticipated findings during/Post Surgery: Hypotension   - Patient is a high risk of  re-admission due to: None   Matthew Folks 05/18/2023, 12:45 PM

## 2023-05-18 NOTE — Discharge Summary (Signed)
Patient ID: Katie [name] MRN: [id number] DOB/AGE: Jul 08, 1946 77 y.o.  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 s/p right nephrectomy ~2007.   History of loop recorder    Hypercholesterolemia    Hypertension    Hypothyroidism    Mitral regurgitation    Pulmonary hypertension (HCC)    Stroke [facility])    no residual    Surgeries: Procedure(s): Left TOTAL HIP ARTHROPLASTY ANTERIOR APPROACH on 05/16/2023   Consultants:   Discharged Condition: Improved  Hospital Course: Katie [name] is an 77 y.o. female who was admitted 05/16/2023 for operative treatment ofPrimary osteoarthritis of left hip. Patient has severe unremitting pain that affects sleep, daily activities, and work/hobbies. After pre-op clearance the patient was taken to the operating room on 05/16/2023 and underwent  Procedure(s): Left TOTAL HIP ARTHROPLASTY ANTERIOR APPROACH.    Patient was given perioperative antibiotics:  Anti-infectives (From admission, onward)    Start     Dose/Rate Route Frequency Ordered Stop   05/16/23 0600  ceFAZolin (ANCEF) IVPB 2g/100 mL premix        2 g 200 mL/hr over 30 Minutes Intravenous On call to O.R. 05/16/23 0533 05/16/23 0757        Patient was given sequential compression devices, early ambulation, and chemoprophylaxis to prevent DVT.  Patient benefited maximally from hospital stay and there were no complications.    Recent vital signs: Patient  Vitals for the past 24 hrs:  BP Temp Temp src Pulse Resp SpO2  05/18/23 0639 111/62 98.4 F (36.9 C) Oral 82 17 97 %  05/17/23 2141 (!) 93/54 97.7 F (36.5 C) Oral 73 17 100 %  05/17/23 1635 (!) 109/56 -- -- 75 -- --  05/17/23 1320 94/66 98.3 F (36.8 C) Oral 74 19 99 %     Recent laboratory studies:  Recent Labs    05/16/23 0613  WBC 4.9  HGB 14.0  HCT 43.7  PLT 225  NA 141  K 3.4*  CL 104  CO2 26  BUN 33*  CREATININE 1.97*  GLUCOSE 96  CALCIUM 10.0     Discharge Medications:   Allergies as of 05/18/2023       Reactions   Crestor [rosuvastatin] Other (See Comments)   Drug interaction w/ tafamidis, caused rhabdomyolysis with CK > 29,000 in 2021   Tramadol Shortness Of Breath, Palpitations        Medication List     TAKE these medications    acetaminophen 500 MG tablet Commonly known as: TYLENOL Take 1,000 mg by mouth daily.   acetaminophen 650 MG CR tablet Commonly known as: TYLENOL Take 650 mg by mouth at bedtime as needed for pain.   Amvuttra 25 MG/0.5ML syringe Generic drug: vutrisiran sodium Inject 0.5 mLs (25 mg total) into the skin every 3 (three) months.   ARTIFICIAL TEARS OP Place 1 drop into both eyes 2 (  two) times daily.   aspirin 81 MG tablet Take 1 tablet (81 mg total) daily by mouth.   carvedilol 6.25 MG tablet Commonly known as: COREG Take 1 tablet (6.25 mg total) by mouth 2 (two) times daily with a meal.   clopidogrel 75 MG tablet Commonly known as: PLAVIX Take 1 tablet (75 mg total) by mouth daily.   feeding supplement Liqd Take 237 mLs by mouth 2 (two) times daily between meals.   folic acid 1 MG tablet Commonly known as: FOLVITE Take 1 tablet (1 mg total) by mouth daily.   furosemide 20 MG tablet Commonly known as: LASIX TAKE 2 TABLETS BY MOUTH IN THE MORNING THEN TAKE 1 TABLET BY MOUTH EVERY EVENING   gabapentin 300 MG capsule Commonly known as: NEURONTIN Take 300 mg by mouth 2 (two) times daily.   Jardiance 10  MG Tabs tablet Generic drug: empagliflozin TAKE 1 TABLET(10 MG) BY MOUTH DAILY BEFORE BREAKFAST   levothyroxine 25 MCG tablet Commonly known as: SYNTHROID Take 25 mcg by mouth daily before breakfast.   memantine 10 MG tablet Commonly known as: Namenda Take 1 tablet (10 mg total) by mouth 2 (two) times daily.   Nexlizet 180-10 MG Tabs Generic drug: Bempedoic Acid-Ezetimibe Take 1 tablet by mouth daily. What changed: when to take this   oxyCODONE-acetaminophen 5-325 MG tablet Commonly known as: PERCOCET/ROXICET Take 1 tablet by mouth every 4 (four) hours as needed for severe pain.   pantoprazole 40 MG tablet Commonly known as: PROTONIX TAKE 1 TABLET(40 MG) BY MOUTH DAILY   potassium chloride SA 20 MEQ tablet Commonly known as: KLOR-CON M Take 1 tablet (20 mEq total) by mouth daily. What changed: when to take this   Repatha SureClick 140 MG/ML Soaj Generic drug: Evolocumab Inject 140 mg into the skin every 14 (fourteen) days.   tiZANidine 2 MG tablet Commonly known as: ZANAFLEX Take 2 mg by mouth at bedtime as needed for muscle spasms. What changed: Another medication with the same name was added. Make sure you understand how and when to take each.   tiZANidine 2 MG tablet Commonly known as: ZANAFLEX Take 1 tablet (2 mg total) by mouth every 6 (six) hours as needed for muscle spasms. What changed: You were already taking a medication with the same name, and this prescription was added. Make sure you understand how and when to take each.   Vitamin A 3 MG (10000 UT) Tabs Take 3 mg by mouth daily.   Vyndamax 61 MG Caps Generic drug: Tafamidis TAKE 1 CAPSULE BY MOUTH ONCE A DAY               Durable Medical Equipment  (From admission, onward)           Start     Ordered   05/16/23 1201  DME Walker rolling  Once       Question:  Patient needs a walker to treat with the following condition  Answer:  Status post total hip replacement, left   05/16/23 1200    05/16/23 1201  DME 3 n 1  Once        05/16/23 1200              Discharge Care Instructions  (From admission, onward)           Start     Ordered   05/18/23 0000  Weight bearing as tolerated       Question Answer Comment  Laterality left  Extremity Lower      05/18/23 1255            Diagnostic Studies: DG Chest 2 View  Result Date: 05/16/2023 CLINICAL DATA:  Preop chest exam.  Left hip replacement. EXAM: CHEST - 2 VIEW COMPARISON:  Radiograph 06/05/2020 FINDINGS: Stable heart size and mediastinal contours. Left chest wall loop recorder, pulmonary artery catheter monitor and intracardiac device unchanged. No pulmonary edema. No focal airspace disease. No pleural effusion or pneumothorax. Mild thoracic spondylosis IMPRESSION: No acute chest findings. Electronically Signed   By: Narda Rutherford M.D.   On: 05/16/2023 09:36   DG HIP UNILAT WITH PELVIS 1V LEFT  Result Date: 05/16/2023 CLINICAL DATA:  Elective surgery. EXAM: DG HIP (WITH OR WITHOUT PELVIS) 1V*L* COMPARISON:  None Available. FINDINGS: Two fluoroscopic spot views of the pelvis and left hip obtained in the operating room. Images during hip arthroplasty. Fluoroscopy time 18 seconds. Dose 1.1452 mGy. IMPRESSION: Intraoperative fluoroscopy for left hip arthroplasty. Electronically Signed   By: Narda Rutherford M.D.   On: 05/16/2023 09:35   DG C-Arm 1-60 Min-No Report  Result Date: 05/16/2023 Fluoroscopy was utilized by the requesting physician.  No radiographic interpretation.    Disposition: Discharge disposition: 01-Home or Self Care       Discharge Instructions     Call MD / Call 911   Complete by: As directed    If you experience chest pain or shortness of breath, CALL 911 and be transported to the hospital emergency room.  If you develope a fever above 101 F, pus (white drainage) or increased drainage or redness at the wound, or calf pain, call your surgeon's office.   Constipation Prevention    Complete by: As directed    Drink plenty of fluids.  Prune juice may be helpful.  You may use a stool softener, such as Colace (over the counter) 100 mg twice a day.  Use MiraLax (over the counter) for constipation as needed.   Diet Carb Modified   Complete by: As directed    Increase activity slowly as tolerated   Complete by: As directed    Post-operative opioid taper instructions:   Complete by: As directed    POST-OPERATIVE OPIOID TAPER INSTRUCTIONS: It is important to wean off of your opioid medication as soon as possible. If you do not need pain medication after your surgery it is ok to stop day one. Opioids include: Codeine, Hydrocodone(Norco, Vicodin), Oxycodone(Percocet, oxycontin) and hydromorphone amongst others.  Long term and even short term use of opiods can cause: Increased pain response Dependence Constipation Depression Respiratory depression And more.  Withdrawal symptoms can include Flu like symptoms Nausea, vomiting And more Techniques to manage these symptoms Hydrate well Eat regular healthy meals Stay active Use relaxation techniques(deep breathing, meditating, yoga) Do Not substitute Alcohol to help with tapering If you have been on opioids for less than two weeks and do not have pain than it is ok to stop all together.  Plan to wean off of opioids This plan should start within one week post op of your joint replacement. Maintain the same interval or time between taking each dose and first decrease the dose.  Cut the total daily intake of opioids by one tablet each day Next start to increase the time between doses. The last dose that should be eliminated is the evening dose.      Weight bearing as tolerated   Complete by: As directed    Laterality: left   Extremity: Lower  Follow-up Information     Jodi Geralds, MD. Go on 05/31/2023.   Specialty: Orthopedic Surgery Why: Your appointment is scheduled for 9:15. Contact information: [address] [place] [address] [phone]         [facility] Follow up.   Why: HHPT will provide 6 HHPT visits prior to follow up with Dr Luiz Blare.  at that point, OPPT will be discussed if needed.                 Signed: Matthew Folks 05/18/2023, 12:56 PM

## 2023-05-18 NOTE — Progress Notes (Signed)
Physical Therapy Treatment Patient Details Name: Katie [name] MRN: [id number] DOB: 09/14/1945 Today's Date: 05/18/2023   History of Present Illness 77yo female s/p THA, AA on 05/16/23. PMH: CHF, R nephrectomy, CHF, CKD, HTN, pulmonary HTN    PT Comments  Pt continues to progress steadily; instructed in technique for LB dressing, safety with STS/RW when completing LB dressing as well as seated position for UB dressing in preparation for d/c this afternoon. Pt with improving wt shift to LLE with gait in room and able to maneuver RW in tighter spaces without LOB   If plan is discharge home, recommend the following: Assist for transportation;Help with stairs or ramp for entrance   Can travel by private vehicle        Equipment Recommendations  None recommended by PT    Recommendations for Other Services       Precautions / Restrictions Precautions Precautions: Fall Restrictions Weight Bearing Restrictions: No     Mobility  Bed Mobility Overal bed mobility: Needs Assistance Bed Mobility: Supine to Sit     Supine to sit: Supervision, Modified independent (Device/Increase time), HOB elevated     General bed mobility comments: no physical assist    Transfers Overall transfer level: Needs assistance Equipment used: Rolling walker (2 wheels) Transfers: Sit to/from Stand Sit to Stand: Supervision, Modified independent (Device/Increase time)           General transfer comment: cues for hand placement and LLE position; pt demo's carryover from previous sessions; STS from toilet and bed    Ambulation/Gait Ambulation/Gait assistance: Supervision Gait Distance (Feet): 15 Feet Assistive device: Rolling walker (2 wheels) Gait Pattern/deviations: Step-through pattern       General Gait Details: supervision  for safety, no LOB   Stairs Stairs: Yes Stairs assistance: Contact guard assist, Supervision Stair Management: One rail Left, Step to pattern, Sideways Number of  Stairs: 3 General stair comments: cues for single handrail technique, sequence; good stability, no LOB   Wheelchair Mobility     Tilt Bed    Modified Rankin (Stroke Patients Only)       Balance                                            Cognition Arousal: Alert Behavior During Therapy: WFL for tasks assessed/performed Overall Cognitive Status: Within Functional Limits for tasks assessed                                          Exercises   General Comments General comments (skin integrity, edema, etc.): instructed and assisted with LB/UB dressing; min to mod assist to don LB garments. IND UB dressing after set up      Pertinent Vitals/Pain Pain Assessment Pain Assessment: Faces Faces Pain Scale: Hurts a little bit Pain Location: L hip Pain Descriptors / Indicators: Sore, Tightness Pain Intervention(s): Limited activity within patient's tolerance, Monitored during session, Premedicated before session, Repositioned    Home Living                          Prior Function            PT Goals (current goals can now be found in the care plan section) Acute Rehab PT Goals Patient  Stated Goal: return to IND PT Goal Formulation: With patient Time For Goal Achievement: 05/23/23 Potential to Achieve Goals: Good Progress towards PT goals: Progressing toward goals    Frequency    7X/week      PT Plan      Co-evaluation              AM-PAC PT "6 Clicks" Mobility   Outcome Measure  Help needed turning from your back to your side while in a flat bed without using bedrails?: None Help needed moving from lying on your back to sitting on the side of a flat bed without using bedrails?: None Help needed moving to and from a bed to a chair (including a wheelchair)?: None Help needed standing up from a chair using your arms (e.g., wheelchair or bedside chair)?: None Help needed to walk in hospital room?: A Little Help  needed climbing 3-5 steps with a railing? : A Little 6 Click Score: 22    End of Session Equipment Utilized During Treatment: Gait belt Activity Tolerance: Patient tolerated treatment well Patient left: in chair;with call bell/phone within reach;with chair alarm set Nurse Communication: Mobility status PT Visit Diagnosis: Other abnormalities of gait and mobility (R26.89);Difficulty in walking, not elsewhere classified (R26.2)     Time: 4403-4742 PT Time Calculation (min) (ACUTE ONLY): 23 min  Charges:    $Gait Training: 8-22 mins $Self Care/Home Management: 8-22 PT General Charges $$ ACUTE PT VISIT: 1 Visit                     Windell Musson, PT  Acute Rehab Dept [facility]) [phone]  05/18/2023    [facility] 05/18/2023, 2:32 PM

## 2023-05-18 NOTE — Plan of Care (Signed)
  Problem: Education: Goal: Ability to describe self-care measures that may prevent or decrease complications (Diabetes Survival Skills Education) will improve Outcome: Progressing   Problem: Education: Goal: Knowledge of the prescribed therapeutic regimen will improve Outcome: Progressing Goal: Understanding of discharge needs will improve Outcome: Progressing Goal: Individualized Educational Video(s) Outcome: Progressing   Problem: Activity: Goal: Ability to avoid complications of mobility impairment will improve Outcome: Progressing

## 2023-05-19 ENCOUNTER — Encounter (HOSPITAL_COMMUNITY): Payer: Self-pay | Admitting: Orthopedic Surgery

## 2023-05-19 ENCOUNTER — Other Ambulatory Visit (HOSPITAL_COMMUNITY): Payer: Self-pay

## 2023-05-19 DIAGNOSIS — I081 Rheumatic disorders of both mitral and tricuspid valves: Secondary | ICD-10-CM | POA: Diagnosis not present

## 2023-05-19 DIAGNOSIS — Z905 Acquired absence of kidney: Secondary | ICD-10-CM | POA: Diagnosis not present

## 2023-05-19 DIAGNOSIS — Z85528 Personal history of other malignant neoplasm of kidney: Secondary | ICD-10-CM | POA: Diagnosis not present

## 2023-05-19 DIAGNOSIS — I5042 Chronic combined systolic (congestive) and diastolic (congestive) heart failure: Secondary | ICD-10-CM | POA: Diagnosis not present

## 2023-05-19 DIAGNOSIS — Z604 Social exclusion and rejection: Secondary | ICD-10-CM | POA: Diagnosis not present

## 2023-05-19 DIAGNOSIS — E039 Hypothyroidism, unspecified: Secondary | ICD-10-CM | POA: Diagnosis not present

## 2023-05-19 DIAGNOSIS — Z96642 Presence of left artificial hip joint: Secondary | ICD-10-CM | POA: Diagnosis not present

## 2023-05-19 DIAGNOSIS — Z8673 Personal history of transient ischemic attack (TIA), and cerebral infarction without residual deficits: Secondary | ICD-10-CM | POA: Diagnosis not present

## 2023-05-19 DIAGNOSIS — Z7982 Long term (current) use of aspirin: Secondary | ICD-10-CM | POA: Diagnosis not present

## 2023-05-19 DIAGNOSIS — Z7902 Long term (current) use of antithrombotics/antiplatelets: Secondary | ICD-10-CM | POA: Diagnosis not present

## 2023-05-19 DIAGNOSIS — I272 Pulmonary hypertension, unspecified: Secondary | ICD-10-CM | POA: Diagnosis not present

## 2023-05-19 DIAGNOSIS — N183 Chronic kidney disease, stage 3 unspecified: Secondary | ICD-10-CM | POA: Diagnosis not present

## 2023-05-19 DIAGNOSIS — I9581 Postprocedural hypotension: Secondary | ICD-10-CM | POA: Diagnosis not present

## 2023-05-19 DIAGNOSIS — Z471 Aftercare following joint replacement surgery: Secondary | ICD-10-CM | POA: Diagnosis not present

## 2023-05-19 DIAGNOSIS — Z79891 Long term (current) use of opiate analgesic: Secondary | ICD-10-CM | POA: Diagnosis not present

## 2023-05-19 DIAGNOSIS — I5032 Chronic diastolic (congestive) heart failure: Secondary | ICD-10-CM

## 2023-05-19 DIAGNOSIS — Z7984 Long term (current) use of oral hypoglycemic drugs: Secondary | ICD-10-CM | POA: Diagnosis not present

## 2023-05-19 DIAGNOSIS — I130 Hypertensive heart and chronic kidney disease with heart failure and stage 1 through stage 4 chronic kidney disease, or unspecified chronic kidney disease: Secondary | ICD-10-CM | POA: Diagnosis not present

## 2023-05-19 MED ORDER — FUROSEMIDE 20 MG PO TABS: ORAL_TABLET | ORAL | 1 refills | Status: DC

## 2023-05-19 NOTE — Addendum Note (Signed)
Addendum  created 05/19/23 1218 by Bethena Midget, MD   Clinical Note Signed

## 2023-05-20 ENCOUNTER — Other Ambulatory Visit: Payer: Self-pay

## 2023-05-20 ENCOUNTER — Other Ambulatory Visit (HOSPITAL_COMMUNITY): Payer: Self-pay

## 2023-05-23 DIAGNOSIS — I081 Rheumatic disorders of both mitral and tricuspid valves: Secondary | ICD-10-CM | POA: Diagnosis not present

## 2023-05-23 DIAGNOSIS — Z471 Aftercare following joint replacement surgery: Secondary | ICD-10-CM | POA: Diagnosis not present

## 2023-05-23 DIAGNOSIS — I272 Pulmonary hypertension, unspecified: Secondary | ICD-10-CM | POA: Diagnosis not present

## 2023-05-23 DIAGNOSIS — Z8673 Personal history of transient ischemic attack (TIA), and cerebral infarction without residual deficits: Secondary | ICD-10-CM | POA: Diagnosis not present

## 2023-05-23 DIAGNOSIS — Z604 Social exclusion and rejection: Secondary | ICD-10-CM | POA: Diagnosis not present

## 2023-05-23 DIAGNOSIS — Z96642 Presence of left artificial hip joint: Secondary | ICD-10-CM | POA: Diagnosis not present

## 2023-05-23 DIAGNOSIS — N183 Chronic kidney disease, stage 3 unspecified: Secondary | ICD-10-CM | POA: Diagnosis not present

## 2023-05-23 DIAGNOSIS — Z905 Acquired absence of kidney: Secondary | ICD-10-CM | POA: Diagnosis not present

## 2023-05-23 DIAGNOSIS — Z7902 Long term (current) use of antithrombotics/antiplatelets: Secondary | ICD-10-CM | POA: Diagnosis not present

## 2023-05-23 DIAGNOSIS — E039 Hypothyroidism, unspecified: Secondary | ICD-10-CM | POA: Diagnosis not present

## 2023-05-23 DIAGNOSIS — Z7982 Long term (current) use of aspirin: Secondary | ICD-10-CM | POA: Diagnosis not present

## 2023-05-23 DIAGNOSIS — Z79891 Long term (current) use of opiate analgesic: Secondary | ICD-10-CM | POA: Diagnosis not present

## 2023-05-23 DIAGNOSIS — I130 Hypertensive heart and chronic kidney disease with heart failure and stage 1 through stage 4 chronic kidney disease, or unspecified chronic kidney disease: Secondary | ICD-10-CM | POA: Diagnosis not present

## 2023-05-23 DIAGNOSIS — I9581 Postprocedural hypotension: Secondary | ICD-10-CM | POA: Diagnosis not present

## 2023-05-23 DIAGNOSIS — I5042 Chronic combined systolic (congestive) and diastolic (congestive) heart failure: Secondary | ICD-10-CM | POA: Diagnosis not present

## 2023-05-23 DIAGNOSIS — Z85528 Personal history of other malignant neoplasm of kidney: Secondary | ICD-10-CM | POA: Diagnosis not present

## 2023-05-23 DIAGNOSIS — Z7984 Long term (current) use of oral hypoglycemic drugs: Secondary | ICD-10-CM | POA: Diagnosis not present

## 2023-05-25 DIAGNOSIS — Z905 Acquired absence of kidney: Secondary | ICD-10-CM | POA: Diagnosis not present

## 2023-05-25 DIAGNOSIS — Z8673 Personal history of transient ischemic attack (TIA), and cerebral infarction without residual deficits: Secondary | ICD-10-CM | POA: Diagnosis not present

## 2023-05-25 DIAGNOSIS — I130 Hypertensive heart and chronic kidney disease with heart failure and stage 1 through stage 4 chronic kidney disease, or unspecified chronic kidney disease: Secondary | ICD-10-CM | POA: Diagnosis not present

## 2023-05-25 DIAGNOSIS — I081 Rheumatic disorders of both mitral and tricuspid valves: Secondary | ICD-10-CM | POA: Diagnosis not present

## 2023-05-25 DIAGNOSIS — Z471 Aftercare following joint replacement surgery: Secondary | ICD-10-CM | POA: Diagnosis not present

## 2023-05-25 DIAGNOSIS — Z7902 Long term (current) use of antithrombotics/antiplatelets: Secondary | ICD-10-CM | POA: Diagnosis not present

## 2023-05-25 DIAGNOSIS — N183 Chronic kidney disease, stage 3 unspecified: Secondary | ICD-10-CM | POA: Diagnosis not present

## 2023-05-25 DIAGNOSIS — Z604 Social exclusion and rejection: Secondary | ICD-10-CM | POA: Diagnosis not present

## 2023-05-25 DIAGNOSIS — Z7982 Long term (current) use of aspirin: Secondary | ICD-10-CM | POA: Diagnosis not present

## 2023-05-25 DIAGNOSIS — I5042 Chronic combined systolic (congestive) and diastolic (congestive) heart failure: Secondary | ICD-10-CM | POA: Diagnosis not present

## 2023-05-25 DIAGNOSIS — Z79891 Long term (current) use of opiate analgesic: Secondary | ICD-10-CM | POA: Diagnosis not present

## 2023-05-25 DIAGNOSIS — E039 Hypothyroidism, unspecified: Secondary | ICD-10-CM | POA: Diagnosis not present

## 2023-05-25 DIAGNOSIS — I272 Pulmonary hypertension, unspecified: Secondary | ICD-10-CM | POA: Diagnosis not present

## 2023-05-25 DIAGNOSIS — Z96642 Presence of left artificial hip joint: Secondary | ICD-10-CM | POA: Diagnosis not present

## 2023-05-25 DIAGNOSIS — Z85528 Personal history of other malignant neoplasm of kidney: Secondary | ICD-10-CM | POA: Diagnosis not present

## 2023-05-25 DIAGNOSIS — I9581 Postprocedural hypotension: Secondary | ICD-10-CM | POA: Diagnosis not present

## 2023-05-25 DIAGNOSIS — Z7984 Long term (current) use of oral hypoglycemic drugs: Secondary | ICD-10-CM | POA: Diagnosis not present

## 2023-05-27 DIAGNOSIS — I081 Rheumatic disorders of both mitral and tricuspid valves: Secondary | ICD-10-CM | POA: Diagnosis not present

## 2023-05-27 DIAGNOSIS — E039 Hypothyroidism, unspecified: Secondary | ICD-10-CM | POA: Diagnosis not present

## 2023-05-27 DIAGNOSIS — I130 Hypertensive heart and chronic kidney disease with heart failure and stage 1 through stage 4 chronic kidney disease, or unspecified chronic kidney disease: Secondary | ICD-10-CM | POA: Diagnosis not present

## 2023-05-27 DIAGNOSIS — Z96642 Presence of left artificial hip joint: Secondary | ICD-10-CM | POA: Diagnosis not present

## 2023-05-27 DIAGNOSIS — Z905 Acquired absence of kidney: Secondary | ICD-10-CM | POA: Diagnosis not present

## 2023-05-27 DIAGNOSIS — Z7984 Long term (current) use of oral hypoglycemic drugs: Secondary | ICD-10-CM | POA: Diagnosis not present

## 2023-05-27 DIAGNOSIS — I9581 Postprocedural hypotension: Secondary | ICD-10-CM | POA: Diagnosis not present

## 2023-05-27 DIAGNOSIS — I5042 Chronic combined systolic (congestive) and diastolic (congestive) heart failure: Secondary | ICD-10-CM | POA: Diagnosis not present

## 2023-05-27 DIAGNOSIS — Z8673 Personal history of transient ischemic attack (TIA), and cerebral infarction without residual deficits: Secondary | ICD-10-CM | POA: Diagnosis not present

## 2023-05-27 DIAGNOSIS — Z7982 Long term (current) use of aspirin: Secondary | ICD-10-CM | POA: Diagnosis not present

## 2023-05-27 DIAGNOSIS — Z79891 Long term (current) use of opiate analgesic: Secondary | ICD-10-CM | POA: Diagnosis not present

## 2023-05-27 DIAGNOSIS — Z471 Aftercare following joint replacement surgery: Secondary | ICD-10-CM | POA: Diagnosis not present

## 2023-05-27 DIAGNOSIS — Z604 Social exclusion and rejection: Secondary | ICD-10-CM | POA: Diagnosis not present

## 2023-05-27 DIAGNOSIS — Z7902 Long term (current) use of antithrombotics/antiplatelets: Secondary | ICD-10-CM | POA: Diagnosis not present

## 2023-05-27 DIAGNOSIS — N183 Chronic kidney disease, stage 3 unspecified: Secondary | ICD-10-CM | POA: Diagnosis not present

## 2023-05-27 DIAGNOSIS — I272 Pulmonary hypertension, unspecified: Secondary | ICD-10-CM | POA: Diagnosis not present

## 2023-05-27 DIAGNOSIS — Z85528 Personal history of other malignant neoplasm of kidney: Secondary | ICD-10-CM | POA: Diagnosis not present

## 2023-05-31 DIAGNOSIS — M1612 Unilateral primary osteoarthritis, left hip: Secondary | ICD-10-CM | POA: Diagnosis not present

## 2023-06-01 DIAGNOSIS — I5042 Chronic combined systolic (congestive) and diastolic (congestive) heart failure: Secondary | ICD-10-CM | POA: Diagnosis not present

## 2023-06-01 DIAGNOSIS — Z905 Acquired absence of kidney: Secondary | ICD-10-CM | POA: Diagnosis not present

## 2023-06-01 DIAGNOSIS — I081 Rheumatic disorders of both mitral and tricuspid valves: Secondary | ICD-10-CM | POA: Diagnosis not present

## 2023-06-01 DIAGNOSIS — I9581 Postprocedural hypotension: Secondary | ICD-10-CM | POA: Diagnosis not present

## 2023-06-01 DIAGNOSIS — I272 Pulmonary hypertension, unspecified: Secondary | ICD-10-CM | POA: Diagnosis not present

## 2023-06-01 DIAGNOSIS — Z85528 Personal history of other malignant neoplasm of kidney: Secondary | ICD-10-CM | POA: Diagnosis not present

## 2023-06-01 DIAGNOSIS — Z7902 Long term (current) use of antithrombotics/antiplatelets: Secondary | ICD-10-CM | POA: Diagnosis not present

## 2023-06-01 DIAGNOSIS — E039 Hypothyroidism, unspecified: Secondary | ICD-10-CM | POA: Diagnosis not present

## 2023-06-01 DIAGNOSIS — Z8673 Personal history of transient ischemic attack (TIA), and cerebral infarction without residual deficits: Secondary | ICD-10-CM | POA: Diagnosis not present

## 2023-06-01 DIAGNOSIS — N183 Chronic kidney disease, stage 3 unspecified: Secondary | ICD-10-CM | POA: Diagnosis not present

## 2023-06-01 DIAGNOSIS — Z96642 Presence of left artificial hip joint: Secondary | ICD-10-CM | POA: Diagnosis not present

## 2023-06-01 DIAGNOSIS — I130 Hypertensive heart and chronic kidney disease with heart failure and stage 1 through stage 4 chronic kidney disease, or unspecified chronic kidney disease: Secondary | ICD-10-CM | POA: Diagnosis not present

## 2023-06-01 DIAGNOSIS — Z7982 Long term (current) use of aspirin: Secondary | ICD-10-CM | POA: Diagnosis not present

## 2023-06-01 DIAGNOSIS — Z7984 Long term (current) use of oral hypoglycemic drugs: Secondary | ICD-10-CM | POA: Diagnosis not present

## 2023-06-01 DIAGNOSIS — Z79891 Long term (current) use of opiate analgesic: Secondary | ICD-10-CM | POA: Diagnosis not present

## 2023-06-01 DIAGNOSIS — Z604 Social exclusion and rejection: Secondary | ICD-10-CM | POA: Diagnosis not present

## 2023-06-01 DIAGNOSIS — Z471 Aftercare following joint replacement surgery: Secondary | ICD-10-CM | POA: Diagnosis not present

## 2023-06-08 ENCOUNTER — Other Ambulatory Visit: Payer: Self-pay

## 2023-06-08 ENCOUNTER — Ambulatory Visit: Payer: 59 | Attending: Orthopedic Surgery | Admitting: Physical Therapy

## 2023-06-08 ENCOUNTER — Encounter: Payer: Self-pay | Admitting: Physical Therapy

## 2023-06-08 ENCOUNTER — Other Ambulatory Visit (HOSPITAL_COMMUNITY): Payer: Self-pay | Admitting: Cardiology

## 2023-06-08 ENCOUNTER — Encounter (HOSPITAL_COMMUNITY): Payer: Self-pay

## 2023-06-08 DIAGNOSIS — R2689 Other abnormalities of gait and mobility: Secondary | ICD-10-CM | POA: Diagnosis not present

## 2023-06-08 DIAGNOSIS — M6281 Muscle weakness (generalized): Secondary | ICD-10-CM | POA: Insufficient documentation

## 2023-06-08 DIAGNOSIS — M25552 Pain in left hip: Secondary | ICD-10-CM | POA: Insufficient documentation

## 2023-06-08 MED ORDER — VYNDAMAX 61 MG PO CAPS
1.0000 | ORAL_CAPSULE | Freq: Every day | ORAL | 11 refills | Status: DC
  Filled 2023-06-08: qty 30, 30d supply, fill #0
  Filled 2023-07-05: qty 30, 30d supply, fill #1
  Filled 2023-08-09: qty 30, 30d supply, fill #2
  Filled 2023-08-31: qty 30, 30d supply, fill #3

## 2023-06-08 NOTE — Progress Notes (Signed)
Specialty Pharmacy Ongoing Clinical Assessment Note  Katie [name] is a 77 y.o. female who is being followed by the specialty pharmacy service for RxSp Cardiology   Patient's specialty medication(s) reviewed today: Tafamidis; Vutrisiran Sodium   Missed doses in the last 4 weeks: 0   Patient/Caregiver did not have any additional questions or concerns.   Therapeutic benefit summary: Patient is achieving benefit   Adverse events/side effects summary: No adverse events/side effects   Patient's therapy is appropriate to: Continue    Goals Addressed             This Visit's Progress    Stabilization of disease       Patient is on track. Patient will maintain adherence         Follow up:  6 months  Otto Herb Specialty Pharmacist

## 2023-06-08 NOTE — Progress Notes (Signed)
Specialty Pharmacy Refill Coordination Note  Katie [name] is a 77 y.o. female contacted today regarding refills of specialty medication(s) Vyndamax  Patient requested Delivery   Delivery date: 06/16/23   Verified address: [address], [place], [address]   Medication will be filled on 06/15/23.

## 2023-06-08 NOTE — Therapy (Signed)
OUTPATIENT PHYSICAL THERAPY LOWER EXTREMITY EVALUATION   Patient Name: Katie [name] MRN: [id number] DOB:10/21/1945, 77 y.o., female Today's Date: 06/08/2023  END OF SESSION:  PT End of Session - 06/08/23 1314     Visit Number 1    Number of Visits 24    Date for PT Re-Evaluation 08/31/23    Authorization Type [employer] MCR    Progress Note Due on Visit 10    PT Start Time 1316    PT Stop Time 1405    PT Time Calculation (min) 49 min    Activity Tolerance Patient tolerated treatment well    Behavior During Therapy WFL for tasks assessed/performed             Past Medical History:  Diagnosis Date   Amyloid heart disease (HCC)    Arthritis    Cancer (HCC)    kidney cancer   Chronic combined systolic and diastolic CHF (congestive heart failure) (HCC)    CKD (chronic kidney disease), stage III (HCC)    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 s/p right nephrectomy ~2007.   History of loop recorder    Hypercholesterolemia    Hypertension    Hypothyroidism    Mitral regurgitation    Pulmonary hypertension (HCC)    Stroke (HCC)    no residual   Past Surgical History:  Procedure Laterality Date   BREAST EXCISIONAL BIOPSY Left    BREAST EXCISIONAL BIOPSY Right    CARDIAC CATHETERIZATION N/A 01/12/2016   Procedure: Right/Left Heart Cath and Coronary/Graft Angiography;  Surgeon: Lyn Records, MD;  Location: [facility] INVASIVE CV LAB;  Service: Cardiovascular;  Laterality: N/A;   CERVICAL POLYPECTOMY N/A 04/23/2013   Procedure: CERVICAL POLYPECTOMY;  Surgeon: Dorien Chihuahua. Richardson Dopp, MD;  Location: [facility]S;  Service: Gynecology;  Laterality: N/A;  Possible Polypectomy   COLONOSCOPY WITH PROPOFOL N/A 12/19/2020   Procedure: COLONOSCOPY WITH PROPOFOL;  Surgeon: Jeani Hawking, MD;  Location: [facility] ENDOSCOPY;  Service: Endoscopy;  Laterality: N/A;   HYSTEROSCOPY WITH D & C N/A 04/23/2013   Procedure: DILATATION AND CURETTAGE /HYSTEROSCOPY;   Surgeon: Dorien Chihuahua. Richardson Dopp, MD;  Location: [facility];  Service: Gynecology;  Laterality: N/A;   IR ANGIO INTRA EXTRACRAN SEL COM CAROTID INNOMINATE BILAT MOD SED  02/11/2017   IR ANGIO VERTEBRAL SEL VERTEBRAL BILAT MOD SED  02/11/2017   KNEE ARTHROSCOPY Left    LOOP RECORDER INSERTION N/A 02/18/2017   Procedure: Loop Recorder Insertion;  Surgeon: Regan Lemming, MD;  Location: [facility] INVASIVE CV LAB;  Service: Cardiovascular;  Laterality: N/A;   MITRAL VALVE REPAIR  11/30/2017   NEPHRECTOMY Right    due to carcinoma   POLYPECTOMY  12/19/2020   Procedure: POLYPECTOMY;  Surgeon: Jeani Hawking, MD;  Location: [facility] ENDOS[facility];  Service: Endoscopy;;   TEE WITHOUT CARDIOVERSION N/A 02/18/2017   Procedure: TRANSESOPHAGEAL ECHOCARDIOGRAM (TEE) WITH LOOP;  Surgeon: Vesta Mixer, MD;  Location: [facility] ENDOSCOPY;  Service: Cardiovascular;  Laterality: N/A;   TOTAL HIP ARTHROPLASTY Left 05/16/2023   Procedure: TOTAL HIP ARTHROPLASTY ANTERIOR APPROACH;  Surgeon: Jodi Geralds, MD;  Location: [facility];  Service: Orthopedics;  Laterality: Left;   TRANSCATHETER MITRAL EDGE TO EDGE REPAIR N/A 11/30/2017   Procedure: MITRAL VALVE REPAIR;  Surgeon: Tonny Bollman, MD;  Location: [facility] INVASIVE CV LAB;  Service: Cardiovascular;  Laterality: N/A;   Patient Active Problem List   Diagnosis Date Noted  Hypotension 05/18/2023   Primary osteoarthritis of left hip 05/17/2023   H/O total hip arthroplasty, left 05/16/2023   Diverticular disease of colon 03/03/2021   Family history of malignant neoplasm of digestive organs 03/03/2021   Shortness of breath 03/03/2021   Systolic heart failure (HCC) 03/03/2021   Rhabdomyolysis 06/06/2020   Acute kidney injury superimposed on chronic kidney disease (HCC) 06/05/2020   Hypothyroidism    Acidosis    History of stroke 02/06/2018   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/2017   Severe mitral regurgitation 11/30/2017   Amyloid heart disease (HCC)     Myopericarditis 09/06/2017   Non-rheumatic mitral regurgitation    Chronic diastolic congestive heart failure (HCC) 05/08/2017   CHF exacerbation (HCC) 05/08/2017   Chronic migraine without aura without status migrainosus, not intractable    Hypertension 02/15/2017   Basilar artery occlusion    Cerebellar stroke (HCC) 02/10/2017   Headache 02/10/2017   Chest pain    Mitral regurgitation 01/08/2016   Moderate tricuspid regurgitation 01/08/2016   Pulmonary HTN (HCC) 01/08/2016   Acute CHF (congestive heart failure) (HCC) 01/07/2016   Elevated transaminase level 01/07/2016   Mild hypertension 01/07/2016   CKD (chronic kidney disease), stage III (HCC) 01/07/2016   Hyperlipidemia 01/07/2016   Acute CHF (HCC) 01/07/2016    PCP: Renford Dills, MD  REFERRING PROVIDER: Jodi Geralds, MD  REFERRING DIAG: S/P L THA Ant approach  THERAPY DIAG:  Pain in left hip - Plan: PT plan of care cert/re-cert  Muscle weakness (generalized) - Plan: PT plan of care cert/re-cert  Other abnormalities of gait and mobility - Plan: PT plan of care cert/re-cert  Rationale for Evaluation and Treatment: Rehabilitation  ONSET DATE: DOS 05/16/2023  SUBJECTIVE:   SUBJECTIVE STATEMENT: Since the surgery everything is going good. She reported having home health PT for 3 visits. She reports overall everything is going pretty good having occasional pain that occurs on its that goes up to a 5 /10. Currently uses a RW at home and previously before the surgery would occasionally use a SPC.   PERTINENT HISTORY: Kidney Cx, CHF, See above PAIN:  Are you having pain? Yes: NPRS scale: 0/10, at worst 5/10 in the last 24 hours Pain location: in the the front Pain description: sore aching Aggravating factors: unsure Relieving factors: take medication,   PRECAUTIONS: None  RED FLAGS: None   WEIGHT BEARING RESTRICTIONS: No  FALLS:  Has patient fallen in last 6 months? No  LIVING ENVIRONMENT: Lives with:  lives with their family and lives alone Lives in: House/apartment Stairs: Yes: External: 4 steps; on right going up Has following equipment at home: Single point cane, Environmental consultant - 2 wheeled, Marine scientist  OCCUPATION: Retired  PLOF: Independent with basic ADLs  PATIENT GOALS: To get back on my feet and walking.   OBJECTIVE:  Note: Objective measures were completed at Evaluation unless otherwise noted.  DIAGNOSTIC FINDINGS: see chart  PATIENT SURVEYS:  FOTO 79% predicted 76% ( this didn't align with her objective assessment plan to re-capture FOTO assessment on 2nd visit)  COGNITION: Overall cognitive status: Within functional limits for tasks assessed     SENSATION: Not tested   POSTURE: rounded shoulders and forward head  PALPATION: TTP along the surgical site/ proximal hip flexor.  Incision site covered by bandage.   LOWER EXTREMITY ROM:  Active ROM Right eval Left eval  Hip flexion 80 80  Hip extension 5 5  Hip abduction 22 supine 20 Supine  Hip adduction    Hip internal rotation    Hip external rotation    Knee flexion    Knee extension    Ankle dorsiflexion    Ankle plantarflexion    Ankle inversion    Ankle eversion     (Blank rows = not tested)  LOWER EXTREMITY MMT:  MMT Right eval Left eval  Hip flexion 3 3  Hip extension 3 3  Hip abduction Seated 3 seated3  Hip adduction Seated 4- Seated 4-  Hip internal rotation    Hip external rotation    Knee flexion 4 4  Knee extension 4 4  Ankle dorsiflexion    Ankle plantarflexion    Ankle inversion    Ankle eversion     (Blank rows = not tested)  FUNCTIONAL TESTS:  5 times sit to stand: 20 seconds with significant use of hands pushing up from chair.  Timed up and go (TUG): 31 seconds   GAIT: Distance walked: 85 ft from waiting area to treatment room  Assistive device utilized: Walker - 2 wheeled Level of assistance: Modified independence Comments: decreased stride, flexed  trunk   TODAY'S                                                                                                                             [facility] Adult PT Treatment:                                                DATE: 06/08/2023 Therapeutic Exercise: Hooklying clamshell 2 x 10 with RTB Bridge 2 x 10 SKTC 1 x 30 L Supine marching 1 x 20 alternating L/R  Provided initial HEP  PATIENT EDUCATION:  Education details: evaluation findings, POC, goals, HEP with proper form/ rationale.  Person educated: Patient Education method: Explanation, Verbal cues, and Handouts Education comprehension: verbalized understanding  HOME EXERCISE PROGRAM: Access Code: [email or web address] URL: [email or web address] Date: 06/08/2023 Prepared by: Lulu Riding  Exercises - Supine Bridge  - 1 x daily - 7 x weekly - 2 sets - 10 reps - Hooklying Clamshell with Resistance  - 1 x daily - 7 x weekly - 2 sets - 15 reps - Supine March  - 1 x daily - 7 x weekly - 2 sets - 10 reps - 5 hold - Seated Gluteal Sets  - 1 x daily - 7 x weekly - 2 sets - 10 reps - Hooklying Single Knee to Chest Stretch  - 1 x daily - 7 x weekly - 2 sets - 2 reps - 30 seconds hold  ASSESSMENT:  CLINICAL IMPRESSION: Patient is a 77 y.o. F who was seen today for physical therapy evaluation and treatment for s/p L THA on 05/16/23.Patient exhibits limited hip flexion/ abduction AROM secondary to weakness, and limited hip extension secondary to anterior stiffness. MMT  reveals gross L hip weakness, pt was unable to perform sit to stand without UE assist and completed 5 x STS using bil UE in 20 seconds and a TUG with RW at 31 seconds indicating higher risk of falling. Patient would benefit from physical therapy to promote hip mobility, increase strength and stability, maximize her function by addressing the deficits listed.   OBJECTIVE IMPAIRMENTS: decreased activity tolerance, decreased balance, decreased endurance, decreased ROM, decreased  strength, impaired flexibility, postural dysfunction, and pain.   ACTIVITY LIMITATIONS: carrying, lifting, standing, squatting, stairs, and locomotion level  PARTICIPATION LIMITATIONS: shopping and community activity  PERSONAL FACTORS: Age and 1-2 comorbidities: CX, CHF  are also affecting patient's functional outcome.   REHAB POTENTIAL: Good  CLINICAL DECISION MAKING: Evolving/moderate complexity  EVALUATION COMPLEXITY: Moderate   GOALS: Goals reviewed with patient? Yes  SHORT TERM GOALS: Target date: 07/20/2023   Pt to be IND with initial HEP for therapeutic progression Baseline: no previous HEP Goal status: INITIAL  2.  Pt to increase L hip flexor/ abductor strength to 3+/5 to promote hip stability  Baseline: see flow sheet Goal status: INITIAL  3.  Increase L hip flexion to >/= 90 degrees  Baseline: see flowsheet Goal status: INITIAL  4.  Pt to be able to perform 5 x STS with </= 20 seconds with minimal to no UE assistance Baseline: 20 seconds with significant UE use Goal status: INITIAL  LONG TERM GOALS: Target date: 08/31/2023   Improve L gross hip strength to >/= 4/5 to promote stability and safety with walking/ standing  Baseline: see flow sheet Goal status: INITIAL  2.  Pt to improve TUG to </= 18 seconds with LRAD to demonstrate improvement in mobility and function Baseline: initial score 31 seconds with 31 seconds Goal status: INITIAL  3.  Pt to be able to walk/ standing with LRAD for >/= 45 min for endurance required ADLS and community ambulation  Baseline:  Goal status: INITIAL  4.  Pt to be able to perform 5 x STS in </= 15 seconds with no UE use for improvement in function. Baseline:  Goal status: INITIAL  5.  Pt to be IND with all HEP and is able to maintain and progress her current LOF IND.  Baseline:  Goal status: INITIAL  PLAN:  PT FREQUENCY: 1-2x/week  PT DURATION: 12 weeks  PLANNED INTERVENTIONS: 97164- PT Re-evaluation,  97110-Therapeutic exercises, 97530- Therapeutic activity, [id number]- Neuromuscular re-education, 97535- Self Care, [id number]- Manual therapy, [id number]- Gait training, [phone]- Aquatic Therapy, Stair training, Dry Needling, Joint mobilization, Cryotherapy, and Moist heat  PLAN FOR NEXT SESSION: Review/ update HEP PRN. Reassess FOTO again on 2nd visit    Lulu Riding PT, DPT, LAT, ATC  06/08/23  5:08 PM  Date of referral: 05/31/2023 Referring provider: Jodi Geralds Referring diagnosis? S/p L THA Treatment diagnosis? (if different than referring diagnosis) Pain in left hip  Muscle weakness (generalized)  Other abnormalities of gait and mobility  What was this (referring dx) caused by? Surgery (Type: L total hip)  Ashby Dawes of Condition: Initial Onset (within last 3 months)   Laterality: Lt  Current Functional Measure Score: FOTO 76% TUG 31 seconds  Objective measurements identify impairments when they are compared to normal values, the uninvolved extremity, and prior level of function.  [x]  Yes  []  No  Objective assessment of functional ability: Minimal functional limitations   Briefly describe symptoms:  L hip pain and weakness  How did symptoms start: surgery  Average pain intensity:  Last  24 hours: 5/10  Past week: 5/10  How often does the pt experience symptoms? Intermittently  How much have the symptoms interfered with usual daily activities? A little bit  How has condition changed since care began at this facility? A little better  In general, how is the patients overall health? Very Good   BACK PAIN (STarT Back Screening Tool) No

## 2023-06-09 ENCOUNTER — Other Ambulatory Visit: Payer: Self-pay

## 2023-06-09 ENCOUNTER — Other Ambulatory Visit (HOSPITAL_COMMUNITY): Payer: Self-pay

## 2023-06-09 ENCOUNTER — Other Ambulatory Visit (HOSPITAL_COMMUNITY): Payer: Self-pay | Admitting: Cardiology

## 2023-06-09 MED ORDER — AMVUTTRA 25 MG/0.5ML ~~LOC~~ SOSY
25.0000 mg | PREFILLED_SYRINGE | SUBCUTANEOUS | 3 refills | Status: DC
  Filled 2023-06-09: qty 0.5, 90d supply, fill #0
  Filled 2023-09-22: qty 0.5, 90d supply, fill #1
  Filled 2024-01-02: qty 0.5, 90d supply, fill #2
  Filled 2024-03-29: qty 0.5, 90d supply, fill #3

## 2023-06-09 NOTE — Progress Notes (Signed)
Specialty Pharmacy Refill Coordination Note  Katie [name] is a 77 y.o. female contacted today regarding refills of specialty medication(s) Vutrisiran Sodium   Patient requested Courier to Provider Office   Delivery date: 07/08/23   Verified address: [facility] [address] [place], [place] [address]   Medication will be filled on 07/07/23.

## 2023-06-13 ENCOUNTER — Other Ambulatory Visit: Payer: Self-pay

## 2023-06-13 NOTE — Therapy (Signed)
OUTPATIENT PHYSICAL THERAPY LOWER EXTREMITY TREATMENT   Patient Name: Katie [name] MRN: [id number] DOB:[birth year], 77 y.o., female Today's Date: 06/15/2023  END OF SESSION:  PT End of Session - 06/15/23 0811     Visit Number 2    Number of Visits 24    Date for PT Re-Evaluation 08/31/23    Authorization Type [employer] MCR    Progress Note Due on Visit 10    PT Start Time 0800    PT Stop Time 0845    PT Time Calculation (min) 45 min    Activity Tolerance Patient tolerated treatment well    Behavior During Therapy WFL for tasks assessed/performed              Past Medical History:  Diagnosis Date   Amyloid heart disease (HCC)    Arthritis    Cancer (HCC)    kidney cancer   Chronic combined systolic and diastolic CHF (congestive heart failure) (HCC)    CKD (chronic kidney disease), stage III (HCC)    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 s/p right nephrectomy ~2007.   History of loop recorder    Hypercholesterolemia    Hypertension    Hypothyroidism    Mitral regurgitation    Pulmonary hypertension (HCC)    Stroke (HCC)    no residual   Past Surgical History:  Procedure Laterality Date   BREAST EXCISIONAL BIOPSY Left    BREAST EXCISIONAL BIOPSY Right    CARDIAC CATHETERIZATION N/A 01/12/2016   Procedure: Right/Left Heart Cath and Coronary/Graft Angiography;  Surgeon: Lyn Records, MD;  Location: [facility] INVASIVE CV LAB;  Service: Cardiovascular;  Laterality: N/A;   CERVICAL POLYPECTOMY N/A 04/23/2013   Procedure: CERVICAL POLYPECTOMY;  Surgeon: Dorien Chihuahua. Richardson Dopp, MD;  Location: [facility];  Service: Gynecology;  Laterality: N/A;  Possible Polypectomy   COLONOSCOPY WITH PROPOFOL N/A 12/19/2020   Procedure: COLONOSCOPY WITH PROPOFOL;  Surgeon: Jeani Hawking, MD;  Location: [facility];  Service: Endoscopy;  Laterality: N/A;   HYSTEROSCOPY WITH D & C N/A 04/23/2013   Procedure: DILATATION AND CURETTAGE /HYSTEROSCOPY;   Surgeon: Dorien Chihuahua. Richardson Dopp, MD;  Location: [facility];  Service: Gynecology;  Laterality: N/A;   IR ANGIO INTRA EXTRACRAN SEL COM CAROTID INNOMINATE BILAT MOD SED  02/11/2017   IR ANGIO VERTEBRAL SEL VERTEBRAL BILAT MOD SED  02/11/2017   KNEE ARTHROSCOPY Left    LOOP RECORDER INSERTION N/A 02/18/2017   Procedure: Loop Recorder Insertion;  Surgeon: Regan Lemming, MD;  Location: [facility] INVASIVE CV LAB;  Service: Cardiovascular;  Laterality: N/A;   MITRAL VALVE REPAIR  11/30/2017   NEPHRECTOMY Right    due to carcinoma   POLYPECTOMY  12/19/2020   Procedure: POLYPECTOMY;  Surgeon: Jeani Hawking, MD;  Location: [facility];  Service: Endoscopy;;   TEE WITHOUT CARDIOVERSION N/A 02/18/2017   Procedure: TRANSESOPHAGEAL ECHOCARDIOGRAM (TEE) WITH LOOP;  Surgeon: Vesta Mixer, MD;  Location: [facility] ENDOSCOPY;  Service: Cardiovascular;  Laterality: N/A;   TOTAL HIP ARTHROPLASTY Left 05/16/2023   Procedure: TOTAL HIP ARTHROPLASTY ANTERIOR APPROACH;  Surgeon: Jodi Geralds, MD;  Location: [facility];  Service: Orthopedics;  Laterality: Left;   TRANSCATHETER MITRAL EDGE TO EDGE REPAIR N/A 11/30/2017   Procedure: MITRAL VALVE REPAIR;  Surgeon: Tonny Bollman, MD;  Location: [facility] INVASIVE CV LAB;  Service: Cardiovascular;  Laterality: N/A;   Patient Active Problem List   Diagnosis Date Noted  Hypotension 05/18/2023   Primary osteoarthritis of left hip 05/17/2023   H/O total hip arthroplasty, left 05/16/2023   Diverticular disease of colon 03/03/2021   Family history of malignant neoplasm of digestive organs 03/03/2021   Shortness of breath 03/03/2021   Systolic heart failure (HCC) 03/03/2021   Rhabdomyolysis 06/06/2020   Acute kidney injury superimposed on chronic kidney disease (HCC) 06/05/2020   Hypothyroidism    Acidosis    History of stroke 02/06/2018   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/2017   Severe mitral regurgitation 11/30/2017   Amyloid heart disease (HCC)     Myopericarditis 09/06/2017   Non-rheumatic mitral regurgitation    Chronic diastolic congestive heart failure (HCC) 05/08/2017   CHF exacerbation (HCC) 05/08/2017   Chronic migraine without aura without status migrainosus, not intractable    Hypertension 02/15/2017   Basilar artery occlusion    Cerebellar stroke (HCC) 02/10/2017   Headache 02/10/2017   Chest pain    Mitral regurgitation 01/08/2016   Moderate tricuspid regurgitation 01/08/2016   Pulmonary HTN (HCC) 01/08/2016   Acute CHF (congestive heart failure) (HCC) 01/07/2016   Elevated transaminase level 01/07/2016   Mild hypertension 01/07/2016   CKD (chronic kidney disease), stage III (HCC) 01/07/2016   Hyperlipidemia 01/07/2016   Acute CHF (HCC) 01/07/2016    PCP: Renford Dills, MD  REFERRING PROVIDER: Jodi Geralds, MD  REFERRING DIAG: S/P L THA Ant approach  THERAPY DIAG:  Pain in left hip  Muscle weakness (generalized)  Other abnormalities of gait and mobility  Rationale for Evaluation and Treatment: Rehabilitation  ONSET DATE: DOS 05/16/2023  SUBJECTIVE:   SUBJECTIVE STATEMENT: Pt reports she is not experiencing any l hip pain. Pt reports completing her HEP daily.  EVAL: Since the surgery everything is going good. She reported having home health PT for 3 visits. She reports overall everything is going pretty good having occasional pain that occurs on its that goes up to a 5 /10. Currently uses a RW at home and previously before the surgery would occasionally use a SPC.   PERTINENT HISTORY: Kidney Cx, CHF, See above PAIN:  Are you having pain? Yes: NPRS scale: 0/10, at worst 5/10 in the last 24 hours Pain location: in the the front Pain description: sore aching Aggravating factors: unsure Relieving factors: take medication,   PRECAUTIONS: None  RED FLAGS: None   WEIGHT BEARING RESTRICTIONS: No  FALLS:  Has patient fallen in last 6 months? No  LIVING ENVIRONMENT: Lives with: lives with  their family and lives alone Lives in: House/apartment Stairs: Yes: External: 4 steps; on right going up Has following equipment at home: Single point cane, Environmental consultant - 2 wheeled, Marine scientist  OCCUPATION: Retired  PLOF: Independent with basic ADLs  PATIENT GOALS: To get back on my feet and walking.   OBJECTIVE:  Note: Objective measures were completed at Evaluation unless otherwise noted.  DIAGNOSTIC FINDINGS: see chart  PATIENT SURVEYS:  FOTO 79% predicted 76% ( this didn't align with her objective assessment plan to re-capture FOTO assessment on 2nd visit) 06/15/23: 53%, predicted 66%  COGNITION: Overall cognitive status: Within functional limits for tasks assessed     SENSATION: Not tested   POSTURE: rounded shoulders and forward head  PALPATION: TTP along the surgical site/ proximal hip flexor.  Incision site covered by bandage.   LOWER EXTREMITY ROM:  Active ROM Right eval Left eval  Hip flexion 80 80  Hip extension 5 5  Hip abduction 22 supine 20  Supine  Hip adduction    Hip internal rotation    Hip external rotation    Knee flexion    Knee extension    Ankle dorsiflexion    Ankle plantarflexion    Ankle inversion    Ankle eversion     (Blank rows = not tested)  LOWER EXTREMITY MMT:  MMT Right eval Left eval  Hip flexion 3 3  Hip extension 3 3  Hip abduction Seated 3 seated3  Hip adduction Seated 4- Seated 4-  Hip internal rotation    Hip external rotation    Knee flexion 4 4  Knee extension 4 4  Ankle dorsiflexion    Ankle plantarflexion    Ankle inversion    Ankle eversion     (Blank rows = not tested)  FUNCTIONAL TESTS:  5 times sit to stand: 20 seconds with significant use of hands pushing up from chair.  Timed up and go (TUG): 31 seconds   GAIT: Distance walked: 85 ft from waiting area to treatment room  Assistive device utilized: Walker - 2 wheeled Level of assistance: Modified independence Comments: decreased stride,  flexed trunk   TODAY'S    [facility] Adult PT Treatment:                                                DATE: 06/15/23 Therapeutic Exercise: SLR c QS x10. Quad lag not present Supine Bridge 15 reps Hooklying Clamshell c BluTB 15 reps Supine March 10 reps  Seated Gluteal Sets 15 reps LAQ L x15 3" Hooklying Single Knee to Chest 2 reps - 30 seconds hold Updated HEP Therapeutic Activity: Gait training in // bars to mimic use of SPC c 1 bar using R hand; then c a SPC c SBA was provided for safety due to L LE weakness. Kandis Mannan                                                                                                                       [facility] Adult PT Treatment:                                                DATE: 06/08/2023 Therapeutic Exercise: Hooklying clamshell 2 x 10 with RTB Bridge 2 x 10 SKTC 1 x 30 L Supine marching 1 x 20 alternating L/R  Provided initial HEP  PATIENT EDUCATION:  Education details: evaluation findings, POC, goals, HEP with proper form/ rationale.  Person educated: Patient Education method: Explanation, Verbal cues, and Handouts Education comprehension: verbalized understanding  HOME EXERCISE PROGRAM: Access Code: [email or web address] URL: [email or web address] Date: 06/15/2023 Prepared by: Joellyn Rued  Exercises - Supine Bridge  - 1 x daily - 7 x weekly - 2 sets -  10 reps - Hooklying Clamshell with Resistance  - 1 x daily - 7 x weekly - 2 sets - 15 reps - Supine March  - 1 x daily - 7 x weekly - 2 sets - 10 reps - 5 hold - Seated Gluteal Sets  - 1 x daily - 7 x weekly - 2 sets - 10 reps - Hooklying Single Knee to Chest Stretch  - 1 x daily - 7 x weekly - 2 sets - 2 reps - 30 seconds hold - Active Straight Leg Raise with Quad Set  - 1 x daily - 7 x weekly - 2 sets - 10 reps - 3 hold - Seated Long Arc Quad  - 1 x daily - 7 x weekly - 2 sets - 10 reps - 3 hold  ASSESSMENT:  CLINICAL IMPRESSION: Pt primarily use a RW for assist c walking, but  does use a SPC going to the bathroom due to narrow walking pathway. Completed gait training c SPC in R hand. Pt demonstrated proper technique. Gait deficit is present related to L LE weakness. For walking in narrow pathways c SPC, pt was advised to use her off hand on furniture or walls for assist for additional safety. Pt then completed therex for L LE strengthening with mod verbal cueing for proper technique. HEP was updated with 2 additional exs. Pt tolerated PT today without adverse effects. Pt will continue to benefit from skilled PT to address impairments for improved function.   EVAL: Patient is a 77 y.o. F who was seen today for physical therapy evaluation and treatment for s/p L THA on 05/16/23.Patient exhibits limited hip flexion/ abduction AROM secondary to weakness, and limited hip extension secondary to anterior stiffness. MMT reveals gross L hip weakness, pt was unable to perform sit to stand without UE assist and completed 5 x STS using bil UE in 20 seconds and a TUG with RW at 31 seconds indicating higher risk of falling. Patient would benefit from physical therapy to promote hip mobility, increase strength and stability, maximize her function by addressing the deficits listed.   OBJECTIVE IMPAIRMENTS: decreased activity tolerance, decreased balance, decreased endurance, decreased ROM, decreased strength, impaired flexibility, postural dysfunction, and pain.   ACTIVITY LIMITATIONS: carrying, lifting, standing, squatting, stairs, and locomotion level  PARTICIPATION LIMITATIONS: shopping and community activity  PERSONAL FACTORS: Age and 1-2 comorbidities: CX, CHF  are also affecting patient's functional outcome.   REHAB POTENTIAL: Good  CLINICAL DECISION MAKING: Evolving/moderate complexity  EVALUATION COMPLEXITY: Moderate   GOALS: Goals reviewed with patient? Yes  SHORT TERM GOALS: Target date: 07/20/2023   Pt to be IND with initial HEP for therapeutic progression Baseline:  no previous HEP Goal status: INITIAL  2.  Pt to increase L hip flexor/ abductor strength to 3+/5 to promote hip stability  Baseline: see flow sheet Goal status: INITIAL  3.  Increase L hip flexion to >/= 90 degrees  Baseline: see flowsheet Goal status: INITIAL  4.  Pt to be able to perform 5 x STS with </= 20 seconds with minimal to no UE assistance Baseline: 20 seconds with significant UE use Goal status: INITIAL  LONG TERM GOALS: Target date: 08/31/2023   Improve L gross hip strength to >/= 4/5 to promote stability and safety with walking/ standing  Baseline: see flow sheet Goal status: INITIAL  2.  Pt to improve TUG to </= 18 seconds with LRAD to demonstrate improvement in mobility and function Baseline: initial score 31 seconds with 31  seconds Goal status: INITIAL  3.  Pt to be able to walk/ standing with LRAD for >/= 45 min for endurance required ADLS and community ambulation  Baseline:  Goal status: INITIAL  4.  Pt to be able to perform 5 x STS in </= 15 seconds with no UE use for improvement in function. Baseline:  Goal status: INITIAL  5.  Pt to be IND with all HEP and is able to maintain and progress her current LOF IND.  Baseline:  Goal status: INITIAL  PLAN:  PT FREQUENCY: 1-2x/week  PT DURATION: 12 weeks  PLANNED INTERVENTIONS: 97164- PT Re-evaluation, 97110-Therapeutic exercises, 97530- Therapeutic activity, [id number]- Neuromuscular re-education, 97535- Self Care, [id number]- Manual therapy, [id number]- Gait training, [phone]- Aquatic Therapy, Stair training, Dry Needling, Joint mobilization, Cryotherapy, and Moist heat  PLAN FOR NEXT SESSION: Review/ update HEP PRN. Reassess FOTO again on 2nd visit    Joellyn Rued MS, PT 06/15/23 9:03 AM

## 2023-06-15 ENCOUNTER — Ambulatory Visit: Payer: 59

## 2023-06-15 ENCOUNTER — Other Ambulatory Visit: Payer: Self-pay

## 2023-06-15 DIAGNOSIS — R2689 Other abnormalities of gait and mobility: Secondary | ICD-10-CM

## 2023-06-15 DIAGNOSIS — M6281 Muscle weakness (generalized): Secondary | ICD-10-CM | POA: Diagnosis not present

## 2023-06-15 DIAGNOSIS — M25552 Pain in left hip: Secondary | ICD-10-CM

## 2023-06-16 NOTE — Therapy (Signed)
OUTPATIENT PHYSICAL THERAPY LOWER EXTREMITY TREATMENT   Patient Name: Katie [name] MRN: [id number] DOB:09-04-1945, 77 y.o., female Today's Date: 06/17/2023  END OF SESSION:  PT End of Session - 06/17/23 1245     Visit Number 3    Number of Visits 24    Date for PT Re-Evaluation 08/31/23    Authorization Type [employer] MCR    PT Start Time 1245    PT Stop Time 1330    PT Time Calculation (min) 45 min    Activity Tolerance Patient tolerated treatment well    Behavior During Therapy WFL for tasks assessed/performed               Past Medical History:  Diagnosis Date   Amyloid heart disease (HCC)    Arthritis    Cancer (HCC)    kidney cancer   Chronic combined systolic and diastolic CHF (congestive heart failure) (HCC)    CKD (chronic kidney disease), stage III (HCC)    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 s/p right nephrectomy ~2007.   History of loop recorder    Hypercholesterolemia    Hypertension    Hypothyroidism    Mitral regurgitation    Pulmonary hypertension (HCC)    Stroke (HCC)    no residual   Past Surgical History:  Procedure Laterality Date   BREAST EXCISIONAL BIOPSY Left    BREAST EXCISIONAL BIOPSY Right    CARDIAC CATHETERIZATION N/A 01/12/2016   Procedure: Right/Left Heart Cath and Coronary/Graft Angiography;  Surgeon: Lyn Records, MD;  Location: [facility] INVASIVE CV LAB;  Service: Cardiovascular;  Laterality: N/A;   CERVICAL POLYPECTOMY N/A 04/23/2013   Procedure: CERVICAL POLYPECTOMY;  Surgeon: Dorien Chihuahua. Richardson Dopp, MD;  Location: [facility] ORS;  Service: Gynecology;  Laterality: N/A;  Possible Polypectomy   COLONOSCOPY WITH PROPOFOL N/A 12/19/2020   Procedure: COLONOSCOPY WITH PROPOFOL;  Surgeon: Jeani Hawking, MD;  Location: [facility] ENDOSCOPY;  Service: Endoscopy;  Laterality: N/A;   HYSTEROSCOPY WITH D & C N/A 04/23/2013   Procedure: DILATATION AND CURETTAGE /HYSTEROSCOPY;  Surgeon: Dorien Chihuahua. Richardson Dopp, MD;  Location:  [facility];  Service: Gynecology;  Laterality: N/A;   IR ANGIO INTRA EXTRACRAN SEL COM CAROTID INNOMINATE BILAT MOD SED  02/11/2017   IR ANGIO VERTEBRAL SEL VERTEBRAL BILAT MOD SED  02/11/2017   KNEE ARTHROSCOPY Left    LOOP RECORDER INSERTION N/A 02/18/2017   Procedure: Loop Recorder Insertion;  Surgeon: Regan Lemming, MD;  Location: [facility] INVASIVE CV LAB;  Service: Cardiovascular;  Laterality: N/A;   MITRAL VALVE REPAIR  11/30/2017   NEPHRECTOMY Right    due to carcinoma   POLYPECTOMY  12/19/2020   Procedure: POLYPECTOMY;  Surgeon: Jeani Hawking, MD;  Location: [facility]OS[facility];  Service: Endoscopy;;   TEE WITHOUT CARDIOVERSION N/A 02/18/2017   Procedure: TRANSESOPHAGEAL ECHOCARDIOGRAM (TEE) WITH LOOP;  Surgeon: Vesta Mixer, MD;  Location: [facility] ENDOSCOPY;  Service: Cardiovascular;  Laterality: N/A;   TOTAL HIP ARTHROPLASTY Left 05/16/2023   Procedure: TOTAL HIP ARTHROPLASTY ANTERIOR APPROACH;  Surgeon: Jodi Geralds, MD;  Location: [facility];  Service: Orthopedics;  Laterality: Left;   TRANSCATHETER MITRAL EDGE TO EDGE REPAIR N/A 11/30/2017   Procedure: MITRAL VALVE REPAIR;  Surgeon: Tonny Bollman, MD;  Location: [facility] INVASIVE CV LAB;  Service: Cardiovascular;  Laterality: N/A;   Patient Active Problem List   Diagnosis Date Noted   Hypotension 05/18/2023   Primary osteoarthritis  of left hip 05/17/2023   H/O total hip arthroplasty, left 05/16/2023   Diverticular disease of colon 03/03/2021   Family history of malignant neoplasm of digestive organs 03/03/2021   Shortness of breath 03/03/2021   Systolic heart failure (HCC) 03/03/2021   Rhabdomyolysis 06/06/2020   Acute kidney injury superimposed on chronic kidney disease (HCC) 06/05/2020   Hypothyroidism    Acidosis    History of stroke 02/06/2018   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/2017   Severe mitral regurgitation 11/30/2017   Amyloid heart disease (HCC)    Myopericarditis 09/06/2017    Non-rheumatic mitral regurgitation    Chronic diastolic congestive heart failure (HCC) 05/08/2017   CHF exacerbation (HCC) 05/08/2017   Chronic migraine without aura without status migrainosus, not intractable    Hypertension 02/15/2017   Basilar artery occlusion    Cerebellar stroke (HCC) 02/10/2017   Headache 02/10/2017   Chest pain    Mitral regurgitation 01/08/2016   Moderate tricuspid regurgitation 01/08/2016   Pulmonary HTN (HCC) 01/08/2016   Acute CHF (congestive heart failure) (HCC) 01/07/2016   Elevated transaminase level 01/07/2016   Mild hypertension 01/07/2016   CKD (chronic kidney disease), stage III (HCC) 01/07/2016   Hyperlipidemia 01/07/2016   Acute CHF (HCC) 01/07/2016    PCP: Renford Dills, MD  REFERRING PROVIDER: Jodi Geralds, MD  REFERRING DIAG: S/P L THA Ant approach  THERAPY DIAG:  Pain in left hip  Muscle weakness (generalized)  Other abnormalities of gait and mobility  Rationale for Evaluation and Treatment: Rehabilitation  ONSET DATE: DOS 05/16/2023  SUBJECTIVE:   SUBJECTIVE STATEMENT: Pt reports she is doing well. She is not experiencing L hip pain.  EVAL: Since the surgery everything is going good. She reported having home health PT for 3 visits. She reports overall everything is going pretty good having occasional pain that occurs on its that goes up to a 5 /10. Currently uses a RW at home and previously before the surgery would occasionally use a SPC.   PERTINENT HISTORY: Kidney Cx, CHF, See above PAIN:  Are you having pain? Yes: NPRS scale: 0/10, at worst 0/10 in the last 24 hours Pain location: in the the front Pain description: sore aching Aggravating factors: unsure Relieving factors: take medication,   PRECAUTIONS: None  RED FLAGS: None   WEIGHT BEARING RESTRICTIONS: No  FALLS:  Has patient fallen in last 6 months? No  LIVING ENVIRONMENT: Lives with: lives with their family and lives alone Lives in:  House/apartment Stairs: Yes: External: 4 steps; on right going up Has following equipment at home: Single point cane, Environmental consultant - 2 wheeled, Marine scientist  OCCUPATION: Retired  PLOF: Independent with basic ADLs  PATIENT GOALS: To get back on my feet and walking.   OBJECTIVE:  Note: Objective measures were completed at Evaluation unless otherwise noted.  DIAGNOSTIC FINDINGS: see chart  PATIENT SURVEYS:  FOTO 79% predicted 76% ( this didn't align with her objective assessment plan to re-capture FOTO assessment on 2nd visit) 06/15/23: 53%, predicted 66%  COGNITION: Overall cognitive status: Within functional limits for tasks assessed     SENSATION: Not tested   POSTURE: rounded shoulders and forward head  PALPATION: TTP along the surgical site/ proximal hip flexor.  Incision site covered by bandage.   LOWER EXTREMITY ROM:  Active ROM Right eval Left eval  Hip flexion 80 80  Hip extension 5 5  Hip abduction 22 supine 20 Supine  Hip adduction    Hip internal  rotation    Hip external rotation    Knee flexion    Knee extension    Ankle dorsiflexion    Ankle plantarflexion    Ankle inversion    Ankle eversion     (Blank rows = not tested)  LOWER EXTREMITY MMT:  MMT Right eval Left eval  Hip flexion 3 3  Hip extension 3 3  Hip abduction Seated 3 seated3  Hip adduction Seated 4- Seated 4-  Hip internal rotation    Hip external rotation    Knee flexion 4 4  Knee extension 4 4  Ankle dorsiflexion    Ankle plantarflexion    Ankle inversion    Ankle eversion     (Blank rows = not tested)  FUNCTIONAL TESTS:  5 times sit to stand: 20 seconds with significant use of hands pushing up from chair.  Timed up and go (TUG): 31 seconds   GAIT: Distance walked: 85 ft from waiting area to treatment room  Assistive device utilized: Walker - 2 wheeled Level of assistance: Modified independence Comments: decreased stride, flexed trunk   [facility]  Adult PT Treatment:                                                DATE: 06/17/23 Therapeutic Exercise: Nustep 5 min L5 UE/LE LAQ 2x10 3# STS c use of hands 5x Hooklying Single Knee to Chest 2 reps - 30 seconds hold SLR c QS 2x10. Quad lag not present Supine Bridge 2x10 reps Hooklying Clamshell c BluTB 2x15 reps Supine March 15 reps  Standing hip abd 2x10 each Heel raise/toe lifts 2x10 Therapeutic Activity: Gait training for ambulation with c SPC 90' x 2 and SBA for safety  [facility] Adult PT Treatment:                                                DATE: 06/15/23 Therapeutic Exercise: SLR c QS x10. Quad lag not present Supine Bridge 15 reps Hooklying Clamshell c BluTB 15 reps Supine March 10 reps  Seated Gluteal Sets 15 reps LAQ L x15 3" Hooklying Single Knee to Chest 2 reps - 30 seconds hold Updated HEP Therapeutic Activity: Gait training in // bars to mimic use of SPC c 1 bar using R hand; then c a SPC c SBA was provided for safety due to L LE weakness. Reassess FOTO                                                                                                                       [facility] Adult PT Treatment:  DATE: 06/08/2023 Therapeutic Exercise: Hooklying clamshell 2 x 10 with RTB Bridge 2 x 10 SKTC 1 x 30 L Supine marching 1 x 20 alternating L/R  Provided initial HEP  PATIENT EDUCATION:  Education details: evaluation findings, POC, goals, HEP with proper form/ rationale.  Person educated: Patient Education method: Explanation, Verbal cues, and Handouts Education comprehension: verbalized understanding  HOME EXERCISE PROGRAM: Access Code: [email or web address] URL: [email or web address] Date: 06/15/2023 Prepared by: Joellyn Rued  Exercises - Supine Bridge  - 1 x daily - 7 x weekly - 2 sets - 10 reps - Hooklying Clamshell with Resistance  - 1 x daily - 7 x weekly - 2 sets - 15 reps - Supine March  - 1 x daily - 7 x weekly  - 2 sets - 10 reps - 5 hold - Seated Gluteal Sets  - 1 x daily - 7 x weekly - 2 sets - 10 reps - Hooklying Single Knee to Chest Stretch  - 1 x daily - 7 x weekly - 2 sets - 2 reps - 30 seconds hold - Active Straight Leg Raise with Quad Set  - 1 x daily - 7 x weekly - 2 sets - 10 reps - 3 hold - Seated Long Arc Quad  - 1 x daily - 7 x weekly - 2 sets - 10 reps - 3 hold  ASSESSMENT:  CLINICAL IMPRESSION: Completed gait training c SPC and SBA for safety. Pt used the [facility] properly. SBA was provided for safety with decreased LE steadiness in stance observed and pt reporting L LE fatigue near the end of each walk. Therex was then completed or LE strengthening. Min verbal cueing was provided for most proper technique c therex. Pt tolerated PT today without adverse effects. Pt will continue to benefit from skilled PT to address impairments for improved L hip/LE function.   EVAL: Patient is a 77 y.o. F who was seen today for physical therapy evaluation and treatment for s/p L THA on 05/16/23.Patient exhibits limited hip flexion/ abduction AROM secondary to weakness, and limited hip extension secondary to anterior stiffness. MMT reveals gross L hip weakness, pt was unable to perform sit to stand without UE assist and completed 5 x STS using bil UE in 20 seconds and a TUG with RW at 31 seconds indicating higher risk of falling. Patient would benefit from physical therapy to promote hip mobility, increase strength and stability, maximize her function by addressing the deficits listed.   OBJECTIVE IMPAIRMENTS: decreased activity tolerance, decreased balance, decreased endurance, decreased ROM, decreased strength, impaired flexibility, postural dysfunction, and pain.   ACTIVITY LIMITATIONS: carrying, lifting, standing, squatting, stairs, and locomotion level  PARTICIPATION LIMITATIONS: shopping and community activity  PERSONAL FACTORS: Age and 1-2 comorbidities: CX, CHF  are also affecting patient's functional  outcome.   REHAB POTENTIAL: Good  CLINICAL DECISION MAKING: Evolving/moderate complexity  EVALUATION COMPLEXITY: Moderate   GOALS: Goals reviewed with patient? Yes  SHORT TERM GOALS: Target date: 07/20/2023   Pt to be IND with initial HEP for therapeutic progression Baseline: no previous HEP 06/17/23: min verbal cueing Goal status: INITIAL  2.  Pt to increase L hip flexor/ abductor strength to 3+/5 to promote hip stability  Baseline: see flow sheet Goal status: INITIAL  3.  Increase L hip flexion to >/= 90 degrees  Baseline: see flowsheet Goal status: INITIAL  4.  Pt to be able to perform 5 x STS with </= 20 seconds with minimal to no UE assistance Baseline: 20 seconds with  significant UE use Goal status: INITIAL  LONG TERM GOALS: Target date: 08/31/2023   Improve L gross hip strength to >/= 4/5 to promote stability and safety with walking/ standing  Baseline: see flow sheet Goal status: INITIAL  2.  Pt to improve TUG to </= 18 seconds with LRAD to demonstrate improvement in mobility and function Baseline: initial score 31 seconds with 31 seconds Goal status: INITIAL  3.  Pt to be able to walk/ standing with LRAD for >/= 45 min for endurance required ADLS and community ambulation  Baseline:  Goal status: INITIAL  4.  Pt to be able to perform 5 x STS in </= 15 seconds with no UE use for improvement in function. Baseline:  Goal status: INITIAL  5.  Pt to be IND with all HEP and is able to maintain and progress her current LOF IND.  Baseline:  Goal status: INITIAL  PLAN:  PT FREQUENCY: 1-2x/week  PT DURATION: 12 weeks  PLANNED INTERVENTIONS: [id number]- PT Re-evaluation, 97110-Therapeutic exercises, 97530- Therapeutic activity, [id number]- Neuromuscular re-education, 97535- Self Care, [id number]- Manual therapy, [id number]- Gait training, [phone]- Aquatic Therapy, Stair training, Dry Needling, Joint mobilization, Cryotherapy, and Moist heat  PLAN FOR NEXT SESSION: Review/ update  HEP PRN. Reassess FOTO again on 2nd visit    Joellyn Rued MS, PT 06/17/23 1:38 PM

## 2023-06-17 ENCOUNTER — Ambulatory Visit: Payer: 59

## 2023-06-17 DIAGNOSIS — R2689 Other abnormalities of gait and mobility: Secondary | ICD-10-CM | POA: Diagnosis not present

## 2023-06-17 DIAGNOSIS — M6281 Muscle weakness (generalized): Secondary | ICD-10-CM | POA: Diagnosis not present

## 2023-06-17 DIAGNOSIS — M25552 Pain in left hip: Secondary | ICD-10-CM

## 2023-06-21 ENCOUNTER — Encounter: Payer: Self-pay | Admitting: Physical Therapy

## 2023-06-21 ENCOUNTER — Ambulatory Visit: Payer: 59 | Admitting: Physical Therapy

## 2023-06-21 DIAGNOSIS — R2689 Other abnormalities of gait and mobility: Secondary | ICD-10-CM | POA: Diagnosis not present

## 2023-06-21 DIAGNOSIS — M6281 Muscle weakness (generalized): Secondary | ICD-10-CM | POA: Diagnosis not present

## 2023-06-21 DIAGNOSIS — M25552 Pain in left hip: Secondary | ICD-10-CM

## 2023-06-21 NOTE — Therapy (Signed)
OUTPATIENT PHYSICAL THERAPY LOWER EXTREMITY TREATMENT   Patient Name: Katie [name] MRN: [id number] DOB:March 31, 1946, 77 y.o., female Today's Date: 06/21/2023  END OF SESSION:  PT End of Session - 06/21/23 1035     Visit Number 4    Number of Visits 24    Date for PT Re-Evaluation 08/31/23    Authorization Type [employer] MCR    Progress Note Due on Visit 10    PT Start Time 1015    PT Stop Time 1056    PT Time Calculation (min) 41 min               Past Medical History:  Diagnosis Date   Amyloid heart disease (HCC)    Arthritis    Cancer (HCC)    kidney cancer   Chronic combined systolic and diastolic CHF (congestive heart failure) (HCC)    CKD (chronic kidney disease), stage III (HCC)    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 s/p right nephrectomy ~2007.   History of loop recorder    Hypercholesterolemia    Hypertension    Hypothyroidism    Mitral regurgitation    Pulmonary hypertension (HCC)    Stroke (HCC)    no residual   Past Surgical History:  Procedure Laterality Date   BREAST EXCISIONAL BIOPSY Left    BREAST EXCISIONAL BIOPSY Right    CARDIAC CATHETERIZATION N/A 01/12/2016   Procedure: Right/Left Heart Cath and Coronary/Graft Angiography;  Surgeon: Lyn Records, MD;  Location: [facility] INVASIVE CV LAB;  Service: Cardiovascular;  Laterality: N/A;   CERVICAL POLYPECTOMY N/A 04/23/2013   Procedure: CERVICAL POLYPECTOMY;  Surgeon: Dorien Chihuahua. Richardson Dopp, MD;  Location: [facility];  Service: Gynecology;  Laterality: N/A;  Possible Polypectomy   COLONOSCOPY WITH PROPOFOL N/A 12/19/2020   Procedure: COLONOSCOPY WITH PROPOFOL;  Surgeon: Jeani Hawking, MD;  Location: [facility];  Service: Endoscopy;  Laterality: N/A;   HYSTEROSCOPY WITH D & C N/A 04/23/2013   Procedure: DILATATION AND CURETTAGE /HYSTEROSCOPY;  Surgeon: Dorien Chihuahua. Richardson Dopp, MD;  Location: [facility];  Service: Gynecology;  Laterality: N/A;   IR ANGIO INTRA EXTRACRAN SEL  COM CAROTID INNOMINATE BILAT MOD SED  02/11/2017   IR ANGIO VERTEBRAL SEL VERTEBRAL BILAT MOD SED  02/11/2017   KNEE ARTHROSCOPY Left    LOOP RECORDER INSERTION N/A 02/18/2017   Procedure: Loop Recorder Insertion;  Surgeon: Regan Lemming, MD;  Location: [facility] INVASIVE CV LAB;  Service: Cardiovascular;  Laterality: N/A;   MITRAL VALVE REPAIR  11/30/2017   NEPHRECTOMY Right    due to carcinoma   POLYPECTOMY  12/19/2020   Procedure: POLYPECTOMY;  Surgeon: Jeani Hawking, MD;  Location: [facility];  Service: Endoscopy;;   TEE WITHOUT CARDIOVERSION N/A 02/18/2017   Procedure: TRANSESOPHAGEAL ECHOCARDIOGRAM (TEE) WITH LOOP;  Surgeon: Vesta Mixer, MD;  Location: [facility] ENDOSCOPY;  Service: Cardiovascular;  Laterality: N/A;   TOTAL HIP ARTHROPLASTY Left 05/16/2023   Procedure: TOTAL HIP ARTHROPLASTY ANTERIOR APPROACH;  Surgeon: Jodi Geralds, MD;  Location: [facility];  Service: Orthopedics;  Laterality: Left;   TRANSCATHETER MITRAL EDGE TO EDGE REPAIR N/A 11/30/2017   Procedure: MITRAL VALVE REPAIR;  Surgeon: Tonny Bollman, MD;  Location: [facility] INVASIVE CV LAB;  Service: Cardiovascular;  Laterality: N/A;   Patient Active Problem List   Diagnosis Date Noted   Hypotension 05/18/2023   Primary osteoarthritis of left hip 05/17/2023   H/O total hip arthroplasty,  left 05/16/2023   Diverticular disease of colon 03/03/2021   Family history of malignant neoplasm of digestive organs 03/03/2021   Shortness of breath 03/03/2021   Systolic heart failure (HCC) 03/03/2021   Rhabdomyolysis 06/06/2020   Acute kidney injury superimposed on chronic kidney disease (HCC) 06/05/2020   Hypothyroidism    Acidosis    History of stroke 02/06/2018   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/2017   Severe mitral regurgitation 11/30/2017   Amyloid heart disease (HCC)    Myopericarditis 09/06/2017   Non-rheumatic mitral regurgitation    Chronic diastolic congestive heart failure  (HCC) 05/08/2017   CHF exacerbation (HCC) 05/08/2017   Chronic migraine without aura without status migrainosus, not intractable    Hypertension 02/15/2017   Basilar artery occlusion    Cerebellar stroke (HCC) 02/10/2017   Headache 02/10/2017   Chest pain    Mitral regurgitation 01/08/2016   Moderate tricuspid regurgitation 01/08/2016   Pulmonary HTN (HCC) 01/08/2016   Acute CHF (congestive heart failure) (HCC) 01/07/2016   Elevated transaminase level 01/07/2016   Mild hypertension 01/07/2016   CKD (chronic kidney disease), stage III (HCC) 01/07/2016   Hyperlipidemia 01/07/2016   Acute CHF (HCC) 01/07/2016    PCP: Renford Dills, MD  REFERRING PROVIDER: Jodi Geralds, MD  REFERRING DIAG: S/P L THA Ant approach  THERAPY DIAG:  Pain in left hip  Muscle weakness (generalized)  Other abnormalities of gait and mobility  Rationale for Evaluation and Treatment: Rehabilitation  ONSET DATE: DOS 05/16/2023  SUBJECTIVE:   SUBJECTIVE STATEMENT: Pt reports she is doing well. She is not experiencing L hip pain. She is c/o new onset of left arm pain from shoulder to forearm and is also having neck pain. She was encouraged to seek MD appointment.   EVAL: Since the surgery everything is going good. She reported having home health PT for 3 visits. She reports overall everything is going pretty good having occasional pain that occurs on its that goes up to a 5 /10. Currently uses a RW at home and previously before the surgery would occasionally use a SPC.   PERTINENT HISTORY: Kidney Cx, CHF, See above PAIN:  Are you having pain? Yes: NPRS scale: 0/10, at worst 0/10 in the last 24 hours Pain location: in the the front Pain description: sore aching Aggravating factors: unsure Relieving factors: take medication,   PRECAUTIONS: None  RED FLAGS: None   WEIGHT BEARING RESTRICTIONS: No  FALLS:  Has patient fallen in last 6 months? No  LIVING ENVIRONMENT: Lives with: lives with  their family and lives alone Lives in: House/apartment Stairs: Yes: External: 4 steps; on right going up Has following equipment at home: Single point cane, Environmental consultant - 2 wheeled, Marine scientist  OCCUPATION: Retired  PLOF: Independent with basic ADLs  PATIENT GOALS: To get back on my feet and walking.   OBJECTIVE:  Note: Objective measures were completed at Evaluation unless otherwise noted.  DIAGNOSTIC FINDINGS: see chart  PATIENT SURVEYS:  FOTO 79% predicted 76% ( this didn't align with her objective assessment plan to re-capture FOTO assessment on 2nd visit) 06/15/23: 53%, predicted 66%  COGNITION: Overall cognitive status: Within functional limits for tasks assessed     SENSATION: Not tested   POSTURE: rounded shoulders and forward head  PALPATION: TTP along the surgical site/ proximal hip flexor.  Incision site covered by bandage.   LOWER EXTREMITY ROM:  Active ROM Right eval Left eval  Hip flexion 80 80  Hip extension  5 5  Hip abduction 22 supine 20 Supine  Hip adduction    Hip internal rotation    Hip external rotation    Knee flexion    Knee extension    Ankle dorsiflexion    Ankle plantarflexion    Ankle inversion    Ankle eversion     (Blank rows = not tested)  LOWER EXTREMITY MMT:  MMT Right eval Left eval  Hip flexion 3 3  Hip extension 3 3  Hip abduction Seated 3 seated3  Hip adduction Seated 4- Seated 4-  Hip internal rotation    Hip external rotation    Knee flexion 4 4  Knee extension 4 4  Ankle dorsiflexion    Ankle plantarflexion    Ankle inversion    Ankle eversion     (Blank rows = not tested)  FUNCTIONAL TESTS:  5 times sit to stand: 20 seconds with significant use of hands pushing up from chair.  Timed up and go (TUG): 31 seconds   GAIT: Distance walked: 85 ft from waiting area to treatment room  Assistive device utilized: Walker - 2 wheeled Level of assistance: Modified independence Comments: decreased stride,  flexed trunk   [facility] Adult PT Treatment:                                                DATE: 06/21/23 Therapeutic Exercise: Nustep L3 UE/:E x 6 minutes  Heel raises x 20 Standing hip abdct 10 x 2 each Standing hip flexion x 10 each STS x 5 without UE Supine march Supine clam blue SLR x 12 each    [facility] Adult PT Treatment:                                                DATE: 06/17/23 Therapeutic Exercise: Nustep 5 min L5 UE/LE LAQ 2x10 3# STS c use of hands 5x Hooklying Single Knee to Chest 2 reps - 30 seconds hold SLR c QS 2x10. Quad lag not present Supine Bridge 2x10 reps Hooklying Clamshell c BluTB 2x15 reps Supine March 15 reps  Standing hip abd 2x10 each Heel raise/toe lifts 2x10 Therapeutic Activity: Gait training for ambulation with c SPC 90' x 2 and SBA for safety  [facility] Adult PT Treatment:                                                DATE: 06/15/23 Therapeutic Exercise: SLR c QS x10. Quad lag not present Supine Bridge 15 reps Hooklying Clamshell c BluTB 15 reps Supine March 10 reps  Seated Gluteal Sets 15 reps LAQ L x15 3" Hooklying Single Knee to Chest 2 reps - 30 seconds hold Updated HEP Therapeutic Activity: Gait training in // bars to mimic use of SPC c 1 bar using R hand; then c a SPC c SBA was provided for safety due to L LE weakness. Reassess FOTO  [facility] Adult PT Treatment:                                                DATE: 06/08/2023 Therapeutic Exercise: Hooklying clamshell 2 x 10 with RTB Bridge 2 x 10 SKTC 1 x 30 L Supine marching 1 x 20 alternating L/R  Provided initial HEP  PATIENT EDUCATION:  Education details: evaluation findings, POC, goals, HEP with proper form/ rationale.  Person educated: Patient Education method: Explanation, Verbal cues, and Handouts Education comprehension: verbalized  understanding  HOME EXERCISE PROGRAM: Access Code: [email or web address] URL: [email or web address] Date: 06/15/2023 Prepared by: Joellyn Rued  Exercises - Supine Bridge  - 1 x daily - 7 x weekly - 2 sets - 10 reps - Hooklying Clamshell with Resistance  - 1 x daily - 7 x weekly - 2 sets - 15 reps - Supine March  - 1 x daily - 7 x weekly - 2 sets - 10 reps - 5 hold - Seated Gluteal Sets  - 1 x daily - 7 x weekly - 2 sets - 10 reps - Hooklying Single Knee to Chest Stretch  - 1 x daily - 7 x weekly - 2 sets - 2 reps - 30 seconds hold - Active Straight Leg Raise with Quad Set  - 1 x daily - 7 x weekly - 2 sets - 10 reps - 3 hold - Seated Long Arc Quad  - 1 x daily - 7 x weekly - 2 sets - 10 reps - 3 hold  ASSESSMENT:  CLINICAL IMPRESSION: Therex was then completed or LE strengthening. Min verbal cueing was provided for most proper technique c therex. Pt is experiencing a new onset of left arm pain that could be coming from her neck. Recommended she see MD.  Pt was able to perform STS without UE with cues. Pt tolerated PT today without adverse effects. Pt will continue to benefit from skilled PT to address impairments for improved L hip/LE function.   EVAL: Patient is a 77 y.o. F who was seen today for physical therapy evaluation and treatment for s/p L THA on 05/16/23.Patient exhibits limited hip flexion/ abduction AROM secondary to weakness, and limited hip extension secondary to anterior stiffness. MMT reveals gross L hip weakness, pt was unable to perform sit to stand without UE assist and completed 5 x STS using bil UE in 20 seconds and a TUG with RW at 31 seconds indicating higher risk of falling. Patient would benefit from physical therapy to promote hip mobility, increase strength and stability, maximize her function by addressing the deficits listed.   OBJECTIVE IMPAIRMENTS: decreased activity tolerance, decreased balance, decreased endurance, decreased ROM, decreased strength, impaired  flexibility, postural dysfunction, and pain.   ACTIVITY LIMITATIONS: carrying, lifting, standing, squatting, stairs, and locomotion level  PARTICIPATION LIMITATIONS: shopping and community activity  PERSONAL FACTORS: Age and 1-2 comorbidities: CX, CHF  are also affecting patient's functional outcome.   REHAB POTENTIAL: Good  CLINICAL DECISION MAKING: Evolving/moderate complexity  EVALUATION COMPLEXITY: Moderate   GOALS: Goals reviewed with patient? Yes  SHORT TERM GOALS: Target date: 07/20/2023   Pt to be IND with initial HEP for therapeutic progression Baseline: no previous HEP 06/17/23: min verbal cueing Goal status: INITIAL  2.  Pt to increase L hip flexor/ abductor strength to 3+/5 to promote hip stability  Baseline: see flow sheet Goal status:  INITIAL  3.  Increase L hip flexion to >/= 90 degrees  Baseline: see flowsheet Goal status: INITIAL  4.  Pt to be able to perform 5 x STS with </= 20 seconds with minimal to no UE assistance Baseline: 20 seconds with significant UE use Goal status: INITIAL  LONG TERM GOALS: Target date: 08/31/2023   Improve L gross hip strength to >/= 4/5 to promote stability and safety with walking/ standing  Baseline: see flow sheet Goal status: INITIAL  2.  Pt to improve TUG to </= 18 seconds with LRAD to demonstrate improvement in mobility and function Baseline: initial score 31 seconds with 31 seconds Goal status: INITIAL  3.  Pt to be able to walk/ standing with LRAD for >/= 45 min for endurance required ADLS and community ambulation  Baseline:  Goal status: INITIAL  4.  Pt to be able to perform 5 x STS in </= 15 seconds with no UE use for improvement in function. Baseline:  Goal status: INITIAL  5.  Pt to be IND with all HEP and is able to maintain and progress her current LOF IND.  Baseline:  Goal status: INITIAL  PLAN:  PT FREQUENCY: 1-2x/week  PT DURATION: 12 weeks  PLANNED INTERVENTIONS: 97164- PT Re-evaluation,  97110-Therapeutic exercises, 97530- Therapeutic activity, [id number]- Neuromuscular re-education, 97535- Self Care, 98119- Manual therapy, [id number]- Gait training, [phone]- Aquatic Therapy, Stair training, Dry Needling, Joint mobilization, Cryotherapy, and Moist heat  PLAN FOR NEXT SESSION: Review/ update HEP PRN.    Jannette Spanner, PTA 06/21/23 10:56 AM Phone: [phone] Fax: [phone]

## 2023-06-22 ENCOUNTER — Other Ambulatory Visit: Payer: Self-pay

## 2023-06-23 ENCOUNTER — Ambulatory Visit: Payer: 59 | Admitting: Physical Therapy

## 2023-06-23 ENCOUNTER — Encounter: Payer: Self-pay | Admitting: Physical Therapy

## 2023-06-23 DIAGNOSIS — M6281 Muscle weakness (generalized): Secondary | ICD-10-CM

## 2023-06-23 DIAGNOSIS — M25552 Pain in left hip: Secondary | ICD-10-CM

## 2023-06-23 DIAGNOSIS — R2689 Other abnormalities of gait and mobility: Secondary | ICD-10-CM | POA: Diagnosis not present

## 2023-06-23 NOTE — Therapy (Signed)
OUTPATIENT PHYSICAL THERAPY LOWER EXTREMITY TREATMENT   Patient Name: Katie [name] MRN: [id number] DOB:1945-08-25, 77 y.o., female Today's Date: 06/23/2023  END OF SESSION:  PT End of Session - 06/23/23 1021     Visit Number 5    Number of Visits 24    Date for PT Re-Evaluation 08/31/23    Authorization Type [employer] MCR    Progress Note Due on Visit 10    PT Start Time 1015    PT Stop Time 1058    PT Time Calculation (min) 43 min               Past Medical History:  Diagnosis Date   Amyloid heart disease (HCC)    Arthritis    Cancer (HCC)    kidney cancer   Chronic combined systolic and diastolic CHF (congestive heart failure) (HCC)    CKD (chronic kidney disease), stage III (HCC)    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 s/p right nephrectomy ~2007.   History of loop recorder    Hypercholesterolemia    Hypertension    Hypothyroidism    Mitral regurgitation    Pulmonary hypertension (HCC)    Stroke (HCC)    no residual   Past Surgical History:  Procedure Laterality Date   BREAST EXCISIONAL BIOPSY Left    BREAST EXCISIONAL BIOPSY Right    CARDIAC CATHETERIZATION N/A 01/12/2016   Procedure: Right/Left Heart Cath and Coronary/Graft Angiography;  Surgeon: Lyn Records, MD;  Location: [facility] INVASIVE CV LAB;  Service: Cardiovascular;  Laterality: N/A;   CERVICAL POLYPECTOMY N/A 04/23/2013   Procedure: CERVICAL POLYPECTOMY;  Surgeon: Dorien Chihuahua. Richardson Dopp, MD;  Location: [facility] ORS;  Service: Gynecology;  Laterality: N/A;  Possible Polypectomy   COLONOSCOPY WITH PROPOFOL N/A 12/19/2020   Procedure: COLONOSCOPY WITH PROPOFOL;  Surgeon: Jeani Hawking, MD;  Location: [facility] ENDOSCOPY;  Service: Endoscopy;  Laterality: N/A;   HYSTEROSCOPY WITH D & C N/A 04/23/2013   Procedure: DILATATION AND CURETTAGE /HYSTEROSCOPY;  Surgeon: Dorien Chihuahua. Richardson Dopp, MD;  Location: [facility];  Service: Gynecology;  Laterality: N/A;   IR ANGIO INTRA EXTRACRAN SEL  COM CAROTID INNOMINATE BILAT MOD SED  02/11/2017   IR ANGIO VERTEBRAL SEL VERTEBRAL BILAT MOD SED  02/11/2017   KNEE ARTHROSCOPY Left    LOOP RECORDER INSERTION N/A 02/18/2017   Procedure: Loop Recorder Insertion;  Surgeon: Regan Lemming, MD;  Location: [facility] INVASIVE CV LAB;  Service: Cardiovascular;  Laterality: N/A;   MITRAL VALVE REPAIR  11/30/2017   NEPHRECTOMY Right    due to carcinoma   POLYPECTOMY  12/19/2020   Procedure: POLYPECTOMY;  Surgeon: Jeani Hawking, MD;  Location: [facility] ENDOSCOPY;  Service: Endoscopy;;   TEE WITHOUT CARDIOVERSION N/A 02/18/2017   Procedure: TRANSESOPHAGEAL ECHOCARDIOGRAM (TEE) WITH LOOP;  Surgeon: Vesta Mixer, MD;  Location: [facility] ENDOSCOPY;  Service: Cardiovascular;  Laterality: N/A;   TOTAL HIP ARTHROPLASTY Left 05/16/2023   Procedure: TOTAL HIP ARTHROPLASTY ANTERIOR APPROACH;  Surgeon: Jodi Geralds, MD;  Location: [facility];  Service: Orthopedics;  Laterality: Left;   TRANSCATHETER MITRAL EDGE TO EDGE REPAIR N/A 11/30/2017   Procedure: MITRAL VALVE REPAIR;  Surgeon: Tonny Bollman, MD;  Location: [facility] INVASIVE CV LAB;  Service: Cardiovascular;  Laterality: N/A;   Patient Active Problem List   Diagnosis Date Noted   Hypotension 05/18/2023   Primary osteoarthritis of left hip 05/17/2023   H/O total hip arthroplasty,  left 05/16/2023   Diverticular disease of colon 03/03/2021   Family history of malignant neoplasm of digestive organs 03/03/2021   Shortness of breath 03/03/2021   Systolic heart failure (HCC) 03/03/2021   Rhabdomyolysis 06/06/2020   Acute kidney injury superimposed on chronic kidney disease (HCC) 06/05/2020   Hypothyroidism    Acidosis    History of stroke 02/06/2018   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/2017   Severe mitral regurgitation 11/30/2017   Amyloid heart disease (HCC)    Myopericarditis 09/06/2017   Non-rheumatic mitral regurgitation    Chronic diastolic congestive heart failure  (HCC) 05/08/2017   CHF exacerbation (HCC) 05/08/2017   Chronic migraine without aura without status migrainosus, not intractable    Hypertension 02/15/2017   Basilar artery occlusion    Cerebellar stroke (HCC) 02/10/2017   Headache 02/10/2017   Chest pain    Mitral regurgitation 01/08/2016   Moderate tricuspid regurgitation 01/08/2016   Pulmonary HTN (HCC) 01/08/2016   Acute CHF (congestive heart failure) (HCC) 01/07/2016   Elevated transaminase level 01/07/2016   Mild hypertension 01/07/2016   CKD (chronic kidney disease), stage III (HCC) 01/07/2016   Hyperlipidemia 01/07/2016   Acute CHF (HCC) 01/07/2016    PCP: Renford Dills, MD  REFERRING PROVIDER: Jodi Geralds, MD  REFERRING DIAG: S/P L THA Ant approach  THERAPY DIAG:  Pain in left hip  Muscle weakness (generalized)  Rationale for Evaluation and Treatment: Rehabilitation  ONSET DATE: DOS 05/16/2023  SUBJECTIVE:   SUBJECTIVE STATEMENT: Pt reports she is doing well. She is not experiencing L hip pain. New arm pain is better than it was at last visit.    EVAL: Since the surgery everything is going good. She reported having home health PT for 3 visits. She reports overall everything is going pretty good having occasional pain that occurs on its that goes up to a 5 /10. Currently uses a RW at home and previously before the surgery would occasionally use a SPC.   PERTINENT HISTORY: Kidney Cx, CHF, See above PAIN:  Are you having pain? Yes: NPRS scale: 0/10, at worst 0/10 in the last 24 hours Pain location: in the the front Pain description: sore aching Aggravating factors: unsure Relieving factors: take medication,   PRECAUTIONS: None  RED FLAGS: None   WEIGHT BEARING RESTRICTIONS: No  FALLS:  Has patient fallen in last 6 months? No  LIVING ENVIRONMENT: Lives with: lives with their family and lives alone Lives in: House/apartment Stairs: Yes: External: 4 steps; on right going up Has following equipment  at home: Single point cane, Environmental consultant - 2 wheeled, Marine scientist  OCCUPATION: Retired  PLOF: Independent with basic ADLs  PATIENT GOALS: To get back on my feet and walking.   OBJECTIVE:  Note: Objective measures were completed at Evaluation unless otherwise noted.  DIAGNOSTIC FINDINGS: see chart  PATIENT SURVEYS:  FOTO 79% predicted 76% ( this didn't align with her objective assessment plan to re-capture FOTO assessment on 2nd visit) 06/15/23: 53%, predicted 66%  COGNITION: Overall cognitive status: Within functional limits for tasks assessed     SENSATION: Not tested   POSTURE: rounded shoulders and forward head  PALPATION: TTP along the surgical site/ proximal hip flexor.  Incision site covered by bandage.   LOWER EXTREMITY ROM:  Active ROM Right eval Left eval Left 06/23/23  Hip flexion 80 80 100  Hip extension 5 5   Hip abduction 22 supine 20 Supine   Hip adduction  Hip internal rotation     Hip external rotation     Knee flexion     Knee extension     Ankle dorsiflexion     Ankle plantarflexion     Ankle inversion     Ankle eversion      (Blank rows = not tested)  LOWER EXTREMITY MMT:  MMT Right eval Left eval  Hip flexion 3 3  Hip extension 3 3  Hip abduction Seated 3 seated3  Hip adduction Seated 4- Seated 4-  Hip internal rotation    Hip external rotation    Knee flexion 4 4  Knee extension 4 4  Ankle dorsiflexion    Ankle plantarflexion    Ankle inversion    Ankle eversion     (Blank rows = not tested)  FUNCTIONAL TESTS:  5 times sit to stand: 20 seconds with significant use of hands pushing up from chair.  Timed up and go (TUG): 31 seconds   GAIT: Distance walked: 85 ft from waiting area to treatment room  Assistive device utilized: Walker - 2 wheeled Level of assistance: Modified independence Comments: decreased stride, flexed trunk   [facility] Adult PT Treatment:                                                 DATE: 06/23/23 Therapeutic Exercise: Nustep L5 UE/LE x 6 minutes  Heel raises x 20 Standing hip abduction 10 x 3  Standing march x 10 each  Standing right hip hikes 10 x 2  Seated LAQ 4# 15 x 3 each Seated march AROM  Ball squeeze 10 x 3  SKTC x 30 se left LTR comfortable ROM  Therapeutic Activity: Gait in parallel bars with 1 UE Side stepping in // bars x 4  Gait in // bars without UE- focus on equal hip level s   [facility] Adult PT Treatment:                                                DATE: 06/21/23 Therapeutic Exercise: Nustep L3 UE/:E x 6 minutes  Heel raises x 20 Standing hip abdct 10 x 2 each Standing hip flexion x 10 each STS x 5 without UE Supine march Supine clam blue SLR x 12 each    [facility] Adult PT Treatment:                                                DATE: 06/17/23 Therapeutic Exercise: Nustep 5 min L5 UE/LE LAQ 2x10 3# STS c use of hands 5x Hooklying Single Knee to Chest 2 reps - 30 seconds hold SLR c QS 2x10. Quad lag not present Supine Bridge 2x10 reps Hooklying Clamshell c BluTB 2x15 reps Supine March 15 reps  Standing hip abd 2x10 each Heel raise/toe lifts 2x10 Therapeutic Activity: Gait training for ambulation with c SPC 90' x 2 and SBA for safety  [facility] Adult PT Treatment:  DATE: 06/15/23 Therapeutic Exercise: SLR c QS x10. Quad lag not present Supine Bridge 15 reps Hooklying Clamshell c BluTB 15 reps Supine March 10 reps  Seated Gluteal Sets 15 reps LAQ L x15 3" Hooklying Single Knee to Chest 2 reps - 30 seconds hold Updated HEP Therapeutic Activity: Gait training in // bars to mimic use of SPC c 1 bar using R hand; then c a SPC c SBA was provided for safety due to L LE weakness. Kandis Mannan                                                                                                                       [facility] Adult PT Treatment:                                                DATE:  06/08/2023 Therapeutic Exercise: Hooklying clamshell 2 x 10 with RTB Bridge 2 x 10 SKTC 1 x 30 L Supine marching 1 x 20 alternating L/R  Provided initial HEP  PATIENT EDUCATION:  Education details: evaluation findings, POC, goals, HEP with proper form/ rationale.  Person educated: Patient Education method: Explanation, Verbal cues, and Handouts Education comprehension: verbalized understanding  HOME EXERCISE PROGRAM: Access Code: [email or web address] URL: [email or web address] Date: 06/15/2023 Prepared by: Joellyn Rued  Exercises - Supine Bridge  - 1 x daily - 7 x weekly - 2 sets - 10 reps - Hooklying Clamshell with Resistance  - 1 x daily - 7 x weekly - 2 sets - 15 reps - Supine March  - 1 x daily - 7 x weekly - 2 sets - 10 reps - 5 hold - Seated Gluteal Sets  - 1 x daily - 7 x weekly - 2 sets - 10 reps - Hooklying Single Knee to Chest Stretch  - 1 x daily - 7 x weekly - 2 sets - 2 reps - 30 seconds hold - Active Straight Leg Raise with Quad Set  - 1 x daily - 7 x weekly - 2 sets - 10 reps - 3 hold - Seated Long Arc Quad  - 1 x daily - 7 x weekly - 2 sets - 10 reps - 3 hold  ASSESSMENT:  CLINICAL IMPRESSION: Therex was then completed or LE strengthening. Min verbal cueing was provided for most proper technique c therex. Worked on gait in // bars without AD, cues needed for level hips and equal steps. Left hp flexion improved to  100 deg. STG# 3 met.  Pt tolerated PT today without adverse effects. Pt will continue to benefit from skilled PT to address impairments for improved L hip/LE function.   EVAL: Patient is a 77 y.o. F who was seen today for physical therapy evaluation and treatment for s/p L THA on 05/16/23.Patient exhibits limited hip flexion/ abduction AROM secondary to weakness, and limited hip extension secondary to anterior  stiffness. MMT reveals gross L hip weakness, pt was unable to perform sit to stand without UE assist and completed 5 x STS using bil UE in 20  seconds and a TUG with RW at 31 seconds indicating higher risk of falling. Patient would benefit from physical therapy to promote hip mobility, increase strength and stability, maximize her function by addressing the deficits listed.   OBJECTIVE IMPAIRMENTS: decreased activity tolerance, decreased balance, decreased endurance, decreased ROM, decreased strength, impaired flexibility, postural dysfunction, and pain.   ACTIVITY LIMITATIONS: carrying, lifting, standing, squatting, stairs, and locomotion level  PARTICIPATION LIMITATIONS: shopping and community activity  PERSONAL FACTORS: Age and 1-2 comorbidities: CX, CHF  are also affecting patient's functional outcome.   REHAB POTENTIAL: Good  CLINICAL DECISION MAKING: Evolving/moderate complexity  EVALUATION COMPLEXITY: Moderate   GOALS: Goals reviewed with patient? Yes  SHORT TERM GOALS: Target date: 07/20/2023   Pt to be IND with initial HEP for therapeutic progression Baseline: no previous HEP 06/17/23: min verbal cueing Goal status: INITIAL  2.  Pt to increase L hip flexor/ abductor strength to 3+/5 to promote hip stability  Baseline: see flow sheet Goal status: INITIAL  3.  Increase L hip flexion to >/= 90 degrees  Baseline: see flowsheet 06/23/23: 100 Goal status: MET  4.  Pt to be able to perform 5 x STS with </= 20 seconds with minimal to no UE assistance Baseline: 20 seconds with significant UE use Goal status: INITIAL  LONG TERM GOALS: Target date: 08/31/2023   Improve L gross hip strength to >/= 4/5 to promote stability and safety with walking/ standing  Baseline: see flow sheet Goal status: INITIAL  2.  Pt to improve TUG to </= 18 seconds with LRAD to demonstrate improvement in mobility and function Baseline: initial score 31 seconds with 31 seconds Goal status: INITIAL  3.  Pt to be able to walk/ standing with LRAD for >/= 45 min for endurance required ADLS and community ambulation  Baseline:  Goal  status: INITIAL  4.  Pt to be able to perform 5 x STS in </= 15 seconds with no UE use for improvement in function. Baseline:  Goal status: INITIAL  5.  Pt to be IND with all HEP and is able to maintain and progress her current LOF IND.  Baseline:  Goal status: INITIAL  PLAN:  PT FREQUENCY: 1-2x/week  PT DURATION: 12 weeks  PLANNED INTERVENTIONS: 97164- PT Re-evaluation, 97110-Therapeutic exercises, 97530- Therapeutic activity, [id number]- Neuromuscular re-education, 97535- Self Care, [id number]- Manual therapy, [id number]- Gait training, [phone]- Aquatic Therapy, Stair training, Dry Needling, Joint mobilization, Cryotherapy, and Moist heat  PLAN FOR NEXT SESSION: Review/ update HEP PRN.    Jannette Spanner, PTA 06/23/23 10:58 AM Phone: [phone] Fax: [phone]

## 2023-06-28 ENCOUNTER — Ambulatory Visit: Payer: 59 | Admitting: Physical Therapy

## 2023-07-05 ENCOUNTER — Ambulatory Visit: Payer: 59 | Attending: Orthopedic Surgery | Admitting: Physical Therapy

## 2023-07-05 ENCOUNTER — Other Ambulatory Visit (HOSPITAL_COMMUNITY): Payer: Self-pay | Admitting: Pharmacy Technician

## 2023-07-05 ENCOUNTER — Other Ambulatory Visit (HOSPITAL_COMMUNITY): Payer: Self-pay

## 2023-07-05 ENCOUNTER — Encounter: Payer: Self-pay | Admitting: Physical Therapy

## 2023-07-05 DIAGNOSIS — R2689 Other abnormalities of gait and mobility: Secondary | ICD-10-CM | POA: Diagnosis not present

## 2023-07-05 DIAGNOSIS — M6281 Muscle weakness (generalized): Secondary | ICD-10-CM | POA: Diagnosis not present

## 2023-07-05 DIAGNOSIS — M25552 Pain in left hip: Secondary | ICD-10-CM | POA: Diagnosis not present

## 2023-07-05 NOTE — Progress Notes (Signed)
Specialty Pharmacy Refill Coordination Note  Katie [name] is a 77 y.o. female contacted today regarding refills of specialty medication(s) Tafamidis   Patient requested Delivery   Delivery date: 07/15/23   Verified address: [address]  [place]   Medication will be filled on 07/14/2023.

## 2023-07-05 NOTE — Therapy (Signed)
OUTPATIENT PHYSICAL THERAPY LOWER EXTREMITY TREATMENT   Patient Name: Katie [name] MRN: [id number] DOB:Jul 20, 1946, 77 y.o., female Today's Date: 07/05/2023  END OF SESSION:  PT End of Session - 07/05/23 1317     Visit Number 6    Number of Visits 24    Date for PT Re-Evaluation 08/31/23    Authorization Type [employer] MCR    PT Start Time 1015    PT Stop Time 1058    PT Time Calculation (min) 43 min               Past Medical History:  Diagnosis Date   Amyloid heart disease (HCC)    Arthritis    Cancer (HCC)    kidney cancer   Chronic combined systolic and diastolic CHF (congestive heart failure) (HCC)    CKD (chronic kidney disease), stage III (HCC)    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 s/p right nephrectomy ~2007.   History of loop recorder    Hypercholesterolemia    Hypertension    Hypothyroidism    Mitral regurgitation    Pulmonary hypertension (HCC)    Stroke (HCC)    no residual   Past Surgical History:  Procedure Laterality Date   BREAST EXCISIONAL BIOPSY Left    BREAST EXCISIONAL BIOPSY Right    CARDIAC CATHETERIZATION N/A 01/12/2016   Procedure: Right/Left Heart Cath and Coronary/Graft Angiography;  Surgeon: Lyn Records, MD;  Location: [facility] INVASIVE CV LAB;  Service: Cardiovascular;  Laterality: N/A;   CERVICAL POLYPECTOMY N/A 04/23/2013   Procedure: CERVICAL POLYPECTOMY;  Surgeon: Dorien Chihuahua. Richardson Dopp, MD;  Location: [facility]S;  Service: Gynecology;  Laterality: N/A;  Possible Polypectomy   COLONOSCOPY WITH PROPOFOL N/A 12/19/2020   Procedure: COLONOSCOPY WITH PROPOFOL;  Surgeon: Jeani Hawking, MD;  Location: [facility] ENDOSCOPY;  Service: Endoscopy;  Laterality: N/A;   HYSTEROSCOPY WITH D & C N/A 04/23/2013   Procedure: DILATATION AND CURETTAGE /HYSTEROSCOPY;  Surgeon: Dorien Chihuahua. Richardson Dopp, MD;  Location: [facility];  Service: Gynecology;  Laterality: N/A;   IR ANGIO INTRA EXTRACRAN SEL COM CAROTID INNOMINATE BILAT MOD SED   02/11/2017   IR ANGIO VERTEBRAL SEL VERTEBRAL BILAT MOD SED  02/11/2017   KNEE ARTHROSCOPY Left    LOOP RECORDER INSERTION N/A 02/18/2017   Procedure: Loop Recorder Insertion;  Surgeon: Regan Lemming, MD;  Location: [facility] INVASIVE CV LAB;  Service: Cardiovascular;  Laterality: N/A;   MITRAL VALVE REPAIR  11/30/2017   NEPHRECTOMY Right    due to carcinoma   POLYPECTOMY  12/19/2020   Procedure: POLYPECTOMY;  Surgeon: Jeani Hawking, MD;  Location: [facility] ENDOS[facility];  Service: Endoscopy;;   TEE WITHOUT CARDIOVERSION N/A 02/18/2017   Procedure: TRANSESOPHAGEAL ECHOCARDIOGRAM (TEE) WITH LOOP;  Surgeon: Vesta Mixer, MD;  Location: [facility] ENDOSCOPY;  Service: Cardiovascular;  Laterality: N/A;   TOTAL HIP ARTHROPLASTY Left 05/16/2023   Procedure: TOTAL HIP ARTHROPLASTY ANTERIOR APPROACH;  Surgeon: Jodi Geralds, MD;  Location: [facility];  Service: Orthopedics;  Laterality: Left;   TRANSCATHETER MITRAL EDGE TO EDGE REPAIR N/A 11/30/2017   Procedure: MITRAL VALVE REPAIR;  Surgeon: Tonny Bollman, MD;  Location: [facility] INVASIVE CV LAB;  Service: Cardiovascular;  Laterality: N/A;   Patient Active Problem List   Diagnosis Date Noted   Hypotension 05/18/2023   Primary osteoarthritis of left hip 05/17/2023   H/O total hip arthroplasty, left 05/16/2023   Diverticular disease of colon 03/03/2021  Family history of malignant neoplasm of digestive organs 03/03/2021   Shortness of breath 03/03/2021   Systolic heart failure (HCC) 03/03/2021   Rhabdomyolysis 06/06/2020   Acute kidney injury superimposed on chronic kidney disease (HCC) 06/05/2020   Hypothyroidism    Acidosis    History of stroke 02/06/2018   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/2017   Severe mitral regurgitation 11/30/2017   Amyloid heart disease (HCC)    Myopericarditis 09/06/2017   Non-rheumatic mitral regurgitation    Chronic diastolic congestive heart failure (HCC) 05/08/2017   CHF exacerbation  (HCC) 05/08/2017   Chronic migraine without aura without status migrainosus, not intractable    Hypertension 02/15/2017   Basilar artery occlusion    Cerebellar stroke (HCC) 02/10/2017   Headache 02/10/2017   Chest pain    Mitral regurgitation 01/08/2016   Moderate tricuspid regurgitation 01/08/2016   Pulmonary HTN (HCC) 01/08/2016   Acute CHF (congestive heart failure) (HCC) 01/07/2016   Elevated transaminase level 01/07/2016   Mild hypertension 01/07/2016   CKD (chronic kidney disease), stage III (HCC) 01/07/2016   Hyperlipidemia 01/07/2016   Acute CHF (HCC) 01/07/2016    PCP: Renford Dills, MD  REFERRING PROVIDER: Jodi Geralds, MD  REFERRING DIAG: S/P L THA Ant approach  THERAPY DIAG:  Pain in left hip  Muscle weakness (generalized)  Other abnormalities of gait and mobility  Rationale for Evaluation and Treatment: Rehabilitation  ONSET DATE: DOS 05/16/2023  SUBJECTIVE:   SUBJECTIVE STATEMENT: Saw orthopedic this morning. He said my hip was healed and will see him back in 6 months to see about the right hip replacement.    EVAL: Since the surgery everything is going good. She reported having home health PT for 3 visits. She reports overall everything is going pretty good having occasional pain that occurs on its that goes up to a 5 /10. Currently uses a RW at home and previously before the surgery would occasionally use a SPC.   PERTINENT HISTORY: Kidney Cx, CHF, See above PAIN:  Are you having pain? Yes: NPRS scale: 0/10, at worst 0/10 in the last 24 hours Pain location: in the the front Pain description: sore aching Aggravating factors: unsure Relieving factors: take medication,   PRECAUTIONS: None  RED FLAGS: None   WEIGHT BEARING RESTRICTIONS: No  FALLS:  Has patient fallen in last 6 months? No  LIVING ENVIRONMENT: Lives with: lives with their family and lives alone Lives in: House/apartment Stairs: Yes: External: 4 steps; on right going up Has  following equipment at home: Single point cane, Environmental consultant - 2 wheeled, Marine scientist  OCCUPATION: Retired  PLOF: Independent with basic ADLs  PATIENT GOALS: To get back on my feet and walking.   OBJECTIVE:  Note: Objective measures were completed at Evaluation unless otherwise noted.  DIAGNOSTIC FINDINGS: see chart  PATIENT SURVEYS:  FOTO 79% predicted 76% ( this didn't align with her objective assessment plan to re-capture FOTO assessment on 2nd visit) 06/15/23: 53%, predicted 66%  COGNITION: Overall cognitive status: Within functional limits for tasks assessed     SENSATION: Not tested   POSTURE: rounded shoulders and forward head  PALPATION: TTP along the surgical site/ proximal hip flexor.  Incision site covered by bandage.   LOWER EXTREMITY ROM:  Active ROM Right eval Left eval Left 06/23/23  Hip flexion 80 80 100  Hip extension 5 5   Hip abduction 22 supine 20 Supine   Hip adduction     Hip internal rotation  Hip external rotation     Knee flexion     Knee extension     Ankle dorsiflexion     Ankle plantarflexion     Ankle inversion     Ankle eversion      (Blank rows = not tested)  LOWER EXTREMITY MMT:  MMT Right eval Left eval Left 07/05/23  Hip flexion 3 3 4   Hip extension 3 3   Hip abduction Seated 3 seated3 4- seated  Hip adduction Seated 4- Seated 4-   Hip internal rotation     Hip external rotation     Knee flexion 4 4   Knee extension 4 4   Ankle dorsiflexion     Ankle plantarflexion     Ankle inversion     Ankle eversion      (Blank rows = not tested)  FUNCTIONAL TESTS:  5 times sit to stand: 20 seconds with significant use of hands pushing up from chair. 07/05/23: 14 sec with UE to rise  Timed up and go (TUG): 31 seconds   GAIT: Distance walked: 85 ft from waiting area to treatment room  Assistive device utilized: Walker - 2 wheeled Level of assistance: Modified independence Comments: decreased stride, flexed  trunk   [facility] Adult PT Treatment:                                                DATE: 07/05/23 Therapeutic Exercise: Nustep L5 UE/LE x 5 min Bridge 2 x 10 Left SLR x 10 Left side clam 10 x 2  Left hip abduction x12 Single leg bridge x 10 each   Therapeutic Activity: 5 x STS 14 sec needs UE to rise.  Gait with SPC, SBA - mod cues for sequencing    [facility] Adult PT Treatment:                                                DATE: 06/23/23 Therapeutic Exercise: Nustep L5 UE/LE x 6 minutes  Heel raises x 20 Standing hip abduction 10 x 3  Standing march x 10 each  Standing right hip hikes 10 x 2  Seated LAQ 4# 15 x 3 each Seated march AROM  Ball squeeze 10 x 3  SKTC x 30 se left LTR comfortable ROM  Therapeutic Activity: Gait in parallel bars with 1 UE Side stepping in // bars x 4  Gait in // bars without UE- focus on equal hip level s   [facility] Adult PT Treatment:                                                DATE: 06/21/23 Therapeutic Exercise: Nustep L3 UE/:E x 6 minutes  Heel raises x 20 Standing hip abdct 10 x 2 each Standing hip flexion x 10 each STS x 5 without UE Supine march Supine clam blue SLR x 12 each    [facility] Adult PT Treatment:  DATE: 06/17/23 Therapeutic Exercise: Nustep 5 min L5 UE/LE LAQ 2x10 3# STS c use of hands 5x Hooklying Single Knee to Chest 2 reps - 30 seconds hold SLR c QS 2x10. Quad lag not present Supine Bridge 2x10 reps Hooklying Clamshell c BluTB 2x15 reps Supine March 15 reps  Standing hip abd 2x10 each Heel raise/toe lifts 2x10 Therapeutic Activity: Gait training for ambulation with c SPC 90' x 2 and SBA for safety  [facility] Adult PT Treatment:                                                DATE: 06/15/23 Therapeutic Exercise: SLR c QS x10. Quad lag not present Supine Bridge 15 reps Hooklying Clamshell c BluTB 15 reps Supine March 10 reps  Seated Gluteal Sets 15  reps LAQ L x15 3" Hooklying Single Knee to Chest 2 reps - 30 seconds hold Updated HEP Therapeutic Activity: Gait training in // bars to mimic use of SPC c 1 bar using R hand; then c a SPC c SBA was provided for safety due to L LE weakness. Kandis Mannan                                                                                                                       [facility] Adult PT Treatment:                                                DATE: 06/08/2023 Therapeutic Exercise: Hooklying clamshell 2 x 10 with RTB Bridge 2 x 10 SKTC 1 x 30 L Supine marching 1 x 20 alternating L/R  Provided initial HEP  PATIENT EDUCATION:  Education details: evaluation findings, POC, goals, HEP with proper form/ rationale.  Person educated: Patient Education method: Explanation, Verbal cues, and Handouts Education comprehension: verbalized understanding  HOME EXERCISE PROGRAM: Access Code: [email or web address] URL: [email or web address] Date: 06/15/2023 Prepared by: Joellyn Rued  Exercises - Supine Bridge  - 1 x daily - 7 x weekly - 2 sets - 10 reps - Hooklying Clamshell with Resistance  - 1 x daily - 7 x weekly - 2 sets - 15 reps - Supine March  - 1 x daily - 7 x weekly - 2 sets - 10 reps - 5 hold - Seated Gluteal Sets  - 1 x daily - 7 x weekly - 2 sets - 10 reps - Hooklying Single Knee to Chest Stretch  - 1 x daily - 7 x weekly - 2 sets - 2 reps - 30 seconds hold - Active Straight Leg Raise with Quad Set  - 1 x daily - 7 x weekly - 2 sets - 10 reps - 3 hold - Seated Long Arc Quad  - 1 x  daily - 7 x weekly - 2 sets - 10 reps - 3 hold  ASSESSMENT:  CLINICAL IMPRESSION:  Pt saw MD who released her until she is ready to have her right hip replaced. She arrives with RW and reports that she walks in her home without AD, occasionally uses SPC indoor. Worked on gait with SPC in clinic with good safety but with mod cues for correct sequencing. Pt to bring her SPC to next visit for practice. Her hip  strength has improved for flexion and abduction, measured in sitting. STG# 2 met. Her 5 x STS has improved but with continued use of UE assist. Overall she is making progress toward all remaining LTGs. Pt tolerated PT today without adverse effects. Pt will continue to benefit from skilled PT to address impairments for improved L hip/LE function.   EVAL: Patient is a 77 y.o. F who was seen today for physical therapy evaluation and treatment for s/p L THA on 05/16/23.Patient exhibits limited hip flexion/ abduction AROM secondary to weakness, and limited hip extension secondary to anterior stiffness. MMT reveals gross L hip weakness, pt was unable to perform sit to stand without UE assist and completed 5 x STS using bil UE in 20 seconds and a TUG with RW at 31 seconds indicating higher risk of falling. Patient would benefit from physical therapy to promote hip mobility, increase strength and stability, maximize her function by addressing the deficits listed.   OBJECTIVE IMPAIRMENTS: decreased activity tolerance, decreased balance, decreased endurance, decreased ROM, decreased strength, impaired flexibility, postural dysfunction, and pain.   ACTIVITY LIMITATIONS: carrying, lifting, standing, squatting, stairs, and locomotion level  PARTICIPATION LIMITATIONS: shopping and community activity  PERSONAL FACTORS: Age and 1-2 comorbidities: CX, CHF  are also affecting patient's functional outcome.   REHAB POTENTIAL: Good  CLINICAL DECISION MAKING: Evolving/moderate complexity  EVALUATION COMPLEXITY: Moderate   GOALS: Goals reviewed with patient? Yes  SHORT TERM GOALS: Target date: 07/20/2023   Pt to be IND with initial HEP for therapeutic progression Baseline: no previous HEP 06/17/23: min verbal cueing Goal status: ONGOING  2.  Pt to increase L hip flexor/ abductor strength to 3+/5 to promote hip stability  Baseline: see flow sheet 07/05/23: 4-4-/5 Goal status: MET  3.  Increase L hip  flexion to >/= 90 degrees  Baseline: see flowsheet 06/23/23: 100 Goal status: MET  4.  Pt to be able to perform 5 x STS with </= 20 seconds with minimal to no UE assistance Baseline: 20 seconds with significant UE use 07/05/23: needs UE to rise Goal status: ONGOING  LONG TERM GOALS: Target date: 08/31/2023   Improve L gross hip strength to >/= 4/5 to promote stability and safety with walking/ standing  Baseline: see flow sheet Goal status: INITIAL  2.  Pt to improve TUG to </= 18 seconds with LRAD to demonstrate improvement in mobility and function Baseline: initial score 31 seconds with 31 seconds Goal status: INITIAL  3.  Pt to be able to walk/ standing with LRAD for >/= 45 min for endurance required ADLS and community ambulation  Baseline:  Goal status: INITIAL  4.  Pt to be able to perform 5 x STS in </= 15 seconds with no UE use for improvement in function. Baseline:  Goal status: INITIAL  5.  Pt to be IND with all HEP and is able to maintain and progress her current LOF IND.  Baseline:  Goal status: INITIAL  PLAN:  PT FREQUENCY: 1-2x/week  PT DURATION: 12  weeks  PLANNED INTERVENTIONS: 97164- PT Re-evaluation, 97110-Therapeutic exercises, 97530- Therapeutic activity, [id number]- Neuromuscular re-education, 97535- Self Care, [id number]- Manual therapy, [id number]- Gait training, [phone]- Aquatic Therapy, Stair training, Dry Needling, Joint mobilization, Cryotherapy, and Moist heat  PLAN FOR NEXT SESSION: Review/ update HEP PRN.    Jannette Spanner, PTA 07/05/23 2:04 PM Phone: [phone] Fax: [phone]

## 2023-07-06 ENCOUNTER — Other Ambulatory Visit (HOSPITAL_COMMUNITY): Payer: Self-pay | Admitting: Family Medicine

## 2023-07-07 ENCOUNTER — Other Ambulatory Visit: Payer: Self-pay

## 2023-07-07 ENCOUNTER — Ambulatory Visit: Payer: 59 | Admitting: Physical Therapy

## 2023-07-07 ENCOUNTER — Encounter: Payer: Self-pay | Admitting: Physical Therapy

## 2023-07-07 DIAGNOSIS — R2689 Other abnormalities of gait and mobility: Secondary | ICD-10-CM

## 2023-07-07 DIAGNOSIS — M25552 Pain in left hip: Secondary | ICD-10-CM | POA: Diagnosis not present

## 2023-07-07 DIAGNOSIS — M6281 Muscle weakness (generalized): Secondary | ICD-10-CM

## 2023-07-07 NOTE — Therapy (Signed)
OUTPATIENT PHYSICAL THERAPY LOWER EXTREMITY TREATMENT   Patient Name: ZAKARIAH PARNES MRN: [id number] DOB:January 20, 1946, 77 y.o., female Today's Date: 07/07/2023  END OF SESSION:  PT End of Session - 07/07/23 1314     Visit Number 7    Number of Visits 24    Date for PT Re-Evaluation 08/31/23    Authorization Type [employer] MCR    PT Start Time 1315    PT Stop Time 1355    PT Time Calculation (min) 40 min               Past Medical History:  Diagnosis Date   Amyloid heart disease (HCC)    Arthritis    Cancer (HCC)    kidney cancer   Chronic combined systolic and diastolic CHF (congestive heart failure) (HCC)    CKD (chronic kidney disease), stage III (HCC)    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 s/p right nephrectomy ~2007.   History of loop recorder    Hypercholesterolemia    Hypertension    Hypothyroidism    Mitral regurgitation    Pulmonary hypertension (HCC)    Stroke (HCC)    no residual   Past Surgical History:  Procedure Laterality Date   BREAST EXCISIONAL BIOPSY Left    BREAST EXCISIONAL BIOPSY Right    CARDIAC CATHETERIZATION N/A 01/12/2016   Procedure: Right/Left Heart Cath and Coronary/Graft Angiography;  Surgeon: Lyn Records, MD;  Location: [facility] INVASIVE CV LAB;  Service: Cardiovascular;  Laterality: N/A;   CERVICAL POLYPECTOMY N/A 04/23/2013   Procedure: CERVICAL POLYPECTOMY;  Surgeon: Dorien Chihuahua. Richardson Dopp, MD;  Location: [facility];  Service: Gynecology;  Laterality: N/A;  Possible Polypectomy   COLONOSCOPY WITH PROPOFOL N/A 12/19/2020   Procedure: COLONOSCOPY WITH PROPOFOL;  Surgeon: Jeani Hawking, MD;  Location: [facility];  Service: Endoscopy;  Laterality: N/A;   HYSTEROSCOPY WITH D & C N/A 04/23/2013   Procedure: DILATATION AND CURETTAGE /HYSTEROSCOPY;  Surgeon: Dorien Chihuahua. Richardson Dopp, MD;  Location: [facility];  Service: Gynecology;  Laterality: N/A;   IR ANGIO INTRA EXTRACRAN SEL COM CAROTID INNOMINATE BILAT MOD SED   02/11/2017   IR ANGIO VERTEBRAL SEL VERTEBRAL BILAT MOD SED  02/11/2017   KNEE ARTHROSCOPY Left    LOOP RECORDER INSERTION N/A 02/18/2017   Procedure: Loop Recorder Insertion;  Surgeon: Regan Lemming, MD;  Location: [facility] INVASIVE CV LAB;  Service: Cardiovascular;  Laterality: N/A;   MITRAL VALVE REPAIR  11/30/2017   NEPHRECTOMY Right    due to carcinoma   POLYPECTOMY  12/19/2020   Procedure: POLYPECTOMY;  Surgeon: Jeani Hawking, MD;  Location: [facility];  Service: Endoscopy;;   TEE WITHOUT CARDIOVERSION N/A 02/18/2017   Procedure: TRANSESOPHAGEAL ECHOCARDIOGRAM (TEE) WITH LOOP;  Surgeon: Vesta Mixer, MD;  Location: [facility] ENDOSCOPY;  Service: Cardiovascular;  Laterality: N/A;   TOTAL HIP ARTHROPLASTY Left 05/16/2023   Procedure: TOTAL HIP ARTHROPLASTY ANTERIOR APPROACH;  Surgeon: Jodi Geralds, MD;  Location: [facility];  Service: Orthopedics;  Laterality: Left;   TRANSCATHETER MITRAL EDGE TO EDGE REPAIR N/A 11/30/2017   Procedure: MITRAL VALVE REPAIR;  Surgeon: Tonny Bollman, MD;  Location: [facility] INVASIVE CV LAB;  Service: Cardiovascular;  Laterality: N/A;   Patient Active Problem List   Diagnosis Date Noted   Hypotension 05/18/2023   Primary osteoarthritis of left hip 05/17/2023   H/O total hip arthroplasty, left 05/16/2023   Diverticular disease of colon 03/03/2021  Family history of malignant neoplasm of digestive organs 03/03/2021   Shortness of breath 03/03/2021   Systolic heart failure (HCC) 03/03/2021   Rhabdomyolysis 06/06/2020   Acute kidney injury superimposed on chronic kidney disease (HCC) 06/05/2020   Hypothyroidism    Acidosis    History of stroke 02/06/2018   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/2017   Severe mitral regurgitation 11/30/2017   Amyloid heart disease (HCC)    Myopericarditis 09/06/2017   Non-rheumatic mitral regurgitation    Chronic diastolic congestive heart failure (HCC) 05/08/2017   CHF exacerbation  (HCC) 05/08/2017   Chronic migraine without aura without status migrainosus, not intractable    Hypertension 02/15/2017   Basilar artery occlusion    Cerebellar stroke (HCC) 02/10/2017   Headache 02/10/2017   Chest pain    Mitral regurgitation 01/08/2016   Moderate tricuspid regurgitation 01/08/2016   Pulmonary HTN (HCC) 01/08/2016   Acute CHF (congestive heart failure) (HCC) 01/07/2016   Elevated transaminase level 01/07/2016   Mild hypertension 01/07/2016   CKD (chronic kidney disease), stage III (HCC) 01/07/2016   Hyperlipidemia 01/07/2016   Acute CHF (HCC) 01/07/2016    PCP: Renford Dills, MD  REFERRING PROVIDER: Jodi Geralds, MD  REFERRING DIAG: S/P L THA Ant approach  THERAPY DIAG:  Pain in left hip  Muscle weakness (generalized)  Other abnormalities of gait and mobility  Rationale for Evaluation and Treatment: Rehabilitation  ONSET DATE: DOS 05/16/2023  SUBJECTIVE:   SUBJECTIVE STATEMENT: I could not bring the cane and the walker. The cane was too long to stay on my walker.    EVAL: Since the surgery everything is going good. She reported having home health PT for 3 visits. She reports overall everything is going pretty good having occasional pain that occurs on its that goes up to a 5 /10. Currently uses a RW at home and previously before the surgery would occasionally use a SPC.   PERTINENT HISTORY: Kidney Cx, CHF, See above PAIN:  Are you having pain? Yes: NPRS scale: 0/10, at worst 0/10 in the last 24 hours Pain location: in the the front Pain description: sore aching Aggravating factors: unsure Relieving factors: take medication,   PRECAUTIONS: None  RED FLAGS: None   WEIGHT BEARING RESTRICTIONS: No  FALLS:  Has patient fallen in last 6 months? No  LIVING ENVIRONMENT: Lives with: lives with their family and lives alone Lives in: House/apartment Stairs: Yes: External: 4 steps; on right going up Has following equipment at home: Single point  cane, Environmental consultant - 2 wheeled, Marine scientist  OCCUPATION: Retired  PLOF: Independent with basic ADLs  PATIENT GOALS: To get back on my feet and walking.   OBJECTIVE:  Note: Objective measures were completed at Evaluation unless otherwise noted.  DIAGNOSTIC FINDINGS: see chart  PATIENT SURVEYS:  FOTO 79% predicted 76% ( this didn't align with her objective assessment plan to re-capture FOTO assessment on 2nd visit) 06/15/23: 53%, predicted 66%  COGNITION: Overall cognitive status: Within functional limits for tasks assessed     SENSATION: Not tested   POSTURE: rounded shoulders and forward head  PALPATION: TTP along the surgical site/ proximal hip flexor.  Incision site covered by bandage.   LOWER EXTREMITY ROM:  Active ROM Right eval Left eval Left 06/23/23  Hip flexion 80 80 100  Hip extension 5 5   Hip abduction 22 supine 20 Supine   Hip adduction     Hip internal rotation     Hip external  rotation     Knee flexion     Knee extension     Ankle dorsiflexion     Ankle plantarflexion     Ankle inversion     Ankle eversion      (Blank rows = not tested)  LOWER EXTREMITY MMT:  MMT Right eval Left eval Left 07/05/23  Hip flexion 3 3 4   Hip extension 3 3   Hip abduction Seated 3 seated3 4- seated  Hip adduction Seated 4- Seated 4-   Hip internal rotation     Hip external rotation     Knee flexion 4 4   Knee extension 4 4   Ankle dorsiflexion     Ankle plantarflexion     Ankle inversion     Ankle eversion      (Blank rows = not tested)  FUNCTIONAL TESTS:  5 times sit to stand: 20 seconds with significant use of hands pushing up from chair. 07/05/23: 14 sec with UE to rise  Timed up and go (TUG): 31 seconds   GAIT: Distance walked: 85 ft from waiting area to treatment room  Assistive device utilized: Walker - 2 wheeled Level of assistance: Modified independence Comments: decreased stride, flexed trunk   [facility] Adult PT Treatment:                                                 DATE: 07/07/23 Therapeutic Exercise: Nustep L5 UE/LE x 5 minutes  Seated Knee ext 5# 10 x 2  Seated knee flex 20# 10 x 2  Heel raises x 20 Hip abduction 10 x 1 , red band 10 x 1 each  Lateral walking at counter red band at ankles x 4 Standing march 12 x 2  STS x 10 Bridge 10 x 1 Single leg bridge x 10 each  SLR x 10 each  side clam 10 x 2 each Side hip abduction 8 x 2 each SKTC x 30 se left LTR comfortable ROM    [facility] Adult PT Treatment:                                                DATE: 07/05/23 Therapeutic Exercise: Nustep L5 UE/LE x 5 min Bridge 2 x 10 Left SLR x 10 Left side clam 10 x 2  Left hip abduction x12 Single leg bridge x 10 each   Therapeutic Activity: 5 x STS 14 sec needs UE to rise.  Gait with SPC, SBA - mod cues for sequencing    [facility] Adult PT Treatment:                                                DATE: 06/23/23 Therapeutic Exercise: Nustep L5 UE/LE x 6 minutes  Heel raises x 20 Standing hip abduction 10 x 3  Standing march x 10 each  Standing right hip hikes 10 x 2  Seated LAQ 4# 15 x 3 each Seated march AROM  Ball squeeze 10 x 3  SKTC x 30 se left LTR comfortable ROM  Therapeutic Activity: Gait in parallel bars  with 1 UE Side stepping in // bars x 4  Gait in // bars without UE- focus on equal hip level s   [facility] Adult PT Treatment:                                                DATE: 06/21/23 Therapeutic Exercise: Nustep L3 UE/:E x 6 minutes  Heel raises x 20 Standing hip abdct 10 x 2 each Standing hip flexion x 10 each STS x 5 without UE Supine march Supine clam blue SLR x 12 each    [facility] Adult PT Treatment:                                                DATE: 06/17/23 Therapeutic Exercise: Nustep 5 min L5 UE/LE LAQ 2x10 3# STS c use of hands 5x Hooklying Single Knee to Chest 2 reps - 30 seconds hold SLR c QS 2x10. Quad lag not present Supine Bridge 2x10 reps Hooklying  Clamshell c BluTB 2x15 reps Supine March 15 reps  Standing hip abd 2x10 each Heel raise/toe lifts 2x10 Therapeutic Activity: Gait training for ambulation with c SPC 90' x 2 and SBA for safety  [facility] Adult PT Treatment:                                                DATE: 06/15/23 Therapeutic Exercise: SLR c QS x10. Quad lag not present Supine Bridge 15 reps Hooklying Clamshell c BluTB 15 reps Supine March 10 reps  Seated Gluteal Sets 15 reps LAQ L x15 3" Hooklying Single Knee to Chest 2 reps - 30 seconds hold Updated HEP Therapeutic Activity: Gait training in // bars to mimic use of SPC c 1 bar using R hand; then c a SPC c SBA was provided for safety due to L LE weakness. Kandis Mannan                                                                                                                       [facility] Adult PT Treatment:                                                DATE: 06/08/2023 Therapeutic Exercise: Hooklying clamshell 2 x 10 with RTB Bridge 2 x 10 SKTC 1 x 30 L Supine marching 1 x 20 alternating L/R  Provided initial HEP  PATIENT EDUCATION:  Education details: evaluation findings, POC, goals, HEP with proper form/  rationale.  Person educated: Patient Education method: Explanation, Verbal cues, and Handouts Education comprehension: verbalized understanding  HOME EXERCISE PROGRAM: Access Code: [email or web address] URL: [email or web address] Date: 06/15/2023 Prepared by: Joellyn Rued  Exercises - Supine Bridge  - 1 x daily - 7 x weekly - 2 sets - 10 reps - Hooklying Clamshell with Resistance  - 1 x daily - 7 x weekly - 2 sets - 15 reps - Supine March  - 1 x daily - 7 x weekly - 2 sets - 10 reps - 5 hold - Seated Gluteal Sets  - 1 x daily - 7 x weekly - 2 sets - 10 reps - Hooklying Single Knee to Chest Stretch  - 1 x daily - 7 x weekly - 2 sets - 2 reps - 30 seconds hold - Active Straight Leg Raise with Quad Set  - 1 x daily - 7 x weekly - 2 sets - 10 reps - 3  hold - Seated Long Arc Quad  - 1 x daily - 7 x weekly - 2 sets - 10 reps - 3 hold  ASSESSMENT:  CLINICAL IMPRESSION: Pt reports she could not bring both SPC and RW so she just brought her RW. Continued with LE strengthening focus today, progressing to gym machines for quad and hamstrings. She demonstrates bilateral hip abduction weakness in side lying, fatigues quickly. She was able to complete STS transfers from standard mat without UE support today. Overall she is making progress toward all remaining LTGs. Pt tolerated PT today without adverse effects. Pt will continue to benefit from skilled PT to address impairments for improved L hip/LE function.   EVAL: Patient is a 77 y.o. F who was seen today for physical therapy evaluation and treatment for s/p L THA on 05/16/23.Patient exhibits limited hip flexion/ abduction AROM secondary to weakness, and limited hip extension secondary to anterior stiffness. MMT reveals gross L hip weakness, pt was unable to perform sit to stand without UE assist and completed 5 x STS using bil UE in 20 seconds and a TUG with RW at 31 seconds indicating higher risk of falling. Patient would benefit from physical therapy to promote hip mobility, increase strength and stability, maximize her function by addressing the deficits listed.   OBJECTIVE IMPAIRMENTS: decreased activity tolerance, decreased balance, decreased endurance, decreased ROM, decreased strength, impaired flexibility, postural dysfunction, and pain.   ACTIVITY LIMITATIONS: carrying, lifting, standing, squatting, stairs, and locomotion level  PARTICIPATION LIMITATIONS: shopping and community activity  PERSONAL FACTORS: Age and 1-2 comorbidities: CX, CHF  are also affecting patient's functional outcome.   REHAB POTENTIAL: Good  CLINICAL DECISION MAKING: Evolving/moderate complexity  EVALUATION COMPLEXITY: Moderate   GOALS: Goals reviewed with patient? Yes  SHORT TERM GOALS: Target date:  07/20/2023   Pt to be IND with initial HEP for therapeutic progression Baseline: no previous HEP 06/17/23: min verbal cueing Goal status: ONGOING  2.  Pt to increase L hip flexor/ abductor strength to 3+/5 to promote hip stability  Baseline: see flow sheet 07/05/23: 4-4-/5 Goal status: MET  3.  Increase L hip flexion to >/= 90 degrees  Baseline: see flowsheet 06/23/23: 100 Goal status: MET  4.  Pt to be able to perform 5 x STS with </= 20 seconds with minimal to no UE assistance Baseline: 20 seconds with significant UE use 07/05/23: needs UE to rise standard chair Goal status: ONGOING  LONG TERM GOALS: Target date: 08/31/2023   Improve L gross hip strength to >/= 4/5 to promote stability  and safety with walking/ standing  Baseline: see flow sheet Goal status: INITIAL  2.  Pt to improve TUG to </= 18 seconds with LRAD to demonstrate improvement in mobility and function Baseline: initial score 31 seconds with 31 seconds Goal status: INITIAL  3.  Pt to be able to walk/ standing with LRAD for >/= 45 min for endurance required ADLS and community ambulation  Baseline:  Goal status: INITIAL  4.  Pt to be able to perform 5 x STS in </= 15 seconds with no UE use for improvement in function. Baseline:  Goal status: INITIAL  5.  Pt to be IND with all HEP and is able to maintain and progress her current LOF IND.  Baseline:  Goal status: INITIAL  PLAN:  PT FREQUENCY: 1-2x/week  PT DURATION: 12 weeks  PLANNED INTERVENTIONS: 97164- PT Re-evaluation, 97110-Therapeutic exercises, 97530- Therapeutic activity, [id number]- Neuromuscular re-education, 97535- Self Care, 64332- Manual therapy, [id number]- Gait training, [phone]- Aquatic Therapy, Stair training, Dry Needling, Joint mobilization, Cryotherapy, and Moist heat  PLAN FOR NEXT SESSION: Review/ update HEP PRN.    Jannette Spanner, PTA 07/07/23 1:57 PM Phone: [phone] Fax: [phone]

## 2023-07-11 ENCOUNTER — Ambulatory Visit (HOSPITAL_COMMUNITY)
Admission: RE | Admit: 2023-07-11 | Discharge: 2023-07-11 | Disposition: A | Discharge status: Home / Self Care | Payer: 59 | Source: Ambulatory Visit | Attending: Family Medicine

## 2023-07-11 ENCOUNTER — Other Ambulatory Visit (HOSPITAL_COMMUNITY): Payer: Self-pay

## 2023-07-11 ENCOUNTER — Encounter (HOSPITAL_COMMUNITY): Payer: Self-pay

## 2023-07-11 ENCOUNTER — Other Ambulatory Visit: Payer: Self-pay

## 2023-07-11 ENCOUNTER — Telehealth (HOSPITAL_COMMUNITY): Payer: Self-pay | Admitting: Pharmacist

## 2023-07-11 VITALS — BP 120/74 | HR 78 | Wt 156.2 lb

## 2023-07-11 DIAGNOSIS — G990 Autonomic neuropathy in diseases classified elsewhere: Secondary | ICD-10-CM | POA: Diagnosis not present

## 2023-07-11 DIAGNOSIS — G63 Polyneuropathy in diseases classified elsewhere: Secondary | ICD-10-CM | POA: Diagnosis not present

## 2023-07-11 DIAGNOSIS — E782 Mixed hyperlipidemia: Secondary | ICD-10-CM

## 2023-07-11 DIAGNOSIS — Z79899 Other long term (current) drug therapy: Secondary | ICD-10-CM | POA: Diagnosis not present

## 2023-07-11 DIAGNOSIS — E049 Nontoxic goiter, unspecified: Secondary | ICD-10-CM

## 2023-07-11 DIAGNOSIS — G5603 Carpal tunnel syndrome, bilateral upper limbs: Secondary | ICD-10-CM | POA: Diagnosis not present

## 2023-07-11 DIAGNOSIS — E854 Organ-limited amyloidosis: Secondary | ICD-10-CM

## 2023-07-11 DIAGNOSIS — E039 Hypothyroidism, unspecified: Secondary | ICD-10-CM | POA: Diagnosis not present

## 2023-07-11 DIAGNOSIS — I5032 Chronic diastolic (congestive) heart failure: Secondary | ICD-10-CM | POA: Diagnosis not present

## 2023-07-11 DIAGNOSIS — Z7902 Long term (current) use of antithrombotics/antiplatelets: Secondary | ICD-10-CM | POA: Diagnosis not present

## 2023-07-11 DIAGNOSIS — M1990 Unspecified osteoarthritis, unspecified site: Secondary | ICD-10-CM | POA: Insufficient documentation

## 2023-07-11 DIAGNOSIS — D472 Monoclonal gammopathy: Secondary | ICD-10-CM

## 2023-07-11 DIAGNOSIS — I340 Nonrheumatic mitral (valve) insufficiency: Secondary | ICD-10-CM

## 2023-07-11 DIAGNOSIS — Z8673 Personal history of transient ischemic attack (TIA), and cerebral infarction without residual deficits: Secondary | ICD-10-CM | POA: Diagnosis not present

## 2023-07-11 DIAGNOSIS — I130 Hypertensive heart and chronic kidney disease with heart failure and stage 1 through stage 4 chronic kidney disease, or unspecified chronic kidney disease: Secondary | ICD-10-CM | POA: Diagnosis not present

## 2023-07-11 DIAGNOSIS — E851 Neuropathic heredofamilial amyloidosis: Secondary | ICD-10-CM

## 2023-07-11 DIAGNOSIS — Z823 Family history of stroke: Secondary | ICD-10-CM | POA: Diagnosis not present

## 2023-07-11 DIAGNOSIS — Z79890 Hormone replacement therapy (postmenopausal): Secondary | ICD-10-CM | POA: Insufficient documentation

## 2023-07-11 DIAGNOSIS — G629 Polyneuropathy, unspecified: Secondary | ICD-10-CM | POA: Insufficient documentation

## 2023-07-11 DIAGNOSIS — M1610 Unilateral primary osteoarthritis, unspecified hip: Secondary | ICD-10-CM | POA: Diagnosis not present

## 2023-07-11 DIAGNOSIS — N183 Chronic kidney disease, stage 3 unspecified: Secondary | ICD-10-CM

## 2023-07-11 DIAGNOSIS — E785 Hyperlipidemia, unspecified: Secondary | ICD-10-CM | POA: Insufficient documentation

## 2023-07-11 LAB — BASIC METABOLIC PANEL
Anion gap: 10 (ref 5–15)
BUN: 28 mg/dL — ABNORMAL HIGH (ref 8–23)
CO2: 24 mmol/L (ref 22–32)
Calcium: 9.4 mg/dL (ref 8.9–10.3)
Chloride: 104 mmol/L (ref 98–111)
Creatinine, Ser: 2.22 mg/dL — ABNORMAL HIGH (ref 0.44–1.00)
GFR, Estimated: 22 mL/min — ABNORMAL LOW (ref >60)
Glucose, Bld: 85 mg/dL (ref 70–99)
Potassium: 4.1 mmol/L (ref 3.5–5.1)
Sodium: 138 mmol/L (ref 135–145)

## 2023-07-11 LAB — LIPID PANEL
Cholesterol: 131 mg/dL (ref 0–200)
HDL: 54 mg/dL (ref >40)
LDL Cholesterol: 62 mg/dL (ref 0–99)
Total CHOL/HDL Ratio: 2.4 {ratio}
Triglycerides: 74 mg/dL (ref <150)
VLDL: 15 mg/dL (ref 0–40)

## 2023-07-11 LAB — BRAIN NATRIURETIC PEPTIDE: B Natriuretic Peptide: 154.3 pg/mL — ABNORMAL HIGH (ref 0.0–100.0)

## 2023-07-11 MED ORDER — VUTRISIRAN SODIUM 25 MG/0.5ML ~~LOC~~ SOSY
25.0000 mg | PREFILLED_SYRINGE | Freq: Once | SUBCUTANEOUS | Status: AC
  Administered 2023-07-11: 25 mg via SUBCUTANEOUS

## 2023-07-11 NOTE — Patient Instructions (Signed)
Medication Changes:  No Changes In Medications at this time.   Lab Work:  Labs done today, your results will be available in MyChart, we will contact you for abnormal readings.  Follow-Up in: 3-4 months with Dr. Shirlee Latch PLEASE CALL OUR OFFICE AROUND JANUARY TO GET SCHEDULED FOR YOUR APPOINTMENT. PHONE NUMBER IS [phone] OPTION 2   At the [facility], you and your health needs are our priority. We have a designated team specialized in the treatment of Heart Failure. This Care Team includes your primary Heart Failure Specialized Cardiologist (physician), Advanced Practice Providers (APPs- Physician Assistants and Nurse Practitioners), and Pharmacist who all work together to provide you with the care you need, when you need it.   You may see any of the following providers on your designated Care Team at your next follow up:  Dr. Arvilla Meres Dr. Marca Ancona Dr. Dorthula Nettles Dr. Theresia Bough Tonye Becket, NP Robbie Lis, [place] [facility] [place], [place] Brynda Peon, NP [place] Lee, NP Karle Plumber, PharmD   Please be sure to bring in all your medications bottles to every appointment.   Need to Contact us:  If you have any questions or concerns before your next appointment please send [place] a message through [place] or call our office at [phone].    TO LEAVE A MESSAGE FOR THE NURSE SELECT OPTION 2, PLEASE LEAVE A MESSAGE INCLUDING: YOUR NAME DATE OF BIRTH CALL BACK NUMBER REASON FOR CALL**this is important as we prioritize the call backs  YOU WILL RECEIVE A CALL BACK THE SAME DAY AS LONG AS YOU CALL BEFORE 4:00 PM

## 2023-07-11 NOTE — Telephone Encounter (Signed)
Patient Advocate Encounter   Received notification from [employer] that prior authorization for Amvuttra is required.   PA submitted on CoverMyMeds Key [id number] Status is pending   Will continue to follow.  Karle Plumber, PharmD, BCPS, BCCP, CPP [facility] Pharmacist [phone]

## 2023-07-11 NOTE — Telephone Encounter (Signed)
[facility] Patient Advocate Encounter  Prior Authorization for Amvuttra was canceled. Notice indicates approved from 08/03/23-08/01/24  Karle Plumber, PharmD, BCPS, BCCP, CPP [facility] Pharmacist [phone]

## 2023-07-11 NOTE — Progress Notes (Signed)
Amvuttra (vutrisiran) injection administered in clinic visit today with [facility], FNP-BC. Amvuttra given in Left arm. NDC [address]-6; Lot [id number]; exp date 01/2025   Patient tolerated injection well. Repeat injection scheduled for 10/10/2022.  Karle Plumber, PharmD, BCPS, BCCP, CPP [facility] Pharmacist [phone]

## 2023-07-11 NOTE — Progress Notes (Signed)
PCP: Dr. Nehemiah Settle HF Cardiology: Dr. Shirlee Latch  77 y.o. with history of chronic diastolic CHF, mitral regurgitation, CVA, and CKD returns for followup of CHF.   Patient was initially admitted with diastolic CHF in 6/17. Echo showed EF 60-65% with grade 3 diastolic dysfunction and moderate MR.  She had cath showing no coronary disease. 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  TEE this admission showed moderate-severe MR.  She was admitted again in 10/18 with acute on chronic diastolic CHF. Echo this admission showed EF 50-55%, severe LVH, restrictive diastolic dysfunction, severe MR, moderate-severe TR.  She was referred to Dr. Cornelius Moras who saw her for mitral valve evaluation.  Upon review of studies at that time, regurgitation thought to be more in the moderate range and likely functional.   Patient had PYP amyloid scan in 12/18, this was suggestive of transthyretin amyloidosis.  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   Admission for A/C diastolic HF 2/6-2/05/2018. Required IV diuresis, then transitioned to 80 mg PO lasix BID. Metop was DC'd due to low blood pressures. Cardiac MRI strongly suggestive of amyloidosis, moderate to severe MR (moderate by regurgitant fraction 41% but severe visually). Echo repeated: EF 40-45%, moderate to severe LVH, severe MR, severe dilation of LA and RA, moderate TR, PASP 49.  She had Mitraclip x 2 placed in 5/19.  Repeat echo post-mitraclip showed EF 55-60%, moderate LVH, mean gradient 3 mmHg across the MV with trace-mild MR.   Cardiomems was placed 6/19.    Echo 6/20 showed EF 55% with moderate LVH, s/p Mitraclip with mean gradient 3 mmHg, mild MR, normal RV, PASP 41 mmHg.   Echo in 11/21 showed EF 50-55%, moderate LVH, normal RV, PASP 44 mmHg, s/p Mitraclip with mean gradient 5 mmHg with mild MR.   Echo 2/23 showed EF 55-60%, mild LVH, mild to moderate MR post Mitraclip.  Echo in 3/24 with EF 55%, mild LVH,  normal RV, PASP 41 mmHg, s/p Mitraclip with mild-moderate MR (mean gradient 3 mmHg).   S/p L hip arthroplasty 10/24.  Today she returns for HF/amyloidosis follow up. Overall feeling fine. She is doing well with outpatient PT, s/p left hip replacement. Main complaint is R shoulder pain. On-going issue, pain worse in AM. She is not SOB with activity, walks with RW due to chronic hip pain. Considering have right hip replaced. Denies palpitations, CP, dizziness, edema, or PND/Orthopnea. Appetite ok. No fever or chills. Weight at home 150-152 pounds. Taking all medications.   ECG (personally reviewed): NSR 66 bpm  Cardiomems PADP 15 (goal 19).   Labs (11/18): K 3.9, creatinine 1.43, SPEP negative Labs (2/19): K 3.8, creatinine 1.40, hgb 13.4 Labs (5/19): K 4.1, creatinine 1.4, hgb 10.8 Labs (6/19): K 4.2, creatinine 1.4, hgb 12.4 Labs (7/19): K 4, creatinine 1.38 Labs (1/20): K 3.6, creatinine 1.36 Labs (3/20): K 3.6, creatinine 1.31, LDL 107 Labs (7/20): creatinine 1.48 Labs (10/20): LDL 87 Labs (12/21): LDL 127, BNP 485 Labs (1/22): K 4.6, creatinine 1.65 Labs (6/22): K 4.1, creatinine 1.67, LDL 142 Labs (11/22): LDL 409 (was not taking Repatha), K 4.2, creatinine 1.64 Labs (2/23): K 4.0, creatinine 1.68, LDL 110, HDL 55 Labs (6/23): K 4, creatinine 1.71, LDL 103 Labs (9/23): K 4.0, creatinine 1.65 Labs (12/23): BNP 133, K 3.8, creatinine 1.71 Labs (3/24): LDL 126, BNP 151, K 4, creatinine 1.86 Labs (10/24): K 3.4, creatinine 1.97  PMH: 1. HTN 2. Hyperlipidemia 3. 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  LINQ monitor placed.  4. Chronic diastolic CHF: She has Cardiomems.  - LHC (6/17): Normal coronaries.  - Echo (10/18): EF 50-55%, severe LVH with speckled myocardium concerning for amyloidosis, restrictive diastolic function with septal flattening, severe MR, moderate-severe TR, severe biatrial enlargement, PASP 54 mmHg.  - PYP amyloid scan: Grade 3, suggestive of transthyretin  amyloidosis.  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   - Echo (5/19): EF 55-60%, moderate LVH c/w amyloidosis, Mitraclip x 2 with mean MV gradient 3 mmHg, trace-mild MR, moderate TR, PASP 62 mmHg.  - Echo (6/19): EF 55-60%, moderate LVH with speckled myocardium, trivial MR s/p Mitraclip with mean gradient 2 mmHg, PASP 61.  - Echo (6/20): EF 55% with moderate LVH, s/p Mitraclip with mean gradient 3 mmHg, mild MR, normal RV, PASP 41 mmHg.  - Echo (11/21): EF 50-55%, moderate LVH, normal RV, PASP 44 mmHg, s/p Mitraclip with mean gradient 5 mmHg with mild MR. - Echo (2/23): EF 55-60%, mild LVH, mild to moderate MR post Mitraclip. - Echo (3/24):  EF 55%, mild LVH, normal RV, PASP 41 mmHg, s/p Mitraclip with mild-moderate MR (mean gradient 3 mmHg).  5. Mitral regurgitation: Think mixed functional/degenerative - TEE (7/18): EF 45-50%, moderate-severe MR, mild-moderate TR.  - Echo (10/18) read as severe MR.  - 2/19 echo with severe MR.  - 2/19 cardiac MRI with moderate to severe MR.  - Mitraclip x 2 in 5/19.   - Echo 5/19 post-Mitraclip with mean MV gradient 3 mmHg, trace-mild MR.  - Echo 6/20 post-Mitraclip with mean MV gradient 3 mmHg, mild MR.  - Echo 11/21 post-Mitraclip with mean MV gradient 5 mmHg, mild MR - Echo 2/23 EF 55-60%, mild LVH, mild to moderate MR post Mitraclip. - Echo 3/24 s/p Mitraclip with mild-moderate MR and mean gradient 3 mmHg.  6. CKD stage 3 7. Hypothyroidism 8. H/o renal cell CA s/p nephrectomy.  9. Bilateral carpal tunnel releases.  10. ABIs (6/20):  Normal 11. OA: s/p left hip arthroplasty 10/24 12. Dementia: Mild.   Social History   Socioeconomic History   Marital status: Divorced    Spouse name: Not on file   Number of children: 5   Years of education: 12   Highest education level: Not on file  Occupational History   Occupation: retired    Associate Professor: [employer]  Tobacco Use   Smoking status: Never   Smokeless tobacco: Never  Vaping Use   Vaping status: Never Used  Substance and Sexual Activity   Alcohol use: No   Drug use: No   Sexual activity: Not Currently  Other Topics Concern   Not on file  Social History Narrative   Lives alone in a one story home.  Her son stays with her some.  Has 5 children, 10 grandchildren and 10 great grandchildren.  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  Education: high school.        07/14/18- list food insecurity but is aware of food pantries and gets $15 in food stamps, reports issues with medical bills but has insurance and gets extra help through medicaid for premiums- encouraged  pt to call billing to set up reasonable payment plan so it doesn't go to collections.      Left Handed   Social Determinants of Health   Financial Resource Strain: Low Risk  (09/04/2019)   Overall Financial Resource Strain (CARDIA)    Difficulty of Paying Living Expenses: Not hard at all  Food Insecurity: No Food Insecurity (05/16/2023)   Hunger Vital Sign    Worried About Running Out of Food in the Last Year: Never true    Ran Out of Food in the Last Year: Never true  Transportation Needs: No Transportation Needs (05/16/2023)   PRAPARE - Administrator, Civil Service (Medical): No    Lack of Transportation (Non-Medical): No  Physical Activity: Not on file  Stress: Not on file  Social Connections: Not on file  Intimate Partner Violence: Not At Risk (05/16/2023)   Humiliation, Afraid, Rape, and Kick questionnaire    Fear of Current or Ex-Partner: No    Emotionally Abused: No    Physically  Abused: No    Sexually Abused: No   Family History  Problem Relation Age of Onset   Stroke Brother    Diabetes Mellitus II Brother    Diabetes Mellitus II Sister    Breast cancer Sister    Colon cancer Sister    ROS: All systems reviewed and negative except as per HPI.   Current Outpatient Medications  Medication Sig Dispense Refill   acetaminophen (TYLENOL) 500 MG tablet Take 1,000 mg by mouth daily.     aspirin 81 MG tablet Take 1 tablet (81 mg total) daily by mouth.     Bempedoic Acid-Ezetimibe (NEXLIZET) 180-10 MG TABS Take 1 tablet by mouth daily. (Patient taking differently: Take 1 tablet by mouth every evening.) 90 tablet 3   carvedilol (COREG) 6.25 MG tablet TAKE 1 TABLET(6.25 MG) BY MOUTH TWICE DAILY WITH A MEAL 180 tablet 3   clopidogrel (PLAVIX) 75 MG tablet Take 1 tablet (75 mg total) by mouth daily. 30 tablet 1   empagliflozin (JARDIANCE) 10 MG TABS tablet TAKE 1 TABLET(10 MG) BY MOUTH DAILY BEFORE BREAKFAST 90 tablet 1   Evolocumab (REPATHA SURECLICK) 140 MG/ML SOAJ Inject 140 mg into the skin every 14 (fourteen) days. 6 mL 3   feeding supplement, ENSURE ENLIVE, (ENSURE ENLIVE) LIQD Take 237 mLs by mouth 2 (two) times daily between meals. 237 mL 0   folic acid (FOLVITE) 1 MG tablet Take 1 tablet (1 mg total) by mouth daily. 100 tablet 3   furosemide (LASIX) 20 MG tablet takes 2 tablets in the morning then take 1 tablet by mouth every evening. 90 tablet 1   gabapentin (NEURONTIN) 300 MG capsule Take 300 mg by mouth 2 (two) times daily.     Hypromellose (ARTIFICIAL TEARS OP) Place 1 drop into both eyes 2 (two) times daily.     levothyroxine (SYNTHROID, LEVOTHROID) 25 MCG tablet Take 25 mcg by mouth daily before breakfast.     memantine (NAMENDA) 10 MG tablet Take 1 tablet (10 mg total) by mouth 2 (two) times daily. 60 tablet 3   oxyCODONE-acetaminophen (PERCOCET/ROXICET) 5-325 MG tablet Take 1 tablet by mouth every 4 (four) hours as needed for severe pain. 30 tablet 0    pantoprazole (PROTONIX) 40 MG tablet TAKE 1 TABLET(40 MG) BY MOUTH DAILY 90 tablet 3   potassium chloride SA (KLOR-CON) 20 MEQ tablet Take 1 tablet (20 mEq total) by mouth daily. (Patient taking differently: Take 20 mEq by mouth  2 (two) times daily.) 60 tablet 0   Tafamidis (VYNDAMAX) 61 MG CAPS TAKE 1 CAPSULE BY MOUTH ONCE A DAY 30 capsule 11   tiZANidine (ZANAFLEX) 2 MG tablet Take 2 mg by mouth at bedtime as needed for muscle spasms.     tiZANidine (ZANAFLEX) 2 MG tablet Take 1 tablet (2 mg total) by mouth every 6 (six) hours as needed for muscle spasms. 60 tablet 0   Vitamin A 3 MG (10000 UT) TABS Take 3 mg by mouth daily.     vutrisiran sodium (AMVUTTRA) 25 MG/0.5ML syringe Inject 0.5 mLs (25 mg total) into the skin every 3 (three) months. 0.5 mL 3   No current facility-administered medications for this encounter.   BP 120/74   Pulse 78   Wt 70.9 kg (156 lb 3.2 oz)   LMP 02/12/2013   SpO2 97%   BMI 25.21 kg/m   Wt Readings from Last 3 Encounters:  07/11/23 70.9 kg (156 lb 3.2 oz)  05/16/23 68.9 kg (152 lb)  03/14/23 76.7 kg (169 lb 2 oz)   Physical Exam General:  NAD. No resp difficulty, walked into clinic with rolling walker. HEENT: Normal Neck: Supple. No JVD. Carotids 2+ bilat; no bruits. No lymphadenopathy appreciated, + goiter. Cor: PMI nondisplaced. Regular rate & rhythm. No rubs, gallops or murmurs. Lungs: Clear Abdomen: Soft, nontender, nondistended. No hepatosplenomegaly. No bruits or masses. Good bowel sounds. Extremities: No cyanosis, clubbing, rash, edema Neuro: Alert & oriented x 3, cranial nerves grossly intact. Moves all 4 extremities w/o difficulty. Affect pleasant.  Assessment/Plan: 1. Chronic diastolic CHF: Normal EF on 10/18 echo with severe LVH and speckled myocardium consistent with cardiac amyloidosis.  SPEP was negative. 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 Genetic testing positive, suggesting genetic transthyretin  amyloidosis. Cardiac MRI also strongly suggestive of cardiac amyloidosis. Echo 3/24 showed EF 55%, mild LVH. NYHA class II, limited more by hip OA than dyspnea.  Not volume overloaded by exam or Cardiomems.  - Continue Coreg 6.25 mg bid. - Continue Lasix 40 qam/20 qpm. BMET/BNP today.  - Continue Jardiance 10 mg daily. No GU symptoms. 2. Mitral regurgitation: Mitral regurgitation seems to fluctuate somewhat based on loading conditions.  Most recently, echo in 2/19 suggested severe MR and cardiac MRI suggested moderate to severe MR (moderate by 41% regurgitant fraction but visually severe). Suspect that the etiology is mixed - functional as well as degenerative with thickening and restriction of posterior leaflet. She is now s/p Mitraclip procedure in 5/19. Echo in 11/21 showed mean gradient 5 mmHg across the MV with Mitraclip in place, MR is mild. Echo 3/24 showed mild to moderate MR with mean gradient 3 mmHg, stable. 3. CKD: Stage III.  Continue SGLT2i. BMET today.  4. CVA: 7/18. Has LINQ monitor, no atrial fibrillation detected so far. NSR on ECG today. - Continue ASA 81.   - Continue Plavix. 5. 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  Neuropathic pain has improved with Amvuttra.  - Continue Amvuttra.  6. Cardiac amyloidosis: Hereditary TTR amyloidosis.  She has peripheral neuropathy as well as bilateral carpal tunnel syndrome.   - She is now on tafamidis and Amvuttra, continue.   7. Hyperlipidemia: Goal LDL < 70 with CVA.  Cannot take statins due to rhabdomyolysis with Crestor (interaction with tafamidis). LDL still too high (goal LDL < 55).  - Continue Repatha, Zetia and Nexlizet. Repeat lipids today.  - She is followed  by [facility] 8. Osteoarthritis: s/p left hip arthroplasty.  9. Goiter: Goiter noted on neck exam.  She has hypothyroidism and is on Levoxyl.   Follow up 4 months with Dr. Kathreen Cornfield [facility],  FNP-BC 07/11/2023

## 2023-07-12 ENCOUNTER — Ambulatory Visit: Payer: 59 | Admitting: Physical Therapy

## 2023-07-12 ENCOUNTER — Other Ambulatory Visit: Payer: Self-pay

## 2023-07-12 DIAGNOSIS — M6281 Muscle weakness (generalized): Secondary | ICD-10-CM

## 2023-07-12 DIAGNOSIS — R2689 Other abnormalities of gait and mobility: Secondary | ICD-10-CM | POA: Diagnosis not present

## 2023-07-12 DIAGNOSIS — M25552 Pain in left hip: Secondary | ICD-10-CM

## 2023-07-12 NOTE — Therapy (Signed)
OUTPATIENT PHYSICAL THERAPY LOWER EXTREMITY TREATMENT   Patient Name: Katie [name] MRN: [id number] DOB:1946/05/21, 77 y.o., female Today's Date: 07/12/2023  END OF SESSION:  PT End of Session - 07/12/23 1320     Visit Number 8    Number of Visits 24    Date for PT Re-Evaluation 08/31/23    Authorization Type [employer] MCR    PT Start Time 1315    PT Stop Time 1355    PT Time Calculation (min) 40 min               Past Medical History:  Diagnosis Date   Amyloid heart disease (HCC)    Arthritis    Cancer (HCC)    kidney cancer   Chronic combined systolic and diastolic CHF (congestive heart failure) (HCC)    CKD (chronic kidney disease), stage III (HCC)    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 s/p right nephrectomy ~2007.   History of loop recorder    Hypercholesterolemia    Hypertension    Hypothyroidism    Mitral regurgitation    Pulmonary hypertension (HCC)    Stroke (HCC)    no residual   Past Surgical History:  Procedure Laterality Date   BREAST EXCISIONAL BIOPSY Left    BREAST EXCISIONAL BIOPSY Right    CARDIAC CATHETERIZATION N/A 01/12/2016   Procedure: Right/Left Heart Cath and Coronary/Graft Angiography;  Surgeon: Lyn Records, MD;  Location: [facility] INVASIVE CV LAB;  Service: Cardiovascular;  Laterality: N/A;   CERVICAL POLYPECTOMY N/A 04/23/2013   Procedure: CERVICAL POLYPECTOMY;  Surgeon: Dorien Chihuahua. Richardson Dopp, MD;  Location: [facility];  Service: Gynecology;  Laterality: N/A;  Possible Polypectomy   COLONOSCOPY WITH PROPOFOL N/A 12/19/2020   Procedure: COLONOSCOPY WITH PROPOFOL;  Surgeon: Jeani Hawking, MD;  Location: [facility]OS[facility];  Service: Endoscopy;  Laterality: N/A;   HYSTEROSCOPY WITH D & C N/A 04/23/2013   Procedure: DILATATION AND CURETTAGE /HYSTEROSCOPY;  Surgeon: Dorien Chihuahua. Richardson Dopp, MD;  Location: [facility];  Service: Gynecology;  Laterality: N/A;   IR ANGIO INTRA EXTRACRAN SEL COM CAROTID INNOMINATE BILAT MOD SED   02/11/2017   IR ANGIO VERTEBRAL SEL VERTEBRAL BILAT MOD SED  02/11/2017   KNEE ARTHROSCOPY Left    LOOP RECORDER INSERTION N/A 02/18/2017   Procedure: Loop Recorder Insertion;  Surgeon: Regan Lemming, MD;  Location: [facility] INVASIVE CV LAB;  Service: Cardiovascular;  Laterality: N/A;   MITRAL VALVE REPAIR  11/30/2017   NEPHRECTOMY Right    due to carcinoma   POLYPECTOMY  12/19/2020   Procedure: POLYPECTOMY;  Surgeon: Jeani Hawking, MD;  Location: [facility];  Service: Endoscopy;;   TEE WITHOUT CARDIOVERSION N/A 02/18/2017   Procedure: TRANSESOPHAGEAL ECHOCARDIOGRAM (TEE) WITH LOOP;  Surgeon: Vesta Mixer, MD;  Location: [facility] ENDOSCOPY;  Service: Cardiovascular;  Laterality: N/A;   TOTAL HIP ARTHROPLASTY Left 05/16/2023   Procedure: TOTAL HIP ARTHROPLASTY ANTERIOR APPROACH;  Surgeon: Jodi Geralds, MD;  Location: [facility];  Service: Orthopedics;  Laterality: Left;   TRANSCATHETER MITRAL EDGE TO EDGE REPAIR N/A 11/30/2017   Procedure: MITRAL VALVE REPAIR;  Surgeon: Tonny Bollman, MD;  Location: [facility] INVASIVE CV LAB;  Service: Cardiovascular;  Laterality: N/A;   Patient Active Problem List   Diagnosis Date Noted   Hypotension 05/18/2023   Primary osteoarthritis of left hip 05/17/2023   H/O total hip arthroplasty, left 05/16/2023   Diverticular disease of colon 03/03/2021  Family history of malignant neoplasm of digestive organs 03/03/2021   Shortness of breath 03/03/2021   Systolic heart failure (HCC) 03/03/2021   Rhabdomyolysis 06/06/2020   Acute kidney injury superimposed on chronic kidney disease (HCC) 06/05/2020   Hypothyroidism    Acidosis    History of stroke 02/06/2018   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/2017   Severe mitral regurgitation 11/30/2017   Amyloid heart disease (HCC)    Myopericarditis 09/06/2017   Non-rheumatic mitral regurgitation    Chronic diastolic congestive heart failure (HCC) 05/08/2017   CHF exacerbation  (HCC) 05/08/2017   Chronic migraine without aura without status migrainosus, not intractable    Hypertension 02/15/2017   Basilar artery occlusion    Cerebellar stroke (HCC) 02/10/2017   Headache 02/10/2017   Chest pain    Mitral regurgitation 01/08/2016   Moderate tricuspid regurgitation 01/08/2016   Pulmonary HTN (HCC) 01/08/2016   Acute CHF (congestive heart failure) (HCC) 01/07/2016   Elevated transaminase level 01/07/2016   Mild hypertension 01/07/2016   CKD (chronic kidney disease), stage III (HCC) 01/07/2016   Hyperlipidemia 01/07/2016   Acute CHF (HCC) 01/07/2016    PCP: Renford Dills, MD  REFERRING PROVIDER: Jodi Geralds, MD  REFERRING DIAG: S/P L THA Ant approach  THERAPY DIAG:  Pain in left hip  Muscle weakness (generalized)  Other abnormalities of gait and mobility  Rationale for Evaluation and Treatment: Rehabilitation  ONSET DATE: DOS 05/16/2023  SUBJECTIVE:   SUBJECTIVE STATEMENT: Been practicing with the cane in the house, trying to get the sequencing right. Hip is fine.    EVAL: Since the surgery everything is going good. She reported having home health PT for 3 visits. She reports overall everything is going pretty good having occasional pain that occurs on its that goes up to a 5 /10. Currently uses a RW at home and previously before the surgery would occasionally use a SPC.   PERTINENT HISTORY: Kidney Cx, CHF, See above PAIN:  Are you having pain? Yes: NPRS scale: 0/10, at worst 0/10 in the last 24 hours Pain location: in the the front Pain description: sore aching Aggravating factors: unsure Relieving factors: take medication,   PRECAUTIONS: None  RED FLAGS: None   WEIGHT BEARING RESTRICTIONS: No  FALLS:  Has patient fallen in last 6 months? No  LIVING ENVIRONMENT: Lives with: lives with their family and lives alone Lives in: House/apartment Stairs: Yes: External: 4 steps; on right going up Has following equipment at home: Single  point cane, Environmental consultant - 2 wheeled, Marine scientist  OCCUPATION: Retired  PLOF: Independent with basic ADLs  PATIENT GOALS: To get back on my feet and walking.   OBJECTIVE:  Note: Objective measures were completed at Evaluation unless otherwise noted.  DIAGNOSTIC FINDINGS: see chart  PATIENT SURVEYS:  FOTO 79% predicted 76% ( this didn't align with her objective assessment plan to re-capture FOTO assessment on 2nd visit) 06/15/23: 53%, predicted 66%  COGNITION: Overall cognitive status: Within functional limits for tasks assessed     SENSATION: Not tested   POSTURE: rounded shoulders and forward head  PALPATION: TTP along the surgical site/ proximal hip flexor.  Incision site covered by bandage.   LOWER EXTREMITY ROM:  Active ROM Right eval Left eval Left 06/23/23  Hip flexion 80 80 100  Hip extension 5 5   Hip abduction 22 supine 20 Supine   Hip adduction     Hip internal rotation     Hip external rotation  Knee flexion     Knee extension     Ankle dorsiflexion     Ankle plantarflexion     Ankle inversion     Ankle eversion      (Blank rows = not tested)  LOWER EXTREMITY MMT:  MMT Right eval Left eval Left 07/05/23  Hip flexion 3 3 4   Hip extension 3 3   Hip abduction Seated 3 seated3 4- seated  Hip adduction Seated 4- Seated 4-   Hip internal rotation     Hip external rotation     Knee flexion 4 4   Knee extension 4 4   Ankle dorsiflexion     Ankle plantarflexion     Ankle inversion     Ankle eversion      (Blank rows = not tested)  FUNCTIONAL TESTS:  5 times sit to stand: 20 seconds with significant use of hands pushing up from chair. 07/05/23: 14 sec with UE to rise 07/12/23: 14.8 without UE Timed up and go (TUG): 31 seconds   GAIT: Distance walked: 85 ft from waiting area to treatment room  Assistive device utilized: Walker - 2 wheeled Level of assistance: Modified independence Comments: decreased stride, flexed trunk   [facility]  [facility] Adult PT Treatment:                                                DATE: 07/12/23 Therapeutic Exercise: Nustep L5 UE/LE x 5 minutes  Heel and toe raises 4 inch step up , counter support x 10  Side stepping at counter with red band at ankles x 4  Knee ext machine 10# 10 x 2 bilat  Knee flex machine 25# 10 x 2  STS x 10 SLR 10 x 2 each  Side hip abduction 10 x 2 each Side clam x 10 each  Single leg bridge 10 x 2 each  Therapeutic Activity: Gait with SPC- demonstrates improved sequencing without cues and good safety in clinic  5 x STS 14.8 without UE     [facility] Adult PT Treatment:                                                DATE: 07/07/23 Therapeutic Exercise: Nustep L5 UE/LE x 5 minutes  Seated Knee ext 5# 10 x 2  Seated knee flex 20# 10 x 2  Heel raises x 20 Hip abduction 10 x 1 , red band 10 x 1 each  Lateral walking at counter red band at ankles x 4 Standing march 12 x 2  STS x 10 Bridge 10 x 1 Single leg bridge x 10 each  SLR x 10 each  side clam 10 x 2 each Side hip abduction 8 x 2 each SKTC x 30 se left LTR comfortable ROM    [facility] Adult PT Treatment:                                                DATE: 07/05/23 Therapeutic Exercise: Nustep L5 UE/LE x 5 min Bridge 2 x 10 Left SLR x 10 Left side clam  10 x 2  Left hip abduction x12 Single leg bridge x 10 each   Therapeutic Activity: 5 x STS 14 sec needs UE to rise.  Gait with SPC, SBA - mod cues for sequencing    [facility] Adult PT Treatment:                                                DATE: 06/23/23 Therapeutic Exercise: Nustep L5 UE/LE x 6 minutes  Heel raises x 20 Standing hip abduction 10 x 3  Standing march x 10 each  Standing right hip hikes 10 x 2  Seated LAQ 4# 15 x 3 each Seated march AROM  Ball squeeze 10 x 3  SKTC x 30 se left LTR comfortable ROM  Therapeutic Activity: Gait in parallel bars with 1 UE Side stepping in // bars x 4  Gait in // bars without UE- focus on equal  hip level s   [facility] Adult PT Treatment:                                                DATE: 06/21/23 Therapeutic Exercise: Nustep L3 UE/:E x 6 minutes  Heel raises x 20 Standing hip abdct 10 x 2 each Standing hip flexion x 10 each STS x 5 without UE Supine march Supine clam blue SLR x 12 each    [facility] Adult PT Treatment:                                                DATE: 06/17/23 Therapeutic Exercise: Nustep 5 min L5 UE/LE LAQ 2x10 3# STS c use of hands 5x Hooklying Single Knee to Chest 2 reps - 30 seconds hold SLR c QS 2x10. Quad lag not present Supine Bridge 2x10 reps Hooklying Clamshell c BluTB 2x15 reps Supine March 15 reps  Standing hip abd 2x10 each Heel raise/toe lifts 2x10 Therapeutic Activity: Gait training for ambulation with c SPC 90' x 2 and SBA for safety    PATIENT EDUCATION:  Education details: evaluation findings, POC, goals, HEP with proper form/ rationale.  Person educated: Patient Education method: Explanation, Verbal cues, and Handouts Education comprehension: verbalized understanding  HOME EXERCISE PROGRAM: Access Code: [email or web address] URL: [email or web address] Date: 06/15/2023 Prepared by: Joellyn Rued  Exercises - Supine Bridge  - 1 x daily - 7 x weekly - 2 sets - 10 reps - Hooklying Clamshell with Resistance  - 1 x daily - 7 x weekly - 2 sets - 15 reps - Supine March  - 1 x daily - 7 x weekly - 2 sets - 10 reps - 5 hold - Seated Gluteal Sets  - 1 x daily - 7 x weekly - 2 sets - 10 reps - Hooklying Single Knee to Chest Stretch  - 1 x daily - 7 x weekly - 2 sets - 2 reps - 30 seconds hold - Active Straight Leg Raise with Quad Set  - 1 x daily - 7 x weekly - 2 sets - 10 reps - 3 hold - Seated Long Arc Quad  - 1 x daily - 7  x weekly - 2 sets - 10 reps - 3 hold  ASSESSMENT:  CLINICAL IMPRESSION: Pt arrives reporting no pain. She has been practicing with her SPC at home and demonstrates improved [facility] safety and sequencing in clinic.  Today she demonstrates ability to rise from standard mat without UE and improved 5 x STS time. Continued with LE strengthening focus today, progressing resistance on gym machines for quad and hamstrings. She continues to demonstrate bilateral hip abduction weakness in side lying, fatigues quickly but improved since last session. Overall she continues to make progress toward all remaining LTGs. All STGs met. Pt tolerated PT today without adverse effects. Pt will continue to benefit from skilled PT to address impairments for improved L hip/LE function.   EVAL: Patient is a 77 y.o. F who was seen today for physical therapy evaluation and treatment for s/p L THA on 05/16/23.Patient exhibits limited hip flexion/ abduction AROM secondary to weakness, and limited hip extension secondary to anterior stiffness. MMT reveals gross L hip weakness, pt was unable to perform sit to stand without UE assist and completed 5 x STS using bil UE in 20 seconds and a TUG with RW at 31 seconds indicating higher risk of falling. Patient would benefit from physical therapy to promote hip mobility, increase strength and stability, maximize her function by addressing the deficits listed.   OBJECTIVE IMPAIRMENTS: decreased activity tolerance, decreased balance, decreased endurance, decreased ROM, decreased strength, impaired flexibility, postural dysfunction, and pain.   ACTIVITY LIMITATIONS: carrying, lifting, standing, squatting, stairs, and locomotion level  PARTICIPATION LIMITATIONS: shopping and community activity  PERSONAL FACTORS: Age and 1-2 comorbidities: CX, CHF  are also affecting patient's functional outcome.   REHAB POTENTIAL: Good  CLINICAL DECISION MAKING: Evolving/moderate complexity  EVALUATION COMPLEXITY: Moderate   GOALS: Goals reviewed with patient? Yes  SHORT TERM GOALS: Target date: 07/20/2023   Pt to be IND with initial HEP for therapeutic progression Baseline: no previous HEP 06/17/23: min  verbal cueing Goal status: MET  2.  Pt to increase L hip flexor/ abductor strength to 3+/5 to promote hip stability  Baseline: see flow sheet 07/05/23: 4-4-/5 Goal status: MET  3.  Increase L hip flexion to >/= 90 degrees  Baseline: see flowsheet 06/23/23: 100 Goal status: MET  4.  Pt to be able to perform 5 x STS with </= 20 seconds with minimal to no UE assistance Baseline: 20 seconds with significant UE use 07/05/23: needs UE to rise standard chair 07/12/23: 14.8 without UE Goal status: MET  LONG TERM GOALS: Target date: 08/31/2023   Improve L gross hip strength to >/= 4/5 to promote stability and safety with walking/ standing  Baseline: see flow sheet Goal status: INITIAL  2.  Pt to improve TUG to </= 18 seconds with LRAD to demonstrate improvement in mobility and function Baseline: initial score 31 seconds with 31 seconds Goal status: INITIAL  3.  Pt to be able to walk/ standing with LRAD for >/= 45 min for endurance required ADLS and community ambulation  Baseline:  Goal status: INITIAL  4.  Pt to be able to perform 5 x STS in </= 15 seconds with no UE use for improvement in function. Baseline:  Goal status: INITIAL  5.  Pt to be IND with all HEP and is able to maintain and progress her current LOF IND.  Baseline:  Goal status: INITIAL  PLAN:  PT FREQUENCY: 1-2x/week  PT DURATION: 12 weeks  PLANNED INTERVENTIONS: 97164- PT Re-evaluation, 97110-Therapeutic exercises, 97530-  Therapeutic activity, [id number]- Neuromuscular re-education, [id number]- Self Care, 16109- Manual therapy, [id number]- Gait training, [phone]- Aquatic Therapy, Stair training, Dry Needling, Joint mobilization, Cryotherapy, and Moist heat  PLAN FOR NEXT SESSION: Review/ update HEP PRN.    Jannette Spanner, PTA 07/12/23 3:15 PM Phone: [phone] Fax: [phone]

## 2023-07-14 ENCOUNTER — Ambulatory Visit: Payer: 59 | Admitting: Physical Therapy

## 2023-07-14 ENCOUNTER — Encounter: Payer: Self-pay | Admitting: Physical Therapy

## 2023-07-14 DIAGNOSIS — R2689 Other abnormalities of gait and mobility: Secondary | ICD-10-CM | POA: Diagnosis not present

## 2023-07-14 DIAGNOSIS — M6281 Muscle weakness (generalized): Secondary | ICD-10-CM | POA: Diagnosis not present

## 2023-07-14 DIAGNOSIS — M25552 Pain in left hip: Secondary | ICD-10-CM | POA: Diagnosis not present

## 2023-07-14 NOTE — Therapy (Signed)
OUTPATIENT PHYSICAL THERAPY LOWER EXTREMITY TREATMENT   Patient Name: Katie [name] MRN: [id number] DOB:05/11/1946, 77 y.o., female Today's Date: 07/14/2023  END OF SESSION:  PT End of Session - 07/14/23 1313     Visit Number 9    Number of Visits 24    Date for PT Re-Evaluation 08/31/23    Authorization Type [employer] MCR    Progress Note Due on Visit 10    PT Start Time 1310    PT Stop Time 1355    PT Time Calculation (min) 45 min               Past Medical History:  Diagnosis Date   Amyloid heart disease (HCC)    Arthritis    Cancer (HCC)    kidney cancer   Chronic combined systolic and diastolic CHF (congestive heart failure) (HCC)    CKD (chronic kidney disease), stage III (HCC)    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 s/p right nephrectomy ~2007.   History of loop recorder    Hypercholesterolemia    Hypertension    Hypothyroidism    Mitral regurgitation    Pulmonary hypertension (HCC)    Stroke (HCC)    no residual   Past Surgical History:  Procedure Laterality Date   BREAST EXCISIONAL BIOPSY Left    BREAST EXCISIONAL BIOPSY Right    CARDIAC CATHETERIZATION N/A 01/12/2016   Procedure: Right/Left Heart Cath and Coronary/Graft Angiography;  Surgeon: Lyn Records, MD;  Location: [facility] INVASIVE CV LAB;  Service: Cardiovascular;  Laterality: N/A;   CERVICAL POLYPECTOMY N/A 04/23/2013   Procedure: CERVICAL POLYPECTOMY;  Surgeon: Dorien Chihuahua. Richardson Dopp, MD;  Location: [facility] ORS;  Service: Gynecology;  Laterality: N/A;  Possible Polypectomy   COLONOSCOPY WITH PROPOFOL N/A 12/19/2020   Procedure: COLONOSCOPY WITH PROPOFOL;  Surgeon: Jeani Hawking, MD;  Location: [facility] ENDOSCOPY;  Service: Endoscopy;  Laterality: N/A;   HYSTEROSCOPY WITH D & C N/A 04/23/2013   Procedure: DILATATION AND CURETTAGE /HYSTEROSCOPY;  Surgeon: Dorien Chihuahua. Richardson Dopp, MD;  Location: [facility];  Service: Gynecology;  Laterality: N/A;   IR ANGIO INTRA EXTRACRAN SEL  COM CAROTID INNOMINATE BILAT MOD SED  02/11/2017   IR ANGIO VERTEBRAL SEL VERTEBRAL BILAT MOD SED  02/11/2017   KNEE ARTHROSCOPY Left    LOOP RECORDER INSERTION N/A 02/18/2017   Procedure: Loop Recorder Insertion;  Surgeon: Regan Lemming, MD;  Location: [facility] INVASIVE CV LAB;  Service: Cardiovascular;  Laterality: N/A;   MITRAL VALVE REPAIR  11/30/2017   NEPHRECTOMY Right    due to carcinoma   POLYPECTOMY  12/19/2020   Procedure: POLYPECTOMY;  Surgeon: Jeani Hawking, MD;  Location: [facility] ENDOSCOPY;  Service: Endoscopy;;   TEE WITHOUT CARDIOVERSION N/A 02/18/2017   Procedure: TRANSESOPHAGEAL ECHOCARDIOGRAM (TEE) WITH LOOP;  Surgeon: Vesta Mixer, MD;  Location[facility] ENDOSCOPY;  Service: Cardiovascular;  Laterality: N/A;   TOTAL HIP ARTHROPLASTY Left 05/16/2023   Procedure: TOTAL HIP ARTHROPLASTY ANTERIOR APPROACH;  Surgeon: Jodi Geralds, MD;  Location: [facility];  Service: Orthopedics;  Laterality: Left;   TRANSCATHETER MITRAL EDGE TO EDGE REPAIR N/A 11/30/2017   Procedure: MITRAL VALVE REPAIR;  Surgeon: Tonny Bollman, MD;  Location: [facility] INVASIVE CV LAB;  Service: Cardiovascular;  Laterality: N/A;   Patient Active Problem List   Diagnosis Date Noted   Hypotension 05/18/2023   Primary osteoarthritis of left hip 05/17/2023   H/O total hip arthroplasty,  left 05/16/2023   Diverticular disease of colon 03/03/2021   Family history of malignant neoplasm of digestive organs 03/03/2021   Shortness of breath 03/03/2021   Systolic heart failure (HCC) 03/03/2021   Rhabdomyolysis 06/06/2020   Acute kidney injury superimposed on chronic kidney disease (HCC) 06/05/2020   Hypothyroidism    Acidosis    History of stroke 02/06/2018   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/2017   Severe mitral regurgitation 11/30/2017   Amyloid heart disease (HCC)    Myopericarditis 09/06/2017   Non-rheumatic mitral regurgitation    Chronic diastolic congestive heart failure  (HCC) 05/08/2017   CHF exacerbation (HCC) 05/08/2017   Chronic migraine without aura without status migrainosus, not intractable    Hypertension 02/15/2017   Basilar artery occlusion    Cerebellar stroke (HCC) 02/10/2017   Headache 02/10/2017   Chest pain    Mitral regurgitation 01/08/2016   Moderate tricuspid regurgitation 01/08/2016   Pulmonary HTN (HCC) 01/08/2016   Acute CHF (congestive heart failure) (HCC) 01/07/2016   Elevated transaminase level 01/07/2016   Mild hypertension 01/07/2016   CKD (chronic kidney disease), stage III (HCC) 01/07/2016   Hyperlipidemia 01/07/2016   Acute CHF (HCC) 01/07/2016    PCP: Renford Dills, MD  REFERRING PROVIDER: Jodi Geralds, MD  REFERRING DIAG: S/P L THA Ant approach  THERAPY DIAG:  Pain in left hip  Muscle weakness (generalized)  Other abnormalities of gait and mobility  Rationale for Evaluation and Treatment: Rehabilitation  ONSET DATE: DOS 05/16/2023  SUBJECTIVE:   SUBJECTIVE STATEMENT: Hip is still doing fine. I like walking with the cane.     EVAL: Since the surgery everything is going good. She reported having home health PT for 3 visits. She reports overall everything is going pretty good having occasional pain that occurs on its that goes up to a 5 /10. Currently uses a RW at home and previously before the surgery would occasionally use a SPC.   PERTINENT HISTORY: Kidney Cx, CHF, See above PAIN:  Are you having pain? Yes: NPRS scale: 0/10, at worst 0/10 in the last 24 hours Pain location: in the the front Pain description: sore aching Aggravating factors: unsure Relieving factors: take medication,   PRECAUTIONS: None  RED FLAGS: None   WEIGHT BEARING RESTRICTIONS: No  FALLS:  Has patient fallen in last 6 months? No  LIVING ENVIRONMENT: Lives with: lives with their family and lives alone Lives in: House/apartment Stairs: Yes: External: 4 steps; on right going up Has following equipment at home: Single  point cane, Environmental consultant - 2 wheeled, Marine scientist  OCCUPATION: Retired  PLOF: Independent with basic ADLs  PATIENT GOALS: To get back on my feet and walking.   OBJECTIVE:  Note: Objective measures were completed at Evaluation unless otherwise noted.  DIAGNOSTIC FINDINGS: see chart  PATIENT SURVEYS:  FOTO 79% predicted 76% ( this didn't align with her objective assessment plan to re-capture FOTO assessment on 2nd visit) 06/15/23: 53%, predicted 66%  COGNITION: Overall cognitive status: Within functional limits for tasks assessed     SENSATION: Not tested   POSTURE: rounded shoulders and forward head  PALPATION: TTP along the surgical site/ proximal hip flexor.  Incision site covered by bandage.   LOWER EXTREMITY ROM:  Active ROM Right eval Left eval Left 06/23/23  Hip flexion 80 80 100  Hip extension 5 5   Hip abduction 22 supine 20 Supine   Hip adduction     Hip internal rotation  Hip external rotation     Knee flexion     Knee extension     Ankle dorsiflexion     Ankle plantarflexion     Ankle inversion     Ankle eversion      (Blank rows = not tested)  LOWER EXTREMITY MMT:  MMT Right eval Left eval Left 07/05/23  Hip flexion 3 3 4   Hip extension 3 3   Hip abduction Seated 3 seated3 4- seated  Hip adduction Seated 4- Seated 4-   Hip internal rotation     Hip external rotation     Knee flexion 4 4   Knee extension 4 4   Ankle dorsiflexion     Ankle plantarflexion     Ankle inversion     Ankle eversion      (Blank rows = not tested)  FUNCTIONAL TESTS:  5 times sit to stand: 20 seconds with significant use of hands pushing up from chair. 07/05/23: 14 sec with UE to rise 07/12/23: 14.8 without UE Timed up and go (TUG): 31 seconds : 07/14/23:  17 sec without AD  GAIT: Distance walked: 85 ft from waiting area to treatment room  Assistive device utilized: Walker - 2 wheeled Level of assistance: Modified independence Comments: decreased stride,  flexed trunk   [facility] Adult PT Treatment:                                                DATE: 07/14/23 Therapeutic Exercise: Nustep L5 UE/LE x 5 minutes  STS 10 x 2 - min cues for avoiding rock back / momentum Knee ext 10# 10 x 3  Knee flex 25# 10 x 3 Runners Step up 6 inches 10 x 2 - 1 UE touch SLR 10 x 3 Left, 10 x 2 right  (anterior right hip pain)   Therapeutic Activity: TUG: 17 sec without AD Gait with SPC 185 feet - able to keep consistent sequencing with min cues for equal step length   [facility] Adult PT Treatment:                                                DATE: 07/12/23 Therapeutic Exercise: Nustep L5 UE/LE x 5 minutes  Heel and toe raises 4 inch step up , counter support x 10  Side stepping at counter with red band at ankles x 4  Knee ext machine 10# 10 x 2 bilat  Knee flex machine 25# 10 x 2  STS x 10 SLR 10 x 2 each  Side hip abduction 10 x 2 each Side clam x 10 each  Single leg bridge 10 x 2 each  Therapeutic Activity: Gait with SPC- demonstrates improved sequencing without cues and good safety in clinic  5 x STS 14.8 without UE     [facility] Adult PT Treatment:                                                DATE: 07/07/23 Therapeutic Exercise: Nustep L5 UE/LE x 5 minutes  Seated Knee ext 5# 10 x 2  Seated knee flex 20# 10 x 2  Heel raises x 20 Hip abduction 10 x 1 , red band 10 x 1 each  Lateral walking at counter red band at ankles x 4 Standing march 12 x 2  STS x 10 Bridge 10 x 1 Single leg bridge x 10 each  SLR x 10 each  side clam 10 x 2 each Side hip abduction 8 x 2 each SKTC x 30 se left LTR comfortable ROM    [facility] Adult PT Treatment:                                                DATE: 07/05/23 Therapeutic Exercise: Nustep L5 UE/LE x 5 min Bridge 2 x 10 Left SLR x 10 Left side clam 10 x 2  Left hip abduction x12 Single leg bridge x 10 each   Therapeutic Activity: 5 x STS 14 sec needs UE to rise.  Gait with SPC, SBA -  mod cues for sequencing    [facility] Adult PT Treatment:                                                DATE: 06/23/23 Therapeutic Exercise: Nustep L5 UE/LE x 6 minutes  Heel raises x 20 Standing hip abduction 10 x 3  Standing march x 10 each  Standing right hip hikes 10 x 2  Seated LAQ 4# 15 x 3 each Seated march AROM  Ball squeeze 10 x 3  SKTC x 30 se left LTR comfortable ROM  Therapeutic Activity: Gait in parallel bars with 1 UE Side stepping in // bars x 4  Gait in // bars without UE- focus on equal hip level s   [facility] Adult PT Treatment:                                                DATE: 06/21/23 Therapeutic Exercise: Nustep L3 UE/:E x 6 minutes  Heel raises x 20 Standing hip abdct 10 x 2 each Standing hip flexion x 10 each STS x 5 without UE Supine march Supine clam blue SLR x 12 each    [facility] Adult PT Treatment:                                                DATE: 06/17/23 Therapeutic Exercise: Nustep 5 min L5 UE/LE LAQ 2x10 3# STS c use of hands 5x Hooklying Single Knee to Chest 2 reps - 30 seconds hold SLR c QS 2x10. Quad lag not present Supine Bridge 2x10 reps Hooklying Clamshell c BluTB 2x15 reps Supine March 15 reps  Standing hip abd 2x10 each Heel raise/toe lifts 2x10 Therapeutic Activity: Gait training for ambulation with c SPC 90' x 2 and SBA for safety    PATIENT EDUCATION:  Education details: evaluation findings, POC, goals, HEP with proper form/ rationale.  Person educated: Patient Education method: Explanation, Verbal cues, and Handouts Education comprehension: verbalized understanding  HOME EXERCISE  PROGRAM: Access Code: [email or web address] URL: [email or web address] Date: 06/15/2023 Prepared by: Joellyn Rued  Exercises - Supine Bridge  - 1 x daily - 7 x weekly - 2 sets - 10 reps - Hooklying Clamshell with Resistance  - 1 x daily - 7 x weekly - 2 sets - 15 reps - Supine March  - 1 x daily - 7 x weekly - 2 sets - 10 reps - 5 hold -  Seated Gluteal Sets  - 1 x daily - 7 x weekly - 2 sets - 10 reps - Hooklying Single Knee to Chest Stretch  - 1 x daily - 7 x weekly - 2 sets - 2 reps - 30 seconds hold - Active Straight Leg Raise with Quad Set  - 1 x daily - 7 x weekly - 2 sets - 10 reps - 3 hold - Seated Long Arc Quad  - 1 x daily - 7 x weekly - 2 sets - 10 reps - 3 hold  ASSESSMENT:  CLINICAL IMPRESSION: Pt arrives reporting no pain. She has been practicing with her SPC at home and demonstrates good [facility] safety and sequencing in clinic.  Continued with LE strengthening focus today, progressing sets on gym machines for quad and hamstrings. TUG improved to 17 sec , LTG#2 met.  Overall she continues to make progress toward all remaining LTGs. Pt tolerated PT today without adverse effects. Pt will continue to benefit from skilled PT to address impairments for improved L hip/LE function.   EVAL: Patient is a 77 y.o. F who was seen today for physical therapy evaluation and treatment for s/p L THA on 05/16/23.Patient exhibits limited hip flexion/ abduction AROM secondary to weakness, and limited hip extension secondary to anterior stiffness. MMT reveals gross L hip weakness, pt was unable to perform sit to stand without UE assist and completed 5 x STS using bil UE in 20 seconds and a TUG with RW at 31 seconds indicating higher risk of falling. Patient would benefit from physical therapy to promote hip mobility, increase strength and stability, maximize her function by addressing the deficits listed.   OBJECTIVE IMPAIRMENTS: decreased activity tolerance, decreased balance, decreased endurance, decreased ROM, decreased strength, impaired flexibility, postural dysfunction, and pain.   ACTIVITY LIMITATIONS: carrying, lifting, standing, squatting, stairs, and locomotion level  PARTICIPATION LIMITATIONS: shopping and community activity  PERSONAL FACTORS: Age and 1-2 comorbidities: CX, CHF  are also affecting patient's functional outcome.    REHAB POTENTIAL: Good  CLINICAL DECISION MAKING: Evolving/moderate complexity  EVALUATION COMPLEXITY: Moderate   GOALS: Goals reviewed with patient? Yes  SHORT TERM GOALS: Target date: 07/20/2023   Pt to be IND with initial HEP for therapeutic progression Baseline: no previous HEP 06/17/23: min verbal cueing Goal status: MET  2.  Pt to increase L hip flexor/ abductor strength to 3+/5 to promote hip stability  Baseline: see flow sheet 07/05/23: 4-4-/5 Goal status: MET  3.  Increase L hip flexion to >/= 90 degrees  Baseline: see flowsheet 06/23/23: 100 Goal status: MET  4.  Pt to be able to perform 5 x STS with </= 20 seconds with minimal to no UE assistance Baseline: 20 seconds with significant UE use 07/05/23: needs UE to rise standard chair 07/12/23: 14.8 without UE Goal status: MET  LONG TERM GOALS: Target date: 08/31/2023  Improve L gross hip strength to >/= 4/5 to promote stability and safety with walking/ standing  Baseline: see flow sheet Goal status: ONGOING  2.  Pt to improve TUG to </=  18 seconds with LRAD to demonstrate improvement in mobility and function Baseline: initial score 31 seconds with 31 seconds 07/14/23: 17.0 sec without AD Goal status: MET  3.  Pt to be able to walk/ standing with LRAD for >/= 45 min for endurance required ADLS and community ambulation  Baseline:  Goal status: ONGOING  4.  Pt to be able to perform 5 x STS in </= 15 seconds with no UE use for improvement in function. Baseline:  07/12/23: 14.8 sec Goal status: MET  5.  Pt to be IND with all HEP and is able to maintain and progress her current LOF IND.  Baseline:  Goal status: ONGOING  PLAN:  PT FREQUENCY: 1-2x/week  PT DURATION: 12 weeks  PLANNED INTERVENTIONS: 97164- PT Re-evaluation, 97110-Therapeutic exercises, 97530- Therapeutic activity, [id number]- Neuromuscular re-education, 97535- Self Care, [id number]- Manual therapy, [phone]- Gait training, [phone]- Aquatic Therapy,  Stair training, Dry Needling, Joint mobilization, Cryotherapy, and Moist heat  PLAN FOR NEXT SESSION: Review/ update HEP PRN , complete LTGs and DC   Jannette Spanner, PTA 07/14/23 1:56 PM Phone: [phone] Fax: [phone]

## 2023-07-19 ENCOUNTER — Encounter: Payer: Self-pay | Admitting: Physical Therapy

## 2023-07-19 ENCOUNTER — Ambulatory Visit: Payer: 59 | Admitting: Physical Therapy

## 2023-07-19 DIAGNOSIS — R2689 Other abnormalities of gait and mobility: Secondary | ICD-10-CM

## 2023-07-19 DIAGNOSIS — M25552 Pain in left hip: Secondary | ICD-10-CM | POA: Diagnosis not present

## 2023-07-19 DIAGNOSIS — M6281 Muscle weakness (generalized): Secondary | ICD-10-CM

## 2023-07-19 NOTE — Therapy (Signed)
OUTPATIENT PHYSICAL THERAPY LOWER EXTREMITY TREATMENT  Progress Note Reporting Period 06/08/2023 to 06/19/2023  See note below for Objective Data and Assessment of Progress/Goals.       Patient Name: AAMORI NICKLIN MRN: [id number] DOB:Feb 03, 1946, 77 y.o., female Today's Date: 07/19/2023  END OF SESSION:  PT End of Session - 07/19/23 1311     Visit Number 10    Number of Visits 24    Date for PT Re-Evaluation 08/31/23    Authorization Type [employer] MCR    Progress Note Due on Visit 10    PT Start Time 1315    PT Stop Time 1400    PT Time Calculation (min) 45 min               Past Medical History:  Diagnosis Date   Amyloid heart disease (HCC)    Arthritis    Cancer (HCC)    kidney cancer   Chronic combined systolic and diastolic CHF (congestive heart failure) (HCC)    CKD (chronic kidney disease), stage III (HCC)    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 s/p right nephrectomy ~2007.   History of loop recorder    Hypercholesterolemia    Hypertension    Hypothyroidism    Mitral regurgitation    Pulmonary hypertension (HCC)    Stroke (HCC)    no residual   Past Surgical History:  Procedure Laterality Date   BREAST EXCISIONAL BIOPSY Left    BREAST EXCISIONAL BIOPSY Right    CARDIAC CATHETERIZATION N/A 01/12/2016   Procedure: Right/Left Heart Cath and Coronary/Graft Angiography;  Surgeon: Lyn Records, MD;  Location: [facility] INVASIVE CV LAB;  Service: Cardiovascular;  Laterality: N/A;   CERVICAL POLYPECTOMY N/A 04/23/2013   Procedure: CERVICAL POLYPECTOMY;  Surgeon: Dorien Chihuahua. Richardson Dopp, MD;  Location: [facility] ORS;  Service: Gynecology;  Laterality: N/A;  Possible Polypectomy   COLONOSCOPY WITH PROPOFOL N/A 12/19/2020   Procedure: COLONOSCOPY WITH PROPOFOL;  Surgeon: Jeani Hawking, MD;  Location: [facility] ENDOSCOPY;  Service: Endoscopy;  Laterality: N/A;   HYSTEROSCOPY WITH D & C N/A 04/23/2013   Procedure: DILATATION AND  CURETTAGE /HYSTEROSCOPY;  Surgeon: Dorien Chihuahua. Richardson Dopp, MD;  Location: [facility];  Service: Gynecology;  Laterality: N/A;   IR ANGIO INTRA EXTRACRAN SEL COM CAROTID INNOMINATE BILAT MOD SED  02/11/2017   IR ANGIO VERTEBRAL SEL VERTEBRAL BILAT MOD SED  02/11/2017   KNEE ARTHROSCOPY Left    LOOP RECORDER INSERTION N/A 02/18/2017   Procedure: Loop Recorder Insertion;  Surgeon: Regan Lemming, MD;  Location: [facility] INVASIVE CV LAB;  Service: Cardiovascular;  Laterality: N/A;   MITRAL VALVE REPAIR  11/30/2017   NEPHRECTOMY Right    due to carcinoma   POLYPECTOMY  12/19/2020   Procedure: POLYPECTOMY;  Surgeon: Jeani Hawking, MD;  Location: [facility] ENDOSCOPY;  Service: Endoscopy;;   TEE WITHOUT CARDIOVERSION N/A 02/18/2017   Procedure: TRANSESOPHAGEAL ECHOCARDIOGRAM (TEE) WITH LOOP;  Surgeon: Vesta Mixer, MD;  Location: [facility] ENDOSCOPY;  Service: Cardiovascular;  Laterality: N/A;   TOTAL HIP ARTHROPLASTY Left 05/16/2023   Procedure: TOTAL HIP ARTHROPLASTY ANTERIOR APPROACH;  Surgeon: Jodi Geralds, MD;  Location: [facility];  Service: Orthopedics;  Laterality: Left;   TRANSCATHETER MITRAL EDGE TO EDGE REPAIR N/A 11/30/2017   Procedure: MITRAL VALVE REPAIR;  Surgeon: Tonny Bollman, MD;  Location: [facility] INVASIVE CV LAB;  Service: Cardiovascular;  Laterality: N/A;   Patient Active Problem List  Diagnosis Date Noted   Hypotension 05/18/2023   Primary osteoarthritis of left hip 05/17/2023   H/O total hip arthroplasty, left 05/16/2023   Diverticular disease of colon 03/03/2021   Family history of malignant neoplasm of digestive organs 03/03/2021   Shortness of breath 03/03/2021   Systolic heart failure (HCC) 03/03/2021   Rhabdomyolysis 06/06/2020   Acute kidney injury superimposed on chronic kidney disease (HCC) 06/05/2020   Hypothyroidism    Acidosis    History of stroke 02/06/2018   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/2017   Severe mitral regurgitation 11/30/2017    Amyloid heart disease (HCC)    Myopericarditis 09/06/2017   Non-rheumatic mitral regurgitation    Chronic diastolic congestive heart failure (HCC) 05/08/2017   CHF exacerbation (HCC) 05/08/2017   Chronic migraine without aura without status migrainosus, not intractable    Hypertension 02/15/2017   Basilar artery occlusion    Cerebellar stroke (HCC) 02/10/2017   Headache 02/10/2017   Chest pain    Mitral regurgitation 01/08/2016   Moderate tricuspid regurgitation 01/08/2016   Pulmonary HTN (HCC) 01/08/2016   Acute CHF (congestive heart failure) (HCC) 01/07/2016   Elevated transaminase level 01/07/2016   Mild hypertension 01/07/2016   CKD (chronic kidney disease), stage III (HCC) 01/07/2016   Hyperlipidemia 01/07/2016   Acute CHF (HCC) 01/07/2016    PCP: Renford Dills, MD  REFERRING PROVIDER: Jodi Geralds, MD  REFERRING DIAG: S/P L THA Ant approach  THERAPY DIAG:  Pain in left hip  Muscle weakness (generalized)  Other abnormalities of gait and mobility  Rationale for Evaluation and Treatment: Rehabilitation  ONSET DATE: DOS 05/16/2023  SUBJECTIVE:   SUBJECTIVE STATEMENT: I am using the cane at home or I go without anything.     EVAL: Since the surgery everything is going good. She reported having home health PT for 3 visits. She reports overall everything is going pretty good having occasional pain that occurs on its that goes up to a 5 /10. Currently uses a RW at home and previously before the surgery would occasionally use a SPC.   PERTINENT HISTORY: Kidney Cx, CHF, See above PAIN:  Are you having pain? Yes: NPRS scale: 0/10, at worst 0/10 in the last 24 hours Pain location: in the the front Pain description: sore aching Aggravating factors: unsure Relieving factors: take medication,   PRECAUTIONS: None  RED FLAGS: None   WEIGHT BEARING RESTRICTIONS: No  FALLS:  Has patient fallen in last 6 months? No  LIVING ENVIRONMENT: Lives with: lives with  their family and lives alone Lives in: House/apartment Stairs: Yes: External: 4 steps; on right going up Has following equipment at home: Single point cane, Environmental consultant - 2 wheeled, Marine scientist  OCCUPATION: Retired  PLOF: Independent with basic ADLs  PATIENT GOALS: To get back on my feet and walking.   OBJECTIVE:  Note: Objective measures were completed at Evaluation unless otherwise noted.  DIAGNOSTIC FINDINGS: see chart  PATIENT SURVEYS:  FOTO 79% predicted 76% ( this didn't align with her objective assessment plan to re-capture FOTO assessment on 2nd visit) 06/15/23: 53%, predicted 66% 07/19/23: FOTO 70% GOAL MET   COGNITION: Overall cognitive status: Within functional limits for tasks assessed     SENSATION: Not tested   POSTURE: rounded shoulders and forward head  PALPATION: TTP along the surgical site/ proximal hip flexor.  Incision site covered by bandage.   LOWER EXTREMITY ROM:  Active ROM Right eval Left eval Left 06/23/23  Hip flexion 80  80 100  Hip extension 5 5   Hip abduction 22 supine 20 Supine   Hip adduction     Hip internal rotation     Hip external rotation     Knee flexion     Knee extension     Ankle dorsiflexion     Ankle plantarflexion     Ankle inversion     Ankle eversion      (Blank rows = not tested)  LOWER EXTREMITY MMT:  MMT Right eval Left eval Left 07/05/23 Left  07/19/23  Hip flexion 3 3 4    Hip extension 3 3    Hip abduction Seated 3 seated3 4 seated 4- side lying   Hip adduction Seated 4- Seated 4-    Hip internal rotation      Hip external rotation      Knee flexion 4 4    Knee extension 4 4    Ankle dorsiflexion      Ankle plantarflexion      Ankle inversion      Ankle eversion       (Blank rows = not tested)  FUNCTIONAL TESTS:  5 times sit to stand: 20 seconds with significant use of hands pushing up from chair. 07/05/23: 14 sec with UE to rise 07/12/23: 14.8 without UE Timed up and go (TUG): 31 seconds :  07/14/23:  17 sec without AD 07/19/23: SLS 7 sec each  GAIT: Distance walked: 85 ft from waiting area to treatment room  Assistive device utilized: Walker - 2 wheeled Level of assistance: Modified independence Comments: decreased stride, flexed trunk   [facility] Adult PT Treatment:                                                DATE: 07/19/23 Therapeutic Exercise: Nustep L5 UE /LE x 5 minutes Knee ext 10# x 15, 15# x 10 Knee flex 35# 10 x 2  Heel raises x 20 Side hip abduction 3 x 10 Bridge 12 x 3 SLR 10 x 2 Left  SLS 7 sec best each leg  Therapeutic Activity: Reciprocal stair negotiation with single UE support - good safety FOTO 70%    [facility] Adult PT Treatment:                                                DATE: 07/14/23 Therapeutic Exercise: Nustep L5 UE/LE x 5 minutes  STS 10 x 2 - min cues for avoiding rock back / momentum Knee ext 10# 10 x 3  Knee flex 25# 10 x 3 Runners Step up 6 inches 10 x 2 - 1 UE touch SLR 10 x 3 Left, 10 x 2 right  (anterior right hip pain)   Therapeutic Activity: TUG: 17 sec without AD Gait with SPC 185 feet - able to keep consistent sequencing with min cues for equal step length   [facility] Adult PT Treatment:                                                DATE: 07/12/23 Therapeutic Exercise: Nustep L5  UE/LE x 5 minutes  Heel and toe raises 4 inch step up , counter support x 10  Side stepping at counter with red band at ankles x 4  Knee ext machine 10# 10 x 2 bilat  Knee flex machine 25# 10 x 2  STS x 10 SLR 10 x 2 each  Side hip abduction 10 x 2 each Side clam x 10 each  Single leg bridge 10 x 2 each  Therapeutic Activity: Gait with SPC- demonstrates improved sequencing without cues and good safety in clinic  5 x STS 14.8 without UE     [facility] Adult PT Treatment:                                                DATE: 07/07/23 Therapeutic Exercise: Nustep L5 UE/LE x 5 minutes  Seated Knee ext 5# 10 x 2  Seated knee flex  20# 10 x 2  Heel raises x 20 Hip abduction 10 x 1 , red band 10 x 1 each  Lateral walking at counter red band at ankles x 4 Standing march 12 x 2  STS x 10 Bridge 10 x 1 Single leg bridge x 10 each  SLR x 10 each  side clam 10 x 2 each Side hip abduction 8 x 2 each SKTC x 30 se left LTR comfortable ROM    [facility] Adult PT Treatment:                                                DATE: 07/05/23 Therapeutic Exercise: Nustep L5 UE/LE x 5 min Bridge 2 x 10 Left SLR x 10 Left side clam 10 x 2  Left hip abduction x12 Single leg bridge x 10 each   Therapeutic Activity: 5 x STS 14 sec needs UE to rise.  Gait with SPC, SBA - mod cues for sequencing    [facility] Adult PT Treatment:                                                DATE: 06/23/23 Therapeutic Exercise: Nustep L5 UE/LE x 6 minutes  Heel raises x 20 Standing hip abduction 10 x 3  Standing march x 10 each  Standing right hip hikes 10 x 2  Seated LAQ 4# 15 x 3 each Seated march AROM  Ball squeeze 10 x 3  SKTC x 30 se left LTR comfortable ROM  Therapeutic Activity: Gait in parallel bars with 1 UE Side stepping in // bars x 4  Gait in // bars without UE- focus on equal hip level s     PATIENT EDUCATION:  Education details: evaluation findings, POC, goals, HEP with proper form/ rationale.  Person educated: Patient Education method: Explanation, Verbal cues, and Handouts Education comprehension: verbalized understanding  HOME EXERCISE PROGRAM: Access Code: [email or web address] URL: [email or web address] Date: 06/15/2023 Prepared by: Joellyn Rued  Exercises - Supine Bridge  - 1 x daily - 7 x weekly - 2 sets - 10 reps - Hooklying Clamshell with Resistance  - 1 x daily - 7 x weekly - 2 sets - 15  reps - Supine March  - 1 x daily - 7 x weekly - 2 sets - 10 reps - 5 hold - Seated Gluteal Sets  - 1 x daily - 7 x weekly - 2 sets - 10 reps - Hooklying Single Knee to Chest Stretch  - 1 x daily - 7 x weekly - 2 sets - 2  reps - 30 seconds hold - Active Straight Leg Raise with Quad Set  - 1 x daily - 7 x weekly - 2 sets - 10 reps - 3 hold - Seated Long Arc Quad  - 1 x daily - 7 x weekly - 2 sets - 10 reps - 3 hold - Sidelying Hip Abduction  - 1 x daily - 7 x weekly - 3 sets - 10 reps - Standing Single Leg Stance with Counter Support  - 1 x daily - 7 x weekly - 1 sets - 3 reps - 10 hold  ASSESSMENT:  CLINICAL IMPRESSION: Pt arrives reporting no pain. She has been practicing with her SPC at home and demonstrates good [facility] safety and sequencing in clinic.  Able to negotiate stairs safely and reciprocally with 1 UE support. FOTO Score improved beyond target. She has been out to a store 1 x for about 20 minutes with her grand daughter and did well. She continues to use her RW outside of her home. Pt is pleased with her progress and is requesting discharge to her HEP at next visit. She will benefit from a finalized HEP to continue on her own.  Pt tolerated PT today without adverse effects.  EVAL: Patient is a 77 y.o. F who was seen today for physical therapy evaluation and treatment for s/p L THA on 05/16/23.Patient exhibits limited hip flexion/ abduction AROM secondary to weakness, and limited hip extension secondary to anterior stiffness. MMT reveals gross L hip weakness, pt was unable to perform sit to stand without UE assist and completed 5 x STS using bil UE in 20 seconds and a TUG with RW at 31 seconds indicating higher risk of falling. Patient would benefit from physical therapy to promote hip mobility, increase strength and stability, maximize her function by addressing the deficits listed.   OBJECTIVE IMPAIRMENTS: decreased activity tolerance, decreased balance, decreased endurance, decreased ROM, decreased strength, impaired flexibility, postural dysfunction, and pain.   ACTIVITY LIMITATIONS: carrying, lifting, standing, squatting, stairs, and locomotion level  PARTICIPATION LIMITATIONS: shopping and community  activity  PERSONAL FACTORS: Age and 1-2 comorbidities: CX, CHF  are also affecting patient's functional outcome.   REHAB POTENTIAL: Good  CLINICAL DECISION MAKING: Evolving/moderate complexity  EVALUATION COMPLEXITY: Moderate   GOALS: Goals reviewed with patient? Yes  SHORT TERM GOALS: Target date: 07/20/2023   Pt to be IND with initial HEP for therapeutic progression Baseline: no previous HEP 06/17/23: min verbal cueing Goal status: MET  2.  Pt to increase L hip flexor/ abductor strength to 3+/5 to promote hip stability  Baseline: see flow sheet 07/05/23: 4-4-/5 Goal status: MET  3.  Increase L hip flexion to >/= 90 degrees  Baseline: see flowsheet 06/23/23: 100 Goal status: MET  4.  Pt to be able to perform 5 x STS with </= 20 seconds with minimal to no UE assistance Baseline: 20 seconds with significant UE use 07/05/23: needs UE to rise standard chair 07/12/23: 14.8 without UE Goal status: MET  LONG TERM GOALS: Target date: 08/31/2023  Improve L gross hip strength to >/= 4/5 to promote stability and safety with walking/  standing  Baseline: see flow sheet 07/19/23: 4-/5 Goal status: ONGOING  2.  Pt to improve TUG to </= 18 seconds with LRAD to demonstrate improvement in mobility and function Baseline: initial score 31 seconds with 31 seconds 07/14/23: 17.0 sec without AD Goal status: MET  3.  Pt to be able to walk/ standing with LRAD for >/= 45 min for endurance required ADLS and community ambulation  Baseline:  07/19/23: pt does not go out much. Was able to go to the store for 20 minutes, used RW, thinks she would do fine Goal status: ONGOING  4.  Pt to be able to perform 5 x STS in </= 15 seconds with no UE use for improvement in function. Baseline:  07/12/23: 14.8 sec Goal status: MET  5.  Pt to be IND with all HEP and is able to maintain and progress her current LOF IND.  Baseline:  Goal status: ONGOING  PLAN:  PT FREQUENCY: 1-2x/week  PT  DURATION: 12 weeks  PLANNED INTERVENTIONS: 97164- PT Re-evaluation, 97110-Therapeutic exercises, 97530- Therapeutic activity, 97112- Neuromuscular re-education, 97535- Self Care, 45409- Manual therapy, [phone]- Gait training, [phone]- Aquatic Therapy, Stair training, Dry Needling, Joint mobilization, Cryotherapy, and Moist heat  PLAN FOR NEXT SESSION: Review/ update HEP PRN , complete LTGs and DC, finalize HEP    Jannette Spanner, PTA 07/19/23 3:36 PM Phone: [phone] Fax: [phone]     Lulu Riding PT, DPT, LAT, ATC  07/20/23  8:00 AM

## 2023-07-21 ENCOUNTER — Encounter: Payer: Self-pay | Admitting: Physical Therapy

## 2023-07-21 ENCOUNTER — Ambulatory Visit: Payer: 59 | Admitting: Physical Therapy

## 2023-07-21 DIAGNOSIS — M6281 Muscle weakness (generalized): Secondary | ICD-10-CM | POA: Diagnosis not present

## 2023-07-21 DIAGNOSIS — R2689 Other abnormalities of gait and mobility: Secondary | ICD-10-CM | POA: Diagnosis not present

## 2023-07-21 DIAGNOSIS — M25552 Pain in left hip: Secondary | ICD-10-CM

## 2023-07-21 NOTE — Therapy (Signed)
OUTPATIENT PHYSICAL THERAPY LOWER EXTREMITY TREATMENT PHYSICAL THERAPY DISCHARGE SUMMARY  Visits from Start of Care: 11  Current functional level related to goals / functional outcomes: WFL, all goals met    Remaining deficits: See assessment   Education / Equipment: See assessment   Patient agrees to discharge. Patient goals were met. Patient is being discharged due to meeting the stated rehab goals.  Lulu Riding PT, DPT, LAT, ATC  07/21/23  3:51 PM   Patient Name: Katie [name] MRN: [id number] DOB:[birth year], 77 y.o., female Today's Date: 07/21/2023  END OF SESSION:  PT End of Session - 07/21/23 1334     Visit Number 11    Number of Visits 24    Date for PT Re-Evaluation 08/31/23    PT Start Time 1330    PT Stop Time 1415    PT Time Calculation (min) 45 min    Activity Tolerance Patient tolerated treatment well                Past Medical History:  Diagnosis Date   Amyloid heart disease (HCC)    Arthritis    Cancer (HCC)    kidney cancer   Chronic combined systolic and diastolic CHF (congestive heart failure) (HCC)    CKD (chronic kidney disease), stage III (HCC)    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 s/p right nephrectomy ~2007.   History of loop recorder    Hypercholesterolemia    Hypertension    Hypothyroidism    Mitral regurgitation    Pulmonary hypertension (HCC)    Stroke (HCC)    no residual   Past Surgical History:  Procedure Laterality Date   BREAST EXCISIONAL BIOPSY Left    BREAST EXCISIONAL BIOPSY Right    CARDIAC CATHETERIZATION N/A 01/12/2016   Procedure: Right/Left Heart Cath and Coronary/Graft Angiography;  Surgeon: Lyn Records, MD;  Location: [facility] INVASIVE CV LAB;  Service: Cardiovascular;  Laterality: N/A;   CERVICAL POLYPECTOMY N/A 04/23/2013   Procedure: CERVICAL POLYPECTOMY;  Surgeon: Dorien Chihuahua. Richardson Dopp, MD;  Location: [facility] ORS;  Service: Gynecology;  Laterality:  N/A;  Possible Polypectomy   COLONOSCOPY WITH PROPOFOL N/A 12/19/2020   Procedure: COLONOSCOPY WITH PROPOFOL;  Surgeon: Jeani Hawking, MD;  Location: [facility] ENDOSCOPY;  Service: Endoscopy;  Laterality: N/A;   HYSTEROSCOPY WITH D & C N/A 04/23/2013   Procedure: DILATATION AND CURETTAGE /HYSTEROSCOPY;  Surgeon: Dorien Chihuahua. Richardson Dopp, MD;  Location: [facility];  Service: Gynecology;  Laterality: N/A;   IR ANGIO INTRA EXTRACRAN SEL COM CAROTID INNOMINATE BILAT MOD SED  02/11/2017   IR ANGIO VERTEBRAL SEL VERTEBRAL BILAT MOD SED  02/11/2017   KNEE ARTHROSCOPY Left    LOOP RECORDER INSERTION N/A 02/18/2017   Procedure: Loop Recorder Insertion;  Surgeon: Regan Lemming, MD;  Location: [facility] INVASIVE CV LAB;  Service: Cardiovascular;  Laterality: N/A;   MITRAL VALVE REPAIR  11/30/2017   NEPHRECTOMY Right    due to carcinoma   POLYPECTOMY  12/19/2020   Procedure: POLYPECTOMY;  Surgeon: Jeani Hawking, MD;  Location: [facility] ENDOSCOPY;  Service: Endoscopy;;   TEE WITHOUT CARDIOVERSION N/A 02/18/2017   Procedure: TRANSESOPHAGEAL ECHOCARDIOGRAM (TEE) WITH LOOP;  Surgeon: Vesta Mixer, MD;  Location: [facility] ENDOSCOPY;  Service: Cardiovascular;  Laterality: N/A;   TOTAL HIP ARTHROPLASTY Left 05/16/2023   Procedure: TOTAL HIP ARTHROPLASTY ANTERIOR APPROACH;  Surgeon: Jodi Geralds, MD;  Location: [facility] ORS;  Service:  Orthopedics;  Laterality: Left;   TRANSCATHETER MITRAL EDGE TO EDGE REPAIR N/A 11/30/2017   Procedure: MITRAL VALVE REPAIR;  Surgeon: Tonny Bollman, MD;  Location: [facility] INVASIVE CV LAB;  Service: Cardiovascular;  Laterality: N/A;   Patient Active Problem List   Diagnosis Date Noted   Hypotension 05/18/2023   Primary osteoarthritis of left hip 05/17/2023   H/O total hip arthroplasty, left 05/16/2023   Diverticular disease of colon 03/03/2021   Family history of malignant neoplasm of digestive organs 03/03/2021   Shortness of breath 03/03/2021   Systolic heart failure (HCC) 03/03/2021   Rhabdomyolysis 06/06/2020   Acute  kidney injury superimposed on chronic kidney disease (HCC) 06/05/2020   Hypothyroidism    Acidosis    History of stroke 02/06/2018   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/2017   Severe mitral regurgitation 11/30/2017   Amyloid heart disease (HCC)    Myopericarditis 09/06/2017   Non-rheumatic mitral regurgitation    Chronic diastolic congestive heart failure (HCC) 05/08/2017   CHF exacerbation (HCC) 05/08/2017   Chronic migraine without aura without status migrainosus, not intractable    Hypertension 02/15/2017   Basilar artery occlusion    Cerebellar stroke (HCC) 02/10/2017   Headache 02/10/2017   Chest pain    Mitral regurgitation 01/08/2016   Moderate tricuspid regurgitation 01/08/2016   Pulmonary HTN (HCC) 01/08/2016   Acute CHF (congestive heart failure) (HCC) 01/07/2016   Elevated transaminase level 01/07/2016   Mild hypertension 01/07/2016   CKD (chronic kidney disease), stage III (HCC) 01/07/2016   Hyperlipidemia 01/07/2016   Acute CHF (HCC) 01/07/2016    PCP: Renford Dills, MD  REFERRING PROVIDER: Jodi Geralds, MD  REFERRING DIAG: S/P L THA Ant approach  THERAPY DIAG:  Pain in left hip  Muscle weakness (generalized)  Other abnormalities of gait and mobility  Rationale for Evaluation and Treatment: Rehabilitation  ONSET DATE: DOS 05/16/2023  SUBJECTIVE:   SUBJECTIVE STATEMENT: Pt stated feeling good with no pain and is independent with adls, pt still uses walker around the house however is otherwise independent. Pt stated desire to be discharged as therapeutic have been et.   EVAL: Since the surgery everything is going good. She reported having home health PT for 3 visits. She reports overall everything is going pretty good having occasional pain that occurs on its that goes up to a 5 /10. Currently uses a RW at home and previously before the surgery would occasionally use a SPC.   PERTINENT HISTORY: Kidney  Cx, CHF, See above PAIN:  Are you having pain? Yes: NPRS scale: 0/10, at worst 0/10 in the last 24 hours Pain location: in the the front Pain description: sore aching Aggravating factors: unsure Relieving factors: take medication,   PRECAUTIONS: None  RED FLAGS: None   WEIGHT BEARING RESTRICTIONS: No  FALLS:  Has patient fallen in last 6 months? No  LIVING ENVIRONMENT: Lives with: lives with their family and lives alone Lives in: House/apartment Stairs: Yes: External: 4 steps; on right going up Has following equipment at home: Single point cane, Environmental consultant - 2 wheeled, Marine scientist  OCCUPATION: Retired  PLOF: Independent with basic ADLs  PATIENT GOALS: To get back on my feet and walking.   OBJECTIVE:  Note: Objective measures were completed at Evaluation unless otherwise noted.  DIAGNOSTIC FINDINGS: see chart  PATIENT SURVEYS:  FOTO 79% predicted 76% ( this didn't align with her objective assessment plan to re-capture FOTO assessment on 2nd visit) 06/15/23: 53%, predicted 66% 07/19/23:  FOTO 70% GOAL MET   COGNITION: Overall cognitive status: Within functional limits for tasks assessed     SENSATION: Not tested   POSTURE: rounded shoulders and forward head  PALPATION: TTP along the surgical site/ proximal hip flexor.  Incision site covered by bandage.   LOWER EXTREMITY ROM:  Active ROM Right eval Left eval Left 06/23/23  Hip flexion 80 80 100  Hip extension 5 5   Hip abduction 22 supine 20 Supine   Hip adduction     Hip internal rotation     Hip external rotation     Knee flexion     Knee extension     Ankle dorsiflexion     Ankle plantarflexion     Ankle inversion     Ankle eversion      (Blank rows = not tested)  LOWER EXTREMITY MMT:  MMT Right eval Left eval Left 07/05/23 Left  07/19/23 Left 07/21/23  Hip flexion 3 3 4   4+  Hip extension 3 3     Hip abduction Seated 3 seated3 4 seated 4- side lying  4+  Hip adduction Seated 4-  Seated 4-     Hip internal rotation       Hip external rotation       Knee flexion 4 4   4+  Knee extension 4 4   5   Ankle dorsiflexion       Ankle plantarflexion       Ankle inversion       Ankle eversion        (Blank rows = not tested)  FUNCTIONAL TESTS:  5 times sit to stand: 20 seconds with significant use of hands pushing up from chair. 07/05/23: 14 sec with UE to rise 07/12/23: 14.8 without UE Timed up and go (TUG): 31 seconds : 07/14/23:  17 sec without AD 07/19/23: SLS 7 sec each  GAIT: Distance walked: 85 ft from waiting area to treatment room  Assistive device utilized: Walker - 2 wheeled Level of assistance: Modified independence Comments: decreased stride, flexed trunk   [facility] Adult PT Treatment:                                                DATE: 07/21/2023 Therapeutic Exercise: STS 2x12 Nustep 8' Glute med straight leg 3x10 Heel raise on 2' platform 2x10 Therapeutic Activity: Manual measurements for functional goals Gait training with adjusted DME    [facility] Adult PT Treatment:                                                DATE: 07/19/23  Therapeutic Exercise: Nustep L5 UE /LE x 5 minutes Knee ext 10# x 15, 15# x 10 Knee flex 35# 10 x 2  Heel raises x 20 Side hip abduction 3 x 10 Bridge 12 x 3 SLR 10 x 2 Left  SLS 7 sec best each leg  Therapeutic Activity: Reciprocal stair negotiation with single UE support - good safety FOTO 70%    [facility] Adult PT Treatment:  DATE: 07/14/23 Therapeutic Exercise: Nustep L5 UE/LE x 5 minutes  STS 10 x 2 - min cues for avoiding rock back / momentum Knee ext 10# 10 x 3  Knee flex 25# 10 x 3 Runners Step up 6 inches 10 x 2 - 1 UE touch SLR 10 x 3 Left, 10 x 2 right  (anterior right hip pain)   Therapeutic Activity: TUG: 17 sec without AD Gait with SPC 185 feet - able to keep consistent sequencing with min cues for equal step length    PATIENT  EDUCATION:  Education details: evaluation findings, POC, goals, HEP with proper form/ rationale.  Person educated: Patient Education method: Explanation, Verbal cues, and Handouts Education comprehension: verbalized understanding  HOME EXERCISE PROGRAM: Access Code: [email or web address] URL: [email or web address] Date: 06/15/2023 Prepared by: Joellyn Rued  Exercises - Supine Bridge  - 1 x daily - 7 x weekly - 2 sets - 10 reps - Hooklying Clamshell with Resistance  - 1 x daily - 7 x weekly - 2 sets - 15 reps - Supine March  - 1 x daily - 7 x weekly - 2 sets - 10 reps - 5 hold - Seated Gluteal Sets  - 1 x daily - 7 x weekly - 2 sets - 10 reps - Hooklying Single Knee to Chest Stretch  - 1 x daily - 7 x weekly - 2 sets - 2 reps - 30 seconds hold - Active Straight Leg Raise with Quad Set  - 1 x daily - 7 x weekly - 2 sets - 10 reps - 3 hold - Seated Long Arc Quad  - 1 x daily - 7 x weekly - 2 sets - 10 reps - 3 hold - Sidelying Hip Abduction  - 1 x daily - 7 x weekly - 3 sets - 10 reps - Standing Single Leg Stance with Counter Support  - 1 x daily - 7 x weekly - 1 sets - 3 reps - 10 hold  ASSESSMENT:  CLINICAL IMPRESSION: Pt returned to clinic for continuation of rehab of L THA. Pt demonstrated Global L hip ROM and strength consistent with PLOF. Pt was educated on gait pattern and demonstrated compentency with home use of adjusted FWW and HEP. Pt verbalized understanding and had no further questions for therapist prior to d/c. Pt has met all therapuetic goals and is to be discharged following completion of today's session.   EVAL: Patient is a 77 y.o. F who was seen today for physical therapy evaluation and treatment for s/p L THA on 05/16/23.Patient exhibits limited hip flexion/ abduction AROM secondary to weakness, and limited hip extension secondary to anterior stiffness. MMT reveals gross L hip weakness, pt was unable to perform sit to stand without UE assist and completed 5 x STS using  bil UE in 20 seconds and a TUG with RW at 31 seconds indicating higher risk of falling. Patient would benefit from physical therapy to promote hip mobility, increase strength and stability, maximize her function by addressing the deficits listed.   OBJECTIVE IMPAIRMENTS: decreased activity tolerance, decreased balance, decreased endurance, decreased ROM, decreased strength, impaired flexibility, postural dysfunction, and pain.   ACTIVITY LIMITATIONS: carrying, lifting, standing, squatting, stairs, and locomotion level  PARTICIPATION LIMITATIONS: shopping and community activity  PERSONAL FACTORS: Age and 1-2 comorbidities: CX, CHF  are also affecting patient's functional outcome.   REHAB POTENTIAL: Good  CLINICAL DECISION MAKING: Evolving/moderate complexity  EVALUATION COMPLEXITY: Moderate   GOALS: Goals reviewed with patient? Yes  SHORT TERM GOALS:  Target date: 07/20/2023   Pt to be IND with initial HEP for therapeutic progression Baseline: no previous HEP 06/17/23: min verbal cueing Goal status: MET  2.  Pt to increase L hip flexor/ abductor strength to 3+/5 to promote hip stability  Baseline: see flow sheet 07/05/23: 4-4-/5 Goal status: MET  3.  Increase L hip flexion to >/= 90 degrees  Baseline: see flowsheet 06/23/23: 100 Goal status: MET  4.  Pt to be able to perform 5 x STS with </= 20 seconds with minimal to no UE assistance Baseline: 20 seconds with significant UE use 07/05/23: needs UE to rise standard chair 07/12/23: 14.8 without UE Goal status: MET  LONG TERM GOALS: Target date: 08/31/2023  Improve L gross hip strength to >/= 4/5 to promote stability and safety with walking/ standing  Baseline: see flow sheet 07/19/23: 4-/5 Goal status: MET 07/21/2023   2.  Pt to improve TUG to </= 18 seconds with LRAD to demonstrate improvement in mobility and function Baseline: initial score 31 seconds with 31 seconds 07/14/23: 17.0 sec without AD Goal status:  MET  3.  Pt to be able to walk/ standing with LRAD for >/= 45 min for endurance required ADLS and community ambulation  Baseline:  07/19/23: pt does not go out much. Was able to go to the store for 20 minutes, used RW, thinks she would do fine Goal status: MET 07/21/2023   4.  Pt to be able to perform 5 x STS in </= 15 seconds with no UE use for improvement in function. Baseline:  07/12/23: 14.8 sec Goal status: MET  5.  Pt to be IND with all HEP and is able to maintain and progress her current LOF IND.  Baseline:  Goal status: MET 07/21/2023   PLAN:  PT FREQUENCY: 1-2x/week  PT DURATION: 12 weeks  PLANNED INTERVENTIONS: 97164- PT Re-evaluation, 97110-Therapeutic exercises, 97530- Therapeutic activity, 97112- Neuromuscular re-education, 97535- Self Care, 86578- Manual therapy, [id number]- Gait training, [phone]- Aquatic Therapy, Stair training, Dry Needling, Joint mobilization, Cryotherapy, and Moist heat  PLAN FOR NEXT SESSION: D/C 07/21/2023      Lulu Riding PT, DPT, LAT, ATC  07/21/23  3:51 PM

## 2023-07-23 ENCOUNTER — Other Ambulatory Visit (HOSPITAL_COMMUNITY): Payer: Self-pay | Admitting: Cardiology

## 2023-07-23 DIAGNOSIS — I5032 Chronic diastolic (congestive) heart failure: Secondary | ICD-10-CM

## 2023-07-25 ENCOUNTER — Other Ambulatory Visit (HOSPITAL_COMMUNITY): Payer: Self-pay

## 2023-07-25 DIAGNOSIS — I5032 Chronic diastolic (congestive) heart failure: Secondary | ICD-10-CM

## 2023-07-25 MED ORDER — FUROSEMIDE 20 MG PO TABS: ORAL_TABLET | ORAL | 6 refills | Status: DC

## 2023-08-02 DIAGNOSIS — R059 Cough, unspecified: Secondary | ICD-10-CM | POA: Diagnosis not present

## 2023-08-02 DIAGNOSIS — Z03818 Encounter for observation for suspected exposure to other biological agents ruled out: Secondary | ICD-10-CM | POA: Diagnosis not present

## 2023-08-02 DIAGNOSIS — R252 Cramp and spasm: Secondary | ICD-10-CM | POA: Diagnosis not present

## 2023-08-03 ENCOUNTER — Other Ambulatory Visit (HOSPITAL_COMMUNITY): Payer: Self-pay | Admitting: Cardiology

## 2023-08-09 ENCOUNTER — Other Ambulatory Visit: Payer: Self-pay

## 2023-08-09 NOTE — Progress Notes (Signed)
 Specialty Pharmacy Refill Coordination Note  Katie [name] is a 78 y.o. female contacted today regarding refills of specialty medication(s) Tafamidis  (Vyndamax )   Patient requested Delivery   Delivery date: 08/12/23   Verified address: [address]   [place] [address]   Medication will be filled on 08/11/23.

## 2023-08-11 ENCOUNTER — Telehealth (HOSPITAL_COMMUNITY): Payer: Self-pay | Admitting: Pharmacy Technician

## 2023-08-11 NOTE — Telephone Encounter (Signed)
 Patient Advocate Encounter   Received notification from [employer] that prior authorization for Vyndamax is required.   PA submitted on CoverMyMeds Key  [id number] Status is pending   Will continue to follow.

## 2023-08-12 NOTE — Telephone Encounter (Signed)
 Advanced Heart Failure Patient Advocate [employer] requested additional information. This was sent via fax, [phone].  Will follow up.

## 2023-08-16 ENCOUNTER — Other Ambulatory Visit (HOSPITAL_COMMUNITY): Payer: Self-pay

## 2023-08-16 NOTE — Telephone Encounter (Signed)
 Advanced Heart Failure Patient Advocate Encounter  PA for Vyndamax was denied. Appeal submitted via phone. PA Case ID - [id number]. Standard turn around time, 7 calendar days.  Will follow up.

## 2023-08-18 ENCOUNTER — Other Ambulatory Visit (HOSPITAL_COMMUNITY): Payer: Self-pay

## 2023-08-18 ENCOUNTER — Other Ambulatory Visit: Payer: Self-pay

## 2023-08-29 ENCOUNTER — Other Ambulatory Visit (HOSPITAL_COMMUNITY): Payer: Self-pay

## 2023-08-29 NOTE — Telephone Encounter (Signed)
Advanced Heart Failure Patient Advocate Encounter  Called patients insurance for a status update and prior authorization appeal for Vyndamax has been approved.    PA# [id number]  Effective dates: 08/11/23 through 08/01/24  Archer Asa, CPhT

## 2023-08-31 ENCOUNTER — Other Ambulatory Visit: Payer: Self-pay

## 2023-08-31 NOTE — Progress Notes (Signed)
Specialty Pharmacy Refill Coordination Note  Katie [name] is a 78 y.o. female contacted today regarding refills of specialty medication(s) Tafamidis [name]   Patient requested Delivery   Delivery date: 09/08/23   Verified address: [address]   [place] [address]   Medication will be filled on 09/07/23.

## 2023-09-05 ENCOUNTER — Ambulatory Visit: Payer: 59 | Admitting: Neurology

## 2023-09-07 ENCOUNTER — Other Ambulatory Visit: Payer: Self-pay

## 2023-09-12 ENCOUNTER — Other Ambulatory Visit (HOSPITAL_COMMUNITY): Payer: Self-pay

## 2023-09-14 ENCOUNTER — Other Ambulatory Visit: Payer: Self-pay | Admitting: Internal Medicine

## 2023-09-14 DIAGNOSIS — N1832 Chronic kidney disease, stage 3b: Secondary | ICD-10-CM | POA: Diagnosis not present

## 2023-09-14 DIAGNOSIS — E2839 Other primary ovarian failure: Secondary | ICD-10-CM

## 2023-09-14 DIAGNOSIS — M8580 Other specified disorders of bone density and structure, unspecified site: Secondary | ICD-10-CM | POA: Diagnosis not present

## 2023-09-14 DIAGNOSIS — E039 Hypothyroidism, unspecified: Secondary | ICD-10-CM | POA: Diagnosis not present

## 2023-09-14 DIAGNOSIS — R5381 Other malaise: Secondary | ICD-10-CM

## 2023-09-14 DIAGNOSIS — H401131 Primary open-angle glaucoma, bilateral, mild stage: Secondary | ICD-10-CM | POA: Diagnosis not present

## 2023-09-14 DIAGNOSIS — I5032 Chronic diastolic (congestive) heart failure: Secondary | ICD-10-CM | POA: Diagnosis not present

## 2023-09-14 DIAGNOSIS — E854 Organ-limited amyloidosis: Secondary | ICD-10-CM | POA: Diagnosis not present

## 2023-09-14 DIAGNOSIS — E7800 Pure hypercholesterolemia, unspecified: Secondary | ICD-10-CM | POA: Diagnosis not present

## 2023-09-14 DIAGNOSIS — Z0000 Encounter for general adult medical examination without abnormal findings: Secondary | ICD-10-CM | POA: Diagnosis not present

## 2023-09-14 DIAGNOSIS — M87059 Idiopathic aseptic necrosis of unspecified femur: Secondary | ICD-10-CM | POA: Diagnosis not present

## 2023-09-14 DIAGNOSIS — Z23 Encounter for immunization: Secondary | ICD-10-CM | POA: Diagnosis not present

## 2023-09-14 DIAGNOSIS — E8582 Wild-type transthyretin-related (ATTR) amyloidosis: Secondary | ICD-10-CM | POA: Diagnosis not present

## 2023-09-14 DIAGNOSIS — Z85528 Personal history of other malignant neoplasm of kidney: Secondary | ICD-10-CM | POA: Diagnosis not present

## 2023-09-14 DIAGNOSIS — R413 Other amnesia: Secondary | ICD-10-CM | POA: Diagnosis not present

## 2023-09-14 DIAGNOSIS — Z905 Acquired absence of kidney: Secondary | ICD-10-CM | POA: Diagnosis not present

## 2023-09-14 DIAGNOSIS — G63 Polyneuropathy in diseases classified elsewhere: Secondary | ICD-10-CM | POA: Diagnosis not present

## 2023-09-14 DIAGNOSIS — I272 Pulmonary hypertension, unspecified: Secondary | ICD-10-CM | POA: Diagnosis not present

## 2023-09-20 ENCOUNTER — Encounter: Payer: Self-pay | Admitting: Neurology

## 2023-09-20 ENCOUNTER — Ambulatory Visit (INDEPENDENT_AMBULATORY_CARE_PROVIDER_SITE_OTHER): Payer: 59 | Admitting: Neurology

## 2023-09-20 VITALS — BP 119/74 | HR 74 | Ht 65.0 in | Wt 155.4 lb

## 2023-09-20 DIAGNOSIS — R413 Other amnesia: Secondary | ICD-10-CM | POA: Diagnosis not present

## 2023-09-20 DIAGNOSIS — F0280 Dementia in other diseases classified elsewhere without behavioral disturbance: Secondary | ICD-10-CM

## 2023-09-20 DIAGNOSIS — R7983 Abnormal findings of blood amino-acid level: Secondary | ICD-10-CM | POA: Diagnosis not present

## 2023-09-20 DIAGNOSIS — G309 Alzheimer's disease, unspecified: Secondary | ICD-10-CM

## 2023-09-20 DIAGNOSIS — F0150 Vascular dementia without behavioral disturbance: Secondary | ICD-10-CM | POA: Diagnosis not present

## 2023-09-20 NOTE — Progress Notes (Addendum)
[facility] [address] [place]. [place] [address] [phone]       OFFICE FOLLOW UP VISIT NOTE  [name] Date of Birth:  [birth year] Medical Record Number:  [id number]   Referring MD:  Renford Dills  Reason for Referral:  memory loss  HPI: [name] is a 78 year old pleasant African-American lady seen today for initial office consultation visit for memory loss.  She is accompanied by her daughter [name] today.  History is obtained from them and review of electronic medical records.  I personally reviewed pertinent available imaging films in PACS.  He has past medical history of amyloid cardiac disease, congestive heart failure, chronic kidney disease, hypertension, hyperlipidemia, hypothyroidism, mitral regurgitation, pulmonary hypertension.  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  Residual deficits.,  Peripheral neuropathy for which she follows up with Dr. Nita Sickle.  Patient states that she is not having significant memory or cognitive difficulties however the daughter has noticed for the last 1 year or so that she is forgetful.  She often forgets her appointments.  She is   often disoriented and confuses easily.  She is still quite independent and lives by herself.  She drives a little.  He has not exhibited any unsafe behavior.  He does get frustrated easily at times particularly when she cannot find the right or complete sentences.  Any delusions, hallucinations agitation.  Any headaches, gait or balance difficulties.  She has history of dementia and Parkinson's close as well as mother She has history of left cerebellar stroke in 2018 due to distal basilar artery occlusion she did remarkably well with mild residual difficulties in gait and balance..  She does see Dr Allena Katz local  neurologist for follow-up peripheral neuropathy.     She has had no recent falls or injuries.  She is on Plavix for stroke prevention and tolerating it  well without it.  Her blood pressure cholesterol sugar has not been under good lipid profile on 10/04/2018 echocardiogram on 10/26/22 showed ejection fraction of 55% but severely dilated left atrium. Update 09/20/2023 : She returns for follow-up after last visit 6 months ago.  She is accompanied by her daughter.  They states she is tolerating Namenda well without dizziness, drowsiness or other side effects.  They have noticed slight improvement in her cognition.  Her memory is better.  She is still living alone and is quite independent in activities of daily living.  She has not exhibited any delirium, confusion, disorientation.  She has not shown any agitation or violent behavior.  She has not exhibited any unsafe behavior.  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  She has since started taking folic acid daily.  She has not had any follow-up homocystine labs checked yet.  EEG on 02/21/2023 was normal.  MRI scan of the brain was ordered but for unclear reason has not been done.  She however had a fall on 04/17/2023 and was seen in the ER and had CT scan of the head which showed old left parietal and cerebellar infarcts but no acute finding.  She has no new complaints.  On Mini-Mental status testing today she scored 20/30.  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 s/p right nephrectomy ~2007.   History of loop recorder    Hypercholesterolemia    Hypertension    Hypothyroidism    Mitral  regurgitation    Pulmonary hypertension (HCC)    Stroke (HCC)    no residual    Social History:  Social History   Socioeconomic History   Marital status: Divorced    Spouse name: Not on file   Number of children: 5   Years of education: 12   Highest education level: Not on file  Occupational History   Occupation: retired    Associate Professor: [employer]  Tobacco Use   Smoking status: Never   Smokeless tobacco: Never  Vaping Use   Vaping status: Never Used  Substance and Sexual Activity   Alcohol use: No   Drug use: No   Sexual activity: Not Currently  Other Topics Concern   Not on file  Social History Narrative   Lives alone in a one story home.  Her son stays with her some.  Has 5 children, 10 grandchildren and 10 great grandchildren.  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  Education: high school.        07/14/18- list food insecurity but is aware of food pantries and gets $15 in food stamps, reports issues with medical bills but has insurance and gets extra help through medicaid for premiums- encouraged pt to call billing to set up reasonable payment plan so it doesn't go to collections.      Left Handed   Social Drivers of Health   Financial Resource Strain: Low Risk  (09/04/2019)   Overall Financial Resource Strain (CARDIA)    Difficulty of Paying Living Expenses: Not hard at all  Food Insecurity: No Food Insecurity (05/16/2023)   Hunger Vital Sign    Worried About Running Out of Food in the Last Year: Never true    Ran Out of Food in the Last Year: Never true  Transportation Needs: No Transportation Needs (05/16/2023)   PRAPARE - Administrator, Civil Service (Medical): No    Lack of Transportation (Non-Medical): No  Physical Activity: Not on file  Stress: Not on file  Social Connections: Not on file  Intimate Partner Violence: Not At Risk (05/16/2023)   Humiliation, Afraid, Rape, and Kick questionnaire    Fear of Current or Ex-Partner: No    Emotionally  Abused: No    Physically Abused: No    Sexually Abused: No    Medications:   Current Outpatient Medications on File Prior to Visit  Medication Sig Dispense Refill   acetaminophen (TYLENOL) 500 MG tablet Take 1,000 mg by mouth daily.     aspirin 81 MG tablet Take 1 tablet (81 mg total) daily by mouth.     Bempedoic Acid-Ezetimibe (NEXLIZET) 180-10 MG TABS Take 1 tablet by mouth daily. (Patient taking differently: Take 1 tablet by mouth every evening.) 90 tablet 3   carvedilol (COREG) 6.25 MG tablet TAKE 1 TABLET(6.25 MG) BY MOUTH TWICE DAILY WITH A MEAL 180 tablet 3   clopidogrel (PLAVIX) 75 MG tablet Take 1 tablet (75 mg total) by mouth daily. 30 tablet 1   Evolocumab (REPATHA SURECLICK)  140 MG/ML SOAJ Inject 140 mg into the skin every 14 (fourteen) days. 6 mL 3   feeding supplement, ENSURE ENLIVE, (ENSURE ENLIVE) LIQD Take 237 mLs by mouth 2 (two) times daily between meals. 237 mL 0   folic acid (FOLVITE) 1 MG tablet Take 1 tablet (1 mg total) by mouth daily. 100 tablet 3   furosemide (LASIX) 20 MG tablet takes 2 tablets in the morning then take 1 tablet by mouth every evening. 90 tablet 6   gabapentin (NEURONTIN) 300 MG capsule Take 300 mg by mouth 2 (two) times daily.     Hypromellose (ARTIFICIAL TEARS OP) Place 1 drop into both eyes 2 (two) times daily.     JARDIANCE 10 MG TABS tablet TAKE 1 TABLET(10 MG) BY MOUTH DAILY BEFORE BREAKFAST 90 tablet 1   levothyroxine (SYNTHROID, LEVOTHROID) 25 MCG tablet Take 25 mcg by mouth daily before breakfast.     memantine (NAMENDA) 10 MG tablet Take 1 tablet (10 mg total) by mouth 2 (two) times daily. 60 tablet 3   pantoprazole (PROTONIX) 40 MG tablet TAKE 1 TABLET(40 MG) BY MOUTH DAILY 90 tablet 3   potassium chloride SA (KLOR-CON) 20 MEQ tablet Take 1 tablet (20 mEq total) by mouth daily. (Patient taking differently: Take 20 mEq by mouth 2 (two) times daily.) 60 tablet 0   Tafamidis (VYNDAMAX) 61 MG CAPS Take 1 capsule by mouth daily.     Vitamin A  3 MG (10000 UT) TABS Take 3 mg by mouth daily.     vutrisiran sodium (AMVUTTRA) 25 MG/0.5ML syringe Inject 0.5 mLs (25 mg total) into the skin every 3 (three) months. 0.5 mL 3   No current facility-administered medications on file prior to visit.    Allergies:   Allergies  Allergen Reactions   Crestor [Rosuvastatin] Other (See Comments)    Drug interaction w/ tafamidis, caused rhabdomyolysis with CK > 29,000 in 2021   Tramadol Shortness Of Breath and Palpitations    Physical Exam General: well developed, well nourished pleasant elderly African-American lady, seated, in no evident distress Head: head normocephalic and atraumatic.   Neck: supple with no carotid or supraclavicular bruits Cardiovascular: regular rate and rhythm, no murmurs Musculoskeletal: no deformity Skin:  no rash/petichiae Vascular:  Normal pulses all extremities  Neurologic Exam Mental Status: Awake and fully alert. Oriented to place and time. Recent and remote memory poor. Attention span, concentration and fund of knowledge diminished. Mood and affect appropriate.  Recall 0/3.  Clock drawing 324.  Able to name only 9 animals which can walk on 4 legs.  Mental status exam scored 20/30. Cranial Nerves: Fundoscopic exam not done pupils equal, briskly reactive to light. Extraocular movements full without nystagmus. Visual fields full to confrontation. Hearing intact. Facial sensation intact. Face, tongue, palate moves normally and symmetrically.  Motor: Normal bulk and tone. Normal strength in all tested extremity muscles.  Diminished fine finger movements on the right.  Orbits left or right upper extremity. Sensory.: intact to touch , pinprick , position and vibratory sensation.  Coordination: Rapid alternating movements normal in all extremities. Finger-to-nose and heel-to-shin performed accurately bilaterally. Gait and Station: Arises from chair without difficulty. Stance is normal. Gait is broad-based and uses a walker.   Unsteady when standing on either foot unsupported and narrow-based.  Unable to walk tandem. Reflexes: 1+ and symmetric. Toes downgoing.     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     PLAN:I had a long discussion with the patient and her daughter regarding her memory loss and dementia and answered questions.  I recommend she continue Namenda as she seems to be tolerating it well and has noticed some improvement.  Check MRI scan of the brain as for unclear reason it was not done last visit.  Continue folic acid for elevated homocystine and check levels today.  Encouraged her to continue participation in cognitively challenging activities like solving crossword puzzles, playing bridge and sudoku.  Continue Plavix for stroke prevention and maintain aggressive risk factor modification.  Continue to use a cane for ambulation at all times.  Return for follow-up with me in the future in 6 months or call earlier if necessary.  Greater than 50% time during this consultation visit were spent in counseling and coordination of care about her memory loss and dementia and remote stroke and neuropathy and answering questions  Delia Heady, MD Note: This document was prepared with digital dictation and possible smart phrase technology.  Any transcriptional errors that result from this process are unintentional.

## 2023-09-20 NOTE — Patient Instructions (Signed)
I had a long discussion with the patient and her daughter regarding her memory loss and dementia and answered questions.  I recommend she continue Namenda as she seems to be tolerating it well and has noticed some improvement.  Check MRI scan of the brain as for unclear reason it was not done last visit.  Continue folic acid for elevated homocystine and check levels today.  Encouraged her to continue participation in cognitively challenging activities like solving crossword puzzles, playing bridge and sudoku.  Continue Plavix for stroke prevention and maintain aggressive risk factor modification.  Continue to use a cane for ambulation at all times.  Return for follow-up with me in the future in 6 months or call earlier if necessary.

## 2023-09-21 LAB — HOMOCYSTEINE: Homocysteine: 32.9 umol/L — ABNORMAL HIGH (ref 0.0–19.2)

## 2023-09-22 ENCOUNTER — Telehealth: Payer: Self-pay

## 2023-09-22 ENCOUNTER — Other Ambulatory Visit: Payer: Self-pay

## 2023-09-22 ENCOUNTER — Other Ambulatory Visit (HOSPITAL_COMMUNITY): Payer: Self-pay

## 2023-09-22 ENCOUNTER — Other Ambulatory Visit: Payer: Self-pay | Admitting: Neurology

## 2023-09-22 MED ORDER — CEREFOLIN 6-1-50-5 MG PO TABS: 1.0000 | ORAL_TABLET | Freq: Every day | ORAL | 3 refills | Status: AC

## 2023-09-22 NOTE — Telephone Encounter (Signed)
Contacted pt daughter regarding labs, per DPR, vm full.

## 2023-09-22 NOTE — Progress Notes (Signed)
Kindly inform patient that homocysteine levels are still high. If she is taking folic acid 1 mg daily it is not working and I will change her to Cerefolin nac instead. This is a high dose combination vitamin availalable from a mail order pharmacy in [place]. She will have to send online payment before they will ship it to her

## 2023-09-22 NOTE — Telephone Encounter (Signed)
-----   Message from Katie [name] sent at 09/22/2023  8:13 AM EST ----- Kindly inform patient that homocysteine levels are still high. If she is taking folic acid 1 mg daily it is not working and I will change her to Cerefolin nac instead. This is a high dose combination vitamin availalable from a mail order pharmacy in [place]. She will have to send online payment before they will ship it to her

## 2023-09-22 NOTE — Progress Notes (Signed)
Specialty Pharmacy Refill Coordination Note  Katie [name] is a 78 y.o. female contacted today regarding refills of specialty medication(s) Vutrisiran Sodium [name])   Patient requested Delivery   Delivery date: 10/06/23   Verified address: [facility] [address]   Medication will be filled on 03.05.25.

## 2023-09-26 ENCOUNTER — Telehealth: Payer: Self-pay | Admitting: Neurology

## 2023-09-26 ENCOUNTER — Other Ambulatory Visit: Payer: Self-pay

## 2023-09-26 NOTE — Telephone Encounter (Signed)
 [employer] medicare NPR sent to [facility] [phone]

## 2023-09-27 ENCOUNTER — Other Ambulatory Visit (HOSPITAL_COMMUNITY): Payer: Self-pay | Admitting: Pharmacist

## 2023-09-27 ENCOUNTER — Other Ambulatory Visit: Payer: Self-pay

## 2023-09-27 MED ORDER — VYNDAMAX 61 MG PO CAPS
61.0000 mg | ORAL_CAPSULE | Freq: Every day | ORAL | 11 refills | Status: AC
  Filled 2023-09-27 (×2): qty 30, 30d supply, fill #0
  Filled 2023-10-17: qty 30, 30d supply, fill #1
  Filled 2023-11-21: qty 30, 30d supply, fill #2
  Filled 2024-01-02: qty 30, 30d supply, fill #3
  Filled 2024-02-10 – 2024-03-13 (×2): qty 30, 30d supply, fill #4
  Filled 2024-04-10 (×2): qty 30, 30d supply, fill #5
  Filled 2024-05-07: qty 30, 30d supply, fill #6
  Filled 2024-05-31: qty 30, 30d supply, fill #7
  Filled 2024-07-02: qty 30, 30d supply, fill #8
  Filled 2024-07-25: qty 30, 30d supply, fill #9
  Filled 2024-08-29: qty 30, 30d supply, fill #10

## 2023-09-27 NOTE — Progress Notes (Signed)
 Specialty Pharmacy Refill Coordination Note  Katie [name] is a 78 y.o. female contacted today regarding refills of specialty medication(s) Tafamidis [name]   Patient requested Delivery   Delivery date: 10/03/23   Verified address: [address]   Medication will be filled on 09/30/23.

## 2023-09-29 DIAGNOSIS — C641 Malignant neoplasm of right kidney, except renal pelvis: Secondary | ICD-10-CM | POA: Diagnosis not present

## 2023-09-29 DIAGNOSIS — I129 Hypertensive chronic kidney disease with stage 1 through stage 4 chronic kidney disease, or unspecified chronic kidney disease: Secondary | ICD-10-CM | POA: Diagnosis not present

## 2023-09-29 DIAGNOSIS — I340 Nonrheumatic mitral (valve) insufficiency: Secondary | ICD-10-CM | POA: Diagnosis not present

## 2023-09-29 DIAGNOSIS — I43 Cardiomyopathy in diseases classified elsewhere: Secondary | ICD-10-CM | POA: Diagnosis not present

## 2023-09-29 DIAGNOSIS — N184 Chronic kidney disease, stage 4 (severe): Secondary | ICD-10-CM | POA: Diagnosis not present

## 2023-09-29 DIAGNOSIS — I5032 Chronic diastolic (congestive) heart failure: Secondary | ICD-10-CM | POA: Diagnosis not present

## 2023-09-29 DIAGNOSIS — E854 Organ-limited amyloidosis: Secondary | ICD-10-CM | POA: Diagnosis not present

## 2023-09-29 DIAGNOSIS — E785 Hyperlipidemia, unspecified: Secondary | ICD-10-CM | POA: Diagnosis not present

## 2023-09-30 ENCOUNTER — Emergency Department (HOSPITAL_COMMUNITY): Payer: 59

## 2023-09-30 ENCOUNTER — Emergency Department (HOSPITAL_COMMUNITY)
Admission: EM | Admit: 2023-09-30 | Discharge: 2023-09-30 | Disposition: A | Discharge status: Home / Self Care | Payer: 59 | Attending: Emergency Medicine | Admitting: Emergency Medicine

## 2023-09-30 ENCOUNTER — Other Ambulatory Visit: Payer: Self-pay

## 2023-09-30 ENCOUNTER — Encounter (HOSPITAL_COMMUNITY): Payer: Self-pay | Admitting: Emergency Medicine

## 2023-09-30 DIAGNOSIS — I509 Heart failure, unspecified: Secondary | ICD-10-CM | POA: Insufficient documentation

## 2023-09-30 DIAGNOSIS — K5730 Diverticulosis of large intestine without perforation or abscess without bleeding: Secondary | ICD-10-CM | POA: Diagnosis not present

## 2023-09-30 DIAGNOSIS — Z905 Acquired absence of kidney: Secondary | ICD-10-CM | POA: Diagnosis not present

## 2023-09-30 DIAGNOSIS — R112 Nausea with vomiting, unspecified: Secondary | ICD-10-CM | POA: Insufficient documentation

## 2023-09-30 DIAGNOSIS — I130 Hypertensive heart and chronic kidney disease with heart failure and stage 1 through stage 4 chronic kidney disease, or unspecified chronic kidney disease: Secondary | ICD-10-CM | POA: Diagnosis not present

## 2023-09-30 DIAGNOSIS — N189 Chronic kidney disease, unspecified: Secondary | ICD-10-CM | POA: Diagnosis not present

## 2023-09-30 DIAGNOSIS — R109 Unspecified abdominal pain: Secondary | ICD-10-CM

## 2023-09-30 DIAGNOSIS — R000 Tachycardia, unspecified: Secondary | ICD-10-CM | POA: Diagnosis not present

## 2023-09-30 DIAGNOSIS — Z7982 Long term (current) use of aspirin: Secondary | ICD-10-CM | POA: Diagnosis not present

## 2023-09-30 DIAGNOSIS — Z7901 Long term (current) use of anticoagulants: Secondary | ICD-10-CM | POA: Diagnosis not present

## 2023-09-30 DIAGNOSIS — E039 Hypothyroidism, unspecified: Secondary | ICD-10-CM | POA: Diagnosis not present

## 2023-09-30 DIAGNOSIS — K7290 Hepatic failure, unspecified without coma: Secondary | ICD-10-CM | POA: Diagnosis not present

## 2023-09-30 DIAGNOSIS — R1013 Epigastric pain: Secondary | ICD-10-CM | POA: Diagnosis not present

## 2023-09-30 DIAGNOSIS — I10 Essential (primary) hypertension: Secondary | ICD-10-CM | POA: Diagnosis not present

## 2023-09-30 LAB — URINALYSIS, ROUTINE W REFLEX MICROSCOPIC
Bacteria, UA: NONE SEEN
Bilirubin Urine: NEGATIVE
Glucose, UA: >=500 mg/dL — AB
Hgb urine dipstick: NEGATIVE
Ketones, ur: NEGATIVE mg/dL
Leukocytes,Ua: NEGATIVE
Nitrite: NEGATIVE
Protein, ur: NEGATIVE mg/dL
Specific Gravity, Urine: 1.015 (ref 1.005–1.030)
pH: 6 (ref 5.0–8.0)

## 2023-09-30 LAB — COMPREHENSIVE METABOLIC PANEL
ALT: 12 U/L (ref 0–44)
AST: 23 U/L (ref 15–41)
Albumin: 3.6 g/dL (ref 3.5–5.0)
Alkaline Phosphatase: 120 U/L (ref 38–126)
Anion gap: 12 (ref 5–15)
BUN: 28 mg/dL — ABNORMAL HIGH (ref 8–23)
CO2: 20 mmol/L — ABNORMAL LOW (ref 22–32)
Calcium: 9.6 mg/dL (ref 8.9–10.3)
Chloride: 109 mmol/L (ref 98–111)
Creatinine, Ser: 1.9 mg/dL — ABNORMAL HIGH (ref 0.44–1.00)
GFR, Estimated: 27 mL/min — ABNORMAL LOW (ref >60)
Glucose, Bld: 111 mg/dL — ABNORMAL HIGH (ref 70–99)
Potassium: 4.2 mmol/L (ref 3.5–5.1)
Sodium: 141 mmol/L (ref 135–145)
Total Bilirubin: 0.9 mg/dL (ref 0.0–1.2)
Total Protein: 8.7 g/dL — ABNORMAL HIGH (ref 6.5–8.1)

## 2023-09-30 LAB — CBC
HCT: 42.6 % (ref 36.0–46.0)
Hemoglobin: 13.3 g/dL (ref 12.0–15.0)
MCH: 29 pg (ref 26.0–34.0)
MCHC: 31.2 g/dL (ref 30.0–36.0)
MCV: 93 fL (ref 80.0–100.0)
Platelets: 319 10*3/uL (ref 150–400)
RBC: 4.58 MIL/uL (ref 3.87–5.11)
RDW: 15.1 % (ref 11.5–15.5)
WBC: 7.8 10*3/uL (ref 4.0–10.5)
nRBC: 0 % (ref 0.0–0.2)

## 2023-09-30 LAB — LIPASE, BLOOD: Lipase: 50 U/L (ref 11–51)

## 2023-09-30 LAB — RESP PANEL BY RT-PCR (RSV, FLU A&B, COVID)  RVPGX2
Influenza A by PCR: NEGATIVE
Influenza B by PCR: NEGATIVE
Resp Syncytial Virus by PCR: NEGATIVE
SARS Coronavirus 2 by RT PCR: NEGATIVE

## 2023-09-30 MED ORDER — ONDANSETRON 4 MG PO TBDP
4.0000 mg | ORAL_TABLET | Freq: Once | ORAL | Status: AC
  Administered 2023-09-30: 4 mg via ORAL
  Filled 2023-09-30: qty 1

## 2023-09-30 MED ORDER — ACETAMINOPHEN 500 MG PO TABS
1000.0000 mg | ORAL_TABLET | Freq: Once | ORAL | Status: AC
  Administered 2023-09-30: 1000 mg via ORAL
  Filled 2023-09-30: qty 2

## 2023-09-30 MED ORDER — DICYCLOMINE HCL 10 MG PO CAPS
10.0000 mg | ORAL_CAPSULE | Freq: Once | ORAL | Status: AC
  Administered 2023-09-30: 10 mg via ORAL
  Filled 2023-09-30: qty 1

## 2023-09-30 NOTE — ED Notes (Signed)
 Pt consumed apple sauce and ginger ale with no difficulties.

## 2023-09-30 NOTE — ED Provider Notes (Signed)
 Care assumed from Dr. Anitra Lauth.  At time of transfer care, patient awaiting results of CT imaging and urinalysis.  It is reassuring, plan for discharge home as she was already feeling better after having nausea and abdominal discomfort earlier.  CT reassuring other than diverticulosis which we went over and urinalysis did not show UTI.  Patient would like to go home.  They did not want nausea medicine and they would like to leave.  Patient be discharged for outpatient follow-up with PCP and understood return precautions.  Patient discharged in good condition.  Clinical Impression: 1. Nausea and vomiting, unspecified vomiting type   2. Abdominal pain, unspecified abdominal location     Disposition: Discharge  Condition: Good  I have discussed the results, Dx and Tx plan with the pt(& family if present). He/she/they expressed understanding and agree(s) with the plan. Discharge instructions discussed at great length. Strict return precautions discussed and pt &/or family have verbalized understanding of the instructions. No further questions at time of discharge.    New Prescriptions   No medications on file    Follow Up: Renford Dills, MD [address] [place] [address] [phone]     [facility] Emergency Department at [facility] [address] [place] [address] [phone]       Aleana Fifita, Canary Brim, MD 09/30/23 [phone]

## 2023-09-30 NOTE — ED Triage Notes (Addendum)
 Pt presents from home via EMS for N/V since this AM. Endorses L arm pain (elbow, tender with palpation for EMS, during triage exam denies pain), fever Denies diarrhea Last BM this AM.  Pain medication  H/o stroke, htn, renal CA (R kidney removed)  EMS: 162/90, 158/82, 94bpm, 99% RA, 127cbg, no episodes of emesis with ems, no meds en route

## 2023-09-30 NOTE — ED Provider Notes (Signed)
 [place] [facility] AT [facility] Provider Note   CSN: [id number] Arrival date & time: 09/30/23  1003     History  Chief Complaint  Patient presents with   Emesis    Katie [name] is a 78 y.o. female.  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  It started after she took her morning medications.  She reports just feeling unwell.  Also said she had a little bit of diarrhea.  Patient reports after arriving here she was given Zofran and reports overall she is feeling better than she was but still has a little bit of nausea and abdominal discomfort.  She has not had a cough or fever that she is aware of.  She denies any chest pain.  She did report a little bit of shortness of breath earlier which is now resolved.  No known sick contacts.  No recent medication changes.  Her daughter who is present at bedside did say in the last few weeks she had noticed her appetite had been what it was but short of that had not noticed anything different until the vomiting today.  The history is provided by the patient and a relative.  Emesis      Home Medications Prior to Admission medications   Medication Sig Start Date End Date Taking? Authorizing Provider  acetaminophen (TYLENOL) 500 MG tablet Take 1,000 mg by mouth daily.    [provider]  aspirin 81 MG tablet Take 1 tablet (81 mg total) daily by mouth. 06/14/17   Laurey Morale, MD  Bempedoic Acid-Ezetimibe (NEXLIZET) 180-10 MG TABS Take 1 tablet by mouth daily. Patient taking differently: Take 1 tablet by mouth every evening. 04/11/23   Laurey Morale, MD  carvedilol  (COREG) 6.25 MG tablet TAKE 1 TABLET(6.25 MG) BY MOUTH TWICE DAILY WITH A MEAL 07/07/23   [place], [place], FNP  clopidogrel (PLAVIX) 75 MG tablet Take 1 tablet (75 mg total) by mouth daily. 02/13/17   Lahoma Crocker, MD  Evolocumab (REPATHA SURECLICK) 140 MG/ML SOAJ Inject 140 mg into the skin every 14 (fourteen) days. 04/11/23   Laurey Morale, MD  feeding supplement, ENSURE ENLIVE, (ENSURE ENLIVE) LIQD Take 237 mLs by mouth 2 (two) times daily between meals. 02/17/17   Joseph Art, DO  folic acid (FOLVITE) 1 MG tablet Take 1 tablet (1 mg total) by mouth daily. 01/28/23 01/28/24  Micki Riley, MD  furosemide (LASIX) 20 MG tablet takes 2 tablets in the morning then take 1 tablet by mouth every evening. 07/25/23   Laurey Morale, MD  gabapentin (NEURONTIN) 300 MG capsule Take 300 mg by mouth 2 (two) times daily.    [provider]  Hypromellose (ARTIFICIAL TEARS OP) Place 1 drop into both eyes 2 (two) times daily.    [provider]  JARDIANCE 10 MG TABS tablet TAKE 1 TABLET(10 MG) BY MOUTH DAILY BEFORE BREAKFAST 08/04/23   Laurey Morale, MD  L-Methylfolate-B12-B6-B2 (CEREFOLIN) 12-31-48-5 MG TABS Take 1 tablet by mouth daily. 09/22/23   Micki Riley, MD  levothyroxine (SYNTHROID, LEVOTHROID) 25 MCG tablet Take 25 mcg by mouth daily before breakfast.    [provider]  memantine [facility])  10 MG tablet Take 1 tablet (10 mg total) by mouth 2 (two) times daily. 01/26/23   Micki Riley, MD  pantoprazole (PROTONIX) 40 MG tablet TAKE 1 TABLET(40 MG) BY MOUTH DAILY 03/09/23   Laurey Morale, MD  potassium chloride SA (KLOR-CON) 20 MEQ tablet Take 1 tablet (20 mEq total) by mouth daily. Patient taking differently: Take 20 mEq by mouth 2 (two) times daily. 06/16/20   Zannie Cove, MD  Tafamidis [facility]) 61 MG CAPS Take 1 capsule (61 mg total) by mouth daily. 09/27/23   Laurey Morale, MD  Vitamin A 3 MG (10000 UT) TABS Take 3 mg by mouth daily.    [provider]  vutrisiran sodium (AMVUTTRA) 25 MG/0.5ML syringe Inject 0.5 mLs (25 mg total) into the skin every 3 (three) months. 06/09/23   Laurey Morale, MD      Allergies    Crestor [rosuvastatin] and Tramadol    Review of Systems   Review of Systems  Gastrointestinal:  Positive for vomiting.    Physical Exam Updated Vital Signs BP 91/63   Pulse 95   Temp 97.7 F (36.5 C)   Resp 18   Wt 70 kg   LMP 02/12/2013   SpO2 100%   BMI 25.68 kg/m  Physical Exam Vitals and nursing note reviewed.  Constitutional:      General: She is not in acute distress.    Appearance: She is well-developed.  HENT:     Head: Normocephalic and atraumatic.     Mouth/Throat:     Mouth: Mucous membranes are dry.  Eyes:     Pupils: Pupils are equal, round, and reactive to light.  Cardiovascular:     Rate and Rhythm: Normal rate and regular rhythm.     Heart sounds: Normal heart sounds. No murmur heard.    No friction rub.  Pulmonary:     Effort: Pulmonary effort is normal.     Breath sounds: Normal breath sounds. No wheezing or rales.  Abdominal:     General: Bowel sounds are normal. There is no distension.     Palpations: Abdomen is soft.     Tenderness: There is abdominal tenderness in the epigastric area. There is no guarding or rebound.  Musculoskeletal:        General: No tenderness. Normal range of motion.     Comments: No edema  Skin:    General: Skin is warm and dry.     Findings: No rash.  Neurological:     Mental Status: She is alert and oriented to person, place, and time.     Cranial Nerves: No cranial nerve deficit.  Psychiatric:        Behavior: Behavior normal.     ED Results / Procedures / Treatments   Labs (all labs ordered are listed, but only abnormal results are displayed) Labs Reviewed  COMPREHENSIVE METABOLIC PANEL - Abnormal; Notable for the following components:      Result Value   CO2 20 (*)    Glucose, Bld 111 (*)    BUN 28 (*)    Creatinine, Ser  1.90 (*)    Total Protein 8.7 (*)    GFR, Estimated 27 (*)    All other components within normal limits  RESP PANEL BY RT-PCR (RSV, FLU A&B, COVID)  RVPGX2  LIPASE, BLOOD  CBC  URINALYSIS, ROUTINE W REFLEX MICROSCOPIC    EKG EKG Interpretation Date/Time:  Friday September 30 2023 10:15:07 EST Ventricular Rate:  94  PR Interval:  156 QRS Duration:  92 QT Interval:  364 QTC Calculation: 455 R Axis:   19  Text Interpretation: Sinus rhythm with occasional Premature ventricular complexes Possible Anterior infarct , age undetermined No significant change since last tracing When compared with ECG of 11-Jul-2023 09:01, PREVIOUS ECG IS PRESENT Confirmed by Gwyneth Sprout ([id number]) on 09/30/2023 1:54:37 PM  Radiology No results found.  Procedures Procedures    Medications Ordered in ED Medications  acetaminophen (TYLENOL) tablet 1,000 mg (has no administration in time range)  ondansetron (ZOFRAN-ODT) disintegrating tablet 4 mg (4 mg Oral Given 09/30/23 1057)  dicyclomine (BENTYL) capsule 10 mg (10 mg Oral Given 09/30/23 1057)    ED Course/ Medical Decision Making/ A&P                                 Medical Decision Making Amount and/or Complexity of Data Reviewed Labs: ordered.   Pt with multiple medical problems and comorbidities and presenting today with a complaint that caries a high risk for morbidity and mortality.  Here today with some epigastric pain vomiting and diarrhea.  After Zofran upon being seen in EMS triage patient is improved but does continue to have a little bit of epigastric discomfort.  Concern for possible atypical ACS versus viral etiology versus medication reaction versus electrolyte abnormality, AKI as patient is also had a decreased appetite in the last few weeks.  Also concern for possible UTI.  I independently interpreted patient's labs and EKG.  EKG today without acute changes.  Lipase within normal limits, CMP with creatinine of 1.9 which is within  patient's baseline of 1.8-2.2 with normal electrolytes, CBC within normal limits, viral panel is negative.  Patient's initial blood pressure was 91/63 upon arrival here however will repeat vitals and if it remains low we will give IV fluids.  CT abdomen pelvis is pending.         Final Clinical Impression(s) / ED Diagnoses Final diagnoses:  None    Rx / DC Orders ED Discharge Orders     None         Gwyneth Sprout, MD 09/30/23 1528

## 2023-09-30 NOTE — ED Provider Triage Note (Signed)
 Emergency Medicine Provider Triage Evaluation Note  Katie [name] , a 78 y.o. female  was evaluated in triage.  Pt complains of emesis. Patient reports multiple episodes of nausea, vomiting, and diarrhea this morning with diffuse abdominal pain. No fevers or known sick contact.  Review of Systems  Positive: N/V/D, abdominal pain Negative: Fevers  Physical Exam  BP 91/63   Pulse 95   Temp 97.7 F (36.5 C)   Resp 18   Wt 70 kg   LMP 02/12/2013   SpO2 100%   BMI 25.68 kg/m  Gen:   Awake, no distress  Resp:  Normal effort  MSK:   Moves extremities without difficulty  Other:    Medical Decision Making  Medically screening exam initiated at 10:29 AM.  Appropriate orders placed.  Katie [name] was informed that the remainder of the evaluation will be completed by another provider, this initial triage assessment does not replace that evaluation, and the importance of remaining in the ED until their evaluation is complete.    Maxwell Marion, PA-C 09/30/23 1031

## 2023-09-30 NOTE — Discharge Instructions (Signed)
 Your history, exam, workup today was overall reassuring.  The CT scan showed evidence of diverticulosis but no diverticulitis.  The urinalysis did not show UTI and the rest of your labs were overall similar to prior.  Please rest and stay hydrated and follow-up with your primary doctor.  If any symptoms change or worsen acutely, please return to the nearest emergency department.

## 2023-09-30 NOTE — ED Notes (Signed)
 Unsuccessful attempt x1 to start IV

## 2023-10-03 ENCOUNTER — Other Ambulatory Visit (HOSPITAL_COMMUNITY): Payer: Self-pay

## 2023-10-05 ENCOUNTER — Other Ambulatory Visit: Payer: Self-pay

## 2023-10-10 ENCOUNTER — Ambulatory Visit (HOSPITAL_COMMUNITY)
Admission: RE | Admit: 2023-10-10 | Discharge: 2023-10-10 | Disposition: A | Discharge status: Home / Self Care | Payer: 59 | Source: Ambulatory Visit | Attending: Cardiology

## 2023-10-10 DIAGNOSIS — N189 Chronic kidney disease, unspecified: Secondary | ICD-10-CM | POA: Diagnosis not present

## 2023-10-10 DIAGNOSIS — Z79899 Other long term (current) drug therapy: Secondary | ICD-10-CM | POA: Diagnosis not present

## 2023-10-10 DIAGNOSIS — G629 Polyneuropathy, unspecified: Secondary | ICD-10-CM | POA: Diagnosis not present

## 2023-10-10 DIAGNOSIS — E852 Heredofamilial amyloidosis, unspecified: Secondary | ICD-10-CM | POA: Diagnosis not present

## 2023-10-10 DIAGNOSIS — G5603 Carpal tunnel syndrome, bilateral upper limbs: Secondary | ICD-10-CM | POA: Diagnosis not present

## 2023-10-10 DIAGNOSIS — Z8673 Personal history of transient ischemic attack (TIA), and cerebral infarction without residual deficits: Secondary | ICD-10-CM | POA: Diagnosis not present

## 2023-10-10 DIAGNOSIS — I43 Cardiomyopathy in diseases classified elsewhere: Secondary | ICD-10-CM | POA: Diagnosis not present

## 2023-10-10 DIAGNOSIS — I5032 Chronic diastolic (congestive) heart failure: Secondary | ICD-10-CM | POA: Diagnosis not present

## 2023-10-10 DIAGNOSIS — E854 Organ-limited amyloidosis: Secondary | ICD-10-CM | POA: Insufficient documentation

## 2023-10-10 DIAGNOSIS — I340 Nonrheumatic mitral (valve) insufficiency: Secondary | ICD-10-CM | POA: Insufficient documentation

## 2023-10-10 MED ORDER — VUTRISIRAN SODIUM 25 MG/0.5ML ~~LOC~~ SOSY
25.0000 mg | PREFILLED_SYRINGE | Freq: Once | SUBCUTANEOUS | Status: AC
Start: 2023-10-10 — End: 2023-10-10
  Administered 2023-10-10: 25 mg via SUBCUTANEOUS

## 2023-10-10 NOTE — Progress Notes (Signed)
 PCP: Dr. Nehemiah Settle HF Cardiology: Dr. Shirlee Latch  HPI:  78 y.o. with history of chronic diastolic CHF, mitral regurgitation, CVA, TTR amyloid, and CKD .    Patient was initially admitted with diastolic CHF in 01/2016. Echo showed EF 60-65% with grade 3 diastolic dysfunction and moderate MR.  She had cath showing no coronary disease. She was admitted in 01/2017 with left cerebellar CVA and LINQ monitor was placed.  TEE this admission showed moderate-severe MR.  She was admitted again in 05/2017 with acute on chronic diastolic CHF. Echo this admission showed EF 50-55%, severe LVH, restrictive diastolic dysfunction, severe MR, moderate-severe TR.  She was referred to Dr. Cornelius Moras who saw her for mitral valve evaluation.  Upon review of studies at that time, regurgitation thought to be more in the moderate range and likely functional.    Patient had PYP amyloid scan in 07/2017, this was suggestive of transthyretin amyloidosis.  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    Admission for A/C diastolic HF 2/6-2/05/2018. Required IV diuresis, then transitioned to 80 mg PO Lasix BID. Metoprolol was DC'd due to low blood pressures. Cardiac MRI strongly suggestive of amyloidosis, moderate to severe MR (moderate by regurgitant fraction 41% but severe visually). Echo repeated: EF 40-45%, moderate to severe LVH, severe MR, severe dilation of LA and RA, moderate TR, PASP 49.   She had Mitraclip x 2 placed in 11/2017.  Repeat echo post-mitraclip showed EF 55-60%, moderate LVH, mean gradient 3 mmHg across the MV with trace-mild MR.    Cardiomems was placed 12/2017.     Echo 01/2019 showed EF 55% with moderate LVH, s/p Mitraclip with mean gradient 3 mmHg, mild MR, normal RV, PASP 41 mmHg.    Echo in 06/2020 showed EF 50-55%, moderate LVH, normal RV, PASP 44 mmHg, s/p Mitraclip with mean gradient 5 mmHg with mild MR.   Follow up 09/2021, no significant neuropathic symptoms in feet on Amvuttra. Echo 09/2021  showed EF 55-60%, mild LVH, mild to moderate MR post Mitraclip.   S/p L hip arthroplasty 05/2023.    Today she returns to HF clinic for administration of Amvuttra (vutrisiran). Overall feeling well today. No contraindications to injection today. Injection administered in right arm.    Assessment/Plan: 1. Cardiac amyloidosis: Hereditary TTR amyloidosis.  She has peripheral neuropathy as well as bilateral carpal tunnel syndrome. Normal EF on 05/2017 echo with severe LVH and speckled myocardium consistent with cardiac amyloidosis.  SPEP was negative. 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 Cardiac MRI also strongly suggestive of cardiac amyloidosis.   Echo 09/2021 showed EF 55-60%.  - She is now on tafamidis, no side effects.  Receives from [employer]. - Amvuttra (vutrisiran) injection administered in clinic today. Patient tolerated injection well. Provided patient counseling on Amvuttra. Most common side effects are injection site reactions, arthralgias and dyspnea. Patient is aware to return to clinic every 3 months for repeat injection. Amvuttra will be obtained from [facility] and couriered to clinic for injection.  - Continue vitamin A supplement 10,000 IU daily. Amvuttra decreases serum vitamin A levels. 2. 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   - Continue Amvuttra as above   Follow up 3 months for repeat Amvuttra injection.    Karle Plumber, PharmD, BCPS, BCCP, CPP [facility] Failure Clinic Pharmacist [phone]

## 2023-10-10 NOTE — Patient Instructions (Signed)
 It was a pleasure seeing you today!  MEDICATIONS: -No medication changes today -Call if you have questions about your medications.   NEXT APPOINTMENT: Return to clinic in 3 months for repeat Amvuttra injection.  In general, to take care of your heart failure: -Limit your fluid intake to 2 Liters (half-gallon) per day.   -Limit your salt intake to ideally 2-3 grams (2000-3000 mg) per day. -Weigh yourself daily and record, and bring that "weight diary" to your next appointment.  (Weight gain of 2-3 pounds in 1 day typically means fluid weight.) -The medications for your heart are to help your heart and help you live longer.   -Please contact us before stopping any of your heart medications.  Call the clinic at [phone] with questions or to reschedule future appointments.

## 2023-10-11 ENCOUNTER — Other Ambulatory Visit (HOSPITAL_COMMUNITY): Payer: Self-pay

## 2023-10-12 DIAGNOSIS — E8582 Wild-type transthyretin-related (ATTR) amyloidosis: Secondary | ICD-10-CM | POA: Diagnosis not present

## 2023-10-12 DIAGNOSIS — I272 Pulmonary hypertension, unspecified: Secondary | ICD-10-CM | POA: Diagnosis not present

## 2023-10-12 DIAGNOSIS — N1832 Chronic kidney disease, stage 3b: Secondary | ICD-10-CM | POA: Diagnosis not present

## 2023-10-12 DIAGNOSIS — I10 Essential (primary) hypertension: Secondary | ICD-10-CM | POA: Diagnosis not present

## 2023-10-12 DIAGNOSIS — I5042 Chronic combined systolic (congestive) and diastolic (congestive) heart failure: Secondary | ICD-10-CM | POA: Diagnosis not present

## 2023-10-12 DIAGNOSIS — R112 Nausea with vomiting, unspecified: Secondary | ICD-10-CM | POA: Diagnosis not present

## 2023-10-17 ENCOUNTER — Other Ambulatory Visit: Payer: Self-pay

## 2023-10-17 NOTE — Progress Notes (Signed)
 Specialty Pharmacy Refill Coordination Note  Katie [name] is a 78 y.o. female, patients daughter [name] was  contacted today regarding refills of specialty medication(s) Tafamidis [name]   Patient requested Delivery   Delivery date: 10/26/23   Verified address: [address]   [place] [id number]   Medication will be filled on 03/25/2.

## 2023-10-21 ENCOUNTER — Other Ambulatory Visit: Payer: Self-pay | Admitting: Neurology

## 2023-10-21 ENCOUNTER — Other Ambulatory Visit: Payer: Self-pay

## 2023-10-25 ENCOUNTER — Other Ambulatory Visit (HOSPITAL_COMMUNITY): Payer: Self-pay

## 2023-10-27 ENCOUNTER — Encounter (HOSPITAL_COMMUNITY): Payer: Self-pay | Admitting: Cardiology

## 2023-10-27 ENCOUNTER — Ambulatory Visit (HOSPITAL_COMMUNITY)
Admission: RE | Admit: 2023-10-27 | Discharge: 2023-10-27 | Disposition: A | Discharge status: Home / Self Care | Payer: 59 | Source: Ambulatory Visit | Attending: Cardiology | Admitting: Cardiology

## 2023-10-27 VITALS — BP 110/70 | HR 69 | Wt 148.0 lb

## 2023-10-27 DIAGNOSIS — E854 Organ-limited amyloidosis: Secondary | ICD-10-CM | POA: Diagnosis not present

## 2023-10-27 DIAGNOSIS — I43 Cardiomyopathy in diseases classified elsewhere: Secondary | ICD-10-CM | POA: Insufficient documentation

## 2023-10-27 DIAGNOSIS — R9431 Abnormal electrocardiogram [ECG] [EKG]: Secondary | ICD-10-CM | POA: Diagnosis not present

## 2023-10-27 DIAGNOSIS — I130 Hypertensive heart and chronic kidney disease with heart failure and stage 1 through stage 4 chronic kidney disease, or unspecified chronic kidney disease: Secondary | ICD-10-CM | POA: Insufficient documentation

## 2023-10-27 DIAGNOSIS — G629 Polyneuropathy, unspecified: Secondary | ICD-10-CM | POA: Insufficient documentation

## 2023-10-27 DIAGNOSIS — Z8673 Personal history of transient ischemic attack (TIA), and cerebral infarction without residual deficits: Secondary | ICD-10-CM | POA: Insufficient documentation

## 2023-10-27 DIAGNOSIS — Z7982 Long term (current) use of aspirin: Secondary | ICD-10-CM | POA: Diagnosis not present

## 2023-10-27 DIAGNOSIS — N183 Chronic kidney disease, stage 3 unspecified: Secondary | ICD-10-CM | POA: Insufficient documentation

## 2023-10-27 DIAGNOSIS — Z7902 Long term (current) use of antithrombotics/antiplatelets: Secondary | ICD-10-CM | POA: Insufficient documentation

## 2023-10-27 DIAGNOSIS — Z96642 Presence of left artificial hip joint: Secondary | ICD-10-CM | POA: Insufficient documentation

## 2023-10-27 DIAGNOSIS — I5032 Chronic diastolic (congestive) heart failure: Secondary | ICD-10-CM | POA: Diagnosis not present

## 2023-10-27 DIAGNOSIS — E785 Hyperlipidemia, unspecified: Secondary | ICD-10-CM | POA: Diagnosis not present

## 2023-10-27 DIAGNOSIS — T8203XA Leakage of heart valve prosthesis, initial encounter: Secondary | ICD-10-CM | POA: Insufficient documentation

## 2023-10-27 DIAGNOSIS — E039 Hypothyroidism, unspecified: Secondary | ICD-10-CM | POA: Insufficient documentation

## 2023-10-27 DIAGNOSIS — E049 Nontoxic goiter, unspecified: Secondary | ICD-10-CM | POA: Insufficient documentation

## 2023-10-27 LAB — BASIC METABOLIC PANEL WITH GFR
Anion gap: 10 (ref 5–15)
BUN: 20 mg/dL (ref 8–23)
CO2: 25 mmol/L (ref 22–32)
Calcium: 9.4 mg/dL (ref 8.9–10.3)
Chloride: 104 mmol/L (ref 98–111)
Creatinine, Ser: 2.29 mg/dL — ABNORMAL HIGH (ref 0.44–1.00)
GFR, Estimated: 21 mL/min — ABNORMAL LOW (ref >60)
Glucose, Bld: 95 mg/dL (ref 70–99)
Potassium: 3.9 mmol/L (ref 3.5–5.1)
Sodium: 139 mmol/L (ref 135–145)

## 2023-10-27 LAB — BRAIN NATRIURETIC PEPTIDE: B Natriuretic Peptide: 209.1 pg/mL — ABNORMAL HIGH (ref 0.0–100.0)

## 2023-10-27 NOTE — Patient Instructions (Signed)
 There has been no changes to your medications.  Labs done today, your results will be available in MyChart, we will contact you for abnormal readings.  Your physician has requested that you have an echocardiogram. Echocardiography is a painless test that uses sound waves to create images of your heart. It provides your doctor with information about the size and shape of your heart and how well your heart's chambers and valves are working. This procedure takes approximately one hour. There are no restrictions for this procedure. Please do NOT wear cologne, perfume, aftershave, or lotions (deodorant is allowed). Please arrive 15 minutes prior to your appointment time.  Please note: We ask at that you not bring children with you during ultrasound (echo/ vascular) testing. Due to room size and safety concerns, children are not allowed in the ultrasound rooms during exams. Our front office staff cannot provide observation of children in our lobby area while testing is being conducted. An adult accompanying a patient to their appointment will only be allowed in the ultrasound room at the discretion of the ultrasound technician under special circumstances. We apologize for any inconvenience.  Your physician recommends that you schedule a follow-up appointment in: 4 months.  If you have any questions or concerns before your next appointment please send [place] a message through [place] or call our office at [phone].    TO LEAVE A MESSAGE FOR THE NURSE SELECT OPTION 2, PLEASE LEAVE A MESSAGE INCLUDING: YOUR NAME DATE OF BIRTH CALL BACK NUMBER REASON FOR CALL**this is important as we prioritize the call backs  YOU WILL RECEIVE A CALL BACK THE SAME DAY AS LONG AS YOU CALL BEFORE 4:00 PM  At the [facility], you and your health needs are our priority. As part of our continuing mission to provide you with exceptional heart care, we have created designated Provider Care Teams. These Care  Teams include your primary Cardiologist (physician) and Advanced Practice Providers (APPs- Physician Assistants and Nurse Practitioners) who all work together to provide you with the care you need, when you need it.   You may see any of the following providers on your designated Care Team at your next follow up: Dr Arvilla Meres Dr Marca Ancona Dr. Dorthula Nettles Dr. Clearnce Hasten Amy Filbert Schilder, NP Robbie Lis, [place] [facility] [place], [place] Brynda Peon, NP [place] Lee, NP Clarisa Kindred, NP Karle Plumber, PharmD Enos Fling, PharmD   Please be sure to bring in all your medications bottles to every appointment.    Thank you for choosing [facility]

## 2023-10-28 ENCOUNTER — Ambulatory Visit
Admission: RE | Admit: 2023-10-28 | Discharge: 2023-10-28 | Disposition: A | Discharge status: Home / Self Care | Source: Ambulatory Visit | Attending: Neurology | Admitting: Neurology

## 2023-10-28 DIAGNOSIS — G309 Alzheimer's disease, unspecified: Secondary | ICD-10-CM | POA: Diagnosis not present

## 2023-10-28 DIAGNOSIS — R413 Other amnesia: Secondary | ICD-10-CM | POA: Diagnosis not present

## 2023-10-28 MED ORDER — GADOPICLENOL 0.5 MMOL/ML IV SOLN
6.0000 mL | Freq: Once | INTRAVENOUS | Status: AC | PRN
  Administered 2023-10-28: 6 mL via INTRAVENOUS

## 2023-10-28 NOTE — Progress Notes (Signed)
 PCP: Dr. Nehemiah Settle HF Cardiology: Dr. Shirlee Latch  Chief complaint: CHF  78 y.o. with history of chronic diastolic CHF, mitral regurgitation, CVA, and CKD returns for followup of CHF.   Patient was initially admitted with diastolic CHF in 6/17. Echo showed EF 60-65% with grade 3 diastolic dysfunction and moderate MR.  She had cath showing no coronary disease. 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  TEE this admission showed moderate-severe MR.  She was admitted again in 10/18 with acute on chronic diastolic CHF. Echo this admission showed EF 50-55%, severe LVH, restrictive diastolic dysfunction, severe MR, moderate-severe TR.  She was referred to Dr. Cornelius Moras who saw her for mitral valve evaluation.  Upon review of studies at that time, regurgitation thought to be more in the moderate range and likely functional.   Patient had PYP amyloid scan in 12/18, this was suggestive of transthyretin amyloidosis.  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   Admission for A/C diastolic HF 2/6-2/05/2018. Required IV diuresis, then transitioned to 80 mg PO lasix BID. Metop was DC'd due to low blood pressures. Cardiac MRI strongly suggestive of amyloidosis, moderate to severe MR (moderate by regurgitant fraction 41% but severe visually). Echo repeated: EF 40-45%, moderate to severe LVH, severe MR, severe dilation of LA and RA, moderate TR, PASP 49.  She had Mitraclip x 2 placed in 5/19.  Repeat echo post-mitraclip showed EF 55-60%, moderate LVH, mean gradient 3 mmHg across the MV with trace-mild MR.   Cardiomems was placed 6/19.    Echo 6/20 showed EF 55% with moderate LVH, s/p Mitraclip with mean gradient 3 mmHg, mild MR, normal RV, PASP 41 mmHg.   Echo in 11/21 showed EF 50-55%, moderate LVH, normal RV, PASP 44 mmHg, s/p Mitraclip with mean gradient 5 mmHg with mild MR.   Echo 2/23 showed EF 55-60%, mild LVH, mild to moderate MR post Mitraclip.  Echo in 3/24  with EF 55%, mild LVH, normal RV, PASP 41 mmHg, s/p Mitraclip with mild-moderate MR (mean gradient 3 mmHg).   S/p L hip arthroplasty 10/24.  Today she returns for HF/amyloidosis follow up. No dyspnea with ADLs, walking to mailbox.  No problems with stairs.  No lightheadedness.  No chest pain.  No orthopnea/PND.  Overall, feels like she is doing ok.  Weight down 8 lbs.  Taking all meds.   Cardiomems PADP 14 (goal 19).   Labs (11/18): K 3.9, creatinine 1.43, SPEP negative Labs (2/19): K 3.8, creatinine 1.40, hgb 13.4 Labs (5/19): K 4.1, creatinine 1.4, hgb 10.8 Labs (6/19): K 4.2, creatinine 1.4, hgb 12.4 Labs (7/19): K 4, creatinine 1.38 Labs (1/20): K 3.6, creatinine 1.36 Labs (3/20): K 3.6, creatinine 1.31, LDL 107 Labs (7/20): creatinine 1.48 Labs (10/20): LDL 87 Labs (12/21): LDL 127, BNP 485 Labs (1/22): K 4.6, creatinine 1.65 Labs (6/22): K 4.1, creatinine 1.67, LDL 142 Labs (11/22): LDL 147 (was not taking Repatha), K 4.2, creatinine 1.64 Labs (2/23): K 4.0, creatinine 1.68, LDL 110, HDL 55 Labs (6/23): K 4, creatinine 1.71, LDL 103 Labs (9/23): K 4.0, creatinine 1.65 Labs (12/23): BNP 133, K 3.8, creatinine 1.71 Labs (3/24): LDL 126, BNP 151, K 4, creatinine 1.86 Labs (10/24): K 3.4, creatinine 1.97 Labs (12/24): LDL 64 Labs (2/25): K 4.2, creatinine 1.9  PMH: 1. HTN 2. Hyperlipidemia 3. 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  LINQ monitor placed.  4. Chronic diastolic CHF: She has Cardiomems.  - LHC (6/17): Normal  coronaries.  - Echo (10/18): EF 50-55%, severe LVH with speckled myocardium concerning for amyloidosis, restrictive diastolic function with septal flattening, severe MR, moderate-severe TR, severe biatrial enlargement, PASP 54 mmHg.  - PYP amyloid scan: Grade 3, suggestive of transthyretin amyloidosis.  - Echo (09/2017): EF 40-45%, moderate to severe LVH, severe MR, severe dilation of LA and RA, moderate TR, PASP 49 - Cardiac MRI (02/19): Moderate LVH, EF 45% with  diffuse hypokinesis, mildly dilated RV with mildly decreased systolic function, mod to severe MR (moderate by 41% regurgitant fraction, severe visually), severe biatrial enlargement, LGE pattern strongly suggestive of cardiac amyloidosis - Genetic testing showed genetic TTR amyloidosis from [id number].   - Echo (5/19): EF 55-60%, moderate LVH c/w amyloidosis, Mitraclip x 2 with mean MV gradient 3 mmHg, trace-mild MR, moderate TR, PASP 62 mmHg.  - Echo (6/19): EF 55-60%, moderate LVH with speckled myocardium, trivial MR s/p Mitraclip with mean gradient 2 mmHg, PASP 61.  - Echo (6/20): EF 55% with moderate LVH, s/p Mitraclip with mean gradient 3 mmHg, mild MR, normal RV, PASP 41 mmHg.  - Echo (11/21): EF 50-55%, moderate LVH, normal RV, PASP 44 mmHg, s/p Mitraclip with mean gradient 5 mmHg with mild MR. - Echo (2/23): EF 55-60%, mild LVH, mild to moderate MR post Mitraclip. - Echo (3/24):  EF 55%, mild LVH, normal RV, PASP 41 mmHg, s/p Mitraclip with mild-moderate MR (mean gradient 3 mmHg).  5. Mitral regurgitation: Think mixed functional/degenerative - TEE (7/18): EF 45-50%, moderate-severe MR, mild-moderate TR.  - Echo (10/18) read as severe MR.  - 2/19 echo with severe MR.  - 2/19 cardiac MRI with moderate to severe MR.  - Mitraclip x 2 in 5/19.   - Echo 5/19 post-Mitraclip with mean MV gradient 3 mmHg, trace-mild MR.  - Echo 6/20 post-Mitraclip with mean MV gradient 3 mmHg, mild MR.  - Echo 11/21 post-Mitraclip with mean MV gradient 5 mmHg, mild MR - Echo 2/23 EF 55-60%, mild LVH, mild to moderate MR post Mitraclip. - Echo 3/24 s/p Mitraclip with mild-moderate MR and mean gradient 3 mmHg.  6. CKD stage 3 7. Hypothyroidism 8. H/o renal cell CA s/p nephrectomy.  9. Bilateral carpal tunnel releases.  10. ABIs (6/20): Normal 11. OA: s/p left hip arthroplasty 10/24 12. Dementia: Mild.   Social History   Socioeconomic History   Marital status: Divorced    Spouse name: Not on file   Number  of children: 5   Years of education: 12   Highest education level: Not on file  Occupational History   Occupation: retired    Associate Professor: [employer]  Tobacco Use   Smoking status: Never   Smokeless tobacco: Never  Vaping Use   Vaping status: Never Used  Substance and Sexual Activity   Alcohol use: No   Drug use: No   Sexual activity: Not Currently  Other Topics Concern   Not on file  Social History Narrative   Lives alone in a one story home.  Her son stays with her some.  Has 5 children, 10 grandchildren and 10 great grandchildren.  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  Education: high school.        07/14/18- list food insecurity but is aware of food pantries and gets $15 in food stamps, reports issues with medical bills but has insurance and gets extra help through medicaid for premiums- encouraged pt to call billing to set up reasonable payment plan so it doesn't go to collections.  Left Handed   Social Drivers of Health   Financial Resource Strain: Low Risk  (09/04/2019)   Overall Financial Resource Strain (CARDIA)    Difficulty of Paying Living Expenses: Not hard at all  Food Insecurity: No Food Insecurity (05/16/2023)   Hunger Vital Sign    Worried About Running Out of Food in the Last Year: Never true    Ran Out of Food in the Last Year: Never true  Transportation Needs: No Transportation Needs (05/16/2023)   PRAPARE - Administrator, Civil Service (Medical): No    Lack of Transportation (Non-Medical): No  Physical Activity: Not on file  Stress: Not on file  Social Connections: Not on file  Intimate Partner Violence: Not At Risk (05/16/2023)   Humiliation, Afraid, Rape, and Kick questionnaire    Fear of Current or Ex-Partner: No    Emotionally Abused: No    Physically Abused: No    Sexually Abused: No   Family History  Problem Relation Age of Onset   Stroke Brother    Diabetes Mellitus II Brother    Diabetes Mellitus II Sister    Breast cancer  Sister    Colon cancer Sister    ROS: All systems reviewed and negative except as per HPI.   Current Outpatient Medications  Medication Sig Dispense Refill   acetaminophen (TYLENOL) 500 MG tablet Take 1,000 mg by mouth daily.     aspirin 81 MG tablet Take 1 tablet (81 mg total) daily by mouth.     Bempedoic Acid-Ezetimibe (NEXLIZET) 180-10 MG TABS Take 1 tablet by mouth daily. 90 tablet 3   carvedilol (COREG) 6.25 MG tablet TAKE 1 TABLET(6.25 MG) BY MOUTH TWICE DAILY WITH A MEAL 180 tablet 3   clopidogrel (PLAVIX) 75 MG tablet Take 1 tablet (75 mg total) by mouth daily. 30 tablet 1   Evolocumab (REPATHA SURECLICK) 140 MG/ML SOAJ Inject 140 mg into the skin every 14 (fourteen) days. 6 mL 3   feeding supplement, ENSURE ENLIVE, (ENSURE ENLIVE) LIQD Take 237 mLs by mouth 2 (two) times daily between meals. 237 mL 0   folic acid (FOLVITE) 1 MG tablet Take 1 tablet (1 mg total) by mouth daily. 100 tablet 3   furosemide (LASIX) 20 MG tablet takes 2 tablets in the morning then take 1 tablet by mouth every evening. 90 tablet 6   gabapentin (NEURONTIN) 300 MG capsule Take 300 mg by mouth 2 (two) times daily.     Hypromellose (ARTIFICIAL TEARS OP) Place 1 drop into both eyes 2 (two) times daily.     JARDIANCE 10 MG TABS tablet TAKE 1 TABLET(10 MG) BY MOUTH DAILY BEFORE BREAKFAST 90 tablet 1   L-Methylfolate-B12-B6-B2 (CEREFOLIN) 12-31-48-5 MG TABS Take 1 tablet by mouth daily. 90 tablet 3   levothyroxine (SYNTHROID, LEVOTHROID) 25 MCG tablet Take 25 mcg by mouth daily before breakfast.     memantine (NAMENDA) 10 MG tablet TAKE 1 TABLET(10 MG) BY MOUTH TWICE DAILY 60 tablet 3   pantoprazole (PROTONIX) 40 MG tablet TAKE 1 TABLET(40 MG) BY MOUTH DAILY 90 tablet 3   potassium chloride SA (KLOR-CON) 20 MEQ tablet Take 1 tablet (20 mEq total) by mouth daily. 60 tablet 0   Tafamidis (VYNDAMAX) 61 MG CAPS Take 1 capsule (61 mg total) by mouth daily. 30 capsule 11   Vitamin A 3 MG (10000 UT) TABS Take 3 mg by  mouth daily.     vutrisiran sodium (AMVUTTRA) 25 MG/0.5ML syringe Inject 0.5 mLs (25 mg  total) into the skin every 3 (three) months. 0.5 mL 3   No current facility-administered medications for this encounter.   BP 110/70   Pulse 69   Wt 67.1 kg (148 lb)   LMP 02/12/2013   SpO2 99%   BMI 24.63 kg/m   Wt Readings from Last 3 Encounters:  10/27/23 67.1 kg (148 lb)  09/30/23 70 kg (154 lb 5.2 oz)  09/20/23 70.5 kg (155 lb 6.4 oz)   General: NAD Neck: No JVD. +Goiter.   Lungs: Clear to auscultation bilaterally with normal respiratory effort. CV: Nondisplaced PMI.  Heart regular S1/S2, no S3/S4, no murmur.  No peripheral edema.  No carotid bruit.  Normal pedal pulses.  Abdomen: Soft, nontender, no hepatosplenomegaly, no distention.  Skin: Intact without lesions or rashes.  Neurologic: Alert and oriented x 3.  Psych: Normal affect. Extremities: No clubbing or cyanosis.  HEENT: Normal.   Assessment/Plan: 1. Chronic diastolic CHF: Normal EF on 10/18 echo with severe LVH and speckled myocardium consistent with cardiac amyloidosis.  SPEP was negative. 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 Cardiac MRI also strongly suggestive of cardiac amyloidosis. Echo 3/24 showed EF 55%, mild LVH. NYHA class II symptoms.  She is not volume overloaded by Cardiomems or exam.  - I will arrange for echo.   - Continue Coreg 6.25 mg bid. - Continue Lasix 40 qam/20 qpm. Check BMET/BNP.  - Continue Jardiance 10 mg daily.  2. Mitral regurgitation: Mitral regurgitation seems to fluctuate somewhat based on loading conditions.  Most recently, echo in 2/19 suggested severe MR and cardiac MRI suggested moderate to severe MR (moderate by 41% regurgitant fraction but visually severe). Suspect that the etiology is mixed - functional as well as degenerative with thickening and restriction of posterior leaflet. She  is now s/p Mitraclip procedure in 5/19. Echo in 11/21 showed mean gradient 5 mmHg across the MV with Mitraclip in place, MR is mild. Echo 3/24 showed mild to moderate MR with mean gradient 3 mmHg, stable. - I will arrange for echo.  3. CKD: Stage III.  Continue SGLT2i. BMET today.  4. CVA: 7/18. Has LINQ monitor, no atrial fibrillation detected so far.  - Continue ASA 81.   - Continue Plavix. 5. 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  Neuropathic pain has improved with Amvuttra.  - Continue Amvuttra.  6. Cardiac amyloidosis: Hereditary TTR amyloidosis.  She has peripheral neuropathy as well as bilateral carpal tunnel syndrome.   - She is now on tafamidis and Amvuttra, continue.   7. Hyperlipidemia: Goal LDL < 70 with CVA.  Cannot take statins due to rhabdomyolysis with Crestor (interaction with tafamidis). Goal LDL < 55.  - Continue Repatha, Zetia and Nexlizet. .  - She is followed by [facility]id Clinic 8. Osteoarthritis: s/p left hip arthroplasty.  9. Goiter: Goiter noted on neck exam.  She has hypothyroidism and is on Levoxyl.   Follow up 4 months with APP  I spent 31 minutes reviewing records, interviewing/examining patient, and managing orders.     Katie [name],  10/28/2023

## 2023-11-03 ENCOUNTER — Telehealth (HOSPITAL_COMMUNITY): Payer: Self-pay | Admitting: *Deleted

## 2023-11-03 DIAGNOSIS — I5032 Chronic diastolic (congestive) heart failure: Secondary | ICD-10-CM

## 2023-11-03 MED ORDER — FUROSEMIDE 20 MG PO TABS: 40.0000 mg | ORAL_TABLET | Freq: Every day | ORAL | Status: DC

## 2023-11-03 NOTE — Telephone Encounter (Signed)
 Result Note Pt aware, agreeable, and verbalized understanding

## 2023-11-03 NOTE — Progress Notes (Signed)
 Can inform the patient that MRI scan of the brain shows no new or worrisome finding.  Old strokes are noted in the back on the left and towards the top.

## 2023-11-03 NOTE — Telephone Encounter (Signed)
-----   Message from Marca Ancona sent at 10/27/2023  2:23 PM EDT ----- I think we can decrease Lasix back to 40 mg daily  (stop evening dose).  Follow Cardiomems to make sure we don't need to increase dose again.

## 2023-11-04 ENCOUNTER — Telehealth: Payer: Self-pay

## 2023-11-04 NOTE — Telephone Encounter (Signed)
-----   Message from Katie [name] sent at 11/03/2023  6:00 PM EDT ----- Can inform the patient that MRI scan of the brain shows no new or worrisome finding.  Old strokes are noted in the back on the left and towards the top.

## 2023-11-15 ENCOUNTER — Other Ambulatory Visit: Payer: Self-pay

## 2023-11-17 ENCOUNTER — Other Ambulatory Visit: Payer: Self-pay

## 2023-11-18 ENCOUNTER — Ambulatory Visit (HOSPITAL_COMMUNITY)
Admission: RE | Admit: 2023-11-18 | Discharge: 2023-11-18 | Disposition: A | Discharge status: Home / Self Care | Source: Ambulatory Visit | Attending: Cardiology | Admitting: Cardiology

## 2023-11-18 DIAGNOSIS — I5032 Chronic diastolic (congestive) heart failure: Secondary | ICD-10-CM | POA: Diagnosis not present

## 2023-11-18 DIAGNOSIS — I110 Hypertensive heart disease with heart failure: Secondary | ICD-10-CM | POA: Diagnosis not present

## 2023-11-18 DIAGNOSIS — I081 Rheumatic disorders of both mitral and tricuspid valves: Secondary | ICD-10-CM | POA: Insufficient documentation

## 2023-11-18 DIAGNOSIS — I272 Pulmonary hypertension, unspecified: Secondary | ICD-10-CM | POA: Diagnosis not present

## 2023-11-18 DIAGNOSIS — Z8673 Personal history of transient ischemic attack (TIA), and cerebral infarction without residual deficits: Secondary | ICD-10-CM | POA: Diagnosis not present

## 2023-11-18 LAB — ECHOCARDIOGRAM COMPLETE
AR max vel: 1.85 cm2
AV Area VTI: 1.85 cm2
AV Area mean vel: 1.83 cm2
AV Mean grad: 4 mmHg
AV Peak grad: 6.9 mmHg
Ao pk vel: 1.31 m/s
Area-P 1/2: 3.72 cm2
Est EF: 55
MV VTI: 1.51 cm2
S' Lateral: 3.6 cm

## 2023-11-21 ENCOUNTER — Other Ambulatory Visit (HOSPITAL_COMMUNITY): Payer: Self-pay

## 2023-11-21 ENCOUNTER — Other Ambulatory Visit: Payer: Self-pay

## 2023-11-21 NOTE — Progress Notes (Signed)
 Specialty Pharmacy Refill Coordination Note  Katie [name] is a 78 y.o. female contacted today regarding refills of specialty medication(s) Tafamidis  (Vyndamax )   Patient requested Delivery   Delivery date: 12/12/23   Verified address: [address]   [place] [address]   Medication will be filled on 05.09.25.

## 2023-11-22 ENCOUNTER — Encounter (HOSPITAL_COMMUNITY): Payer: Self-pay

## 2023-11-29 ENCOUNTER — Telehealth (HOSPITAL_COMMUNITY): Payer: Self-pay

## 2023-11-29 NOTE — Telephone Encounter (Addendum)
 Pt aware, agreeable, and verbalized understanding   ----- Message from Peder Bourdon sent at 11/20/2023  3:17 AM EDT ----- Similar to prior. Intact mitraclip with mild-mod MR. Normal EF.

## 2023-12-01 ENCOUNTER — Other Ambulatory Visit (HOSPITAL_COMMUNITY): Payer: Self-pay

## 2023-12-01 ENCOUNTER — Other Ambulatory Visit: Payer: Self-pay

## 2023-12-09 ENCOUNTER — Other Ambulatory Visit: Payer: Self-pay

## 2023-12-27 ENCOUNTER — Other Ambulatory Visit: Payer: Self-pay

## 2024-01-02 ENCOUNTER — Other Ambulatory Visit: Payer: Self-pay

## 2024-01-02 NOTE — Progress Notes (Signed)
 Specialty Pharmacy Refill Coordination Note  KAHLEN BOYDE is a 78 y.o. female contacted today regarding refills of specialty medication(s) Vutrisiran  Sodium (Amvuttra )   Patient requested Courier to Provider Office   Delivery date: 01/09/24   Verified address: [facility] [address]   Medication will be filled on 06.06.2, appointment is 6.11.25.   Vyndamax  will mail to patient home 6.11.25 for 6.12.25 delivery : [id number]

## 2024-01-02 NOTE — Progress Notes (Signed)
 Specialty Pharmacy Ongoing Clinical Assessment Note  Katie [name] is a 78 y.o. female who is being followed by the specialty pharmacy service for RxSp Cardiology   Patient's specialty medication(s) reviewed today: Tafamidis  (Vyndamax ); Vutrisiran  Sodium (Amvuttra )   Missed doses in the last 4 weeks: 0   Patient/Caregiver did not have any additional questions or concerns.   Therapeutic benefit summary: Patient is achieving benefit   Adverse events/side effects summary: No adverse events/side effects   Patient's therapy is appropriate to: Continue    Goals Addressed             This Visit's Progress    Stabilization of disease   On track    Patient is on track. Patient will maintain adherence         Follow up: 6 months  Avon Molock M Renelda Kilian Specialty Pharmacist

## 2024-01-06 ENCOUNTER — Other Ambulatory Visit: Payer: Self-pay

## 2024-01-10 NOTE — Progress Notes (Incomplete)
 ***In Progress***    Advanced Heart Failure Clinic Note  PCP: Dr. Joice Nares HF Cardiology: Dr. Mitzie Anda  HPI:  78 y.o. with history of chronic diastolic CHF, mitral regurgitation, CVA, and CKD returns for followup of CHF.    Patient was initially admitted with diastolic CHF in 01/2016. Echo showed EF 60-65% with grade 3 diastolic dysfunction and moderate MR.  She had cath showing no coronary disease. She was admitted in 01/2017 with left cerebellar CVA and LINQ monitor was placed.  TEE this admission showed moderate-severe MR.  She was admitted again in 10/18 with acute on chronic diastolic CHF. Echo this admission showed EF 50-55%, severe LVH, restrictive diastolic dysfunction, severe MR, moderate-severe TR.  She was referred to Dr. Alva Jewels who saw her for mitral valve evaluation.  Upon review of studies at that time, regurgitation thought to be more in the moderate range and likely functional.    Patient had PYP amyloid scan in 07/2017, this was suggestive of transthyretin amyloidosis.  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    Admission for A/C diastolic HF 2/6-2/05/2018. Required IV diuresis, then transitioned to 80 mg PO lasix  BID. Metop was DC'd due to low blood pressures. Cardiac MRI strongly suggestive of amyloidosis, moderate to severe MR (moderate by regurgitant fraction 41% but severe visually). Echo repeated: EF 40-45%, moderate to severe LVH, severe MR, severe dilation of LA and RA, moderate TR, PASP 49.   She had Mitraclip x 2 placed in 11/2017.  Repeat echo post-mitraclip showed EF 55-60%, moderate LVH, mean gradient 3 mmHg across the MV with trace-mild MR. Cardiomems was placed 12/2017.     Echo 01/2019 showed EF 55% with moderate LVH, s/p Mitraclip with mean gradient 3 mmHg, mild MR, normal RV, PASP 41 mmHg. Echo in 06/2020 showed EF 50-55%, moderate LVH, normal RV, PASP 44 mmHg, s/p Mitraclip with mean gradient 5 mmHg with mild MR.    Echo 09/2021 showed EF 55-60%,  mild LVH, mild to moderate MR post Mitraclip.  Echo in 10/2022 with EF 55%, mild LVH, normal RV, PASP 41 mmHg, s/p Mitraclip with mild-moderate MR (mean gradient 3 mmHg).    S/p L hip arthroplasty 05/2023.   She returned for HF/amyloidosis follow up with MD on 10/27/23. Overall, reported feeling okay. Her weight was down 8 lbs. ** Cardiomems PADP was 14 (goal 19). After this appointment, furosemide  was decreased to 40 mg daily.   Echo on 11/2023 showed EF 55%, moderate LVH, mild to moderate MR s/p Mitraclip (mean gradient 3 mmHg), normal RV, PASP 50 mmHg.***  Today he returns to HF clinic for administration of Amvuttra  (vutrisiran ). Overall feeling well today *** No contraindications to injection today *** Injection administered into *** arm.  Shortness of breath/dyspnea on exertion? {YES NO:22349}  Orthopnea/PND? {YES NO:22349} Edema? {YES NO:22349} Lightheadedness/dizziness? {YES NO:22349} Daily weights at home? {YES NO:22349} Blood pressure/heart rate monitoring at home? {YES [id number] Following low-sodium/fluid-restricted diet? {YES NO:22349}  HF Medications: *** Carvedilol  6.25 mg BID Jardiance  10 mg daily Furosemide  40 mg daily ***no recent fills*** Vyndamax  (Tafamidis ) 61 mg daily Amvuttra  (vutrisiran ) 25 mg every 3 months Other: Potassium chloride  20 mEq daily ***  Has the patient been experiencing any side effects to the medications prescribed?  {YES NO:22349}  Does the patient have any problems obtaining medications due to transportation or finances?   No, patient has [employer] of regimen: {excellent/good/fair/poor:19665} Understanding of indications: {excellent/good/fair/poor:19665} Potential of compliance: {excellent/good/fair/poor:19665} Patient understands to avoid NSAIDs. Patient  understands to avoid decongestants.    Pertinent Lab Values: (10/27/23) Serum creatinine 2.29 mg/dL, BUN 20 mg/dL, Potassium 3.9 mmol/L, Sodium 139 mmol/L,  BNP 209.1  Vital Signs: Weight: *** (last clinic weight: 148 lbs) Blood pressure: *** (last clinic BP: 110/70 mmHg)*** Heart rate: *** (last clinic HR: 69 bpm)***  Assessment/Plan: 1. Chronic diastolic CHF: Normal EF on 05/2017 echo with severe LVH and speckled myocardium consistent with cardiac amyloidosis.  SPEP was negative. 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 Cardiac MRI also strongly suggestive of cardiac amyloidosis. Echo 10/2202 showed EF 55%, mild LVH. NYHA class II symptoms.  She is not volume overloaded by Cardiomems or exam.  Echo on 11/18/23 with EF 55%, moderate LVH.  - Continue Coreg  6.25 mg twice daily - Continue Jardiance  10 mg daily.  - Continue Lasix  40 daily and potassium chloride  20 mEq daily. ***  2. Mitral regurgitation: Mitral regurgitation seems to fluctuate somewhat based on loading conditions.  Most recently, echo in 09/2017 suggested severe MR and cardiac MRI suggested moderate to severe MR (moderate by 41% regurgitant fraction but visually severe). Suspect that the etiology is mixed - functional as well as degenerative with thickening and restriction of posterior leaflet. She is now s/p Mitraclip procedure in 11/2017. Echo in 06/2020 showed mean gradient 5 mmHg across the MV with Mitraclip in place, MR is mild. Echo 10/2022 showed mild to moderate MR with mean gradient 3 mmHg, stable. - Most recent echo on 11/18/23 with mild to moderate MR, mean gradient 3 mmHg, stable  3. CKD: Stage III.  - Continue Jardiance  10 mg daily  4. CVA: 7/18. Has LINQ monitor, no atrial fibrillation detected so far.  - Continue aspirin  81 mg daily - Continue clopidogrel  75 mg daily  5. 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  Neuropathic pain has improved with Amvuttra .  - Continue Amvuttra  (vutrisiran ) 25 mg  every 3 months  6. Cardiac amyloidosis: Hereditary TTR amyloidosis.  She has peripheral neuropathy as well as bilateral carpal tunnel syndrome.   - Continue Vyndamax  (tafamadis) 61 mg daily - Continue Amvuttra  (vutrisiran ) 25 mg every 3 months   7. Hyperlipidemia: Goal LDL < 70 with CVA.  Cannot take statins due to rhabdomyolysis with Crestor  (interaction with tafamidis ). Goal LDL < 55.  - Continue Repatha  (evolocumab ) 140 mg every 2 weeks - Continue Nexlizet  (bempedoic acid -ezetimibe ) 180-10 mg daily  - She is followed by Lipid Clinic  8. Osteoarthritis: s/p left hip arthroplasty.   9. Goiter: Goiter noted on neck exam.  She has hypothyroidism and is on levothyroxine .  Basic disease state pathophysiology, medication indication, mechanism and side effects reviewed at length with patient and he verbalized understanding  Follow up with APP in AHF Clinic on 02/13/24. ***  Volney Grumbles, PharmD PGY-1 Acute Care Pharmacy Resident  Luster Salters, PharmD, BCPS, [facility], CPP Heart Failure Clinic Pharmacist [phone]

## 2024-01-11 ENCOUNTER — Ambulatory Visit (HOSPITAL_COMMUNITY)
Admission: RE | Admit: 2024-01-11 | Discharge: 2024-01-11 | Disposition: A | Discharge status: Home / Self Care | Source: Ambulatory Visit | Attending: Cardiology | Admitting: Cardiology

## 2024-01-11 DIAGNOSIS — E038 Other specified hypothyroidism: Secondary | ICD-10-CM | POA: Diagnosis not present

## 2024-01-11 DIAGNOSIS — E785 Hyperlipidemia, unspecified: Secondary | ICD-10-CM | POA: Diagnosis not present

## 2024-01-11 DIAGNOSIS — Z79899 Other long term (current) drug therapy: Secondary | ICD-10-CM | POA: Diagnosis not present

## 2024-01-11 DIAGNOSIS — Z79890 Hormone replacement therapy (postmenopausal): Secondary | ICD-10-CM | POA: Insufficient documentation

## 2024-01-11 DIAGNOSIS — G63 Polyneuropathy in diseases classified elsewhere: Secondary | ICD-10-CM | POA: Insufficient documentation

## 2024-01-11 DIAGNOSIS — Z7984 Long term (current) use of oral hypoglycemic drugs: Secondary | ICD-10-CM | POA: Diagnosis not present

## 2024-01-11 DIAGNOSIS — I340 Nonrheumatic mitral (valve) insufficiency: Secondary | ICD-10-CM | POA: Insufficient documentation

## 2024-01-11 DIAGNOSIS — I5032 Chronic diastolic (congestive) heart failure: Secondary | ICD-10-CM | POA: Diagnosis not present

## 2024-01-11 DIAGNOSIS — E851 Neuropathic heredofamilial amyloidosis: Secondary | ICD-10-CM | POA: Insufficient documentation

## 2024-01-11 DIAGNOSIS — E048 Other specified nontoxic goiter: Secondary | ICD-10-CM | POA: Insufficient documentation

## 2024-01-11 DIAGNOSIS — Z96642 Presence of left artificial hip joint: Secondary | ICD-10-CM | POA: Insufficient documentation

## 2024-01-11 DIAGNOSIS — Z7902 Long term (current) use of antithrombotics/antiplatelets: Secondary | ICD-10-CM | POA: Diagnosis not present

## 2024-01-11 DIAGNOSIS — N183 Chronic kidney disease, stage 3 unspecified: Secondary | ICD-10-CM | POA: Insufficient documentation

## 2024-01-11 DIAGNOSIS — Z8673 Personal history of transient ischemic attack (TIA), and cerebral infarction without residual deficits: Secondary | ICD-10-CM | POA: Insufficient documentation

## 2024-01-11 DIAGNOSIS — G629 Polyneuropathy, unspecified: Secondary | ICD-10-CM | POA: Diagnosis not present

## 2024-01-11 MED ORDER — VUTRISIRAN SODIUM 25 MG/0.5ML ~~LOC~~ SOSY
25.0000 mg | PREFILLED_SYRINGE | Freq: Once | SUBCUTANEOUS | Status: AC
  Administered 2024-01-11: 25 mg via SUBCUTANEOUS

## 2024-01-11 NOTE — Patient Instructions (Signed)
 It was a pleasure seeing you today!  MEDICATIONS: -No medication changes today -Call if you have questions about your medications.   NEXT APPOINTMENT: Return to clinic in 3 months for repeat Amvuttra injection.  In general, to take care of your heart failure: -Limit your fluid intake to 2 Liters (half-gallon) per day.   -Limit your salt intake to ideally 2-3 grams (2000-3000 mg) per day. -Weigh yourself daily and record, and bring that "weight diary" to your next appointment.  (Weight gain of 2-3 pounds in 1 day typically means fluid weight.) -The medications for your heart are to help your heart and help you live longer.   -Please contact us before stopping any of your heart medications.  Call the clinic at [phone] with questions or to reschedule future appointments.

## 2024-01-11 NOTE — Progress Notes (Signed)
 [facility] Note  PCP: Dr. Joice Nares HF Cardiology: Dr. Mitzie Anda  HPI:  78 y.o. with history of chronic diastolic CHF, mitral regurgitation, CVA, and CKD returns for followup of CHF.    Patient was initially admitted with diastolic CHF in 01/2016. Echo showed EF 60-65% with grade 3 diastolic dysfunction and moderate MR.  She had cath showing no coronary disease. She was admitted in 01/2017 with left cerebellar CVA and LINQ monitor was placed.  TEE this admission showed moderate-severe MR.  She was admitted again in 05/2017 with acute on chronic diastolic CHF. Echo this admission showed EF 50-55%, severe LVH, restrictive diastolic dysfunction, severe MR, moderate-severe TR.  She was referred to Dr. Alva Jewels who saw her for mitral valve evaluation.  Upon review of studies at that time, regurgitation thought to be more in the moderate range and likely functional.    Patient had PYP amyloid scan in 07/2017, this was suggestive of transthyretin amyloidosis.  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    Admission for A/C diastolic HF 2/6-2/05/2018. Required IV diuresis, then transitioned to 80 mg PO Lasix  BID. Metop was DC'd due to low blood pressures. Cardiac MRI strongly suggestive of amyloidosis, moderate to severe MR (moderate by regurgitant fraction 41% but severe visually). Echo repeated: EF 40-45%, moderate to severe LVH, severe MR, severe dilation of LA and RA, moderate TR, PASP 49.   She had Mitraclip x 2 placed in 11/2017.  Repeat echo post-mitraclip showed EF 55-60%, moderate LVH, mean gradient 3 mmHg across the MV with trace-mild MR. Cardiomems was placed 12/2017.     Echo 01/2019 showed EF 55% with moderate LVH, s/p Mitraclip with mean gradient 3 mmHg, mild MR, normal RV, PASP 41 mmHg. Echo in 06/2020 showed EF 50-55%, moderate LVH, normal RV, PASP 44 mmHg, s/p Mitraclip with mean gradient 5 mmHg with mild MR.    Echo 09/2021 showed EF 55-60%, mild LVH, mild to  moderate MR post Mitraclip.  Echo in 10/2022 with EF 55%, mild LVH, normal RV, PASP 41 mmHg, s/p Mitraclip with mild-moderate MR (mean gradient 3 mmHg).    S/p L hip arthroplasty 05/2023.   She returned for HF/amyloidosis follow up with MD on 10/27/23. Overall, reported feeling okay. Her weight was down 8 lbs. Cardiomems PADP was 14 (goal 19). After this appointment, furosemide  was decreased to 40 mg daily.   Echo on 11/2023 showed EF 55%, moderate LVH, mild to moderate MR s/p Mitraclip (mean gradient 3 mmHg), normal RV, PASP 50 mmHg.  Today she returns to HF clinic for administration of Amvuttra  (vutrisiran ). Overall feeling well today. No contraindications to injection today. Injection administered into L arm.   HF Medications:  Carvedilol  6.25 mg BID Jardiance  10 mg daily Furosemide  40 mg daily Vyndamax  (tafamidis ) 61 mg daily Amvuttra  (vutrisiran ) 25 mg every 3 months Other: Potassium chloride  20 mEq daily     Pertinent Lab Values: (10/27/23) Serum creatinine 2.29 mg/dL, BUN 20 mg/dL, Potassium 3.9 mmol/L, Sodium 139 mmol/L, BNP 209.1  Assessment/Plan: 1. Chronic diastolic CHF: Normal EF on 05/2017 echo with severe LVH and speckled myocardium consistent with cardiac amyloidosis.  SPEP was negative. 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 Cardiac MRI also strongly suggestive of cardiac amyloidosis. Echo 10/2202 showed EF 55%, mild LVH.   -NYHA class II symptoms.   - Continue Lasix  40 daily and potassium chloride  20 mEq daily.  - Echo on  11/18/23 with EF 55%, moderate LVH.  - Continue carvedilol  6.25 mg twice daily - Continue Jardiance  10 mg daily.   2. Mitral regurgitation: Mitral regurgitation seems to fluctuate somewhat based on loading conditions.  Most recently, echo in 09/2017 suggested severe MR and cardiac MRI suggested moderate to severe MR (moderate by 41% regurgitant  fraction but visually severe). Suspect that the etiology is mixed - functional as well as degenerative with thickening and restriction of posterior leaflet. She is now s/p Mitraclip procedure in 11/2017. Echo in 06/2020 showed mean gradient 5 mmHg across the MV with Mitraclip in place, MR is mild. Echo 10/2022 showed mild to moderate MR with mean gradient 3 mmHg, stable. - Most recent echo on 11/18/23 with mild to moderate MR, mean gradient 3 mmHg, stable  3. CKD: Stage III.  - Continue Jardiance  10 mg daily  4. CVA: 01/2017. Has LINQ monitor, no atrial fibrillation detected so far.  - Continue aspirin  81 mg daily - Continue clopidogrel  75 mg daily  5. 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  Neuropathic pain has improved with Amvuttra .  - Continue Amvuttra  (vutrisiran ) 25 mg every 3 months  6. Cardiac amyloidosis: Hereditary TTR amyloidosis.  She has peripheral neuropathy as well as bilateral carpal tunnel syndrome.   - Continue Vyndamax  (tafamadis) 61 mg daily - Amvuttra  (vutrisiran ) injection administered in clinic today. Patient tolerated injection well. Provided patient counseling on Amvuttra . Most common side effects are injection site reactions, arthralgias and dyspnea. Patient is aware to return to clinic every 3 months for repeat injection. Amvuttra  will be obtained from [facility] and couriered to clinic for injection.  - Continue vitamin A supplement 10,000 IU daily. Amvuttra  decreases serum vitamin A levels  7. Hyperlipidemia: Goal LDL < 70 with CVA.  Cannot take statins due to rhabdomyolysis with Crestor  (interaction with tafamidis ). Goal LDL < 55.  - Continue Repatha  (evolocumab ) 140 mg every 2 weeks - Continue Nexlizet  (bempedoic acid -ezetimibe ) 180-10 mg daily  - She is followed by Lipid Clinic  8. Osteoarthritis: s/p left hip arthroplasty.   9. Goiter: Goiter noted on neck exam.  She has  hypothyroidism and is on levothyroxine .  Follow up with APP in AHF Clinic on 02/13/24. Repeat Amvuttra  injection in 3 months.   Luster Salters, PharmD, BCPS, BCCP, CPP Heart Failure Clinic Pharmacist [phone]

## 2024-01-25 DIAGNOSIS — I5032 Chronic diastolic (congestive) heart failure: Secondary | ICD-10-CM | POA: Diagnosis not present

## 2024-01-25 DIAGNOSIS — C641 Malignant neoplasm of right kidney, except renal pelvis: Secondary | ICD-10-CM | POA: Diagnosis not present

## 2024-01-25 DIAGNOSIS — I43 Cardiomyopathy in diseases classified elsewhere: Secondary | ICD-10-CM | POA: Diagnosis not present

## 2024-01-25 DIAGNOSIS — N184 Chronic kidney disease, stage 4 (severe): Secondary | ICD-10-CM | POA: Diagnosis not present

## 2024-01-25 DIAGNOSIS — E854 Organ-limited amyloidosis: Secondary | ICD-10-CM | POA: Diagnosis not present

## 2024-01-25 DIAGNOSIS — I340 Nonrheumatic mitral (valve) insufficiency: Secondary | ICD-10-CM | POA: Diagnosis not present

## 2024-01-25 DIAGNOSIS — I129 Hypertensive chronic kidney disease with stage 1 through stage 4 chronic kidney disease, or unspecified chronic kidney disease: Secondary | ICD-10-CM | POA: Diagnosis not present

## 2024-01-28 ENCOUNTER — Encounter (HOSPITAL_COMMUNITY): Payer: Self-pay | Admitting: Interventional Radiology

## 2024-02-07 ENCOUNTER — Other Ambulatory Visit (HOSPITAL_COMMUNITY): Payer: Self-pay | Admitting: Cardiology

## 2024-02-10 ENCOUNTER — Other Ambulatory Visit: Payer: Self-pay

## 2024-02-10 ENCOUNTER — Telehealth (HOSPITAL_COMMUNITY): Payer: Self-pay | Admitting: *Deleted

## 2024-02-10 NOTE — Telephone Encounter (Signed)
 Called to confirm/remind patient of their appointment at the [facility] on 02/13/24.      Appointment:              [] Confirmed             [x] Left mess              [] No answer/No voice mail             [] Phone not in service   Patient reminded to bring all medications and/or complete list.   Confirmed patient has transportation. Gave directions, instructed to utilize valet parking.

## 2024-02-10 NOTE — Progress Notes (Incomplete)
 PCP: Dr. Rexanne HF Cardiology: Dr. Rolan  Chief complaint: CHF  78 y.o. with history of chronic diastolic CHF, mitral regurgitation, CVA, and CKD returns for followup of CHF.   Patient was initially admitted with diastolic CHF in 6/17. Echo showed EF 60-65% with grade 3 diastolic dysfunction and moderate MR.  She had cath showing no coronary disease. 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  TEE this admission showed moderate-severe MR.  She was admitted again in 10/18 with acute on chronic diastolic CHF. Echo this admission showed EF 50-55%, severe LVH, restrictive diastolic dysfunction, severe MR, moderate-severe TR.  She was referred to Dr. Dusty who saw her for mitral valve evaluation.  Upon review of studies at that time, regurgitation thought to be more in the moderate range and likely functional.   Patient had PYP amyloid scan in 12/18, this was suggestive of transthyretin amyloidosis.  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   Admission for A/C diastolic HF 2/6-2/05/2018. Required IV diuresis, then transitioned to 80 mg PO lasix  BID. Metop was DC'd due to low blood pressures. Cardiac MRI strongly suggestive of amyloidosis, moderate to severe MR (moderate by regurgitant fraction 41% but severe visually). Echo repeated: EF 40-45%, moderate to severe LVH, severe MR, severe dilation of LA and RA, moderate TR, PASP 49.  She had Mitraclip x 2 placed in 5/19.  Repeat echo post-mitraclip showed EF 55-60%, moderate LVH, mean gradient 3 mmHg across the MV with trace-mild MR.   Cardiomems was placed 6/19.    Echo 6/20 showed EF 55% with moderate LVH, s/p Mitraclip with mean gradient 3 mmHg, mild MR, normal RV, PASP 41 mmHg.   Echo in 11/21 showed EF 50-55%, moderate LVH, normal RV, PASP 44 mmHg, s/p Mitraclip with mean gradient 5 mmHg with mild MR.   Echo 2/23 showed EF 55-60%, mild LVH, mild to moderate MR post Mitraclip.  Echo in 3/24  with EF 55%, mild LVH, normal RV, PASP 41 mmHg, s/p Mitraclip with mild-moderate MR (mean gradient 3 mmHg).   S/p L hip arthroplasty 10/24.  Today she returns for HF/amyloidosis follow up. No dyspnea with ADLs, walking to mailbox.  No problems with stairs.  No lightheadedness.  No chest pain.  No orthopnea/PND.  Overall, feels like she is doing ok.  Weight down 8 lbs.  Taking all meds.   Cardiomems PADP 14 (goal 19).   Labs (11/18): K 3.9, creatinine 1.43, SPEP negative Labs (2/19): K 3.8, creatinine 1.40, hgb 13.4 Labs (5/19): K 4.1, creatinine 1.4, hgb 10.8 Labs (6/19): K 4.2, creatinine 1.4, hgb 12.4 Labs (7/19): K 4, creatinine 1.38 Labs (1/20): K 3.6, creatinine 1.36 Labs (3/20): K 3.6, creatinine 1.31, LDL 107 Labs (7/20): creatinine 1.48 Labs (10/20): LDL 87 Labs (12/21): LDL 127, BNP 485 Labs (1/22): K 4.6, creatinine 1.65 Labs (6/22): K 4.1, creatinine 1.67, LDL 142 Labs (11/22): LDL 849 (was not taking Repatha ), K 4.2, creatinine 1.64 Labs (2/23): K 4.0, creatinine 1.68, LDL 110, HDL 55 Labs (6/23): K 4, creatinine 1.71, LDL 103 Labs (9/23): K 4.0, creatinine 1.65 Labs (12/23): BNP 133, K 3.8, creatinine 1.71 Labs (3/24): LDL 126, BNP 151, K 4, creatinine 1.86 Labs (10/24): K 3.4, creatinine 1.97 Labs (12/24): LDL 64 Labs (2/25): K 4.2, creatinine 1.9  PMH: 1. HTN 2. Hyperlipidemia 3. 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  LINQ monitor placed.  4. Chronic diastolic CHF: She has Cardiomems.  - LHC (6/17): Normal  coronaries.  - Echo (10/18): EF 50-55%, severe LVH with speckled myocardium concerning for amyloidosis, restrictive diastolic function with septal flattening, severe MR, moderate-severe TR, severe biatrial enlargement, PASP 54 mmHg.  - PYP amyloid scan: Grade 3, suggestive of transthyretin amyloidosis.  - Echo (09/2017): EF 40-45%, moderate to severe LVH, severe MR, severe dilation of LA and RA, moderate TR, PASP 49 - Cardiac MRI (02/19): Moderate LVH, EF 45% with  diffuse hypokinesis, mildly dilated RV with mildly decreased systolic function, mod to severe MR (moderate by 41% regurgitant fraction, severe visually), severe biatrial enlargement, LGE pattern strongly suggestive of cardiac amyloidosis - Genetic testing showed genetic TTR amyloidosis from Val142Ile.   - Echo (5/19): EF 55-60%, moderate LVH c/w amyloidosis, Mitraclip x 2 with mean MV gradient 3 mmHg, trace-mild MR, moderate TR, PASP 62 mmHg.  - Echo (6/19): EF 55-60%, moderate LVH with speckled myocardium, trivial MR s/p Mitraclip with mean gradient 2 mmHg, PASP 61.  - Echo (6/20): EF 55% with moderate LVH, s/p Mitraclip with mean gradient 3 mmHg, mild MR, normal RV, PASP 41 mmHg.  - Echo (11/21): EF 50-55%, moderate LVH, normal RV, PASP 44 mmHg, s/p Mitraclip with mean gradient 5 mmHg with mild MR. - Echo (2/23): EF 55-60%, mild LVH, mild to moderate MR post Mitraclip. - Echo (3/24):  EF 55%, mild LVH, normal RV, PASP 41 mmHg, s/p Mitraclip with mild-moderate MR (mean gradient 3 mmHg).  5. Mitral regurgitation: Think mixed functional/degenerative - TEE (7/18): EF 45-50%, moderate-severe MR, mild-moderate TR.  - Echo (10/18) read as severe MR.  - 2/19 echo with severe MR.  - 2/19 cardiac MRI with moderate to severe MR.  - Mitraclip x 2 in 5/19.   - Echo 5/19 post-Mitraclip with mean MV gradient 3 mmHg, trace-mild MR.  - Echo 6/20 post-Mitraclip with mean MV gradient 3 mmHg, mild MR.  - Echo 11/21 post-Mitraclip with mean MV gradient 5 mmHg, mild MR - Echo 2/23 EF 55-60%, mild LVH, mild to moderate MR post Mitraclip. - Echo 3/24 s/p Mitraclip with mild-moderate MR and mean gradient 3 mmHg.  6. CKD stage 3 7. Hypothyroidism 8. H/o renal cell CA s/p nephrectomy.  9. Bilateral carpal tunnel releases.  10. ABIs (6/20): Normal 11. OA: s/p left hip arthroplasty 10/24 12. Dementia: Mild.   Social History   Socioeconomic History   Marital status: Divorced    Spouse name: Not on file   Number  of children: 5   Years of education: 12   Highest education level: Not on file  Occupational History   Occupation: retired    Associate Professor: [employer]  Tobacco Use   Smoking status: Never   Smokeless tobacco: Never  Vaping Use   Vaping status: Never Used  Substance and Sexual Activity   Alcohol  use: No   Drug use: No   Sexual activity: Not Currently  Other Topics Concern   Not on file  Social History Narrative   Lives alone in a one story home.  Her son stays with her some.  Has 5 children, 10 grandchildren and 10 great grandchildren.  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  Education: high school.        07/14/18- list food insecurity but is aware of food pantries and gets $15 in food stamps, reports issues with medical bills but has insurance and gets extra help through medicaid for premiums- encouraged pt to call billing to set up reasonable payment plan so it doesn't go to collections.  Left Handed   Social Drivers of Health   Financial Resource Strain: Low Risk  (09/04/2019)   Overall Financial Resource Strain (CARDIA)    Difficulty of Paying Living Expenses: Not hard at all  Food Insecurity: No Food Insecurity (05/16/2023)   Hunger Vital Sign    Worried About Running Out of Food in the Last Year: Never true    Ran Out of Food in the Last Year: Never true  Transportation Needs: No Transportation Needs (05/16/2023)   PRAPARE - Administrator, Civil Service (Medical): No    Lack of Transportation (Non-Medical): No  Physical Activity: Not on file  Stress: Not on file  Social Connections: Not on file  Intimate Partner Violence: Not At Risk (05/16/2023)   Humiliation, Afraid, Rape, and Kick questionnaire    Fear of Current or Ex-Partner: No    Emotionally Abused: No    Physically Abused: No    Sexually Abused: No   Family History  Problem Relation Age of Onset   Stroke Brother    Diabetes Mellitus II Brother    Diabetes Mellitus II Sister    Breast cancer  Sister    Colon cancer Sister    ROS: All systems reviewed and negative except as per HPI.   Current Outpatient Medications  Medication Sig Dispense Refill   acetaminophen  (TYLENOL ) 500 MG tablet Take 1,000 mg by mouth daily.     aspirin  81 MG tablet Take 1 tablet (81 mg total) daily by mouth.     Bempedoic Acid -Ezetimibe  (NEXLIZET ) 180-10 MG TABS Take 1 tablet by mouth daily. 90 tablet 3   carvedilol  (COREG ) 6.25 MG tablet TAKE 1 TABLET(6.25 MG) BY MOUTH TWICE DAILY WITH A MEAL 180 tablet 3   clopidogrel  (PLAVIX ) 75 MG tablet Take 1 tablet (75 mg total) by mouth daily. 30 tablet 1   Evolocumab  (REPATHA  SURECLICK) 140 MG/ML SOAJ Inject 140 mg into the skin every 14 (fourteen) days. 6 mL 3   feeding supplement, ENSURE ENLIVE, (ENSURE ENLIVE) LIQD Take 237 mLs by mouth 2 (two) times daily between meals. 237 mL 0   furosemide  (LASIX ) 20 MG tablet Take 2 tablets (40 mg total) by mouth daily. takes 2 tablets in the morning then take 1 tablet by mouth every evening.     gabapentin  (NEURONTIN ) 300 MG capsule Take 300 mg by mouth 2 (two) times daily.     Hypromellose (ARTIFICIAL TEARS OP) Place 1 drop into both eyes 2 (two) times daily.     JARDIANCE  10 MG TABS tablet TAKE 1 TABLET(10 MG) BY MOUTH DAILY BEFORE BREAKFAST 90 tablet 1   L-Methylfolate-B12-B6-B2 (CEREFOLIN) 12-31-48-5 MG TABS Take 1 tablet by mouth daily. 90 tablet 3   levothyroxine  (SYNTHROID , LEVOTHROID) 25 MCG tablet Take 25 mcg by mouth daily before breakfast.     memantine  (NAMENDA ) 10 MG tablet TAKE 1 TABLET(10 MG) BY MOUTH TWICE DAILY 60 tablet 3   pantoprazole  (PROTONIX ) 40 MG tablet TAKE 1 TABLET(40 MG) BY MOUTH DAILY 90 tablet 3   potassium chloride  SA (KLOR-CON ) 20 MEQ tablet Take 1 tablet (20 mEq total) by mouth daily. 60 tablet 0   Tafamidis  (VYNDAMAX ) 61 MG CAPS Take 1 capsule (61 mg total) by mouth daily. 30 capsule 11   Vitamin A 3 MG (10000 UT) TABS Take 3 mg by mouth daily.     vutrisiran  sodium (AMVUTTRA ) 25 MG/0.5ML  syringe Inject 0.5 mLs (25 mg total) into the skin every 3 (three) months. 0.5 mL 3  No current facility-administered medications for this visit.   LMP 02/12/2013   Wt Readings from Last 3 Encounters:  10/27/23 67.1 kg (148 lb)  09/30/23 70 kg (154 lb 5.2 oz)  09/20/23 70.5 kg (155 lb 6.4 oz)   General: NAD Neck: No JVD. +Goiter.   Lungs: Clear to auscultation bilaterally with normal respiratory effort. CV: Nondisplaced PMI.  Heart regular S1/S2, no S3/S4, no murmur.  No peripheral edema.  No carotid bruit.  Normal pedal pulses.  Abdomen: Soft, nontender, no hepatosplenomegaly, no distention.  Skin: Intact without lesions or rashes.  Neurologic: Alert and oriented x 3.  Psych: Normal affect. Extremities: No clubbing or cyanosis.  HEENT: Normal.   Assessment/Plan: 1. Chronic diastolic CHF: Normal EF on 10/18 echo with severe LVH and speckled myocardium consistent with cardiac amyloidosis.  SPEP was negative. 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 Cardiac MRI also strongly suggestive of cardiac amyloidosis. Echo 3/24 showed EF 55%, mild LVH. NYHA class II symptoms.  She is not volume overloaded by Cardiomems or exam.  - I will arrange for echo.   - Continue Coreg  6.25 mg bid. - Continue Lasix  40 qam/20 qpm. Check BMET/BNP.  - Continue Jardiance  10 mg daily.  2. Mitral regurgitation: Mitral regurgitation seems to fluctuate somewhat based on loading conditions.  Most recently, echo in 2/19 suggested severe MR and cardiac MRI suggested moderate to severe MR (moderate by 41% regurgitant fraction but visually severe). Suspect that the etiology is mixed - functional as well as degenerative with thickening and restriction of posterior leaflet. She is now s/p Mitraclip procedure in 5/19. Echo in 11/21 showed mean gradient 5 mmHg across the MV with Mitraclip in place, MR is mild. Echo 3/24  showed mild to moderate MR with mean gradient 3 mmHg, stable. - I will arrange for echo.  3. CKD: Stage III.  Continue SGLT2i. BMET today.  4. CVA: 7/18. Has LINQ monitor, no atrial fibrillation detected so far.  - Continue ASA 81.   - Continue Plavix . 5. 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  Neuropathic pain has improved with Amvuttra .  - Continue Amvuttra .  6. Cardiac amyloidosis: Hereditary TTR amyloidosis.  She has peripheral neuropathy as well as bilateral carpal tunnel syndrome.   - She is now on tafamidis  and Amvuttra , continue.   7. Hyperlipidemia: Goal LDL < 70 with CVA.  Cannot take statins due to rhabdomyolysis with Crestor  (interaction with tafamidis ). Goal LDL < 55.  - Continue Repatha , Zetia  and Nexlizet . .  - She is followed by Lipid Clinic 8. Osteoarthritis: s/p left hip arthroplasty.  9. Goiter: Goiter noted on neck exam.  She has hypothyroidism and is on Levoxyl .   Follow up 4 months with APP  I spent 31 minutes reviewing records, interviewing/examining patient, and managing orders.     Katie [name],  02/10/2024

## 2024-02-13 ENCOUNTER — Other Ambulatory Visit: Payer: Self-pay

## 2024-02-13 ENCOUNTER — Encounter (HOSPITAL_COMMUNITY)

## 2024-03-09 NOTE — Progress Notes (Signed)
 PCP: Dr. Rexanne HF Cardiology: Dr. Rolan  78 y.o. with history of chronic diastolic CHF, mitral regurgitation, CVA, and CKD returns for followup of CHF.   Patient was initially admitted with diastolic CHF in 6/17. Echo showed EF 60-65% with grade 3 diastolic dysfunction and moderate MR.  She had cath showing no coronary disease. 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  TEE this admission showed moderate-severe MR.  She was admitted again in 10/18 with acute on chronic diastolic CHF. Echo this admission showed EF 50-55%, severe LVH, restrictive diastolic dysfunction, severe MR, moderate-severe TR.  She was referred to Dr. Dusty who saw her for mitral valve evaluation.  Upon review of studies at that time, regurgitation thought to be more in the moderate range and likely functional.   Patient had PYP amyloid scan in 12/18, this was suggestive of transthyretin amyloidosis.  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   Admission for A/C diastolic HF 2/6-2/05/2018. Required IV diuresis, then transitioned to 80 mg PO lasix  BID. Metop was DC'd due to low blood pressures. Cardiac MRI strongly suggestive of amyloidosis, moderate to severe MR (moderate by regurgitant fraction 41% but severe visually). Echo repeated: EF 40-45%, moderate to severe LVH, severe MR, severe dilation of LA and RA, moderate TR, PASP 49.  She had Mitraclip x 2 placed in 5/19.  Repeat echo post-mitraclip showed EF 55-60%, moderate LVH, mean gradient 3 mmHg across the MV with trace-mild MR.   Cardiomems was placed 6/19.    Echo 6/20 showed EF 55% with moderate LVH, s/p Mitraclip with mean gradient 3 mmHg, mild MR, normal RV, PASP 41 mmHg.   Echo in 11/21 showed EF 50-55%, moderate LVH, normal RV, PASP 44 mmHg, s/p Mitraclip with mean gradient 5 mmHg with mild MR.   Echo 2/23 showed EF 55-60%, mild LVH, mild to moderate MR post Mitraclip.  Echo in 3/24 with EF 55%, mild LVH,  normal RV, PASP 41 mmHg, s/p Mitraclip with mild-moderate MR (mean gradient 3 mmHg).   S/p L hip arthroplasty 10/24.  Echo 4/25 showed EF 55%, moderate LVH, normal RV, intact mitraclip with mild to moderate MR.  Today she returns for HF follow up with her daughter. Overall feeling fine. She has SOB walking further distances on flat ground, walks with a cane for balance. Having memory issues. She has occasional ankle swelling. Denies palpitations, abnormal bleeding, CP, dizziness, or PND/Orthopnea. Appetite ok. Weight at home 142 pounds. Taking all medications but ran out of Plavix . Home BP 114-119/60-70s. Daughter working on getting [facility] for her.   Cardiomems PADP 14 (goal 15). Personally reviewed  Labs (11/18): K 3.9, creatinine 1.43, SPEP negative Labs (3/24): LDL 126, BNP 151, K 4, creatinine 1.86 Labs (10/24): K 3.4, creatinine 1.97 Labs (12/24): LDL 64 Labs (2/25): K 4.2, creatinine 1.9 Labs (3/25): K 3.9, creatinine 2.29  PMH: 1. HTN 2. Hyperlipidemia 3. 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  LINQ monitor placed.  4. Chronic diastolic CHF: She has Cardiomems.  - LHC (6/17): Normal coronaries.  - Echo (10/18): EF 50-55%, severe LVH with speckled myocardium concerning for amyloidosis, restrictive diastolic function with septal flattening, severe MR, moderate-severe TR, severe biatrial enlargement, PASP 54 mmHg.  - PYP amyloid scan: Grade 3, suggestive of transthyretin amyloidosis.  - Echo (09/2017): EF 40-45%, moderate to severe LVH, severe MR, severe dilation of LA and RA, moderate TR, PASP 49 - Cardiac MRI (02/19): Moderate LVH, EF 45% with  diffuse hypokinesis, mildly dilated RV with mildly decreased systolic function, mod to severe MR (moderate by 41% regurgitant fraction, severe visually), severe biatrial enlargement, LGE pattern strongly suggestive of cardiac amyloidosis - Genetic testing showed genetic TTR amyloidosis from Val142Ile.   - Echo (5/19): EF 55-60%, moderate LVH c/w  amyloidosis, Mitraclip x 2 with mean MV gradient 3 mmHg, trace-mild MR, moderate TR, PASP 62 mmHg.  - Echo (6/19): EF 55-60%, moderate LVH with speckled myocardium, trivial MR s/p Mitraclip with mean gradient 2 mmHg, PASP 61.  - Echo (6/20): EF 55% with moderate LVH, s/p Mitraclip with mean gradient 3 mmHg, mild MR, normal RV, PASP 41 mmHg.  - Echo (11/21): EF 50-55%, moderate LVH, normal RV, PASP 44 mmHg, s/p Mitraclip with mean gradient 5 mmHg with mild MR. - Echo (2/23): EF 55-60%, mild LVH, mild to moderate MR post Mitraclip. - Echo (3/24):  EF 55%, mild LVH, normal RV, PASP 41 mmHg, s/p Mitraclip with mild-moderate MR (mean gradient 3 mmHg).  - Echo 4/25: EF 55%, normal RV, mild to moderate MR and mean gradient 3 mmHg 5. Mitral regurgitation: Think mixed functional/degenerative - TEE (7/18): EF 45-50%, moderate-severe MR, mild-moderate TR.  - Echo (10/18) read as severe MR.  - 2/19 echo with severe MR.  - 2/19 cardiac MRI with moderate to severe MR.  - Mitraclip x 2 in 5/19.   - Echo 5/19 post-Mitraclip with mean MV gradient 3 mmHg, trace-mild MR.  - Echo 6/20 post-Mitraclip with mean MV gradient 3 mmHg, mild MR.  - Echo 11/21 post-Mitraclip with mean MV gradient 5 mmHg, mild MR - Echo 2/23 EF 55-60%, mild LVH, mild to moderate MR post Mitraclip. - Echo 3/24 s/p Mitraclip with mild-moderate MR and mean gradient 3 mmHg.  - Echo 4/25: EF 55%, normal RV, mild to moderate MR and mean gradient 3 mmHg 6. CKD stage 3 7. Hypothyroidism 8. H/o renal cell CA s/p nephrectomy.  9. Bilateral carpal tunnel releases.  10. ABIs (6/20): Normal 11. OA: s/p left hip arthroplasty 10/24 12. Dementia: Mild.   Social History   Socioeconomic History   Marital status: Divorced    Spouse name: Not on file   Number of children: 5   Years of education: 12   Highest education level: Not on file  Occupational History   Occupation: retired    Associate Professor: [employer]  Tobacco Use   Smoking status:  Never   Smokeless tobacco: Never  Vaping Use   Vaping status: Never Used  Substance and Sexual Activity   Alcohol  use: No   Drug use: No   Sexual activity: Not Currently  Other Topics Concern   Not on file  Social History Narrative   Lives alone in a one story home.  Her son stays with her some.  Has 5 children, 10 grandchildren and 10 great grandchildren.  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  Education: high school.        07/14/18- list food insecurity but is aware of food pantries and gets $15 in food stamps, reports issues with medical bills but has insurance and gets extra help through medicaid for premiums- encouraged pt to call billing to set up reasonable payment plan so it doesn't go to collections.      Left Handed   Social Drivers of Health   Financial Resource Strain: Low Risk  (09/04/2019)   Overall Financial Resource Strain (CARDIA)    Difficulty of Paying Living Expenses: Not hard at all  Food Insecurity: No  Food Insecurity (05/16/2023)   Hunger Vital Sign    Worried About Running Out of Food in the Last Year: Never true    Ran Out of Food in the Last Year: Never true  Transportation Needs: No Transportation Needs (05/16/2023)   PRAPARE - Administrator, Civil Service (Medical): No    Lack of Transportation (Non-Medical): No  Physical Activity: Not on file  Stress: Not on file  Social Connections: Not on file  Intimate Partner Violence: Not At Risk (05/16/2023)   Humiliation, Afraid, Rape, and Kick questionnaire    Fear of Current or Ex-Partner: No    Emotionally Abused: No    Physically Abused: No    Sexually Abused: No   Family History  Problem Relation Age of Onset   Stroke Brother    Diabetes Mellitus II Brother    Diabetes Mellitus II Sister    Breast cancer Sister    Colon cancer Sister    ROS: All systems reviewed and negative except as per HPI.   Current Outpatient Medications  Medication Sig Dispense Refill   acetaminophen  (TYLENOL )  500 MG tablet Take 1,000 mg by mouth daily.     aspirin  81 MG tablet Take 1 tablet (81 mg total) daily by mouth.     Bempedoic Acid -Ezetimibe  (NEXLIZET ) 180-10 MG TABS Take 1 tablet by mouth daily. 90 tablet 3   carvedilol  (COREG ) 6.25 MG tablet TAKE 1 TABLET(6.25 MG) BY MOUTH TWICE DAILY WITH A MEAL 180 tablet 3   Evolocumab  (REPATHA  SURECLICK) 140 MG/ML SOAJ Inject 140 mg into the skin every 14 (fourteen) days. 6 mL 3   feeding supplement, ENSURE ENLIVE, (ENSURE ENLIVE) LIQD Take 237 mLs by mouth 2 (two) times daily between meals. 237 mL 0   folic acid  (FOLVITE ) 1 MG tablet Take 1 mg by mouth daily.     furosemide  (LASIX ) 20 MG tablet Take 2 tablets (40 mg total) by mouth daily. takes 2 tablets in the morning then take 1 tablet by mouth every evening.     gabapentin  (NEURONTIN ) 300 MG capsule Take 300 mg by mouth 2 (two) times daily.     Hypromellose (ARTIFICIAL TEARS OP) Place 1 drop into both eyes 2 (two) times daily.     JARDIANCE  10 MG TABS tablet TAKE 1 TABLET(10 MG) BY MOUTH DAILY BEFORE BREAKFAST 90 tablet 1   L-Methylfolate-B12-B6-B2 (CEREFOLIN) 12-31-48-5 MG TABS Take 1 tablet by mouth daily. 90 tablet 3   levothyroxine  (SYNTHROID , LEVOTHROID) 25 MCG tablet Take 25 mcg by mouth daily before breakfast.     memantine  (NAMENDA ) 10 MG tablet TAKE 1 TABLET(10 MG) BY MOUTH TWICE DAILY 60 tablet 3   pantoprazole  (PROTONIX ) 40 MG tablet TAKE 1 TABLET(40 MG) BY MOUTH DAILY 90 tablet 3   potassium chloride  SA (KLOR-CON ) 20 MEQ tablet Take 1 tablet (20 mEq total) by mouth daily. 60 tablet 0   Tafamidis  (VYNDAMAX ) 61 MG CAPS Take 1 capsule (61 mg total) by mouth daily. 30 capsule 11   Vitamin A 3 MG (10000 UT) TABS Take 3 mg by mouth daily.     vutrisiran  sodium (AMVUTTRA ) 25 MG/0.5ML syringe Inject 0.5 mLs (25 mg total) into the skin every 3 (three) months. 0.5 mL 3   clopidogrel  (PLAVIX ) 75 MG tablet Take 1 tablet (75 mg total) by mouth daily. (Patient not taking: Reported on 03/13/2024) 30 tablet 1    No current facility-administered medications for this encounter.   BP 122/70   Pulse 87  Wt 66 kg (145 lb 6.4 oz)   LMP 02/12/2013   SpO2 98%   BMI 24.20 kg/m   Wt Readings from Last 3 Encounters:  03/13/24 66 kg (145 lb 6.4 oz)  10/27/23 67.1 kg (148 lb)  09/30/23 70 kg (154 lb 5.2 oz)   Physical Exam General:  NAD. No resp difficulty, walked into clinic HEENT: Normal Neck: Supple. No JVD. + goiter Cor: Regular rate & rhythm. No rubs, gallops or murmurs. Lungs: Clear Abdomen: Soft, nontender, nondistended.  Extremities: No cyanosis, clubbing, rash, edema Neuro: Alert & oriented x 3, moves all 4 extremities w/o difficulty. Affect pleasant.  Assessment/Plan: 1. Chronic diastolic CHF: Normal EF on 10/18 echo with severe LVH and speckled myocardium consistent with cardiac amyloidosis.  SPEP was negative. 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 Cardiac MRI also strongly suggestive of cardiac amyloidosis. Echo 3/24 showed EF 55%, mild LVH. Echo 4/25 showed EF 55%, normal RV. NYHA class II symptoms.  She is not volume overloaded by Cardiomems or exam.  - Continue Coreg  6.25 mg bid. - Continue Lasix  40 mg daily + 20 KCL daily. BMET/BNP today - Continue Jardiance  10 mg daily.  2. Mitral regurgitation: Mitral regurgitation seems to fluctuate somewhat based on loading conditions.  Most recently, echo in 2/19 suggested severe MR and cardiac MRI suggested moderate to severe MR (moderate by 41% regurgitant fraction but visually severe). Suspect that the etiology is mixed - functional as well as degenerative with thickening and restriction of posterior leaflet. She is now s/p Mitraclip procedure in 5/19. Echo in 11/21 showed mean gradient 5 mmHg across the MV with Mitraclip in place, MR is mild. Echo 3/24 showed mild to moderate MR with mean gradient 3 mmHg, stable. Echo 4/25: EF 55%,  normal RV, mild to moderate MR and mean gradient 3 mmHg 3. CKD: Stage III.  Continue SGLT2i. BMET today.  4. CVA: 7/18. Has LINQ monitor, no atrial fibrillation detected so far.  - Continue ASA 81.   - Restart  Plavix  75. 5. 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  Neuropathic pain has improved with Amvuttra .  - Continue Amvuttra .  6. Cardiac amyloidosis: Hereditary TTR amyloidosis.  She has peripheral neuropathy as well as bilateral carpal tunnel syndrome.   - She is now on tafamidis  and Amvuttra , continue.   7. Hyperlipidemia: Goal LDL < 70 with CVA.  Cannot take statins due to rhabdomyolysis with Crestor  (interaction with tafamidis ). Goal LDL < 55.  - Continue Repatha , Zetia  and Nexlizet . .  - She is followed by [facility] 8. Osteoarthritis: s/p left hip arthroplasty. No change 9. Goiter: She has hypothyroidism and is on Levoxyl .   Follow up in 4 months with Dr. Rolan Katie CHRISTELLA [name], Katie [name] 03/13/2024

## 2024-03-12 ENCOUNTER — Telehealth (HOSPITAL_COMMUNITY): Payer: Self-pay | Admitting: *Deleted

## 2024-03-12 ENCOUNTER — Telehealth (HOSPITAL_COMMUNITY): Payer: Self-pay

## 2024-03-12 ENCOUNTER — Other Ambulatory Visit: Payer: Self-pay | Admitting: Internal Medicine

## 2024-03-12 DIAGNOSIS — Z1231 Encounter for screening mammogram for malignant neoplasm of breast: Secondary | ICD-10-CM

## 2024-03-12 NOTE — Telephone Encounter (Signed)
 Called to confirm/remind patient of their appointment at the [facility] on 03/13/24.        Appointment:              [x] Confirmed             [] Left mess              [] No answer/No voice mail             [] Phone not in service   Patient reminded to bring all medications and/or complete list.   Confirmed patient has transportation. Gave directions, instructed to utilize valet parking.

## 2024-03-12 NOTE — Telephone Encounter (Signed)
 Called to confirm/remind patient of their appointment at the [facility] on 03/13/24.   Appointment:   [x] Confirmed  [] Left mess   [] No answer/No voice mail  [] VM Full/unable to leave message  [] Phone not in service  Patient reminded to bring all medications and/or complete list.  Confirmed patient has transportation. Gave directions, instructed to utilize valet parking.

## 2024-03-13 ENCOUNTER — Ambulatory Visit (HOSPITAL_COMMUNITY)
Admission: RE | Admit: 2024-03-13 | Discharge: 2024-03-13 | Disposition: A | Discharge status: Home / Self Care | Source: Ambulatory Visit | Attending: Family Medicine | Admitting: Family Medicine

## 2024-03-13 ENCOUNTER — Encounter (HOSPITAL_COMMUNITY): Payer: Self-pay

## 2024-03-13 ENCOUNTER — Ambulatory Visit (HOSPITAL_COMMUNITY): Payer: Self-pay | Admitting: Family Medicine

## 2024-03-13 ENCOUNTER — Other Ambulatory Visit: Payer: Self-pay

## 2024-03-13 VITALS — BP 122/70 | HR 87 | Wt 145.4 lb

## 2024-03-13 DIAGNOSIS — I340 Nonrheumatic mitral (valve) insufficiency: Secondary | ICD-10-CM

## 2024-03-13 DIAGNOSIS — E785 Hyperlipidemia, unspecified: Secondary | ICD-10-CM | POA: Diagnosis not present

## 2024-03-13 DIAGNOSIS — Z8673 Personal history of transient ischemic attack (TIA), and cerebral infarction without residual deficits: Secondary | ICD-10-CM | POA: Insufficient documentation

## 2024-03-13 DIAGNOSIS — E049 Nontoxic goiter, unspecified: Secondary | ICD-10-CM | POA: Insufficient documentation

## 2024-03-13 DIAGNOSIS — Z79899 Other long term (current) drug therapy: Secondary | ICD-10-CM | POA: Insufficient documentation

## 2024-03-13 DIAGNOSIS — N183 Chronic kidney disease, stage 3 unspecified: Secondary | ICD-10-CM | POA: Diagnosis not present

## 2024-03-13 DIAGNOSIS — E854 Organ-limited amyloidosis: Secondary | ICD-10-CM | POA: Diagnosis not present

## 2024-03-13 DIAGNOSIS — I130 Hypertensive heart and chronic kidney disease with heart failure and stage 1 through stage 4 chronic kidney disease, or unspecified chronic kidney disease: Secondary | ICD-10-CM | POA: Insufficient documentation

## 2024-03-13 DIAGNOSIS — Z7902 Long term (current) use of antithrombotics/antiplatelets: Secondary | ICD-10-CM | POA: Insufficient documentation

## 2024-03-13 DIAGNOSIS — E852 Heredofamilial amyloidosis, unspecified: Secondary | ICD-10-CM

## 2024-03-13 DIAGNOSIS — G629 Polyneuropathy, unspecified: Secondary | ICD-10-CM | POA: Diagnosis not present

## 2024-03-13 DIAGNOSIS — I5032 Chronic diastolic (congestive) heart failure: Secondary | ICD-10-CM | POA: Diagnosis not present

## 2024-03-13 DIAGNOSIS — E782 Mixed hyperlipidemia: Secondary | ICD-10-CM

## 2024-03-13 DIAGNOSIS — M1990 Unspecified osteoarthritis, unspecified site: Secondary | ICD-10-CM | POA: Insufficient documentation

## 2024-03-13 DIAGNOSIS — D472 Monoclonal gammopathy: Secondary | ICD-10-CM

## 2024-03-13 DIAGNOSIS — M1610 Unilateral primary osteoarthritis, unspecified hip: Secondary | ICD-10-CM

## 2024-03-13 DIAGNOSIS — Z7984 Long term (current) use of oral hypoglycemic drugs: Secondary | ICD-10-CM | POA: Insufficient documentation

## 2024-03-13 DIAGNOSIS — G990 Autonomic neuropathy in diseases classified elsewhere: Secondary | ICD-10-CM

## 2024-03-13 LAB — BASIC METABOLIC PANEL WITH GFR
Anion gap: 5 (ref 5–15)
BUN: 17 mg/dL (ref 8–23)
CO2: 25 mmol/L (ref 22–32)
Calcium: 9.8 mg/dL (ref 8.9–10.3)
Chloride: 109 mmol/L (ref 98–111)
Creatinine, Ser: 1.72 mg/dL — ABNORMAL HIGH (ref 0.44–1.00)
GFR, Estimated: 30 mL/min — ABNORMAL LOW (ref >60)
Glucose, Bld: 82 mg/dL (ref 70–99)
Potassium: 4.5 mmol/L (ref 3.5–5.1)
Sodium: 139 mmol/L (ref 135–145)

## 2024-03-13 LAB — BRAIN NATRIURETIC PEPTIDE: B Natriuretic Peptide: 443.2 pg/mL — ABNORMAL HIGH (ref 0.0–100.0)

## 2024-03-13 MED ORDER — CLOPIDOGREL BISULFATE 75 MG PO TABS: 75.0000 mg | ORAL_TABLET | Freq: Every day | ORAL | 11 refills | Status: AC

## 2024-03-13 NOTE — Patient Instructions (Addendum)
 Thank you for coming in today  If you had labs drawn today, any labs that are abnormal the clinic will call you No news is good news  Medications: Restart Plavix  75 mg 1 tablet daily  Follow up appointments:  Your physician recommends that you schedule a follow-up appointment in:  4 months With Dr. Rolan  Please call our office to schedule the follow-up appointment in October 2025 for December 2025.   Do the following things EVERYDAY: Weigh yourself in the morning before breakfast. Write it down and keep it in a log. Take your medicines as prescribed Eat low salt foods--Limit salt (sodium) to 2000 mg per day.  Stay as active as you can everyday Limit all fluids for the day to less than 2 liters   At the [facility], you and your health needs are our priority. As part of our continuing mission to provide you with exceptional heart care, we have created designated Provider Care Teams. These Care Teams include your primary Cardiologist (physician) and Advanced Practice Providers (APPs- Physician Assistants and Nurse Practitioners) who all work together to provide you with the care you need, when you need it.   You may see any of the following providers on your designated Care Team at your next follow up: Dr Toribio Fuel Dr Ezra Rolan Dr. Ria Gardenia Greig Lenetta, NP Caffie Shed, [place] [facility] [place], [place] Beckey Coe, NP Tinnie Redman, PharmD   Please be sure to bring in all your medications bottles to every appointment.    Thank you for choosing [facility]  If you have any questions or concerns before your next appointment please send us  a message through [place] or call our office at [phone].    TO LEAVE A MESSAGE FOR THE NURSE SELECT OPTION 2, PLEASE LEAVE A MESSAGE INCLUDING: YOUR NAME DATE OF BIRTH CALL BACK NUMBER REASON FOR CALL**this is important as we prioritize the call  backs  YOU WILL RECEIVE A CALL BACK THE SAME DAY AS LONG AS YOU CALL BEFORE 4:00 PM

## 2024-03-13 NOTE — Progress Notes (Signed)
 Specialty Pharmacy Refill Coordination Note  Katie [name] is a 78 y.o. female contacted today regarding refills of specialty medication(s) Tafamidis  (Vyndamax )   Patient requested Delivery   Delivery date: 03/15/24   Verified address: [address] [place]   Medication will be filled on 03/14/24.

## 2024-03-14 ENCOUNTER — Other Ambulatory Visit: Payer: Self-pay

## 2024-03-14 ENCOUNTER — Other Ambulatory Visit (HOSPITAL_COMMUNITY): Payer: Self-pay

## 2024-03-14 DIAGNOSIS — E8582 Wild-type transthyretin-related (ATTR) amyloidosis: Secondary | ICD-10-CM | POA: Diagnosis not present

## 2024-03-14 DIAGNOSIS — E854 Organ-limited amyloidosis: Secondary | ICD-10-CM | POA: Diagnosis not present

## 2024-03-14 DIAGNOSIS — Z85528 Personal history of other malignant neoplasm of kidney: Secondary | ICD-10-CM | POA: Diagnosis not present

## 2024-03-14 DIAGNOSIS — I272 Pulmonary hypertension, unspecified: Secondary | ICD-10-CM | POA: Diagnosis not present

## 2024-03-14 DIAGNOSIS — E7800 Pure hypercholesterolemia, unspecified: Secondary | ICD-10-CM | POA: Diagnosis not present

## 2024-03-14 DIAGNOSIS — N184 Chronic kidney disease, stage 4 (severe): Secondary | ICD-10-CM | POA: Diagnosis not present

## 2024-03-14 DIAGNOSIS — H401131 Primary open-angle glaucoma, bilateral, mild stage: Secondary | ICD-10-CM | POA: Diagnosis not present

## 2024-03-14 DIAGNOSIS — M87059 Idiopathic aseptic necrosis of unspecified femur: Secondary | ICD-10-CM | POA: Diagnosis not present

## 2024-03-14 DIAGNOSIS — R413 Other amnesia: Secondary | ICD-10-CM | POA: Diagnosis not present

## 2024-03-14 DIAGNOSIS — Z905 Acquired absence of kidney: Secondary | ICD-10-CM | POA: Diagnosis not present

## 2024-03-14 DIAGNOSIS — M8580 Other specified disorders of bone density and structure, unspecified site: Secondary | ICD-10-CM | POA: Diagnosis not present

## 2024-03-14 DIAGNOSIS — E039 Hypothyroidism, unspecified: Secondary | ICD-10-CM | POA: Diagnosis not present

## 2024-03-15 ENCOUNTER — Other Ambulatory Visit: Payer: Self-pay

## 2024-03-15 ENCOUNTER — Other Ambulatory Visit (HOSPITAL_COMMUNITY): Payer: Self-pay

## 2024-03-15 DIAGNOSIS — I5032 Chronic diastolic (congestive) heart failure: Secondary | ICD-10-CM

## 2024-03-15 MED ORDER — PANTOPRAZOLE SODIUM 40 MG PO TBEC: 40.0000 mg | DELAYED_RELEASE_TABLET | Freq: Every day | ORAL | 3 refills | Status: DC

## 2024-03-16 ENCOUNTER — Ambulatory Visit
Admission: RE | Admit: 2024-03-16 | Discharge: 2024-03-16 | Disposition: A | Discharge status: Home / Self Care | Source: Ambulatory Visit | Attending: Internal Medicine | Admitting: Internal Medicine

## 2024-03-16 ENCOUNTER — Other Ambulatory Visit (HOSPITAL_COMMUNITY): Payer: Self-pay

## 2024-03-16 DIAGNOSIS — Z1231 Encounter for screening mammogram for malignant neoplasm of breast: Secondary | ICD-10-CM

## 2024-03-16 DIAGNOSIS — I5032 Chronic diastolic (congestive) heart failure: Secondary | ICD-10-CM

## 2024-03-16 MED ORDER — PANTOPRAZOLE SODIUM 40 MG PO TBEC: 40.0000 mg | DELAYED_RELEASE_TABLET | Freq: Every day | ORAL | 3 refills | Status: AC

## 2024-03-18 ENCOUNTER — Other Ambulatory Visit: Payer: Self-pay | Admitting: Cardiology

## 2024-03-18 DIAGNOSIS — E782 Mixed hyperlipidemia: Secondary | ICD-10-CM

## 2024-03-29 ENCOUNTER — Other Ambulatory Visit: Payer: Self-pay

## 2024-03-29 NOTE — Progress Notes (Signed)
 Specialty Pharmacy Refill Coordination Note  Katie [name] is a 78 y.o. female assessed today regarding refills of clinic administered specialty medication(s) Vutrisiran  Sodium (Amvuttra )   Clinic requested Courier to Provider Office   Delivery date: 04/10/24   Verified address: [facility] [address]   Medication will be filled on 04/09/24.   Appointment: 09.11.25

## 2024-04-03 ENCOUNTER — Encounter (HOSPITAL_COMMUNITY)

## 2024-04-04 ENCOUNTER — Ambulatory Visit (INDEPENDENT_AMBULATORY_CARE_PROVIDER_SITE_OTHER): Payer: 59 | Admitting: Neurology

## 2024-04-04 ENCOUNTER — Encounter: Payer: Self-pay | Admitting: Neurology

## 2024-04-04 VITALS — BP 140/80 | HR 66 | Ht 62.0 in | Wt 146.0 lb

## 2024-04-04 DIAGNOSIS — Z8673 Personal history of transient ischemic attack (TIA), and cerebral infarction without residual deficits: Secondary | ICD-10-CM | POA: Diagnosis not present

## 2024-04-04 DIAGNOSIS — F0280 Dementia in other diseases classified elsewhere without behavioral disturbance: Secondary | ICD-10-CM | POA: Diagnosis not present

## 2024-04-04 DIAGNOSIS — G309 Alzheimer's disease, unspecified: Secondary | ICD-10-CM | POA: Diagnosis not present

## 2024-04-04 DIAGNOSIS — F0150 Vascular dementia without behavioral disturbance: Secondary | ICD-10-CM

## 2024-04-04 NOTE — Progress Notes (Signed)
 [facility] [address] [place]. [place] [address] [phone]       OFFICE FOLLOW UP VISIT NOTE  [name] Date of Birth:  03/28/1946 Medical Record Number:  [id number]   Referring MD:  Katie [name]  Reason for Referral:  memory loss  HPI: Katie [name] is a 78 year old pleasant African-American lady seen today for initial office consultation visit for memory loss.  She is accompanied by her daughter Katie [name] today.  History is obtained from them and review of electronic medical records.  I personally reviewed pertinent available imaging films in PACS.  He has past medical history of amyloid cardiac disease, congestive heart failure, chronic kidney disease, hypertension, hyperlipidemia, hypothyroidism, mitral regurgitation, pulmonary hypertension.  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  Residual deficits.,  Peripheral neuropathy for which she follows up with Dr. Tonita [name].  Patient states that she is not having significant memory or cognitive difficulties however the daughter has noticed for the last 1 year or so that she is forgetful.  She often forgets her appointments.  She is   often disoriented and confuses easily.  She is still quite independent and lives by herself.  She drives a little.  He has not exhibited any unsafe behavior.  He does get frustrated easily at times particularly when she cannot find the right or complete sentences.  Any delusions, hallucinations agitation.  Any headaches, gait or balance difficulties.  She has history of dementia and Parkinson's close as well as mother She has history of left cerebellar stroke in 2018 due to distal basilar artery occlusion she did remarkably well with mild residual difficulties in gait and balance..  She does see Dr [name] local  neurologist for follow-up peripheral neuropathy.     She has had no recent falls or injuries.  She is on Plavix  for stroke prevention and tolerating it  well without it.  Her blood pressure cholesterol sugar has not been under good lipid profile on 10/04/2018 echocardiogram on 10/26/22 showed ejection fraction of 55% but severely dilated left atrium. Update 09/20/2023 : She returns for follow-up after last visit 6 months ago.  She is accompanied by her daughter.  They states she is tolerating Namenda  well without dizziness, drowsiness or other side effects.  They have noticed slight improvement in her cognition.  Her memory is better.  She is still living alone and is quite independent in activities of daily living.  She has not exhibited any delirium, confusion, disorientation.  She has not shown any agitation or violent behavior.  She has not exhibited any unsafe behavior.  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  She has since started taking folic acid  daily.  She has not had any follow-up homocystine labs checked yet.  EEG on 02/21/2023 was normal.  MRI scan of the brain was ordered but for unclear reason has not been done.  She however had a fall on 04/17/2023 and was seen in the ER and had CT scan of the head which showed old left parietal and cerebellar infarcts but no acute finding.  She has no new complaints.  On Mini-Mental status testing today she scored 20/30. Update 04/04/2024 : She returns for follow-up after last visit 6 months ago.  She states her memory is better.  She continues to live alone.  She states she is mostly independent in activities of daily living.  Patient  was found to have significantly elevated homocystine and I had suggested changing folic acid  to Cerefolin NAC but patient cannot afford it.  She continues to take Cerefolin 1 mg daily.  She has had no recurrent stroke or TIA symptoms.  She remains on aspirin  and Plavix  and tolerating well without bruising or bleeding.  She is is tolerating Set for her lipids and states that she did have follow-up lipid profile and  A1c checked by primary care physician few months ago and they were both satisfactory.  She remains on Namenda  for memory loss and is tolerating well without side effects.  On Mini-Mental status exam today she scored 20/30 which represents an improvement from 21/30 at prior visit.  She did undergo MRI scan of the brain on 10/28/2023 which shows old left superior cerebellar infarct and mild changes of small vessel disease and generalized atrophy.  She has no new complaints today.   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 s/p right nephrectomy ~2007.   History of loop recorder    Hypercholesterolemia    Hypertension    Hypothyroidism    Mitral regurgitation    Pulmonary hypertension (HCC)    Stroke (HCC)    no residual    Social History:  Social History   Socioeconomic History   Marital status: Divorced    Spouse name: Not on file   Number of children: 5   Years of education: 12   Highest education level: Not on file  Occupational History   Occupation: retired    Associate Professor: [employer]  Tobacco Use   Smoking status: Never   Smokeless tobacco: Never  Vaping Use   Vaping status: Never Used  Substance and Sexual Activity   Alcohol  use: No   Drug use: No   Sexual activity: Not Currently  Other Topics Concern   Not on file  Social History Narrative   Lives alone in a one story home.  Her son stays with her some.  Has 5 children, 10 grandchildren and 10 great grandchildren.  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  Education: high school.         07/14/18- list food insecurity but is aware of food pantries and gets $15 in food stamps, reports issues with medical bills but has insurance and gets extra help through medicaid for premiums- encouraged pt to call billing to set up reasonable payment plan so it doesn't go to collections.      Left Handed   Social Drivers of Health   Financial Resource Strain: Low Risk  (09/04/2019)   Overall Financial Resource Strain (CARDIA)    Difficulty of Paying Living Expenses: Not hard at all  Food Insecurity: No Food Insecurity (05/16/2023)   Hunger Vital Sign    Worried About Running Out of Food in the Last Year: Never true    Ran Out of Food in the Last Year: Never true  Transportation Needs: No Transportation Needs (05/16/2023)   PRAPARE -  Administrator, Civil Service (Medical): No    Lack of Transportation (Non-Medical): No  Physical Activity: Not on file  Stress: Not on file  Social Connections: Not on file  Intimate Partner Violence: Not At Risk (05/16/2023)   Humiliation, Afraid, Rape, and Kick questionnaire    Fear of Current or Ex-Partner: No    Emotionally Abused: No    Physically Abused: No    Sexually Abused: No    Medications:   Current Outpatient Medications on File Prior to Visit  Medication Sig Dispense Refill   acetaminophen  (TYLENOL ) 500 MG tablet Take 1,000 mg by mouth daily.     aspirin  81 MG tablet Take 1 tablet (81 mg total) daily by mouth.     Bempedoic Acid -Ezetimibe  (NEXLIZET ) 180-10 MG TABS Take 1 tablet by mouth daily. 90 tablet 3   carvedilol  (COREG ) 6.25 MG tablet TAKE 1 TABLET(6.25 MG) BY MOUTH TWICE DAILY WITH A MEAL 180 tablet 3   clopidogrel  (PLAVIX ) 75 MG tablet Take 1 tablet (75 mg total) by mouth daily. 30 tablet 11   Evolocumab  (REPATHA  SURECLICK) 140 MG/ML SOAJ ADMINISTER 1 ML UNDER THE SKIN EVERY 14 DAYS 6 mL 1   feeding supplement, ENSURE ENLIVE, (ENSURE ENLIVE) LIQD Take 237 mLs by mouth 2 (two) times daily between meals. 237 mL 0    folic acid  (FOLVITE ) 1 MG tablet Take 1 mg by mouth daily.     furosemide  (LASIX ) 20 MG tablet Take 2 tablets (40 mg total) by mouth daily. takes 2 tablets in the morning then take 1 tablet by mouth every evening.     gabapentin  (NEURONTIN ) 300 MG capsule Take 300 mg by mouth 2 (two) times daily.     Hypromellose (ARTIFICIAL TEARS OP) Place 1 drop into both eyes 2 (two) times daily.     JARDIANCE  10 MG TABS tablet TAKE 1 TABLET(10 MG) BY MOUTH DAILY BEFORE BREAKFAST 90 tablet 1   L-Methylfolate-B12-B6-B2 (CEREFOLIN) 12-31-48-5 MG TABS Take 1 tablet by mouth daily. 90 tablet 3   levothyroxine  (SYNTHROID , LEVOTHROID) 25 MCG tablet Take 25 mcg by mouth daily before breakfast.     memantine  (NAMENDA ) 10 MG tablet TAKE 1 TABLET(10 MG) BY MOUTH TWICE DAILY 60 tablet 3   pantoprazole  (PROTONIX ) 40 MG tablet Take 1 tablet (40 mg total) by mouth daily. 90 tablet 3   potassium chloride  SA (KLOR-CON ) 20 MEQ tablet Take 1 tablet (20 mEq total) by mouth daily. 60 tablet 0   Tafamidis  (VYNDAMAX ) 61 MG CAPS Take 1 capsule (61 mg total) by mouth daily. 30 capsule 11   Vitamin A 3 MG (10000 UT) TABS Take 3 mg by mouth daily.     vutrisiran  sodium (AMVUTTRA ) 25 MG/0.5ML syringe Inject 0.5 mLs (25 mg total) into the skin every 3 (three) months. 0.5 mL 3   No current facility-administered medications on file prior to visit.    Allergies:   Allergies  Allergen Reactions   Crestor  [Rosuvastatin ] Other (See Comments)    Drug interaction w/ tafamidis , caused rhabdomyolysis with CK > 29,000 in 2021   Tramadol  Shortness Of Breath and Palpitations    Physical Exam General: well developed, well nourished pleasant elderly African-American lady, seated, in no evident distress Head: head normocephalic and atraumatic.   Neck: supple with no carotid or supraclavicular bruits Cardiovascular: regular rate and rhythm, no murmurs Musculoskeletal: no deformity Skin:  no rash/petichiae Vascular:  Normal pulses all  extremities  Neurologic Exam Mental Status: Awake and fully alert. Oriented  to place and time. Recent and remote memory poor. Attention span, concentration and fund of knowledge diminished. Mood and affect appropriate.  Recall 2/3.  Clock drawing 3/4.  Able to name only 9 animals which can walk on 4 legs.  Mental status exam scored 24/30. Cranial Nerves: Fundoscopic exam not done pupils equal, briskly reactive to light. Extraocular movements full without nystagmus. Visual fields full to confrontation. Hearing intact. Facial sensation intact. Face, tongue, palate moves normally and symmetrically.  Motor: Normal bulk and tone. Normal strength in all tested extremity muscles.  Diminished fine finger movements on the right.  Orbits left or right upper extremity. Sensory.: intact to touch , pinprick , position and vibratory sensation.  Coordination: Rapid alternating movements normal in all extremities. Finger-to-nose and heel-to-shin performed accurately bilaterally. Gait and Station: Arises from chair without difficulty. Stance is normal. Gait is broad-based and uses a walker.  Unsteady when standing on either foot unsupported and narrow-based.  Unable to walk tandem. Reflexes: 1+ and symmetric. Toes downgoing.     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     PLAN:I had a long discussion with the patient  regarding her memory loss and mild dementia which appears improved on Namenda  and answered questions.  I recommend she continue Namenda  as she seems to be tolerating it well and has noticed some improvement.   .  Continue folic acid  for elevated homocystine but increase dose to 2 mg daily as patient is unable to afford Cerefolin NAC.SABRA  Encouraged her to continue participation in cognitively challenging activities like solving crossword puzzles, playing bridge and sudoku.  Continue Plavix  for stroke prevention and maintain aggressive risk factor modification.   Return for follow-up in the future only as needed and no scheduled appointment was made.   I personally spent a total of 35 minutes in the care of the patient today including getting/reviewing separately obtained history, performing a medically appropriate exam/evaluation, counseling and educating, placing orders, referring and communicating with other health care professionals, documenting clinical information in the EHR, independently interpreting results, and coordinating care.        Eather Popp, MD Note: This document was prepared with digital dictation and possible smart phrase technology. Any transcriptional errors that result from this process are unintentional.

## 2024-04-04 NOTE — Patient Instructions (Signed)
 had a long discussion with the patient  regarding her memory loss and mild dementia which appears improved on Namenda  and answered questions.  I recommend she continue Namenda  as she seems to be tolerating it well and has noticed some improvement.   .  Continue folic acid  for elevated homocystine but increase dose to 2 mg daily as patient is unable to afford Cerefolin NAC.SABRA  Encouraged her to continue participation in cognitively challenging activities like solving crossword puzzles, playing bridge and sudoku.  Continue Plavix  for stroke prevention and maintain aggressive risk factor modification.   Return for follow-up in the future only as needed and no scheduled appointment was made.

## 2024-04-05 ENCOUNTER — Other Ambulatory Visit: Payer: Self-pay | Admitting: Neurology

## 2024-04-09 ENCOUNTER — Other Ambulatory Visit: Payer: Self-pay

## 2024-04-09 ENCOUNTER — Other Ambulatory Visit (HOSPITAL_COMMUNITY): Payer: Self-pay

## 2024-04-09 ENCOUNTER — Other Ambulatory Visit (HOSPITAL_COMMUNITY): Payer: Self-pay | Admitting: Pharmacist

## 2024-04-09 MED ORDER — AMVUTTRA 25 MG/0.5ML ~~LOC~~ SOSY
25.0000 mg | PREFILLED_SYRINGE | SUBCUTANEOUS | 3 refills | Status: AC
  Filled 2024-04-09: qty 0.5, 90d supply, fill #0
  Filled 2024-07-09: qty 0.5, 90d supply, fill #1

## 2024-04-10 ENCOUNTER — Other Ambulatory Visit (HOSPITAL_COMMUNITY): Payer: Self-pay

## 2024-04-10 ENCOUNTER — Other Ambulatory Visit: Payer: Self-pay

## 2024-04-10 NOTE — Progress Notes (Signed)
 Specialty Pharmacy Refill Coordination Note  Katie [name] is a 78 y.o. female contacted today regarding refills of specialty medication(s) Tafamidis  (Vyndamax )   Patient requested Delivery   Delivery date: 04/12/24   Verified address: [address] [place] [address]   Medication will be filled on 09.10.25.

## 2024-04-11 ENCOUNTER — Other Ambulatory Visit: Payer: Self-pay

## 2024-04-12 ENCOUNTER — Other Ambulatory Visit (HOSPITAL_COMMUNITY)

## 2024-04-12 NOTE — Telephone Encounter (Signed)
 Cld Pt to discuss increase of folic acid  to 2 mg daily. Pt stated she recalls Dr. Rosemarie discussing that with her at her visit on 04/04/24 but Pt stated she has enough medication at this time to take 2 tabs daily. Informed Pt will send new script to [facility] to reflect the new order. Pt voiced understanding and thanks for the call.

## 2024-04-16 ENCOUNTER — Ambulatory Visit (HOSPITAL_COMMUNITY)
Admission: RE | Admit: 2024-04-16 | Discharge: 2024-04-16 | Disposition: A | Discharge status: Home / Self Care | Source: Ambulatory Visit | Attending: Internal Medicine | Admitting: Internal Medicine

## 2024-04-16 DIAGNOSIS — G5603 Carpal tunnel syndrome, bilateral upper limbs: Secondary | ICD-10-CM | POA: Diagnosis not present

## 2024-04-16 DIAGNOSIS — E854 Organ-limited amyloidosis: Secondary | ICD-10-CM | POA: Insufficient documentation

## 2024-04-16 DIAGNOSIS — I5032 Chronic diastolic (congestive) heart failure: Secondary | ICD-10-CM | POA: Diagnosis not present

## 2024-04-16 DIAGNOSIS — E039 Hypothyroidism, unspecified: Secondary | ICD-10-CM | POA: Diagnosis not present

## 2024-04-16 DIAGNOSIS — M1612 Unilateral primary osteoarthritis, left hip: Secondary | ICD-10-CM | POA: Insufficient documentation

## 2024-04-16 DIAGNOSIS — E851 Neuropathic heredofamilial amyloidosis: Secondary | ICD-10-CM | POA: Insufficient documentation

## 2024-04-16 DIAGNOSIS — G629 Polyneuropathy, unspecified: Secondary | ICD-10-CM | POA: Insufficient documentation

## 2024-04-16 DIAGNOSIS — Z8673 Personal history of transient ischemic attack (TIA), and cerebral infarction without residual deficits: Secondary | ICD-10-CM | POA: Insufficient documentation

## 2024-04-16 DIAGNOSIS — I43 Cardiomyopathy in diseases classified elsewhere: Secondary | ICD-10-CM | POA: Insufficient documentation

## 2024-04-16 DIAGNOSIS — N183 Chronic kidney disease, stage 3 unspecified: Secondary | ICD-10-CM | POA: Diagnosis not present

## 2024-04-16 DIAGNOSIS — Z79899 Other long term (current) drug therapy: Secondary | ICD-10-CM | POA: Diagnosis not present

## 2024-04-16 DIAGNOSIS — Z7982 Long term (current) use of aspirin: Secondary | ICD-10-CM | POA: Diagnosis not present

## 2024-04-16 DIAGNOSIS — G63 Polyneuropathy in diseases classified elsewhere: Secondary | ICD-10-CM | POA: Insufficient documentation

## 2024-04-16 DIAGNOSIS — I340 Nonrheumatic mitral (valve) insufficiency: Secondary | ICD-10-CM | POA: Diagnosis not present

## 2024-04-16 DIAGNOSIS — E785 Hyperlipidemia, unspecified: Secondary | ICD-10-CM | POA: Diagnosis not present

## 2024-04-16 DIAGNOSIS — Z7984 Long term (current) use of oral hypoglycemic drugs: Secondary | ICD-10-CM | POA: Insufficient documentation

## 2024-04-16 MED ORDER — VUTRISIRAN SODIUM 25 MG/0.5ML ~~LOC~~ SOSY
25.0000 mg | PREFILLED_SYRINGE | Freq: Once | SUBCUTANEOUS | Status: AC
  Administered 2024-04-16: 25 mg via SUBCUTANEOUS

## 2024-04-16 NOTE — Patient Instructions (Signed)
 It was a pleasure seeing you today!  MEDICATIONS: -No medication changes today -Call if you have questions about your medications.   NEXT APPOINTMENT: Return to clinic in 3 months for repeat Amvuttra .   Call the clinic at [phone] with questions or to reschedule future appointments.

## 2024-04-16 NOTE — Progress Notes (Signed)
 Advanced Heart Failure Clinic Note  PCP: Dr. Rexanne HF Cardiology: Dr. Rolan  HPI:  78 y.o. with history of chronic diastolic CHF, mitral regurgitation, CVA, and CKD returns for followup of CHF.    Patient was initially admitted with diastolic CHF in 01/2016. Echo showed EF 60-65% with grade 3 diastolic dysfunction and moderate MR.  She had cath showing no coronary disease. She was admitted in 01/2017 with left cerebellar CVA and LINQ monitor was placed.  TEE this admission showed moderate-severe MR.  She was admitted again in 05/2017 with acute on chronic diastolic CHF. Echo this admission showed EF 50-55%, severe LVH, restrictive diastolic dysfunction, severe MR, moderate-severe TR.  She was referred to Dr. Dusty who saw her for mitral valve evaluation.  Upon review of studies at that time, regurgitation thought to be more in the moderate range and likely functional.    Patient had PYP amyloid scan in 07/2017, this was suggestive of transthyretin amyloidosis.  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    Admission for A/C diastolic HF 2/6-2/05/2018. Required IV diuresis, then transitioned to 80 mg PO Lasix  BID. Metop was DC'd due to low blood pressures. Cardiac MRI strongly suggestive of amyloidosis, moderate to severe MR (moderate by regurgitant fraction 41% but severe visually). Echo repeated: EF 40-45%, moderate to severe LVH, severe MR, severe dilation of LA and RA, moderate TR, PASP 49.   She had Mitraclip x 2 placed in 11/2017.  Repeat echo post-mitraclip showed EF 55-60%, moderate LVH, mean gradient 3 mmHg across the MV with trace-mild MR. Cardiomems was placed 12/2017.     Echo 01/2019 showed EF 55% with moderate LVH, s/p Mitraclip with mean gradient 3 mmHg, mild MR, normal RV, PASP 41 mmHg. Echo in 06/2020 showed EF 50-55%, moderate LVH, normal RV, PASP 44 mmHg, s/p Mitraclip with mean gradient 5 mmHg with mild MR.    Echo 09/2021 showed EF 55-60%, mild LVH, mild to  moderate MR post Mitraclip.  Echo in 10/2022 with EF 55%, mild LVH, normal RV, PASP 41 mmHg, s/p Mitraclip with mild-moderate MR (mean gradient 3 mmHg).    S/p L hip arthroplasty 05/2023.   She returned for HF/amyloidosis follow up with MD on 10/27/23. Overall, reported feeling okay. Her weight was down 8 lbs. Cardiomems PADP was 14 (goal 19). After this appointment, furosemide  was decreased to 40 mg daily.   Echo on 11/2023 showed EF 55%, moderate LVH, mild to moderate MR s/p Mitraclip (mean gradient 3 mmHg), normal RV, PASP 50 mmHg.  Today she returns to HF clinic for administration of Amvuttra  (vutrisiran ). Overall feeling well today. No contraindications to injection today. Injection administered into L arm. Overall tolerated injection well. Small, raised bump at injection site.    Assessment/Plan: 1. Chronic diastolic CHF: Normal EF on 05/2017 echo with severe LVH and speckled myocardium consistent with cardiac amyloidosis.  SPEP was negative. 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 Cardiac MRI also strongly suggestive of cardiac amyloidosis. Echo 10/2202 showed EF 55%, mild LVH.   -NYHA class II symptoms.   - Continue Lasix  40 daily and potassium chloride  20 mEq daily.  - Echo on 11/18/23 with EF 55%, moderate LVH.  - Continue carvedilol  6.25 mg twice daily - Continue Jardiance  10 mg daily.   2. Mitral regurgitation: Mitral regurgitation seems to fluctuate somewhat based on loading conditions.  Most recently, echo in 09/2017 suggested severe MR and cardiac MRI suggested  moderate to severe MR (moderate by 41% regurgitant fraction but visually severe). Suspect that the etiology is mixed - functional as well as degenerative with thickening and restriction of posterior leaflet. She is now s/p Mitraclip procedure in 11/2017. Echo in 06/2020 showed mean gradient 5 mmHg across the MV with  Mitraclip in place, MR is mild. Echo 10/2022 showed mild to moderate MR with mean gradient 3 mmHg, stable. - Most recent echo on 11/18/23 with mild to moderate MR, mean gradient 3 mmHg, stable  3. CKD: Stage III.  - Continue Jardiance  10 mg daily  4. CVA: 01/2017. Has LINQ monitor, no atrial fibrillation detected so far.  - Continue aspirin  81 mg daily - Continue clopidogrel  75 mg daily  5. 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  Neuropathic pain has improved with Amvuttra .  - Continue Amvuttra  (vutrisiran ) 25 mg every 3 months  6. Cardiac amyloidosis: Hereditary TTR amyloidosis.  She has peripheral neuropathy as well as bilateral carpal tunnel syndrome.   - Continue Vyndamax  (tafamadis) 61 mg daily - Amvuttra  (vutrisiran ) injection administered in clinic today. Patient tolerated injection well. Provided patient counseling on Amvuttra . Most common side effects are injection site reactions, arthralgias and dyspnea. Patient is aware to return to clinic every 3 months for repeat injection. Amvuttra  [facility] be [facility] from [facility] and couriered to clinic for injection.  - Continue vitamin A supplement. Amvuttra  decreases serum vitamin A levels  7. Hyperlipidemia: Goal LDL < 70 with CVA.  Cannot take statins due to rhabdomyolysis with Crestor  (interaction with tafamidis ). Goal LDL < 55.  - Continue Repatha  (evolocumab ) 140 mg every 2 weeks - Continue Nexlizet  (bempedoic acid -ezetimibe ) 180-10 mg daily  - She is followed by Lipid Clinic  8. Osteoarthritis: s/p left hip arthroplasty.   9. Goiter: She has hypothyroidism and is on Levoxyl .   Repeat Amvuttra  injection in 3 months.   Tinnie Redman, PharmD, BCPS, BCCP, CPP Heart Failure Clinic Pharmacist [phone]

## 2024-05-01 DIAGNOSIS — M25579 Pain in unspecified ankle and joints of unspecified foot: Secondary | ICD-10-CM | POA: Diagnosis not present

## 2024-05-01 DIAGNOSIS — Z23 Encounter for immunization: Secondary | ICD-10-CM | POA: Diagnosis not present

## 2024-05-07 ENCOUNTER — Other Ambulatory Visit: Payer: Self-pay

## 2024-05-07 NOTE — Progress Notes (Signed)
 Specialty Pharmacy Refill Coordination Note  Katie [name] is a 78 y.o. female contacted today regarding refills of specialty medication(s) Tafamidis  (Vyndamax )   Patient requested Delivery   Delivery date: 05/09/24   Verified address: [address] [place] [address]   Medication will be filled on 05/08/24.

## 2024-05-09 ENCOUNTER — Other Ambulatory Visit: Payer: Self-pay

## 2024-05-17 ENCOUNTER — Other Ambulatory Visit: Payer: Self-pay

## 2024-05-18 ENCOUNTER — Other Ambulatory Visit (HOSPITAL_COMMUNITY): Payer: Self-pay | Admitting: Cardiology

## 2024-05-18 DIAGNOSIS — I5032 Chronic diastolic (congestive) heart failure: Secondary | ICD-10-CM

## 2024-05-22 DIAGNOSIS — N184 Chronic kidney disease, stage 4 (severe): Secondary | ICD-10-CM | POA: Diagnosis not present

## 2024-05-22 DIAGNOSIS — I43 Cardiomyopathy in diseases classified elsewhere: Secondary | ICD-10-CM | POA: Diagnosis not present

## 2024-05-22 DIAGNOSIS — I5032 Chronic diastolic (congestive) heart failure: Secondary | ICD-10-CM | POA: Diagnosis not present

## 2024-05-22 DIAGNOSIS — E854 Organ-limited amyloidosis: Secondary | ICD-10-CM | POA: Diagnosis not present

## 2024-05-22 DIAGNOSIS — C641 Malignant neoplasm of right kidney, except renal pelvis: Secondary | ICD-10-CM | POA: Diagnosis not present

## 2024-05-22 DIAGNOSIS — E785 Hyperlipidemia, unspecified: Secondary | ICD-10-CM | POA: Diagnosis not present

## 2024-05-22 DIAGNOSIS — I129 Hypertensive chronic kidney disease with stage 1 through stage 4 chronic kidney disease, or unspecified chronic kidney disease: Secondary | ICD-10-CM | POA: Diagnosis not present

## 2024-05-22 DIAGNOSIS — I340 Nonrheumatic mitral (valve) insufficiency: Secondary | ICD-10-CM | POA: Diagnosis not present

## 2024-05-31 ENCOUNTER — Other Ambulatory Visit (HOSPITAL_COMMUNITY): Payer: Self-pay

## 2024-06-04 ENCOUNTER — Other Ambulatory Visit: Payer: Self-pay

## 2024-06-04 NOTE — Progress Notes (Signed)
 Specialty Pharmacy Refill Coordination Note  Katie [name] is a 78 y.o. female contacted today regarding refills of specialty medication(s) Tafamidis  (Vyndamax )   Patient requested Delivery   Delivery date: 06/08/24   Verified address: [address] [place] [address]   Medication will be filled on: 06/07/24

## 2024-06-06 ENCOUNTER — Other Ambulatory Visit: Payer: Self-pay

## 2024-06-08 ENCOUNTER — Other Ambulatory Visit: Payer: Self-pay

## 2024-06-08 ENCOUNTER — Telehealth: Payer: Self-pay | Admitting: Neurology

## 2024-06-08 ENCOUNTER — Emergency Department (HOSPITAL_COMMUNITY)

## 2024-06-08 ENCOUNTER — Encounter (HOSPITAL_COMMUNITY): Payer: Self-pay | Admitting: *Deleted

## 2024-06-08 ENCOUNTER — Emergency Department (HOSPITAL_COMMUNITY)
Admission: EM | Admit: 2024-06-08 | Discharge: 2024-06-09 | Disposition: A | Discharge status: Home / Self Care | Attending: Emergency Medicine | Admitting: Emergency Medicine

## 2024-06-08 DIAGNOSIS — Z7902 Long term (current) use of antithrombotics/antiplatelets: Secondary | ICD-10-CM | POA: Diagnosis not present

## 2024-06-08 DIAGNOSIS — M79672 Pain in left foot: Secondary | ICD-10-CM | POA: Diagnosis present

## 2024-06-08 DIAGNOSIS — I130 Hypertensive heart and chronic kidney disease with heart failure and stage 1 through stage 4 chronic kidney disease, or unspecified chronic kidney disease: Secondary | ICD-10-CM | POA: Insufficient documentation

## 2024-06-08 DIAGNOSIS — F0390 Unspecified dementia without behavioral disturbance: Secondary | ICD-10-CM | POA: Diagnosis not present

## 2024-06-08 DIAGNOSIS — N189 Chronic kidney disease, unspecified: Secondary | ICD-10-CM | POA: Diagnosis not present

## 2024-06-08 DIAGNOSIS — M10372 Gout due to renal impairment, left ankle and foot: Secondary | ICD-10-CM | POA: Insufficient documentation

## 2024-06-08 DIAGNOSIS — Z7982 Long term (current) use of aspirin: Secondary | ICD-10-CM | POA: Insufficient documentation

## 2024-06-08 DIAGNOSIS — I509 Heart failure, unspecified: Secondary | ICD-10-CM | POA: Diagnosis not present

## 2024-06-08 LAB — COMPREHENSIVE METABOLIC PANEL WITH GFR
ALT: 12 U/L (ref 0–44)
AST: 22 U/L (ref 15–41)
Albumin: 3.5 g/dL (ref 3.5–5.0)
Alkaline Phosphatase: 100 U/L (ref 38–126)
Anion gap: 14 (ref 5–15)
BUN: 28 mg/dL — ABNORMAL HIGH (ref 8–23)
CO2: 24 mmol/L (ref 22–32)
Calcium: 9.6 mg/dL (ref 8.9–10.3)
Chloride: 102 mmol/L (ref 98–111)
Creatinine, Ser: 2 mg/dL — ABNORMAL HIGH (ref 0.44–1.00)
GFR, Estimated: 25 mL/min — ABNORMAL LOW (ref >60)
Glucose, Bld: 113 mg/dL — ABNORMAL HIGH (ref 70–99)
Potassium: 3.8 mmol/L (ref 3.5–5.1)
Sodium: 140 mmol/L (ref 135–145)
Total Bilirubin: 0.9 mg/dL (ref 0.0–1.2)
Total Protein: 8.9 g/dL — ABNORMAL HIGH (ref 6.5–8.1)

## 2024-06-08 LAB — CBC WITH DIFFERENTIAL/PLATELET
Abs Immature Granulocytes: 0.01 K/uL (ref 0.00–0.07)
Basophils Absolute: 0.1 K/uL (ref 0.0–0.1)
Basophils Relative: 1 %
Eosinophils Absolute: 0 K/uL (ref 0.0–0.5)
Eosinophils Relative: 0 %
HCT: 38.6 % (ref 36.0–46.0)
Hemoglobin: 12.2 g/dL (ref 12.0–15.0)
Immature Granulocytes: 0 %
Lymphocytes Relative: 34 %
Lymphs Abs: 2.2 K/uL (ref 0.7–4.0)
MCH: 29.3 pg (ref 26.0–34.0)
MCHC: 31.6 g/dL (ref 30.0–36.0)
MCV: 92.6 fL (ref 80.0–100.0)
Monocytes Absolute: 0.8 K/uL (ref 0.1–1.0)
Monocytes Relative: 12 %
Neutro Abs: 3.6 K/uL (ref 1.7–7.7)
Neutrophils Relative %: 53 %
Platelets: 311 K/uL (ref 150–400)
RBC: 4.17 MIL/uL (ref 3.87–5.11)
RDW: 14.4 % (ref 11.5–15.5)
WBC: 6.7 K/uL (ref 4.0–10.5)
nRBC: 0 % (ref 0.0–0.2)

## 2024-06-08 MED ORDER — HYDROCODONE-ACETAMINOPHEN 5-325 MG PO TABS
1.0000 | ORAL_TABLET | Freq: Once | ORAL | Status: AC
  Administered 2024-06-08: 1 via ORAL
  Filled 2024-06-08: qty 1

## 2024-06-08 MED ORDER — ONDANSETRON 4 MG PO TBDP
4.0000 mg | ORAL_TABLET | Freq: Once | ORAL | Status: AC
  Administered 2024-06-08: 4 mg via ORAL
  Filled 2024-06-08: qty 1

## 2024-06-08 NOTE — ED Provider Triage Note (Signed)
 Emergency Medicine Provider Triage Evaluation Note  Katie [name] , a 78 y.o. female  was evaluated in triage.  Pt complains of left foot pain.  She has a history of cancer, CKD, hypertension.  She was seen recently by her primary care doctor for pain in her left foot and was treated for suspected gout with improvement however pain and swelling came back over the last couple of days she has had associated shaking chills rates her pain as severe she has no injuries..  Review of Systems  Positive: Foot pain Negative: Injury  Physical Exam  BP 123/74   Pulse 90   Temp 99.4 F (37.4 C)   Resp 18   Ht 5' 2 (1.575 m)   Wt 66.2 kg   LMP 02/12/2013   SpO2 97%   BMI 26.69 kg/m  Gen:   Awake, no distress   Resp:  Normal effort  MSK:   Moves extremities without difficulty  Other:  Left midfoot on the dorsal surface is erythematous swollen tender to palpation.  Normal cap refill and sensation  Medical Decision Making  Medically screening exam initiated at 7:22 PM.  Appropriate orders placed.  BRETTA FEES was informed that the remainder of the evaluation will be completed by another provider, this initial triage assessment does not replace that evaluation, and the importance of remaining in the ED until their evaluation is complete.     Arloa Chroman, PA-C 06/08/24 [id number]

## 2024-06-08 NOTE — ED Triage Notes (Signed)
 Patient bib daughter with complaints of a fever, aches, shob, cough, sore throat.  Daughter states has CHF and is shob all the time. Has a history of HTN.

## 2024-06-08 NOTE — Telephone Encounter (Addendum)
 Pt has called asking for a RN to call her to discuss that for a couple of days her left foot is swollen,pt confirmed that this is not what she see's Dr Rosemarie for, pt was advise that Dr Rosemarie can only see her for what the referral was for.  Pt will reach out to PCP, no call needed for POD

## 2024-06-08 NOTE — ED Triage Notes (Signed)
 Lt foot swollen  and painful for 2 days no known injuries

## 2024-06-09 MED ORDER — PREDNISONE 20 MG PO TABS
40.0000 mg | ORAL_TABLET | Freq: Once | ORAL | Status: AC
  Administered 2024-06-09: 40 mg via ORAL
  Filled 2024-06-09: qty 2

## 2024-06-09 MED ORDER — PREDNISONE 10 MG PO TABS: ORAL_TABLET | ORAL | 0 refills | Status: AC

## 2024-06-09 NOTE — ED Provider Notes (Signed)
 [facility] [name] EMERGENCY DEPARTMENT AT [facility] Provider Note   CSN: [id number] Arrival date & time: 06/08/24  1702     Patient presents with: No chief complaint on file.   Katie [name] is a 78 y.o. female.   The history is provided by the patient.   Katie [name] is a 78 y.o. female who presents to the Emergency Department complaining of foot pain.  She presents the emergency department for evaluation of pain and swelling to the midfoot for the last 2 days.  She does have pain on weightbearing.  She had a temperature to 99 today.  No reported injuries.  She has had a similar episode a few weeks ago was treated by her PCP for gout.  She does feel occasionally short of breath at baseline, not currently short of breath.  No current chest pain.  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  Her daughter is at the bedside, who assist with history taking.   Prior to Admission medications   Medication Sig Start Date End Date Taking? Authorizing Provider  predniSONE  (DELTASONE ) 10 MG tablet Take 4 tablets (40 mg total) by mouth daily for 5 days, THEN 3 tablets (30 mg total) daily for 2 days, THEN 2 tablets (20 mg total) daily for 2 days, THEN 1 tablet (10 mg total) daily for 2 days. 06/09/24 06/20/24 Yes Griselda Norris, MD  acetaminophen  (TYLENOL ) 500 MG tablet Take 1,000 mg by mouth daily.    [provider]  aspirin  81 MG tablet Take 1 tablet (81 mg total) daily by mouth. 06/14/17   Rolan Ezra RAMAN, MD  Bempedoic Acid -Ezetimibe  (NEXLIZET ) 180-10 MG TABS Take 1 tablet by mouth daily. 04/11/23   Rolan Ezra RAMAN, MD  carvedilol  (COREG ) 6.25 MG tablet TAKE 1 TABLET(6.25 MG) BY MOUTH TWICE DAILY WITH A MEAL 07/07/23   [place], [place], FNP  clopidogrel  (PLAVIX ) 75 MG tablet Take 1 tablet (75 mg total) by mouth daily. 03/13/24   Milford, Harlene HERO, FNP  Evolocumab  (REPATHA  SURECLICK) 140 MG/ML SOAJ ADMINISTER 1 ML UNDER THE SKIN EVERY 14 DAYS 03/20/24    Rolan Ezra RAMAN, MD  feeding supplement, ENSURE ENLIVE, (ENSURE ENLIVE) LIQD Take 237 mLs by mouth 2 (two) times daily between meals. 02/17/17   Vann, Jessica U, DO  folic acid  (FOLVITE ) 1 MG tablet Take 2 tablets (2 mg total) by mouth daily. 04/12/24   Rosemarie Eather RAMAN, MD  furosemide  (LASIX ) 20 MG tablet TAKE 2 TABLETS BY MOUTH IN THE MORNING THEN TAKE 1 TABLET BY MOUTH EVERY EVENING 05/22/24   Rolan Ezra RAMAN, MD  gabapentin  (NEURONTIN ) 300 MG capsule Take 300 mg by mouth 2 (two) times daily.    [provider]  Hypromellose (ARTIFICIAL TEARS OP) Place 1 drop into both eyes 2 (two) times daily.    [provider]  JARDIANCE  10 MG TABS tablet TAKE 1 TABLET(10 MG) BY MOUTH DAILY BEFORE BREAKFAST 02/07/24   McLean, Dalton S, MD  L-Methylfolate-B12-B6-B2 (CEREFOLIN) 12-31-48-5 MG TABS Take 1 tablet by mouth daily. 09/22/23   Rosemarie Eather RAMAN, MD  levothyroxine  (SYNTHROID , LEVOTHROID) 25 MCG tablet Take 25 mcg by mouth daily before breakfast.    [provider]  memantine  (NAMENDA ) 10 MG tablet TAKE 1 TABLET(10 MG) BY MOUTH TWICE DAILY 10/25/23   Sethi, Pramod S, MD  pantoprazole  (PROTONIX ) 40 MG tablet Take 1 tablet (40 mg total) by mouth daily. 03/16/24   Rolan Ezra RAMAN, MD  potassium chloride  SA (KLOR-CON ) 20 MEQ tablet Take 1 tablet (20 mEq total) by mouth daily. 06/16/20   Fairy Frames, MD  Tafamidis  (VYNDAMAX ) 61 MG CAPS Take 1 capsule (61 mg total) by mouth daily. 09/27/23   Rolan Ezra RAMAN, MD  Vitamin A 3 MG (10000 UT) TABS Take 3 mg by mouth daily.    [provider]  vutrisiran  sodium (AMVUTTRA ) 25 MG/0.5ML syringe Inject 0.5 mLs (25 mg total) into the skin every 3 (three) months. 04/09/24   Rolan Ezra RAMAN, MD    Allergies: Crestor  [rosuvastatin ] and Tramadol     Review of Systems  All other systems reviewed and are negative.   Updated Vital Signs BP 108/68   Pulse 81   Temp 97.9 F (36.6 C)   Resp 14   Ht 5' 2 (1.575 m)   Wt 66.2 kg   LMP  02/12/2013   SpO2 100%   BMI 26.69 kg/m   Physical Exam Vitals and nursing note reviewed.  Constitutional:      Appearance: She is well-developed.  HENT:     Head: Normocephalic and atraumatic.  Cardiovascular:     Rate and Rhythm: Normal rate and regular rhythm.  Pulmonary:     Effort: Pulmonary effort is normal. No respiratory distress.  Abdominal:     Palpations: Abdomen is soft.     Tenderness: There is no abdominal tenderness. There is no guarding or rebound.  Musculoskeletal:     Comments: Moderate soft tissue swelling and tenderness to the left mid foot.  No edema to the distal lower extremities.  2+ DP pulses bilaterally  Skin:    General: Skin is warm and dry.  Neurological:     Mental Status: She is alert and oriented to person, place, and time.  Psychiatric:        Behavior: Behavior normal.     (all labs ordered are listed, but only abnormal results are displayed) Labs Reviewed  COMPREHENSIVE METABOLIC PANEL WITH GFR - Abnormal; Notable for the following components:      Result Value   Glucose, Bld 113 (*)    BUN 28 (*)    Creatinine, Ser 2.00 (*)    Total Protein 8.9 (*)    GFR, Estimated 25 (*)    All other components within normal limits  CBC WITH DIFFERENTIAL/PLATELET    EKG: None  Radiology: DG Foot 2 Views Left Result Date: 06/08/2024 EXAM: 2 VIEW(S) XRAY OF THE LEFT FOOT 06/08/2024 08:11:00 PM COMPARISON: None available. CLINICAL HISTORY: foot pain FINDINGS: BONES AND JOINTS: Small plantar calcaneal spur. Tiny bony density is noted along the dorsal aspect of the talus without soft tissue swelling, likely related to chronic avulsion. No acute fracture. No joint dislocation. SOFT TISSUES: Mild forefoot soft tissue swelling. IMPRESSION: 1. Tiny bony density along the dorsal aspect of the talus, likely chronic avulsion, without associated soft tissue swelling. 2. Mild forefoot soft tissue swelling. 3. Small plantar calcaneal spur. Electronically signed  by: Oneil Devonshire MD 06/08/2024 08:22 PM EST RP Workstation: [id number]     Procedures   Medications Ordered in the ED  HYDROcodone -acetaminophen  (NORCO/VICODIN) 5-325 MG per tablet 1 tablet (1 tablet Oral Given 06/08/24 1934)  ondansetron  (ZOFRAN -ODT) disintegrating tablet 4 mg (4 mg Oral Given 06/08/24 1934)  predniSONE  (DELTASONE ) tablet 40 mg (40 mg Oral Given 06/09/24 0515)  Medical Decision Making Risk Prescription drug management.   Patient here for evaluation of foot swelling and pain.  She does have soft tissue swelling and tenderness on examination, no evidence of abscess.  Current picture is not consistent with cellulitis or septic arthritis.  Suspect that she has gout.  She has stable renal insufficiency on her labs.  X-ray does show soft tissue swelling to the foot.  CBC is unremarkable.  Discussed with patient home care for gout.  Given her renal function will treat with prednisone .  Given recent recurrent episode will do a taper of the prednisone .  Discussed need for PCP follow-up as well as return precautions for evidence of infections or new concerning symptoms.     Final diagnoses:  Acute gout due to renal impairment involving left foot    ED Discharge Orders          Ordered    Post-op shoe        06/09/24 0503    predniSONE  (DELTASONE ) 10 MG tablet  Daily        06/09/24 0507               Griselda Norris, MD 06/09/24 [phone]

## 2024-06-09 NOTE — ED Notes (Signed)
 Pt and family verbalized understanding of discharge instructions. Pt ambulitory at time of discharge.

## 2024-06-21 ENCOUNTER — Other Ambulatory Visit: Payer: Self-pay

## 2024-06-25 ENCOUNTER — Ambulatory Visit: Admitting: Neurology

## 2024-06-26 ENCOUNTER — Other Ambulatory Visit

## 2024-06-27 ENCOUNTER — Other Ambulatory Visit: Payer: Self-pay

## 2024-07-02 ENCOUNTER — Other Ambulatory Visit: Payer: Self-pay

## 2024-07-02 NOTE — Progress Notes (Signed)
 Specialty Pharmacy Refill Coordination Note  Katie [name] is a 78 y.o. female contacted today regarding refills of specialty medication(s) Tafamidis  (Vyndamax )   Patient requested Delivery   Delivery date: 07/06/24   Verified address: [address] [place] [address]   Medication will be filled on: 07/05/24

## 2024-07-05 ENCOUNTER — Other Ambulatory Visit: Payer: Self-pay

## 2024-07-09 ENCOUNTER — Other Ambulatory Visit: Payer: Self-pay | Admitting: Pharmacy Technician

## 2024-07-09 ENCOUNTER — Other Ambulatory Visit: Payer: Self-pay

## 2024-07-09 NOTE — Progress Notes (Signed)
 Specialty Pharmacy Refill Coordination Note  Katie [name] is a 78 y.o. female contacted today regarding refills of specialty medication(s) Vutrisiran  Sodium (Amvuttra )   Patient requested Courier to Provider Office   Delivery date: 07/12/24   Verified address: [facility] [address]   Medication will be filled on: 07/11/24

## 2024-07-11 ENCOUNTER — Other Ambulatory Visit: Payer: Self-pay

## 2024-07-12 NOTE — Progress Notes (Signed)
 Medication has been picked up and is available in office for upcoming appointment.

## 2024-07-16 ENCOUNTER — Ambulatory Visit (HOSPITAL_COMMUNITY)
Admission: RE | Admit: 2024-07-16 | Discharge: 2024-07-16 | Disposition: A | Source: Ambulatory Visit | Attending: Cardiology

## 2024-07-16 DIAGNOSIS — Z7982 Long term (current) use of aspirin: Secondary | ICD-10-CM | POA: Diagnosis not present

## 2024-07-16 DIAGNOSIS — E039 Hypothyroidism, unspecified: Secondary | ICD-10-CM | POA: Diagnosis not present

## 2024-07-16 DIAGNOSIS — E854 Organ-limited amyloidosis: Secondary | ICD-10-CM

## 2024-07-16 DIAGNOSIS — I43 Cardiomyopathy in diseases classified elsewhere: Secondary | ICD-10-CM

## 2024-07-16 DIAGNOSIS — G5603 Carpal tunnel syndrome, bilateral upper limbs: Secondary | ICD-10-CM | POA: Diagnosis not present

## 2024-07-16 DIAGNOSIS — N183 Chronic kidney disease, stage 3 unspecified: Secondary | ICD-10-CM | POA: Diagnosis not present

## 2024-07-16 DIAGNOSIS — Z7984 Long term (current) use of oral hypoglycemic drugs: Secondary | ICD-10-CM | POA: Diagnosis not present

## 2024-07-16 DIAGNOSIS — M1612 Unilateral primary osteoarthritis, left hip: Secondary | ICD-10-CM | POA: Diagnosis not present

## 2024-07-16 DIAGNOSIS — I340 Nonrheumatic mitral (valve) insufficiency: Secondary | ICD-10-CM | POA: Diagnosis not present

## 2024-07-16 DIAGNOSIS — E785 Hyperlipidemia, unspecified: Secondary | ICD-10-CM | POA: Diagnosis not present

## 2024-07-16 DIAGNOSIS — E049 Nontoxic goiter, unspecified: Secondary | ICD-10-CM | POA: Diagnosis not present

## 2024-07-16 DIAGNOSIS — Z8673 Personal history of transient ischemic attack (TIA), and cerebral infarction without residual deficits: Secondary | ICD-10-CM | POA: Diagnosis not present

## 2024-07-16 DIAGNOSIS — G629 Polyneuropathy, unspecified: Secondary | ICD-10-CM | POA: Diagnosis not present

## 2024-07-16 DIAGNOSIS — I5032 Chronic diastolic (congestive) heart failure: Secondary | ICD-10-CM | POA: Diagnosis present

## 2024-07-16 DIAGNOSIS — Z79899 Other long term (current) drug therapy: Secondary | ICD-10-CM | POA: Diagnosis not present

## 2024-07-16 MED ORDER — VUTRISIRAN SODIUM 25 MG/0.5ML ~~LOC~~ SOSY
25.0000 mg | PREFILLED_SYRINGE | Freq: Once | SUBCUTANEOUS | Status: AC
  Administered 2024-07-16: 13:00:00 25 mg via SUBCUTANEOUS

## 2024-07-16 NOTE — Progress Notes (Signed)
 Advanced Heart Failure Clinic Note  PCP: Dr. Rexanne HF Cardiology: Dr. Rolan  HPI:  78 y.o. with history of chronic diastolic CHF, mitral regurgitation, CVA, and CKD returns for followup of CHF.    Patient was initially admitted with diastolic CHF in 01/2016. Echo showed EF 60-65% with grade 3 diastolic dysfunction and moderate MR.  She had cath showing no coronary disease. She was admitted in 01/2017 with left cerebellar CVA and LINQ monitor was placed.  TEE this admission showed moderate-severe MR.  She was admitted again in 05/2017 with acute on chronic diastolic CHF. Echo this admission showed EF 50-55%, severe LVH, restrictive diastolic dysfunction, severe MR, moderate-severe TR.  She was referred to Dr. Dusty who saw her for mitral valve evaluation.  Upon review of studies at that time, regurgitation thought to be more in the moderate range and likely functional.    Patient had PYP amyloid scan in 07/2017, this was suggestive of transthyretin amyloidosis.  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    Admission for A/C diastolic HF 2/6-2/05/2018. Required IV diuresis, then transitioned to 80 mg PO Lasix  BID. Metop was DC'd due to low blood pressures. Cardiac MRI strongly suggestive of amyloidosis, moderate to severe MR (moderate by regurgitant fraction 41% but severe visually). Echo repeated: EF 40-45%, moderate to severe LVH, severe MR, severe dilation of LA and RA, moderate TR, PASP 49.   She had Mitraclip x 2 placed in 11/2017.  Repeat echo post-mitraclip showed EF 55-60%, moderate LVH, mean gradient 3 mmHg across the MV with trace-mild MR. Cardiomems was placed 12/2017.     Echo 01/2019 showed EF 55% with moderate LVH, s/p Mitraclip with mean gradient 3 mmHg, mild MR, normal RV, PASP 41 mmHg. Echo in 06/2020 showed EF 50-55%, moderate LVH, normal RV, PASP 44 mmHg, s/p Mitraclip with mean gradient 5 mmHg with mild MR.    Echo 09/2021 showed EF 55-60%, mild LVH, mild to  moderate MR post Mitraclip.  Echo in 10/2022 with EF 55%, mild LVH, normal RV, PASP 41 mmHg, s/p Mitraclip with mild-moderate MR (mean gradient 3 mmHg).    S/p L hip arthroplasty 05/2023.   She returned for HF/amyloidosis follow up with MD on 10/27/23. Overall, reported feeling okay. Her weight was down 8 lbs. Cardiomems PADP was 14 (goal 19). After this appointment, furosemide  was decreased to 40 mg daily.   Echo on 11/2023 showed EF 55%, moderate LVH, mild to moderate MR s/p Mitraclip (mean gradient 3 mmHg), normal RV, PASP 50 mmHg.  Today she returns to HF clinic for administration of Amvuttra  (vutrisiran ). Overall feeling well today. No contraindications to injection today. Injection administered into L arm. Overall tolerated injection well.    Assessment/Plan: 1. Chronic diastolic CHF: Normal EF on 05/2017 echo with severe LVH and speckled myocardium consistent with cardiac amyloidosis.  SPEP was negative. 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 Cardiac MRI also strongly suggestive of cardiac amyloidosis. Echo 10/2202 showed EF 55%, mild LVH.   -NYHA class II symptoms.   - Continue Lasix  40 daily and potassium chloride  20 mEq daily.  - Echo on 11/18/23 with EF 55%, moderate LVH.  - Continue carvedilol  6.25 mg twice daily - Continue Jardiance  10 mg daily.   2. Mitral regurgitation: Mitral regurgitation seems to fluctuate somewhat based on loading conditions.  Most recently, echo in 09/2017 suggested severe MR and cardiac MRI suggested moderate to severe MR (moderate by  41% regurgitant fraction but visually severe). Suspect that the etiology is mixed - functional as well as degenerative with thickening and restriction of posterior leaflet. She is now s/p Mitraclip procedure in 11/2017. Echo in 06/2020 showed mean gradient 5 mmHg across the MV with Mitraclip in place, MR is mild. Echo 10/2022  showed mild to moderate MR with mean gradient 3 mmHg, stable. - Most recent echo on 11/18/23 with mild to moderate MR, mean gradient 3 mmHg, stable  3. CKD: Stage III.  - Continue Jardiance  10 mg daily  4. CVA: 01/2017. Has LINQ monitor, no atrial fibrillation detected so far.  - Continue aspirin  81 mg daily - Continue clopidogrel  75 mg daily  5. 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  Neuropathic pain has improved with Amvuttra .  - Continue Amvuttra  (vutrisiran ) 25 mg every 3 months  6. Cardiac amyloidosis: Hereditary TTR amyloidosis.  She has peripheral neuropathy as well as bilateral carpal tunnel syndrome.   - Continue Vyndamax  (tafamadis) 61 mg daily - Amvuttra  (vutrisiran ) injection administered in clinic today. Patient tolerated injection well. Provided patient counseling on Amvuttra . Most common side effects are injection site reactions, arthralgias and dyspnea. Patient is aware to return to clinic every 3 months for repeat injection. Amvuttra  will be obtained from [facility] and couriered to clinic for injection.  - Patient was not taking vitamin A supplement. Amvuttra  decreases serum vitamin A levels. Start 700-900 mcg of vitamin A daily.  7. Hyperlipidemia: Goal LDL < 70 with CVA.  Cannot take statins due to rhabdomyolysis with Crestor  (interaction with tafamidis ). Goal LDL < 55.  - Continue Repatha  (evolocumab ) 140 mg every 2 weeks - Continue Nexlizet  (bempedoic acid -ezetimibe ) 180-10 mg daily  - She is followed by [facility]  8. Osteoarthritis: s/p left hip arthroplasty.   9. Goiter: She has hypothyroidism and is on Levoxyl .   Repeat Amvuttra  injection in 3 months.  Please do not hesitate to reach out with questions or concerns,  Jaun Bash, PharmD, CPP, BCPS, [facility] Heart Failure Pharmacist  Phone - [phone] 07/16/2024 1:19 PM

## 2024-07-16 NOTE — Patient Instructions (Signed)
 It was a pleasure seeing you today!  Start taking vitamin A supplement of 700-900 mcg daily, which can be found over the counter or in a multivitamin.  Please return for your next injection on 10/17/23.

## 2024-07-16 NOTE — Addendum Note (Signed)
 Encounter addended by: Delsie Jaun RAMAN, RPH-CPP on: 07/16/2024 2:30 PM  Actions taken: Charge Capture section accepted

## 2024-07-25 ENCOUNTER — Other Ambulatory Visit: Payer: Self-pay

## 2024-07-28 ENCOUNTER — Other Ambulatory Visit: Payer: Self-pay | Admitting: Neurology

## 2024-07-31 ENCOUNTER — Other Ambulatory Visit: Payer: Self-pay

## 2024-07-31 ENCOUNTER — Other Ambulatory Visit (HOSPITAL_COMMUNITY): Payer: Self-pay

## 2024-08-03 ENCOUNTER — Other Ambulatory Visit: Payer: Self-pay

## 2024-08-03 NOTE — Progress Notes (Signed)
 Specialty Pharmacy Refill Coordination Note  Katie [name] is a 79 y.o. female contacted today regarding refills of specialty medication(s) Tafamidis  (Vyndamax )   Patient requested Delivery   Delivery date: 08/07/24   Verified address: [address]   [place] [address]   Medication will be filled on: 08/06/24

## 2024-08-06 ENCOUNTER — Other Ambulatory Visit: Payer: Self-pay

## 2024-08-16 ENCOUNTER — Telehealth (HOSPITAL_COMMUNITY): Payer: Self-pay | Admitting: Pharmacist

## 2024-08-16 NOTE — Telephone Encounter (Signed)
 Patient Advocate Encounter   Received notification from [employer] that prior authorization for Amvuttra  is required.   PA submitted on CoverMyMeds Key [id number] Status is pending   Will continue to follow.  Tinnie Redman, PharmD, BCPS, BCCP, CPP Heart Failure Clinic Pharmacist [phone]

## 2024-08-17 NOTE — Telephone Encounter (Signed)
 Advanced Heart Failure Patient Advocate Encounter  Prior Authorization for Amvuttra  has been approved.    PA# [id number] Effective dates: 08/16/24 through 08/01/25  Tinnie Redman, PharmD, BCPS, BCCP, CPP Heart Failure Clinic Pharmacist [phone]

## 2024-08-22 ENCOUNTER — Other Ambulatory Visit (HOSPITAL_COMMUNITY): Payer: Self-pay | Admitting: Cardiology

## 2024-08-22 MED ORDER — CARVEDILOL 6.25 MG PO TABS: 6.2500 mg | ORAL_TABLET | Freq: Two times a day (BID) | ORAL | 3 refills | Status: AC

## 2024-08-29 ENCOUNTER — Other Ambulatory Visit (HOSPITAL_COMMUNITY): Payer: Self-pay

## 2024-08-31 ENCOUNTER — Other Ambulatory Visit (HOSPITAL_COMMUNITY): Payer: Self-pay

## 2024-09-04 ENCOUNTER — Other Ambulatory Visit: Payer: Self-pay

## 2024-09-04 ENCOUNTER — Other Ambulatory Visit: Payer: Self-pay | Admitting: Cardiology

## 2024-09-04 DIAGNOSIS — E782 Mixed hyperlipidemia: Secondary | ICD-10-CM

## 2024-09-06 ENCOUNTER — Other Ambulatory Visit: Payer: Self-pay

## 2024-09-07 ENCOUNTER — Other Ambulatory Visit (HOSPITAL_COMMUNITY): Payer: Self-pay | Admitting: Cardiology

## 2024-10-16 ENCOUNTER — Ambulatory Visit (HOSPITAL_COMMUNITY)
